# Patient Record
Sex: Female | Born: 1935 | Race: White | Hispanic: No | Smoking: Never smoker
Health system: Southern US, Community
[De-identification: ages and names within clinical notes are randomized; demographics above are authoritative.]

## PROBLEM LIST (undated history)

## (undated) DIAGNOSIS — R5383 Other fatigue: Secondary | ICD-10-CM

## (undated) DIAGNOSIS — A048 Other specified bacterial intestinal infections: Secondary | ICD-10-CM

## (undated) DIAGNOSIS — F411 Generalized anxiety disorder: Secondary | ICD-10-CM

## (undated) DIAGNOSIS — I509 Heart failure, unspecified: Secondary | ICD-10-CM

## (undated) DIAGNOSIS — K222 Esophageal obstruction: Secondary | ICD-10-CM

## (undated) DIAGNOSIS — R198 Other specified symptoms and signs involving the digestive system and abdomen: Secondary | ICD-10-CM

## (undated) DIAGNOSIS — M47816 Spondylosis without myelopathy or radiculopathy, lumbar region: Secondary | ICD-10-CM

## (undated) DIAGNOSIS — E559 Vitamin D deficiency, unspecified: Secondary | ICD-10-CM

## (undated) DIAGNOSIS — T8859XA Other complications of anesthesia, initial encounter: Secondary | ICD-10-CM

## (undated) DIAGNOSIS — F329 Major depressive disorder, single episode, unspecified: Secondary | ICD-10-CM

## (undated) DIAGNOSIS — G479 Sleep disorder, unspecified: Secondary | ICD-10-CM

## (undated) DIAGNOSIS — K8020 Calculus of gallbladder without cholecystitis without obstruction: Secondary | ICD-10-CM

## (undated) DIAGNOSIS — H3530 Unspecified macular degeneration: Secondary | ICD-10-CM

## (undated) DIAGNOSIS — M1990 Unspecified osteoarthritis, unspecified site: Secondary | ICD-10-CM

## (undated) DIAGNOSIS — I6350 Cerebral infarction due to unspecified occlusion or stenosis of unspecified cerebral artery: Secondary | ICD-10-CM

## (undated) DIAGNOSIS — K5730 Diverticulosis of large intestine without perforation or abscess without bleeding: Secondary | ICD-10-CM

## (undated) DIAGNOSIS — M810 Age-related osteoporosis without current pathological fracture: Secondary | ICD-10-CM

## (undated) DIAGNOSIS — R0602 Shortness of breath: Secondary | ICD-10-CM

## (undated) DIAGNOSIS — Z9289 Personal history of other medical treatment: Secondary | ICD-10-CM

## (undated) DIAGNOSIS — R350 Frequency of micturition: Secondary | ICD-10-CM

## (undated) DIAGNOSIS — K644 Residual hemorrhoidal skin tags: Secondary | ICD-10-CM

## (undated) DIAGNOSIS — R112 Nausea with vomiting, unspecified: Secondary | ICD-10-CM

## (undated) DIAGNOSIS — K449 Diaphragmatic hernia without obstruction or gangrene: Secondary | ICD-10-CM

## (undated) DIAGNOSIS — Z9889 Other specified postprocedural states: Secondary | ICD-10-CM

## (undated) DIAGNOSIS — K219 Gastro-esophageal reflux disease without esophagitis: Secondary | ICD-10-CM

## (undated) DIAGNOSIS — R519 Headache, unspecified: Secondary | ICD-10-CM

## (undated) DIAGNOSIS — R002 Palpitations: Secondary | ICD-10-CM

## (undated) DIAGNOSIS — F3289 Other specified depressive episodes: Secondary | ICD-10-CM

## (undated) DIAGNOSIS — T4145XA Adverse effect of unspecified anesthetic, initial encounter: Secondary | ICD-10-CM

## (undated) DIAGNOSIS — Z8489 Family history of other specified conditions: Secondary | ICD-10-CM

## (undated) HISTORY — PX: ESOPHAGEAL DILATION: SHX303

## (undated) HISTORY — DX: Cerebral infarction due to unspecified occlusion or stenosis of unspecified cerebral artery: I63.50

## (undated) HISTORY — DX: Diaphragmatic hernia without obstruction or gangrene: K44.9

## (undated) HISTORY — DX: Personal history of other medical treatment: Z92.89

## (undated) HISTORY — DX: Major depressive disorder, single episode, unspecified: F32.9

## (undated) HISTORY — DX: Other specified depressive episodes: F32.89

## (undated) HISTORY — DX: Esophageal obstruction: K22.2

## (undated) HISTORY — DX: Unspecified osteoarthritis, unspecified site: M19.90

## (undated) HISTORY — PX: TUBAL LIGATION: SHX77

## (undated) HISTORY — PX: APPENDECTOMY: SHX54

## (undated) HISTORY — DX: Diverticulosis of large intestine without perforation or abscess without bleeding: K57.30

## (undated) HISTORY — DX: Palpitations: R00.2

## (undated) HISTORY — DX: Shortness of breath: R06.02

## (undated) HISTORY — DX: Gastro-esophageal reflux disease without esophagitis: K21.9

## (undated) HISTORY — DX: Spondylosis without myelopathy or radiculopathy, lumbar region: M47.816

## (undated) HISTORY — DX: Generalized anxiety disorder: F41.1

## (undated) HISTORY — DX: Vitamin D deficiency, unspecified: E55.9

## (undated) HISTORY — DX: Residual hemorrhoidal skin tags: K64.4

## (undated) HISTORY — DX: Unspecified macular degeneration: H35.30

## (undated) HISTORY — DX: Other specified bacterial intestinal infections: A04.8

---

## 1999-04-28 ENCOUNTER — Encounter: Payer: Self-pay | Admitting: Family Medicine

## 1999-04-28 ENCOUNTER — Encounter: Admission: RE | Admit: 1999-04-28 | Discharge: 1999-04-28 | Payer: Self-pay | Admitting: Family Medicine

## 2000-11-10 ENCOUNTER — Encounter: Admission: RE | Admit: 2000-11-10 | Discharge: 2000-11-10 | Payer: Self-pay | Admitting: Family Medicine

## 2000-11-10 ENCOUNTER — Encounter: Payer: Self-pay | Admitting: Family Medicine

## 2001-08-27 ENCOUNTER — Encounter: Payer: Self-pay | Admitting: Emergency Medicine

## 2001-08-27 ENCOUNTER — Inpatient Hospital Stay (HOSPITAL_COMMUNITY): Admission: EM | Admit: 2001-08-27 | Discharge: 2001-08-28 | Payer: Self-pay | Admitting: Emergency Medicine

## 2001-08-28 ENCOUNTER — Encounter: Payer: Self-pay | Admitting: Internal Medicine

## 2001-10-16 ENCOUNTER — Encounter (INDEPENDENT_AMBULATORY_CARE_PROVIDER_SITE_OTHER): Payer: Self-pay | Admitting: Gastroenterology

## 2001-11-30 ENCOUNTER — Encounter: Payer: Self-pay | Admitting: Family Medicine

## 2001-11-30 ENCOUNTER — Encounter: Admission: RE | Admit: 2001-11-30 | Discharge: 2001-11-30 | Payer: Self-pay | Admitting: Family Medicine

## 2002-01-03 ENCOUNTER — Other Ambulatory Visit: Admission: RE | Admit: 2002-01-03 | Discharge: 2002-01-03 | Payer: Self-pay | Admitting: Family Medicine

## 2002-12-02 ENCOUNTER — Encounter: Admission: RE | Admit: 2002-12-02 | Discharge: 2002-12-02 | Payer: Self-pay | Admitting: Family Medicine

## 2003-06-11 ENCOUNTER — Encounter: Admission: RE | Admit: 2003-06-11 | Discharge: 2003-06-11 | Payer: Self-pay | Admitting: Thoracic Surgery

## 2003-07-09 ENCOUNTER — Encounter: Admission: RE | Admit: 2003-07-09 | Discharge: 2003-07-09 | Payer: Self-pay | Admitting: Thoracic Surgery

## 2003-08-26 ENCOUNTER — Encounter: Admission: RE | Admit: 2003-08-26 | Discharge: 2003-08-26 | Payer: Self-pay | Admitting: Thoracic Surgery

## 2003-11-26 ENCOUNTER — Encounter: Admission: RE | Admit: 2003-11-26 | Discharge: 2003-11-26 | Payer: Self-pay | Admitting: Thoracic Surgery

## 2003-12-02 ENCOUNTER — Encounter: Admission: RE | Admit: 2003-12-02 | Discharge: 2003-12-02 | Payer: Self-pay | Admitting: Family Medicine

## 2003-12-25 ENCOUNTER — Ambulatory Visit: Payer: Self-pay | Admitting: Family Medicine

## 2004-02-13 ENCOUNTER — Ambulatory Visit: Payer: Self-pay | Admitting: Family Medicine

## 2004-02-27 ENCOUNTER — Ambulatory Visit: Payer: Self-pay | Admitting: Family Medicine

## 2004-11-17 ENCOUNTER — Ambulatory Visit: Payer: Self-pay | Admitting: Family Medicine

## 2004-11-18 ENCOUNTER — Ambulatory Visit: Payer: Self-pay | Admitting: Family Medicine

## 2005-01-04 ENCOUNTER — Ambulatory Visit: Payer: Self-pay | Admitting: Family Medicine

## 2005-01-18 ENCOUNTER — Encounter: Admission: RE | Admit: 2005-01-18 | Discharge: 2005-01-18 | Payer: Self-pay | Admitting: Family Medicine

## 2005-02-01 ENCOUNTER — Encounter: Payer: Self-pay | Admitting: Family Medicine

## 2005-02-01 ENCOUNTER — Other Ambulatory Visit: Admission: RE | Admit: 2005-02-01 | Discharge: 2005-02-01 | Payer: Self-pay | Admitting: Family Medicine

## 2005-02-01 ENCOUNTER — Ambulatory Visit: Payer: Self-pay | Admitting: Family Medicine

## 2005-02-01 LAB — CONVERTED CEMR LAB: Pap Smear: NORMAL

## 2005-02-02 ENCOUNTER — Encounter: Admission: RE | Admit: 2005-02-02 | Discharge: 2005-02-02 | Payer: Self-pay | Admitting: Family Medicine

## 2005-02-02 ENCOUNTER — Ambulatory Visit: Payer: Self-pay | Admitting: Gastroenterology

## 2005-03-14 ENCOUNTER — Ambulatory Visit: Payer: Self-pay | Admitting: Family Medicine

## 2005-03-15 ENCOUNTER — Ambulatory Visit: Payer: Self-pay | Admitting: Family Medicine

## 2005-03-23 ENCOUNTER — Encounter: Admission: RE | Admit: 2005-03-23 | Discharge: 2005-03-23 | Payer: Self-pay | Admitting: Family Medicine

## 2005-03-24 ENCOUNTER — Ambulatory Visit: Payer: Self-pay | Admitting: *Deleted

## 2005-04-08 ENCOUNTER — Ambulatory Visit: Payer: Self-pay

## 2005-09-28 ENCOUNTER — Ambulatory Visit: Payer: Self-pay | Admitting: Family Medicine

## 2005-10-24 ENCOUNTER — Ambulatory Visit: Payer: Self-pay | Admitting: Physical Medicine & Rehabilitation

## 2005-10-24 ENCOUNTER — Encounter
Admission: RE | Admit: 2005-10-24 | Discharge: 2006-01-22 | Payer: Self-pay | Admitting: Physical Medicine & Rehabilitation

## 2005-12-19 ENCOUNTER — Ambulatory Visit: Payer: Self-pay | Admitting: Physical Medicine & Rehabilitation

## 2005-12-27 ENCOUNTER — Encounter
Admission: RE | Admit: 2005-12-27 | Discharge: 2006-03-27 | Payer: Self-pay | Admitting: Physical Medicine & Rehabilitation

## 2006-02-10 ENCOUNTER — Ambulatory Visit: Payer: Self-pay | Admitting: Physical Medicine & Rehabilitation

## 2006-02-10 ENCOUNTER — Encounter
Admission: RE | Admit: 2006-02-10 | Discharge: 2006-05-11 | Payer: Self-pay | Admitting: Physical Medicine & Rehabilitation

## 2006-02-14 ENCOUNTER — Encounter: Admission: RE | Admit: 2006-02-14 | Discharge: 2006-02-14 | Payer: Self-pay | Admitting: Family Medicine

## 2006-05-25 ENCOUNTER — Ambulatory Visit: Payer: Self-pay | Admitting: Family Medicine

## 2006-05-25 DIAGNOSIS — R002 Palpitations: Secondary | ICD-10-CM | POA: Insufficient documentation

## 2006-05-26 LAB — CONVERTED CEMR LAB
Basophils Absolute: 0 10*3/uL (ref 0.0–0.1)
Basophils Relative: 0.4 % (ref 0.0–1.0)
MCHC: 34.3 g/dL (ref 30.0–36.0)
Monocytes Relative: 8.2 % (ref 3.0–11.0)
Platelets: 346 10*3/uL (ref 150–400)
RDW: 13 % (ref 11.5–14.6)
TSH: 2.47 microintl units/mL (ref 0.35–5.50)

## 2006-06-06 ENCOUNTER — Encounter: Payer: Self-pay | Admitting: Family Medicine

## 2006-06-06 DIAGNOSIS — H3530 Unspecified macular degeneration: Secondary | ICD-10-CM | POA: Insufficient documentation

## 2006-06-06 DIAGNOSIS — F411 Generalized anxiety disorder: Secondary | ICD-10-CM

## 2006-06-06 DIAGNOSIS — E785 Hyperlipidemia, unspecified: Secondary | ICD-10-CM | POA: Insufficient documentation

## 2006-06-06 DIAGNOSIS — Z8619 Personal history of other infectious and parasitic diseases: Secondary | ICD-10-CM | POA: Insufficient documentation

## 2006-06-06 DIAGNOSIS — Z8673 Personal history of transient ischemic attack (TIA), and cerebral infarction without residual deficits: Secondary | ICD-10-CM

## 2006-06-06 DIAGNOSIS — A048 Other specified bacterial intestinal infections: Secondary | ICD-10-CM

## 2006-06-06 DIAGNOSIS — R071 Chest pain on breathing: Secondary | ICD-10-CM

## 2006-06-06 DIAGNOSIS — F4323 Adjustment disorder with mixed anxiety and depressed mood: Secondary | ICD-10-CM

## 2006-06-06 DIAGNOSIS — M1990 Unspecified osteoarthritis, unspecified site: Secondary | ICD-10-CM | POA: Insufficient documentation

## 2006-06-07 ENCOUNTER — Ambulatory Visit: Payer: Self-pay | Admitting: Family Medicine

## 2006-06-30 ENCOUNTER — Ambulatory Visit: Payer: Self-pay | Admitting: Cardiovascular Disease

## 2006-07-06 ENCOUNTER — Ambulatory Visit: Payer: Self-pay | Admitting: Cardiovascular Disease

## 2006-08-02 ENCOUNTER — Ambulatory Visit: Payer: Self-pay | Admitting: Internal Medicine

## 2006-08-02 LAB — CONVERTED CEMR LAB
Calcium: 9.1 mg/dL (ref 8.4–10.5)
Chloride: 108 meq/L (ref 96–112)
GFR calc Af Amer: 63 mL/min
GFR calc non Af Amer: 52 mL/min
Glucose, Bld: 95 mg/dL (ref 70–99)
Magnesium: 2.1 mg/dL (ref 1.5–2.5)
Sodium: 140 meq/L (ref 135–145)

## 2006-08-08 ENCOUNTER — Ambulatory Visit: Payer: Self-pay | Admitting: Cardiovascular Disease

## 2006-08-08 LAB — CONVERTED CEMR LAB
BUN: 20 mg/dL (ref 6–23)
CO2: 24 meq/L (ref 19–32)
Calcium: 9 mg/dL (ref 8.4–10.5)
GFR calc Af Amer: 63 mL/min
GFR calc non Af Amer: 52 mL/min
Glucose, Bld: 115 mg/dL — ABNORMAL HIGH (ref 70–99)
Potassium: 4 meq/L (ref 3.5–5.1)

## 2006-09-14 ENCOUNTER — Ambulatory Visit: Payer: Self-pay | Admitting: Cardiovascular Disease

## 2006-10-19 ENCOUNTER — Ambulatory Visit: Payer: Self-pay | Admitting: Family Medicine

## 2007-02-19 ENCOUNTER — Encounter: Admission: RE | Admit: 2007-02-19 | Discharge: 2007-02-19 | Payer: Self-pay | Admitting: Family Medicine

## 2007-02-21 ENCOUNTER — Encounter (INDEPENDENT_AMBULATORY_CARE_PROVIDER_SITE_OTHER): Payer: Self-pay | Admitting: *Deleted

## 2007-02-22 ENCOUNTER — Telehealth: Payer: Self-pay | Admitting: Family Medicine

## 2007-03-02 ENCOUNTER — Ambulatory Visit: Payer: Self-pay | Admitting: Family Medicine

## 2007-04-25 ENCOUNTER — Encounter (INDEPENDENT_AMBULATORY_CARE_PROVIDER_SITE_OTHER): Payer: Self-pay | Admitting: *Deleted

## 2007-04-25 ENCOUNTER — Inpatient Hospital Stay (HOSPITAL_COMMUNITY): Admission: EM | Admit: 2007-04-25 | Discharge: 2007-04-26 | Payer: Self-pay | Admitting: Emergency Medicine

## 2007-04-26 ENCOUNTER — Encounter (INDEPENDENT_AMBULATORY_CARE_PROVIDER_SITE_OTHER): Payer: Self-pay | Admitting: *Deleted

## 2007-04-26 ENCOUNTER — Telehealth: Payer: Self-pay | Admitting: Family Medicine

## 2007-04-26 ENCOUNTER — Encounter: Payer: Self-pay | Admitting: Family Medicine

## 2007-06-13 ENCOUNTER — Ambulatory Visit: Payer: Self-pay | Admitting: Family Medicine

## 2007-06-13 DIAGNOSIS — M818 Other osteoporosis without current pathological fracture: Secondary | ICD-10-CM

## 2007-06-13 DIAGNOSIS — R5383 Other fatigue: Secondary | ICD-10-CM

## 2007-06-18 ENCOUNTER — Encounter (INDEPENDENT_AMBULATORY_CARE_PROVIDER_SITE_OTHER): Payer: Self-pay | Admitting: *Deleted

## 2007-06-18 LAB — CONVERTED CEMR LAB
ALT: 18 units/L (ref 0–35)
Albumin: 4.3 g/dL (ref 3.5–5.2)
BUN: 17 mg/dL (ref 6–23)
Basophils Relative: 1.8 % — ABNORMAL HIGH (ref 0.0–1.0)
Bilirubin, Direct: 0.1 mg/dL (ref 0.0–0.3)
Chloride: 102 meq/L (ref 96–112)
Eosinophils Absolute: 0.2 10*3/uL (ref 0.0–0.7)
Eosinophils Relative: 3.7 % (ref 0.0–5.0)
GFR calc Af Amer: 80 mL/min
GFR calc non Af Amer: 66 mL/min
HCT: 42.5 % (ref 36.0–46.0)
HDL: 59.3 mg/dL (ref >39.0)
Hemoglobin: 14.1 g/dL (ref 12.0–15.0)
MCV: 86.8 fL (ref 78.0–100.0)
Monocytes Absolute: 0.4 10*3/uL (ref 0.1–1.0)
Monocytes Relative: 7.9 % (ref 3.0–12.0)
Neutro Abs: 2.1 10*3/uL (ref 1.4–7.7)
Phosphorus: 3.9 mg/dL (ref 2.3–4.6)
Potassium: 4.1 meq/L (ref 3.5–5.1)
RBC: 4.9 M/uL (ref 3.87–5.11)
Sodium: 138 meq/L (ref 135–145)
Total CHOL/HDL Ratio: 2.7
Total Protein: 7.7 g/dL (ref 6.0–8.3)
Vit D, 1,25-Dihydroxy: 27 — ABNORMAL LOW (ref 30–89)
WBC: 5 10*3/uL (ref 4.5–10.5)

## 2007-06-19 ENCOUNTER — Encounter: Payer: Self-pay | Admitting: Family Medicine

## 2007-06-19 ENCOUNTER — Ambulatory Visit: Payer: Self-pay | Admitting: Internal Medicine

## 2007-07-03 ENCOUNTER — Ambulatory Visit: Payer: Self-pay | Admitting: Family Medicine

## 2007-07-03 DIAGNOSIS — E559 Vitamin D deficiency, unspecified: Secondary | ICD-10-CM | POA: Insufficient documentation

## 2007-07-05 ENCOUNTER — Ambulatory Visit: Payer: Self-pay | Admitting: Family Medicine

## 2007-07-06 ENCOUNTER — Encounter (INDEPENDENT_AMBULATORY_CARE_PROVIDER_SITE_OTHER): Payer: Self-pay | Admitting: *Deleted

## 2007-07-06 LAB — FECAL OCCULT BLOOD, GUAIAC: Fecal Occult Blood: NEGATIVE

## 2007-08-09 ENCOUNTER — Ambulatory Visit: Payer: Self-pay | Admitting: Family Medicine

## 2007-08-13 LAB — CONVERTED CEMR LAB: Vit D, 1,25-Dihydroxy: 42 (ref 30–89)

## 2007-09-19 ENCOUNTER — Ambulatory Visit: Payer: Self-pay | Admitting: Family Medicine

## 2007-09-19 DIAGNOSIS — R0602 Shortness of breath: Secondary | ICD-10-CM | POA: Insufficient documentation

## 2007-09-21 ENCOUNTER — Ambulatory Visit: Payer: Self-pay | Admitting: Cardiovascular Disease

## 2007-10-05 ENCOUNTER — Encounter: Payer: Self-pay | Admitting: Family Medicine

## 2007-10-05 ENCOUNTER — Ambulatory Visit: Payer: Self-pay

## 2007-10-15 ENCOUNTER — Ambulatory Visit: Payer: Self-pay | Admitting: Family Medicine

## 2008-01-29 ENCOUNTER — Ambulatory Visit: Payer: Self-pay | Admitting: Family Medicine

## 2008-01-29 DIAGNOSIS — R197 Diarrhea, unspecified: Secondary | ICD-10-CM

## 2008-01-31 ENCOUNTER — Encounter: Payer: Self-pay | Admitting: Family Medicine

## 2008-02-01 DIAGNOSIS — K5730 Diverticulosis of large intestine without perforation or abscess without bleeding: Secondary | ICD-10-CM | POA: Insufficient documentation

## 2008-02-01 DIAGNOSIS — K449 Diaphragmatic hernia without obstruction or gangrene: Secondary | ICD-10-CM | POA: Insufficient documentation

## 2008-02-01 DIAGNOSIS — Z8719 Personal history of other diseases of the digestive system: Secondary | ICD-10-CM | POA: Insufficient documentation

## 2008-02-01 DIAGNOSIS — K644 Residual hemorrhoidal skin tags: Secondary | ICD-10-CM | POA: Insufficient documentation

## 2008-02-06 ENCOUNTER — Ambulatory Visit: Payer: Self-pay | Admitting: Internal Medicine

## 2008-03-19 ENCOUNTER — Telehealth: Payer: Self-pay | Admitting: Family Medicine

## 2008-04-09 ENCOUNTER — Encounter: Admission: RE | Admit: 2008-04-09 | Discharge: 2008-04-09 | Payer: Self-pay | Admitting: Family Medicine

## 2008-04-10 ENCOUNTER — Encounter (INDEPENDENT_AMBULATORY_CARE_PROVIDER_SITE_OTHER): Payer: Self-pay | Admitting: *Deleted

## 2008-04-25 ENCOUNTER — Emergency Department (HOSPITAL_COMMUNITY): Admission: EM | Admit: 2008-04-25 | Discharge: 2008-04-25 | Payer: Self-pay | Admitting: Family Medicine

## 2008-05-21 ENCOUNTER — Telehealth: Payer: Self-pay | Admitting: Family Medicine

## 2008-06-25 ENCOUNTER — Telehealth: Payer: Self-pay | Admitting: Family Medicine

## 2008-06-26 ENCOUNTER — Ambulatory Visit: Payer: Self-pay | Admitting: Family Medicine

## 2008-06-26 DIAGNOSIS — M129 Arthropathy, unspecified: Secondary | ICD-10-CM

## 2008-06-29 LAB — CONVERTED CEMR LAB: Anti Nuclear Antibody(ANA): NEGATIVE

## 2008-07-02 ENCOUNTER — Telehealth: Payer: Self-pay | Admitting: Family Medicine

## 2008-07-02 LAB — CONVERTED CEMR LAB
Albumin: 4.1 g/dL (ref 3.5–5.2)
Alkaline Phosphatase: 87 units/L (ref 39–117)
BUN: 15 mg/dL (ref 6–23)
Basophils Absolute: 0 10*3/uL (ref 0.0–0.1)
Basophils Relative: 0.2 % (ref 0.0–3.0)
Bilirubin, Direct: 0.2 mg/dL (ref 0.0–0.3)
CO2: 30 meq/L (ref 19–32)
Calcium: 9.3 mg/dL (ref 8.4–10.5)
Cholesterol: 163 mg/dL (ref 0–200)
Creatinine, Ser: 0.9 mg/dL (ref 0.4–1.2)
Eosinophils Absolute: 0.3 10*3/uL (ref 0.0–0.7)
Glucose, Bld: 98 mg/dL (ref 70–99)
HDL: 65 mg/dL (ref >39.00)
Lymphocytes Relative: 40.9 % (ref 12.0–46.0)
MCHC: 34.4 g/dL (ref 30.0–36.0)
MCV: 87.6 fL (ref 78.0–100.0)
Monocytes Absolute: 0.5 10*3/uL (ref 0.1–1.0)
Neutrophils Relative %: 41.6 % — ABNORMAL LOW (ref 43.0–77.0)
RDW: 13.4 % (ref 11.5–14.6)
Rhuematoid fact SerPl-aCnc: <20 intl units/mL (ref 0.0–20.0)
TSH: 2.33 microintl units/mL (ref 0.35–5.50)
Total CK: 49 units/L (ref 7–177)
Total Protein: 7.3 g/dL (ref 6.0–8.3)
Triglycerides: 114 mg/dL (ref 0.0–149.0)
VLDL: 22.8 mg/dL (ref 0.0–40.0)

## 2008-07-15 ENCOUNTER — Telehealth: Payer: Self-pay | Admitting: Family Medicine

## 2008-08-01 ENCOUNTER — Encounter: Payer: Self-pay | Admitting: Family Medicine

## 2008-08-07 ENCOUNTER — Encounter: Admission: RE | Admit: 2008-08-07 | Discharge: 2008-08-07 | Payer: Self-pay | Admitting: Rheumatology

## 2008-09-09 ENCOUNTER — Encounter: Payer: Self-pay | Admitting: Family Medicine

## 2008-10-15 DIAGNOSIS — Z8679 Personal history of other diseases of the circulatory system: Secondary | ICD-10-CM | POA: Insufficient documentation

## 2008-10-15 DIAGNOSIS — K219 Gastro-esophageal reflux disease without esophagitis: Secondary | ICD-10-CM | POA: Insufficient documentation

## 2008-10-16 ENCOUNTER — Encounter: Payer: Self-pay | Admitting: Cardiovascular Disease

## 2008-11-11 ENCOUNTER — Telehealth: Payer: Self-pay | Admitting: Family Medicine

## 2008-12-01 ENCOUNTER — Ambulatory Visit: Payer: Self-pay | Admitting: Internal Medicine

## 2008-12-03 ENCOUNTER — Ambulatory Visit (HOSPITAL_COMMUNITY): Admission: RE | Admit: 2008-12-03 | Discharge: 2008-12-03 | Payer: Self-pay | Admitting: Internal Medicine

## 2008-12-05 ENCOUNTER — Telehealth (INDEPENDENT_AMBULATORY_CARE_PROVIDER_SITE_OTHER): Payer: Self-pay | Admitting: *Deleted

## 2008-12-19 ENCOUNTER — Telehealth: Payer: Self-pay | Admitting: Internal Medicine

## 2008-12-19 ENCOUNTER — Telehealth: Payer: Self-pay | Admitting: Family Medicine

## 2008-12-22 ENCOUNTER — Ambulatory Visit: Payer: Self-pay | Admitting: Internal Medicine

## 2008-12-22 LAB — HM COLONOSCOPY

## 2008-12-25 ENCOUNTER — Encounter: Payer: Self-pay | Admitting: Internal Medicine

## 2008-12-28 ENCOUNTER — Encounter: Admission: RE | Admit: 2008-12-28 | Discharge: 2008-12-28 | Payer: Self-pay | Admitting: Orthopedic Surgery

## 2009-01-01 ENCOUNTER — Telehealth: Payer: Self-pay | Admitting: Family Medicine

## 2009-01-06 ENCOUNTER — Encounter: Admission: RE | Admit: 2009-01-06 | Discharge: 2009-01-06 | Payer: Self-pay | Admitting: Orthopedic Surgery

## 2009-01-19 ENCOUNTER — Ambulatory Visit: Payer: Self-pay | Admitting: Internal Medicine

## 2009-03-16 ENCOUNTER — Encounter: Payer: Self-pay | Admitting: Family Medicine

## 2009-03-27 ENCOUNTER — Encounter: Admission: RE | Admit: 2009-03-27 | Discharge: 2009-03-27 | Payer: Self-pay | Admitting: Neurosurgery

## 2009-03-27 ENCOUNTER — Encounter: Payer: Self-pay | Admitting: Family Medicine

## 2009-04-02 ENCOUNTER — Telehealth: Payer: Self-pay | Admitting: Family Medicine

## 2009-04-13 ENCOUNTER — Telehealth: Payer: Self-pay | Admitting: Family Medicine

## 2009-04-13 ENCOUNTER — Ambulatory Visit: Payer: Self-pay | Admitting: Family Medicine

## 2009-04-13 DIAGNOSIS — M5136 Other intervertebral disc degeneration, lumbar region: Secondary | ICD-10-CM

## 2009-04-14 ENCOUNTER — Encounter: Admission: RE | Admit: 2009-04-14 | Discharge: 2009-04-14 | Payer: Self-pay | Admitting: Family Medicine

## 2009-04-16 ENCOUNTER — Encounter (INDEPENDENT_AMBULATORY_CARE_PROVIDER_SITE_OTHER): Payer: Self-pay | Admitting: *Deleted

## 2009-04-21 ENCOUNTER — Encounter: Payer: Self-pay | Admitting: Family Medicine

## 2009-04-21 ENCOUNTER — Encounter: Payer: Self-pay | Admitting: Cardiovascular Disease

## 2009-04-22 ENCOUNTER — Telehealth: Payer: Self-pay | Admitting: Family Medicine

## 2009-04-27 ENCOUNTER — Telehealth: Payer: Self-pay | Admitting: Family Medicine

## 2009-04-30 ENCOUNTER — Telehealth: Payer: Self-pay | Admitting: Family Medicine

## 2009-05-05 ENCOUNTER — Ambulatory Visit: Payer: Self-pay | Admitting: Cardiovascular Disease

## 2009-05-11 ENCOUNTER — Ambulatory Visit: Payer: Self-pay | Admitting: Family Medicine

## 2009-05-11 LAB — CONVERTED CEMR LAB
OCCULT 1: NEGATIVE
OCCULT 2: NEGATIVE

## 2009-05-29 ENCOUNTER — Inpatient Hospital Stay (HOSPITAL_COMMUNITY): Admission: RE | Admit: 2009-05-29 | Discharge: 2009-06-05 | Payer: Self-pay | Admitting: Neurosurgery

## 2009-06-10 ENCOUNTER — Encounter: Payer: Self-pay | Admitting: Family Medicine

## 2009-06-19 ENCOUNTER — Telehealth: Payer: Self-pay | Admitting: Family Medicine

## 2009-07-21 ENCOUNTER — Encounter: Payer: Self-pay | Admitting: Family Medicine

## 2009-07-27 ENCOUNTER — Encounter: Admission: RE | Admit: 2009-07-27 | Discharge: 2009-07-27 | Payer: Self-pay | Admitting: Neurosurgery

## 2009-10-07 ENCOUNTER — Encounter: Payer: Self-pay | Admitting: Family Medicine

## 2009-10-07 ENCOUNTER — Ambulatory Visit: Payer: Self-pay | Admitting: Internal Medicine

## 2009-10-20 ENCOUNTER — Encounter: Admission: RE | Admit: 2009-10-20 | Discharge: 2009-10-20 | Payer: Self-pay | Admitting: Neurosurgery

## 2009-11-04 ENCOUNTER — Encounter: Payer: Self-pay | Admitting: Family Medicine

## 2009-11-06 ENCOUNTER — Ambulatory Visit: Payer: Self-pay | Admitting: Family Medicine

## 2009-11-09 LAB — CONVERTED CEMR LAB
Alkaline Phosphatase: 78 units/L (ref 39–117)
BUN: 14 mg/dL (ref 6–23)
Bilirubin, Direct: 0.1 mg/dL (ref 0.0–0.3)
Chloride: 102 meq/L (ref 96–112)
Creatinine, Ser: 0.87 mg/dL (ref 0.40–1.20)
Glucose, Bld: 73 mg/dL (ref 70–99)
Indirect Bilirubin: 0.3 mg/dL (ref 0.0–0.9)
LDL Cholesterol: 71 mg/dL (ref 0–99)
Potassium: 5.6 meq/L — ABNORMAL HIGH (ref 3.5–5.3)
Triglycerides: 154 mg/dL — ABNORMAL HIGH (ref <150)
VLDL: 31 mg/dL (ref 0–40)

## 2009-11-10 ENCOUNTER — Telehealth: Payer: Self-pay | Admitting: Family Medicine

## 2009-11-13 ENCOUNTER — Ambulatory Visit: Payer: Self-pay | Admitting: Family Medicine

## 2009-11-17 LAB — CONVERTED CEMR LAB: Potassium: 4.5 meq/L (ref 3.5–5.1)

## 2009-12-23 ENCOUNTER — Encounter: Payer: Self-pay | Admitting: Family Medicine

## 2009-12-30 ENCOUNTER — Encounter
Admission: RE | Admit: 2009-12-30 | Discharge: 2009-12-30 | Payer: Self-pay | Source: Home / Self Care | Attending: Neurosurgery | Admitting: Neurosurgery

## 2009-12-31 ENCOUNTER — Telehealth: Payer: Self-pay | Admitting: Family Medicine

## 2010-01-21 ENCOUNTER — Telehealth: Payer: Self-pay | Admitting: Family Medicine

## 2010-02-01 ENCOUNTER — Telehealth: Payer: Self-pay | Admitting: Family Medicine

## 2010-02-07 ENCOUNTER — Encounter: Payer: Self-pay | Admitting: Family Medicine

## 2010-02-14 LAB — CONVERTED CEMR LAB
Basophils Absolute: 0 10*3/uL (ref 0.0–0.1)
CO2: 28 meq/L (ref 19–32)
Chloride: 110 meq/L (ref 96–112)
Creatinine, Ser: 0.9 mg/dL (ref 0.4–1.2)
Eosinophils Relative: 3.3 % (ref 0.0–5.0)
GFR calc non Af Amer: 66 mL/min
HCT: 40 % (ref 36.0–46.0)
Hemoglobin: 13.6 g/dL (ref 12.0–15.0)
MCV: 86 fL (ref 78.0–100.0)
Monocytes Absolute: 0.5 10*3/uL (ref 0.1–1.0)
Monocytes Relative: 8.5 % (ref 3.0–12.0)
Neutro Abs: 1.9 10*3/uL (ref 1.4–7.7)
Potassium: 3.9 meq/L (ref 3.5–5.1)
Pro B Natriuretic peptide (BNP): 176 pg/mL — ABNORMAL HIGH (ref 0.0–100.0)
RDW: 13.6 % (ref 11.5–14.6)

## 2010-02-18 NOTE — Letter (Signed)
Summary: [facility]  [facility]   Imported By: Lanelle Bal 01/06/2010 08:09:49  _____________________________________________________________________  External Attachment:    Type:   Image     Comment:   External Document

## 2010-02-18 NOTE — Assessment & Plan Note (Signed)
Summary: lumber surgery   Visit Type:  Pre-op Evaluation Primary Provider:  Roxy Manns, MD   History of Present Illness: This 76 year old woman with history of chest pain, palpitations, and previous TIA. She had an adenosine Myoview stress scan in 2009 for further evaluation of chest pain. This was normal with no ischemia and normal LV function. She had an echo at the time of her TIA which was also normal.  The patient is scheduled for L4-L5 disk surgery in May. She is having severe pain and numbness in the left leg.   The patient denies chest pain, orthopnea, PND, edema, palpitations, lightheadedness, or syncope.  Complains of mild dyspnea with exertion but attributes this to being out of shape.  The pt has been on plavix for the past 2 years, since she had a TIA. She will need to be off of this for surgery.   Current Medications (verified): 1)  Zoloft 100 Mg Tabs (Sertraline Hcl) .... 2 By Mouth Daily 2)  Lipitor 10 Mg Tabs (Atorvastatin Calcium) .Marland Kitchen.. 1 By Mouth Daily 3)  Nexium 40 Mg Cpdr (Esomeprazole Magnesium) .... Take 1 Tablet By Mouth Two Times A Day 4)  Tylenol Arthritis Pain 650 Mg Tbcr (Acetaminophen) .Marland Kitchen.. 1 By Mouth As Needed 5)  Plavix 75 Mg  Tabs (Clopidogrel Bisulfate) .... Take One By Mouth Daily 6)  Alprazolam 0.5 Mg  Tabs (Alprazolam) .... As Needed 7)  Macular Protect Complete .Marland KitchenMarland Kitchen. 1 A Day 8)  Tylenol Sinus Congestion/pain 5-325-200 Mg Tabs (Phenylephrine-Apap-Guaifenesin) .... As Needed 9)  Viactiv 500-500-40 Mg-Unt-Mcg Chew (Calcium-Vitamin D-Vitamin K) .... Two  Times A Day 10)  Hydrocodone-Acetaminophen 5-500 Mg Tabs (Hydrocodone-Acetaminophen) .... Take One Every Four Hours As Needed For Pain  Allergies (verified): 1)  ! Sulfa 2)  ! * Latex 3)  * Fosamax 4)  * Celebrex 5)  * Percocet  Past History:  Past medical history reviewed for relevance to current acute and chronic problems.  Past Medical History: Reviewed history from 12/19/2008 and no changes  required. Current Problems:  Hx of CHEST WALL PAIN (ICD-786.52) CEREBROVASCULAR ACCIDENT, HX OF (ICD-V12.50) PALPITATIONS (ICD-785.1) DYSPNEA (ICD-786.05) HYPERLIPIDEMIA (ICD-272.4) GERD (ICD-530.81) DEPRESSION (ICD-311) ANXIETY (ICD-300.00) Hx of CVA (ICD-434.91) ARTHRITIS, GENERALIZED (ICD-716.99) ARTHRITIS, GENERALIZED (ICD-716.99) GASTRITIS, HX OF (ICD-V12.79) HIATAL HERNIA (ICD-553.3) EXTERNAL HEMORRHOIDS (ICD-455.3) DIVERTICULOSIS OF COLON (ICD-562.10) DIARRHEA (ICD-787.91) SPECIAL SCREENING MALIG NEOPLASMS OTHER SITES (ICD-V76.49) UNSPECIFIED VITAMIN D DEFICIENCY (ICD-268.9) FATIGUE (ICD-780.79) OSTEOPENIA (ICD-733.90) Hx of HELICOBACTER PYLORI INFECTION (ICD-041.86) Hx of MACULAR DEGENERATION (ICD-362.50) OSTEOARTHRITIS (ICD-715.90) LS pain with deg disc (12/10)  ortho (PA) Leilani Able  Review of Systems       Negative except as per HPI   Vital Signs:  Patient profile:   76 year old female Height:      63 inches Weight:      207 pounds BMI:     36.80 Pulse rate:   63 / minute Resp:     16 per minute BP sitting:   120 / 82  (left arm)  Vitals Entered By: Laurance Flatten CMA (May 05, 2009 3:26 PM)  Physical Exam  General:  Pt is alert and oriented, in no acute distress. HEENT: normal Neck: normal carotid upstrokes without bruits, JVP normal Lungs: CTA CV: RRR without murmur or gallop Abd: soft, NT, positive BS, no bruit, no organomegaly Ext: no clubbing, cyanosis, or edema. peripheral pulses 2+ and equal Skin: warm and dry without rash    EKG  Procedure date:  05/05/2009  Findings:  NSR, within normal limits, HR 63 bpm.  Impression & Recommendations:  Problem # 1:  PALPITATIONS (ICD-785.1) The patient does not have structural heart disease. Her palpitations are quite absent at present. Recommend continue observation.  Her updated medication list for this problem includes:    Plavix 75 Mg Tabs (Clopidogrel bisulfate) .Marland Kitchen... Take one by  mouth daily  Orders: EKG w/ Interpretation (93000)  Problem # 2:  HYPERLIPIDEMIA (ICD-272.4) Excellent lipid panel as below. LDL is 75 mg per deciliter. Followed by Dr. Milinda Antis.  Her updated medication list for this problem includes:    Lipitor 10 Mg Tabs (Atorvastatin calcium) .Marland Kitchen... 1 by mouth daily  CHOL: 163 (06/26/2008)   LDL: 75 (06/26/2008)   HDL: 65.00 (06/26/2008)   TG: 114.0 (06/26/2008)  Problem # 3:  PREOPERATIVE EXAMINATION (ICD-V72.84) The patient is at low risk of cardiovascular complications from her upcoming surgery.The patient had a negative stress Myoview scan in 2009. She is having no ischemic symptoms. She can proceed without further testing.I reviewed the records from her hospitalization when she presented with a TIA in 2009. There was no obvious source. Her cardiac echo was normal and her carotids are widely patent.  I would suspect her risk of recurrent TIA or stroke over a short period of time off of Plavix is very low. She should resume Plavix as soon as it is safe from a postoperative standpoint.  Patient Instructions: 1)  Your physician wants you to follow-up in:   1 YEAR. You will receive a reminder letter in the mail two months in advance. If you don't receive a letter, please call our office to schedule the follow-up appointment. 2)  You are cleared for surgery from a cardiac standpoint.

## 2010-02-18 NOTE — Miscellaneous (Signed)
Summary: Controlled Substances Contract  Controlled Substances Contract   Imported By: [name] [name] 11/13/2009 10:07:46  _____________________________________________________________________  External Attachment:    Type:   Image     Comment:   External Document

## 2010-02-18 NOTE — Letter (Signed)
Summary: [facility]  [facility]   Imported By: Lanelle Bal 03/25/2009 08:13:13  _____________________________________________________________________  External Attachment:    Type:   Image     Comment:   External Document

## 2010-02-18 NOTE — Assessment & Plan Note (Signed)
Summary: F/U FROM COLON AND DISCUSS GALLSTONES        [name] [name]   History of Present Illness Visit Type: Follow-up Visit Primary GI MD: Lina Sar MD Primary Provider: Roxy Manns, MD Requesting Provider: na Chief Complaint: Dysphagia, constant GERD with no relief from medication History of Present Illness:   This is a 76 year old white female with irritable bowel syndrome controlled on Bentyl 20 mg once a day. She is status post colonoscopy for diarrhea and weight loss as well as fecal urgency. She was found to have 2 hyperplastic polyps. There was no evidence of microscopic colitis. A prior colonoscopy was done in 2003. An upper abdominal ultrasound to evaluate dyspepsia showed numerous calcified gallstones with a normal gallbladder wall and a normal common bile duct of 4 mm. She continues to have dyspepsia and reflux symptoms on Nexium 40 mg daily. There is strong family history of gallbladder disease in her mother, sister and brother. She has been on Plavix long term because of a prior history of TIA.   GI Review of Systems    Reports acid reflux, dysphagia with solids, and  heartburn.      Denies abdominal pain, belching, bloating, chest pain, dysphagia with liquids, loss of appetite, nausea, vomiting, vomiting blood, weight loss, and  weight gain.      Reports irritable bowel syndrome.     Denies anal fissure, black tarry stools, change in bowel habit, constipation, diarrhea, diverticulosis, fecal incontinence, heme positive stool, hemorrhoids, jaundice, light color stool, liver problems, rectal bleeding, and  rectal pain. Visit Type:  Follow-up Visit Primary Care Provider:  Roxy Manns, MD  Chief Complaint:  Dysphagia and constant GERD with no relief from medication.  History of Present Illness:      Current Medications (verified): 1)  Zoloft 100 Mg Tabs (Sertraline Hcl) .... 2 By Mouth Daily 2)  Lipitor 10 Mg Tabs (Atorvastatin Calcium) .Marland Kitchen.. 1 By Mouth Daily 3)  Nexium 40 Mg  Cpdr (Esomeprazole Magnesium) .Marland Kitchen.. 1 By Mouth Daily 4)  Tylenol Arthritis Pain 650 Mg Tbcr (Acetaminophen) .Marland Kitchen.. 1 By Mouth As Needed 5)  Plavix 75 Mg  Tabs (Clopidogrel Bisulfate) .... Take One By Mouth Daily 6)  Alprazolam 0.5 Mg  Tabs (Alprazolam) .... As Needed 7)  Macular Protect Complete .Marland Kitchen.. 2 A Day 8)  Tylenol Sinus Congestion/pain 5-325-200 Mg Tabs (Phenylephrine-Apap-Guaifenesin) .... As Needed 9)  Viactiv 500-500-40 Mg-Unt-Mcg Chew (Calcium-Vitamin D-Vitamin K) .... Three Times A Day 10)  Bentyl 20 Mg  Tabs (Dicyclomine Hcl) .... One P.o. Q A.m. For Ibs  Allergies (verified): 1)  ! Sulfa 2)  ! * Latex 3)  * Fosamax 4)  * Celebrex 5)  * Percocet  Past History:  Past Medical History: Reviewed history from 12/19/2008 and no changes required. Current Problems:  Hx of CHEST WALL PAIN (ICD-786.52) CEREBROVASCULAR ACCIDENT, HX OF (ICD-V12.50) PALPITATIONS (ICD-785.1) DYSPNEA (ICD-786.05) HYPERLIPIDEMIA (ICD-272.4) GERD (ICD-530.81) DEPRESSION (ICD-311) ANXIETY (ICD-300.00) Hx of CVA (ICD-434.91) ARTHRITIS, GENERALIZED (ICD-716.99) ARTHRITIS, GENERALIZED (ICD-716.99) GASTRITIS, HX OF (ICD-V12.79) HIATAL HERNIA (ICD-553.3) EXTERNAL HEMORRHOIDS (ICD-455.3) DIVERTICULOSIS OF COLON (ICD-562.10) DIARRHEA (ICD-787.91) SPECIAL SCREENING MALIG NEOPLASMS OTHER SITES (ICD-V76.49) UNSPECIFIED VITAMIN D DEFICIENCY (ICD-268.9) FATIGUE (ICD-780.79) OSTEOPENIA (ICD-733.90) Hx of HELICOBACTER PYLORI INFECTION (ICD-041.86) Hx of MACULAR DEGENERATION (ICD-362.50) OSTEOARTHRITIS (ICD-715.90) LS pain with deg disc (12/10)  ortho (PA) Leilani Able  Past Surgical History: Reviewed history from 10/15/2008 and no changes required. Appendectomy Cataract extraction- right knee surgery Hysterectomy- partial for bleeding Tubal ligation Cardiolite stress only (1997) Carotid dopplers- o.k. (01/2000) Dexa-  mild osteopenia femoral neck (02/2000) Admit- r/o MI,  negative cardiolite.   Anxiety (08/2001) Colonoscopy- divertics, hemorrhoids (10/2001)- recommended 5 year follow up colonosc  EGD- HH, gastritis (10/2001) Carotid dopplers- no change (02/2003) hosp admission for TIA 4/09  9/09 stress cardiolite neg  Family History: Reviewed history from 10/15/2008 and no changes required. daughter died pancreatic cancer brother CAD, carotid disease M, B hyperlipidemia Family History of Diabetes: Sister, brother Family History of Heart Disease: Father, Brother Family History of Ovarian Cancer: Daughter  Social History: Reviewed history from 10/15/2008 and no changes required. 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         Pertinent positive and negative review of systems were noted in the above HPI. All other ROS was otherwise negative.   Vital Signs:  Patient profile:   76 year old female Height:      63 inches Weight:      207 pounds BMI:     36.80 Pulse rate:   68 / minute Pulse rhythm:   regular BP sitting:   104 / 66  (left arm) Cuff size:   regular  Vitals Entered By: June McMurray CMA Duncan Dull) (January 19, 2009 4:13 PM)   Impression & Recommendations:  Problem # 1:  FLATULENCE ERUCTATION AND GAS PAIN (ICD-787.3) Patient's symptoms have been up to this point attributed to irritable bowel syndrome but with findings of cholelithiasis one wonders if her symptoms are due to biliary dysfunction. She had over the Christmas holidays  gained about 8 pounds due to going off her diet.. She blames her indigestion to dietary indiscretions. We will increase her Nexium to 40 mg twice a day.  Problem # 2:   CEREBROVASCULAR ACCIDENT, HX OF (ICD-V12.50) Patient is on Plavix 75 mg daily.  Problem # 3:  GERD (ICD-530.81) Patient has reflux and is on Nexium 40 mg daily. We will increase to 40 mg twice a day. If no improvement, I will consider scheduling a HIDA scan.  Patient Instructions: 1)  low-fat diet. 2)  Increase Nexium to 40 mg p.o. b.i.d. samples given. 3)  If no improvement in the next 6-8 weeks consider HIDA scan. 4)  Continue Bentyl 20 mg q.a.m. 5)  Copy sent to : Dr Judie Petit.[facility] Prescriptions: NEXIUM 40 MG CPDR (ESOMEPRAZOLE MAGNESIUM) Take 1 tablet by mouth two times a day  #60 x 3   Entered by:   Hortense Ramal CMA (AAMA)   Authorized by:   Hart Carwin MD   Signed by:   Hortense Ramal CMA (AAMA) on 01/19/2009   Method used:   [facility]        [facility]  [address] [phone][facility] (retail[facility]       [address]       [place], [place]  [address]       Ph: [phone]       Fax: [phone]   RxID:   [id number]

## 2010-02-18 NOTE — Assessment & Plan Note (Signed)
Summary: FOLLOW UP AFTER BONE DENSITY/RI   Vital Signs:  Patient profile:   76 year old female Height:      63 inches Weight:      209 pounds BMI:     37.16 Temp:     97.4 degrees F oral Pulse rate:   84 / minute Pulse rhythm:   regular BP sitting:   116 / 74  (left arm) Cuff size:   regular  Vitals Entered By: Lewanda Rife LPN (November 06, 2009 2:44 PM) CC: follow-up visit after bone density   History of Present Illness: here for f/u of osteopenia   has not been well -- in severe pain -- sees Dr Channing Mutters - had recent CT  needs 2 more screws for a fusion  was told that her bones are in poor shape   had jaw pain with fosamax   has hot flashes already -- discussing use of evista    is on chronic - cannot stop it due to gi symptoms   LS T score ok at -0.1 but FN worse at -1.6  fx hx--no fam hx of OP   never smoked   has hx of low vit D  last level 33 in 6.10  exercise -- she does exercise with 5 lb weights in chair   ca and vit d-- is on viactiv   had jaw pain from fosamax   Allergies: 1)  ! Sulfa 2)  ! * Latex 3)  * Fosamax 4)  * Celebrex 5)  * Percocet  Past History:  Past Medical History: Last updated: 12/19/2008 Current Problems:  Hx of CHEST WALL PAIN (ICD-786.52) CEREBROVASCULAR ACCIDENT, HX OF (ICD-V12.50) PALPITATIONS (ICD-785.1) DYSPNEA (ICD-786.05) HYPERLIPIDEMIA (ICD-272.4) GERD (ICD-530.81) DEPRESSION (ICD-311) ANXIETY (ICD-300.00) Hx of CVA (ICD-434.91) ARTHRITIS, GENERALIZED (ICD-716.99) ARTHRITIS, GENERALIZED (ICD-716.99) GASTRITIS, HX OF (ICD-V12.79) HIATAL HERNIA (ICD-553.3) EXTERNAL HEMORRHOIDS (ICD-455.3) DIVERTICULOSIS OF COLON (ICD-562.10) DIARRHEA (ICD-787.91) SPECIAL SCREENING MALIG NEOPLASMS OTHER SITES (ICD-V76.49) UNSPECIFIED VITAMIN D DEFICIENCY (ICD-268.9) FATIGUE (ICD-780.79) OSTEOPENIA (ICD-733.90) Hx of HELICOBACTER PYLORI INFECTION (ICD-041.86) Hx of MACULAR DEGENERATION (ICD-362.50) OSTEOARTHRITIS  (ICD-715.90) LS pain with deg disc (12/10)  ortho (PA) Leilani Able  Past Surgical History: Last updated: 2008-10-22 Appendectomy Cataract extraction- right knee surgery Hysterectomy- partial for bleeding Tubal ligation Cardiolite stress only (1997) Carotid dopplers- o.k. (01/2000) Dexa- mild osteopenia femoral neck (02/2000) Admit- r/o MI,  negative cardiolite.  Anxiety (08/2001) Colonoscopy- divertics, hemorrhoids (10/2001)- recommended 5 year follow up colonosc  EGD- HH, gastritis (10/2001) Carotid dopplers- no change (02/2003) hosp admission for TIA 4/09  9/09 stress cardiolite neg  Family History: Last updated: October 22, 2008 daughter died pancreatic cancer brother CAD, carotid disease M, B hyperlipidemia Family History of Diabetes: Sister, brother Family History of Heart Disease: Father, Brother Family History of Ovarian Cancer: Daughter  Social History: Last updated: 10/22/2008 Never Smoked pt is on disability since CVA for cognitive problems  (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 Eyes:  Denies blurring. CV:  Denies chest pain or discomfort, lightheadness, and palpitations. Resp:  Denies cough. GI:  Denies abdominal pain and change in bowel habits. MS:  Complains of joint pain, low back pain, mid back pain, and stiffness; denies joint redness, joint swelling, muscle aches, and cramps. Derm:  Denies itching, lesion(s), poor wound healing, and rash. Neuro:  Denies numbness  and tingling. Psych:  frustrated by chronic pain. Endo:  Denies cold intolerance, excessive thirst, excessive urination, and heat intolerance. Heme:  Denies abnormal bruising and bleeding.  Physical Exam  General:  overweight but generally well appearing  Head:  normocephalic, atraumatic, and  no abnormalities observed.   Eyes:  vision grossly intact, pupils equal, pupils round, and pupils reactive to light.   Mouth:  pharynx pink and moist.   Neck:  supple with full rom and no masses or thyromegally, no JVD or carotid bruit  Lungs:  Clear throughout to auscultation. Heart:  Regular rate and rhythm; no murmurs, rubs or bruits. Abdomen:  Bowel sounds positive,abdomen soft and non-tender without masses, organomegaly or hernias noted.  Msk:  very mild kyphosis  poor rom spine some oa changes in periph joints  Extremities:  No clubbing, cyanosis, edema, or deformity noted with normal full range of motion of all joints.   Neurologic:  sensation intact to light touch, gait normal, and DTRs symmetrical and normal.   Skin:  Intact without suspicious lesions or rashes Cervical Nodes:  No lymphadenopathy noted Inguinal Nodes:  No significant adenopathy Psych:  nl affect    Impression & Recommendations:  Problem # 1:  DEGENERATIVE DISC DISEASE (ICD-722.6) Assessment Deteriorated  pt will f/u with Dr Channing Mutters to ask more questions about her dx and pain control options  will foward copy of dexa   Orders: Prescription Created Electronically [phone])  Problem # 2:  UNSPECIFIED VITAMIN D DEFICIENCY (ICD-268.9) Assessment: Unchanged level today and adv disc imp to bone health Orders: Venipuncture (25956) T-Hepatic Function [phone]) T-Lipid Profile [phone]) T-Renal Function Panel [phone]) T- * Misc. Laboratory test [id number]) Prescription Created Electronically [id number])  Problem # 3:  ANXIETY (ICD-300.00) Assessment: Unchanged refil alprazolam for as needed use pt aware of red flags for sedation and addictive potential Her updated medication list for this problem includes:    Zoloft 100 Mg Tabs (Sertraline hcl) .Marland Kitchen... 2 by mouth daily    Alprazolam 0.5 Mg Tabs (Alprazolam) .Marland Kitchen... 1 by mouth up to two times a day as needed anxiety  Problem # 4:  OSTEOPENIA  (ICD-733.90) Assessment: Deteriorated is worse at FN this dexa - though spine score may be falsely elevated by deg change unable to take bisphosphenates due to jaw pain (disc jaw tumor risk) will try evista disc method by which is works and poss side eff re check dexa 2 y Her updated medication list for this problem includes:    Viactiv 500-500-40 Mg-unt-mcg Chew (Calcium-vitamin d-vitamin k) .Marland Kitchen..Marland Kitchen Two  times a day    Evista 60 Mg Tabs (Raloxifene hcl) .Marland Kitchen... 1 by mouth once daily  Orders: Venipuncture (42706) T-Hepatic Function [phone]) T-Lipid Profile [phone]) T-Renal Function Panel [phone]) T- * Misc. Laboratory test [phone]) Prescription Created Electronically [phone])  Problem # 5:  HYPERLIPIDEMIA (ICD-272.4) overdue lab- do today Her updated medication list for this problem includes:    Lipitor 10 Mg Tabs (Atorvastatin calcium) .Marland Kitchen... 1 by mouth daily  Orders: Venipuncture (82993) T-Hepatic Function [phone]) T-Lipid Profile [phone]) T-Renal Function Panel [phone]) T- * Misc. Laboratory test [phone]) Prescription Created Electronically [phone])  Complete Medication List: 1)  Zoloft 100 Mg Tabs (Sertraline hcl) .... 2 by mouth daily 2)  Lipitor 10 Mg Tabs (Atorvastatin calcium) .Marland Kitchen.. 1 by mouth daily 3)  Nexium 40 Mg Cpdr (Esomeprazole magnesium) .... Take 1 tablet by mouth two times a day 4)  Tylenol Arthritis Pain 650 Mg Tbcr (Acetaminophen) .Marland Kitchen.. 1 by mouth as needed 5)  Plavix 75 Mg Tabs (Clopidogrel bisulfate) .... Take one by mouth daily 6)  Alprazolam 0.5 Mg Tabs (Alprazolam) .Marland Kitchen.. 1 by mouth up to two times a day as needed anxiety 7)  Macular Protect Complete  .Marland Kitchen.. 1 a day 8)  Tylenol Sinus Congestion/pain 5-325-200 Mg Tabs (Phenylephrine-apap-guaifenesin) .... As needed 9)  Viactiv 500-500-40 Mg-unt-mcg Chew (Calcium-vitamin d-vitamin k) .... Two  times a day 10)  Hydrocodone-acetaminophen 5-500 Mg Tabs (Hydrocodone-acetaminophen) .... Take  one every four hours as needed for pain 11)  Evista 60 Mg Tabs (Raloxifene hcl) .Marland Kitchen.. 1 by mouth once daily  Patient Instructions: 1)  checking vit D level today  2)  try the evista daily  3)  the current recommendation for calcium intake is 1200-1500 mg daily with 1000 IU of vitamin D  4)  will plan next dexa in 2 years  Prescriptions: ALPRAZOLAM 0.5 MG  TABS (ALPRAZOLAM) 1 by mouth up to two times a day as needed anxiety  #30 x 0   Entered and Authorized by:   Judith Part MD   Signed by:   Judith Part MD on 11/06/2009   Method used:   Print then Give to Patient   RxID:   [id number] EVISTA 60 MG TABS (RALOXIFENE HCL) 1 by mouth once daily  #30 x 11   Entered and Authorized by:   Judith Part MD   Signed by:   Judith Part MD on 11/06/2009   Method used:   [facility]        [facility]  [address] [address] [id number]       [address]       [place], [place]  [address]       Ph: [phone]       Fax: [phone]   RxID:   [id number]    Orders Added: 1)  Venipuncture [36415] 2)  T-Hepatic Function [[id number]] 3)  T-Lipid Profile [[id number]] 4)  T-Renal Function Panel [[id number]] 5)  T- * Misc. Laboratory test [id number] 6)  Prescription Created Electronically [[id number]] 7)  Est. Patient Level IV [88416]   Immunization History:  Influenza Immunization History:    Influenza:  historical (10/17/2009)   Immunization History:  Influenza Immunization History:    Influenza:  Historical (10/17/2009)  Current Allergies (reviewed today): ! SULFA ! * LATEX * FOSAMAX * CELEBREX * PERCOCET  Appended Document: FOLLOW UP AFTER BONE DENSITY/RI Copy of Bone density report faxed to Dr Channing Mutters Neurosurgeon as instructed.

## 2010-02-18 NOTE — Progress Notes (Signed)
Summary: Sertraline 100mg  refill  Phone Note Refill Request Call back at [phone] Message from:  [facility] on April 30, 2009 4:32 PM  Refills Requested: Medication #1:  ZOLOFT 100 MG TABS 2 by mouth daily [facility] refill on Sertraline 100mg  No date sent for last refill [phone] .Please advise.    Method Requested: Telephone to Pharmacy Initial call taken by: Lewanda Rife LPN,  April 30, 2009 4:32 PM  Follow-up for Phone Call        px written on EMR for call in  Follow-up by: Judith Part MD,  April 30, 2009 4:56 PM  Additional Follow-up for Phone Call Additional follow up Details #1::        [facility]ication [facility] to [facility] as instructed. Lewanda Rife LPN  April 30, 2009 5:04 PM     Prescriptions: ZOLOFT 100 MG TABS (SERTRALINE HCL) 2 by mouth daily  #60 x 11   Entered and Authorized by:   Judith Part MD   Signed by:   Lewanda Rife LPN on 14/78/2956   Method used:   [facility]phoned to ...       [facility] [id number]       [address]       [place], [place]  [address]       Ph: [phone]       Fax: [phone]   RxID:   [id number]

## 2010-02-18 NOTE — Progress Notes (Signed)
Summary: new scripts needed for evista, sertraline  Phone Note Refill Request Message from:  Fax from Pharmacy  Refills Requested: Medication #1:  EVISTA 60 MG TABS 1 by mouth once daily.  Medication #2:  ZOLOFT 100 MG TABS 2 by mouth daily F[facility]ed [facility] are on your shelf, they are asking for new scripts.   Follow-up for Phone Call        form done and in nurse in box  Follow-up by: Judith Part MD,  January 21, 2010 12:57 PM  Additional Follow-up for Phone Call Additional follow up Details #1::        completed forms faxed to [phone] as instructed.Lewanda Rife LPN  January 21, 2010 3:05 PM     Prescriptions: EVISTA 60 MG TABS (RALOXIFENE HCL) 1 by mouth once daily  #90 x 3   Entered and Authorized by:   Judith Part MD   Signed by:   Lamona Curl CMA (AAMA) on 01/21/2010   Method used:   Historical   RxID:   [id number] ZOLOFT 100 MG TABS (SERTRALINE HCL) 2 by mouth daily  #180 x 3   Entered and Authorized by:   Judith Part MD   Signed by:   Lamona Curl CMA (AAMA) on 01/21/2010   Method used:   Historical   RxID:   [id number]

## 2010-02-18 NOTE — Progress Notes (Signed)
Summary: want something to build up bone strength  Phone Note Call from Patient Call back at Home Phone [phone]   Caller: Patient Call For: [name] Part MD Summary of Call: Patient is asking if there is anything she can take that would build her bone strength up quicker. She says that she has been told that there is a shot that you can take once a year.  Please advise.  Initial call taken by: [name] [name],  December 31, 2009 9:42 AM  Follow-up for Phone Call        I'm staying away from that class of med -- either oral or injectable/ infustion due to jaw tumor risk  we can disc more at her next visit Follow-up by: [name] Part MD,  December 31, 2009 8:32 PM  Additional Follow-up for Phone Call Additional follow up Details #1::        Patient notified as instructed by telephone. [name] Rife LPN  January 01, 2010 10:38 AM

## 2010-02-18 NOTE — Progress Notes (Signed)
Summary: pt will be having back surgery  Phone Note Call from Patient   Caller: Patient Call For: Judith Part MD Summary of Call: pt wanted you to know that she is having back surgery on 05/29/09, with Dr. Channing Mutters.  He will be doing some fusions. Initial call taken by: Lowella Petties CMA,  April 22, 2009 10:06 AM  Follow-up for Phone Call        thanks for the update Follow-up by: Judith Part MD,  April 22, 2009 12:05 PM

## 2010-02-18 NOTE — Progress Notes (Signed)
Summary: regarding potassium  Phone Note Call from Patient Call back at Home Phone [phone]   Caller: Patient Call For: Judith Part MD Summary of Call: Pt is supposed to come back in on friday to recheck potassium level.  She thinks the reason her potassium was high is because she eats 10-12 white raisins soaked in gin on a peanut butter sandwich every day for arthritis.  She has not had any in the last 2 days. Initial call taken by: Lowella Petties CMA, AAMA,  November 10, 2009 11:18 AM  Follow-up for Phone Call        thanks for the update -- will see how it is on re check Follow-up by: Judith Part MD,  November 10, 2009 1:31 PM  Additional Follow-up for Phone Call Additional follow up Details #1::        Patient notified as instructed by telephone. Lewanda Rife LPN  November 10, 2009 1:51 PM        Social History: Never Smoked pt is on disability since CVA for cognitive problems  (trying some part time work in [employer] in 09-- and was not able to do it) occassional alcohol  (eats raisins soaked in gin every day) separated

## 2010-02-18 NOTE — Progress Notes (Signed)
Summary: wants hemoccult cards  Phone Note Call from Patient Call back at Home Phone [phone]   Caller: Patient Call For: Judith Part MD Summary of Call: Pt states she has been having dark stools and she is asking if she can get some hemoccult cards to check for blood with.  Please advise. Initial call taken by: Lowella Petties CMA,  April 27, 2009 2:07 PM  Follow-up for Phone Call        get her some heme cards to do and then f/u with me 1 week later -- I wil develop them at f/u  if worse or abd pain let me know right away  hold any meds with iron in them as they will turn cards positive  Follow-up by: Judith Part MD,  April 27, 2009 2:57 PM  Additional Follow-up for Phone Call Additional follow up Details #1::        Patient notified as instructed by telephone. Pt requested hemmocult cards mailed to her home address and pt will call back for appt.Lewanda Rife LPN  April 27, 2009 4:57 PM

## 2010-02-18 NOTE — Letter (Signed)
Summary: [facility]  [facility]   Imported By: Lanelle Bal 07/01/2009 10:40:33  _____________________________________________________________________  External Attachment:    Type:   Image     Comment:   External Document

## 2010-02-18 NOTE — Progress Notes (Signed)
  Phone Note From Other Clinic   Caller: Referral Coordinator Summary of Call: Called Dr Rolan Bucco office spoke to Dr Sharen Heck. Dr Channing Mutters has been out on vacation since patient had discogram. Dr Franky Macho called the patient and did give the results of her discogram directly to the patient. Dr Channing Mutters got back from vacation today and Ms [facility] chart is on his desk waiting for review to give her an appt. They will call the patient directly aftter Dr Channing Mutters reviews chart and telld them where to put her on schedule. I discussed this with Desarea when she checked out today. Copy of discogram and CT are in your in box.  Initial call taken by: Carlton Adam,  April 13, 2009 5:05 PM  Follow-up for Phone Call        thanks for the update- that is most helpful Follow-up by: Judith Part MD,  April 13, 2009 5:20 PM

## 2010-02-18 NOTE — Progress Notes (Signed)
Summary: refill request for alprazolam  Phone Note Refill Request Message from:  Fax from Pharmacy  Refills Requested: Medication #1:  ALPRAZOLAM 0.5 MG  TABS as needed   Last Refilled: 03/20/2008 Faxed request from [address], [phone].  Initial call taken by: Lowella Petties CMA,  June 19, 2009 3:16 PM  Follow-up for Phone Call        px written on EMR for call in  Follow-up by: Judith Part MD,  June 19, 2009 3:17 PM  Additional Follow-up for Phone Call Additional follow up Details #1::        Medication phoned to pharmacy.  Additional Follow-up by: Delilah Shan CMA (AAMA),  June 19, 2009 4:17 PM    New/Updated Medications: ALPRAZOLAM 0.5 MG  TABS (ALPRAZOLAM) 1 by mouth up to two times a day as needed anxiety Prescriptions: ALPRAZOLAM 0.5 MG  TABS (ALPRAZOLAM) 1 by mouth up to two times a day as needed anxiety  #30 x 0   Entered and Authorized by:   Judith Part MD   Signed by:   Judith Part MD on 06/19/2009   Method used:   Telephoned to ...       [id number]       [address]       [place], [place]  [address]       Ph: [phone]       Fax: [phone]   RxID:   [id number]

## 2010-02-18 NOTE — Miscellaneous (Signed)
Summary: BONE DENSITY  Clinical Lists Changes  Orders: Added new Test order of T-Bone Densitometry (77080) - Signed Added new Test order of T-Lumbar Vertebral Assessment (77082) - Signed 

## 2010-02-18 NOTE — Progress Notes (Signed)
Summary: Rx Sertraline  Phone Note Refill Request Call back at [phone] Message from:  [facility] on April 02, 2009 9:13 AM  Refills Requested: Medication #1:  ZOLOFT 100 MG TABS 2 by mouth daily   Last Refilled: 03/05/2009 Received faxed refill request.  Dr. Milinda Antis out of the office until Tuesday.  Please advise.   Method Requested: Telephone to Pharmacy Initial call taken by: Linde Gillis CMA Duncan Dull),  April 02, 2009 9:14 AM    Prescriptions: ZOLOFT 100 MG TABS (SERTRALINE HCL) 2 by mouth daily  #60 x 0   Entered and Authorized by:   Shaune Leeks MD   Signed by:   Shaune Leeks MD on 04/02/2009   Method used:   Electronically to        [id number]       [address]       [place], [place]  [address]       Ph: [phone]       Fax: [phone]   RxID:   [id number]

## 2010-02-18 NOTE — Assessment & Plan Note (Signed)
Summary: back pain/alc   Vital Signs:  Patient profile:   76 year old [name] [name] Height:      63 inches Weight:      209.50 pounds BMI:     37.25 Temp:     97.9 degrees F oral Pulse rate:   76 / minute Pulse rhythm:   regular BP sitting:   128 / 80  (left arm) Cuff size:   regular  Vitals Entered By: Lewanda Rife LPN (April 13, 2009 3:34 PM) CC: lower back pain, degenerative disc #4 and5   History of Present Illness: here for back pain has known deg disc dz with spondylosis on L5-S1 and L4 radiculopathy say neurosx- Dr Channing Mutters last mo recomm discography ? if surg an option  she cannot get any info yet Ct scan and discogram  has called his office multiple times and "they will not call her back"  got her hydrocodone from rheum- and she no longer sees him so needs a refil   L leg and hip are severely painful  worse to lie on L side  better to hunch foward -this tends to help    Allergies: 1)  ! Sulfa 2)  ! * Latex 3)  * Fosamax 4)  * Celebrex 5)  * Percocet  Past History:  Past Medical History: Last updated: 12/19/2008 Current Problems:  Hx of CHEST WALL PAIN (ICD-786.52) CEREBROVASCULAR ACCIDENT, HX OF (ICD-V12.50) PALPITATIONS (ICD-785.1) DYSPNEA (ICD-786.05) HYPERLIPIDEMIA (ICD-272.4) GERD (ICD-530.81) DEPRESSION (ICD-311) ANXIETY (ICD-300.00) Hx of CVA (ICD-434.91) ARTHRITIS, GENERALIZED (ICD-716.99) ARTHRITIS, GENERALIZED (ICD-716.99) GASTRITIS, HX OF (ICD-V12.79) HIATAL HERNIA (ICD-553.3) EXTERNAL HEMORRHOIDS (ICD-455.3) DIVERTICULOSIS OF COLON (ICD-562.10) DIARRHEA (ICD-787.91) SPECIAL SCREENING MALIG NEOPLASMS OTHER SITES (ICD-V76.49) UNSPECIFIED VITAMIN D DEFICIENCY (ICD-268.9) FATIGUE (ICD-780.79) OSTEOPENIA (ICD-733.90) Hx of HELICOBACTER PYLORI INFECTION (ICD-041.86) Hx of MACULAR DEGENERATION (ICD-362.50) OSTEOARTHRITIS (ICD-715.90) LS pain with deg disc (12/10)  ortho (PA) Leilani Able  Past Surgical History: Last updated:  11-Nov-2008 Appendectomy Cataract extraction- right knee surgery Hysterectomy- partial for bleeding Tubal ligation Cardiolite stress only (1997) Carotid dopplers- o.k. (01/2000) Dexa- mild osteopenia femoral neck (02/2000) Admit- r/o MI,  negative cardiolite.  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 CV:  Denies chest pain or discomfort and palpitations. Resp:  Denies cough and shortness of breath. GI:  Denies abdominal pain, nausea, and vomiting. GU:  Denies dysuria, nocturia, and urinary frequency. MS:  Complains of loss of strength, low back pain, and stiffness; denies cramps. Derm:  Denies rash. Neuro:  Complains of numbness, tingling, and weakness. Heme:  Denies abnormal bruising and bleeding.  Physical Exam  General:  overweight but generally well appearing  Head:  normocephalic, atraumatic, and no abnormalities observed.   Neck:  nl rom  Heart:  Regular rate and rhythm; no murmurs, rubs or bruits. Msk:  labored gait with cane  tender LS Extremities:  No clubbing, cyanosis, edema or deformities noted. Neurologic:  gait is labored with a cane Skin:  Intact without suspicious lesions or  rashes Psych:  normal affect, talkative and pleasant    Impression & Recommendations:  Problem # 1:  DEGENERATIVE DISC DISEASE (ICD-722.6) Assessment Deteriorated with L4 radiculopathy in moderately severe pain  waiting on discogram result  ref for f/u Dr Channing Mutters to disc tx options  refilled her hydrocodone #120 and warned about tylenol intake/ parameters  since she is no longer going to rheumatology -- I will px this now  will also request discogram result  Orders: Neurosurgeon Referral (Neurosurgeon)  Complete Medication List: 1)  Zoloft 100 Mg Tabs (Sertraline hcl) .... 2 by mouth daily 2)  Lipitor 10 Mg Tabs (Atorvastatin calcium) .Marland Kitchen.. 1 by mouth daily 3)  Nexium 40 Mg Cpdr (Esomeprazole magnesium) .... Take 1 tablet by mouth two times a day 4)  Tylenol Arthritis Pain 650 Mg Tbcr (Acetaminophen) .Marland Kitchen.. 1 by mouth as needed 5)  Plavix 75 Mg Tabs (Clopidogrel bisulfate) .... Take one by mouth daily 6)  Alprazolam 0.5 Mg Tabs (Alprazolam) .... As needed 7)  Macular Protect Complete  .Marland Kitchen.. 1 a day 8)  Tylenol Sinus Congestion/pain 5-325-200 Mg Tabs (Phenylephrine-apap-guaifenesin) .... As needed 9)  Viactiv 500-500-40 Mg-unt-mcg Chew (Calcium-vitamin d-vitamin k) .... Two  times a day 10)  Hydrocodone-acetaminophen 5-500 Mg Tabs (Hydrocodone-acetaminophen) .... Take one every four hours as needed for pain  Patient Instructions: 1)  use caution with pain medicine  2)  use stool softener if needed for constipation  3)  tylenol no less than 4 hours apart  4)  we will do neurosurg ref at check out  Prescriptions: HYDROCODONE-ACETAMINOPHEN 5-500 MG TABS (HYDROCODONE-ACETAMINOPHEN) take one every four hours as needed for pain  #120 x 0   Entered and Authorized by:   Judith Part MD   Signed by:   Judith Part MD on 04/13/2009   Method used:   Print then Give to Patient   RxID:   [id number]   Current Allergies (reviewed today): ! SULFA ! * LATEX * FOSAMAX * CELEBREX *  PERCOCET   Immunization History:  Influenza Immunization History:    Influenza:  historical (11/15/2008)

## 2010-02-18 NOTE — Letter (Signed)
Summary: Results Follow up Letter  [place] [facility]  [address] [place], [place] [address]   Phone: [phone]  Fax: [phone]    04/16/2009 MRN: [id number]     Jarika Coello [address] [place], [place]  [address]    Dear Ms. Goldie,  The following are the results of your recent test(s):  Test         Result    Pap Smear:        Normal _____  Not Normal _____ Comments: ______________________________________________________ Cholesterol: LDL(Bad cholesterol):         Your goal is less than:         HDL (Good cholesterol):       Your goal is more than: Comments:  ______________________________________________________ Mammogram:        Normal ___x__  Not Normal _____ Comments: Repeat in 1 year  ___________________________________________________________________ Hemoccult:        Normal _____  Not normal _______ Comments:    _____________________________________________________________________ Other Tests:    We routinely do not discuss normal results over the telephone.  If you desire a copy of the results, or you have any questions about this information we can discuss them at your next office visit.   Sincerely,  Roxy Manns MD

## 2010-02-18 NOTE — Letter (Signed)
Summary: [facility] Office Note  [facility] Office Note   Imported By: Roderic Ovens 05/13/2009 14:01:16  _____________________________________________________________________  External Attachment:    Type:   Image     Comment:   External Document

## 2010-02-18 NOTE — Progress Notes (Signed)
Summary: refill request for nexium, lipitor  Phone Note Refill Request Message from:  Fax from Pharmacy  Refills Requested: Medication #1:  LIPITOR 10 MG TABS 1 by mouth daily  Medication #2:  NEXIUM 40 MG CPDR Take 1 tablet by mouth two times a day F[facility]ed [facility] are on your shelf.  These are for new scripts.  Initial call taken by: Lowella Petties CMA, AAMA,  February 01, 2010 9:01 AM  Follow-up for Phone Call        form done and in nurse in box  Follow-up by: Judith Part MD,  February 01, 2010 1:18 PM  Additional Follow-up for Phone Call Additional follow up Details #1::        [facility].  Additional Follow-up by: Melody Comas,  February 01, 2010 2:12 PM    New/Updated Medications: LIPITOR 10 MG TABS (ATORVASTATIN CALCIUM) 1 by mouth daily NEXIUM 40 MG CPDR (ESOMEPRAZOLE MAGNESIUM) Take 1 tablet by mouth two times a day Prescriptions: NEXIUM 40 MG CPDR (ESOMEPRAZOLE MAGNESIUM) Take 1 tablet by mouth two times a day  #180 x 3   Entered and Authorized by:   Judith Part MD   Signed by:   Melody Comas on 02/01/2010   Method used:   Historical   RxID:   [id number] LIPITOR 10 MG TABS (ATORVASTATIN CALCIUM) 1 by mouth daily  #90 x 3   Entered and Authorized by:   Judith Part MD   Signed by:   Melody Comas on 02/01/2010   Method used:   Historical   RxID:   [id number]

## 2010-02-18 NOTE — Letter (Signed)
Summary: [facility]  [facility]   Imported By: Lanelle Bal 07/29/2009 14:18:48  _____________________________________________________________________  External Attachment:    Type:   Image     Comment:   External Document

## 2010-02-18 NOTE — Letter (Signed)
Summary: [facility]  [facility]   Imported By: Lanelle Bal 11/23/2009 08:25:39  _____________________________________________________________________  External Attachment:    Type:   Image     Comment:   External Document

## 2010-02-18 NOTE — Letter (Signed)
Summary: [facility]  [facility]   Imported By: Lanelle Bal 05/01/2009 13:14:06  _____________________________________________________________________  External Attachment:    Type:   Image     Comment:   External Document

## 2010-03-12 ENCOUNTER — Ambulatory Visit (INDEPENDENT_AMBULATORY_CARE_PROVIDER_SITE_OTHER): Payer: Medicare Other | Admitting: Family Medicine

## 2010-03-12 ENCOUNTER — Encounter: Payer: Self-pay | Admitting: Family Medicine

## 2010-03-12 DIAGNOSIS — N318 Other neuromuscular dysfunction of bladder: Secondary | ICD-10-CM | POA: Insufficient documentation

## 2010-03-12 DIAGNOSIS — L981 Factitial dermatitis: Secondary | ICD-10-CM | POA: Insufficient documentation

## 2010-03-12 LAB — CONVERTED CEMR LAB
Bilirubin Urine: NEGATIVE
Casts: 0 /lpf
Glucose, Urine, Semiquant: NEGATIVE
Ketones, urine, test strip: NEGATIVE
RBC / HPF: 0
Specific Gravity, Urine: 1.015
pH: 6

## 2010-03-23 ENCOUNTER — Encounter: Payer: Self-pay | Admitting: Family Medicine

## 2010-03-25 ENCOUNTER — Other Ambulatory Visit: Payer: Self-pay | Admitting: Family Medicine

## 2010-03-25 DIAGNOSIS — Z1231 Encounter for screening mammogram for malignant neoplasm of breast: Secondary | ICD-10-CM

## 2010-03-25 NOTE — Assessment & Plan Note (Signed)
Summary: ? UTI   Vital Signs:  Patient profile:   76 year old female Height:      63 inches Weight:      212.25 pounds BMI:     37.73 Temp:     97.6 degrees F oral Pulse rate:   76 / minute Pulse rhythm:   regular BP sitting:   114 / 68  (left arm) Cuff size:   regular  Vitals Entered By: Lewanda Rife LPN (March 12, 2010 11:35 AM) CC: pressure and frequency of urine and breaking out on upper left arm   History of Present Illness: is having severe frequency of urination  did inc her water -no imp  no burning  no bladder pain - but a lot of pressure never had bladder tack up   stays thirsty - from her allergy pills   no new back pain  no fever or n/v   had hyst in past   also some breaking out on L upper arm   back pain- screws slipped and may need surgery anxiety over this  needs refl of xanax- does not use often scratching L arm and causing excoriation out of anxiety needs abx oint   Allergies: 1)  ! Sulfa 2)  ! * Latex 3)  * Fosamax 4)  * Celebrex 5)  * Percocet  Past History:  Past Medical History: Last updated: 12/19/2008 Current Problems:  Hx of CHEST WALL PAIN (ICD-786.52) CEREBROVASCULAR ACCIDENT, HX OF (ICD-V12.50) PALPITATIONS (ICD-785.1) DYSPNEA (ICD-786.05) HYPERLIPIDEMIA (ICD-272.4) GERD (ICD-530.81) DEPRESSION (ICD-311) ANXIETY (ICD-300.00) Hx of CVA (ICD-434.91) ARTHRITIS, GENERALIZED (ICD-716.99) ARTHRITIS, GENERALIZED (ICD-716.99) GASTRITIS, HX OF (ICD-V12.79) HIATAL HERNIA (ICD-553.3) EXTERNAL HEMORRHOIDS (ICD-455.3) DIVERTICULOSIS OF COLON (ICD-562.10) DIARRHEA (ICD-787.91) SPECIAL SCREENING MALIG NEOPLASMS OTHER SITES (ICD-V76.49) UNSPECIFIED VITAMIN D DEFICIENCY (ICD-268.9) FATIGUE (ICD-780.79) OSTEOPENIA (ICD-733.90) Hx of HELICOBACTER PYLORI INFECTION (ICD-041.86) Hx of MACULAR DEGENERATION (ICD-362.50) OSTEOARTHRITIS (ICD-715.90) LS pain with deg disc (12/10)  ortho (PA) Leilani Able  Past Surgical History: Last  updated: 10/19/2008 Appendectomy Cataract extraction- right knee surgery Hysterectomy- partial for bleeding Tubal ligation Cardiolite stress only (1997) Carotid dopplers- o.k. (01/2000) Dexa- mild osteopenia femoral neck (02/2000) Admit- r/o MI,  negative cardiolite.  Anxiety (08/2001) Colonoscopy- divertics, hemorrhoids (10/2001)- recommended 5 year follow up colonosc  EGD- HH, gastritis (10/2001) Carotid dopplers- no change (02/2003) hosp admission for TIA 4/09  9/09 stress cardiolite neg  Family History: Last updated: Oct 19, 2008 daughter died pancreatic cancer brother CAD, carotid disease M, B hyperlipidemia Family History of Diabetes: Sister, brother Family History of Heart Disease: Father, Brother Family History of Ovarian Cancer: Daughter  Social History: Last updated: 11/10/2009 Never Smoked pt is on disability since CVA for cognitive problems  (trying some part time work in [employer] office in 09-- and was not able to do it) occassional alcohol  (eats raisins soaked in gin every day) separated   Risk Factors: Smoking Status: never (06/06/2006)  Review of Systems General:  Complains of fatigue; denies chills, fever, loss of appetite, and malaise. Eyes:  Denies blurring and eye irritation. CV:  Denies chest pain or discomfort, palpitations, and shortness of breath with exertion. Resp:  Denies cough, shortness of breath, and wheezing. GI:  Denies abdominal pain, bloody stools, and change in bowel habits. GU:  Complains of dysuria, incontinence, nocturia, and urinary frequency; denies hematuria. MS:  Complains of low back pain and mid back pain; bone graft has slipped - needs more surgery. Derm:  Denies itching, lesion(s), poor wound healing, and rash. Neuro:  Denies numbness and  tingling. Endo:  Denies cold intolerance, excessive thirst, excessive urination, and heat intolerance. Heme:  Denies abnormal bruising and bleeding.  Physical Exam  General:  overweight but  generally well appearing  Head:  normocephalic, atraumatic, and no abnormalities observed.   Eyes:  vision grossly intact, pupils equal, pupils round, and pupils reactive to light.  no conjunctival pallor, injection or icterus  Mouth:  pharynx pink and moist.   Neck:  supple with full rom and no masses or thyromegally, no JVD or carotid bruit  Chest Wall:  No deformities, masses, or tenderness noted. Lungs:  Normal respiratory effort, chest expands symmetrically. Lungs are clear to auscultation, no crackles or wheezes. Heart:  Regular rate and rhythm; no murmurs, rubs or bruits. Abdomen:  feeling of pressure and urgency when palp suprapubic area but not pain Msk:  very mild kyphosis  poor rom spine some oa changes in periph joints  Extremities:  No clubbing, cyanosis, edema, or deformity noted with normal full range of motion of all joints.   Neurologic:  sensation intact to light touch, gait normal, and DTRs symmetrical and normal.   Skin:  Intact without suspicious lesions or rashes Cervical Nodes:  No lymphadenopathy noted Inguinal Nodes:  No significant adenopathy Psych:  seems mildly anxious today- but talkative and pleasant    Impression & Recommendations:  Problem # 1:  OVERACTIVE BLADDER (ICD-596.51) Assessment New  with some stress and urge incontinence in elderly female with hx of hyst neg ua trial of detrol la 4 mg and update f/u 1-2 mo for re check and pelvic exam in addiition did adv pt to update her back specialist about loss of continence  Orders: UA Dipstick W/ Micro (manual) (16109) Prescription Created Electronically [phone])  Problem # 2:  NEUROTIC EXCORIATIONS (ICD-698.4) Assessment: New  over mostly L arm this is recurrent when under significant stress - at this time back pain disc cutting fingernails very short bactroban ointment two times a day to prev infection offered counseling if not improved   Orders: Prescription Created Electronically  [phone])  Complete Medication List: 1)  Zoloft 100 Mg Tabs (Sertraline hcl) .... 2 by mouth daily 2)  Lipitor 10 Mg Tabs (Atorvastatin calcium) .Marland Kitchen.. 1 by mouth daily 3)  Nexium 40 Mg Cpdr (Esomeprazole magnesium) .... Take 1 tablet by mouth two times a day 4)  Plavix 75 Mg Tabs (Clopidogrel bisulfate) .... Take one by mouth daily 5)  Alprazolam 0.5 Mg Tabs (Alprazolam) .Marland Kitchen.. 1 by mouth up to two times a day as needed anxiety 6)  Tylenol Sinus Congestion/pain 5-325-200 Mg Tabs (Phenylephrine-apap-guaifenesin) .... As needed 7)  Viactiv 500-500-40 Mg-unt-mcg Chew (Calcium-vitamin d-vitamin k) .... Two  times a day 8)  Evista 60 Mg Tabs (Raloxifene hcl) .Marland Kitchen.. 1 by mouth once daily 9)  Hydrocodone-acetaminophen 10-325 Mg Tabs (Hydrocodone-acetaminophen) .Marland Kitchen.. 1 - 2 tablets by mouth every 8 hours as needed for pain 10)  Bactroban Nasal 2 % Oint (Mupirocin calcium) .... Apply to affected area two times a day 11)  Detrol La 4 Mg Xr24h-cap (Tolterodine tartrate) .Marland Kitchen.. 1 by mouth once daily for bladder control  Patient Instructions: 1)  try the detrol LA for overactive bladder/ incontinence 2)  remember the "freeze and squeeze " technique 3)  cut fingernails short and try not to scratch  4)  use bactroban ointment until healed  5)  use xanax sparingly as needed  6)  follow up in 1-2 months for re check of bladder and pelvic exam  Prescriptions: ALPRAZOLAM  0.5 MG  TABS (ALPRAZOLAM) 1 by mouth up to two times a day as needed anxiety  #30 x 0   Entered and Authorized by:   Judith Part MD   Signed by:   Judith Part MD on 03/12/2010   Method used:   Print then Give to Patient   RxID:   [id number] DETROL LA 4 MG XR24H-CAP (TOLTERODINE TARTRATE) 1 by mouth once daily for bladder control  #30 x 11   Entered and Authorized by:   Judith Part MD   Signed by:   Judith Part MD on 03/12/2010   Method used:   Electronically to        [id number] ([facility])       [address]       [place], [place]  [address]       Ph: [phone]       Fax: [phone]   RxID:   [id number] BACTROBAN NASAL 2 % OINT (MUPIROCIN CALCIUM) apply to affected area two times a day  #1 medium x 1   Entered and Authorized by:   Judith Part MD   Signed by:   Judith Part MD on 03/12/2010   Method used:   Electronically to        [facility] [address] [id number] ([facility]       [address]       [place], [place]  [address]       Ph: [phone]       Fax: [phone]   RxID:   [id number]    Orders Added: 1)  UA Dipstick W/ Micro (manual) [81000] 2)  Est. Patient Level III [70623] 3)  Prescription Created Electronically [phone]    Current Allergies (reviewed today): ! SULFA ! * LATEX * FOSAMAX * CELEBREX * PERCOCET  Laboratory Results   Urine Tests  Date/Time Received: March 12, 2010 11:38 AM  Date/Time Reported: March 12, 2010 11:38 AM   Routine Urinalysis   Color: yellow Appearance: slightly hazy Glucose: negative   (Normal Range: Negative) Bilirubin: negative   (Normal Range: Negative) Ketone: negative   (Normal Range: Negative) Spec. Gravity: 1.015   (Normal Range: 1.003-1.035) Blood: trace-lysed   (Normal Range: Negative) pH: 6.0   (Normal Range: 5.0-8.0) Protein: trace   (Normal Range: Negative) Urobilinogen: 0.2   (Normal Range: 0-1) Nitrite: negative   (Normal Range: Negative) Leukocyte Esterace: negative   (Normal Range: Negative)  Urine Microscopic WBC/HPF: 0-1 RBC/HPF: 0 Bacteria/HPF: few Mucous/HPF: few Epithelial/HPF: 0-1 Crystals/HPF: 0 Casts/LPF: 0 Yeast/HPF: 0 Other: 0

## 2010-04-06 LAB — DIFFERENTIAL
Basophils Relative: 1 % (ref 0–1)
Eosinophils Absolute: 0.4 10*3/uL (ref 0.0–0.7)
Eosinophils Relative: 6 % — ABNORMAL HIGH (ref 0–5)
Lymphs Abs: 2.4 10*3/uL (ref 0.7–4.0)
Neutrophils Relative %: 48 % (ref 43–77)

## 2010-04-06 LAB — URINALYSIS, ROUTINE W REFLEX MICROSCOPIC
Bilirubin Urine: NEGATIVE
Glucose, UA: NEGATIVE mg/dL
Nitrite: NEGATIVE
Specific Gravity, Urine: 1.022 (ref 1.005–1.030)
pH: 5.5 (ref 5.0–8.0)

## 2010-04-06 LAB — COMPREHENSIVE METABOLIC PANEL
ALT: 18 U/L (ref 0–35)
Albumin: 4.4 g/dL (ref 3.5–5.2)
Alkaline Phosphatase: 76 U/L (ref 39–117)
Chloride: 106 mEq/L (ref 96–112)
Glucose, Bld: 72 mg/dL (ref 70–99)
Potassium: 4 mEq/L (ref 3.5–5.1)
Sodium: 139 mEq/L (ref 135–145)
Total Bilirubin: 0.7 mg/dL (ref 0.3–1.2)
Total Protein: 7.3 g/dL (ref 6.0–8.3)

## 2010-04-06 LAB — URINE MICROSCOPIC-ADD ON

## 2010-04-06 LAB — CBC
Hemoglobin: 14.1 g/dL (ref 12.0–15.0)
RDW: 13.1 % (ref 11.5–15.5)
WBC: 6.8 10*3/uL (ref 4.0–10.5)

## 2010-04-06 LAB — SURGICAL PCR SCREEN: MRSA, PCR: NEGATIVE

## 2010-04-06 LAB — PROTIME-INR: Prothrombin Time: 12 seconds (ref 11.6–15.2)

## 2010-04-07 ENCOUNTER — Other Ambulatory Visit: Payer: Self-pay | Admitting: Family Medicine

## 2010-04-07 MED ORDER — TOLTERODINE TARTRATE ER 4 MG PO CP24: 4.0000 mg | ORAL_CAPSULE | Freq: Every day | ORAL | Status: DC

## 2010-04-26 ENCOUNTER — Ambulatory Visit

## 2010-04-29 ENCOUNTER — Ambulatory Visit
Admission: RE | Admit: 2010-04-29 | Discharge: 2010-04-29 | Disposition: A | Discharge status: Home / Self Care | Payer: Medicare Other | Source: Ambulatory Visit | Attending: Family Medicine | Admitting: Family Medicine

## 2010-04-29 DIAGNOSIS — Z1231 Encounter for screening mammogram for malignant neoplasm of breast: Secondary | ICD-10-CM

## 2010-04-29 LAB — HM MAMMOGRAPHY: HM Mammogram: NORMAL

## 2010-05-04 ENCOUNTER — Other Ambulatory Visit: Payer: Self-pay | Admitting: *Deleted

## 2010-05-04 MED ORDER — CLOPIDOGREL BISULFATE 75 MG PO TABS: 75.0000 mg | ORAL_TABLET | Freq: Every day | ORAL | Status: DC

## 2010-05-12 ENCOUNTER — Ambulatory Visit: Admitting: Family Medicine

## 2010-05-13 ENCOUNTER — Telehealth: Payer: Self-pay

## 2010-05-13 NOTE — Telephone Encounter (Signed)
Letter mailed to patient with mammogram report as instructed. Health maintenance updated.

## 2010-05-17 ENCOUNTER — Other Ambulatory Visit: Payer: Self-pay | Admitting: Neurosurgery

## 2010-05-17 DIAGNOSIS — M25552 Pain in left hip: Secondary | ICD-10-CM

## 2010-05-21 ENCOUNTER — Ambulatory Visit
Admission: RE | Admit: 2010-05-21 | Discharge: 2010-05-21 | Disposition: A | Discharge status: Home / Self Care | Payer: Medicare Other | Source: Ambulatory Visit | Attending: Neurosurgery | Admitting: Neurosurgery

## 2010-05-21 DIAGNOSIS — M25552 Pain in left hip: Secondary | ICD-10-CM

## 2010-05-26 ENCOUNTER — Encounter: Payer: Self-pay | Admitting: Family Medicine

## 2010-05-26 ENCOUNTER — Ambulatory Visit (INDEPENDENT_AMBULATORY_CARE_PROVIDER_SITE_OTHER): Payer: Medicare Other | Admitting: Family Medicine

## 2010-05-26 DIAGNOSIS — Z01419 Encounter for gynecological examination (general) (routine) without abnormal findings: Secondary | ICD-10-CM | POA: Insufficient documentation

## 2010-05-26 DIAGNOSIS — R32 Unspecified urinary incontinence: Secondary | ICD-10-CM

## 2010-05-26 DIAGNOSIS — N318 Other neuromuscular dysfunction of bladder: Secondary | ICD-10-CM

## 2010-05-26 LAB — POCT URINALYSIS DIPSTICK
Bilirubin, UA: NEGATIVE
Glucose, UA: NEGATIVE
Nitrite, UA: NEGATIVE

## 2010-05-26 NOTE — Patient Instructions (Signed)
Try taking the detrol la at night (bedtime ) instead of am to see if it helps more  Urinalysis looks clear again  Exam seems normal with some bladder prolapse  If not improved - please call and I will refer you to a urologist

## 2010-05-26 NOTE — Assessment & Plan Note (Addendum)
ua today neg - trace of leukocytes on dip but none seen on micro (few epi/ no bacteria/ no rbc/ no wbc)- on micro exam  Some mild / mod cystocele on exam and moderate pelvic floor weakness , with healthy looking urethra and mucosa Urged to try the detrol la at night instead of daytime and report back  If not imp- will ref to urol for further eval and tx

## 2010-05-26 NOTE — Progress Notes (Signed)
Subjective:    Patient ID: [name] [name], female    DOB: Dec 04, 1934, 76 y.o.   MRN: [id number]  HPI Here for f/u of overactive bladder Last visit in feb -given detrol LA for frequency and bladder spasm and pressure/ incontinence with neg ua This helps during the day -- less frequency and urge to go Much worse when she lies down at night -- has to urinate all night long  Small amounts  No longer having incontinence - is using "freezing and squeezing"   Anxiety is no better at all -- takes 1/2 of xanax at night  Grandaughter came to live with her -- got kicked out of stepmother's house  Will live with her this summer  Is excited about that    Just got a shot in her hip Friday  Back is still a problem- has f/u in June  Her physician is aware of the bladder issues   Had hyst in the past -- partial  It was for excessive bleeding  Never had an abn pap smear    Wt is up 8 lb  More back problems   Urine dips neg with trace of leukocytes today  Past Medical History  Diagnosis Date  . Painful respiration   . Personal history of unspecified circulatory disease   . Palpitations   . Shortness of breath   . Other and unspecified hyperlipidemia   . Esophageal reflux   . Depressive disorder, not elsewhere classified   . Anxiety state, unspecified   . Unspecified cerebral artery occlusion with cerebral infarction   . Unspecified arthropathy, multiple sites   . Personal history of other diseases of digestive system   . Diaphragmatic hernia without mention of obstruction or gangrene   . External hemorrhoids without mention of complication   . Diverticulosis of colon (without mention of hemorrhage)   . Diarrhea   . Special screening for malignant neoplasms of other sites   . Unspecified vitamin D deficiency   . Other malaise and fatigue   . Disorder of bone and cartilage, unspecified   . Helicobacter pylori (H. pylori)   . Macular degeneration (senile) of retina, unspecified     . Osteoarthrosis, unspecified whether generalized or localized, unspecified site     History   Social History  . Marital Status: Legally Separated    Spouse Name: N/A    Number of Children: N/A  . Years of Education: N/A   Occupational History  . disabled    Social History Main Topics  . Smoking status: Never Smoker   . Smokeless tobacco: Not on file  . Alcohol Use: Yes     eats raisins soaked in gin everyday  . Drug Use: Not on file  . Sexually Active: Not on file   Other Topics Concern  . Not on file   Social History Narrative  . No narrative on file    Family History  Problem Relation Age of Onset  . Heart disease Father   . Diabetes Sister   . Coronary artery disease Brother   . Diabetes Brother   . Heart disease Brother   . Cancer Daughter     pancreatic cancer     Past Surgical History  Procedure Date  . Appendectomy   . Abdominal hysterectomy   . Tubal ligation   . Knee surgery   . Cataract extraction     right       Review of Systems Review of Systems  Constitutional: Negative for  fever, appetite change, and unexpected weight change. pos for chronic fatigue  Eyes: Negative for pain and visual disturbance.  Respiratory: Negative for cough and shortness of breath.   Cardiovascular: Negative for cp or edema  Gastrointestinal: Negative for nausea, diarrhea and constipation.  Genitourinary: pos for urgency and frequency, neg for dysuria or hematuria  Skin: Negative for pallor.  Neurological: Negative for weakness, light-headedness, numbness and headaches.  Hematological: Negative for adenopathy. Does not bruise/bleed easily.  Psychiatric/Behavioral: Negative for dysphoric mood. Pos for mild anx        Objective:   Physical Exam  Constitutional: She appears well-developed and well-nourished. No distress.       overwt and well appearing   HENT:  Head: Normocephalic and atraumatic.  Eyes: Conjunctivae and EOM are normal. Pupils are equal,  round, and reactive to light.  Neck: Normal range of motion. Neck supple. No thyromegaly present.  Cardiovascular: Normal rate and regular rhythm.   Pulmonary/Chest: Effort normal and breath sounds normal.  Abdominal: Soft. Bowel sounds are normal. She exhibits no distension and no mass. There is no tenderness.       No suprapubic tenderness    Genitourinary: Vagina normal. No breast swelling, tenderness, discharge or bleeding. Right adnexum displays no mass, no tenderness and no fullness. Left adnexum displays no mass, no tenderness and no fullness. No bleeding around the vagina. No vaginal discharge found.       Mild to mod cystocele noted with weak pelvic floor  Nl mucousa Nl app urethra No pain or M on exam   Musculoskeletal: She exhibits no edema.  Lymphadenopathy:    She has no cervical adenopathy.  Neurological: She is alert. Coordination normal.  Skin: Skin is warm and dry. No rash noted. No erythema. No pallor.  Psychiatric: She has a normal mood and affect.          Assessment & Plan:

## 2010-05-26 NOTE — Assessment & Plan Note (Signed)
Breast and bimanual pelvic exam done today  Mam up to date  No pap in light of prev hysterectomy

## 2010-05-27 DIAGNOSIS — N3946 Mixed incontinence: Secondary | ICD-10-CM | POA: Insufficient documentation

## 2010-05-27 NOTE — Assessment & Plan Note (Signed)
This is improved with detrol  Her neurosurgeon is aware

## 2010-06-01 NOTE — Assessment & Plan Note (Signed)
[facility]                            CARDIOLOGY OFFICE NOTE   NAME:[name], [name] ZABAWA                    MRN:          [id number]  DATE:09/21/2007                            DOB:          10-Jan-1935    REASON FOR EVALUATION:  Shortness of breath and chest pressure.   HISTORY OF PRESENT ILLNESS:  [name] [name] is a 76 year old woman with a  history of atypical chest pain and palpitations who presents today for  yearly followup and evaluation of exertional dyspnea, orthopnea, and  chest pressure.  When I saw her 1 year ago, she complained of  palpitations mainly at rest.  She really had no other symptoms.  Over  the last several months, she has developed chest pressure.  Her symptoms  come on abruptly and are not related to exertion.  However, she does  complain of orthopnea and exertional dyspnea.  She denies edema,  lightheadedness, or syncope.  She denies exertional chest pain.  Her  previous evaluation includes a nuclear stress test in 2007 that was low  risk.  Her LVEF was 70%.  There was a small partially reversible apical  defect that was probably due to shifting attenuation artifact.  She had  a 2-D echocardiogram done this spring when she presented with TIA  symptoms.  This demonstrated normal LV systolic function with an LVEF of  55-60%.  There was mild increase in aortic valve thickness but no  significant aortic stenosis.  The right ventricle was mildly dilated.   MEDICATIONS:  1. Nexium 40 mg daily.  2. Lipitor 10 mg daily.  3. Zoloft 100 mg twice daily.  4. Plavix 75 mg daily.  5. Calcium 500 mg twice daily.  6. Vitamin D 1 g daily.   ALLERGIES:  SULFA, PERCOCET, CELEBREX and FOSAMAX.   PHYSICAL EXAMINATION:  GENERAL:  The patient is alert and oriented.  She  is not in acute distress.  VITAL SIGNS:  Weights 207 pounds, blood pressure 128/80, heart rate 72,  respiratory rate 16.  HEENT:  Normal.  NECK:  Normal carotid upstrokes.   No bruits.  JVP normal.  LUNGS:  Clear bilaterally.  HEART:  Regular rate and rhythm.  No murmurs or gallops.  ABDOMEN:  Soft, nontender.  No organomegaly.  EXTREMITIES:  No clubbing, cyanosis or edema.  Peripheral pulses are  intact and equal throughout.   EKG shows normal sinus rhythm with PVCs.   ASSESSMENT:  [name] [name] is a 76 year old woman with  hypercholesterolemia and cerebrovascular disease with prior stroke, now  presenting with chest pressure and dyspnea.  Her exam is benign and it  does not suggest heart failure.  She has had negative stress studies in  the past.  However, it has been over 2 years since she has had a stress  test.  We will schedule her for an adenosine Myoview stress scan to rule  out significant ischemia.  Further plans pending the results of her  stress study.     [name]. Excell Seltzer, MD  Electronically Signed    MDC/MedQ  DD: 09/21/2007  DT: 09/22/2007  Job #:  [id number]   cc:   Marne A. Milinda Antis, MD

## 2010-06-01 NOTE — Assessment & Plan Note (Signed)
[facility]                            CARDIOLOGY OFFICE NOTE   NAME:[name], [name]                    MRN:          [id number]  DATE:09/14/2006                            DOB:          February 24, 1934    [name] [name] returns for followup at the [facility] office on  September 14, 2006.  She is a delightful 76 year old woman with a history  of atypical chest pain and cardiac palpitations.  She has undergone an  adenosine Myoview study back in 2007 that showed no evidence of  ischemia, as well as normal left ventricular function with an LVEF of  70%.  She has undergone Holter monitoring that demonstrated sinus rhythm  with frequent PVCs and episodes of ventricular bigeminy.  We tried to  suppress her PVCs with long-acting metoprolol, but she was intolerant  due to headaches.  She informs me that knowing that her PVCs are benign  makes them tolerable.  She still has palpitations when she is resting  quietly, but she has no other symptoms.  She denies any exertional chest  pain or dyspnea.  She has not had light-headedness or syncope.  She is  minimizing coffee and does not feel that her palpitations are  particularly related to caffeine intake.  She has no other complaints at  this time.   MEDICATIONS:  1. Aspirin 325 mg daily.  2. Nexium 40 mg daily.  3. Lipitor 10 mg daily.  4. Meloxicam 7.5 mg daily.  5. Zoloft 100 mg twice daily.  6. Viactiv.   ALLERGIES:  SULFA.   PHYSICAL EXAM:  The patient is alert and oriented.  She is in no acute  distress.  Weight is 204 pounds, blood pressure 118/62, heart rate 65, respiratory  rate is 16.  HEENT:  Normal.  NECK:  Normal carotid upstrokes without bruits.  Jugular venous pressure  is normal.  LUNGS:  Clear to auscultation bilaterally.  HEART:  Regular rate and rhythm without murmurs or gallops.  ABDOMEN:  Soft and nontender.  No bruits.  No organomegaly.  EXTREMITIES:  No cyanosis, clubbing,  or edema.  Peripheral pulses are 2+  and equal.   ASSESSMENT:  [name] [name] is a 76 year old woman with benign  palpitations.  She was intolerant to beta blockade.  She was reassured  today regarding her symptoms.  She should  continue to stay well-hydrated and minimize caffeine.  She was  encouraged obtaining regular exercise.  I will plan on seeing her back  on a yearly basis.     [name]. Excell Seltzer, MD  Electronically Signed    MDC/MedQ  DD: 09/14/2006  DT: 09/15/2006  Job #: [id number]   cc:   Marne A. Milinda Antis, MD

## 2010-06-01 NOTE — Assessment & Plan Note (Signed)
[facility]                                 ON-CALL NOTE   NAME:Cookson, Gurpreet                      MRN:          [id number]  DATE:09/05/2006                            DOB:          Sep 30, 1934    PHONE NUMBER:  [phone]   PRIMARY CARE PHYSICIAN:  Marne A. Tower, MD.   SUBJECTIVE:  Ms. Mulvihill was shaving earlier in the day and nicked a  vein on her mid lower leg.  She was able to get it to stop bleeding, but  later this evening when she was putting on lotion, she knocked off the  clot.  She had significant amounts of blood spurting from her leg and  had some difficulty getting it to stop bleeding.   ASSESSMENT/PLAN:  Instructed her to apply pressure, use ice and  elevation.  The bleeding stopped.  Instructed her on how to apply  pressure dressing along with an ACE bandage.  She was instructed to hold  her aspirin.     Kerby Nora, MD  Electronically Signed    AB/MedQ  DD: 09/05/2006  DT: 09/06/2006  Job #: [id number]

## 2010-06-01 NOTE — Assessment & Plan Note (Signed)
[facility]                            CARDIOLOGY OFFICE NOTE   NAME:Million, TACI STERLING                    MRN:          [id number]  DATE:06/30/2006                            DOB:          [birth year]    [name] [name] was seen in followup at the [facility] Cardiology office on  June 30, 2006.  She is a 76 year old woman, presenting for evaluation of  palpitations.  She was seen by Dr. Glennon Hamilton in March of 2007 for  atypical chest pain and had an adenosine Myoview study, which had a low-  risk result.   Her chest pain has resolved, but she complains of a constant awareness  of her heartbeat.  She really does not have true palpitations in the  sense of feeling skipped heartbeats or a racing heart, but does feel her  heart beat in her chest constantly during the waking hours.  At night,  when she falls asleep, she does not awaken with any symptoms.  She has  no exertional symptoms.  She specifically denies exertional chest pain  or dyspnea.  She has occasional light-headedness when standing up  quickly, but no other episodes of light-headedness or near-syncope.  She  has no edema, orthopnea, PND or other complaints.  Her symptoms are not  related to caffeine intake, food intake, fatigue or activity level.   CURRENT MEDICATIONS INCLUDE:  1. Aspirin 325 mg daily.  2. Nexium 40 mg daily.  3. Lipitor 10 mg daily.  4. Meloxicam 7.5 mg daily.  5. Zoloft 100 mg twice daily.  6. Viactiv.   ALLERGIES:  SULFA and FOSAMAX.   PHYSICAL EXAMINATION:  The patient is alert and oriented.  She is in no  acute distress.  Blood pressure 104/70, heart rate 61, respiratory rate  16.  HEENT:  Normal.  NECK:  Normal carotid upstrokes without bruits.  Jugular venous pressure  is normal.  LUNGS:  Clear to auscultation bilaterally.  HEART:  Regular rate and rhythm without murmurs or gallops.  ABDOMEN:  Soft, nontender, no organomegaly.  Bowel sounds are present.  EXTREMITIES:  No clubbing, cyanosis or edema.  Peripheral pulses are 2+  and equal throughout.   EKG:  Shows normal sinus rhythm and is within normal limits.  QRS  voltage is borderline.   ASSESSMENT:  [name] [name] is a 76 year old woman with palpitations.  She is not having any high-risk symptoms of angina or syncope.  She  appears to have a high level of anxiety.  I think she may be helped  symptomatically by the addition of a low-dose beta blocker.  Prior to  starting that, I would like to check a 24-hour Holter monitor.  Once we  have the monitor results, I will likely give her a trial of extended-  release metoprolol 25 mg daily, if she would like to try this.  I  reassured her today that I thought her symptoms were benign.   I will plan on seeing Ms. Farrugia back on a yearly basis, or sooner if  any new problems arise.  We will be in touch with her in  the near future  regarding her Holter results.     [name]. Excell Seltzer, MD  Electronically Signed    MDC/MedQ  DD: 06/30/2006  DT: 06/30/2006  Job #: [id number]   cc:   Marne A. Milinda Antis, MD

## 2010-06-01 NOTE — H&P (Signed)
NAMELYSBETH, DICOLA             ACCOUNT NO.:  [id number]   MEDICAL RECORD NO.:  [id number]          PATIENT TYPE:  EMS   LOCATION:  MAJO                         FACILITY:  [facility]   PHYSICIAN:  Bevelyn Buckles. [place], M.D.DATE OF BIRTH:  03/03/1934   DATE OF ADMISSION:  04/25/2007  DATE OF DISCHARGE:                              HISTORY & PHYSICAL   REQUESTING PHYSICIAN:  Dr. Roseanna Rainbow.   REASON FOR ADMISSION:  Code stroke.   HISTORY OF PRESENT ILLNESS:  Ms. Matney is a 76 year old Caucasian  female with multiple medical problems who presents after acute onset at  5:00 p.m. this evening of left upper extremity heaviness and left lower  extremity tingling, causing gait instability.  Her symptoms lasted about  45-60 minutes and then completely resolved.  She has no other  complaints.  She denies any vision changes, speech changes, swallowing  problems, chewing problems, dizziness, or loss of consciousness.   PAST MEDICAL HISTORY:  Positive for high cholesterol, stroke,  depression.   CURRENT MEDICATIONS:  Zoloft, Mobic, aspirin, and Lipitor.   ALLERGIES:  SULFA.   FAMILY HISTORY:  Positive for diabetes, heart disease, and strokes.   SOCIAL HISTORY:  Patient lives alone.  Denies any tobacco, alcohol, or  drug use.   REVIEW OF SYSTEMS:  Positive, as per HPI.  Review of systems negative as  per HPI in greater than seven other organ systems.   PHYSICAL EXAMINATION:  VITALS:  Temperature is 97, blood pressure  146/92, heart rate 70, respirations 20.  HEENT:  Normocephalic and atraumatic.  Extraocular muscles are intact.  Pupils are equal, round and reactive to light.  NECK:  Supple.  HEART:  Regular.  LUNGS:  Clear.  ABDOMEN:  Soft, nontender.  EXTREMITIES:  Good pulses.  NEUROLOGIC:  Patient is awake, alert and oriented.  Language is fluent.  Cranial nerves II-XII are grossly intact.  Motor examination shows good  strength in all four extremities.  No drift is noted.  Sensory  examination is within normal to light touch and pinprick.  Reflexes are  1-2+ throughout and symmetric.  Cerebellar function is within normal  limits on finger-nose and heel-shin.  Gait was not assessed secondary to  safety and a stroke scale of 0.   CT head showed an old right MCA infarct with no acute findings.   PT is 13.  INR is 1.  PTT is 27.  WBCs are 8.1, hemoglobin 13.1,  hematocrit 38.5, platelets are 268.   IMPRESSION:  This is a 76 year old Caucasian female with transient left-  sided weakness and tingling, which has now resolved, consistent with  transient ischemic attack.  Patient is not a candidate for t-PA, as her  symptoms have resolved.  We will admit the patient for stroke  precautions.  We will change her aspirin to Plavix.  Check an MRI/MRA of  the brain.  Carotid Dopplers, 2D echocardiogram.  We will check an EEG  to rule out seizure activity, given her prior stroke.  We will check  lipids and homocysteine level.  We will get a PT/OT consult.  We will  follow the  patient while she is in the hospital.      Bevelyn Buckles. Nash Shearer, M.D.  Electronically Signed     DRC/MEDQ  D:  04/25/2007  T:  04/25/2007  Job:  [id number]

## 2010-06-01 NOTE — Assessment & Plan Note (Signed)
[facility]                            CARDIOLOGY OFFICE NOTE   NAME:[name], [name] D                    MRN:          [id number]  DATE:09/14/2006                            DOB:          1934/11/14      [name]. Excell Seltzer, MD  Electronically Signed    MDC/MedQ  DD: 09/14/2006  DT: 09/15/2006  Job #: [id number]

## 2010-06-01 NOTE — Assessment & Plan Note (Signed)
[facility]                            CARDIOLOGY OFFICE NOTE   NAME:[name] [name]                    MRN:          [id number]  DATE:08/02/2006                            DOB:          [birth year]    CARDIOLOGIST:  Dr. Tonny Bollman.   PRIMARY CARE PHYSICIAN:  Dr. Roxy Manns.   HISTORY OF PRESENT ILLNESS:  [name] is a 76 year old female  patient, who has a history of atypical chest pain evaluated with an  adenosine Myoview study March of 2007 that revealed normal LV  functioning up to 70%.  The stress study was low risk with reversible  apical defect that could be due to shifting breast attenuation, although  a small area of ischemia could not be ruled out.  Again, this was felt  to be a low-risk study.  She reported to Dr. Excell Seltzer on June 13th with  complaints of palpitations.  He set her up with a Holter monitor.  This  returned revealing sinus rhythm with frequent ventricular ectopy with  ventricular bigeminy, rare couplets and rare supraventricular ectopy and  no sustained arrhythmias.  She returns today for followup.  She notes  she is doing well, denies any chest pain, shortness of breath, syncope  or near-syncope.  She notes the continued palpitations.  This is just an  awareness of a heart beat that seems to be harder than normal.  She  drinks two cups of coffee per day.  She has been under quite a bit of  stress recently, as well.  Denies any over the counter stimulant use.   CURRENT MEDICATIONS:  1. Aspirin 325 mg per day.  2. Nexium 40 mg a day.  3. Lipitor 10 mg a day.  4. Meloxicam 7.5 mg daily.  5. Zoloft 20 mg b.i.d.  6. Viactiv.  7. Tylenol p.r.n.   ALLERGIES:  SULFA AND FOSAMAX.   PHYSICAL EXAMINATION:  GENERAL:  She is a well-nourished, well-developed  female in no distress.  Blood pressure is 114/69, pulse 64, weight 205  pounds.  HEENT:  Normal.  NECK:  Without JVD.  CARDIAC:  S1, S2, regular rate and  rhythm.  LUNGS:  Clear to auscultation bilaterally.  ABDOMEN:  Soft, nontender.  EXTREMITIES:  Without edema.  ENDOCRINE:  Without thyromegaly.   IMPRESSION:  1. Palpitations.      a.     Symptomatic premature ventricular contractions.  2. History of atypical chest pain.      a.     Non-ischemic adenosine Myoview, March 2007.  3. Good LV function.  4. History of CVA 1998 without residual.  5. Treated dyslipidemia.  6. Osteoarthritis.  7. Gastroesophageal reflux disease.  8. Significant social stressors.   PLAN:  The patient presents to the office today for followup on her  Holter monitor.  Of note, she has frequent ventricular ectopy and  bigeminy.  I suspect her palpitations are secondary to symptomatic PVCs.  Dr. Excell Seltzer had recommended in his note from June 13th possibly starting  her on low-dose beta blocker therapy.  I have discussed this at length  with her  today, and we have agreed to start on extended-release  metoprolol 25 mg a day.  This may also help with some of her anxiety.  She may indeed need a benzodiazepine prescription from her primary care  physician.  She should follow up with Dr. Milinda Antis for this.  I have also  recommended we check a BMET, magnesium, TSH.  She will return in about 4  to 6 weeks with either Dr. Excell Seltzer or myself, on a day that Dr. Excell Seltzer is  here to assess her symptoms.  She should follow up sooner p.r.n.      Tereso Newcomer, PA-C  Electronically Signed      Duke Salvia, MD, [facility]  Electronically Signed   SW/MedQ  DD: 08/02/2006  DT: 08/03/2006  Job #: [id number]   cc:   Marne A. Milinda Antis, MD

## 2010-06-01 NOTE — Discharge Summary (Signed)
NAME:  [name], [name]             ACCOUNT NO.:  [id number]   MEDICAL RECORD NO.:  [id number]         PATIENT TYPE:  INP   LOCATION:  3022                         FACILITY:  [facility]   PHYSICIAN:  Pramod P. Pearlean Brownie, MD    DATE OF BIRTH:  [birth year]   DATE OF ADMISSION:  04/25/2007  DATE OF DISCHARGE:  04/26/2007                               DISCHARGE SUMMARY   DIAGNOSES AT TIME OF DISCHARGE.:  1. Transient ischemic attack.  2. Hypercholesterolemia.  3. History of right major coronary artery infarct.  4. Depression.   MEDICINES AT TIME OF DISCHARGE:  1. Zoloft 100 mg a day.  2. Mobic 7.5 mg a day.  3. Lipitor 10 mg a day.  4. Nexium 40 mg a day.  5. Plavix 75 mg a day.   STUDIES PERFORMED.:  1. CT of the brain on admission shows remote right MCA distribution      infarct, no acute abnormality.  2. MRI of the brain shows old right hemispheric infarct.  Question      tiny acute infarct, posterior right frontal and parietal lobe.      Right mastoid air cells and paranasal sinus mucosal thickening.  3. MRA of the head shows motion degraded exam with decreased number of      visualized right MCA branch vessels consistent with prior stroke.  4. Carotid Doppler shows no ICA stenosis.  5. 2-D echocardiogram shows no embolic source, EF normal.  6. EKG shows universal P axis, possible ectopic atrial rhythm with      frequent and consecutive premature ventricular and fusion      complexes, left posterior fascicular block, cannot rule out      inferior infarct age undetermined.  Suspect arm lead reversal.  7. EEG, no seizure activity.   LABORATORY STUDIES:  CBC normal chemistry with glucose 107, otherwise  normal.  Coags normal.  Liver function tests normal.  Cardiac enzymes  negative x2.  Cholesterol 158, triglycerides 57, HDL 57, and LDL 90.  Homocystine is pending.  Hemoglobin A1c 5.9.   HISTORY OF PRESENT ILLNESS:  [name] is a 76 year old right-  handed Caucasian female  with a history of old right MCA infarct with no  residual neurologic deficits.  She presents after acute onset at 5 p.m.,  the night of admission, the left upper extremity heaviness and tingling  with a gait instability that lasted approximately 45-60 minutes and then  resolved.  She had no other complaints.  She was brought to the  emergency room.  TPA was not given secondary to rapid improvement of  symptoms.  She was admitted to the hospital for further stroke  evaluation.   HOSPITAL COURSE:  MRI was negative for acute stroke.  Carotid Doppler, 2-  D echocardiogram, and EEG were all normal.  Her neurologic symptoms did  remain completely resolved, though she had additional numbness and  weakness that lasted 2-6 hours earlier in the evening.  She was on  aspirin prior to admission and changed to Plavix for secondary stroke  prevention.  She needs ongoing risk factor control as was obtained  prior  to admission by her primary care physician and these are managed well.  We will consider IRS study at time of discharge using Actos as a  possible stroke preventing medication.  Dr. Pearlean Brownie will follow up with  this.   CONDITION AT TIME OF DISCHARGE:  Alert and oriented x3.  No aphasia.  No  dysarthria.  Eye movements are full.  Face is symmetric.  No drift.  She  has no focal weakness.  Her gait is steady.   DISCHARGE PLAN:  1. Discharged home with family.  2. Plavix for secondary stroke prevention.  3. Ongoing risk factor control by primary care physician.  4. Follow up with Dr. Roxy Manns within 1 month of discharge.  5. Follow up with Dr. Delia Heady in 2-3 months of discharge.  6. Consider IRS trial at the time of followup.      Annie Main, N.P.    ______________________________  Sunny Schlein. Pearlean Brownie, MD    SB/MEDQ  D:  04/26/2007  T:  04/27/2007  Job:  [id number]   cc:   Marne A. Tower, MD  Pramod P. Pearlean Brownie, MD

## 2010-06-01 NOTE — Procedures (Signed)
EEG NUMBER:  [id number].   HISTORY:  This is a 76 year old patient with a history of  cerebrovascular disease with possible TIA versus stroke.  The patient  had transient left arm weakness and leg weakness.  The patient is being  evaluated for possible seizure-type event.   MEDICATIONS:  Include Plavix, Zoloft, Mobic, Zocor, Lovenox, Reglan,  Tylenol and Xanax.   EEG CLASSIFICATION:  Normal awake.   DESCRIPTION:  Background rhythm:  This recording consists of fairly well  modulated medium amplitude alpha rhythm of 9 Hz that is reactive to eye  opening and closure.  As the record progresses, the patient appears to  remain in the waking state throughout the recording.  Photic stimulation  is performed resulting in bilateral and symmetric photic drive response.  Hyperventilation is also performed, resulting in a minimal buildup of  background rhythm activity without significant slowing seen.  At no time  during the recording does there appear to be evidence of spike or spike  wave discharges or evidence of focal slowing.  EKG monitor shows no  evidence of cardiac rhythm abnormalities with exception of occasional  premature ventricular complexes.  Heart rate is 60.   IMPRESSION:  This is a normal EEG recording in the waking state.  No  evidence of ictal or interictal discharges were seen.      Marlan Palau, M.D.  Electronically Signed     ION:GEXB  D:  04/26/2007 11:24:12  T:  04/26/2007 11:38:55  Job #:  [id number]

## 2010-06-04 NOTE — Discharge Summary (Signed)
[name] [name], [name] [name]                       ACCOUNT NO.:  [id number]   MEDICAL RECORD NO.:  [id number]                   PATIENT TYPE:  INP   LOCATION:  4706                                 FACILITY:  [facility]   PHYSICIAN:  Isla Pence, M.D. [facility]         DATE OF BIRTH:  [birth year]   DATE OF ADMISSION:  08/27/2001  DATE OF DISCHARGE:  08/28/2001                                 DISCHARGE SUMMARY   DISCHARGE DIAGNOSES:  1. Atypical chest pain.  Was ruled-out for myocardial infarction by cardiac     enzymes and also had a negative Cardiolite on August 28, 2001.  2. Anxiety disorder with social stresses in the family.   DISCHARGE MEDICATIONS:  Patient is to continue on all of her home  medications, consisting of Zoloft 100 mg p.o. q.d., Ecotrin 325 mg p.o.  q.d., Tylenol Arthritis P.M., Nexium 40 mg p.o. q.d.   DISCHARGE ACTIVITIES:  As tolerated.   DISCHARGE DIET:  No restrictions.   DISCHARGE FOLLOW UP:  Follow up with Dr. Roxy Manns in 2 to 3 weeks.   HOSPITAL COURSE:  This is a 76 year old white female with a prior history of  right CVA with no underlying causes apparently found for that CVA, with no  history of hypertension, coronary artery disease in the past, or  hyperlipidemia.  She was admitted for atypical chest pain that came on  mostly at rest.  She did not have any associated symptoms until the day of  admission, when she had some nausea with her chest pain.  Patient was  admitted for the rule-out MI protocol, placed on telemetry.  I did not start  her on heparin because her initial CPK was in the 40 range, and her symptoms  were very atypical.  The patient was subsequently ruled out for myocardial  infarction by cardiac enzymes, and subsequently underwent Cardiolite stress  test which has come back negative.  Clinically, her story was not strongly  suggestive of cardiac history at all.  Her EKG was fairly benign, which  showed normal sinus rhythm, no acute ST-T  wave changes.  On further  discussion with the family, the husband, and the patient, there is quite a  bit of stresses in the family related to the death of their daughter in 73  and subsequently unable to see their 5 grandchildren because of the current  relationship that their former son-in-law is in.  I gave them some  suggestions.  I think a lot of her complaints are related to the stresses  that are going on in her life.   Patient is being discharged home in stable condition with followup as  previously mentioned.  Isla Pence, M.D. [facility]    RRV/MEDQ  D:  08/28/2001  T:  09/01/2001  Job:  [id number]   cc:   Marne A. Milinda Antis, M.D. [facility]

## 2010-06-04 NOTE — Assessment & Plan Note (Signed)
Tuesday, December 20, 2005:   [name] [name] is back regarding her costochondritis. She had increased pain  after the last set of injections, although she has gone back down to  baseline. She is still having significant pain with movement. She uses  Mobic as an antiinflammatory with fair results. Lidoderm patch helps  topically. She rates her pain as an 8-out-of-10 today. She describes  pain as dull and constant. Pain interferes with general activity,  relationships with others and enjoyment of life on a moderate-to-severe  level.   REVIEW OF SYSTEMS:  The patient denies any new neurological,  psychiatric, constitutional, GU, GI, cardiorespiratory complaints today.   SOCIAL HISTORY:  The patient has been busy with home issues, upkeep,  etc.   PHYSICAL EXAMINATION:  Blood pressure is 140/68, pulse is 70,  respiratory rate 18, she is satting 94% on room air.  The patient is pleasant and in no acute distress. She is alert and  oriented x3. Affect is bright and appropriate. Gait is stable.  Coordination is fair.  Heart is regular rate and rhythm.  Lungs are clear.  Abdomen is soft, nontender.  The patient has significant tenderness along the right third rib at the  costochondral junction. There was some tenderness at the level above.  The area appeared to be slightly sclerotic compared to the left side. No  redness or skin discoloration was appreciated.  Motor and sensory exam was all within normal limits. Cognitively she is  appropriate.   ASSESSMENT:  Costochondritis of the right third rib.   PLAN:  1. Set patient up for steroid phonophoresis and iontophoresis with      physical therapy. Also work on postural and shoulder girdle      assessment.  2. Continue Mobic.  3. Continue Lidoderm patch.  4. Add Vicodin 5/500 for breakthrough pain one-half to one q.12 hours      p.r.n.  5. Use ice to the area.  6. I will see the patient back in about 4-6 weeks time.      Ranelle Oyster,  M.D.  Electronically Signed     ZTS/MedQ  D:  12/20/2005 12:32:56  T:  12/20/2005 13:29:18  Job #:  [id number]   cc:   Marne A. Tower, MD  [address] [place], [place] [address]

## 2010-06-04 NOTE — Assessment & Plan Note (Signed)
HISTORY:  The patient is here in follow-up of her chest pain.  We performed  a costochondral injection last visit.  She states she had about two days of  relief and then pain returned.  She does like her Lidoderm patch somewhat  for pain.  She uses Mobic for anti-inflammatory effects.  She has been  involved in a move the last two weeks and this seems to have been increasing  her chest pain somewhat.  She rates her pain as a 9 out of 10 in fact today.  She describes pain as a dull aching and constant.  Pain interferes with  general activity, relationship with others and enjoyment of life on a  moderate level.  Sleep is poor.   REVIEW OF SYSTEMS:  The patient denies any new neurological, psychiatric,  constitutional, GU, GI, cardiorespiratory complaints today.   SOCIAL HISTORY:  Pertinent positives listed above.   PHYSICAL EXAMINATION:  VITAL SIGNS:  Blood pressure is 145/61, pulse is 72,  respirations 16.  She is sating 96% on room air.  GENERAL:  The patient is pleasant, no acute distress.  She is alert and  oriented x3.  Affect is bright and appropriate.  Gait is stable.  Coordination is fair.  HEART:  Regular.  CHEST:  Clear.  ABDOMEN:  Soft, nontender.  NEUROLOGIC:  Motor function is 5/5.  Sensation is normal.  The patient  continues to have pain along the third rib at the costochondral junction.  The area was very tender to palpation today.  No redness or skin  discoloration were witnessed today.   ASSESSMENT:  Costochondritis of the right third rib.   PLAN:  1. Continue Mobic.  2. Continue Lidoderm patch.  3. After informed consent we re-injected the costochondral area with 1.5      mL of 1% lidocaine and 20 mg of Kenalog.  The patient tolerated the      procedure well.  4. Use a little ice to the area.  5. I will see the patient back in one month time.  Consider steroids,      phonophoresis or iontophoresis.      Ranelle Oyster, M.D.  Electronically  Signed     ZTS/MedQ  D:  11/22/2005 15:39:08  T:  11/23/2005 08:16:23  Job #:  [id number]   cc:   Marne A. Tower, MD  [address] [place], [place] [address]
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
## 2010-06-07 ENCOUNTER — Telehealth: Payer: Self-pay | Admitting: *Deleted

## 2010-06-07 NOTE — Telephone Encounter (Signed)
Will do ref and route to [name] 

## 2010-06-07 NOTE — Telephone Encounter (Signed)
Pt would like a referral to urologist in [place].  She says the detrol is not working for her like it should, even if she takes it at night.

## 2010-06-09 NOTE — Telephone Encounter (Signed)
Appt Dr Patsi Sears  On 07/19/2010 at 12:45pm. [facility]

## 2010-06-21 ENCOUNTER — Encounter: Payer: Self-pay | Admitting: Cardiovascular Disease

## 2010-06-21 ENCOUNTER — Ambulatory Visit (INDEPENDENT_AMBULATORY_CARE_PROVIDER_SITE_OTHER): Payer: Medicare Other | Admitting: Cardiovascular Disease

## 2010-06-21 VITALS — BP 118/70 | HR 78 | Resp 18 | Ht 65.0 in | Wt 206.4 lb

## 2010-06-21 DIAGNOSIS — R0609 Other forms of dyspnea: Secondary | ICD-10-CM

## 2010-06-21 DIAGNOSIS — R0602 Shortness of breath: Secondary | ICD-10-CM

## 2010-06-21 NOTE — Assessment & Plan Note (Addendum)
Recommended 2-D echocardiogram to evaluate for either systolic or LV diastolic dysfunction. She does not have typical findings of congestive heart failure on examination. She is having no ischemic symptoms and her EKG is normal. Her last echo was in 2009 for evaluation of a TIA and that showed no significant structural heart disease at that time.  If her echo is unremarkable, I don't think she needs further evaluation before surgery. Her overall cardiovascular risk with surgery is low. The patient had a Myoview stress scan in 2009 demonstrating no significant ischemia.

## 2010-06-21 NOTE — Patient Instructions (Signed)
Schedule an appointment for an echocardiogram.  Your physician wants you to follow-up in: 1 year with Dr Excell Seltzer. (June 2013).You will receive a reminder letter in the mail two months in advance. If you don't receive a letter, please call our office to schedule the follow-up appointment.

## 2010-06-21 NOTE — Progress Notes (Signed)
HPI:  This is a 76 year old woman presenting for followup evaluation. The patient has a history of chest pain, palpitations, and prior TIA. She had lumbar disc surgery approximately one year ago and will require a repeat procedure for further disc fusion. She continues to have low back pain with some weakness of her left leg. She also has developed a prolapsed bladder and is going to have urologic evaluation next month.  From a cardiac standpoint, the patient is doing fairly well. She denies chest pain or pressure. She's had no further palpitations, lightheadedness, syncope, or edema. She does complain of shortness of breath with activity and also dyspnea occurring with taking a hot shower or with prolonged talking. The symptoms are more noticeable than they were last year.  She denies orthopnea or PND. She's had no pleuritic chest pain. She denies cough, fever, or chills.  Outpatient Encounter Prescriptions as of 06/21/2010  Medication Sig Dispense Refill  . ALPRAZolam (XANAX) 0.5 MG tablet Take 0.5 mg by mouth 2 (two) times daily as needed.        Marland Kitchen atorvastatin (LIPITOR) 10 MG tablet Take 10 mg by mouth daily.        . Cholecalciferol (VITAMIN D) 1000 UNITS capsule Take 1,000 Units by mouth daily.        . clopidogrel (PLAVIX) 75 MG tablet Take 1 tablet (75 mg total) by mouth daily.  90 tablet  3  . esomeprazole (NEXIUM) 40 MG capsule Take 40 mg by mouth 2 (two) times daily before a meal.        . HYDROcodone-acetaminophen (NORCO) 10-325 MG per tablet 1-2 tablets by mouth every 8 hours as needed for pain.      . mupirocin (BACTROBAN) 2 % ointment Apply topically 2 (two) times daily.        . raloxifene (EVISTA) 60 MG tablet Take 60 mg by mouth daily.        . sertraline (ZOLOFT) 100 MG tablet Take 100 mg by mouth 2 (two) times daily.        Marland Kitchen tolterodine (DETROL LA) 4 MG 24 hr capsule Take 1 capsule (4 mg total) by mouth daily.  90 capsule  3  . DISCONTD: Calcium-Vitamin D-Vitamin K 500-500-40  MG-UNT-MCG CHEW Chew by mouth 2 (two) times daily.          Allergies  Allergen Reactions  . Alendronate Sodium     REACTION: JAW PAIN  . Celecoxib     REACTION: GI UPSET  . Latex   . Oxycodone-Acetaminophen     REACTION: TOO STRONG  . Sulfonamide Derivatives     Past Medical History  Diagnosis Date  . Painful respiration   . Personal history of unspecified circulatory disease   . Palpitations   . Shortness of breath   . Other and unspecified hyperlipidemia   . Esophageal reflux   . Depressive disorder, not elsewhere classified   . Anxiety state, unspecified   . Unspecified cerebral artery occlusion with cerebral infarction   . Unspecified arthropathy, multiple sites   . Personal history of other diseases of digestive system   . Diaphragmatic hernia without mention of obstruction or gangrene   . External hemorrhoids without mention of complication   . Diverticulosis of colon (without mention of hemorrhage)   . Diarrhea   . Special screening for malignant neoplasms of other sites   . Unspecified vitamin D deficiency   . Other malaise and fatigue   . Disorder of bone and cartilage, unspecified   .  Helicobacter pylori (H. pylori)   . Macular degeneration (senile) of retina, unspecified   . Osteoarthrosis, unspecified whether generalized or localized, unspecified site     ROS: Negative except as per HPI  BP 118/70  Pulse 78  Resp 18  Ht 5\' 5"  (1.651 m)  Wt 206 lb 6.4 oz (93.622 kg)  BMI 34.35 kg/m2  PHYSICAL EXAM: Pt is alert and oriented, NAD HEENT: normal Neck: JVP - normal, carotids 2+= without bruits Lungs: CTA bilaterally CV: RRR without murmur or gallop Abd: soft, NT, Positive BS, no hepatomegaly Ext: no C/C/E, distal pulses intact and equal Skin: warm/dry no rash  EKG:  Normal sinus rhythm 75 beats per minute, within normal limits.  ASSESSMENT AND PLAN:

## 2010-06-28 ENCOUNTER — Other Ambulatory Visit: Payer: Self-pay | Admitting: *Deleted

## 2010-06-28 MED ORDER — ALPRAZOLAM 0.5 MG PO TABS: 0.5000 mg | ORAL_TABLET | Freq: Two times a day (BID) | ORAL | Status: DC | PRN

## 2010-06-28 NOTE — Telephone Encounter (Signed)
Px written for call in   

## 2010-06-29 ENCOUNTER — Ambulatory Visit (HOSPITAL_COMMUNITY): Payer: Medicare Other | Attending: Family Medicine

## 2010-06-29 DIAGNOSIS — R072 Precordial pain: Secondary | ICD-10-CM | POA: Insufficient documentation

## 2010-06-29 DIAGNOSIS — R0989 Other specified symptoms and signs involving the circulatory and respiratory systems: Secondary | ICD-10-CM | POA: Insufficient documentation

## 2010-06-29 DIAGNOSIS — R0602 Shortness of breath: Secondary | ICD-10-CM

## 2010-06-29 DIAGNOSIS — I080 Rheumatic disorders of both mitral and aortic valves: Secondary | ICD-10-CM | POA: Insufficient documentation

## 2010-06-29 DIAGNOSIS — R0609 Other forms of dyspnea: Secondary | ICD-10-CM | POA: Insufficient documentation

## 2010-06-29 DIAGNOSIS — R002 Palpitations: Secondary | ICD-10-CM | POA: Insufficient documentation

## 2010-06-29 DIAGNOSIS — I079 Rheumatic tricuspid valve disease, unspecified: Secondary | ICD-10-CM | POA: Insufficient documentation

## 2010-06-29 NOTE — Telephone Encounter (Signed)
Medication phoned to [facility] pharmacy as instructed.  

## 2010-10-01 ENCOUNTER — Other Ambulatory Visit: Payer: Self-pay | Admitting: *Deleted

## 2010-10-01 MED ORDER — ALPRAZOLAM 0.5 MG PO TABS: 0.5000 mg | ORAL_TABLET | Freq: Two times a day (BID) | ORAL | Status: DC | PRN

## 2010-10-01 NOTE — Telephone Encounter (Signed)
Medication phoned to pharmacy.  

## 2010-10-01 NOTE — Telephone Encounter (Signed)
Px written for call in   

## 2010-10-12 LAB — DIFFERENTIAL
Basophils Relative: 1
Eosinophils Absolute: 0.4
Monocytes Relative: 8
Neutrophils Relative %: 48

## 2010-10-12 LAB — COMPREHENSIVE METABOLIC PANEL
ALT: 17
Alkaline Phosphatase: 81
CO2: 24
Chloride: 107
GFR calc non Af Amer: >60
Glucose, Bld: 107 — ABNORMAL HIGH
Potassium: 4.1
Sodium: 140
Total Protein: 6.7

## 2010-10-12 LAB — LIPID PANEL
HDL: 57
Total CHOL/HDL Ratio: 2.8
Triglycerides: 57

## 2010-10-12 LAB — CBC
Hemoglobin: 13.1
RBC: 4.51
WBC: 8.1

## 2010-10-12 LAB — POCT I-STAT, CHEM 8
Calcium, Ion: 1.15
Chloride: 107
Glucose, Bld: 105 — ABNORMAL HIGH
HCT: 42

## 2010-10-12 LAB — CARDIAC PANEL(CRET KIN+CKTOT+MB+TROPI): Troponin I: 0.03

## 2010-10-12 LAB — PROTIME-INR: INR: 1

## 2010-10-12 LAB — CK TOTAL AND CKMB (NOT AT ARMC)
CK, MB: 3
Relative Index: 3 — ABNORMAL HIGH
Total CK: 100

## 2010-10-12 LAB — HOMOCYSTEINE: Homocysteine: 9

## 2010-10-14 ENCOUNTER — Other Ambulatory Visit: Payer: Self-pay | Admitting: Neurosurgery

## 2010-10-14 DIAGNOSIS — M7072 Other bursitis of hip, left hip: Secondary | ICD-10-CM

## 2010-10-18 ENCOUNTER — Ambulatory Visit
Admission: RE | Admit: 2010-10-18 | Discharge: 2010-10-18 | Disposition: A | Discharge status: Home / Self Care | Payer: Medicare Other | Source: Ambulatory Visit | Attending: Neurosurgery | Admitting: Neurosurgery

## 2010-10-18 DIAGNOSIS — M7072 Other bursitis of hip, left hip: Secondary | ICD-10-CM

## 2010-10-18 MED ORDER — METHYLPREDNISOLONE ACETATE 40 MG/ML INJ SUSP (RADIOLOG
120.0000 mg | Freq: Once | INTRAMUSCULAR | Status: AC
  Administered 2010-10-18: 120 mg via INTRA_ARTICULAR

## 2010-10-18 MED ORDER — IOHEXOL 180 MG/ML  SOLN
1.0000 mL | Freq: Once | INTRAMUSCULAR | Status: AC | PRN
  Administered 2010-10-18: 1 mL via INTRA_ARTICULAR

## 2010-12-14 ENCOUNTER — Other Ambulatory Visit: Payer: Self-pay | Admitting: *Deleted

## 2010-12-14 MED ORDER — RALOXIFENE HCL 60 MG PO TABS: 60.0000 mg | ORAL_TABLET | Freq: Every day | ORAL | Status: DC

## 2010-12-14 MED ORDER — SERTRALINE HCL 100 MG PO TABS: 100.0000 mg | ORAL_TABLET | Freq: Two times a day (BID) | ORAL | Status: DC

## 2010-12-14 MED ORDER — ESOMEPRAZOLE MAGNESIUM 40 MG PO CPDR: 40.0000 mg | DELAYED_RELEASE_CAPSULE | Freq: Two times a day (BID) | ORAL | Status: DC

## 2010-12-14 MED ORDER — ATORVASTATIN CALCIUM 10 MG PO TABS: 10.0000 mg | ORAL_TABLET | Freq: Every day | ORAL | Status: DC

## 2010-12-14 NOTE — Telephone Encounter (Signed)
Done- form in IN box

## 2010-12-14 NOTE — Telephone Encounter (Signed)
Pt has brought in forms for [facility] that need to be signed, forms are on your shelf.

## 2010-12-22 NOTE — Telephone Encounter (Signed)
Completed forms faxed to [phone] as instructed.

## 2011-01-25 ENCOUNTER — Other Ambulatory Visit: Payer: Self-pay | Admitting: *Deleted

## 2011-01-25 MED ORDER — ALPRAZOLAM 0.5 MG PO TABS: 0.5000 mg | ORAL_TABLET | Freq: Two times a day (BID) | ORAL | Status: DC | PRN

## 2011-01-25 NOTE — Telephone Encounter (Signed)
Received faxed refill request from pharmacy. Is it okay to refill medication? 

## 2011-01-25 NOTE — Telephone Encounter (Signed)
Px written for call in   

## 2011-01-25 NOTE — Telephone Encounter (Signed)
Medication phoned to [facility] as instructed.  

## 2011-02-23 DIAGNOSIS — Z87440 Personal history of urinary (tract) infections: Secondary | ICD-10-CM | POA: Diagnosis not present

## 2011-02-23 DIAGNOSIS — N952 Postmenopausal atrophic vaginitis: Secondary | ICD-10-CM | POA: Diagnosis not present

## 2011-05-02 ENCOUNTER — Other Ambulatory Visit: Payer: Self-pay | Admitting: Family Medicine

## 2011-05-02 DIAGNOSIS — Z1231 Encounter for screening mammogram for malignant neoplasm of breast: Secondary | ICD-10-CM

## 2011-05-04 ENCOUNTER — Ambulatory Visit
Admission: RE | Admit: 2011-05-04 | Discharge: 2011-05-04 | Disposition: A | Discharge status: Home / Self Care | Payer: Medicare Other | Source: Ambulatory Visit | Attending: Family Medicine | Admitting: Family Medicine

## 2011-05-04 DIAGNOSIS — Z1231 Encounter for screening mammogram for malignant neoplasm of breast: Secondary | ICD-10-CM | POA: Diagnosis not present

## 2011-05-06 ENCOUNTER — Ambulatory Visit (INDEPENDENT_AMBULATORY_CARE_PROVIDER_SITE_OTHER): Payer: Medicare Other | Admitting: Family Medicine

## 2011-05-06 ENCOUNTER — Encounter: Payer: Self-pay | Admitting: Family Medicine

## 2011-05-06 VITALS — BP 120/68 | HR 70 | Temp 97.6°F | Ht 65.5 in | Wt 202.1 lb

## 2011-05-06 DIAGNOSIS — F329 Major depressive disorder, single episode, unspecified: Secondary | ICD-10-CM

## 2011-05-06 DIAGNOSIS — E559 Vitamin D deficiency, unspecified: Secondary | ICD-10-CM

## 2011-05-06 DIAGNOSIS — F411 Generalized anxiety disorder: Secondary | ICD-10-CM | POA: Diagnosis not present

## 2011-05-06 DIAGNOSIS — K909 Intestinal malabsorption, unspecified: Secondary | ICD-10-CM

## 2011-05-06 DIAGNOSIS — R5381 Other malaise: Secondary | ICD-10-CM | POA: Diagnosis not present

## 2011-05-06 DIAGNOSIS — E785 Hyperlipidemia, unspecified: Secondary | ICD-10-CM

## 2011-05-06 DIAGNOSIS — R5383 Other fatigue: Secondary | ICD-10-CM | POA: Diagnosis not present

## 2011-05-06 LAB — CBC WITH DIFFERENTIAL/PLATELET
Basophils Absolute: 0 10*3/uL (ref 0.0–0.1)
Eosinophils Relative: 1.9 % (ref 0.0–5.0)
HCT: 40.2 % (ref 36.0–46.0)
Hemoglobin: 13.7 g/dL (ref 12.0–15.0)
Lymphs Abs: 1.8 10*3/uL (ref 0.7–4.0)
MCV: 90.8 fl (ref 78.0–100.0)
Monocytes Absolute: 0.4 10*3/uL (ref 0.1–1.0)
Monocytes Relative: 8.9 % (ref 3.0–12.0)
Neutro Abs: 2.5 10*3/uL (ref 1.4–7.7)
Platelets: 271 10*3/uL (ref 150.0–400.0)
RDW: 13 % (ref 11.5–14.6)

## 2011-05-06 LAB — LIPID PANEL
HDL: 70.3 mg/dL (ref >39.00)
Triglycerides: 131 mg/dL (ref 0.0–149.0)
VLDL: 26.2 mg/dL (ref 0.0–40.0)

## 2011-05-06 LAB — COMPREHENSIVE METABOLIC PANEL
Alkaline Phosphatase: 53 U/L (ref 39–117)
CO2: 23 mEq/L (ref 19–32)
Creatinine, Ser: 0.9 mg/dL (ref 0.4–1.2)
GFR: 64.18 mL/min (ref >60.00)
Glucose, Bld: 100 mg/dL — ABNORMAL HIGH (ref 70–99)
Sodium: 143 mEq/L (ref 135–145)
Total Bilirubin: 0.4 mg/dL (ref 0.3–1.2)
Total Protein: 7.5 g/dL (ref 6.0–8.3)

## 2011-05-06 LAB — TSH: TSH: 2.55 u[IU]/mL (ref 0.35–5.50)

## 2011-05-06 MED ORDER — ALPRAZOLAM 0.5 MG PO TABS: 0.5000 mg | ORAL_TABLET | Freq: Every evening | ORAL | Status: DC | PRN

## 2011-05-06 MED ORDER — CLOPIDOGREL BISULFATE 75 MG PO TABS: 75.0000 mg | ORAL_TABLET | Freq: Every day | ORAL | Status: DC

## 2011-05-06 NOTE — Assessment & Plan Note (Signed)
Lab today on statin and diet  Rev low sat fat diet   

## 2011-05-06 NOTE — Assessment & Plan Note (Signed)
Lab today Disc otc repl

## 2011-05-06 NOTE — Assessment & Plan Note (Signed)
Suspect multifactorial with dep / anx and stressors playing role Lab today and f/u

## 2011-05-06 NOTE — Assessment & Plan Note (Signed)
Worse lately  On zoloft  Disc stressors and coping tech Asked to consider counseling - pt is not interested yet  Will disc further at f/u

## 2011-05-06 NOTE — Progress Notes (Signed)
Subjective:    Patient ID: [name] [name], female    DOB: 1934/09/18, 77 y.o.   MRN: [id number]  HPI Here with malaise- just really tired, more anxious , does not sleep at all without xanax  Depressed feeling too  Cries for nothing   Getting older- that gets her down  Stress- husband had hip replacement- is in rehab center and doing better  Even though she is separated - stayed friends and she still feels responsible for him  Has a lot of bursitis  Needs to see Dr Channing Mutters for a shot  Chronic pain keeps her down   Very , very tired and unmotivated and somewhat hopeless  Patient Active Problem List  Diagnoses  . HELICOBACTER PYLORI INFECTION  . UNSPECIFIED VITAMIN D DEFICIENCY  . HYPERLIPIDEMIA  . ANXIETY  . DEPRESSION  . MACULAR DEGENERATION  . CVA  . EXTERNAL HEMORRHOIDS  . GERD  . HIATAL HERNIA  . DIVERTICULOSIS OF COLON  . OSTEOARTHRITIS  . ARTHRITIS, GENERALIZED  . DEGENERATIVE DISC DISEASE  . OSTEOPENIA  . FATIGUE  . PALPITATIONS  . CEREBROVASCULAR ACCIDENT, HX OF  . GASTRITIS, HX OF  . OVERACTIVE BLADDER  . Gynecological examination  . Urinary incontinence  . Other dyspnea and respiratory abnormality   Past Medical History  Diagnosis Date  . Painful respiration   . Personal history of unspecified circulatory disease   . Palpitations   . Shortness of breath   . Other and unspecified hyperlipidemia   . Esophageal reflux   . Depressive disorder, not elsewhere classified   . Anxiety state, unspecified   . Unspecified cerebral artery occlusion with cerebral infarction   . Unspecified arthropathy, multiple sites   . Personal history of other diseases of digestive system   . Diaphragmatic hernia without mention of obstruction or gangrene   . External hemorrhoids without mention of complication   . Diverticulosis of colon (without mention of hemorrhage)   . Diarrhea   . Special screening for malignant neoplasms of other sites   . Unspecified vitamin D  deficiency   . Other malaise and fatigue   . Disorder of bone and cartilage, unspecified   . Helicobacter pylori (H. pylori)   . Macular degeneration (senile) of retina, unspecified   . Osteoarthrosis, unspecified whether generalized or localized, unspecified site    Past Surgical History  Procedure Date  . Appendectomy   . Abdominal hysterectomy   . Tubal ligation   . Knee surgery   . Cataract extraction     right   History  Substance Use Topics  . Smoking status: Never Smoker   . Smokeless tobacco: Not on file  . Alcohol Use: Yes     eats raisins soaked in gin everyday   Family History  Problem Relation Age of Onset  . Heart disease Father   . Diabetes Sister   . Coronary artery disease Brother   . Diabetes Brother   . Heart disease Brother   . Cancer Daughter     pancreatic cancer   Allergies  Allergen Reactions  . Alendronate Sodium     REACTION: JAW PAIN  . Celecoxib     REACTION: GI UPSET  . Latex   . Oxycodone-Acetaminophen     REACTION: TOO STRONG  . Sulfonamide Derivatives    Current Outpatient Prescriptions on File Prior to Visit  Medication Sig Dispense Refill  . atorvastatin (LIPITOR) 10 MG tablet Take 1 tablet (10 mg total) by mouth daily.  90 tablet  3  . Cholecalciferol (VITAMIN D) 1000 UNITS capsule Take 1,000 Units by mouth daily.        Marland Kitchen esomeprazole (NEXIUM) 40 MG capsule Take 1 capsule (40 mg total) by mouth 2 (two) times daily before a meal.  180 capsule  3  . HYDROcodone-acetaminophen (NORCO) 10-325 MG per tablet 1-2 tablets by mouth every 8 hours as needed for pain.      . mupirocin (BACTROBAN) 2 % ointment Apply topically 2 (two) times daily.        . raloxifene (EVISTA) 60 MG tablet Take 1 tablet (60 mg total) by mouth daily.  90 tablet  3  . sertraline (ZOLOFT) 100 MG tablet Take 1 tablet (100 mg total) by mouth 2 (two) times daily.  180 tablet  3  . tolterodine (DETROL LA) 4 MG 24 hr capsule Take 1 capsule (4 mg total) by mouth daily.   90 capsule  3        Review of Systems Review of Systems  Constitutional: Negative for fever, appetite change, and unexpected weight change.  Eyes: Negative for pain and visual disturbance.  Respiratory: Negative for cough and shortness of breath.   Cardiovascular: Negative for cp or palpitations    Gastrointestinal: Negative for nausea, diarrhea and constipation.  Genitourinary: Negative for urgency and frequency.  Skin: Negative for pallor or rash   MSK pos for back and hip pain , neg for joint swelling  Neurological: Negative for weakness, light-headedness, numbness and headaches.  Hematological: Negative for adenopathy. Does not bruise/bleed easily.  Psychiatric/Behavioral: pos for depressed and anx mood and insomnia             Objective:   Physical Exam  Constitutional: She appears well-developed and well-nourished. No distress.       overwt and well appearing    HENT:  Head: Normocephalic and atraumatic.  Mouth/Throat: Oropharynx is clear and moist. No oropharyngeal exudate.  Eyes: Conjunctivae and EOM are normal. Pupils are equal, round, and reactive to light.  Neck: Normal range of motion. Neck supple. No JVD present. Carotid bruit is not present. No thyromegaly present.  Cardiovascular: Normal rate, regular rhythm, normal heart sounds and intact distal pulses.  Exam reveals no gallop.   Pulmonary/Chest: Effort normal and breath sounds normal. No respiratory distress. She has no wheezes.  Abdominal: Soft. Bowel sounds are normal. She exhibits no distension and no mass. There is no tenderness.  Musculoskeletal: She exhibits tenderness. She exhibits no edema.       bilat hip tenderness and loss of rom  Lymphadenopathy:    She has no cervical adenopathy.  Neurological: She is alert. She has normal reflexes. No cranial nerve deficit. She exhibits normal muscle tone. Coordination normal.  Skin: Skin is warm and dry. No rash noted. No erythema. No pallor.  Psychiatric:  She has a normal mood and affect.          Assessment & Plan:

## 2011-05-06 NOTE — Assessment & Plan Note (Signed)
Worse lately  Stressors/ age play a role Lab today and disc in detail at f/u Px xanax for pm / sleep

## 2011-05-06 NOTE — Patient Instructions (Signed)
Labs today for fatigue  See Dr Channing Mutters for your hip - so you can stay active Concentrate on caring for yourself Think about counseling for anxiety and depression  Follow up in about 2-3 weeks for follow up (30 min please)

## 2011-05-09 ENCOUNTER — Encounter: Payer: Self-pay | Admitting: *Deleted

## 2011-05-30 ENCOUNTER — Encounter: Payer: Self-pay | Admitting: Family Medicine

## 2011-05-30 ENCOUNTER — Ambulatory Visit (INDEPENDENT_AMBULATORY_CARE_PROVIDER_SITE_OTHER): Payer: Medicare Other | Admitting: Family Medicine

## 2011-05-30 VITALS — BP 110/70 | HR 76 | Temp 98.9°F | Ht 65.5 in | Wt 203.8 lb

## 2011-05-30 DIAGNOSIS — F329 Major depressive disorder, single episode, unspecified: Secondary | ICD-10-CM | POA: Diagnosis not present

## 2011-05-30 DIAGNOSIS — R5383 Other fatigue: Secondary | ICD-10-CM

## 2011-05-30 DIAGNOSIS — R5381 Other malaise: Secondary | ICD-10-CM
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
## 2011-05-31 DIAGNOSIS — M1710 Unilateral primary osteoarthritis, unspecified knee: Secondary | ICD-10-CM | POA: Diagnosis not present

## 2011-06-02 ENCOUNTER — Other Ambulatory Visit: Payer: Self-pay | Admitting: Orthopedic Surgery

## 2011-06-02 NOTE — Progress Notes (Signed)
Preoperative surgical orders have been place into the [facility] hospital system for [name] [name] on 06/02/2011, 5:40 PM  by Patrica Duel for surgery on 08/16/2011.  Preop Total Knee orders including Bupivacaine On-Q pump, IV Tylenol, and IV Decadron as long as there are no contraindications to the above medications.

## 2011-06-10 ENCOUNTER — Other Ambulatory Visit: Payer: Self-pay | Admitting: Neurosurgery

## 2011-06-10 ENCOUNTER — Ambulatory Visit
Admission: RE | Admit: 2011-06-10 | Discharge: 2011-06-10 | Disposition: A | Discharge status: Home / Self Care | Payer: Medicare Other | Source: Ambulatory Visit | Attending: Neurosurgery | Admitting: Neurosurgery

## 2011-06-10 DIAGNOSIS — M545 Low back pain, unspecified: Secondary | ICD-10-CM | POA: Diagnosis not present

## 2011-06-10 DIAGNOSIS — M25559 Pain in unspecified hip: Secondary | ICD-10-CM | POA: Diagnosis not present

## 2011-06-16 ENCOUNTER — Other Ambulatory Visit: Payer: Self-pay | Admitting: Neurosurgery

## 2011-06-16 DIAGNOSIS — M25559 Pain in unspecified hip: Secondary | ICD-10-CM

## 2011-06-16 DIAGNOSIS — M719 Bursopathy, unspecified: Secondary | ICD-10-CM

## 2011-06-20 ENCOUNTER — Ambulatory Visit
Admission: RE | Admit: 2011-06-20 | Discharge: 2011-06-20 | Disposition: A | Discharge status: Home / Self Care | Payer: Medicare Other | Source: Ambulatory Visit | Attending: Neurosurgery | Admitting: Neurosurgery

## 2011-06-20 DIAGNOSIS — M719 Bursopathy, unspecified: Secondary | ICD-10-CM

## 2011-06-20 DIAGNOSIS — M25559 Pain in unspecified hip: Secondary | ICD-10-CM

## 2011-06-20 DIAGNOSIS — M7150 Other bursitis, not elsewhere classified, unspecified site: Secondary | ICD-10-CM | POA: Diagnosis not present

## 2011-06-20 MED ORDER — IOHEXOL 180 MG/ML  SOLN
1.0000 mL | Freq: Once | INTRAMUSCULAR | Status: AC | PRN
  Administered 2011-06-20: 1 mL

## 2011-07-12 ENCOUNTER — Ambulatory Visit (INDEPENDENT_AMBULATORY_CARE_PROVIDER_SITE_OTHER): Payer: Medicare Other | Admitting: Cardiovascular Disease

## 2011-07-12 ENCOUNTER — Encounter: Payer: Self-pay | Admitting: Cardiovascular Disease

## 2011-07-12 VITALS — BP 112/70 | HR 74 | Ht 65.5 in | Wt 201.8 lb

## 2011-07-12 DIAGNOSIS — E785 Hyperlipidemia, unspecified: Secondary | ICD-10-CM

## 2011-07-12 DIAGNOSIS — R002 Palpitations: Secondary | ICD-10-CM

## 2011-07-12 NOTE — Patient Instructions (Addendum)
Your physician recommends that you continue on your current medications as directed. Please refer to the Current Medication list given to you today.  Your physician wants you to follow-up in: 1 YEAR with Dr Cooper.  You will receive a reminder letter in the mail two months in advance. If you don't receive a letter, please call our office to schedule the follow-up appointment.  

## 2011-07-15 ENCOUNTER — Encounter: Payer: Self-pay | Admitting: Cardiovascular Disease

## 2011-07-15 NOTE — Progress Notes (Signed)
HPI:  77 year old woman presenting for followup evaluation. The patient has been followed for chest pain, palpitations, and remote history of TIA. She has been maintained on long-term antiplatelet therapy with Plavix. She's required multiple surgeries in the past, and presents today for preoperative evaluation before a right total knee arthroplasty. Her surgery is scheduled for 08/16/2011. She is physically limited by her severe knee pain.  She denies chest pain or dyspnea. Her palpitations have been quiescent. The patient denies orthopnea, PND, lightheadedness, or syncope.  When I saw her last year, she complained of dyspnea. This is my chronic complaint for her now. An echocardiogram was done and was remarkable only for diastolic dysfunction. There was mild left ventricular hypertrophy. Her left ventricular ejection fraction was within normal limits and there is no significant valvular disease, other than a notation of aortic sclerosis.  Outpatient Encounter Prescriptions as of 07/12/2011  Medication Sig Dispense Refill  . ALPRAZolam (XANAX) 0.5 MG tablet Take 1 tablet (0.5 mg total) by mouth at bedtime as needed for anxiety.  30 tablet  2  . atorvastatin (LIPITOR) 10 MG tablet Take 1 tablet (10 mg total) by mouth daily.  90 tablet  3  . Cholecalciferol (VITAMIN D) 1000 UNITS capsule Take 1,000 Units by mouth daily.        . clopidogrel (PLAVIX) 75 MG tablet Take 1 tablet (75 mg total) by mouth daily.  90 tablet  3  . esomeprazole (NEXIUM) 40 MG capsule Take 1 capsule (40 mg total) by mouth 2 (two) times daily before a meal.  180 capsule  3  . HYDROcodone-acetaminophen (NORCO) 10-325 MG per tablet 1-2 tablets by mouth every 8 hours as needed for pain.      . raloxifene (EVISTA) 60 MG tablet Take 1 tablet (60 mg total) by mouth daily.  90 tablet  3  . sertraline (ZOLOFT) 100 MG tablet Take 1 tablet (100 mg total) by mouth 2 (two) times daily.  180 tablet  3  . sulfamethoxazole-trimethoprim  (BACTRIM DS) 800-160 MG per tablet Take 1 tablet by mouth once.      . tolterodine (DETROL LA) 4 MG 24 hr capsule Take 1 capsule (4 mg total) by mouth daily.  90 capsule  3  . DISCONTD: mupirocin (BACTROBAN) 2 % ointment Apply topically 2 (two) times daily.          Allergies  Allergen Reactions  . Alendronate Sodium     REACTION: JAW PAIN  . Celecoxib     REACTION: GI UPSET  . Latex   . Oxycodone-Acetaminophen     REACTION: TOO STRONG  . Sulfonamide Derivatives     Past Medical History  Diagnosis Date  . Painful respiration   . Personal history of unspecified circulatory disease   . Palpitations   . Shortness of breath   . Other and unspecified hyperlipidemia   . Esophageal reflux   . Depressive disorder, not elsewhere classified   . Anxiety state, unspecified   . Unspecified cerebral artery occlusion with cerebral infarction   . Unspecified arthropathy, multiple sites   . Personal history of other diseases of digestive system   . Diaphragmatic hernia without mention of obstruction or gangrene   . External hemorrhoids without mention of complication   . Diverticulosis of colon (without mention of hemorrhage)   . Diarrhea   . Special screening for malignant neoplasms of other sites   . Unspecified vitamin D deficiency   . Other malaise and fatigue   .  Disorder of bone and cartilage, unspecified   . Helicobacter pylori (H. pylori)   . Macular degeneration (senile) of retina, unspecified   . Osteoarthrosis, unspecified whether generalized or localized, unspecified site     ROS: Negative except as per HPI  BP 112/70  Pulse 74  Ht 5' 5.5" (1.664 m)  Wt 91.536 kg (201 lb 12.8 oz)  BMI 33.07 kg/m2  PHYSICAL EXAM: Pt is alert and oriented, NAD HEENT: normal Neck: JVP - normal, carotids 2+= without bruits Lungs: CTA bilaterally CV: RRR with soft systolic ejection murmur at the right upper sternal border Abd: soft, NT, Positive BS, no hepatomegaly Ext: no C/C/E,  distal pulses intact and equal Skin: warm/dry no rash  EKG:  Normal sinus rhythm 74 beats per minute,  within normal limits  ASSESSMENT AND PLAN: 1. Palpitations. Asymptomatic at present. No further evaluation needed. She has no evidence of structural heart disease and her echocardiogram from last year was reviewed.  2. Preoperative evaluation. The patient had a nuclear stress scan in 2009. She has no interval symptoms of chest pain or pressure. I don't think further workup is indicated at this time as she is at low risk of cardiac complications related to her upcoming knee replacement surgery.  3. Remote TIA. She has been maintained on long-term Plavix. This will need to be held for 5-7 days before her knee surgery. She's had no recent neurologic symptoms.  4. Hyperlipidemia. Recent lipids reviewed demonstrating a total cholesterol of 183, HDL 70, and LDL 87. She is on a statin drug.  Tonny Bollman 07/15/2011 10:36 PM

## 2011-07-28 DIAGNOSIS — IMO0002 Reserved for concepts with insufficient information to code with codable children: Secondary | ICD-10-CM | POA: Diagnosis not present

## 2011-07-28 DIAGNOSIS — M1710 Unilateral primary osteoarthritis, unspecified knee: Secondary | ICD-10-CM | POA: Diagnosis not present

## 2011-08-03 NOTE — H&P (Signed)
[name] [name] DOB: [birth year]  Chief Complaint: right knee pain   History of Present Illness The patient is a 77 year old female who comes in today for a preoperative History and Physical. The patient is scheduled for a right total knee arthroplasty to be performed by Dr. Gus Rankin. Aluisio, MD at [facility] on Tuesday August 16, 2011 . She has been having difficulty bearing weight since the fall. She has known OA in the right knee, and this injury has made it very difficult to ambulate. The knee is getting progressively worse over time. Now that her husband has had one hip fixed, she is ready to contemplate getting her right knee fixed. She has endstage arthritis. She has had injections of cortisone and Visco supplements, none of which have helped. The knee gives out on her. It is limiting what she can and cannot do. She is at a stage now where the knee has essentially taken over her life. Due to failure of conservative measures, the most predictable means for increased function and decreased pain in the right total knee arthroplasty. Risks and benefits discussed.   Problem List/Past Medical Osteoarthrosis NOS, lower leg (715.96).  Anxiety/Depression Transient ischemic attack Macular Degeneration Dentures Varicose veins Gallbladder Problems urinary Urinary Incontinence Chronic fatigue syndrome Degenerative Disc Disease Bursitis. hip Rheumatoid Arthritis  Allergies Latex Exam Gloves  CeleBREX  Alendronate Sodium    Family History Father. deceased age 75 due to MI Mother. deceased age 39 due to sepsis Siblings. heart disease, DM   Social History No alcohol use Tobacco use. Never smoker. Post-Surgical Plans. SNF Advance Directives. healthcare POA   Medication History Plavix (75MG  Tablet, Oral) Active. Zoloft (100MG  Tablet, Oral) Active. Evista (60MG  Tablet, Oral) Active. Lipitor (10MG  Tablet, Oral) Active. ALPRAZolam ER (0.5MG   Tablet ER 24HR, Oral) Active. NexIUM (40MG  Capsule DR, Oral) Active. Norco (10-325MG  Tablet, Oral) Active. Bactrim DS (800-160MG  Tablet, Oral) Active. Probiotic & Acidophilus Ex St ( Oral) Active.   Past Surgical History Cataract Extraction-Bilateral Arthroscopic Knee Surgery - Right Back Surgery. LUMBAR FUSION Hysterectomy    Review of Systems General:Present- Fatigue. Not Present- Chills, Fever, Night Sweats, Weight Gain, Weight Loss and Memory Loss. Skin:Not Present- Hives, Itching, Rash, Eczema and Lesions. HEENT:Present- Dentures. Not Present- Tinnitus, Headache, Double Vision, Visual Loss and Hearing Loss. Respiratory:Present- Shortness of breath with exertion and Allergies. Not Present- Shortness of breath at rest, Coughing up blood and Chronic Cough. Cardiovascular:Not Present- Chest Pain, Racing/skipping heartbeats, Difficulty Breathing Lying Down, Murmur, Swelling and Palpitations. Gastrointestinal:Present- Difficulty Swallowing. Not Present- Bloody Stool, Heartburn, Abdominal Pain, Vomiting, Nausea, Constipation, Diarrhea, Jaundice and Loss of appetitie. Female Genitourinary:Present- Urinary frequency, Incontinence and Urinating at Night. Not Present- Blood in Urine, Weak urinary stream, Discharge, Flank Pain, Painful Urination, Urgency and Urinary Retention. Musculoskeletal:Present- Joint Swelling, Joint Pain, Back Pain, Morning Stiffness and Spasms. Not Present- Muscle Weakness and Muscle Pain. Neurological:Not Present- Tremor, Dizziness, Blackout spells, Paralysis, Difficulty with balance and Weakness. Psychiatric:Present- Insomnia.   Vitals Weight: 201 lb Height: 66 in Body Surface Area: 2.06 m Body Mass Index: 32.44 kg/m Pulse: 88 (Regular) BP: 103/66 (Sitting, Left Arm, Standard)    Physical Exam General Mental Status - Alert, cooperative and good historian. General Appearance- pleasant. Not in acute distress. Orientation-  Oriented X3. Build & Nutrition- Overweight, Well nourished and Well developed. Head and Neck Head- normocephalic, atraumatic . Neck Global Assessment- supple. no bruit auscultated on the right and no bruit auscultated on the left. Eye Pupil- Bilateral- Regular and Round.  Motion- Bilateral- EOMI Chest and Lung Exam Auscultation: Breath sounds:- clear at anterior chest wall and - clear at posterior chest wall. Adventitious sounds:- No Adventitious sounds. Cardiovascular Auscultation:Rhythm- Regular rate and rhythm. Heart Sounds- S1 WNL and S2 WNL. Murmurs & Other Heart Sounds:Auscultation of the heart reveals - No Murmurs. Abdomen Palpation/Percussion:Tenderness- Abdomen is non-tender to palpation. Rigidity (guarding)- Abdomen is soft. Auscultation:Auscultation of the abdomen reveals - Bowel sounds normal. Female Genitourinary Not done, not pertinent to present illness Peripheral Vascular Upper Extremity: Palpation:- Pulses bilaterally normal. Lower Extremity: Palpation:- Pulses bilaterally normal. Neurologic Examination of related systems reveals - normal muscle strength and tone in all extremities. Neurologic evaluation reveals - normal sensation and upper and lower extremity deep tendon reflexes intact bilaterally . Musculoskeletal Her hips have normal range of motion, no discomfort. Her left knee exam nontender but has limited extension to 5 degrees. Her right knee shows no effusion. She has a varus deformity. Her range is 5 to 115. There is marked crepitus on range of motion. She is tender medial greater than lateral. There is no instability noted.    RADIOGRAPHS: AP of both knees and lateral of the right shows severe advanced endstage arthritis of right knee, bone on bone medial and patellofemoral with varus deformity and significant tibial bony erosions with subchondral cystic formation.    Assessment & Plan Osteoarthritis, knee  (715.96) Right total knee arthroplasty    Dimitri Ped, PA-C

## 2011-08-04 ENCOUNTER — Encounter (HOSPITAL_COMMUNITY): Payer: Self-pay | Admitting: Pharmacy Technician

## 2011-08-09 ENCOUNTER — Encounter: Payer: Self-pay | Admitting: Family Medicine

## 2011-08-09 ENCOUNTER — Ambulatory Visit (INDEPENDENT_AMBULATORY_CARE_PROVIDER_SITE_OTHER): Payer: Medicare Other | Admitting: Family Medicine

## 2011-08-09 ENCOUNTER — Telehealth: Payer: Self-pay | Admitting: Family Medicine

## 2011-08-09 VITALS — BP 110/78 | HR 108 | Temp 97.8°F | Wt 202.2 lb

## 2011-08-09 DIAGNOSIS — K648 Other hemorrhoids: Secondary | ICD-10-CM | POA: Diagnosis not present

## 2011-08-09 MED ORDER — HYDROCORTISONE ACETATE 25 MG RE SUPP: 25.0000 mg | Freq: Two times a day (BID) | RECTAL | Status: AC

## 2011-08-09 MED ORDER — HYDROCORTISONE 2.5 % RE CREA: TOPICAL_CREAM | Freq: Two times a day (BID) | RECTAL | Status: AC

## 2011-08-09 NOTE — Patient Instructions (Signed)
You have internal hemorrhoids, likely from recent increase in straining.   Treat with anusol HC suppositories and cream. Also do warm water sitz baths several times a day to help with swelling. If worsening, please return for re evaluation. Start colace daily to try and keep stools soft.  Hemorrhoids Hemorrhoids are enlarged (dilated) veins around the rectum. There are 2 types of hemorrhoids, and the type of hemorrhoid is determined by its location. Internal hemorrhoids occur in the veins just inside the rectum.They are usually not painful, but they may bleed.However, they may poke through to the outside and become irritated and painful. External hemorrhoids involve the veins outside the anus and can be felt as a painful swelling or hard lump near the anus.They are often itchy and may crack and bleed. Sometimes clots will form in the veins. This makes them swollen and painful. These are called thrombosed hemorrhoids. CAUSES Causes of hemorrhoids include:  Pregnancy. This increases the pressure in the hemorrhoidal veins.   Constipation.   Straining to have a bowel movement.   Obesity.   Heavy lifting or other activity that caused you to strain.  TREATMENT Most of the time hemorrhoids improve in 1 to 2 weeks. However, if symptoms do not seem to be getting better or if you have a lot of rectal bleeding, your caregiver may perform a procedure to help make the hemorrhoids get smaller or remove them completely.Possible treatments include:  Rubber band ligation. A rubber band is placed at the base of the hemorrhoid to cut off the circulation.   Sclerotherapy. A chemical is injected to shrink the hemorrhoid.   Infrared light therapy. Tools are used to burn the hemorrhoid.   Hemorrhoidectomy. This is surgical removal of the hemorrhoid.  HOME CARE INSTRUCTIONS   Increase fiber in your diet. Ask your caregiver about using fiber supplements.   Drink enough water and fluids to keep your  urine clear or pale yellow.   Exercise regularly.   Go to the bathroom when you have the urge to have a bowel movement. Do not wait.   Avoid straining to have bowel movements.   Keep the anal area dry and clean.   Only take over-the-counter or prescription medicines for pain, discomfort, or fever as directed by your caregiver.  If your hemorrhoids are thrombosed:  Take warm sitz baths for 20 to 30 minutes, 3 to 4 times per day.   If the hemorrhoids are very tender and swollen, place ice packs on the area as tolerated. Using ice packs between sitz baths may be helpful. Fill a plastic bag with ice. Place a towel between the bag of ice and your skin.   Medicated creams and suppositories may be used or applied as directed.   Do not use a donut-shaped pillow or sit on the toilet for long periods. This increases blood pooling and pain.  SEEK MEDICAL CARE IF:   You have increasing pain and swelling that is not controlled with your medicine.   You have uncontrolled bleeding.   You have difficulty or you are unable to have a bowel movement.   You have pain or inflammation outside the area of the hemorrhoids.   You have chills or an oral temperature above 102 F (38.9 C).  MAKE SURE YOU:   Understand these instructions.   Will watch your condition.   Will get help right away if you are not doing well or get worse.  Document Released: 01/01/2000 Document Revised: 12/23/2010 Document Reviewed: 05/08/2007 [employer]  Patient Information 2012 [employer]. 

## 2011-08-09 NOTE — Telephone Encounter (Signed)
Saw today

## 2011-08-09 NOTE — Progress Notes (Addendum)
  Subjective:    Patient ID: TECORA EUSTACHE, female    DOB: 1934-05-06, 77 y.o.   MRN: [id number]  HPI CC: rectal soreness  H/o IBS (none predominant).  Over last week, change in bowel habits.  First diarrhea, took probiotic (acidophilus) which led to bad constipation.  Constipated for 3-4 days leading to significant straining (friday through Sunday), so took MOM (30cc twice).  For last 2 days having significant diarrhea, accidents.  Having pain with stooling, feels like has to force stool out.  No h/o hemorrhoids.  Normally has 1 soft stool/day.  Normally doesn't spend extra time on commode.    Tried hemorrhoid cream which didn't really help.  Denies blood or mucous in stool, no nausea/vomiting.  Some abd "gurgling" when needs to have stool.  Knee replacement pending next week. last colonoscopy reviewed - about 3 yrs ago (Dr  Juanda Chance) - mild diverticulosis, 2 diminutive polyps  BP Readings from Last 3 Encounters:  08/09/11 110/78  07/12/11 112/70  05/30/11 110/70    Wt Readings from Last 3 Encounters:  08/09/11 202 lb 4 oz (91.74 kg)  07/12/11 201 lb 12.8 oz (91.536 kg)  05/30/11 203 lb 12 oz (92.42 kg)    Past Medical History  Diagnosis Date  . Painful respiration   . Personal history of unspecified circulatory disease   . Palpitations   . Shortness of breath   . Other and unspecified hyperlipidemia   . Esophageal reflux   . Depressive disorder, not elsewhere classified   . Anxiety state, unspecified   . Unspecified cerebral artery occlusion with cerebral infarction   . Unspecified arthropathy, multiple sites   . Personal history of other diseases of digestive system   . Diaphragmatic hernia without mention of obstruction or gangrene   . External hemorrhoids without mention of complication   . Diverticulosis of colon (without mention of hemorrhage)   . Diarrhea   . Special screening for malignant neoplasms of other sites   . Unspecified vitamin D deficiency   .  Other malaise and fatigue   . Disorder of bone and cartilage, unspecified   . Helicobacter pylori (H. pylori)   . Macular degeneration (senile) of retina, unspecified   . Osteoarthrosis, unspecified whether generalized or localized, unspecified site      Review of Systems Per HPI    Objective:   Physical Exam  Nursing note and vitals reviewed. Constitutional: She appears well-developed and well-nourished. No distress.       Slightly tachcyardic, uncomfortable, and remains standing in exam room  Abdominal: Soft. Bowel sounds are normal. She exhibits no distension and no mass. There is no tenderness. There is no rebound and no guarding.  Genitourinary: Rectal exam shows external hemorrhoid, internal hemorrhoid and tenderness. Rectal exam shows no fissure, no mass and anal tone normal. Guaiac positive stool.          Bilateral posterior hemorrhoidal columns inflamed, tender to touch. No thrombosis. No stool in vault       Assessment & Plan:

## 2011-08-09 NOTE — Telephone Encounter (Signed)
Caller: [name] [name]/Patient; PCP: Roxy Manns A.; CB#: [phone]; Calling today 08/09/11 regarding had her last BM on 08/07/11.  She has always had problems with her bowels.  Takes a daily Probiotic. Was constipated.  Pt took Miralax x2.  She did have good results and is cleaned out good.  No more problems with constipation, however now haviing rectal pain.  Thinks mainly irritated.  No rectal bleeding.  Also having leaking of stool now.  She is scheduled for knee replacement next week and wants to get this resolved ASAP.  Emergent symptoms r/o by Rectal Symptoms guidelines with exception of new onset of uncontrolled leaking of BM not related to constipation. Appt scheduled for today 08/09/11 at 4:15 PM with Dr. Sharen Hones.

## 2011-08-10 ENCOUNTER — Encounter: Payer: Self-pay | Admitting: Family Medicine

## 2011-08-10 DIAGNOSIS — K649 Unspecified hemorrhoids: Secondary | ICD-10-CM | POA: Insufficient documentation

## 2011-08-10 NOTE — Assessment & Plan Note (Addendum)
Irritated left lateral and right posterior plexus hemorrhoids. Both internal and external hemorrhoids present, no thrombosis though. Discussed dx and recommended anusol HC - provided with both suppository and cream. Advised warm water soaks as well. To update [place] if not improving as expected.

## 2011-08-11 ENCOUNTER — Encounter (HOSPITAL_COMMUNITY): Payer: Self-pay

## 2011-08-11 ENCOUNTER — Encounter (HOSPITAL_COMMUNITY)
Admission: RE | Admit: 2011-08-11 | Discharge: 2011-08-11 | Disposition: A | Discharge status: Home / Self Care | Payer: Medicare Other | Source: Ambulatory Visit | Attending: Orthopedic Surgery | Admitting: Orthopedic Surgery

## 2011-08-11 DIAGNOSIS — M1990 Unspecified osteoarthritis, unspecified site: Secondary | ICD-10-CM

## 2011-08-11 DIAGNOSIS — R0602 Shortness of breath: Secondary | ICD-10-CM

## 2011-08-11 DIAGNOSIS — I6350 Cerebral infarction due to unspecified occlusion or stenosis of unspecified cerebral artery: Secondary | ICD-10-CM

## 2011-08-11 HISTORY — PX: KNEE SURGERY: SHX244

## 2011-08-11 HISTORY — PX: ABDOMINAL HYSTERECTOMY: SHX81

## 2011-08-11 HISTORY — DX: Unspecified osteoarthritis, unspecified site: M19.90

## 2011-08-11 HISTORY — PX: BACK SURGERY: SHX140

## 2011-08-11 HISTORY — PX: CATARACT EXTRACTION: SUR2

## 2011-08-11 HISTORY — DX: Shortness of breath: R06.02

## 2011-08-11 HISTORY — DX: Cerebral infarction due to unspecified occlusion or stenosis of unspecified cerebral artery: I63.50

## 2011-08-11 HISTORY — DX: Calculus of gallbladder without cholecystitis without obstruction: K80.20

## 2011-08-11 LAB — COMPREHENSIVE METABOLIC PANEL
ALT: 12 U/L (ref 0–35)
AST: 19 U/L (ref 0–37)
Alkaline Phosphatase: 62 U/L (ref 39–117)
CO2: 27 mEq/L (ref 19–32)
GFR calc Af Amer: 64 mL/min — ABNORMAL LOW (ref >90)
GFR calc non Af Amer: 55 mL/min — ABNORMAL LOW (ref >90)
Glucose, Bld: 109 mg/dL — ABNORMAL HIGH (ref 70–99)
Potassium: 4.3 mEq/L (ref 3.5–5.1)
Sodium: 136 mEq/L (ref 135–145)
Total Protein: 7.3 g/dL (ref 6.0–8.3)

## 2011-08-11 LAB — URINALYSIS, ROUTINE W REFLEX MICROSCOPIC
Glucose, UA: NEGATIVE mg/dL
Hgb urine dipstick: NEGATIVE
Leukocytes, UA: NEGATIVE
Protein, ur: NEGATIVE mg/dL
pH: 5.5 (ref 5.0–8.0)

## 2011-08-11 LAB — CBC
Hemoglobin: 13 g/dL (ref 12.0–15.0)
Platelets: 288 10*3/uL (ref 150–400)
RBC: 4.37 MIL/uL (ref 3.87–5.11)

## 2011-08-11 LAB — APTT: aPTT: 31 seconds (ref 24–37)

## 2011-08-11 LAB — SURGICAL PCR SCREEN: Staphylococcus aureus: NEGATIVE

## 2011-08-11 NOTE — Patient Instructions (Addendum)
20 [name] [name] [facility]  08/11/2011   Your procedure is scheduled on: 7-30  -2013  Report to [facility] at      1000  AM..  Call this number if you have problems the morning of surgery: [phone] or [phone] Presurgical Testing prior to   Remember:   Do not eat food:After Midnight.  May have clear liquids:up to 6 Hours before arrival. Nothing after : 0600 AM  Clear liquids include soda, tea, black coffee, apple or grape juice, broth.  Take these medicines the morning of surgery with A SIP OF WATER:  Nexium, Zoloft, Lipitor, Hydrocodone.   Do not wear jewelry, make-up or nail polish.  Do not wear lotions, powders, or perfumes. You may wear deodorant.  Do not shave 48 hours prior to surgery.(face and neck okay, no shaving of legs)  Do not bring valuables to the hospital.  Contacts, dentures or bridgework may not be worn into surgery.  Leave suitcase in the car. After surgery it may be brought to your room.  For patients admitted to the hospital, checkout time is 11:00 AM the day of discharge.   Patients discharged the day of surgery will not be allowed to drive home.  Name and phone number of your driver: ex-spouse [name] [name]  Special Instructions: CHG Shower Use Special Wash: 1/2 bottle night before surgery and 1/2 bottle morning of surgery.(avoid face and genitals)   Please read over the following fact sheets that you were given: MRSA Information, Blood Transfusion fact sheet, Incentive Spirometry Instruction.

## 2011-08-11 NOTE — Pre-Procedure Instructions (Addendum)
08-11-11 EKG(07-12-11) requested copy from Dr. Domenic [facility], not yet with chart or viewable in Epic.W.Aslee Such,RN 08-12-11 EKG of above now viewable and copy with chart. W. Kennon Portela

## 2011-08-16 ENCOUNTER — Encounter (HOSPITAL_COMMUNITY): Payer: Self-pay | Admitting: *Deleted

## 2011-08-16 ENCOUNTER — Encounter (HOSPITAL_COMMUNITY): Payer: Self-pay | Admitting: Anesthesiology

## 2011-08-16 ENCOUNTER — Inpatient Hospital Stay (HOSPITAL_COMMUNITY)
Admission: RE | Admit: 2011-08-16 | Discharge: 2011-08-19 | DRG: 470 | Disposition: A | Discharge status: Skilled Nursing Facility | Payer: Medicare Other | Source: Ambulatory Visit | Attending: Orthopedic Surgery | Admitting: Orthopedic Surgery

## 2011-08-16 ENCOUNTER — Encounter (HOSPITAL_COMMUNITY)
Admission: RE | Disposition: A | Discharge status: Skilled Nursing Facility | Payer: Self-pay | Source: Ambulatory Visit | Attending: Orthopedic Surgery

## 2011-08-16 ENCOUNTER — Ambulatory Visit (HOSPITAL_COMMUNITY): Payer: Medicare Other | Admitting: Anesthesiology

## 2011-08-16 DIAGNOSIS — Z79899 Other long term (current) drug therapy: Secondary | ICD-10-CM

## 2011-08-16 DIAGNOSIS — F3289 Other specified depressive episodes: Secondary | ICD-10-CM | POA: Diagnosis present

## 2011-08-16 DIAGNOSIS — R269 Unspecified abnormalities of gait and mobility: Secondary | ICD-10-CM | POA: Diagnosis not present

## 2011-08-16 DIAGNOSIS — Z01812 Encounter for preprocedural laboratory examination: Secondary | ICD-10-CM

## 2011-08-16 DIAGNOSIS — M069 Rheumatoid arthritis, unspecified: Secondary | ICD-10-CM | POA: Diagnosis present

## 2011-08-16 DIAGNOSIS — F411 Generalized anxiety disorder: Secondary | ICD-10-CM | POA: Diagnosis present

## 2011-08-16 DIAGNOSIS — Z8673 Personal history of transient ischemic attack (TIA), and cerebral infarction without residual deficits: Secondary | ICD-10-CM | POA: Diagnosis not present

## 2011-08-16 DIAGNOSIS — M1710 Unilateral primary osteoarthritis, unspecified knee: Secondary | ICD-10-CM | POA: Diagnosis not present

## 2011-08-16 DIAGNOSIS — G9332 Myalgic encephalomyelitis/chronic fatigue syndrome: Secondary | ICD-10-CM | POA: Diagnosis present

## 2011-08-16 DIAGNOSIS — M6281 Muscle weakness (generalized): Secondary | ICD-10-CM | POA: Diagnosis not present

## 2011-08-16 DIAGNOSIS — E871 Hypo-osmolality and hyponatremia: Secondary | ICD-10-CM | POA: Diagnosis not present

## 2011-08-16 DIAGNOSIS — J9819 Other pulmonary collapse: Secondary | ICD-10-CM | POA: Diagnosis not present

## 2011-08-16 DIAGNOSIS — F329 Major depressive disorder, single episode, unspecified: Secondary | ICD-10-CM | POA: Diagnosis present

## 2011-08-16 DIAGNOSIS — R0989 Other specified symptoms and signs involving the circulatory and respiratory systems: Secondary | ICD-10-CM | POA: Diagnosis not present

## 2011-08-16 DIAGNOSIS — M25569 Pain in unspecified knee: Secondary | ICD-10-CM | POA: Diagnosis not present

## 2011-08-16 DIAGNOSIS — IMO0002 Reserved for concepts with insufficient information to code with codable children: Secondary | ICD-10-CM | POA: Diagnosis present

## 2011-08-16 DIAGNOSIS — Z96659 Presence of unspecified artificial knee joint: Secondary | ICD-10-CM

## 2011-08-16 DIAGNOSIS — R918 Other nonspecific abnormal finding of lung field: Secondary | ICD-10-CM | POA: Diagnosis not present

## 2011-08-16 DIAGNOSIS — H3530 Unspecified macular degeneration: Secondary | ICD-10-CM | POA: Diagnosis present

## 2011-08-16 DIAGNOSIS — D62 Acute posthemorrhagic anemia: Secondary | ICD-10-CM | POA: Diagnosis not present

## 2011-08-16 DIAGNOSIS — R062 Wheezing: Secondary | ICD-10-CM | POA: Diagnosis not present

## 2011-08-16 DIAGNOSIS — M179 Osteoarthritis of knee, unspecified: Secondary | ICD-10-CM

## 2011-08-16 DIAGNOSIS — M1990 Unspecified osteoarthritis, unspecified site: Secondary | ICD-10-CM | POA: Diagnosis not present

## 2011-08-16 DIAGNOSIS — R059 Cough, unspecified: Secondary | ICD-10-CM | POA: Diagnosis not present

## 2011-08-16 DIAGNOSIS — M8448XA Pathological fracture, other site, initial encounter for fracture: Secondary | ICD-10-CM | POA: Diagnosis not present

## 2011-08-16 DIAGNOSIS — R5382 Chronic fatigue, unspecified: Secondary | ICD-10-CM | POA: Diagnosis present

## 2011-08-16 DIAGNOSIS — M899 Disorder of bone, unspecified: Secondary | ICD-10-CM | POA: Diagnosis not present

## 2011-08-16 DIAGNOSIS — Z9289 Personal history of other medical treatment: Secondary | ICD-10-CM

## 2011-08-16 DIAGNOSIS — Z471 Aftercare following joint replacement surgery: Secondary | ICD-10-CM | POA: Diagnosis not present

## 2011-08-16 HISTORY — PX: TOTAL KNEE ARTHROPLASTY: SHX125

## 2011-08-16 SURGERY — ARTHROPLASTY, KNEE, TOTAL
Anesthesia: General | Site: Knee | Laterality: Right | Wound class: Clean

## 2011-08-16 MED ORDER — METOCLOPRAMIDE HCL 10 MG PO TABS: 5.0000 mg | ORAL_TABLET | Freq: Three times a day (TID) | ORAL | Status: DC | PRN

## 2011-08-16 MED ORDER — POLYETHYLENE GLYCOL 3350 17 G PO PACK
17.0000 g | PACK | Freq: Every day | ORAL | Status: DC | PRN
  Administered 2011-08-17 – 2011-08-18 (×2): 17 g via ORAL

## 2011-08-16 MED ORDER — ATORVASTATIN CALCIUM 10 MG PO TABS
10.0000 mg | ORAL_TABLET | Freq: Every morning | ORAL | Status: DC
  Administered 2011-08-17 – 2011-08-19 (×3): 10 mg via ORAL
  Filled 2011-08-16 (×3): qty 1

## 2011-08-16 MED ORDER — LACTATED RINGERS IV SOLN
INTRAVENOUS | Status: DC | PRN
  Administered 2011-08-16 (×2): via INTRAVENOUS

## 2011-08-16 MED ORDER — 0.9 % SODIUM CHLORIDE (POUR BTL) OPTIME
TOPICAL | Status: DC | PRN
  Administered 2011-08-16: 1000 mL

## 2011-08-16 MED ORDER — HYDROCORTISONE 2.5 % RE CREA
TOPICAL_CREAM | Freq: Two times a day (BID) | RECTAL | Status: DC
  Administered 2011-08-16 – 2011-08-17 (×2): via RECTAL
  Administered 2011-08-17: 1 via RECTAL
  Administered 2011-08-18 – 2011-08-19 (×2): via RECTAL
  Filled 2011-08-16: qty 28.35

## 2011-08-16 MED ORDER — METHOCARBAMOL 100 MG/ML IJ SOLN
500.0000 mg | Freq: Four times a day (QID) | INTRAVENOUS | Status: DC | PRN
  Administered 2011-08-16 – 2011-08-17 (×2): 500 mg via INTRAVENOUS
  Filled 2011-08-16 (×2): qty 5

## 2011-08-16 MED ORDER — RIVAROXABAN 10 MG PO TABS
10.0000 mg | ORAL_TABLET | Freq: Every day | ORAL | Status: DC
  Administered 2011-08-17 – 2011-08-19 (×3): 10 mg via ORAL
  Filled 2011-08-16 (×5): qty 1

## 2011-08-16 MED ORDER — CHLORHEXIDINE GLUCONATE 4 % EX LIQD
60.0000 mL | Freq: Once | CUTANEOUS | Status: DC
  Filled 2011-08-16: qty 60

## 2011-08-16 MED ORDER — LIDOCAINE HCL (CARDIAC) 20 MG/ML IV SOLN
INTRAVENOUS | Status: DC | PRN
  Administered 2011-08-16: 30 mg via INTRAVENOUS

## 2011-08-16 MED ORDER — HYDROMORPHONE HCL PF 1 MG/ML IJ SOLN
INTRAMUSCULAR | Status: DC | PRN
  Administered 2011-08-16 (×2): 1 mg via INTRAVENOUS

## 2011-08-16 MED ORDER — EPHEDRINE SULFATE 50 MG/ML IJ SOLN
INTRAMUSCULAR | Status: DC | PRN
  Administered 2011-08-16: 10 mg via INTRAVENOUS

## 2011-08-16 MED ORDER — BISACODYL 10 MG RE SUPP: 10.0000 mg | Freq: Every day | RECTAL | Status: DC | PRN

## 2011-08-16 MED ORDER — ONDANSETRON HCL 4 MG/2ML IJ SOLN
4.0000 mg | Freq: Four times a day (QID) | INTRAMUSCULAR | Status: DC | PRN
  Administered 2011-08-17 (×2): 4 mg via INTRAVENOUS
  Filled 2011-08-16 (×2): qty 2

## 2011-08-16 MED ORDER — MIDAZOLAM HCL 5 MG/ML IJ SOLN
INTRAMUSCULAR | Status: AC
  Filled 2011-08-16: qty 1

## 2011-08-16 MED ORDER — ALPRAZOLAM 0.5 MG PO TABS
0.5000 mg | ORAL_TABLET | Freq: Every evening | ORAL | Status: DC | PRN
  Administered 2011-08-16 – 2011-08-18 (×2): 0.5 mg via ORAL
  Filled 2011-08-16 (×2): qty 1

## 2011-08-16 MED ORDER — FENTANYL CITRATE 0.05 MG/ML IJ SOLN
INTRAMUSCULAR | Status: DC | PRN
  Administered 2011-08-16: 100 ug via INTRAVENOUS
  Administered 2011-08-16: 25 ug via INTRAVENOUS
  Administered 2011-08-16 (×3): 50 ug via INTRAVENOUS
  Administered 2011-08-16: 100 ug via INTRAVENOUS
  Administered 2011-08-16: 25 ug via INTRAVENOUS
  Administered 2011-08-16 (×2): 50 ug via INTRAVENOUS

## 2011-08-16 MED ORDER — SERTRALINE HCL 100 MG PO TABS
100.0000 mg | ORAL_TABLET | Freq: Two times a day (BID) | ORAL | Status: DC
  Administered 2011-08-16 – 2011-08-19 (×6): 100 mg via ORAL
  Filled 2011-08-16 (×7): qty 1

## 2011-08-16 MED ORDER — TRAMADOL HCL 50 MG PO TABS
50.0000 mg | ORAL_TABLET | Freq: Four times a day (QID) | ORAL | Status: DC | PRN
  Administered 2011-08-17 – 2011-08-18 (×2): 100 mg via ORAL
  Filled 2011-08-16 (×2): qty 2

## 2011-08-16 MED ORDER — LABETALOL HCL 5 MG/ML IV SOLN
INTRAVENOUS | Status: DC | PRN
  Administered 2011-08-16: 5 mg via INTRAVENOUS

## 2011-08-16 MED ORDER — MIDAZOLAM HCL 2 MG/2ML IJ SOLN
0.5000 mg | Freq: Once | INTRAMUSCULAR | Status: AC | PRN
  Administered 2011-08-16 (×2): 0.5 mg via INTRAVENOUS

## 2011-08-16 MED ORDER — DIPHENHYDRAMINE HCL 12.5 MG/5ML PO ELIX: 12.5000 mg | ORAL_SOLUTION | ORAL | Status: DC | PRN

## 2011-08-16 MED ORDER — DOCUSATE SODIUM 100 MG PO CAPS
100.0000 mg | ORAL_CAPSULE | Freq: Two times a day (BID) | ORAL | Status: DC
  Administered 2011-08-16 – 2011-08-19 (×6): 100 mg via ORAL

## 2011-08-16 MED ORDER — MENTHOL 3 MG MT LOZG
1.0000 | LOZENGE | OROMUCOSAL | Status: DC | PRN
  Filled 2011-08-16: qty 9

## 2011-08-16 MED ORDER — BUPIVACAINE ON-Q PAIN PUMP (FOR ORDER SET NO CHG)
INJECTION | Status: DC
  Filled 2011-08-16: qty 1

## 2011-08-16 MED ORDER — ACETAMINOPHEN 650 MG RE SUPP: 650.0000 mg | Freq: Four times a day (QID) | RECTAL | Status: DC | PRN

## 2011-08-16 MED ORDER — PHENOL 1.4 % MT LIQD
1.0000 | OROMUCOSAL | Status: DC | PRN
  Filled 2011-08-16: qty 177

## 2011-08-16 MED ORDER — ACETAMINOPHEN 10 MG/ML IV SOLN: 1000.0000 mg | Freq: Once | INTRAVENOUS | Status: DC

## 2011-08-16 MED ORDER — HYDROMORPHONE HCL PF 1 MG/ML IJ SOLN
INTRAMUSCULAR | Status: AC
  Filled 2011-08-16: qty 2

## 2011-08-16 MED ORDER — PROPOFOL 10 MG/ML IV EMUL
INTRAVENOUS | Status: DC | PRN
  Administered 2011-08-16: 150 mg via INTRAVENOUS

## 2011-08-16 MED ORDER — NEOSTIGMINE METHYLSULFATE 1 MG/ML IJ SOLN
INTRAMUSCULAR | Status: DC | PRN
  Administered 2011-08-16: 3 mg via INTRAVENOUS

## 2011-08-16 MED ORDER — SULFAMETHOXAZOLE-TMP DS 800-160 MG PO TABS
1.0000 | ORAL_TABLET | Freq: Every day | ORAL | Status: DC
  Administered 2011-08-16 – 2011-08-18 (×3): 1 via ORAL
  Filled 2011-08-16 (×5): qty 1

## 2011-08-16 MED ORDER — SODIUM CHLORIDE 0.9 % IV SOLN
INTRAVENOUS | Status: DC
  Administered 2011-08-16: 75 mL/h via INTRAVENOUS
  Administered 2011-08-17: 02:00:00 via INTRAVENOUS

## 2011-08-16 MED ORDER — KETAMINE HCL 10 MG/ML IJ SOLN
INTRAMUSCULAR | Status: DC | PRN
  Administered 2011-08-16: 10 mg via INTRAVENOUS

## 2011-08-16 MED ORDER — ACETAMINOPHEN 10 MG/ML IV SOLN
INTRAVENOUS | Status: AC
  Filled 2011-08-16: qty 100

## 2011-08-16 MED ORDER — FLEET ENEMA 7-19 GM/118ML RE ENEM: 1.0000 | ENEMA | Freq: Once | RECTAL | Status: AC | PRN

## 2011-08-16 MED ORDER — HYDROMORPHONE HCL 2 MG PO TABS
2.0000 mg | ORAL_TABLET | ORAL | Status: DC | PRN
  Administered 2011-08-16 (×2): 2 mg via ORAL
  Administered 2011-08-17: 4 mg via ORAL
  Administered 2011-08-17: 2 mg via ORAL
  Administered 2011-08-17: 4 mg via ORAL
  Administered 2011-08-17 – 2011-08-19 (×6): 2 mg via ORAL
  Administered 2011-08-19: 4 mg via ORAL
  Filled 2011-08-16: qty 2
  Filled 2011-08-16 (×3): qty 1
  Filled 2011-08-16 (×2): qty 2
  Filled 2011-08-16 (×6): qty 1

## 2011-08-16 MED ORDER — MEPERIDINE HCL 50 MG/ML IJ SOLN: 6.2500 mg | INTRAMUSCULAR | Status: DC | PRN

## 2011-08-16 MED ORDER — ACETAMINOPHEN 325 MG PO TABS: 650.0000 mg | ORAL_TABLET | Freq: Four times a day (QID) | ORAL | Status: DC | PRN

## 2011-08-16 MED ORDER — BUPIVACAINE 0.25 % ON-Q PUMP SINGLE CATH 100 ML
INJECTION | Status: DC | PRN
  Administered 2011-08-16: 300 mL

## 2011-08-16 MED ORDER — BUPIVACAINE 0.25 % ON-Q PUMP SINGLE CATH 300ML
INJECTION | Status: AC
  Filled 2011-08-16: qty 300

## 2011-08-16 MED ORDER — ACETAMINOPHEN 10 MG/ML IV SOLN
1000.0000 mg | Freq: Four times a day (QID) | INTRAVENOUS | Status: AC
  Administered 2011-08-16 – 2011-08-17 (×4): 1000 mg via INTRAVENOUS
  Filled 2011-08-16 (×6): qty 100

## 2011-08-16 MED ORDER — PROMETHAZINE HCL 25 MG/ML IJ SOLN: 6.2500 mg | INTRAMUSCULAR | Status: DC | PRN

## 2011-08-16 MED ORDER — ACETAMINOPHEN 10 MG/ML IV SOLN
INTRAVENOUS | Status: DC | PRN
  Administered 2011-08-16: 1000 mg via INTRAVENOUS

## 2011-08-16 MED ORDER — LACTATED RINGERS IV SOLN: INTRAVENOUS | Status: DC

## 2011-08-16 MED ORDER — HYDROMORPHONE HCL PF 1 MG/ML IJ SOLN
0.2500 mg | INTRAMUSCULAR | Status: DC | PRN
  Administered 2011-08-16 (×4): 0.5 mg via INTRAVENOUS

## 2011-08-16 MED ORDER — DEXAMETHASONE SODIUM PHOSPHATE 4 MG/ML IJ SOLN
INTRAMUSCULAR | Status: DC | PRN
  Administered 2011-08-16: 10 mg via INTRAVENOUS

## 2011-08-16 MED ORDER — CEFAZOLIN SODIUM-DEXTROSE 2-3 GM-% IV SOLR
2.0000 g | Freq: Four times a day (QID) | INTRAVENOUS | Status: AC
  Administered 2011-08-16 – 2011-08-17 (×2): 2 g via INTRAVENOUS
  Filled 2011-08-16 (×2): qty 50

## 2011-08-16 MED ORDER — DEXAMETHASONE SODIUM PHOSPHATE 10 MG/ML IJ SOLN
10.0000 mg | Freq: Once | INTRAMUSCULAR | Status: DC
  Filled 2011-08-16: qty 1

## 2011-08-16 MED ORDER — SODIUM CHLORIDE 0.9 % IV SOLN: INTRAVENOUS | Status: DC

## 2011-08-16 MED ORDER — ESMOLOL HCL 10 MG/ML IV SOLN
INTRAVENOUS | Status: DC | PRN
  Administered 2011-08-16: 30 mg via INTRAVENOUS

## 2011-08-16 MED ORDER — METHOCARBAMOL 500 MG PO TABS
500.0000 mg | ORAL_TABLET | Freq: Four times a day (QID) | ORAL | Status: DC | PRN
  Administered 2011-08-16 – 2011-08-19 (×6): 500 mg via ORAL
  Filled 2011-08-16 (×6): qty 1

## 2011-08-16 MED ORDER — CEFAZOLIN SODIUM-DEXTROSE 2-3 GM-% IV SOLR
INTRAVENOUS | Status: AC
  Filled 2011-08-16: qty 50

## 2011-08-16 MED ORDER — CEFAZOLIN SODIUM-DEXTROSE 2-3 GM-% IV SOLR
2.0000 g | INTRAVENOUS | Status: AC
  Administered 2011-08-16: 2 g via INTRAVENOUS

## 2011-08-16 MED ORDER — LACTATED RINGERS IV SOLN
INTRAVENOUS | Status: DC
  Administered 2011-08-16: 1000 mL via INTRAVENOUS

## 2011-08-16 MED ORDER — SUCCINYLCHOLINE CHLORIDE 20 MG/ML IJ SOLN
INTRAMUSCULAR | Status: DC | PRN
  Administered 2011-08-16: 100 mg via INTRAVENOUS

## 2011-08-16 MED ORDER — HYDROMORPHONE HCL PF 1 MG/ML IJ SOLN: 0.5000 mg | INTRAMUSCULAR | Status: DC | PRN

## 2011-08-16 MED ORDER — GLYCOPYRROLATE 0.2 MG/ML IJ SOLN
INTRAMUSCULAR | Status: DC | PRN
  Administered 2011-08-16 (×2): 0.4 mg via INTRAVENOUS

## 2011-08-16 MED ORDER — ONDANSETRON HCL 4 MG PO TABS: 4.0000 mg | ORAL_TABLET | Freq: Four times a day (QID) | ORAL | Status: DC | PRN

## 2011-08-16 MED ORDER — METOCLOPRAMIDE HCL 5 MG/ML IJ SOLN
5.0000 mg | Freq: Three times a day (TID) | INTRAMUSCULAR | Status: DC | PRN
  Administered 2011-08-17: 10 mg via INTRAVENOUS
  Filled 2011-08-16: qty 2

## 2011-08-16 MED ORDER — PANTOPRAZOLE SODIUM 40 MG PO TBEC
80.0000 mg | DELAYED_RELEASE_TABLET | Freq: Every day | ORAL | Status: DC
  Administered 2011-08-17 – 2011-08-18 (×2): 80 mg via ORAL
  Filled 2011-08-16 (×2): qty 2

## 2011-08-16 MED ORDER — CISATRACURIUM BESYLATE (PF) 10 MG/5ML IV SOLN
INTRAVENOUS | Status: DC | PRN
  Administered 2011-08-16: 6 mg via INTRAVENOUS

## 2011-08-16 MED ORDER — MIDAZOLAM HCL 5 MG/5ML IJ SOLN
INTRAMUSCULAR | Status: DC | PRN
  Administered 2011-08-16: 1 mg via INTRAVENOUS

## 2011-08-16 MED ORDER — BUPIVACAINE 0.25 % ON-Q PUMP SINGLE CATH 300ML: 300.0000 mL | INJECTION | Status: DC

## 2011-08-16 MED ORDER — POLYETHYLENE GLYCOL 3350 17 G PO PACK
17.0000 g | PACK | Freq: Two times a day (BID) | ORAL | Status: DC
  Administered 2011-08-16 – 2011-08-19 (×6): 17 g via ORAL

## 2011-08-16 SURGICAL SUPPLY — 52 items
BAG ZIPLOCK 12X15 (MISCELLANEOUS) ×2 IMPLANT
BANDAGE ELASTIC 6 VELCRO ST LF (GAUZE/BANDAGES/DRESSINGS) ×2 IMPLANT
BANDAGE ESMARK 6X9 LF (GAUZE/BANDAGES/DRESSINGS) ×1 IMPLANT
BLADE SAG 18X100X1.27 (BLADE) ×2 IMPLANT
BLADE SAW SGTL 11.0X1.19X90.0M (BLADE) ×2 IMPLANT
BNDG ESMARK 6X9 LF (GAUZE/BANDAGES/DRESSINGS) ×2
BOWL SMART MIX CTS (DISPOSABLE) ×2 IMPLANT
CATH KIT ON-Q SILVERSOAK 5IN (CATHETERS) ×2 IMPLANT
CEMENT HV SMART SET (Cement) ×4 IMPLANT
CLOTH BEACON ORANGE TIMEOUT ST (SAFETY) ×2 IMPLANT
CLSR STERI-STRIP ANTIMIC 1/2X4 (GAUZE/BANDAGES/DRESSINGS) ×2 IMPLANT
CUFF TOURN SGL QUICK 34 (TOURNIQUET CUFF) ×1
CUFF TRNQT CYL 34X4X40X1 (TOURNIQUET CUFF) ×1 IMPLANT
DRAPE EXTREMITY T 121X128X90 (DRAPE) ×2 IMPLANT
DRAPE POUCH INSTRU U-SHP 10X18 (DRAPES) ×2 IMPLANT
DRAPE U-SHAPE 47X51 STRL (DRAPES) ×2 IMPLANT
DRSG ADAPTIC 3X8 NADH LF (GAUZE/BANDAGES/DRESSINGS) ×2 IMPLANT
DRSG PAD ABDOMINAL 8X10 ST (GAUZE/BANDAGES/DRESSINGS) ×2 IMPLANT
DURAPREP 26ML APPLICATOR (WOUND CARE) ×2 IMPLANT
ELECT REM PT RETURN 9FT ADLT (ELECTROSURGICAL) ×2
ELECTRODE REM PT RTRN 9FT ADLT (ELECTROSURGICAL) ×1 IMPLANT
EVACUATOR 1/8 PVC DRAIN (DRAIN) ×2 IMPLANT
FACESHIELD LNG OPTICON STERILE (SAFETY) ×10 IMPLANT
GLOVE BIO SURGEON STRL SZ7.5 (GLOVE) IMPLANT
GLOVE BIO SURGEON STRL SZ8 (GLOVE) ×2 IMPLANT
GLOVE BIOGEL PI IND STRL 8 (GLOVE) ×1 IMPLANT
GLOVE BIOGEL PI INDICATOR 8 (GLOVE) ×1
GOWN STRL NON-REIN LRG LVL3 (GOWN DISPOSABLE) ×2 IMPLANT
GOWN STRL REIN XL XLG (GOWN DISPOSABLE) ×2 IMPLANT
HANDPIECE INTERPULSE COAX TIP (DISPOSABLE) ×1
IMMOBILIZER KNEE 20 (SOFTGOODS) ×2
IMMOBILIZER KNEE 20 THIGH 36 (SOFTGOODS) ×1 IMPLANT
KIT BASIN OR (CUSTOM PROCEDURE TRAY) ×2 IMPLANT
MANIFOLD NEPTUNE II (INSTRUMENTS) ×2 IMPLANT
NS IRRIG 1000ML POUR BTL (IV SOLUTION) ×2 IMPLANT
PACK TOTAL JOINT (CUSTOM PROCEDURE TRAY) ×2 IMPLANT
PAD ABD 7.5X8 STRL (GAUZE/BANDAGES/DRESSINGS) IMPLANT
PADDING CAST COTTON 6X4 STRL (CAST SUPPLIES) ×2 IMPLANT
POSITIONER SURGICAL ARM (MISCELLANEOUS) ×2 IMPLANT
SET HNDPC FAN SPRY TIP SCT (DISPOSABLE) ×1 IMPLANT
SPONGE GAUZE 4X4 12PLY (GAUZE/BANDAGES/DRESSINGS) ×2 IMPLANT
STRIP CLOSURE SKIN 1/2X4 (GAUZE/BANDAGES/DRESSINGS) ×4 IMPLANT
SUCTION FRAZIER 12FR DISP (SUCTIONS) ×2 IMPLANT
SUT MNCRL AB 4-0 PS2 18 (SUTURE) ×2 IMPLANT
SUT PDS AB 1 CT1 27 (SUTURE) IMPLANT
SUT VIC AB 2-0 CT1 27 (SUTURE) ×3
SUT VIC AB 2-0 CT1 TAPERPNT 27 (SUTURE) ×3 IMPLANT
SUT VLOC 180 0 24IN GS25 (SUTURE) ×2 IMPLANT
TOWEL OR 17X26 10 PK STRL BLUE (TOWEL DISPOSABLE) ×4 IMPLANT
TRAY FOLEY CATH 14FRSI W/METER (CATHETERS) ×2 IMPLANT
WATER STERILE IRR 1500ML POUR (IV SOLUTION) ×4 IMPLANT
WRAP KNEE MAXI GEL POST OP (GAUZE/BANDAGES/DRESSINGS) ×2 IMPLANT

## 2011-08-16 NOTE — Transfer of Care (Signed)
Immediate Anesthesia Transfer of Care Note  Patient: [name] [name]  Procedure(s) Performed: Procedure(s) (LRB): TOTAL KNEE ARTHROPLASTY (Right)  Patient Location: PACU  Anesthesia Type: General  Level of Consciousness: pateint uncooperative  Airway & Oxygen Therapy: Patient Spontanous Breathing and Patient connected to face mask oxygen  Post-op Assessment: Report given to PACU RN and Post -op Vital signs reviewed and stable  Post vital signs: Reviewed and stable  Complications: No apparent anesthesia complications

## 2011-08-16 NOTE — Anesthesia Preprocedure Evaluation (Signed)
Anesthesia Evaluation  Patient identified by MRN, date of birth, ID band Patient awake    Reviewed: Allergy & Precautions, H&P , NPO status , Patient's Chart, lab work & pertinent test results  Airway Mallampati: II TM Distance: >3 FB Neck ROM: Full    Dental No notable dental hx.    Pulmonary neg pulmonary ROS,  breath sounds clear to auscultation  Pulmonary exam normal       Cardiovascular negative cardio ROS  Rhythm:Regular Rate:Normal     Neuro/Psych PSYCHIATRIC DISORDERS Anxiety Depression TIACVA negative neurological ROS  negative psych ROS   GI/Hepatic negative GI ROS, Neg liver ROS, hiatal hernia,   Endo/Other  negative endocrine ROSMorbid obesity  Renal/GU negative Renal ROS  negative genitourinary   Musculoskeletal negative musculoskeletal ROS (+)   Abdominal   Peds negative pediatric ROS (+)  Hematology negative hematology ROS (+)   Anesthesia Other Findings   Reproductive/Obstetrics negative OB ROS                           Anesthesia Physical Anesthesia Plan  ASA: III  Anesthesia Plan: General   Post-op Pain Management:    Induction: Intravenous, Rapid sequence and Cricoid pressure planned  Airway Management Planned: Oral ETT  Additional Equipment:   Intra-op Plan:   Post-operative Plan: Extubation in OR  Informed Consent: I have reviewed the patients History and Physical, chart, labs and discussed the procedure including the risks, benefits and alternatives for the proposed anesthesia with the patient or authorized representative who has indicated his/her understanding and acceptance.   Dental advisory given  Plan Discussed with: CRNA  Anesthesia Plan Comments:         Anesthesia Quick Evaluation

## 2011-08-16 NOTE — Interval H&P Note (Signed)
History and Physical Interval Note:  08/16/2011 1:17 PM  [name] [name]  has presented today for surgery, with the diagnosis of osteoarthritis right knee   The various methods of treatment have been discussed with the patient and family. After consideration of risks, benefits and other options for treatment, the patient has consented to  Procedure(s) (LRB): TOTAL KNEE ARTHROPLASTY (Right) as a surgical intervention .  The patient's history has been reviewed, patient examined, no change in status, stable for surgery.  I have reviewed the patient's chart and labs.  Questions were answered to the patient's satisfaction.     Loanne Drilling

## 2011-08-16 NOTE — Anesthesia Postprocedure Evaluation (Signed)
  Anesthesia Post-op Note  Patient: [name] [name]  Procedure(s) Performed: Procedure(s) (LRB): TOTAL KNEE ARTHROPLASTY (Right)  Patient Location: PACU  Anesthesia Type: General  Level of Consciousness: awake and alert   Airway and Oxygen Therapy: Patient Spontanous Breathing  Post-op Pain: mild  Post-op Assessment: Post-op Vital signs reviewed, Patient's Cardiovascular Status Stable, Respiratory Function Stable, Patent Airway and No signs of Nausea or vomiting  Post-op Vital Signs: stable  Complications: No apparent anesthesia complications

## 2011-08-16 NOTE — Progress Notes (Signed)
08/16/11 2200 Nursing Pt c/o of needing to urinate despite foley catheter in place. Manipulated foley and reinflated balloon on catheter.Pt still c/o of needing to urinate. No urine noted to foley bag. Flushed foley catheter with 70cc saline. Immediate return of clear yellow urine 300cc to foley bag. Pt reports relief. Will continue to monitor patient and foley output.

## 2011-08-16 NOTE — Op Note (Signed)
Pre-operative diagnosis- Osteoarthritis  Right knee(s)  Post-operative diagnosis- Osteoarthritis Right knee(s)  Procedure-  Right  Total Knee Arthroplasty  Surgeon- Gus Rankin. Marisabel Macpherson, MD  Assistant- Amber constable, PA-C   Anesthesia-  General EBL-* No blood loss amount entered *  Drains Hemovac  Tourniquet time-  Total Tourniquet Time Documented: Thigh (Right) - 43 minutes   Complications- None  Condition-PACU - hemodynamically stable.   Brief Clinical Note  [name] [name] is a 77 y.o. year old female with end stage OA of her right knee with progressively worsening pain and dysfunction. She has constant pain, with activity and at rest and significant functional deficits with difficulties even with ADLs. She has had extensive non-op management including analgesics, injections of cortisone and viscosupplements, and home exercise program, but remains in significant pain with significant dysfunction.Radiographs show bone on bone arthritis all 3 compartments with tibial subluxation. She presents now for right Total Knee Arthroplasty.    Procedure in detail---   The patient is brought into the operating room and positioned supine on the operating table. After successful administration of  General,   a tourniquet is placed high on the  Right thigh(s) and the lower extremity is prepped and draped in the usual sterile fashion. Time out is performed by the operating team and then the  Right lower extremity is wrapped in Esmarch, knee flexed and the tourniquet inflated to 300 mmHg.       A midline incision is made with a ten blade through the subcutaneous tissue to the level of the extensor mechanism. A fresh blade is used to make a medial parapatellar arthrotomy. Soft tissue over the proximal medial tibia is subperiosteally elevated to the joint line with a knife and into the semimembranosus bursa with a Cobb elevator. Soft tissue over the proximal lateral tibia is elevated with attention being  paid to avoiding the patellar tendon on the tibial tubercle. The patella is everted, knee flexed 90 degrees and the ACL and PCL are removed. Findings are bone on bone all 3 compartments with large osteophyte formation and abnormal rotation of the tibia.        The drill is used to create a starting hole in the distal femur and the canal is thoroughly irrigated with sterile saline to remove the fatty contents. The 5 degree Right  valgus alignment guide is placed into the femoral canal and the distal femoral cutting block is pinned to remove 10 mm off the distal femur. Resection is made with an oscillating saw.      The tibia is subluxed forward and the menisci are removed. The extramedullary alignment guide is placed referencing proximally at the medial aspect of the tibial tubercle and distally along the second metatarsal axis and tibial crest. The block is pinned to remove 2mm off the more deficient medial  side. Resection is made with an oscillating saw. Size 3is the most appropriate size for the tibia and the proximal tibia is prepared with the modular drill and keel punch for that size.      The femoral sizing guide is placed and size 4 narrow is most appropriate. Rotation is marked off the epicondylar axis and confirmed by creating a rectangular flexion gap at 90 degrees. The size 4 cutting block is pinned in this rotation and the anterior, posterior and chamfer cuts are made with the oscillating saw. The intercondylar block is then placed and that cut is made.      Trial size 3 tibial component, trial  size 4 narrow posterior stabilized femur and a 12.5  mm posterior stabilized rotating platform insert trial is placed. Full extension is achieved with excellent varus/valgus and anterior/posterior balance throughout full range of motion. The patella is everted and thickness measured to be 25  mm. Free hand resection is taken to 14 mm, a 38 template is placed, lug holes are drilled, trial patella is placed,  and it tracks normally. Osteophytes are removed off the posterior femur with the trial in place. All trials are removed and the cut bone surfaces prepared with pulsatile lavage. Cement is mixed and once ready for implantation, the size 3 tibial implant, size  4 narrow posterior stabilized femoral component, and the size 38 patella are cemented in place and the patella is held with the clamp. The trial insert is placed and the knee held in full extension. All extruded cement is removed and once the cement is hard the permanent 12.5 mm posterior stabilized rotating platform insert is placed into the tibial tray.      The wound is copiously irrigated with saline solution and the extensor mechanism closed over a hemovac drain with #1 PDS suture. The tourniquet is released for a total tourniquet time of 43  minutes. Flexion against gravity is 140 degrees and the patella tracks normally. Subcutaneous tissue is closed with 2.0 vicryl and subcuticular with running 4.0 Monocryl. The catheter for the Marcaine pain pump is placed and the pump is initiated. The incision is cleaned and dried and steri-strips and a bulky sterile dressing are applied. The limb is placed into a knee immobilizer and the patient is awakened and transported to recovery in stable condition.      Please note that a surgical assistant was a medical necessity for this procedure in order to perform it in a safe and expeditious manner. Surgical assistant was necessary to retract the ligaments and vital neurovascular structures to prevent injury to them and also necessary for proper positioning of the limb to allow for anatomic placement of the prosthesis.   Gus Rankin [name] Quin, MD    08/16/2011, 2:41 PM

## 2011-08-17 ENCOUNTER — Inpatient Hospital Stay (HOSPITAL_COMMUNITY): Payer: Medicare Other

## 2011-08-17 ENCOUNTER — Encounter (HOSPITAL_COMMUNITY): Payer: Self-pay | Admitting: Orthopedic Surgery

## 2011-08-17 DIAGNOSIS — R05 Cough: Secondary | ICD-10-CM | POA: Diagnosis not present

## 2011-08-17 DIAGNOSIS — R918 Other nonspecific abnormal finding of lung field: Secondary | ICD-10-CM | POA: Diagnosis not present

## 2011-08-17 DIAGNOSIS — R062 Wheezing: Secondary | ICD-10-CM | POA: Diagnosis not present

## 2011-08-17 LAB — CBC
HCT: 28.5 % — ABNORMAL LOW (ref 36.0–46.0)
Hemoglobin: 9.5 g/dL — ABNORMAL LOW (ref 12.0–15.0)
MCHC: 33.3 g/dL (ref 30.0–36.0)
MCV: 90.2 fL (ref 78.0–100.0)
RDW: 13.4 % (ref 11.5–15.5)

## 2011-08-17 LAB — BASIC METABOLIC PANEL
BUN: 16 mg/dL (ref 6–23)
Chloride: 102 mEq/L (ref 96–112)
Creatinine, Ser: 0.83 mg/dL (ref 0.50–1.10)
GFR calc non Af Amer: 68 mL/min — ABNORMAL LOW (ref >90)
Glucose, Bld: 157 mg/dL — ABNORMAL HIGH (ref 70–99)
Potassium: 4.1 mEq/L (ref 3.5–5.1)

## 2011-08-17 MED ORDER — ALBUTEROL SULFATE (5 MG/ML) 0.5% IN NEBU
2.5000 mg | INHALATION_SOLUTION | RESPIRATORY_TRACT | Status: DC | PRN
  Administered 2011-08-17 – 2011-08-18 (×2): 2.5 mg via RESPIRATORY_TRACT
  Filled 2011-08-17 (×2): qty 0.5

## 2011-08-17 NOTE — Progress Notes (Signed)
Pt was due to void by 2000. Pt attempted to used [facility] and was unable to void. Did bladder scanner and only found 267 mls. Will continue to monitor q1h.

## 2011-08-17 NOTE — Progress Notes (Signed)
Pt's O2 sats dropped to 32. Put pt on 3L O2 and continuous pulse ox, brought sats up to between 84-89. Paged on call PA. Rexene Edison called back and ordered chest xray, albuterol prn and a respiratory consult. Will continue to monitor.

## 2011-08-17 NOTE — Progress Notes (Signed)
08/17/11 0430 Nursing Pt c/o about urge to urinate again. Approximately 300xx in bag at this time. Patient bladder scanned at this time-no urine in bladder. Will continue to monitor paient.

## 2011-08-17 NOTE — Progress Notes (Signed)
Physical Therapy Treatment Patient Details Name: [name] [name] MRN: [id number] DOB: 07/06/1934 Today's Date: 08/17/2011 Time: 4132-4401 PT Time Calculation (min): 18 min  PT Assessment / Plan / Recommendation Comments on Treatment Session       Follow Up Recommendations  Skilled nursing facility    Barriers to Discharge        Equipment Recommendations  Defer to next venue    Recommendations for Other Services OT consult  Frequency 7X/week   Plan Discharge plan remains appropriate    Precautions / Restrictions Precautions Precautions: Knee Required Braces or Orthoses: Knee Immobilizer - Right Knee Immobilizer - Right: Discontinue once straight leg raise with < 10 degree lag Restrictions Weight Bearing Restrictions: No Other Position/Activity Restrictions: WBAT   Pertinent Vitals/Pain     Mobility  Bed Mobility Bed Mobility: Sit to Supine Sit to Supine: 3: Mod assist Details for Bed Mobility Assistance: cues for sequence and use of L LE to self assist Transfers Transfers: Sit to Stand;Stand to Sit Sit to Stand: 3: Mod assist Stand to Sit: 3: Mod assist Details for Transfer Assistance: cues for use of UEs and for LE management Ambulation/Gait Ambulation/Gait Assistance: 1: +2 Total assist Ambulation/Gait: Patient Percentage: 70% Ambulation Distance (Feet): 19 Feet Assistive device: Rolling walker Ambulation/Gait Assistance Details: cues for posture, sequence and position from RW; ltd by c/o nause Gait Pattern: Step-to pattern    Exercises     PT Diagnosis:    PT Problem List:   PT Treatment Interventions:     PT Goals Acute Rehab PT Goals PT Goal Formulation: With patient Time For Goal Achievement: 08/23/11 Potential to Achieve Goals: Good Pt will go Supine/Side to Sit: with supervision PT Goal: Supine/Side to Sit - Progress: Goal set today Pt will go Sit to Supine/Side: with supervision PT Goal: Sit to Supine/Side - Progress: Goal set today Pt will  go Sit to Stand: with supervision PT Goal: Sit to Stand - Progress: Goal set today Pt will go Stand to Sit: with supervision PT Goal: Stand to Sit - Progress: Goal set today Pt will Ambulate: 51 - 150 feet;with supervision;with rolling walker PT Goal: Ambulate - Progress: Goal set today  Visit Information  Last PT Received On: 08/17/11 Assistance Needed: +2    Subjective Data  Subjective: I'm doing a little better but I still might get nauseous Patient Stated Goal: rehab and then home with husband   Cognition  Overall Cognitive Status: Appears within functional limits for tasks assessed/performed Arousal/Alertness: Awake/alert Orientation Level: Appears intact for tasks assessed Behavior During Session: [facility] for tasks performed    Balance     End of Session PT - End of Session Equipment Utilized During Treatment: Right knee immobilizer Activity Tolerance: Other (comment) (c/o nausea) Patient left: in bed;with call bell/phone within reach Nurse Communication: Mobility status   GP     Sabiha Sura 08/17/2011, 3:17 PM

## 2011-08-17 NOTE — Progress Notes (Signed)
Utilization review completed.  

## 2011-08-17 NOTE — Evaluation (Signed)
Physical Therapy Evaluation Patient Details Name: [name] [name] MRN: [id number] DOB: 06-29-1934 Today's Date: 08/17/2011 Time: 4098-1191 PT Time Calculation (min): 38 min  PT Assessment / Plan / Recommendation Clinical Impression  Pt with R TKR presents with decreased R LE strength/ROM and limitations in functional mobility    PT Assessment  Patient needs continued PT services    Follow Up Recommendations  Skilled nursing facility    Barriers to Discharge        Equipment Recommendations  Defer to next venue    Recommendations for Other Services OT consult   Frequency 7X/week    Precautions / Restrictions Precautions Precautions: Knee Required Braces or Orthoses: Knee Immobilizer - Right Knee Immobilizer - Right: Discontinue once straight leg raise with < 10 degree lag Restrictions Weight Bearing Restrictions: No Other Position/Activity Restrictions: WBAT   Pertinent Vitals/Pain 6/10; premedicated, cold packs provided      Mobility  Bed Mobility Bed Mobility: Supine to Sit Supine to Sit: 3: Mod assist Details for Bed Mobility Assistance: cues for sequence and use of L LE to self assist Transfers Transfers: Sit to Stand;Stand to Sit Sit to Stand: 3: Mod assist Stand to Sit: 3: Mod assist Details for Transfer Assistance: cues for use of UEs and for LE management Ambulation/Gait Ambulation/Gait Assistance: 1: +2 Total assist Ambulation/Gait: Patient Percentage: 70% Ambulation Distance (Feet): 5 Feet Assistive device: Rolling walker Ambulation/Gait Assistance Details: cues for sequence, posture, position from RW; ltd by c/o nausea Gait Pattern: Step-to pattern    Exercises Total Joint Exercises Ankle Circles/Pumps: AROM;Both;10 reps;Supine Quad Sets: AROM;Both;10 reps;Supine Heel Slides: AAROM;10 reps;Supine;Right Straight Leg Raises: AAROM;Right;10 reps;Supine   PT Diagnosis: Difficulty walking  PT Problem List: Decreased strength;Decreased range of  motion;Decreased activity tolerance;Decreased mobility;Decreased knowledge of use of DME;Pain PT Treatment Interventions: DME instruction;Gait training;Stair training;Functional mobility training;Therapeutic activities;Therapeutic exercise;Patient/family education   PT Goals Acute Rehab PT Goals PT Goal Formulation: With patient Time For Goal Achievement: 08/23/11 Potential to Achieve Goals: Good Pt will go Supine/Side to Sit: with supervision PT Goal: Supine/Side to Sit - Progress: Goal set today Pt will go Sit to Supine/Side: with supervision PT Goal: Sit to Supine/Side - Progress: Goal set today Pt will go Sit to Stand: with supervision PT Goal: Sit to Stand - Progress: Goal set today Pt will go Stand to Sit: with supervision PT Goal: Stand to Sit - Progress: Goal set today Pt will Ambulate: 51 - 150 feet;with supervision;with rolling walker PT Goal: Ambulate - Progress: Goal set today  Visit Information  Last PT Received On: 08/17/11 Assistance Needed: +2    Subjective Data  Subjective: I had a really bad night and my back is really hurting Patient Stated Goal: rehab and then home with husband   Prior Functioning  Home Living Lives With: Spouse Prior Function Level of Independence: Independent with assistive device(s) Able to Take Stairs?: Yes Communication Communication: No difficulties    Cognition  Overall Cognitive Status: Appears within functional limits for tasks assessed/performed Arousal/Alertness: Awake/alert Orientation Level: Appears intact for tasks assessed Behavior During Session: [facility] for tasks performed    Extremity/Trunk Assessment Right Upper Extremity Assessment RUE ROM/Strength/Tone: [facility] for tasks assessed Left Upper Extremity Assessment LUE ROM/Strength/Tone: [facility] for tasks assessed Right Lower Extremity Assessment RLE ROM/Strength/Tone: Deficits RLE ROM/Strength/Tone Deficits: -10 - 50 AAROM with 2=/5 quads Left Lower Extremity Assessment LLE  ROM/Strength/Tone: WFL for tasks assessed   Balance    End of Session PT - End of Session Equipment Utilized During  Treatment: Right knee immobilizer Activity Tolerance: Other (comment) (ltd by nausea) Patient left: in chair;with call bell/phone within reach;with nursing in room Nurse Communication: Mobility status CPM Right Knee CPM Right Knee: Off  GP     [name] [name] 08/17/2011, 12:15 PM

## 2011-08-18 LAB — BASIC METABOLIC PANEL
BUN: 17 mg/dL (ref 6–23)
Calcium: 8.3 mg/dL — ABNORMAL LOW (ref 8.4–10.5)
GFR calc Af Amer: 65 mL/min — ABNORMAL LOW (ref >90)
GFR calc non Af Amer: 56 mL/min — ABNORMAL LOW (ref >90)
Glucose, Bld: 137 mg/dL — ABNORMAL HIGH (ref 70–99)

## 2011-08-18 LAB — CBC
HCT: 25.8 % — ABNORMAL LOW (ref 36.0–46.0)
Hemoglobin: 8.3 g/dL — ABNORMAL LOW (ref 12.0–15.0)
MCH: 29.3 pg (ref 26.0–34.0)
MCHC: 32.2 g/dL (ref 30.0–36.0)
RDW: 13.4 % (ref 11.5–15.5)

## 2011-08-18 MED ORDER — ACETAMINOPHEN 10 MG/ML IV SOLN
1000.0000 mg | Freq: Once | INTRAVENOUS | Status: AC
  Administered 2011-08-18: 1000 mg via INTRAVENOUS
  Filled 2011-08-18: qty 100

## 2011-08-18 MED ORDER — FUROSEMIDE 10 MG/ML IJ SOLN
10.0000 mg | Freq: Once | INTRAMUSCULAR | Status: AC
  Administered 2011-08-18: 10 mg via INTRAVENOUS
  Filled 2011-08-18: qty 1

## 2011-08-18 MED ORDER — NON FORMULARY: 40.0000 mg | Freq: Two times a day (BID) | Status: DC

## 2011-08-18 MED ORDER — ESOMEPRAZOLE MAGNESIUM 40 MG PO CPDR
40.0000 mg | DELAYED_RELEASE_CAPSULE | Freq: Two times a day (BID) | ORAL | Status: DC
  Administered 2011-08-18 – 2011-08-19 (×3): 40 mg via ORAL
  Filled 2011-08-18 (×7): qty 1

## 2011-08-18 MED ORDER — ALPRAZOLAM 0.25 MG PO TABS
0.2500 mg | ORAL_TABLET | Freq: Three times a day (TID) | ORAL | Status: DC | PRN
  Administered 2011-08-18 – 2011-08-19 (×2): 0.25 mg via ORAL
  Filled 2011-08-18 (×2): qty 1

## 2011-08-18 NOTE — Progress Notes (Signed)
OT Cancellation Note  Treatment cancelled today due to medical issues with patient which prohibited therapy. Pt still receiving 1st unit of blood. Will check back as schedule permits.  Ceira Hoeschen A OTR/L [phone] 08/18/2011, 1:31 PM

## 2011-08-18 NOTE — Progress Notes (Signed)
   Subjective: 2 Days Post-Op Procedure(s) (LRB): TOTAL KNEE ARTHROPLASTY (Right) Patient reports pain as mild.   Patient seen in rounds with Dr. Lequita Halt. Patient is having problems with fatigue and indigestion HGB is down to 8.3.  Will give blood today. Plan is to go Home after hospital stay.  Objective: Vital signs in last 24 hours: Temp:  [97.3 F (36.3 C)-98.6 F (37 C)] 98.4 F (36.9 C) (08/01 0520) Pulse Rate:  [72-120] 100  (08/01 0520) Resp:  [16-20] 20  (08/01 0520) BP: (91-115)/(55-68) 98/62 mmHg (08/01 0520) SpO2:  [77 %-96 %] 94 % (08/01 0900)  Intake/Output from previous day:  Intake/Output Summary (Last 24 hours) at 08/18/11 0918 Last data filed at 08/18/11 0734  Gross per 24 hour  Intake    840 ml  Output    575 ml  Net    265 ml    Intake/Output this shift: Total I/O In: -  Out: 75 [Urine:75]  Labs:  [facility] 08/18/11 0330 08/17/11 0438  HGB 8.3* 9.5*    Basename 08/18/11 0330 08/17/11 0438  WBC 9.8 12.4*  RBC 2.83* 3.16*  HCT 25.8* 28.5*  PLT 232 247    Basename 08/18/11 0330 08/17/11 0438  NA 131* 135  K 4.0 4.1  CL 99 102  CO2 24 22  BUN 17 16  CREATININE 0.97 0.83  GLUCOSE 137* 157*  CALCIUM 8.3* 8.3*   No results found for this basename: LABPT:2,INR:2 in the last 72 hours  EXAM General - Patient is Alert, Appropriate and Oriented Extremity - Neurovascular intact Sensation intact distally Dorsiflexion/Plantar flexion intact Dressing/Incision - clean, dry, no drainage, healing Motor Function - intact, moving foot and toes well on exam.   Past Medical History  Diagnosis Date  . Painful respiration   . Personal history of unspecified circulatory disease   . Palpitations   . Other and unspecified hyperlipidemia   . Esophageal reflux   . Depressive disorder, not elsewhere classified   . Anxiety state, unspecified   . Unspecified arthropathy, multiple sites   . Diaphragmatic hernia without mention of obstruction or gangrene     . External hemorrhoids without mention of complication   . Diverticulosis of colon (without mention of hemorrhage)   . Diarrhea   . Special screening for malignant neoplasms of other sites   . Unspecified vitamin D deficiency   . Other malaise and fatigue 08-11-11    hx. "chronic fatigue syndrome"  . Disorder of bone and cartilage, unspecified   . Helicobacter pylori (H. pylori)   . Macular degeneration (senile) of retina, unspecified   . Shortness of breath 08-11-11    with exertion only at present  . Unspecified cerebral artery occlusion with cerebral infarction 08-11-11    '97/ '06( TIA)-affected lt. side, no residual  . Gallstones 08-11-11    asymptomatic at present  . Personal history of other diseases of digestive system 08-11-11    "Irittable bowel syndrome"  . Urinary incontinence 08-11-11    wears peripad daily  . Osteoarthrosis, unspecified whether generalized or localized, unspecified site 08-11-11    hx. "rhematoid arthritis", osteoarthritis, DDD, bursitis(hip)    Assessment/Plan: 2 Days Post-Op Procedure(s) (LRB): TOTAL KNEE ARTHROPLASTY (Right) Principal Problem:  *OA (osteoarthritis) of knee   Up with therapy Discharge to SNF tomorrow Blood today, Recheck labs tomorrow.  DVT Prophylaxis - Xarelto Weight-Bearing as tolerated to right leg  Crystalle Popwell 08/18/2011, 9:18 AM

## 2011-08-18 NOTE — Progress Notes (Signed)
Physical Therapy Treatment Patient Details Name: [name] [name] MRN: [id number] DOB: Jul 21, 1934 Today's Date: 08/18/2011 Time: 6578-4696 PT Time Calculation (min): 28 min  PT Assessment / Plan / Recommendation Comments on Treatment Session  Marked improvement in activity tolerance vs last session    Follow Up Recommendations  Skilled nursing facility    Barriers to Discharge        Equipment Recommendations  Defer to next venue    Recommendations for Other Services OT consult  Frequency 7X/week   Plan Discharge plan remains appropriate    Precautions / Restrictions Precautions Precautions: Knee Required Braces or Orthoses: Knee Immobilizer - Right Knee Immobilizer - Right: Discontinue once straight leg raise with < 10 degree lag Restrictions Weight Bearing Restrictions: No Other Position/Activity Restrictions: WBAT   Pertinent Vitals/Pain 4/10; pt premedicated    Mobility  Bed Mobility Bed Mobility: Supine to Sit Sit to Supine: 4: Min assist;3: Mod assist Details for Bed Mobility Assistance: cues for sequence and use of L LE to self assist Transfers Transfers: Sit to Stand;Stand to Sit Sit to Stand: 4: Min assist;3: Mod assist Stand to Sit: 4: Min assist;3: Mod assist Details for Transfer Assistance: cues for use of UEs and for LE management Ambulation/Gait Ambulation/Gait Assistance: 4: Min assist;3: Mod assist Ambulation Distance (Feet): 49 Feet (49 + 19) Assistive device: Rolling walker Ambulation/Gait Assistance Details: cues for posture, sequence, and position from RW Gait Pattern: Step-to pattern Gait velocity: slow    Exercises     PT Diagnosis:    PT Problem List:   PT Treatment Interventions:     PT Goals Acute Rehab PT Goals PT Goal Formulation: With patient Time For Goal Achievement: 08/23/11 Potential to Achieve Goals: Good Pt will go Supine/Side to Sit: with supervision PT Goal: Supine/Side to Sit - Progress: Progressing toward goal Pt  will go Sit to Supine/Side: with supervision PT Goal: Sit to Supine/Side - Progress: Progressing toward goal Pt will go Sit to Stand: with supervision PT Goal: Sit to Stand - Progress: Progressing toward goal Pt will go Stand to Sit: with supervision PT Goal: Stand to Sit - Progress: Progressing toward goal Pt will Ambulate: 51 - 150 feet;with supervision;with rolling walker PT Goal: Ambulate - Progress: Progressing toward goal  Visit Information  Last PT Received On: 08/18/11 Assistance Needed: +1    Subjective Data  Subjective: I feel much better than this morning Patient Stated Goal: rehab and then home with husband   Cognition  Overall Cognitive Status: Appears within functional limits for tasks assessed/performed Arousal/Alertness: Awake/alert Orientation Level: Appears intact for tasks assessed Behavior During Session: [facility] for tasks performed    Balance     End of Session PT - End of Session Equipment Utilized During Treatment: Right knee immobilizer Activity Tolerance: Patient tolerated treatment well Patient left: in bed;with call bell/phone within reach Nurse Communication: Mobility status CPM Right Knee CPM Right Knee: On   GP     Lilymae Swiech 08/18/2011, 4:44 PM

## 2011-08-18 NOTE — Progress Notes (Signed)
Pt still hadn't voided as of 2345. Did a bladder scanner and showed 368 mls of urine in bladder. In and out cathed pt and got out 500 mls of amber urine. Will continue to monitor pt.

## 2011-08-18 NOTE — Care Management Note (Signed)
    Page 1 of 2   08/18/2011     2:23:07 PM   CARE MANAGEMENT NOTE 08/18/2011  Patient:  [name], [name]   Account Number:  [id number]  Date Initiated:  08/18/2011  Documentation initiated by:  Colleen Can  Subjective/Objective Assessment:   dx osteoarthritis right knee; total knee replacemnt     Action/Plan:   SNF rehab   Anticipated DC Date:  08/19/2011   Anticipated DC Plan:  SKILLED NURSING FACILITY  In-house referral  Clinical Social Worker      DC Planning Services  CM consult      [facility] Choice  NA   Choice offered to / List presented to:  NA   DME arranged  NA      DME agency  NA     HH arranged  NA      HH agency  NA   Status of service:  Completed, signed off Medicare Important Message given?  NA - LOS <3 / Initial given by admissions (If response is "NO", the following Medicare IM given date fields will be blank) Date Medicare IM given:   Date Additional Medicare IM given:    Discharge Disposition:    Per UR Regulation:    If discussed at Long Length of Stay Meetings, dates discussed:    Comments:

## 2011-08-18 NOTE — Progress Notes (Signed)
LATE ENTRY NOTE Date of Service of Visit - 08/17/2011  Subjective: 1 Days Post-Op Procedure(s) (LRB): TOTAL KNEE ARTHROPLASTY (Right) Patient reports pain as mild and moderate.   Patient seen in rounds for Dr. Lequita Halt. Patient is well, and has had no acute complaints or problems We will start therapy today.  Plan is to go Skilled nursing facility after hospital stay.  Objective: Vital signs in last 24 hours: Temp:  [97.3 F (36.3 C)-98.6 F (37 C)] 98.4 F (36.9 C) (08/01 0520) Pulse Rate:  [72-120] 100  (08/01 0520) Resp:  [16-20] 20  (08/01 0520) BP: (91-115)/(55-68) 98/62 mmHg (08/01 0520) SpO2:  [77 %-96 %] 94 % (08/01 0900)  Intake/Output from previous day:  Intake/Output Summary (Last 24 hours) at 08/18/11 0915 Last data filed at 08/18/11 0734  Gross per 24 hour  Intake    840 ml  Output    575 ml  Net    265 ml    Intake/Output this shift: Total I/O In: -  Out: 75 [Urine:75]  Labs:  [facility] 08/18/11 0330 08/17/11 0438  HGB 8.3* 9.5*    Basename 08/18/11 0330 08/17/11 0438  WBC 9.8 12.4*  RBC 2.83* 3.16*  HCT 25.8* 28.5*  PLT 232 247    Basename 08/18/11 0330 08/17/11 0438  NA 131* 135  K 4.0 4.1  CL 99 102  CO2 24 22  BUN 17 16  CREATININE 0.97 0.83  GLUCOSE 137* 157*  CALCIUM 8.3* 8.3*   No results found for this basename: LABPT:2,INR:2 in the last 72 hours  EXAM General - Patient is Alert, Appropriate and Oriented Extremity - Neurovascular intact Sensation intact distally Dorsiflexion/Plantar flexion intact Dressing - dressing C/D/I Motor Function - intact, moving foot and toes well on exam.  Hemovac pulled without difficulty.  Past Medical History  Diagnosis Date  . Painful respiration   . Personal history of unspecified circulatory disease   . Palpitations   . Other and unspecified hyperlipidemia   . Esophageal reflux   . Depressive disorder, not elsewhere classified   . Anxiety state, unspecified   . Unspecified  arthropathy, multiple sites   . Diaphragmatic hernia without mention of obstruction or gangrene   . External hemorrhoids without mention of complication   . Diverticulosis of colon (without mention of hemorrhage)   . Diarrhea   . Special screening for malignant neoplasms of other sites   . Unspecified vitamin D deficiency   . Other malaise and fatigue 08-11-11    hx. "chronic fatigue syndrome"  . Disorder of bone and cartilage, unspecified   . Helicobacter pylori (H. pylori)   . Macular degeneration (senile) of retina, unspecified   . Shortness of breath 08-11-11    with exertion only at present  . Unspecified cerebral artery occlusion with cerebral infarction 08-11-11    '97/ '06( TIA)-affected lt. side, no residual  . Gallstones 08-11-11    asymptomatic at present  . Personal history of other diseases of digestive system 08-11-11    "Irittable bowel syndrome"  . Urinary incontinence 08-11-11    wears peripad daily  . Osteoarthrosis, unspecified whether generalized or localized, unspecified site 08-11-11    hx. "rhematoid arthritis", osteoarthritis, DDD, bursitis(hip)    Assessment/Plan: 1 Days Post-Op Procedure(s) (LRB): TOTAL KNEE ARTHROPLASTY (Right) Principal Problem:  *OA (osteoarthritis) of knee   Advance diet Up with therapy Discharge to SNF  DVT Prophylaxis - Xarelto Weight-Bearing as tolerated to right leg No vaccines. D/C O2 and Pulse OX and try on  Room [address]  Patrica Duel 08/18/2011, 9:15 AM

## 2011-08-19 ENCOUNTER — Inpatient Hospital Stay (HOSPITAL_COMMUNITY): Payer: Medicare Other

## 2011-08-19 DIAGNOSIS — M069 Rheumatoid arthritis, unspecified: Secondary | ICD-10-CM | POA: Diagnosis not present

## 2011-08-19 DIAGNOSIS — E871 Hypo-osmolality and hyponatremia: Secondary | ICD-10-CM | POA: Diagnosis not present

## 2011-08-19 DIAGNOSIS — J9819 Other pulmonary collapse: Secondary | ICD-10-CM | POA: Diagnosis not present

## 2011-08-19 DIAGNOSIS — Z96659 Presence of unspecified artificial knee joint: Secondary | ICD-10-CM | POA: Diagnosis not present

## 2011-08-19 DIAGNOSIS — Z471 Aftercare following joint replacement surgery: Secondary | ICD-10-CM | POA: Diagnosis not present

## 2011-08-19 DIAGNOSIS — M8448XA Pathological fracture, other site, initial encounter for fracture: Secondary | ICD-10-CM | POA: Diagnosis not present

## 2011-08-19 DIAGNOSIS — M6281 Muscle weakness (generalized): Secondary | ICD-10-CM | POA: Diagnosis not present

## 2011-08-19 DIAGNOSIS — R0989 Other specified symptoms and signs involving the circulatory and respiratory systems: Secondary | ICD-10-CM | POA: Diagnosis not present

## 2011-08-19 DIAGNOSIS — J069 Acute upper respiratory infection, unspecified: Secondary | ICD-10-CM | POA: Diagnosis not present

## 2011-08-19 DIAGNOSIS — M1990 Unspecified osteoarthritis, unspecified site: Secondary | ICD-10-CM | POA: Diagnosis not present

## 2011-08-19 DIAGNOSIS — M79609 Pain in unspecified limb: Secondary | ICD-10-CM | POA: Diagnosis not present

## 2011-08-19 DIAGNOSIS — K5900 Constipation, unspecified: Secondary | ICD-10-CM | POA: Diagnosis not present

## 2011-08-19 DIAGNOSIS — Z9289 Personal history of other medical treatment: Secondary | ICD-10-CM

## 2011-08-19 DIAGNOSIS — F411 Generalized anxiety disorder: Secondary | ICD-10-CM | POA: Diagnosis not present

## 2011-08-19 DIAGNOSIS — M949 Disorder of cartilage, unspecified: Secondary | ICD-10-CM | POA: Diagnosis not present

## 2011-08-19 DIAGNOSIS — M25569 Pain in unspecified knee: Secondary | ICD-10-CM | POA: Diagnosis not present

## 2011-08-19 DIAGNOSIS — D62 Acute posthemorrhagic anemia: Secondary | ICD-10-CM | POA: Diagnosis not present

## 2011-08-19 DIAGNOSIS — M1710 Unilateral primary osteoarthritis, unspecified knee: Secondary | ICD-10-CM | POA: Diagnosis not present

## 2011-08-19 DIAGNOSIS — R05 Cough: Secondary | ICD-10-CM | POA: Diagnosis not present

## 2011-08-19 DIAGNOSIS — R269 Unspecified abnormalities of gait and mobility: Secondary | ICD-10-CM | POA: Diagnosis not present

## 2011-08-19 LAB — BASIC METABOLIC PANEL
BUN: 11 mg/dL (ref 6–23)
CO2: 27 mEq/L (ref 19–32)
Calcium: 8.8 mg/dL (ref 8.4–10.5)
GFR calc non Af Amer: 81 mL/min — ABNORMAL LOW (ref >90)
Glucose, Bld: 120 mg/dL — ABNORMAL HIGH (ref 70–99)

## 2011-08-19 LAB — TYPE AND SCREEN
ABO/RH(D): B POS
Antibody Screen: NEGATIVE
Unit division: 0
Unit division: 0

## 2011-08-19 LAB — CBC
HCT: 29.2 % — ABNORMAL LOW (ref 36.0–46.0)
Hemoglobin: 9.8 g/dL — ABNORMAL LOW (ref 12.0–15.0)
MCH: 29.6 pg (ref 26.0–34.0)
MCHC: 33.6 g/dL (ref 30.0–36.0)
RBC: 3.31 MIL/uL — ABNORMAL LOW (ref 3.87–5.11)

## 2011-08-19 MED ORDER — DSS 100 MG PO CAPS: 100.0000 mg | ORAL_CAPSULE | Freq: Two times a day (BID) | ORAL | Status: AC

## 2011-08-19 MED ORDER — BISACODYL 10 MG RE SUPP: 10.0000 mg | Freq: Every day | RECTAL | Status: AC | PRN

## 2011-08-19 MED ORDER — AZITHROMYCIN 250 MG PO TABS: ORAL_TABLET | ORAL | Status: AC

## 2011-08-19 MED ORDER — LEVALBUTEROL HCL 0.63 MG/3ML IN NEBU
0.6300 mg | INHALATION_SOLUTION | Freq: Four times a day (QID) | RESPIRATORY_TRACT | Status: DC | PRN
  Filled 2011-08-19: qty 3

## 2011-08-19 MED ORDER — AZITHROMYCIN 500 MG PO TABS
500.0000 mg | ORAL_TABLET | Freq: Every day | ORAL | Status: AC
  Administered 2011-08-19: 500 mg via ORAL
  Filled 2011-08-19: qty 1

## 2011-08-19 MED ORDER — RIVAROXABAN 10 MG PO TABS: 10.0000 mg | ORAL_TABLET | Freq: Every day | ORAL | Status: DC

## 2011-08-19 MED ORDER — ONDANSETRON HCL 4 MG PO TABS: 4.0000 mg | ORAL_TABLET | Freq: Four times a day (QID) | ORAL | Status: AC | PRN

## 2011-08-19 MED ORDER — ALPRAZOLAM 0.25 MG PO TABS: 0.2500 mg | ORAL_TABLET | Freq: Three times a day (TID) | ORAL | Status: AC | PRN

## 2011-08-19 MED ORDER — HYDROMORPHONE HCL 2 MG PO TABS: 2.0000 mg | ORAL_TABLET | ORAL | Status: AC | PRN

## 2011-08-19 MED ORDER — AZITHROMYCIN 250 MG PO TABS: 250.0000 mg | ORAL_TABLET | Freq: Every day | ORAL | Status: DC

## 2011-08-19 MED ORDER — LEVALBUTEROL HCL 0.63 MG/3ML IN NEBU
0.6300 mg | INHALATION_SOLUTION | Freq: Once | RESPIRATORY_TRACT | Status: AC
  Administered 2011-08-19: 0.63 mg via RESPIRATORY_TRACT
  Filled 2011-08-19: qty 3

## 2011-08-19 MED ORDER — METHOCARBAMOL 500 MG PO TABS: 500.0000 mg | ORAL_TABLET | Freq: Four times a day (QID) | ORAL | Status: AC | PRN

## 2011-08-19 MED ORDER — ACETAMINOPHEN 325 MG PO TABS: 650.0000 mg | ORAL_TABLET | Freq: Four times a day (QID) | ORAL | Status: AC | PRN

## 2011-08-19 NOTE — Progress Notes (Signed)
Occupational Therapy Note Order noted. Will defer OT eval to SNF. Judithann Sauger OTR/L [phone] 08/19/2011

## 2011-08-19 NOTE — Progress Notes (Signed)
Physical Therapy Treatment Patient Details Name: [name] [name] MRN: [id number] DOB: 1934/07/12 Today's Date: 08/19/2011 Time: 1411-1500 PT Time Calculation (min): 49 min  PT Assessment / Plan / Recommendation Comments on Treatment Session       Follow Up Recommendations  Skilled nursing facility    Barriers to Discharge        Equipment Recommendations  Defer to next venue    Recommendations for Other Services OT consult  Frequency 7X/week   Plan Discharge plan remains appropriate    Precautions / Restrictions Precautions Precautions: Knee Required Braces or Orthoses: Knee Immobilizer - Right Knee Immobilizer - Right: Discontinue once straight leg raise with < 10 degree lag Restrictions Weight Bearing Restrictions: No Other Position/Activity Restrictions: WBAT   Pertinent Vitals/Pain     Mobility  Bed Mobility Bed Mobility: Supine to Sit;Sit to Supine Supine to Sit: 4: Min assist Sit to Supine: 4: Min assist Details for Bed Mobility Assistance: cues for sequence and use of L LE to self assist Transfers Transfers: Sit to Stand;Stand to Sit Sit to Stand: 4: Min assist;3: Mod assist Stand to Sit: 4: Min assist;3: Mod assist Details for Transfer Assistance: cues for use of UEs and for LE management Ambulation/Gait Ambulation/Gait Assistance: 4: Min assist;3: Mod assist Ambulation Distance (Feet): 28 Feet (28+20') Assistive device: Rolling walker Ambulation/Gait Assistance Details: cues for posture, sequence, and position from RW Gait Pattern: Step-to pattern Gait velocity: slow    Exercises Total Joint Exercises Ankle Circles/Pumps: AROM;20 reps;Supine;Both Quad Sets: AROM;20 reps;Supine;Both Heel Slides: AAROM;20 reps;Supine;Right Straight Leg Raises: AAROM;Right;20 reps;Supine   PT Diagnosis:    PT Problem List:   PT Treatment Interventions:     PT Goals Acute Rehab PT Goals PT Goal Formulation: With patient Time For Goal Achievement:  08/23/11 Potential to Achieve Goals: Good Pt will go Supine/Side to Sit: with supervision PT Goal: Supine/Side to Sit - Progress: Progressing toward goal Pt will go Sit to Supine/Side: with supervision PT Goal: Sit to Supine/Side - Progress: Progressing toward goal Pt will go Sit to Stand: with supervision PT Goal: Sit to Stand - Progress: Progressing toward goal Pt will go Stand to Sit: with supervision PT Goal: Stand to Sit - Progress: Progressing toward goal Pt will Ambulate: 51 - 150 feet;with supervision;with rolling walker PT Goal: Ambulate - Progress: Progressing toward goal  Visit Information  Last PT Received On: 08/19/11 Assistance Needed: +1    Subjective Data  Subjective: I've had a rough day Patient Stated Goal: rehab and then home with husband   Cognition  Overall Cognitive Status: Appears within functional limits for tasks assessed/performed Arousal/Alertness: Awake/alert Orientation Level: Appears intact for tasks assessed Behavior During Session: [facility] for tasks performed    Balance     End of Session PT - End of Session Equipment Utilized During Treatment: Right knee immobilizer Activity Tolerance: Other (comment) (c/o nausea limiting ambulation) Patient left: in bed;with call bell/phone within reach Nurse Communication: Mobility status   GP     Denya Buckingham 08/19/2011, 4:05 PM

## 2011-08-19 NOTE — Progress Notes (Signed)
Subjective: 3 Days Post-Op Procedure(s) (LRB): TOTAL KNEE ARTHROPLASTY (Right) Patient reports pain as mild and moderate.   Patient seen in rounds with Dr. Lequita Halt.  She has had a cough.  CXR this morning. Patient is having problems with pain in the knee, requiring pain medications CXR this morning: IMPRESSION:  Stable moderate cardiac silhouette enlargement. Hazy opacity in  right suprahilar region is similar to previous study. This may be  vascular but I cannot exclude infiltrate or nodularity in this  area. Elevation of right hemidiaphragm with minimal right basilar  atelectasis. Minimal atelectasis in lower left lung on PA image.On  the lateral image atelectasis and infiltrative densities are seen  posteriorly inferiorly without evidence of consolidation.  Osteopenic appearance of bones. Stable compression fracture what  is probably T11 vertebral body. Will try breathing treatment and place on a Z-pack If does better after treatment, then will send over on Z-pack.  Objective: Vital signs in last 24 hours: Temp:  [98.1 F (36.7 C)-99.6 F (37.6 C)] 99.3 F (37.4 C) (08/02 0520) Pulse Rate:  [77-97] 91  (08/02 0520) Resp:  [16-18] 18  (08/02 0800) BP: (89-106)/(52-67) 98/60 mmHg (08/02 0520) SpO2:  [88 %-93 %] 91 % (08/02 0800)  Intake/Output from previous day:  Intake/Output Summary (Last 24 hours) at 08/19/11 1001 Last data filed at 08/19/11 0801  Gross per 24 hour  Intake 1289.75 ml  Output   1125 ml  Net 164.75 ml    Intake/Output this shift: Total I/O In: -  Out: 250 [Urine:250]  Labs:  [facility] 08/19/11 0353 08/18/11 0330 08/17/11 0438  HGB 9.8* 8.3* 9.5*    Basename 08/19/11 0353 08/18/11 0330  WBC 9.6 9.8  RBC 3.31* 2.83*  HCT 29.2* 25.8*  PLT 197 232    Basename 08/19/11 0353 08/18/11 0330  NA 132* 131*  K 4.0 4.0  CL 97 99  CO2 27 24  BUN 11 17  CREATININE 0.76 0.97  GLUCOSE 120* 137*  CALCIUM 8.8 8.3*   No results found for this  basename: LABPT:2,INR:2 in the last 72 hours  EXAM: General - Patient is Alert, Appropriate and Oriented Extremity - Neurovascular intact Sensation intact distally Dorsiflexion/Plantar flexion intact No cellulitis present Incision - clean, dry, no drainage, healing Motor Function - intact, moving foot and toes well on exam.   Assessment/Plan: 3 Days Post-Op Procedure(s) (LRB): TOTAL KNEE ARTHROPLASTY (Right) Procedure(s) (LRB): TOTAL KNEE ARTHROPLASTY (Right) Past Medical History  Diagnosis Date  . Painful respiration   . Personal history of unspecified circulatory disease   . Palpitations   . Other and unspecified hyperlipidemia   . Esophageal reflux   . Depressive disorder, not elsewhere classified   . Anxiety state, unspecified   . Unspecified arthropathy, multiple sites   . Diaphragmatic hernia without mention of obstruction or gangrene   . External hemorrhoids without mention of complication   . Diverticulosis of colon (without mention of hemorrhage)   . Diarrhea   . Special screening for malignant neoplasms of other sites   . Unspecified vitamin D deficiency   . Other malaise and fatigue 08-11-11    hx. "chronic fatigue syndrome"  . Disorder of bone and cartilage, unspecified   . Helicobacter pylori (H. pylori)   . Macular degeneration (senile) of retina, unspecified   . Shortness of breath 08-11-11    with exertion only at present  . Unspecified cerebral artery occlusion with cerebral infarction 08-11-11    '97/ '06( TIA)-affected lt. side, no residual  .  Gallstones 08-11-11    asymptomatic at present  . Personal history of other diseases of digestive system 08-11-11    "Irittable bowel syndrome"  . Urinary incontinence 08-11-11    wears peripad daily  . Osteoarthrosis, unspecified whether generalized or localized, unspecified site 08-11-11    hx. "rhematoid arthritis", osteoarthritis, DDD, bursitis(hip)   Principal Problem:  *OA (osteoarthritis) of knee Active  Problems:  Postop Hyponatremia  Postoperative anemia due to acute blood loss  Postop Transfusion   Discharge to SNF Diet - Regular diet Follow up - in 2 weeks Activity - WBAT Disposition - Skilled nursing facility Condition Upon Discharge - improving, she does have cough, will try breathing treatment and Z-pack D/C Meds - See DC Summary DVT Prophylaxis - Xarelto  PERKINS, ALEXZANDREW 08/19/2011, 10:01 AM

## 2011-08-19 NOTE — Progress Notes (Signed)
Clinical Social Work Department CLINICAL SOCIAL WORK PLACEMENT NOTE 08/19/2011  Patient:  [name] [name], [name] [name]  Account Number:  [id number] Admit date:  08/16/2011  Clinical Social Worker:  Cori Razor, LCSW  Date/time:  08/19/2011 07:50 AM  Clinical Social Work is seeking post-discharge placement for this patient at the following level of care:   SKILLED NURSING   (*CSW will update this form in Epic as items are completed)     Patient/family provided with [facility] Department of Clinical Social Work's list of facilities offering this level of care within the geographic area requested by the patient (or if unable, by the patient's family).    Patient/family informed of their freedom to choose among providers that offer the needed level of care, that participate in Medicare, Medicaid or managed care program needed by the patient, have an available bed and are willing to accept the patient.    Patient/family informed of [facility]' ownership interest in [facility], as well as of the fact that they are under no obligation to receive care at this facility.  PASARR submitted to [employer] on  PASARR number received from [employer] on 06/02/2009  FL2 transmitted to all facilities in geographic area requested by pt/family on  08/18/2011 FL2 transmitted to all facilities within larger geographic area on   Patient informed that his/her managed care company has contracts with or will negotiate with  certain facilities, including the following:     Patient/family informed of bed offers received:  08/18/2011 Patient chooses bed at  [facility] Physician recommends and patient chooses bed at    Patient to be transferred to [facility] on   Patient to be transferred to facility by   The following physician request were entered in Epic:   Additional Comments:  Cori Razor LCSW [phone]

## 2011-08-19 NOTE — Progress Notes (Signed)
Pt for d/c to SNF today- [facility]. IV d/c'd. Dressing CDI to R knee. Pt continues to have congested coughing with whitish thick phlegm expectorant. Started on ABT Z-pack p.o. & Xopenex breathing tx given per RT. Lung sounds otherwise clear. PT worked with pt & tolerated it as reported. D/C instructions reviewed with pt.

## 2011-08-19 NOTE — Progress Notes (Signed)
Clinical Social Work Department BRIEF PSYCHOSOCIAL ASSESSMENT 08/19/2011  Patient:  BILLIJO, DILLING     Account Number:  [id number]     Admit date:  08/16/2011  Clinical Social Worker:  Candie Chroman  Date/Time:  08/19/2011 07:43 AM  Referred by:  Physician  Date Referred:  08/18/2011 Referred for  SNF Placement   Other Referral:   Interview type:  Patient Other interview type:    PSYCHOSOCIAL DATA Living Status:  HUSBAND Admitted from facility:   Level of care:   Primary support name:  Donovan Kail Primary support relationship to patient:  SPOUSE Degree of support available:   supportive    CURRENT CONCERNS Current Concerns  Post-Acute Placement   Other Concerns:    SOCIAL WORK ASSESSMENT / PLAN Pt is a 77 yr old female living at home prior to hospitalization. CSW met with pt to assist with d/c planning. Pt's spouse had ST rehab at [facility] in the past and pt requested referral to that facility for rehab following hospital d/c. [facility] contacted and bed offer received. Pt plans to d/c to [facility] today if stable for d/c. CSW will assist with d/c planning to SNF.   Assessment/plan status:  Psychosocial Support/Ongoing Assessment of Needs Other assessment/ plan:   Information/referral to community resources:   none neede at this time    PATIENT'S/FAMILY'S RESPONSE TO PLAN OF CARE: Pt is looking forward to rehab at [facility].    Cori Razor LCSW [phone]

## 2011-08-19 NOTE — Discharge Summary (Signed)
Physician Discharge Summary   Patient ID: [name] [name] MRN: [id number] DOB/AGE: 1934-04-21 77 y.o.  Admit date: 08/16/2011 Discharge date: 08/19/2011  Primary Diagnosis: Osteoarthritis Right knee   Admission Diagnoses:  Past Medical History  Diagnosis Date  . Painful respiration   . Personal history of unspecified circulatory disease   . Palpitations   . Other and unspecified hyperlipidemia   . Esophageal reflux   . Depressive disorder, not elsewhere classified   . Anxiety state, unspecified   . Unspecified arthropathy, multiple sites   . Diaphragmatic hernia without mention of obstruction or gangrene   . External hemorrhoids without mention of complication   . Diverticulosis of colon (without mention of hemorrhage)   . Diarrhea   . Special screening for malignant neoplasms of other sites   . Unspecified vitamin D deficiency   . Other malaise and fatigue 08-11-11    hx. "chronic fatigue syndrome"  . Disorder of bone and cartilage, unspecified   . Helicobacter pylori (H. pylori)   . Macular degeneration (senile) of retina, unspecified   . Shortness of breath 08-11-11    with exertion only at present  . Unspecified cerebral artery occlusion with cerebral infarction 08-11-11    '97/ '06( TIA)-affected lt. side, no residual  . Gallstones 08-11-11    asymptomatic at present  . Personal history of other diseases of digestive system 08-11-11    "Irittable bowel syndrome"  . Urinary incontinence 08-11-11    wears peripad daily  . Osteoarthrosis, unspecified whether generalized or localized, unspecified site 08-11-11    hx. "rhematoid arthritis", osteoarthritis, DDD, bursitis(hip)   Discharge Diagnoses:   Principal Problem:  *OA (osteoarthritis) of knee Active Problems:  Postop Hyponatremia  Postoperative anemia due to acute blood loss  Postop Transfusion  Procedure:  Procedure(s) (LRB): TOTAL KNEE ARTHROPLASTY (Right)   Consults: None  HPI: [name] [name] is a 78  y.o. year old female with end stage OA of her right knee with progressively worsening pain and dysfunction. She has constant pain, with activity and at rest and significant functional deficits with difficulties even with ADLs. She has had extensive non-op management including analgesics, injections of cortisone and viscosupplements, and home exercise program, but remains in significant pain with significant dysfunction.Radiographs show bone on bone arthritis all 3 compartments with tibial subluxation. She presents now for right Total Knee Arthroplasty.    Laboratory Data: Hospital Outpatient Visit on 08/11/2011  Component Date Value Range Status  . MRSA, PCR 08/11/2011 NEGATIVE  NEGATIVE Final  . Staphylococcus aureus 08/11/2011 NEGATIVE  NEGATIVE Final   Comment:                                 The Xpert SA Assay (FDA                          approved for NASAL specimens                          only), is one component of                          a comprehensive surveillance                          program.  It is not intended  to diagnose infection nor to                          guide or monitor treatment.  Marland Kitchen aPTT 08/11/2011 31  24 - 37 seconds Final  . WBC 08/11/2011 5.9  4.0 - 10.5 K/uL Final  . RBC 08/11/2011 4.37  3.87 - 5.11 MIL/uL Final  . Hemoglobin 08/11/2011 13.0  12.0 - 15.0 g/dL Final  . HCT 45/40/9811 39.0  36.0 - 46.0 % Final  . MCV 08/11/2011 89.2  78.0 - 100.0 fL Final  . MCH 08/11/2011 29.7  26.0 - 34.0 pg Final  . MCHC 08/11/2011 33.3  30.0 - 36.0 g/dL Final  . RDW 91/47/8295 13.2  11.5 - 15.5 % Final  . Platelets 08/11/2011 288  150 - 400 K/uL Final  . Sodium 08/11/2011 136  135 - 145 mEq/L Final  . Potassium 08/11/2011 4.3  3.5 - 5.1 mEq/L Final  . Chloride 08/11/2011 100  96 - 112 mEq/L Final  . CO2 08/11/2011 27  19 - 32 mEq/L Final  . Glucose, Bld 08/11/2011 109* 70 - 99 mg/dL Final  . BUN 62/13/0865 16  6 - 23 mg/dL Final  .  Creatinine, Ser 08/11/2011 0.98  0.50 - 1.10 mg/dL Final  . Calcium 78/46/9629 9.5  8.4 - 10.5 mg/dL Final  . Total Protein 08/11/2011 7.3  6.0 - 8.3 g/dL Final  . Albumin 52/84/1324 4.0  3.5 - 5.2 g/dL Final  . AST 40/10/2723 19  0 - 37 U/L Final  . ALT 08/11/2011 12  0 - 35 U/L Final  . Alkaline Phosphatase 08/11/2011 62  39 - 117 U/L Final  . Total Bilirubin 08/11/2011 0.2* 0.3 - 1.2 mg/dL Final  . GFR calc non Af Amer 08/11/2011 55* >90 mL/min Final  . GFR calc Af Amer 08/11/2011 64* >90 mL/min Final   Comment:                                 The eGFR has been calculated                          using the CKD EPI equation.                          This calculation has not been                          validated in all clinical                          situations.                          eGFR's persistently                          <90 mL/min signify                          possible Chronic Kidney Disease.  Marland Kitchen Prothrombin Time 08/11/2011 12.8  11.6 - 15.2 seconds Final  . INR 08/11/2011 0.94  0.00 - 1.49 Final  . Color, Urine 08/11/2011 YELLOW  YELLOW Final  . APPearance 08/11/2011 CLEAR  CLEAR Final  . Specific Gravity, Urine 08/11/2011 1.026  1.005 - 1.030 Final  . pH 08/11/2011 5.5  5.0 - 8.0 Final  . Glucose, UA 08/11/2011 NEGATIVE  NEGATIVE mg/dL Final  . Hgb urine dipstick 08/11/2011 NEGATIVE  NEGATIVE Final  . Bilirubin Urine 08/11/2011 NEGATIVE  NEGATIVE Final  . Ketones, ur 08/11/2011 NEGATIVE  NEGATIVE mg/dL Final  . Protein, ur 42/59/5638 NEGATIVE  NEGATIVE mg/dL Final  . Urobilinogen, UA 08/11/2011 0.2  0.0 - 1.0 mg/dL Final  . Nitrite 75/64/3329 NEGATIVE  NEGATIVE Final  . Leukocytes, UA 08/11/2011 NEGATIVE  NEGATIVE Final   MICROSCOPIC NOT DONE ON URINES WITH NEGATIVE PROTEIN, BLOOD, LEUKOCYTES, NITRITE, OR GLUCOSE <1000 mg/dL.    Basename 08/19/11 0353 08/18/11 0330 08/17/11 0438  HGB 9.8* 8.3* 9.5*    Basename 08/19/11 0353 08/18/11 0330  WBC 9.6 9.8  RBC  3.31* 2.83*  HCT 29.2* 25.8*  PLT 197 232    Basename 08/19/11 0353 08/18/11 0330  NA 132* 131*  K 4.0 4.0  CL 97 99  CO2 27 24  BUN 11 17  CREATININE 0.76 0.97  GLUCOSE 120* 137*  CALCIUM 8.8 8.3*   No results found for this basename: LABPT:2,INR:2 in the last 72 hours  X-Rays:Dg Chest 2 View  08/19/2011  *RADIOLOGY REPORT*  Clinical Data: Postoperative coughing and congestion.  CHEST - 2 VIEW  Comparison: 08/17/2011.  Findings: There is stable moderate cardiac silhouette enlargement. Hazy opacity is seen in the right suprahilar region similar to previous study.  This may be vascular but I cannot exclude infiltrate or nodularity in this area.  On the PA image there is elevation of the right hemidiaphragm with minimal right basilar atelectasis.  Minimal atelectasis is also seen in the lower left lung on the PA image.  On the lateral image atelectasis and infiltrative densities are seen posteriorly inferiorly without evidence of consolidation.  No definite pleural effusion is seen.  There is osteopenic appearance of bones. Changes of degenerative disc disease and degenerative spondylosis are seen.  There is a stable compression fracture of what is probably T11.  IMPRESSION: Stable moderate cardiac silhouette enlargement.  Hazy opacity in right suprahilar region is similar to previous study.  This may be vascular but I cannot exclude infiltrate or nodularity in this area. Elevation of right hemidiaphragm with minimal right basilar atelectasis.  Minimal atelectasis in lower left lung on PA image.On the lateral image atelectasis and infiltrative densities are seen posteriorly inferiorly without evidence of consolidation. Osteopenic appearance of bones.  Stable compression fracture what is probably T11 vertebral body.  Original Report Authenticated By: Crawford Givens, M.D.   Dg Chest 2 View  08/18/2011  *RADIOLOGY REPORT*  Clinical Data: Wheezing, cough, low O2 sats  CHEST - 2 VIEW  Comparison: 05/22/2009   Findings: Prominence of the bilateral pulmonary hila with increased opacity in the right suprahilar region.  Bilateral lower lobe opacities, possibly atelectasis. No pleural effusion or pneumothorax.  The heart is mildly enlarged.  Mild degenerative changes of the visualized thoracolumbar spine.  IMPRESSION: Increased opacity in the right suprahilar region.  While possibly vascular, CT chest with contrast is suggested for further evaluation.  Bilateral lower lobe opacities, likely atelectasis.  Original Report Authenticated By: Charline Bills, M.D.    EKG: Orders placed in visit on 07/12/11  . EKG 12-LEAD     Hospital Course: Patient was admitted to [facility] and taken to the OR and underwent the above state procedure without complications.  Patient  tolerated the procedure well and was later transferred to the recovery room and then to the orthopaedic floor for postoperative care.  They were given PO and IV analgesics for pain control following their surgery.  They were given 24 hours of postoperative antibiotics and started on DVT prophylaxis in the form of Xarelto.   PT and OT were ordered for total joint protocol.  Discharge planning consulted to help with postop disposition and equipment needs. She wanted to look into SNF so we got the social worker involved.  Patient had a tough night on the evening of surgery with discomfort and pain but started to get up OOB with therapy on day one.  Hemovac drain was pulled without difficulty.  Continued to work with therapy into day two.  Dressing was changed on day two and the incision was healing well. Patient was having problems with fatigue and indigestion. HGB was down to 8.3. Gave blood that day.  By day three, she had a cough.  Patient was having problems with pain in the knee, requiring pain medications  CXR this morning:  IMPRESSION:  Stable moderate cardiac silhouette enlargement. Hazy opacity in  right suprahilar region is similar to  previous study. This may be  vascular but I cannot exclude infiltrate or nodularity in this  area. Elevation of right hemidiaphragm with minimal right basilar  atelectasis. Minimal atelectasis in lower left lung on PA image.On  the lateral image atelectasis and infiltrative densities are seen  posteriorly inferiorly without evidence of consolidation.  Osteopenic appearance of bones. Stable compression fracture what  is probably T11 vertebral body.  Will try breathing treatment and place on a Z-pack  If does better after treatment, then will send over on Z-pack. Plan will be to go over to SNF later today as long as she continues to improve.  Discharge Medications: Prior to Admission medications   Medication Sig Start Date End Date Taking? Authorizing Provider  atorvastatin (LIPITOR) 10 MG tablet Take 10 mg by mouth every morning. 12/14/10  Yes Judy Pimple, MD  esomeprazole (NEXIUM) 40 MG capsule Take 1 capsule (40 mg total) by mouth 2 (two) times daily before a meal. 12/14/10  Yes Judy Pimple, MD  hydrocortisone (ANUSOL-HC) 2.5 % rectal cream Place rectally 2 (two) times daily. 08/09/11 08/19/11 Yes Eustaquio Boyden, MD  hydrocortisone (ANUSOL-HC) 25 MG suppository Place 1 suppository (25 mg total) rectally 2 (two) times daily. 08/09/11 08/19/11 Yes Eustaquio Boyden, MD  polyethylene glycol [facility] / GLYCOLAX) packet Take 17 g by mouth 2 (two) times daily.   Yes Historical Provider, MD  Pseudoephedrine-DM-GG-APAP (TYLENOL COLD) 30-15-200-325 MG TABS Take 2 tablets by mouth 2 (two) times daily as needed. For congestion/headache   Yes Historical Provider, MD  sertraline (ZOLOFT) 100 MG tablet Take 1 tablet (100 mg total) by mouth 2 (two) times daily. 12/14/10  Yes Judy Pimple, MD  acetaminophen (TYLENOL) 325 MG tablet Take 2 tablets (650 mg total) by mouth every 6 (six) hours as needed (or Fever >/= 101). 08/19/11 08/18/12  Aspin Palomarez, PA  ALPRAZolam (XANAX) 0.25 MG tablet Take 1 tablet  (0.25 mg total) by mouth 3 (three) times daily as needed for anxiety. 08/19/11 09/18/11  Valery Amedee Julien Girt, PA  ALPRAZolam Prudy Feeler) 0.5 MG tablet Take 1 tablet (0.5 mg total) by mouth at bedtime as needed for anxiety. 05/06/11   Judy Pimple, MD  azithromycin (ZITHROMAX) 250 MG tablet Take daily for four more days starting on Saturday 08/20/2011 08/19/11 08/24/11  Sania Noy Julien Girt, PA  bisacodyl (DULCOLAX) 10 MG suppository Place 1 suppository (10 mg total) rectally daily as needed. 08/19/11 08/29/11  Hart Haas, PA  docusate sodium 100 MG CAPS Take 100 mg by mouth 2 (two) times daily. 08/19/11 08/29/11  Donal Lynam, PA  HYDROmorphone (DILAUDID) 2 MG tablet Take 1-2 tablets (2-4 mg total) by mouth every 4 (four) hours as needed. 08/19/11 08/29/11  Maralyn Witherell, PA  methocarbamol (ROBAXIN) 500 MG tablet Take 1 tablet (500 mg total) by mouth every 6 (six) hours as needed. 08/19/11 08/29/11  Mikesha Migliaccio, PA  ondansetron (ZOFRAN) 4 MG tablet Take 1 tablet (4 mg total) by mouth every 6 (six) hours as needed for nausea. 08/19/11 08/26/11  Sherisse Fullilove Julien Girt, PA  rivaroxaban (XARELTO) 10 MG TABS tablet Take 1 tablet (10 mg total) by mouth daily with breakfast. Take Xarelto for two and a half more weeks, then discontinue Xarelto. Once the patient has completed the Xarelto, they may resume Plavix 75 mg. 08/19/11   Garrick Midgley Julien Girt, PA  sulfamethoxazole-trimethoprim (BACTRIM DS) 800-160 MG per tablet Take 1 tablet by mouth at bedtime. Hold while on the Z-Pack and resume when completed.    Historical Provider, MD    Diet: Regular diet Activity:WBAT Follow-up:in 2 weeks Disposition - Skilled nursing facility Discharged Condition: stable, transfer if improved.   Discharge Orders    Future Orders Please Complete By Expires   Diet - low sodium heart healthy      Call MD / Call 911      Comments:   If you experience chest pain or shortness of breath, CALL 911 and be transported to the hospital  emergency room.  If you develope a fever above 101 F, pus (white drainage) or increased drainage or redness at the wound, or calf pain, call your surgeon's office.   Discharge instructions      Comments:   Pick up stool softner and laxative for home. Do not submerge incision under water. May shower. Continue to use ice for pain and swelling from surgery.  Take Xarelto for two and a half more weeks, then discontinue Xarelto. Once the patient has completed the Xarelto, they may resume the Plavix 75 mg.   Constipation Prevention      Comments:   Drink plenty of fluids.  Prune juice may be helpful.  You may use a stool softener, such as Colace (over the counter) 100 mg twice a day.  Use MiraLax (over the counter) for constipation as needed.   Increase activity slowly as tolerated      Patient may shower      Comments:   You may shower without a dressing once there is no drainage.  Do not wash over the wound.  If drainage remains, do not shower until drainage stops.   Driving restrictions      Comments:   No driving until released by the physician.   Lifting restrictions      Comments:   No lifting until released by the physician.   TED hose      Comments:   Use stockings (TED hose) for 3 weeks on both leg(s).  You may remove them at night for sleeping.   Change dressing      Comments:   Change dressing daily with sterile 4 x 4 inch gauze dressing and apply TED hose. Do not submerge the incision under water.   Do not put a pillow under the knee. Place it under the heel.  Do not sit on low chairs, stoools or toilet seats, as it may be difficult to get up from low surfaces        Medication List  As of 08/19/2011 11:55 AM   STOP taking these medications         clopidogrel 75 MG tablet      HYDROcodone-acetaminophen 10-325 MG per tablet      raloxifene 60 MG tablet      VIACTIV 500-500-40 MG-UNT-MCG Chew         TAKE these medications         acetaminophen 325 MG tablet    Commonly known as: TYLENOL   Take 2 tablets (650 mg total) by mouth every 6 (six) hours as needed (or Fever >/= 101).      ALPRAZolam 0.5 MG tablet   Commonly known as: XANAX   Take 1 tablet (0.5 mg total) by mouth at bedtime as needed for anxiety.      ALPRAZolam 0.25 MG tablet   Commonly known as: XANAX   Take 1 tablet (0.25 mg total) by mouth 3 (three) times daily as needed for anxiety.      atorvastatin 10 MG tablet   Commonly known as: LIPITOR   Take 10 mg by mouth every morning.      azithromycin 250 MG tablet   Commonly known as: ZITHROMAX   Take daily for four more days starting on Saturday 08/20/2011      bisacodyl 10 MG suppository   Commonly known as: DULCOLAX   Place 1 suppository (10 mg total) rectally daily as needed.      DSS 100 MG Caps   Take 100 mg by mouth 2 (two) times daily.      esomeprazole 40 MG capsule   Commonly known as: NEXIUM   Take 1 capsule (40 mg total) by mouth 2 (two) times daily before a meal.      hydrocortisone 2.5 % rectal cream   Commonly known as: ANUSOL-HC   Place rectally 2 (two) times daily.      hydrocortisone 25 MG suppository   Commonly known as: ANUSOL-HC   Place 1 suppository (25 mg total) rectally 2 (two) times daily.      HYDROmorphone 2 MG tablet   Commonly known as: DILAUDID   Take 1-2 tablets (2-4 mg total) by mouth every 4 (four) hours as needed.      methocarbamol 500 MG tablet   Commonly known as: ROBAXIN   Take 1 tablet (500 mg total) by mouth every 6 (six) hours as needed.      ondansetron 4 MG tablet   Commonly known as: ZOFRAN   Take 1 tablet (4 mg total) by mouth every 6 (six) hours as needed for nausea.      polyethylene glycol packet   Commonly known as: MIRALAX / GLYCOLAX   Take 17 g by mouth 2 (two) times daily.      rivaroxaban 10 MG Tabs tablet   Commonly known as: XARELTO   Take 1 tablet (10 mg total) by mouth daily with breakfast. Take Xarelto for two and a half more weeks, then discontinue  Xarelto.  Once the patient has completed the Xarelto, they may resume Plavix 75 mg.      sertraline 100 MG tablet   Commonly known as: ZOLOFT   Take 1 tablet (100 mg total) by mouth 2 (two) times daily.      sulfamethoxazole-trimethoprim 800-160 MG per tablet   Commonly known as:  BACTRIM DS   Take 1 tablet by mouth at bedtime. Hold while on the Z-Pack and resume when completed.      TYLENOL COLD 30-15-200-325 MG Tabs   Generic drug: Pseudoephedrine-DM-GG-APAP   Take 2 tablets by mouth 2 (two) times daily as needed. For congestion/headache           Follow-up Information    Follow up with Loanne Drilling, MD. Schedule an appointment as soon as possible for a visit in 2 weeks.   Contact information:   [facility] [address] [place] [address] [phone]          Signed: Patrica Duel 08/19/2011, 11:55 AM

## 2011-08-22 DIAGNOSIS — F411 Generalized anxiety disorder: Secondary | ICD-10-CM | POA: Diagnosis not present

## 2011-08-22 DIAGNOSIS — J069 Acute upper respiratory infection, unspecified: Secondary | ICD-10-CM | POA: Diagnosis not present

## 2011-08-22 DIAGNOSIS — M6281 Muscle weakness (generalized): Secondary | ICD-10-CM | POA: Diagnosis not present

## 2011-08-22 DIAGNOSIS — M1710 Unilateral primary osteoarthritis, unspecified knee: Secondary | ICD-10-CM | POA: Diagnosis not present

## 2011-08-22 DIAGNOSIS — K5900 Constipation, unspecified: Secondary | ICD-10-CM | POA: Diagnosis not present

## 2011-08-22 DIAGNOSIS — Z96659 Presence of unspecified artificial knee joint: Secondary | ICD-10-CM | POA: Diagnosis not present

## 2011-08-22 NOTE — Progress Notes (Signed)
CSW assisted with d/c planning to [facility] on 08/19/11 via [facility] transport.  Cori Razor LCSW [phone]

## 2011-09-08 DIAGNOSIS — F329 Major depressive disorder, single episode, unspecified: Secondary | ICD-10-CM | POA: Diagnosis not present

## 2011-09-08 DIAGNOSIS — D62 Acute posthemorrhagic anemia: Secondary | ICD-10-CM | POA: Diagnosis not present

## 2011-09-08 DIAGNOSIS — M5126 Other intervertebral disc displacement, lumbar region: Secondary | ICD-10-CM | POA: Diagnosis not present

## 2011-09-08 DIAGNOSIS — IMO0001 Reserved for inherently not codable concepts without codable children: Secondary | ICD-10-CM | POA: Diagnosis not present

## 2011-09-08 DIAGNOSIS — Z471 Aftercare following joint replacement surgery: Secondary | ICD-10-CM | POA: Diagnosis not present

## 2011-09-09 DIAGNOSIS — M5126 Other intervertebral disc displacement, lumbar region: Secondary | ICD-10-CM | POA: Diagnosis not present

## 2011-09-09 DIAGNOSIS — F329 Major depressive disorder, single episode, unspecified: Secondary | ICD-10-CM | POA: Diagnosis not present

## 2011-09-09 DIAGNOSIS — Z471 Aftercare following joint replacement surgery: Secondary | ICD-10-CM | POA: Diagnosis not present

## 2011-09-09 DIAGNOSIS — IMO0001 Reserved for inherently not codable concepts without codable children: Secondary | ICD-10-CM | POA: Diagnosis not present

## 2011-09-09 DIAGNOSIS — D62 Acute posthemorrhagic anemia: Secondary | ICD-10-CM | POA: Diagnosis not present

## 2011-09-12 DIAGNOSIS — F329 Major depressive disorder, single episode, unspecified: Secondary | ICD-10-CM | POA: Diagnosis not present

## 2011-09-12 DIAGNOSIS — IMO0001 Reserved for inherently not codable concepts without codable children: Secondary | ICD-10-CM | POA: Diagnosis not present

## 2011-09-12 DIAGNOSIS — M5126 Other intervertebral disc displacement, lumbar region: Secondary | ICD-10-CM | POA: Diagnosis not present

## 2011-09-12 DIAGNOSIS — Z471 Aftercare following joint replacement surgery: Secondary | ICD-10-CM | POA: Diagnosis not present

## 2011-09-12 DIAGNOSIS — D62 Acute posthemorrhagic anemia: Secondary | ICD-10-CM | POA: Diagnosis not present

## 2011-09-13 DIAGNOSIS — IMO0001 Reserved for inherently not codable concepts without codable children: Secondary | ICD-10-CM | POA: Diagnosis not present

## 2011-09-13 DIAGNOSIS — M5126 Other intervertebral disc displacement, lumbar region: Secondary | ICD-10-CM | POA: Diagnosis not present

## 2011-09-13 DIAGNOSIS — F329 Major depressive disorder, single episode, unspecified: Secondary | ICD-10-CM | POA: Diagnosis not present

## 2011-09-13 DIAGNOSIS — Z471 Aftercare following joint replacement surgery: Secondary | ICD-10-CM | POA: Diagnosis not present

## 2011-09-13 DIAGNOSIS — D62 Acute posthemorrhagic anemia: Secondary | ICD-10-CM | POA: Diagnosis not present

## 2011-09-14 DIAGNOSIS — Z471 Aftercare following joint replacement surgery: Secondary | ICD-10-CM | POA: Diagnosis not present

## 2011-09-14 DIAGNOSIS — M5126 Other intervertebral disc displacement, lumbar region: Secondary | ICD-10-CM | POA: Diagnosis not present

## 2011-09-14 DIAGNOSIS — D62 Acute posthemorrhagic anemia: Secondary | ICD-10-CM | POA: Diagnosis not present

## 2011-09-14 DIAGNOSIS — F329 Major depressive disorder, single episode, unspecified: Secondary | ICD-10-CM | POA: Diagnosis not present

## 2011-09-14 DIAGNOSIS — IMO0001 Reserved for inherently not codable concepts without codable children: Secondary | ICD-10-CM | POA: Diagnosis not present

## 2011-09-15 DIAGNOSIS — F329 Major depressive disorder, single episode, unspecified: Secondary | ICD-10-CM | POA: Diagnosis not present

## 2011-09-15 DIAGNOSIS — M5126 Other intervertebral disc displacement, lumbar region: Secondary | ICD-10-CM | POA: Diagnosis not present

## 2011-09-15 DIAGNOSIS — D62 Acute posthemorrhagic anemia: Secondary | ICD-10-CM | POA: Diagnosis not present

## 2011-09-15 DIAGNOSIS — Z471 Aftercare following joint replacement surgery: Secondary | ICD-10-CM | POA: Diagnosis not present

## 2011-09-15 DIAGNOSIS — IMO0001 Reserved for inherently not codable concepts without codable children: Secondary | ICD-10-CM | POA: Diagnosis not present

## 2011-09-16 DIAGNOSIS — F329 Major depressive disorder, single episode, unspecified: Secondary | ICD-10-CM | POA: Diagnosis not present

## 2011-09-16 DIAGNOSIS — Z471 Aftercare following joint replacement surgery: Secondary | ICD-10-CM | POA: Diagnosis not present

## 2011-09-16 DIAGNOSIS — IMO0001 Reserved for inherently not codable concepts without codable children: Secondary | ICD-10-CM | POA: Diagnosis not present

## 2011-09-16 DIAGNOSIS — M5126 Other intervertebral disc displacement, lumbar region: Secondary | ICD-10-CM | POA: Diagnosis not present

## 2011-09-16 DIAGNOSIS — D62 Acute posthemorrhagic anemia: Secondary | ICD-10-CM | POA: Diagnosis not present

## 2011-09-19 DIAGNOSIS — IMO0001 Reserved for inherently not codable concepts without codable children: Secondary | ICD-10-CM | POA: Diagnosis not present

## 2011-09-19 DIAGNOSIS — D62 Acute posthemorrhagic anemia: Secondary | ICD-10-CM | POA: Diagnosis not present

## 2011-09-19 DIAGNOSIS — M5126 Other intervertebral disc displacement, lumbar region: Secondary | ICD-10-CM | POA: Diagnosis not present

## 2011-09-19 DIAGNOSIS — F329 Major depressive disorder, single episode, unspecified: Secondary | ICD-10-CM | POA: Diagnosis not present

## 2011-09-19 DIAGNOSIS — Z471 Aftercare following joint replacement surgery: Secondary | ICD-10-CM | POA: Diagnosis not present

## 2011-09-20 DIAGNOSIS — D62 Acute posthemorrhagic anemia: Secondary | ICD-10-CM | POA: Diagnosis not present

## 2011-09-20 DIAGNOSIS — F329 Major depressive disorder, single episode, unspecified: Secondary | ICD-10-CM | POA: Diagnosis not present

## 2011-09-20 DIAGNOSIS — Z471 Aftercare following joint replacement surgery: Secondary | ICD-10-CM | POA: Diagnosis not present

## 2011-09-20 DIAGNOSIS — IMO0001 Reserved for inherently not codable concepts without codable children: Secondary | ICD-10-CM | POA: Diagnosis not present

## 2011-09-20 DIAGNOSIS — M5126 Other intervertebral disc displacement, lumbar region: Secondary | ICD-10-CM | POA: Diagnosis not present

## 2011-09-21 DIAGNOSIS — F329 Major depressive disorder, single episode, unspecified: Secondary | ICD-10-CM | POA: Diagnosis not present

## 2011-09-21 DIAGNOSIS — IMO0001 Reserved for inherently not codable concepts without codable children: Secondary | ICD-10-CM | POA: Diagnosis not present

## 2011-09-21 DIAGNOSIS — Z471 Aftercare following joint replacement surgery: Secondary | ICD-10-CM | POA: Diagnosis not present

## 2011-09-21 DIAGNOSIS — D62 Acute posthemorrhagic anemia: Secondary | ICD-10-CM | POA: Diagnosis not present

## 2011-09-21 DIAGNOSIS — M5126 Other intervertebral disc displacement, lumbar region: Secondary | ICD-10-CM | POA: Diagnosis not present

## 2011-09-22 DIAGNOSIS — F329 Major depressive disorder, single episode, unspecified: Secondary | ICD-10-CM | POA: Diagnosis not present

## 2011-09-22 DIAGNOSIS — Z471 Aftercare following joint replacement surgery: Secondary | ICD-10-CM | POA: Diagnosis not present

## 2011-09-22 DIAGNOSIS — M5126 Other intervertebral disc displacement, lumbar region: Secondary | ICD-10-CM | POA: Diagnosis not present

## 2011-09-22 DIAGNOSIS — IMO0001 Reserved for inherently not codable concepts without codable children: Secondary | ICD-10-CM | POA: Diagnosis not present

## 2011-09-22 DIAGNOSIS — D62 Acute posthemorrhagic anemia: Secondary | ICD-10-CM | POA: Diagnosis not present

## 2011-09-23 DIAGNOSIS — Z471 Aftercare following joint replacement surgery: Secondary | ICD-10-CM | POA: Diagnosis not present

## 2011-09-23 DIAGNOSIS — M1710 Unilateral primary osteoarthritis, unspecified knee: Secondary | ICD-10-CM | POA: Diagnosis not present

## 2011-09-23 DIAGNOSIS — F329 Major depressive disorder, single episode, unspecified: Secondary | ICD-10-CM | POA: Diagnosis not present

## 2011-09-23 DIAGNOSIS — IMO0001 Reserved for inherently not codable concepts without codable children: Secondary | ICD-10-CM | POA: Diagnosis not present

## 2011-09-23 DIAGNOSIS — M5126 Other intervertebral disc displacement, lumbar region: Secondary | ICD-10-CM | POA: Diagnosis not present

## 2011-09-23 DIAGNOSIS — D62 Acute posthemorrhagic anemia: Secondary | ICD-10-CM | POA: Diagnosis not present

## 2011-09-27 DIAGNOSIS — F329 Major depressive disorder, single episode, unspecified: Secondary | ICD-10-CM | POA: Diagnosis not present

## 2011-09-27 DIAGNOSIS — D62 Acute posthemorrhagic anemia: Secondary | ICD-10-CM | POA: Diagnosis not present

## 2011-09-27 DIAGNOSIS — M5126 Other intervertebral disc displacement, lumbar region: Secondary | ICD-10-CM | POA: Diagnosis not present

## 2011-09-27 DIAGNOSIS — IMO0001 Reserved for inherently not codable concepts without codable children: Secondary | ICD-10-CM | POA: Diagnosis not present

## 2011-09-27 DIAGNOSIS — Z471 Aftercare following joint replacement surgery: Secondary | ICD-10-CM | POA: Diagnosis not present

## 2011-09-28 DIAGNOSIS — D62 Acute posthemorrhagic anemia: Secondary | ICD-10-CM | POA: Diagnosis not present

## 2011-09-28 DIAGNOSIS — IMO0001 Reserved for inherently not codable concepts without codable children: Secondary | ICD-10-CM | POA: Diagnosis not present

## 2011-09-28 DIAGNOSIS — Z471 Aftercare following joint replacement surgery: Secondary | ICD-10-CM | POA: Diagnosis not present

## 2011-09-28 DIAGNOSIS — F329 Major depressive disorder, single episode, unspecified: Secondary | ICD-10-CM | POA: Diagnosis not present

## 2011-09-28 DIAGNOSIS — M5126 Other intervertebral disc displacement, lumbar region: Secondary | ICD-10-CM | POA: Diagnosis not present

## 2011-09-29 DIAGNOSIS — F329 Major depressive disorder, single episode, unspecified: Secondary | ICD-10-CM | POA: Diagnosis not present

## 2011-09-29 DIAGNOSIS — Z471 Aftercare following joint replacement surgery: Secondary | ICD-10-CM | POA: Diagnosis not present

## 2011-09-29 DIAGNOSIS — IMO0001 Reserved for inherently not codable concepts without codable children: Secondary | ICD-10-CM | POA: Diagnosis not present

## 2011-09-29 DIAGNOSIS — D62 Acute posthemorrhagic anemia: Secondary | ICD-10-CM | POA: Diagnosis not present

## 2011-09-29 DIAGNOSIS — M5126 Other intervertebral disc displacement, lumbar region: Secondary | ICD-10-CM | POA: Diagnosis not present

## 2011-10-03 DIAGNOSIS — F329 Major depressive disorder, single episode, unspecified: Secondary | ICD-10-CM | POA: Diagnosis not present

## 2011-10-03 DIAGNOSIS — D62 Acute posthemorrhagic anemia: Secondary | ICD-10-CM | POA: Diagnosis not present

## 2011-10-03 DIAGNOSIS — Z471 Aftercare following joint replacement surgery: Secondary | ICD-10-CM | POA: Diagnosis not present

## 2011-10-03 DIAGNOSIS — M5126 Other intervertebral disc displacement, lumbar region: Secondary | ICD-10-CM | POA: Diagnosis not present

## 2011-10-03 DIAGNOSIS — IMO0001 Reserved for inherently not codable concepts without codable children: Secondary | ICD-10-CM | POA: Diagnosis not present

## 2011-10-04 DIAGNOSIS — Z471 Aftercare following joint replacement surgery: Secondary | ICD-10-CM | POA: Diagnosis not present

## 2011-10-04 DIAGNOSIS — M5126 Other intervertebral disc displacement, lumbar region: Secondary | ICD-10-CM | POA: Diagnosis not present

## 2011-10-04 DIAGNOSIS — D62 Acute posthemorrhagic anemia: Secondary | ICD-10-CM | POA: Diagnosis not present

## 2011-10-04 DIAGNOSIS — IMO0001 Reserved for inherently not codable concepts without codable children: Secondary | ICD-10-CM | POA: Diagnosis not present

## 2011-10-04 DIAGNOSIS — F329 Major depressive disorder, single episode, unspecified: Secondary | ICD-10-CM | POA: Diagnosis not present

## 2011-10-07 DIAGNOSIS — D62 Acute posthemorrhagic anemia: Secondary | ICD-10-CM | POA: Diagnosis not present

## 2011-10-07 DIAGNOSIS — IMO0001 Reserved for inherently not codable concepts without codable children: Secondary | ICD-10-CM | POA: Diagnosis not present

## 2011-10-07 DIAGNOSIS — M5126 Other intervertebral disc displacement, lumbar region: Secondary | ICD-10-CM | POA: Diagnosis not present

## 2011-10-07 DIAGNOSIS — Z471 Aftercare following joint replacement surgery: Secondary | ICD-10-CM | POA: Diagnosis not present

## 2011-10-07 DIAGNOSIS — F329 Major depressive disorder, single episode, unspecified: Secondary | ICD-10-CM | POA: Diagnosis not present

## 2011-10-11 DIAGNOSIS — M47817 Spondylosis without myelopathy or radiculopathy, lumbosacral region: Secondary | ICD-10-CM | POA: Diagnosis not present

## 2011-10-13 ENCOUNTER — Other Ambulatory Visit: Payer: Self-pay | Admitting: Neurosurgery

## 2011-10-13 DIAGNOSIS — M47817 Spondylosis without myelopathy or radiculopathy, lumbosacral region: Secondary | ICD-10-CM

## 2011-10-17 ENCOUNTER — Encounter: Payer: Self-pay | Admitting: Internal Medicine

## 2011-10-17 ENCOUNTER — Encounter: Payer: Self-pay | Admitting: Family Medicine

## 2011-10-17 ENCOUNTER — Ambulatory Visit (INDEPENDENT_AMBULATORY_CARE_PROVIDER_SITE_OTHER): Payer: Medicare Other | Admitting: Family Medicine

## 2011-10-17 VITALS — BP 122/72 | HR 76 | Temp 98.2°F | Ht 63.25 in | Wt 184.0 lb

## 2011-10-17 DIAGNOSIS — Z23 Encounter for immunization: Secondary | ICD-10-CM

## 2011-10-17 DIAGNOSIS — R1310 Dysphagia, unspecified: Secondary | ICD-10-CM | POA: Insufficient documentation

## 2011-10-17 DIAGNOSIS — K449 Diaphragmatic hernia without obstruction or gangrene: Secondary | ICD-10-CM

## 2011-10-17 DIAGNOSIS — K219 Gastro-esophageal reflux disease without esophagitis: Secondary | ICD-10-CM | POA: Diagnosis not present

## 2011-10-17 MED ORDER — ESOMEPRAZOLE MAGNESIUM 40 MG PO CPDR: 40.0000 mg | DELAYED_RELEASE_CAPSULE | Freq: Two times a day (BID) | ORAL | Status: DC

## 2011-10-17 MED ORDER — SERTRALINE HCL 100 MG PO TABS: 100.0000 mg | ORAL_TABLET | Freq: Two times a day (BID) | ORAL | Status: DC

## 2011-10-17 NOTE — Progress Notes (Signed)
Subjective:    Patient ID: [name] [name], female    DOB: Jun 15, 1934, 77 y.o.   MRN: [id number]  HPI Here for swallowing problems and nausea   Having reflux and terrible heartburn ever since her knee repl Burping  Stomach growls all the time - like hunger pains  On nexium twice per day  When she swallows - difficult to get food all the way down - needs liquids    Had knee replacement 2 mo ago- went very well and is thrilled  Finally off all the narcotics No constipation or blood in stool or dark stool  Has lost 18 lb - ? If eating less due to her symptoms  She wanted to loose weight but this is concerning   Patient Active Problem List  Diagnosis  . HELICOBACTER PYLORI INFECTION  . UNSPECIFIED VITAMIN D DEFICIENCY  . HYPERLIPIDEMIA  . ANXIETY  . DEPRESSION  . MACULAR DEGENERATION  . CVA  . EXTERNAL HEMORRHOIDS  . GERD  . HIATAL HERNIA  . DIVERTICULOSIS OF COLON  . OSTEOARTHRITIS  . ARTHRITIS, GENERALIZED  . DEGENERATIVE DISC DISEASE  . OSTEOPENIA  . FATIGUE  . PALPITATIONS  . CEREBROVASCULAR ACCIDENT, HX OF  . GASTRITIS, HX OF  . OVERACTIVE BLADDER  . Gynecological examination  . Urinary incontinence  . Other dyspnea and respiratory abnormality  . Hemorrhoids  . OA (osteoarthritis) of knee  . Postop Hyponatremia  . Postoperative anemia due to acute blood loss  . Postop Transfusion   Past Medical History  Diagnosis Date  . Painful respiration   . Personal history of unspecified circulatory disease   . Palpitations   . Other and unspecified hyperlipidemia   . Esophageal reflux   . Depressive disorder, not elsewhere classified   . Anxiety state, unspecified   . Unspecified arthropathy, multiple sites   . Diaphragmatic hernia without mention of obstruction or gangrene   . External hemorrhoids without mention of complication   . Diverticulosis of colon (without mention of hemorrhage)   . Diarrhea   . Special screening for malignant neoplasms of other  sites   . Unspecified vitamin D deficiency   . Other malaise and fatigue 08-11-11    hx. "chronic fatigue syndrome"  . Disorder of bone and cartilage, unspecified   . Helicobacter pylori (H. pylori)   . Macular degeneration (senile) of retina, unspecified   . Shortness of breath 08-11-11    with exertion only at present  . Unspecified cerebral artery occlusion with cerebral infarction 08-11-11    '97/ '06( TIA)-affected lt. side, no residual  . Gallstones 08-11-11    asymptomatic at present  . Personal history of other diseases of digestive system 08-11-11    "Irittable bowel syndrome"  . Urinary incontinence 08-11-11    wears peripad daily  . Osteoarthrosis, unspecified whether generalized or localized, unspecified site 08-11-11    hx. "rhematoid arthritis", osteoarthritis, DDD, bursitis(hip)   Past Surgical History  Procedure Date  . Appendectomy   . Tubal ligation   . Knee surgery 08-11-11    rt. knee scope  . Cataract extraction 08-11-11    Bilateral  . Back surgery 08-11-11    '11-hx. lumbar fusion with retained hardware  . Abdominal hysterectomy 08-11-11  . Total knee arthroplasty 08/16/2011    Procedure: TOTAL KNEE ARTHROPLASTY;  Surgeon: Loanne Drilling, MD;  Location: [facility];  Service: Orthopedics;  Laterality: Right;   History  Substance Use Topics  . Smoking status: Never Smoker   .  Smokeless tobacco: Not on file  . Alcohol Use: No   Family History  Problem Relation Age of Onset  . Heart disease Father   . Diabetes Sister   . Coronary artery disease Brother   . Diabetes Brother   . Heart disease Brother   . Cancer Daughter     pancreatic cancer   Allergies  Allergen Reactions  . Alendronate Sodium     REACTION: JAW PAIN  . Celecoxib     REACTION: GI UPSET  . Latex   . Oxycodone-Acetaminophen     REACTION: TOO STRONG   Current Outpatient Prescriptions on File Prior to Visit  Medication Sig Dispense Refill  . acetaminophen (TYLENOL) 325 MG tablet Take 2  tablets (650 mg total) by mouth every 6 (six) hours as needed (or Fever >/= 101).  40 tablet  0  . ALPRAZolam (XANAX) 0.5 MG tablet Take 1 tablet (0.5 mg total) by mouth at bedtime as needed for anxiety.  30 tablet  2  . atorvastatin (LIPITOR) 10 MG tablet Take 10 mg by mouth every morning.      Marland Kitchen esomeprazole (NEXIUM) 40 MG capsule Take 1 capsule (40 mg total) by mouth 2 (two) times daily before a meal.  180 capsule  3  . polyethylene glycol (MIRALAX / GLYCOLAX) packet Take 17 g by mouth as needed.       . rivaroxaban (XARELTO) 10 MG TABS tablet Take 1 tablet (10 mg total) by mouth daily with breakfast. Take Xarelto for two and a half more weeks, then discontinue Xarelto. Once the patient has completed the Xarelto, they may resume Plavix 75 mg.  18 tablet  0  . sertraline (ZOLOFT) 100 MG tablet Take 1 tablet (100 mg total) by mouth 2 (two) times daily.  180 tablet  3  . sulfamethoxazole-trimethoprim (BACTRIM DS) 800-160 MG per tablet Take 1 tablet by mouth at bedtime.           Review of Systems Review of Systems  Constitutional: Negative for fever, appetite change, fatigue and pos for  unexpected weight change.  Eyes: Negative for pain and visual disturbance.  Respiratory: Negative for cough and shortness of breath.   Cardiovascular: Negative for cp or palpitations    Gastrointestinal: Negative for vomiting , blood in stool or dark stool Genitourinary: Negative for urgency and frequency.  Skin: Negative for pallor or rash   Neurological: Negative for weakness, light-headedness, numbness and headaches.  Hematological: Negative for adenopathy. Does not bruise/bleed easily.  Psychiatric/Behavioral: Negative for dysphoric mood. The patient is not nervous/anxious.         Objective:   Physical Exam  Constitutional: She appears well-developed and well-nourished. No distress.  HENT:  Head: Normocephalic and atraumatic.  Mouth/Throat: Oropharynx is clear and moist.  Eyes: Conjunctivae  normal and EOM are normal. Pupils are equal, round, and reactive to light. No scleral icterus.  Neck: Normal range of motion. Neck supple. No tracheal deviation present. No thyromegaly present.  Cardiovascular: Normal rate and regular rhythm.   Pulmonary/Chest: Effort normal and breath sounds normal.  Abdominal: Soft. Bowel sounds are normal. She exhibits no distension and no mass. There is tenderness. There is no rebound and no guarding.       Very mild epigastric tenderness No ruq tenderness or murphy sign  Musculoskeletal: She exhibits no edema and no tenderness.  Lymphadenopathy:    She has no cervical adenopathy.  Neurological: She is alert. She has normal reflexes.  Skin: Skin is warm and  dry. No pallor.  Psychiatric: She has a normal mood and affect.          Assessment & Plan:

## 2011-10-17 NOTE — Assessment & Plan Note (Signed)
Much worse after her knee repl Constant heartburn and now dysphagia and nausea  On bid nexium Ref to GI -may need EGD Bland diet Update if worse  Will keep in mind-pt also has hx of gallstones

## 2011-10-17 NOTE — Patient Instructions (Addendum)
Continue nexium  Eat bland diet  We will do GI referral at check out  [facility]  Flu shot today

## 2011-10-18 ENCOUNTER — Ambulatory Visit
Admission: RE | Admit: 2011-10-18 | Discharge: 2011-10-18 | Disposition: A | Discharge status: Home / Self Care | Payer: Medicare Other | Source: Ambulatory Visit | Attending: Neurosurgery | Admitting: Neurosurgery

## 2011-10-18 DIAGNOSIS — Z981 Arthrodesis status: Secondary | ICD-10-CM | POA: Diagnosis not present

## 2011-10-18 DIAGNOSIS — M5137 Other intervertebral disc degeneration, lumbosacral region: Secondary | ICD-10-CM | POA: Diagnosis not present

## 2011-10-18 DIAGNOSIS — M47817 Spondylosis without myelopathy or radiculopathy, lumbosacral region: Secondary | ICD-10-CM

## 2011-10-20 ENCOUNTER — Telehealth: Payer: Self-pay

## 2011-10-20 NOTE — Telephone Encounter (Signed)
New pt appt made pt aware and new pt paper work mailed

## 2011-10-20 NOTE — Telephone Encounter (Signed)
Message copied by Donata Duff on Thu Oct 20, 2011  8:44 AM ------      Message from: Hart Carwin      Created: Thu Oct 20, 2011  8:01 AM       It is OK to switch DB      ----- Message -----         From: Donata Duff, CMA         Sent: 10/20/2011   7:43 AM           To: Hart Carwin, MD            Dr Juanda Chance is this ok?      ----- Message -----         From: Rachael Fee, MD         Sent: 10/20/2011   7:25 AM           To: Donata Duff, CMA, Holli Humbles            Happy to see her as new patient, next available spot.  Thanks.            Please make sure this switch is OK with Dr. Juanda Chance as well.                              ----- Message -----         From: Holli Humbles         Sent: 10/19/2011   3:46 PM           To: Rachael Fee, MD                        ----- Message -----         From: Donata Duff, CMA         Sent: 10/19/2011   3:30 PM           To: Holli Humbles            Dr Christella Hartigan this is a friend of Cheryl's and she is a pt of Dr Juanda Chance but she wants to switch to you.  She has a lot of bloating, reflux, dysphagia (solids and liquids), weight loss (20 pounds in 3 months).  Will you accept?                        ----- Message -----         From: Holli Humbles         Sent: 10/19/2011   9:19 AM           To: Donata Duff, CMA            Pt said she spoke with Geraldine Contras yesterday and was told to call you today            # [phone]

## 2011-10-21 ENCOUNTER — Telehealth: Payer: Self-pay | Admitting: Gastroenterology

## 2011-10-21 NOTE — Telephone Encounter (Signed)
Message copied by Holli Humbles on Fri Oct 21, 2011  8:09 AM ------      Message from: Hart Carwin      Created: Thu Oct 20, 2011  5:18 PM       OK to switch DB      ----- Message -----         From: Holli Humbles         Sent: 10/20/2011  10:05 AM           To: Hart Carwin, MD                        ----- Message -----         From: Rachael Fee, MD         Sent: 10/20/2011   7:25 AM           To: Donata Duff, CMA, Holli Humbles            Happy to see her as new patient, next available spot.  Thanks.            Please make sure this switch is OK with Dr. Juanda Chance as well.                              ----- Message -----         From: Holli Humbles         Sent: 10/19/2011   3:46 PM           To: Rachael Fee, MD                        ----- Message -----         From: Donata Duff, CMA         Sent: 10/19/2011   3:30 PM           To: Holli Humbles            Dr Christella Hartigan this is a friend of Cheryl's and she is a pt of Dr Juanda Chance but she wants to switch to you.  She has a lot of bloating, reflux, dysphagia (solids and liquids), weight loss (20 pounds in 3 months).  Will you accept?                        ----- Message -----         From: Holli Humbles         Sent: 10/19/2011   9:19 AM           To: Donata Duff, CMA            Pt said she spoke with Geraldine Contras yesterday and was told to call you today            # [phone]

## 2011-11-02 ENCOUNTER — Other Ambulatory Visit: Payer: Self-pay | Admitting: Family Medicine

## 2011-11-08 DIAGNOSIS — M47817 Spondylosis without myelopathy or radiculopathy, lumbosacral region: Secondary | ICD-10-CM | POA: Diagnosis not present

## 2011-11-09 ENCOUNTER — Other Ambulatory Visit: Payer: Self-pay | Admitting: Neurosurgery

## 2011-11-09 DIAGNOSIS — M47816 Spondylosis without myelopathy or radiculopathy, lumbar region: Secondary | ICD-10-CM

## 2011-11-10 ENCOUNTER — Encounter: Payer: Self-pay | Admitting: Cardiovascular Disease

## 2011-11-14 ENCOUNTER — Other Ambulatory Visit: Payer: Self-pay | Admitting: Neurosurgery

## 2011-11-14 ENCOUNTER — Ambulatory Visit
Admission: RE | Admit: 2011-11-14 | Discharge: 2011-11-14 | Disposition: A | Discharge status: Home / Self Care | Payer: Medicare Other | Source: Ambulatory Visit | Attending: Neurosurgery | Admitting: Neurosurgery

## 2011-11-14 DIAGNOSIS — M545 Low back pain: Secondary | ICD-10-CM | POA: Diagnosis not present

## 2011-11-14 DIAGNOSIS — M47816 Spondylosis without myelopathy or radiculopathy, lumbar region: Secondary | ICD-10-CM

## 2011-11-14 DIAGNOSIS — M47817 Spondylosis without myelopathy or radiculopathy, lumbosacral region: Secondary | ICD-10-CM | POA: Diagnosis not present

## 2011-11-14 MED ORDER — METHYLPREDNISOLONE ACETATE 40 MG/ML INJ SUSP (RADIOLOG
120.0000 mg | Freq: Once | INTRAMUSCULAR | Status: AC
  Administered 2011-11-14: 120 mg via INTRA_ARTICULAR

## 2011-11-14 MED ORDER — IOHEXOL 180 MG/ML  SOLN
1.0000 mL | Freq: Once | INTRAMUSCULAR | Status: AC | PRN
  Administered 2011-11-14: 1 mL via INTRA_ARTICULAR

## 2011-11-16 ENCOUNTER — Ambulatory Visit (INDEPENDENT_AMBULATORY_CARE_PROVIDER_SITE_OTHER): Payer: Medicare Other | Admitting: Gastroenterology

## 2011-11-16 ENCOUNTER — Encounter: Payer: Self-pay | Admitting: Gastroenterology

## 2011-11-16 VITALS — BP 120/60 | HR 66 | Ht 63.5 in | Wt 183.2 lb

## 2011-11-16 DIAGNOSIS — R1310 Dysphagia, unspecified: Secondary | ICD-10-CM | POA: Diagnosis not present

## 2011-11-16 NOTE — Progress Notes (Signed)
Review of pertinent gastrointestinal problems: 1. Routine risk for colon cancer.  Colonoscopy [facility] 2010 for  "INDICATIONS: unexplained diarrhea, weight loss last colon 2003, gallstones on the ultrasound fecal urgency and incontinence on Plavix" found diverticulosis, HP polyps, random biopsies showed no microscopic colitis. Was recommended for recall colonoscopy at 10 year interval. 2. Hiatal hernia, 3cm : noted on EGD Dr. Corinda Gubler 2003 as well as "early stricture" not dilated.  HPI: This is a  very pleasant 77 year old woman whom I am meeting for the first time today.   Has been haivng swallowing trouble at least for 6-8 months ago. This has been getting worse.  Solid food, pills only. Liquids are fine.   Overall she has lost 22 pounds in past 6 months.  Unintentionally. She gets pyrosis. Takes nexium twice daily. As long as she takes the BID PPI she feels well.  Non smoker, no etoh, stopped caffeine long ago.   Review of systems: Pertinent positive and negative review of systems were noted in the above HPI section. Complete review of systems was performed and was otherwise normal.    Past Medical History  Diagnosis Date  . Painful respiration   . Personal history of unspecified circulatory disease   . Palpitations   . Other and unspecified hyperlipidemia   . Esophageal reflux   . Depressive disorder, not elsewhere classified   . Anxiety state, unspecified   . Unspecified arthropathy, multiple sites   . Diaphragmatic hernia without mention of obstruction or gangrene   . External hemorrhoids without mention of complication   . Diverticulosis of colon (without mention of hemorrhage)   . Diarrhea   . Special screening for malignant neoplasms of other sites   . Unspecified vitamin D deficiency   . Other malaise and fatigue 08-11-11    hx. "chronic fatigue syndrome"  . Disorder of bone and cartilage, unspecified   . Helicobacter pylori (H. pylori)   . Macular degeneration (senile) of  retina, unspecified   . Shortness of breath 08-11-11    with exertion only at present  . Unspecified cerebral artery occlusion with cerebral infarction 08-11-11    '97/ '06( TIA)-affected lt. side, no residual  . Gallstones 08-11-11    asymptomatic at present  . Personal history of other diseases of digestive system 08-11-11    "Irittable bowel syndrome"  . Urinary incontinence 08-11-11    wears peripad daily  . Osteoarthrosis, unspecified whether generalized or localized, unspecified site 08-11-11    hx. "rhematoid arthritis", osteoarthritis, DDD, bursitis(hip)  . Lumbar spondylosis     Past Surgical History  Procedure Date  . Appendectomy   . Tubal ligation   . Knee surgery 08-11-11    rt. knee scope  . Cataract extraction 08-11-11    Bilateral  . Back surgery 08-11-11    '11-hx. lumbar fusion with retained hardware  . Abdominal hysterectomy 08-11-11  . Total knee arthroplasty 08/16/2011    Procedure: TOTAL KNEE ARTHROPLASTY;  Surgeon: Loanne Drilling, MD;  Location: [facility];  Service: Orthopedics;  Laterality: Right;    Current Outpatient Prescriptions  Medication Sig Dispense Refill  . acetaminophen (TYLENOL) 325 MG tablet Take 2 tablets (650 mg total) by mouth every 6 (six) hours as needed (or Fever >/= 101).  40 tablet  0  . ALPRAZolam (XANAX) 0.5 MG tablet Take 0.5 mg by mouth at bedtime.      . ALPRAZolam (XANAX) 0.5 MG tablet Take 0.5 mg by mouth 2 (two) times daily.      Marland Kitchen  atorvastatin (LIPITOR) 10 MG tablet TAKE 1 TABLET BY MOUTH DAILY  90 tablet  1  . clopidogrel (PLAVIX) 75 MG tablet Take 75 mg by mouth daily.      Marland Kitchen esomeprazole (NEXIUM) 40 MG capsule Take 1 capsule (40 mg total) by mouth 2 (two) times daily before a meal.  180 capsule  3  . polyethylene glycol (MIRALAX / GLYCOLAX) packet Take 17 g by mouth as needed.       . sertraline (ZOLOFT) 100 MG tablet Take 1 tablet (100 mg total) by mouth 2 (two) times daily.  180 tablet  3  . sulfamethoxazole-trimethoprim (BACTRIM  DS) 800-160 MG per tablet Take 1 tablet by mouth at bedtime.         Allergies as of 11/16/2011 - Review Complete 11/16/2011  Allergen Reaction Noted  . Alendronate sodium  03/14/2006  . Celecoxib  03/14/2006  . Latex  12/22/2008  . Oxycodone-acetaminophen  03/14/2006    Family History  Problem Relation Age of Onset  . Heart disease Father   . Diabetes Sister   . Coronary artery disease Brother   . Diabetes Brother   . Heart disease Brother   . Pancreatic cancer Daughter   . Colon cancer Neg Hx     History   Social History  . Marital Status: Legally Separated    Spouse Name: N/A    Number of Children: 3  . Years of Education: N/A   Occupational History  . disabled    Social History Main Topics  . Smoking status: Never Smoker   . Smokeless tobacco: Never Used  . Alcohol Use: No  . Drug Use: No  . Sexually Active: Not on file   Other Topics Concern  . Not on file   Social History Narrative  . No narrative on file       Physical Exam: BP 120/60  Pulse 66  Ht 5' 3.5" (1.613 m)  Wt 183 lb 3.2 oz (83.099 kg)  BMI 31.94 kg/m2 Constitutional: generally well-appearing Psychiatric: alert and oriented x3 Eyes: extraocular movements intact Mouth: oral pharynx moist, no lesions Neck: supple no lymphadenopathy Cardiovascular: heart regular rate and rhythm Lungs: clear to auscultation bilaterally Abdomen: soft, nontender, nondistended, no obvious ascites, no peritoneal signs, normal bowel sounds Extremities: no lower extremity edema bilaterally Skin: no lesions on visible extremities    Assessment and plan: 77 y.o. female with  chronic GERD, recent progressive dysphagia, recent weight loss  She is on Plavix which is a blood thinner that increases her risk for procedural complications especially for dilation is performed. We will communicate with her primary care physician about holding the Plavix prior to the test. Patient tells me she has held numerous times  for various orthopedic issues in the past 2-3 years and has not had a problem. She will continue to chew her food well eat slowly and take small bites. My suspicion for neoplasm is elevated given her weight loss and her progressive symptoms.

## 2011-11-16 NOTE — Patient Instructions (Addendum)
You will be set up for an upper endoscopy with likely dilation for dysphagia, history of "early stricture" on 2003 EGD by Dr. Corinda Gubler, weight loss. We will contact Dr. Milinda Antis about holding your plavix for 5 days prior to your EGD. This was started for CVA about 3 years and was held for orthopedic procedures. In the meantime chew your food well, eat slowly, take small bites. Continue twice daily nexium as well.  You have been scheduled for an endoscopy . Please follow written instructions given to you at your visit today. If you use inhalers (even only as needed) or a CPAP machine, please bring them with you on the day of your procedure.

## 2011-11-18 ENCOUNTER — Other Ambulatory Visit: Payer: Self-pay | Admitting: Family Medicine

## 2011-11-18 ENCOUNTER — Telehealth: Payer: Self-pay | Admitting: Family Medicine

## 2011-11-18 ENCOUNTER — Telehealth: Payer: Self-pay

## 2011-11-18 NOTE — Telephone Encounter (Signed)
Dr Milinda Antis called and asked about Plavix letter [phone] left message for return call  Dr Milinda Antis is out of the office until Monday

## 2011-11-18 NOTE — Telephone Encounter (Signed)
As far as i can tell, remote h/o CVA (latest TIA 2006) Only on plavix. Ok to hold plavix for 5 days prior to procedure.  Restart that night if able.

## 2011-11-18 NOTE — Telephone Encounter (Signed)
Message copied by Donata Duff on Fri Nov 18, 2011  8:23 AM ------      Message from: Donata Duff      Created: Wed Nov 16, 2011 10:41 AM       Waiting on anti coag

## 2011-11-18 NOTE — Telephone Encounter (Signed)
[name] [name] with Dr Larae Grooms office needs OK to hold Plavix prior to procedure 11/23/11. [name] [name] sent letter thru epic but understands Dr Milinda Antis out of office until [place] and needs response today.Please advise.

## 2011-11-18 NOTE — Telephone Encounter (Signed)
Order faxed to [facility].

## 2011-11-18 NOTE — Telephone Encounter (Signed)
Pt has been notified that Dr Oren Section has okd the pt to hold Plavix 5 days prior to the procedure, response to be scanned into EPIC

## 2011-11-23 ENCOUNTER — Encounter: Payer: Self-pay | Admitting: Gastroenterology

## 2011-11-23 ENCOUNTER — Ambulatory Visit (AMBULATORY_SURGERY_CENTER): Payer: Medicare Other | Admitting: Gastroenterology

## 2011-11-23 VITALS — BP 98/50 | HR 57 | Temp 98.0°F | Resp 14 | Ht 63.5 in | Wt 183.0 lb

## 2011-11-23 DIAGNOSIS — R1310 Dysphagia, unspecified: Secondary | ICD-10-CM | POA: Diagnosis not present

## 2011-11-23 DIAGNOSIS — K222 Esophageal obstruction: Secondary | ICD-10-CM

## 2011-11-23 DIAGNOSIS — Q393 Congenital stenosis and stricture of esophagus: Secondary | ICD-10-CM | POA: Diagnosis not present

## 2011-11-23 DIAGNOSIS — Q391 Atresia of esophagus with tracheo-esophageal fistula: Secondary | ICD-10-CM | POA: Diagnosis not present

## 2011-11-23 DIAGNOSIS — K449 Diaphragmatic hernia without obstruction or gangrene: Secondary | ICD-10-CM

## 2011-11-23 MED ORDER — SODIUM CHLORIDE 0.9 % IV SOLN: 500.0000 mL | INTRAVENOUS | Status: DC

## 2011-11-23 NOTE — Progress Notes (Signed)
Per Dr. Christella Hartigan, pt is ok to restart her Plavix today  Patient did not experience any of the following events: a burn prior to discharge; a fall within the facility; wrong site/side/patient/procedure/implant event; or a hospital transfer or hospital admission upon discharge from the facility. [phone]) Patient did not have preoperative order for IV antibiotic SSI prophylaxis. [phone])

## 2011-11-23 NOTE — Op Note (Signed)
[facility] [address] [place], [address]   ENDOSCOPY PROCEDURE REPORT  PATIENT: [name] [name], [name] [name]  MR#: [id number] BIRTHDATE: [birth year] , 76  yrs. old GENDER: Female ENDOSCOPIST: Rachael Fee, MD PROCEDURE DATE:  11/23/2011 PROCEDURE:  balloon dilation of esophagus ASA CLASS:     Class III INDICATIONS:  dysphagia, weight loss. MEDICATIONS: Fentanyl 75 mcg IV, Versed 6 mg IV, and These medications were titrated to patient response per physician's verbal order TOPICAL ANESTHETIC: Cetacaine Spray  DESCRIPTION OF PROCEDURE: After the risks benefits and alternatives of the procedure were thoroughly explained, informed consent was obtained.  The LB [id number]-H180 [id number] endoscope was introduced through the mouth and advanced to the second portion of the duodenum. Without limitations.  The instrument was slowly withdrawn as the mucosa was fully examined.  There was a 4-5cm hiatal hernia creating a foreshortened and distally tortuous esophagus.  There was a thin Schatzki's ring at GE junction.  This was dilated with 20mm CRE TTS balloon held inflated for 1 minute.  The examination was otherwise normal. Retroflexed views revealed no abnormalities.     The scope was then withdrawn from the patient and the procedure completed. COMPLICATIONS: There were no complications.  ENDOSCOPIC IMPRESSION: There was a 4-5cm hiatal hernia creating a foreshortened and distally tortuous esophagus. There was a thin Schatzki's ring at GE junction, dilated today. The examination was otherwise normal.  RECOMMENDATIONS: Dr.  Christella Hartigan' office will contact you abour return visit in 4-5 weeks to see how this dilation has effected your swallowing.    eSigned:  Rachael Fee, MD 11/23/2011 9:30 AM   AO:ZHYQM Fransisca Connors, MD

## 2011-11-23 NOTE — Progress Notes (Signed)
The pt's esophagus was dilated with 18,19,20 balloon.  Per Dr. Christella Hartigan go up to 20 which was 6 ATM

## 2011-11-23 NOTE — Progress Notes (Signed)
Held at 6 ATM, 20 for 1 min and the balloon was deflated.  The pt tolerated the dilatation very well. Maw

## 2011-11-23 NOTE — Patient Instructions (Addendum)
YOU HAD AN ENDOSCOPIC PROCEDURE TODAY AT THE [facility]: Refer to the procedure report that was given to you for any specific questions about what was found during the examination.  If the procedure report does not answer your questions, please call your gastroenterologist to clarify.  If you requested that your care partner not be given the details of your procedure findings, then the procedure report has been included in a sealed envelope for you to review at your convenience later.  YOU SHOULD EXPECT: Some feelings of bloating in the abdomen. Passage of more gas than usual.  Walking can help get rid of the air that was put into your GI tract during the procedure and reduce the bloating. If you had a lower endoscopy (such as a colonoscopy or flexible sigmoidoscopy) you may notice spotting of blood in your stool or on the toilet paper. If you underwent a bowel prep for your procedure, then you may not have a normal bowel movement for a few days.  DIET: FOLLOW DILATION DIET- SEE HANDOUT.  Drink plenty of fluids but you should avoid alcoholic beverages for 24 hours.  ACTIVITY: Your care partner should take you home directly after the procedure.  You should plan to take it easy, moving slowly for the rest of the day.  You can resume normal activity the day after the procedure however you should NOT DRIVE or use heavy machinery for 24 hours (because of the sedation medicines used during the test).    SYMPTOMS TO REPORT IMMEDIATELY: A gastroenterologist can be reached at any hour.  During normal business hours, 8:30 AM to 5:00 PM Monday through Friday, call [phone].  After hours and on weekends, please call the GI answering service at [phone] who will take a message and have the physician on call contact you.   Following upper endoscopy (EGD)  Vomiting of blood or coffee ground material  New chest pain or pain under the shoulder blades  Painful or persistently difficult  swallowing  New shortness of breath  Fever of 100F or higher  Black, tarry-looking stools  FOLLOW UP: If any biopsies were taken you will be contacted by phone or by letter within the next 1-3 weeks.  Call your gastroenterologist if you have not heard about the biopsies in 3 weeks.  Our staff will call the home number listed on your records the next business day following your procedure to check on you and address any questions or concerns that you may have at that time regarding the information given to you following your procedure. This is a courtesy call and so if there is no answer at the home number and we have not heard from you through the emergency physician on call, we will assume that you have returned to your regular daily activities without incident.  SIGNATURES/CONFIDENTIALITY: You and/or your care partner have signed paperwork which will be entered into your electronic medical record.  These signatures attest to the fact that that the information above on your After Visit Summary has been reviewed and is understood.  Full responsibility of the confidentiality of this discharge information lies with you and/or your care-partner.   OK TO RESTART PLAVIX TODAY  FOLLOW DILATION DIET  DR. Christella Hartigan OFFICE WILL CONTACT YOU TO SET UP A RETURN OFFICE VISIT IN 4-5 WEEKS TO CHECK YOUR SWALLOWING

## 2011-11-23 NOTE — Progress Notes (Signed)
Per-sedation the pt said she took her last dose of plavix 11/17/11. Maw

## 2011-11-24 ENCOUNTER — Telehealth: Payer: Self-pay

## 2011-11-24 NOTE — Telephone Encounter (Signed)
  Follow up Call-  Call back number 11/23/2011  Post procedure Call Back phone  # [phone]  Permission to leave phone message Yes     Patient questions:  Do you have a fever, pain , or abdominal swelling? no Pain Score  0 *  Have you tolerated food without any problems? yes  Have you been able to return to your normal activities? yes  Do you have any questions about your discharge instructions: Diet   no Medications  no Follow up visit  no  Do you have questions or concerns about your Care? no  Actions: * If pain score is 4 or above: No action needed, pain <4.   Per the pt she did have reflux sx yesterday.  Maw

## 2011-12-05 DIAGNOSIS — D3130 Benign neoplasm of unspecified choroid: Secondary | ICD-10-CM | POA: Diagnosis not present

## 2011-12-08 DIAGNOSIS — M47817 Spondylosis without myelopathy or radiculopathy, lumbosacral region: Secondary | ICD-10-CM | POA: Diagnosis not present

## 2011-12-21 ENCOUNTER — Ambulatory Visit: Payer: Medicare Other | Admitting: Internal Medicine

## 2011-12-28 ENCOUNTER — Ambulatory Visit: Payer: Medicare Other | Admitting: Gastroenterology

## 2012-01-06 ENCOUNTER — Other Ambulatory Visit: Payer: Self-pay | Admitting: *Deleted

## 2012-01-06 MED ORDER — ALPRAZOLAM 0.5 MG PO TABS: 0.5000 mg | ORAL_TABLET | Freq: Every day | ORAL | Status: DC

## 2012-01-06 NOTE — Telephone Encounter (Signed)
Px written for call in   

## 2012-01-06 NOTE — Telephone Encounter (Signed)
Rx called in as prescribed 

## 2012-01-27 DIAGNOSIS — F329 Major depressive disorder, single episode, unspecified: Secondary | ICD-10-CM | POA: Diagnosis present

## 2012-01-27 DIAGNOSIS — Z91040 Latex allergy status: Secondary | ICD-10-CM | POA: Diagnosis not present

## 2012-01-27 DIAGNOSIS — IMO0002 Reserved for concepts with insufficient information to code with codable children: Secondary | ICD-10-CM | POA: Diagnosis not present

## 2012-01-27 DIAGNOSIS — Z981 Arthrodesis status: Secondary | ICD-10-CM | POA: Diagnosis not present

## 2012-01-27 DIAGNOSIS — M47817 Spondylosis without myelopathy or radiculopathy, lumbosacral region: Secondary | ICD-10-CM | POA: Diagnosis not present

## 2012-01-27 DIAGNOSIS — K219 Gastro-esophageal reflux disease without esophagitis: Secondary | ICD-10-CM | POA: Diagnosis not present

## 2012-01-27 DIAGNOSIS — F3289 Other specified depressive episodes: Secondary | ICD-10-CM | POA: Diagnosis not present

## 2012-01-27 DIAGNOSIS — Z8673 Personal history of transient ischemic attack (TIA), and cerebral infarction without residual deficits: Secondary | ICD-10-CM | POA: Diagnosis not present

## 2012-01-27 DIAGNOSIS — M4310 Spondylolisthesis, site unspecified: Secondary | ICD-10-CM | POA: Diagnosis not present

## 2012-01-27 DIAGNOSIS — I10 Essential (primary) hypertension: Secondary | ICD-10-CM | POA: Diagnosis not present

## 2012-01-27 DIAGNOSIS — Z79899 Other long term (current) drug therapy: Secondary | ICD-10-CM | POA: Diagnosis not present

## 2012-01-27 DIAGNOSIS — Z7902 Long term (current) use of antithrombotics/antiplatelets: Secondary | ICD-10-CM | POA: Diagnosis not present

## 2012-01-30 DIAGNOSIS — I10 Essential (primary) hypertension: Secondary | ICD-10-CM | POA: Diagnosis not present

## 2012-01-30 DIAGNOSIS — K219 Gastro-esophageal reflux disease without esophagitis: Secondary | ICD-10-CM | POA: Diagnosis not present

## 2012-01-30 DIAGNOSIS — Z981 Arthrodesis status: Secondary | ICD-10-CM | POA: Diagnosis not present

## 2012-01-30 DIAGNOSIS — Z8673 Personal history of transient ischemic attack (TIA), and cerebral infarction without residual deficits: Secondary | ICD-10-CM | POA: Diagnosis not present

## 2012-01-30 DIAGNOSIS — Z7902 Long term (current) use of antithrombotics/antiplatelets: Secondary | ICD-10-CM | POA: Diagnosis not present

## 2012-01-30 DIAGNOSIS — M4310 Spondylolisthesis, site unspecified: Secondary | ICD-10-CM | POA: Diagnosis not present

## 2012-01-30 DIAGNOSIS — IMO0002 Reserved for concepts with insufficient information to code with codable children: Secondary | ICD-10-CM | POA: Diagnosis not present

## 2012-01-30 DIAGNOSIS — F329 Major depressive disorder, single episode, unspecified: Secondary | ICD-10-CM | POA: Diagnosis not present

## 2012-01-30 DIAGNOSIS — M47817 Spondylosis without myelopathy or radiculopathy, lumbosacral region: Secondary | ICD-10-CM | POA: Diagnosis not present

## 2012-02-09 ENCOUNTER — Telehealth: Payer: Self-pay | Admitting: Family Medicine

## 2012-02-09 ENCOUNTER — Encounter: Payer: Self-pay | Admitting: Family Medicine

## 2012-02-09 DIAGNOSIS — Z981 Arthrodesis status: Secondary | ICD-10-CM | POA: Diagnosis not present

## 2012-02-09 DIAGNOSIS — M899 Disorder of bone, unspecified: Secondary | ICD-10-CM

## 2012-02-09 DIAGNOSIS — M47817 Spondylosis without myelopathy or radiculopathy, lumbosacral region: Secondary | ICD-10-CM | POA: Diagnosis not present

## 2012-02-09 NOTE — Telephone Encounter (Signed)
I do not see result in epic- please look in centrictiy and if she needs it send copy to the doctor she listed  Thanks

## 2012-02-09 NOTE — Telephone Encounter (Signed)
Caller: [name] [name]/Patient; Phone: [phone]; Reason for Call: Patient needs to know when her last Bone Density Test was performed.  She states Dr.  Trey Sailors [facility], [address] , [place] .  Phone- [phone] needs information prior to setting up Reclasts infusions.  Please contact patient with information-  [phone]

## 2012-02-14 NOTE — Telephone Encounter (Signed)
Last DEXA scan was 10/07/09 (in [facility]), I faxed a copy to pt's doctor's office but pt said that since it's been over 2 years Dr. Channing Mutters said she needs another one done, please put referral in for DEXA scan

## 2012-02-14 NOTE — Telephone Encounter (Signed)
She had it done at [facility] in [place], but pt just said she need to go somewhere in [place]

## 2012-02-14 NOTE — Telephone Encounter (Signed)
Please ask where she had her last one and let me know so I can refer, thanks

## 2012-02-14 NOTE — Telephone Encounter (Signed)
Ref done for dexa  

## 2012-02-20 ENCOUNTER — Ambulatory Visit (INDEPENDENT_AMBULATORY_CARE_PROVIDER_SITE_OTHER)
Admission: RE | Admit: 2012-02-20 | Discharge: 2012-02-20 | Disposition: A | Discharge status: Home / Self Care | Payer: Medicare Other | Source: Ambulatory Visit

## 2012-02-20 DIAGNOSIS — M899 Disorder of bone, unspecified: Secondary | ICD-10-CM

## 2012-02-20 DIAGNOSIS — M949 Disorder of cartilage, unspecified: Secondary | ICD-10-CM | POA: Diagnosis not present

## 2012-03-05 ENCOUNTER — Encounter: Payer: Self-pay | Admitting: *Deleted

## 2012-03-10 ENCOUNTER — Other Ambulatory Visit: Payer: Self-pay | Admitting: Family Medicine

## 2012-03-12 DIAGNOSIS — N952 Postmenopausal atrophic vaginitis: Secondary | ICD-10-CM | POA: Diagnosis not present

## 2012-03-12 DIAGNOSIS — Z87440 Personal history of urinary (tract) infections: Secondary | ICD-10-CM | POA: Diagnosis not present

## 2012-03-12 DIAGNOSIS — N898 Other specified noninflammatory disorders of vagina: Secondary | ICD-10-CM | POA: Diagnosis not present

## 2012-03-14 ENCOUNTER — Other Ambulatory Visit: Payer: Self-pay | Admitting: Family Medicine

## 2012-03-20 ENCOUNTER — Other Ambulatory Visit: Payer: Self-pay | Admitting: Family Medicine

## 2012-03-20 NOTE — Telephone Encounter (Signed)
Rx called in as prescribed 

## 2012-03-20 NOTE — Telephone Encounter (Signed)
Ok to refill 

## 2012-03-20 NOTE — Telephone Encounter (Signed)
Px written for call in   

## 2012-04-11 DIAGNOSIS — M47817 Spondylosis without myelopathy or radiculopathy, lumbosacral region: Secondary | ICD-10-CM | POA: Diagnosis not present

## 2012-04-16 DIAGNOSIS — M47817 Spondylosis without myelopathy or radiculopathy, lumbosacral region: Secondary | ICD-10-CM | POA: Diagnosis not present

## 2012-04-18 ENCOUNTER — Other Ambulatory Visit: Payer: Self-pay | Admitting: Family Medicine

## 2012-04-18 NOTE — Telephone Encounter (Signed)
She can have 6 mo of refils-thanks

## 2012-04-18 NOTE — Telephone Encounter (Signed)
done

## 2012-04-18 NOTE — Telephone Encounter (Signed)
Ok to refill 

## 2012-04-19 DIAGNOSIS — M47817 Spondylosis without myelopathy or radiculopathy, lumbosacral region: Secondary | ICD-10-CM | POA: Diagnosis not present

## 2012-04-24 DIAGNOSIS — M47817 Spondylosis without myelopathy or radiculopathy, lumbosacral region: Secondary | ICD-10-CM | POA: Diagnosis not present

## 2012-04-26 DIAGNOSIS — M47817 Spondylosis without myelopathy or radiculopathy, lumbosacral region: Secondary | ICD-10-CM | POA: Diagnosis not present

## 2012-05-01 DIAGNOSIS — M47817 Spondylosis without myelopathy or radiculopathy, lumbosacral region: Secondary | ICD-10-CM | POA: Diagnosis not present

## 2012-05-03 DIAGNOSIS — M47817 Spondylosis without myelopathy or radiculopathy, lumbosacral region: Secondary | ICD-10-CM | POA: Diagnosis not present

## 2012-05-08 DIAGNOSIS — M47817 Spondylosis without myelopathy or radiculopathy, lumbosacral region: Secondary | ICD-10-CM | POA: Diagnosis not present

## 2012-05-09 ENCOUNTER — Other Ambulatory Visit: Payer: Self-pay | Admitting: Family Medicine

## 2012-05-09 NOTE — Telephone Encounter (Signed)
Ok to refill? No recent appt and no future appt 

## 2012-05-09 NOTE — Telephone Encounter (Signed)
Please schedule f/u in sept and refil until then, thanks

## 2012-05-10 DIAGNOSIS — M47817 Spondylosis without myelopathy or radiculopathy, lumbosacral region: Secondary | ICD-10-CM | POA: Diagnosis not present

## 2012-05-15 DIAGNOSIS — M47817 Spondylosis without myelopathy or radiculopathy, lumbosacral region: Secondary | ICD-10-CM | POA: Diagnosis not present

## 2012-05-16 ENCOUNTER — Other Ambulatory Visit: Payer: Self-pay | Admitting: Family Medicine

## 2012-05-16 MED ORDER — ALPRAZOLAM 0.5 MG PO TABS: 0.5000 mg | ORAL_TABLET | Freq: Every evening | ORAL | Status: DC | PRN

## 2012-05-16 NOTE — Telephone Encounter (Signed)
That is ok - cancel the local px please Px written for call in

## 2012-05-16 NOTE — Telephone Encounter (Signed)
Received fax refill request for medication, you just refilled xanax in march to local pharmacy, pt wants [place] to cancel that Rx and [facility], I advise if meds get lost in mail and she doesn't get it we can't refill it early because it's a controlled med, pt is okay with that and wants Rx sent to mail order pharm, please advise

## 2012-05-17 NOTE — Telephone Encounter (Signed)
Called and canceled Rx for xanax at local pharmacy and called in Rx as prescribed to mail order pharmacy

## 2012-05-21 DIAGNOSIS — M47817 Spondylosis without myelopathy or radiculopathy, lumbosacral region: Secondary | ICD-10-CM | POA: Diagnosis not present

## 2012-05-21 DIAGNOSIS — M810 Age-related osteoporosis without current pathological fracture: Secondary | ICD-10-CM | POA: Diagnosis not present

## 2012-05-22 DIAGNOSIS — M47817 Spondylosis without myelopathy or radiculopathy, lumbosacral region: Secondary | ICD-10-CM | POA: Diagnosis not present

## 2012-05-29 DIAGNOSIS — M47817 Spondylosis without myelopathy or radiculopathy, lumbosacral region: Secondary | ICD-10-CM | POA: Diagnosis not present

## 2012-05-30 DIAGNOSIS — M47817 Spondylosis without myelopathy or radiculopathy, lumbosacral region: Secondary | ICD-10-CM | POA: Diagnosis not present

## 2012-06-13 ENCOUNTER — Other Ambulatory Visit: Payer: Self-pay

## 2012-06-13 DIAGNOSIS — Z1231 Encounter for screening mammogram for malignant neoplasm of breast: Secondary | ICD-10-CM

## 2012-06-14 DIAGNOSIS — M47817 Spondylosis without myelopathy or radiculopathy, lumbosacral region: Secondary | ICD-10-CM | POA: Diagnosis not present

## 2012-06-19 ENCOUNTER — Ambulatory Visit
Admission: RE | Admit: 2012-06-19 | Discharge: 2012-06-19 | Disposition: A | Discharge status: Home / Self Care | Payer: Medicare Other | Source: Ambulatory Visit

## 2012-06-19 DIAGNOSIS — M47817 Spondylosis without myelopathy or radiculopathy, lumbosacral region: Secondary | ICD-10-CM | POA: Diagnosis not present

## 2012-06-19 DIAGNOSIS — Z1231 Encounter for screening mammogram for malignant neoplasm of breast: Secondary | ICD-10-CM

## 2012-06-20 ENCOUNTER — Encounter: Payer: Self-pay | Admitting: *Deleted

## 2012-07-08 ENCOUNTER — Other Ambulatory Visit: Payer: Self-pay | Admitting: Family Medicine

## 2012-07-14 ENCOUNTER — Encounter (HOSPITAL_COMMUNITY): Payer: Self-pay | Admitting: Physical Medicine and Rehabilitation

## 2012-07-14 ENCOUNTER — Emergency Department (HOSPITAL_COMMUNITY): Payer: Medicare Other

## 2012-07-14 ENCOUNTER — Emergency Department (HOSPITAL_COMMUNITY)
Admission: EM | Admit: 2012-07-14 | Discharge: 2012-07-14 | Disposition: A | Discharge status: Home / Self Care | Payer: Medicare Other | Attending: Emergency Medicine | Admitting: Emergency Medicine

## 2012-07-14 DIAGNOSIS — Z8669 Personal history of other diseases of the nervous system and sense organs: Secondary | ICD-10-CM | POA: Insufficient documentation

## 2012-07-14 DIAGNOSIS — IMO0002 Reserved for concepts with insufficient information to code with codable children: Secondary | ICD-10-CM | POA: Diagnosis not present

## 2012-07-14 DIAGNOSIS — Y9389 Activity, other specified: Secondary | ICD-10-CM | POA: Insufficient documentation

## 2012-07-14 DIAGNOSIS — Z862 Personal history of diseases of the blood and blood-forming organs and certain disorders involving the immune mechanism: Secondary | ICD-10-CM | POA: Insufficient documentation

## 2012-07-14 DIAGNOSIS — K219 Gastro-esophageal reflux disease without esophagitis: Secondary | ICD-10-CM | POA: Diagnosis not present

## 2012-07-14 DIAGNOSIS — Z8619 Personal history of other infectious and parasitic diseases: Secondary | ICD-10-CM | POA: Insufficient documentation

## 2012-07-14 DIAGNOSIS — Z7902 Long term (current) use of antithrombotics/antiplatelets: Secondary | ICD-10-CM | POA: Diagnosis not present

## 2012-07-14 DIAGNOSIS — M549 Dorsalgia, unspecified: Secondary | ICD-10-CM

## 2012-07-14 DIAGNOSIS — Z8739 Personal history of other diseases of the musculoskeletal system and connective tissue: Secondary | ICD-10-CM | POA: Insufficient documentation

## 2012-07-14 DIAGNOSIS — M545 Low back pain: Secondary | ICD-10-CM | POA: Diagnosis not present

## 2012-07-14 DIAGNOSIS — Z8679 Personal history of other diseases of the circulatory system: Secondary | ICD-10-CM | POA: Diagnosis not present

## 2012-07-14 DIAGNOSIS — E785 Hyperlipidemia, unspecified: Secondary | ICD-10-CM | POA: Insufficient documentation

## 2012-07-14 DIAGNOSIS — Z79899 Other long term (current) drug therapy: Secondary | ICD-10-CM | POA: Insufficient documentation

## 2012-07-14 DIAGNOSIS — Y92410 Unspecified street and highway as the place of occurrence of the external cause: Secondary | ICD-10-CM | POA: Insufficient documentation

## 2012-07-14 DIAGNOSIS — F329 Major depressive disorder, single episode, unspecified: Secondary | ICD-10-CM | POA: Diagnosis not present

## 2012-07-14 DIAGNOSIS — F411 Generalized anxiety disorder: Secondary | ICD-10-CM | POA: Insufficient documentation

## 2012-07-14 DIAGNOSIS — F3289 Other specified depressive episodes: Secondary | ICD-10-CM | POA: Insufficient documentation

## 2012-07-14 DIAGNOSIS — Z8719 Personal history of other diseases of the digestive system: Secondary | ICD-10-CM | POA: Diagnosis not present

## 2012-07-14 MED ORDER — CYCLOBENZAPRINE HCL 10 MG PO TABS: 10.0000 mg | ORAL_TABLET | Freq: Two times a day (BID) | ORAL | Status: DC | PRN

## 2012-07-14 MED ORDER — HYDROCODONE-ACETAMINOPHEN 5-325 MG PO TABS: ORAL_TABLET | ORAL | Status: DC
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
## 2012-07-14 NOTE — ED Provider Notes (Signed)
Medical screening examination/treatment/procedure(s) were performed by non-physician practitioner and as supervising physician I was immediately available for consultation/collaboration. Jennene Downie, MD, FACEP   Madalin Hughart L Kammie Scioli, MD 07/14/12 1528 
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
## 2012-08-16 ENCOUNTER — Encounter: Payer: Self-pay | Admitting: Cardiovascular Disease

## 2012-08-16 ENCOUNTER — Ambulatory Visit (INDEPENDENT_AMBULATORY_CARE_PROVIDER_SITE_OTHER): Payer: TRICARE For Life (TFL) | Admitting: Cardiovascular Disease

## 2012-08-16 VITALS — BP 118/72 | HR 64 | Ht 63.5 in | Wt 186.1 lb

## 2012-08-16 DIAGNOSIS — R002 Palpitations: Secondary | ICD-10-CM

## 2012-08-16 NOTE — Patient Instructions (Signed)
Your physician wants you to follow-up in: 1 YEAR with Dr Cooper.  You will receive a reminder letter in the mail two months in advance. If you don't receive a letter, please call our office to schedule the follow-up appointment.  Your physician recommends that you continue on your current medications as directed. Please refer to the Current Medication list given to you today.  

## 2012-08-16 NOTE — Progress Notes (Signed)
HPI:  78 year-old woman presenting for followup evaluation. The patient has been seen for chest pain, palpitations, and history of TIA. She's been maintained on long-term Plavix. She was seen last about one year ago for a preoperative evaluation for knee replacement surgery. She's had normal left ventricular systolic function with mild LVH.  The patient is doing well. Her knee surgery went well and her mobility is improved. She's walking every day but does a slow pace. She doesn't have any chest pain, chest pressure, dyspnea, edema, or palpitations. She's had no recurrent stroke or TIA symptoms and specifically denies symptoms of amaurosis, numbness, tingling, or weakness of her extremities.  Outpatient Encounter Prescriptions as of 08/16/2012  Medication Sig Dispense Refill  . acetaminophen (TYLENOL) 325 MG tablet Take 2 tablets (650 mg total) by mouth every 6 (six) hours as needed (or Fever >/= 101).  40 tablet  0  . ALPRAZolam (XANAX) 0.5 MG tablet Take 0.5 mg by mouth at bedtime as needed for sleep.      Marland Kitchen atorvastatin (LIPITOR) 10 MG tablet Take 1 tablet (10 mg total) by mouth daily.  90 tablet  0  . clopidogrel (PLAVIX) 75 MG tablet Take 1 tablet (75 mg total) by mouth daily.  90 tablet  3  . cyclobenzaprine (FLEXERIL) 10 MG tablet Take 1 tablet (10 mg total) by mouth 2 (two) times daily as needed for muscle spasms.  14 tablet  0  . esomeprazole (NEXIUM) 40 MG capsule Take 40 mg by mouth 2 (two) times daily.      Marland Kitchen estradiol (ESTRACE) 0.1 MG/GM vaginal cream Place 2 g vaginally 2 (two) times a week. No particular days of the week      . HYDROcodone-acetaminophen (NORCO/VICODIN) 5-325 MG per tablet Take 1-2 tablets every 6 hours as needed for severe pain  12 tablet  0  . raloxifene (EVISTA) 60 MG tablet Take 60 mg by mouth daily.      . sertraline (ZOLOFT) 100 MG tablet Take 200 mg by mouth daily.      Marland Kitchen sulfamethoxazole-trimethoprim (BACTRIM DS) 800-160 MG per tablet Take 1 tablet by mouth  at bedtime.       . [DISCONTINUED] cyclobenzaprine (FLEXERIL) 10 MG tablet Take 10 mg by mouth once.      . [DISCONTINUED] polyethylene glycol (MIRALAX / GLYCOLAX) packet Take 17 g by mouth daily as needed (constipation).        No facility-administered encounter medications on file as of 08/16/2012.    Allergies  Allergen Reactions  . Alendronate Sodium     REACTION: JAW PAIN  . Celecoxib     REACTION: GI UPSET  . Latex   . Oxycodone-Acetaminophen     REACTION: TOO STRONG    Past Medical History  Diagnosis Date  . Painful respiration   . Personal history of unspecified circulatory disease   . Palpitations   . Other and unspecified hyperlipidemia   . Esophageal reflux   . Depressive disorder, not elsewhere classified   . Anxiety state, unspecified   . Unspecified arthropathy, multiple sites   . Diaphragmatic hernia without mention of obstruction or gangrene   . External hemorrhoids without mention of complication   . Diverticulosis of colon (without mention of hemorrhage)   . Diarrhea   . Special screening for malignant neoplasms of other sites   . Unspecified vitamin D deficiency   . Other malaise and fatigue 08-11-11    hx. "chronic fatigue syndrome"  . Disorder of bone  and cartilage, unspecified   . Helicobacter pylori (H. pylori)   . Macular degeneration (senile) of retina, unspecified   . Shortness of breath 08-11-11    with exertion only at present  . Unspecified cerebral artery occlusion with cerebral infarction 08-11-11    '97/ '06( TIA)-affected lt. side, no residual  . Gallstones 08-11-11    asymptomatic at present  . Personal history of other diseases of digestive system 08-11-11    "Irittable bowel syndrome"  . Urinary incontinence 08-11-11    wears peripad daily  . Osteoarthrosis, unspecified whether generalized or localized, unspecified site 08-11-11    hx. "rhematoid arthritis", osteoarthritis, DDD, bursitis(hip)  . Lumbar spondylosis     BP 118/72  Pulse  64  Ht 5' 3.5" (1.613 m)  Wt 84.423 kg (186 lb 1.9 oz)  BMI 32.45 kg/m2  PHYSICAL EXAM: Pt is alert and oriented, NAD HEENT: normal Neck: JVP - normal, carotids 2+= without bruits Lungs: CTA bilaterally CV: RRR without murmur or gallop Abd: soft, NT, Positive BS, no hepatomegaly Ext: no C/C/E, distal pulses intact and equal Skin: warm/dry no rash  EKG:  Sinus rhythm 72 beats per minute, low-voltage QRS, otherwise within normal limits.  ASSESSMENT AND PLAN: 1. Heart palpitations. Doing well without recurrent symptoms. Continue observation.  2. Hyperlipidemia. Upcoming labs in September with Dr. Milinda Antis. Will review. She is on atorvastatin 10 mg.  3. History of TIA. Continues with Plavix without bleeding problems and without recurrent neurologic symptoms.  For followup I will see her back in one year  Tonny Bollman 08/16/2012 4:21 PM

## 2012-08-20 DIAGNOSIS — M810 Age-related osteoporosis without current pathological fracture: Secondary | ICD-10-CM | POA: Diagnosis not present

## 2012-08-20 DIAGNOSIS — M47817 Spondylosis without myelopathy or radiculopathy, lumbosacral region: Secondary | ICD-10-CM | POA: Diagnosis not present

## 2012-08-30 ENCOUNTER — Other Ambulatory Visit: Payer: Self-pay | Admitting: Family Medicine

## 2012-08-30 NOTE — Telephone Encounter (Signed)
Please refill for 3 mo  

## 2012-08-30 NOTE — Telephone Encounter (Signed)
done

## 2012-08-30 NOTE — Telephone Encounter (Signed)
Electronic refill request, please advise  

## 2012-09-18 ENCOUNTER — Other Ambulatory Visit: Payer: Self-pay | Admitting: Family Medicine

## 2012-09-19 ENCOUNTER — Encounter: Payer: Self-pay | Admitting: Family Medicine

## 2012-09-19 ENCOUNTER — Ambulatory Visit (INDEPENDENT_AMBULATORY_CARE_PROVIDER_SITE_OTHER): Payer: Medicare Other | Admitting: Family Medicine

## 2012-09-19 VITALS — BP 110/68 | HR 92 | Temp 97.8°F | Ht 63.5 in | Wt 185.8 lb

## 2012-09-19 DIAGNOSIS — E559 Vitamin D deficiency, unspecified: Secondary | ICD-10-CM

## 2012-09-19 DIAGNOSIS — F329 Major depressive disorder, single episode, unspecified: Secondary | ICD-10-CM | POA: Diagnosis not present

## 2012-09-19 DIAGNOSIS — Z23 Encounter for immunization: Secondary | ICD-10-CM

## 2012-09-19 DIAGNOSIS — M899 Disorder of bone, unspecified: Secondary | ICD-10-CM

## 2012-09-19 DIAGNOSIS — E785 Hyperlipidemia, unspecified: Secondary | ICD-10-CM | POA: Diagnosis not present

## 2012-09-19 LAB — COMPREHENSIVE METABOLIC PANEL
ALT: 14 U/L (ref 0–35)
Albumin: 4.3 g/dL (ref 3.5–5.2)
CO2: 26 mEq/L (ref 19–32)
Chloride: 105 mEq/L (ref 96–112)
GFR: 57.34 mL/min — ABNORMAL LOW (ref >60.00)
Glucose, Bld: 92 mg/dL (ref 70–99)
Potassium: 4.7 mEq/L (ref 3.5–5.1)
Sodium: 138 mEq/L (ref 135–145)
Total Protein: 7.2 g/dL (ref 6.0–8.3)

## 2012-09-19 LAB — LIPID PANEL: Cholesterol: 173 mg/dL (ref 0–200)

## 2012-09-19 MED ORDER — SERTRALINE HCL 100 MG PO TABS: 200.0000 mg | ORAL_TABLET | Freq: Every day | ORAL | Status: DC

## 2012-09-19 MED ORDER — CLOPIDOGREL BISULFATE 75 MG PO TABS: 75.0000 mg | ORAL_TABLET | Freq: Every day | ORAL | Status: DC

## 2012-09-19 MED ORDER — ATORVASTATIN CALCIUM 10 MG PO TABS: 10.0000 mg | ORAL_TABLET | Freq: Every day | ORAL | Status: DC

## 2012-09-19 MED ORDER — RALOXIFENE HCL 60 MG PO TABS: 60.0000 mg | ORAL_TABLET | Freq: Every day | ORAL | Status: DC

## 2012-09-19 MED ORDER — ESOMEPRAZOLE MAGNESIUM 40 MG PO CPDR: 40.0000 mg | DELAYED_RELEASE_CAPSULE | Freq: Two times a day (BID) | ORAL | Status: DC

## 2012-09-19 NOTE — Progress Notes (Signed)
Subjective:    Patient ID: [name] [name], female    DOB: 06-15-1934, 78 y.o.   MRN: [id number]  HPI Here for f/u of chronic health problems  Wt is down 1 lb with bmi of 32  Got through back surgery and then PT -that seemed to help  Has chronic pain med  She can now be more active and shop and get out  Continues to do the PT exercises  Osteopenia-dexa 2/14 Stable  On evista- no problems with that  Ca and D   Hyperlipidemia Due for a check on lipitor and diet Lab Results  Component Value Date   CHOL 183 05/06/2011   CHOL 163 11/06/2009   CHOL 163 06/26/2008   Lab Results  Component Value Date   HDL 70.30 05/06/2011   HDL 61 11/06/2009   HDL 65.00 06/26/2008   Lab Results  Component Value Date   LDLCALC 87 05/06/2011   LDLCALC 71 11/06/2009   LDLCALC 75 06/26/2008   Lab Results  Component Value Date   TRIG 131.0 05/06/2011   TRIG 154* 11/06/2009   TRIG 114.0 06/26/2008   Lab Results  Component Value Date   CHOLHDL 3 05/06/2011   CHOLHDL 2.7 Ratio 11/06/2009   CHOLHDL 3 06/26/2008   No results found for this basename: LDLDIRECT   eating very well - low fat  Walks every morning now   Mood-on zoloft 200 mg (she has not successfully gotten off of this in the past)  Zoster status -would like one but insurance may not pay  Flu shot - will get today  Patient Active Problem List   Diagnosis Date Noted  . Dysphagia 10/17/2011  . OA (osteoarthritis) of knee 08/16/2011  . Hemorrhoids 08/10/2011  . Other dyspnea and respiratory abnormality 06/21/2010  . Urinary incontinence 05/27/2010  . OVERACTIVE BLADDER 03/12/2010  . DEGENERATIVE DISC DISEASE 04/13/2009  . GERD 10/15/2008  . CEREBROVASCULAR ACCIDENT, HX OF 10/15/2008  . ARTHRITIS, GENERALIZED 06/26/2008  . EXTERNAL HEMORRHOIDS 02/01/2008  . HIATAL HERNIA 02/01/2008  . DIVERTICULOSIS OF COLON 02/01/2008  . GASTRITIS, HX OF 02/01/2008  . UNSPECIFIED VITAMIN D DEFICIENCY 07/03/2007  . OSTEOPENIA 06/13/2007   . FATIGUE 06/13/2007  . HELICOBACTER PYLORI INFECTION 06/06/2006  . HYPERLIPIDEMIA 06/06/2006  . ANXIETY 06/06/2006  . DEPRESSION 06/06/2006  . MACULAR DEGENERATION 06/06/2006  . CVA 06/06/2006  . OSTEOARTHRITIS 06/06/2006  . PALPITATIONS 05/25/2006   Past Medical History  Diagnosis Date  . Painful respiration   . Personal history of unspecified circulatory disease   . Palpitations   . Other and unspecified hyperlipidemia   . Esophageal reflux   . Depressive disorder, not elsewhere classified   . Anxiety state, unspecified   . Unspecified arthropathy, multiple sites   . Diaphragmatic hernia without mention of obstruction or gangrene   . External hemorrhoids without mention of complication   . Diverticulosis of colon (without mention of hemorrhage)   . Diarrhea   . Special screening for malignant neoplasms of other sites   . Unspecified vitamin D deficiency   . Other malaise and fatigue 08-11-11    hx. "chronic fatigue syndrome"  . Disorder of bone and cartilage, unspecified   . Helicobacter pylori (H. pylori)   . Macular degeneration (senile) of retina, unspecified   . Shortness of breath 08-11-11    with exertion only at present  . Unspecified cerebral artery occlusion with cerebral infarction 08-11-11    '97/ '06( TIA)-affected lt. side, no residual  . Gallstones 08-11-11  asymptomatic at present  . Personal history of other diseases of digestive system 08-11-11    "Irittable bowel syndrome"  . Urinary incontinence 08-11-11    wears peripad daily  . Osteoarthrosis, unspecified whether generalized or localized, unspecified site 08-11-11    hx. "rhematoid arthritis", osteoarthritis, DDD, bursitis(hip)  . Lumbar spondylosis    Past Surgical History  Procedure Laterality Date  . Appendectomy    . Tubal ligation    . Knee surgery  08-11-11    rt. knee scope  . Cataract extraction  08-11-11    Bilateral  . Back surgery  08-11-11    '11-hx. lumbar fusion with retained  hardware  . Abdominal hysterectomy  08-11-11  . Total knee arthroplasty  08/16/2011    Procedure: TOTAL KNEE ARTHROPLASTY;  Surgeon: Loanne Drilling, MD;  Location: [facility];  Service: Orthopedics;  Laterality: Right;   History  Substance Use Topics  . Smoking status: Never Smoker   . Smokeless tobacco: Never Used  . Alcohol Use: No   Family History  Problem Relation Age of Onset  . Heart disease Father   . Diabetes Sister   . Coronary artery disease Brother   . Diabetes Brother   . Heart disease Brother   . Pancreatic cancer Daughter   . Colon cancer Neg Hx    Allergies  Allergen Reactions  . Alendronate Sodium     REACTION: JAW PAIN  . Celecoxib     REACTION: GI UPSET  . Latex   . Oxycodone-Acetaminophen     REACTION: TOO STRONG   Current Outpatient Prescriptions on File Prior to Visit  Medication Sig Dispense Refill  . ALPRAZolam (XANAX) 0.5 MG tablet Take 0.5 mg by mouth at bedtime as needed for sleep.      Marland Kitchen atorvastatin (LIPITOR) 10 MG tablet Take 1 tablet (10 mg total) by mouth daily.  90 tablet  0  . clopidogrel (PLAVIX) 75 MG tablet Take 1 tablet (75 mg total) by mouth daily.  90 tablet  3  . cyclobenzaprine (FLEXERIL) 10 MG tablet Take 1 tablet (10 mg total) by mouth 2 (two) times daily as needed for muscle spasms.  14 tablet  0  . esomeprazole (NEXIUM) 40 MG capsule Take 40 mg by mouth 2 (two) times daily.      Marland Kitchen estradiol (ESTRACE) 0.1 MG/GM vaginal cream Place 2 g vaginally 2 (two) times a week. No particular days of the week      . HYDROcodone-acetaminophen (NORCO/VICODIN) 5-325 MG per tablet Take 1-2 tablets every 6 hours as needed for severe pain  12 tablet  0  . raloxifene (EVISTA) 60 MG tablet Take 60 mg by mouth daily.      . sertraline (ZOLOFT) 100 MG tablet TAKE 2 TABLETS DAILY  180 tablet  0  . sulfamethoxazole-trimethoprim (BACTRIM DS) 800-160 MG per tablet Take 1 tablet by mouth at bedtime.        No current facility-administered medications on file  prior to visit.     Review of Systems Review of Systems  Constitutional: Negative for fever, appetite change, fatigue and unexpected weight change.  Eyes: Negative for pain and visual disturbance.  Respiratory: Negative for cough and shortness of breath.   Cardiovascular: Negative for cp or palpitations    Gastrointestinal: Negative for nausea, diarrhea and constipation.  Genitourinary: Negative for urgency and frequency.  Skin: Negative for pallor or rash   MSK pos for limited rom of back/ knees that is improved  Neurological: Negative  for weakness, light-headedness, numbness and headaches.  Hematological: Negative for adenopathy. Does not bruise/bleed easily.  Psychiatric/Behavioral: Negative for dysphoric mood. The patient is not nervous/anxious.  (on med)       Objective:   Physical Exam  Constitutional: She appears well-developed and well-nourished. No distress.  obese and well appear  HENT:  Head: Normocephalic and atraumatic.  Mouth/Throat: Oropharynx is clear and moist.  Eyes: Conjunctivae and EOM are normal. Pupils are equal, round, and reactive to light. No scleral icterus.  Neck: Normal range of motion. Neck supple. No JVD present. Carotid bruit is not present. No thyromegaly present.  Cardiovascular: Normal rate, regular rhythm and intact distal pulses.  Exam reveals no gallop.   Pulmonary/Chest: Effort normal and breath sounds normal. No respiratory distress. She has no wheezes.  Abdominal: Soft. Bowel sounds are normal. She exhibits no distension, no abdominal bruit and no mass. There is no tenderness.  Musculoskeletal: She exhibits no edema and no tenderness.  Lymphadenopathy:    She has no cervical adenopathy.  Neurological: She is alert. She has normal reflexes.  Skin: Skin is warm and dry. No rash noted. No erythema. No pallor.  Psychiatric: She has a normal mood and affect.          Assessment & Plan:

## 2012-09-19 NOTE — Patient Instructions (Addendum)
Flu vaccine today Labs today  Take care of yourself  Keep working on weight loss If you are interested in a shingles/zoster vaccine - call your insurance to check on coverage,( you should not get it within 1 month of other vaccines) , then call us for a prescription  for it to take to a pharmacy that gives the shot , or make a nurse visit to get it here depending on your coverage

## 2012-09-20 ENCOUNTER — Other Ambulatory Visit: Payer: Self-pay | Admitting: Family Medicine

## 2012-09-20 ENCOUNTER — Encounter: Payer: Self-pay | Admitting: *Deleted

## 2012-09-20 LAB — VITAMIN D 25 HYDROXY (VIT D DEFICIENCY, FRACTURES): Vit D, 25-Hydroxy: 46 ng/mL (ref 30–89)

## 2012-09-20 NOTE — Assessment & Plan Note (Signed)
Doing well on current high dose zoloft Unsuccessful coming off of it in the past  Refilled

## 2012-09-20 NOTE — Assessment & Plan Note (Signed)
utd on dexa On evista No fx Rev ca and D D level ordered

## 2012-09-20 NOTE — Assessment & Plan Note (Signed)
Lipid today  Rev low sat fat diet and goals for lipids  On statin and diet

## 2012-09-20 NOTE — Assessment & Plan Note (Signed)
Pt taking D otc Disc imp to bone and overall health

## 2012-11-30 DIAGNOSIS — M47817 Spondylosis without myelopathy or radiculopathy, lumbosacral region: Secondary | ICD-10-CM | POA: Diagnosis not present

## 2012-12-04 ENCOUNTER — Telehealth: Payer: Self-pay | Admitting: *Deleted

## 2012-12-04 NOTE — Telephone Encounter (Signed)
Faxed form back to pharmacy with Dr. Royden Purl comments about waiting until it comes back in stock

## 2012-12-04 NOTE — Telephone Encounter (Signed)
There is no alternative-will have to wait until it comes back in stock

## 2012-12-04 NOTE — Telephone Encounter (Signed)
Raloxifene 60 mg and brand Evista 60 mg are both out of stock, pharmacy request an alternative be prescribed. I will place faxed request in your inbox.

## 2012-12-05 ENCOUNTER — Other Ambulatory Visit: Payer: Self-pay | Admitting: Neurosurgery

## 2012-12-05 DIAGNOSIS — M47817 Spondylosis without myelopathy or radiculopathy, lumbosacral region: Secondary | ICD-10-CM

## 2012-12-10 ENCOUNTER — Ambulatory Visit
Admission: RE | Admit: 2012-12-10 | Discharge: 2012-12-10 | Disposition: A | Discharge status: Home / Self Care | Payer: Medicare Other | Source: Ambulatory Visit | Attending: Neurosurgery | Admitting: Neurosurgery

## 2012-12-10 DIAGNOSIS — M47817 Spondylosis without myelopathy or radiculopathy, lumbosacral region: Secondary | ICD-10-CM

## 2012-12-10 DIAGNOSIS — M539 Dorsopathy, unspecified: Secondary | ICD-10-CM | POA: Diagnosis not present

## 2013-01-15 ENCOUNTER — Telehealth: Payer: Self-pay | Admitting: Family Medicine

## 2013-01-15 DIAGNOSIS — M47817 Spondylosis without myelopathy or radiculopathy, lumbosacral region: Secondary | ICD-10-CM | POA: Diagnosis not present

## 2013-01-15 MED ORDER — ALPRAZOLAM 0.5 MG PO TABS: 0.5000 mg | ORAL_TABLET | Freq: Every evening | ORAL | Status: DC | PRN

## 2013-01-15 NOTE — Telephone Encounter (Signed)
Please ask pt which one she wants and if she is comfortable with mail delivery (risk of loss etc)- then send back to me thanks

## 2013-01-15 NOTE — Telephone Encounter (Signed)
Printed to fax  

## 2013-01-15 NOTE — Telephone Encounter (Signed)
Received fax refill request, they are requesting a paper Rx to be faxed to them since they are a mail order pharmacy, [facility] is requesting the alprazolam ODT (not on med list), just regular alprazolam on med listplease advise

## 2013-01-15 NOTE — Telephone Encounter (Signed)
Pt is just taking the regular xanax, med changed, please advise, will need paper Rx faxed to [facility]

## 2013-01-16 NOTE — Telephone Encounter (Signed)
Rx faxed to [facility].

## 2013-03-07 DIAGNOSIS — D3130 Benign neoplasm of unspecified choroid: Secondary | ICD-10-CM | POA: Diagnosis not present

## 2013-03-07 DIAGNOSIS — H5210 Myopia, unspecified eye: Secondary | ICD-10-CM | POA: Diagnosis not present

## 2013-03-07 DIAGNOSIS — H353190 Nonexudative age-related macular degeneration, unspecified eye, stage unspecified: Secondary | ICD-10-CM | POA: Diagnosis not present

## 2013-03-13 ENCOUNTER — Telehealth: Payer: Self-pay | Admitting: Family Medicine

## 2013-03-13 MED ORDER — ZOSTER VACCINE LIVE 19400 UNT/0.65ML ~~LOC~~ SOLR: 0.6500 mL | Freq: Once | SUBCUTANEOUS | Status: DC

## 2013-03-13 NOTE — Telephone Encounter (Signed)
See prev note, pt would like a paper Rx for shingles vaccine mailed to her, please advise

## 2013-03-13 NOTE — Telephone Encounter (Signed)
Pt has found a place she can get the shingles vaccine for free. She is asking if we could mail her rx for that to her. Please advise.

## 2013-03-13 NOTE — Telephone Encounter (Signed)
Rx mailed to pt and Pt notified

## 2013-03-13 NOTE — Telephone Encounter (Signed)
Px printed for pick up in IN box  

## 2013-04-15 DIAGNOSIS — M47817 Spondylosis without myelopathy or radiculopathy, lumbosacral region: Secondary | ICD-10-CM | POA: Diagnosis not present

## 2013-04-15 DIAGNOSIS — M810 Age-related osteoporosis without current pathological fracture: Secondary | ICD-10-CM | POA: Diagnosis not present

## 2013-04-23 DIAGNOSIS — M47817 Spondylosis without myelopathy or radiculopathy, lumbosacral region: Secondary | ICD-10-CM | POA: Diagnosis not present

## 2013-04-26 DIAGNOSIS — M47817 Spondylosis without myelopathy or radiculopathy, lumbosacral region: Secondary | ICD-10-CM | POA: Diagnosis not present

## 2013-04-30 DIAGNOSIS — M47817 Spondylosis without myelopathy or radiculopathy, lumbosacral region: Secondary | ICD-10-CM | POA: Diagnosis not present

## 2013-05-03 DIAGNOSIS — M47817 Spondylosis without myelopathy or radiculopathy, lumbosacral region: Secondary | ICD-10-CM | POA: Diagnosis not present

## 2013-05-07 ENCOUNTER — Encounter: Payer: Self-pay | Admitting: Family Medicine

## 2013-05-07 ENCOUNTER — Ambulatory Visit (INDEPENDENT_AMBULATORY_CARE_PROVIDER_SITE_OTHER): Payer: Medicare Other | Admitting: Family Medicine

## 2013-05-07 ENCOUNTER — Encounter: Payer: Self-pay | Admitting: Radiology

## 2013-05-07 VITALS — BP 142/78 | HR 86 | Temp 97.7°F | Ht 63.5 in | Wt 198.5 lb

## 2013-05-07 DIAGNOSIS — Z23 Encounter for immunization: Secondary | ICD-10-CM

## 2013-05-07 DIAGNOSIS — IMO0002 Reserved for concepts with insufficient information to code with codable children: Secondary | ICD-10-CM | POA: Diagnosis not present

## 2013-05-07 DIAGNOSIS — Z79899 Other long term (current) drug therapy: Secondary | ICD-10-CM | POA: Diagnosis not present

## 2013-05-07 NOTE — Progress Notes (Signed)
Subjective:    Patient ID: [name] [name], female    DOB: 1934/02/11, 79 y.o.   MRN: [id number]  HPI Pt is trying to get disability - through her husband's [name] [name] program  Would help to get long term care of she needed   Saw Dr [name] [name] last month   Deg disc dz  Spinal stenosis getting worse  Has had a fusion L4 and L5  She has had 2 back surgeries - last one this past Jan -not a lot of help (told she is no longer a candidate) Seeing PT - does massage to help widen disc spaces   She has chronic pain  Hurts to sit or stand (esp to cook) Spends much of the time in bed  She wears a back brace when she is up   Going to PT since 04/17/13  Scored close to severe disability index of oswesting Back index of 58% (60% considered severe)- rev note from her PT  Cannot cook or clean and has limited mobility  Goes to PT 2 times per week - massage and some exercises incl pelvic raises and leg lifts   Has gained wt due to her inability to move   Oxycodone 5-325 as needed - usually takes one in the am and afternoon-- DR [name] [name] Cyclobenzaprine at pm - Dr [name] [name]  Xanax at bedtime times one -- that comes from [place] Both from Dr [name] [name]    She needs a letter from me to "whom it may concern"  In agreement with Dr [name] [name] and her PT [name] [name]   Patient Active Problem List   Diagnosis Date Noted  . Dysphagia 10/17/2011  . OA (osteoarthritis) of knee 08/16/2011  . Hemorrhoids 08/10/2011  . Other dyspnea and respiratory abnormality 06/21/2010  . Urinary incontinence 05/27/2010  . OVERACTIVE BLADDER 03/12/2010  . DEGENERATIVE DISC DISEASE 04/13/2009  . GERD 10/15/2008  . CEREBROVASCULAR ACCIDENT, HX OF 10/15/2008  . ARTHRITIS, GENERALIZED 06/26/2008  . EXTERNAL HEMORRHOIDS 02/01/2008  . HIATAL HERNIA 02/01/2008  . DIVERTICULOSIS OF COLON 02/01/2008  . GASTRITIS, HX OF 02/01/2008  . UNSPECIFIED VITAMIN D DEFICIENCY 07/03/2007  . OSTEOPENIA 06/13/2007  . FATIGUE 06/13/2007  . HELICOBACTER PYLORI  INFECTION 06/06/2006  . HYPERLIPIDEMIA 06/06/2006  . ANXIETY 06/06/2006  . DEPRESSION 06/06/2006  . MACULAR DEGENERATION 06/06/2006  . CVA 06/06/2006  . OSTEOARTHRITIS 06/06/2006  . PALPITATIONS 05/25/2006   Past Medical History  Diagnosis Date  . Painful respiration   . Personal history of unspecified circulatory disease   . Palpitations   . Other and unspecified hyperlipidemia   . Esophageal reflux   . Depressive disorder, not elsewhere classified   . Anxiety state, unspecified   . Unspecified arthropathy, multiple sites   . Diaphragmatic hernia without mention of obstruction or gangrene   . External hemorrhoids without mention of complication   . Diverticulosis of colon (without mention of hemorrhage)   . Diarrhea   . Special screening for malignant neoplasms of other sites   . Unspecified vitamin D deficiency   . Other malaise and fatigue 08-11-11    hx. "chronic fatigue syndrome"  . Disorder of bone and cartilage, unspecified   . Helicobacter pylori (H. pylori)   . Macular degeneration (senile) of retina, unspecified   . Shortness of breath 08-11-11    with exertion only at present  . Unspecified cerebral artery occlusion with cerebral infarction 08-11-11    '97/ '06( TIA)-affected lt. side, no residual  . Gallstones 08-11-11    asymptomatic  at present  . Personal history of other diseases of digestive system 08-11-11    "Irittable bowel syndrome"  . Urinary incontinence 08-11-11    wears peripad daily  . Osteoarthrosis, unspecified whether generalized or localized, unspecified site 08-11-11    hx. "rhematoid arthritis", osteoarthritis, DDD, bursitis(hip)  . Lumbar spondylosis    Past Surgical History  Procedure Laterality Date  . Appendectomy    . Tubal ligation    . Knee surgery  08-11-11    rt. knee scope  . Cataract extraction  08-11-11    Bilateral  . Back surgery  08-11-11    '11-hx. lumbar fusion with retained hardware  . Abdominal hysterectomy  08-11-11  .  Total knee arthroplasty  08/16/2011    Procedure: TOTAL KNEE ARTHROPLASTY;  Surgeon: Gearlean Alf, MD;  Location: [facility];  Service: Orthopedics;  Laterality: Right;   History  Substance Use Topics  . Smoking status: Never Smoker   . Smokeless tobacco: Never Used  . Alcohol Use: No   Family History  Problem Relation Age of Onset  . Heart disease Father   . Diabetes Sister   . Coronary artery disease Brother   . Diabetes Brother   . Heart disease Brother   . Pancreatic cancer Daughter   . Colon cancer Neg Hx    Allergies  Allergen Reactions  . Alendronate Sodium     REACTION: JAW PAIN  . Celecoxib     REACTION: GI UPSET  . Latex   . Oxycodone-Acetaminophen     REACTION: TOO STRONG   Current Outpatient Prescriptions on File Prior to Visit  Medication Sig Dispense Refill  . ALPRAZolam (XANAX) 0.5 MG tablet Take 1 tablet (0.5 mg total) by mouth at bedtime as needed for sleep.  90 tablet  1  . atorvastatin (LIPITOR) 10 MG tablet Take 1 tablet (10 mg total) by mouth daily.  90 tablet  3  . clopidogrel (PLAVIX) 75 MG tablet Take 1 tablet (75 mg total) by mouth daily.  90 tablet  3  . esomeprazole (NEXIUM) 40 MG capsule Take 1 capsule (40 mg total) by mouth 2 (two) times daily.  180 capsule  3  . estradiol (ESTRACE) 0.1 MG/GM vaginal cream Place 2 g vaginally 2 (two) times a week. No particular days of the week      . HYDROcodone-acetaminophen (NORCO/VICODIN) 5-325 MG per tablet Take 1-2 tablets every 6 hours as needed for severe pain  12 tablet  0  . raloxifene (EVISTA) 60 MG tablet Take 1 tablet (60 mg total) by mouth daily.  90 tablet  3  . sertraline (ZOLOFT) 100 MG tablet Take 2 tablets (200 mg total) by mouth daily.  180 tablet  3  . sulfamethoxazole-trimethoprim (BACTRIM DS) 800-160 MG per tablet Take 1 tablet by mouth at bedtime.        No current facility-administered medications on file prior to visit.    Review of Systems Review of Systems  Constitutional: Negative  for fever, appetite change, fatigue and unexpected weight change.  Eyes: Negative for pain and visual disturbance.  Respiratory: Negative for cough and shortness of breath.   Cardiovascular: Negative for cp or palpitations    Gastrointestinal: Negative for nausea, diarrhea and constipation.  Genitourinary: Negative for urgency and frequency.  Skin: Negative for pallor or rash   MSK pos for chronic back pain with disability  Neurological: Negative for weakness, light-headedness, numbness and headaches.  Hematological: Negative for adenopathy. Does not bruise/bleed easily.  Psychiatric/Behavioral:  Negative for dysphoric mood. The patient is not nervous/anxious.         Objective:   Physical Exam  Constitutional: She appears well-developed and well-nourished. No distress.  obese and well appearing   HENT:  Head: Normocephalic and atraumatic.  Eyes: Conjunctivae and EOM are normal. Pupils are equal, round, and reactive to light.  Neck: Normal range of motion. Neck supple.  Cardiovascular: Normal rate and regular rhythm.   Pulmonary/Chest: Effort normal and breath sounds normal.  Musculoskeletal: She exhibits tenderness. She exhibits no edema.  LS is diffusely tender Limited rom Labored gait  No foot drop  Lymphadenopathy:    She has no cervical adenopathy.  Neurological: She is alert. She has normal reflexes. No cranial nerve deficit. She exhibits normal muscle tone. Coordination normal.  Skin: Skin is warm and dry. No rash noted. No erythema.  Psychiatric: She has a normal mood and affect.          Assessment & Plan:

## 2013-05-07 NOTE — Progress Notes (Signed)
Pre visit review using our clinic review tool, if applicable. No additional management support is needed unless otherwise documented below in the visit note. 

## 2013-05-07 NOTE — Patient Instructions (Signed)
I will have a letter ready for you tomorrow am  prevnar vaccine today  Take care of yourself  Photocopy the PT letter on the way out so I can have a copy  Take your calcium and vitamin D for bone health

## 2013-05-08 DIAGNOSIS — M47817 Spondylosis without myelopathy or radiculopathy, lumbosacral region: Secondary | ICD-10-CM | POA: Diagnosis not present

## 2013-05-08 NOTE — Assessment & Plan Note (Addendum)
Chronic pain/ spinal stenosis s/p facet jt inj and 2 fusion surg of L4-5 with Dr Carloyn Manner [facility] twice weekly  Is disabled/ cannot sit or stand for more than a few minutes and needs health with ADLS and cooking/ housework  Disability letter written so she can get veteran's benefits  She will continue f/u with PT and DR Marijo Sanes with caution for severe pain  Flexeril with caution in evenings  >25 minutes spent in face to face time with patient, >50% spent in counselling or coordination of care

## 2013-05-10 DIAGNOSIS — M47817 Spondylosis without myelopathy or radiculopathy, lumbosacral region: Secondary | ICD-10-CM | POA: Diagnosis not present

## 2013-05-13 DIAGNOSIS — M47817 Spondylosis without myelopathy or radiculopathy, lumbosacral region: Secondary | ICD-10-CM | POA: Diagnosis not present

## 2013-05-15 DIAGNOSIS — M47817 Spondylosis without myelopathy or radiculopathy, lumbosacral region: Secondary | ICD-10-CM | POA: Diagnosis not present

## 2013-05-17 DIAGNOSIS — M47817 Spondylosis without myelopathy or radiculopathy, lumbosacral region: Secondary | ICD-10-CM | POA: Diagnosis not present

## 2013-05-22 DIAGNOSIS — M47817 Spondylosis without myelopathy or radiculopathy, lumbosacral region: Secondary | ICD-10-CM | POA: Diagnosis not present

## 2013-05-24 DIAGNOSIS — M47817 Spondylosis without myelopathy or radiculopathy, lumbosacral region: Secondary | ICD-10-CM | POA: Diagnosis not present

## 2013-06-07 ENCOUNTER — Encounter: Payer: Self-pay | Admitting: Family Medicine

## 2013-07-10 DIAGNOSIS — Z9889 Other specified postprocedural states: Secondary | ICD-10-CM | POA: Diagnosis not present

## 2013-07-10 DIAGNOSIS — M1710 Unilateral primary osteoarthritis, unspecified knee: Secondary | ICD-10-CM | POA: Diagnosis not present

## 2013-07-11 ENCOUNTER — Other Ambulatory Visit: Payer: Self-pay

## 2013-07-11 DIAGNOSIS — Z1231 Encounter for screening mammogram for malignant neoplasm of breast: Secondary | ICD-10-CM

## 2013-08-09 ENCOUNTER — Other Ambulatory Visit: Payer: Self-pay | Admitting: Family Medicine

## 2013-08-15 ENCOUNTER — Ambulatory Visit
Admission: RE | Admit: 2013-08-15 | Discharge: 2013-08-15 | Disposition: A | Discharge status: Home / Self Care | Payer: Medicare Other | Source: Ambulatory Visit

## 2013-08-15 ENCOUNTER — Encounter (INDEPENDENT_AMBULATORY_CARE_PROVIDER_SITE_OTHER): Payer: Self-pay

## 2013-08-15 ENCOUNTER — Encounter: Payer: Self-pay | Admitting: Cardiovascular Disease

## 2013-08-15 ENCOUNTER — Ambulatory Visit (INDEPENDENT_AMBULATORY_CARE_PROVIDER_SITE_OTHER): Payer: Medicare Other | Admitting: Cardiovascular Disease

## 2013-08-15 VITALS — BP 120/88 | HR 70 | Ht 63.5 in | Wt 201.4 lb

## 2013-08-15 DIAGNOSIS — R5381 Other malaise: Secondary | ICD-10-CM

## 2013-08-15 DIAGNOSIS — Z1231 Encounter for screening mammogram for malignant neoplasm of breast: Secondary | ICD-10-CM | POA: Diagnosis not present

## 2013-08-15 DIAGNOSIS — M1710 Unilateral primary osteoarthritis, unspecified knee: Secondary | ICD-10-CM | POA: Diagnosis not present

## 2013-08-15 DIAGNOSIS — R5383 Other fatigue: Principal | ICD-10-CM

## 2013-08-15 NOTE — Patient Instructions (Signed)
Your physician wants you to follow-up in: 1 year with Dr. Cooper.  You will receive a reminder letter in the mail two months in advance. If you don't receive a letter, please call our office to schedule the follow-up appointment.  Your physician recommends that you continue on your current medications as directed. Please refer to the Current Medication list given to you today.  

## 2013-08-15 NOTE — Progress Notes (Signed)
HPI:   79 year-old woman presenting for followup evaluation. The patient has been seen for chest pain, palpitations, and history of TIA. She's been maintained on long-term Plavix. Her last echocardiogram demonstrated normal LV systolic function with mild LVH.   She complains of DOE, but no change over recent years, primarily with 'overexertion.' Also has leg swelling in the afternoon/evenings. No orthopnea, PND, or chest pain. Planning on left knee arthroplasty in January 2016. Did well with right knee replacement.    Outpatient Encounter Prescriptions as of 08/15/2013  Medication Sig  . ALPRAZolam (XANAX) 0.5 MG tablet Take 1 tablet (0.5 mg total) by mouth at bedtime as needed for sleep.  Marland Kitchen atorvastatin (LIPITOR) 10 MG tablet TAKE 1 TABLET DAILY  . clopidogrel (PLAVIX) 75 MG tablet Take 1 tablet (75 mg total) by mouth daily.  . cyclobenzaprine (FLEXERIL) 10 MG tablet Take 10 mg by mouth 3 (three) times daily as needed for muscle spasms.  Marland Kitchen estradiol (ESTRACE) 0.1 MG/GM vaginal cream Place 2 g vaginally 2 (two) times a week. No particular days of the week  . HYDROcodone-acetaminophen (NORCO/VICODIN) 5-325 MG per tablet Take 1-2 tablets every 6 hours as needed for severe pain  . NEXIUM 40 MG capsule TAKE 1 CAPSULE TWICE A DAY  . oxyCODONE-acetaminophen (PERCOCET/ROXICET) 5-325 MG per tablet Take by mouth. TAKE 1 TO 2 TABS EVERY 6 HRS PRN FOR PAIN  . sertraline (ZOLOFT) 100 MG tablet Take 2 tablets (200 mg total) by mouth daily.  Marland Kitchen sulfamethoxazole-trimethoprim (BACTRIM DS) 800-160 MG per tablet Take 1 tablet by mouth at bedtime.   . [DISCONTINUED] raloxifene (EVISTA) 60 MG tablet Take 1 tablet (60 mg total) by mouth daily.    Allergies  Allergen Reactions  . Alendronate Sodium     REACTION: JAW PAIN  . Celecoxib     REACTION: GI UPSET  . Latex   . Oxycodone-Acetaminophen     REACTION: TOO STRONG    Past Medical History  Diagnosis Date  . Painful respiration   . Personal history  of unspecified circulatory disease   . Palpitations   . Other and unspecified hyperlipidemia   . Esophageal reflux   . Depressive disorder, not elsewhere classified   . Anxiety state, unspecified   . Unspecified arthropathy, multiple sites   . Diaphragmatic hernia without mention of obstruction or gangrene   . External hemorrhoids without mention of complication   . Diverticulosis of colon (without mention of hemorrhage)   . Diarrhea   . Special screening for malignant neoplasms of other sites   . Unspecified vitamin D deficiency   . Other malaise and fatigue 08-11-11    hx. "chronic fatigue syndrome"  . Disorder of bone and cartilage, unspecified   . Helicobacter pylori (H. pylori)   . Macular degeneration (senile) of retina, unspecified   . Shortness of breath 08-11-11    with exertion only at present  . Unspecified cerebral artery occlusion with cerebral infarction 08-11-11    '97/ '06( TIA)-affected lt. side, no residual  . Gallstones 08-11-11    asymptomatic at present  . Personal history of other diseases of digestive system 08-11-11    "Irittable bowel syndrome"  . Urinary incontinence 08-11-11    wears peripad daily  . Osteoarthrosis, unspecified whether generalized or localized, unspecified site 08-11-11    hx. "rhematoid arthritis", osteoarthritis, DDD, bursitis(hip)  . Lumbar spondylosis     ROS: Negative except as per HPI  BP 120/88  Pulse 70  Ht 5'  3.5" (1.613 m)  Wt 201 lb 6.4 oz (91.354 kg)  BMI 35.11 kg/m2  PHYSICAL EXAM: Pt is alert and oriented, NAD HEENT: normal Neck: JVP - normal, carotids 2+= without bruits Lungs: CTA bilaterally CV: RRR without murmur or gallop Abd: soft, NT, Positive BS, no hepatomegaly Ext: no C/C/E, distal pulses intact and equal Skin: warm/dry no rash  EKG:  Normal sinus rhythm 70 beats per minute, within normal limits.  ASSESSMENT AND PLAN: 1. Heart palpitations. Doing well without recurrent symptoms. Continue observation.    2. Hyperlipidemia.  She is on atorvastatin 10 mg. Last lipids: Lipid Panel     Component Value Date/Time   CHOL 173 09/19/2012 1047   TRIG 127.0 09/19/2012 1047   HDL 70.60 09/19/2012 1047   CHOLHDL 2 09/19/2012 1047   VLDL 25.4 09/19/2012 1047   LDLCALC 77 09/19/2012 1047   3. History of TIA. Continues with Plavix without bleeding problems and without recurrent neurologic symptoms.   4. Preoperative evaluation. No anginal symptoms, EKG within normal limits. No hx of structural heart disease or CHF. OK to proceed with surgery at low cardiac risk. No testing indicated.  For followup I will see her back in one year   Sherren Mocha 08/15/2013 3:31 PM

## 2013-08-19 ENCOUNTER — Encounter: Payer: Self-pay | Admitting: *Deleted

## 2013-08-20 ENCOUNTER — Other Ambulatory Visit: Payer: Self-pay | Admitting: Family Medicine

## 2013-09-14 ENCOUNTER — Other Ambulatory Visit: Payer: Self-pay | Admitting: Family Medicine

## 2013-09-17 ENCOUNTER — Other Ambulatory Visit: Payer: Self-pay | Admitting: Family Medicine

## 2013-09-17 NOTE — Telephone Encounter (Signed)
Electronic refill request, please advise  

## 2013-09-17 NOTE — Telephone Encounter (Signed)
Please schedule an annual exam for spring and refill until then

## 2013-09-19 NOTE — Telephone Encounter (Signed)
Pt was out of town and request we call her back on Tuesday, pt said that she thinks she had a CPE back in Oct. of last yr. So she would be due this Oct for her CPE

## 2013-09-30 NOTE — Progress Notes (Signed)
Patient ID: [name] [name], female   DOB: Dec 28, 1934, 79 y.o.   MRN: [id number]  Note from Dr Burt Knack:   She's ok to hold plavix 5-7 days before surgery. thx        Previous Messages      ----- Message -----  From: Abner Greenspan, MD  Sent: 08/28/2013 11:03 AM  To: Sherren Mocha, MD   Hi- hope all is well with you  I just rec a medial clearance form for this pt from Dr Maureen Ralphs for total knee replacement - he needs to know what to do with her plavix re; how safe it is to stop and how many days pre -op etc.   What are your thoughts?  Thanks--- Roque Lias T.

## 2013-10-20 ENCOUNTER — Other Ambulatory Visit: Payer: Self-pay | Admitting: Family Medicine

## 2013-10-22 NOTE — Telephone Encounter (Signed)
rx sent electronically. 

## 2013-10-22 NOTE — Telephone Encounter (Signed)
Please refill for 6 mo 

## 2013-10-22 NOTE — Telephone Encounter (Signed)
Electronic refill request, no recent/future appt., please advise  

## 2013-11-07 ENCOUNTER — Other Ambulatory Visit: Payer: Self-pay | Admitting: Family Medicine

## 2013-11-15 DIAGNOSIS — M1712 Unilateral primary osteoarthritis, left knee: Secondary | ICD-10-CM | POA: Diagnosis not present

## 2013-11-26 DIAGNOSIS — M1712 Unilateral primary osteoarthritis, left knee: Secondary | ICD-10-CM | POA: Diagnosis not present

## 2013-12-16 ENCOUNTER — Other Ambulatory Visit: Payer: Self-pay | Admitting: Family Medicine

## 2013-12-16 NOTE — Telephone Encounter (Signed)
Received refill request electronically from pharmacy. Last office visit 05/07/13. Patient has an appointment scheduled 12/27/13. Per refill protocol, is it okay to refill medication?

## 2013-12-16 NOTE — Telephone Encounter (Signed)
done

## 2013-12-16 NOTE — Telephone Encounter (Signed)
Please refill times one  

## 2013-12-26 ENCOUNTER — Ambulatory Visit: Payer: Self-pay | Admitting: Orthopedic Surgery

## 2013-12-26 NOTE — Progress Notes (Signed)
Preoperative surgical orders have been place into the Epic hospital system for [name] [name] on 12/26/2013, 6:18 PM  by Mickel Crow for surgery on 01-27-2014.  Preop Total Knee orders including Experal, IV Tylenol, and IV Decadron as long as there are no contraindications to the above medications. Arlee Muslim, PA-C

## 2013-12-27 ENCOUNTER — Encounter: Payer: Self-pay | Admitting: Family Medicine

## 2013-12-27 ENCOUNTER — Ambulatory Visit (INDEPENDENT_AMBULATORY_CARE_PROVIDER_SITE_OTHER): Payer: Medicare Other | Admitting: Family Medicine

## 2013-12-27 VITALS — BP 130/76 | HR 75 | Temp 97.8°F | Ht 63.5 in | Wt 204.0 lb

## 2013-12-27 DIAGNOSIS — E559 Vitamin D deficiency, unspecified: Secondary | ICD-10-CM

## 2013-12-27 DIAGNOSIS — E785 Hyperlipidemia, unspecified: Secondary | ICD-10-CM | POA: Diagnosis not present

## 2013-12-27 DIAGNOSIS — Z01818 Encounter for other preprocedural examination: Secondary | ICD-10-CM | POA: Diagnosis not present

## 2013-12-27 DIAGNOSIS — M8580 Other specified disorders of bone density and structure, unspecified site: Secondary | ICD-10-CM | POA: Diagnosis not present

## 2013-12-27 DIAGNOSIS — R5382 Chronic fatigue, unspecified: Secondary | ICD-10-CM

## 2013-12-27 LAB — COMPREHENSIVE METABOLIC PANEL
ALT: 18 U/L (ref 0–35)
AST: 22 U/L (ref 0–37)
Albumin: 4.2 g/dL (ref 3.5–5.2)
Alkaline Phosphatase: 62 U/L (ref 39–117)
BUN: 14 mg/dL (ref 6–23)
CALCIUM: 9.4 mg/dL (ref 8.4–10.5)
CO2: 26 meq/L (ref 19–32)
CREATININE: 0.9 mg/dL (ref 0.4–1.2)
Chloride: 104 mEq/L (ref 96–112)
GFR: 66.24 mL/min (ref >60.00)
Glucose, Bld: 96 mg/dL (ref 70–99)
Potassium: 4.4 mEq/L (ref 3.5–5.1)
SODIUM: 136 meq/L (ref 135–145)
TOTAL PROTEIN: 7.1 g/dL (ref 6.0–8.3)
Total Bilirubin: 0.5 mg/dL (ref 0.2–1.2)

## 2013-12-27 LAB — CBC WITH DIFFERENTIAL/PLATELET
BASOS ABS: 0 10*3/uL (ref 0.0–0.1)
Basophils Relative: 0.8 % (ref 0.0–3.0)
Eosinophils Absolute: 0.1 10*3/uL (ref 0.0–0.7)
Eosinophils Relative: 2.1 % (ref 0.0–5.0)
HCT: 42.8 % (ref 36.0–46.0)
Hemoglobin: 13.9 g/dL (ref 12.0–15.0)
LYMPHS PCT: 33.8 % (ref 12.0–46.0)
Lymphs Abs: 1.8 10*3/uL (ref 0.7–4.0)
MCHC: 32.5 g/dL (ref 30.0–36.0)
MCV: 88.3 fl (ref 78.0–100.0)
MONOS PCT: 8.1 % (ref 3.0–12.0)
Monocytes Absolute: 0.4 10*3/uL (ref 0.1–1.0)
Neutro Abs: 3 10*3/uL (ref 1.4–7.7)
Neutrophils Relative %: 55.2 % (ref 43.0–77.0)
PLATELETS: 277 10*3/uL (ref 150.0–400.0)
RBC: 4.84 Mil/uL (ref 3.87–5.11)
RDW: 14.4 % (ref 11.5–15.5)
WBC: 5.4 10*3/uL (ref 4.0–10.5)

## 2013-12-27 LAB — LIPID PANEL
CHOL/HDL RATIO: 2
Cholesterol: 179 mg/dL (ref 0–200)
HDL: 79.6 mg/dL (ref >39.00)
LDL Cholesterol: 79 mg/dL (ref 0–99)
NONHDL: 99.4
Triglycerides: 100 mg/dL (ref 0.0–149.0)
VLDL: 20 mg/dL (ref 0.0–40.0)

## 2013-12-27 LAB — TSH: TSH: 2.46 u[IU]/mL (ref 0.35–4.50)

## 2013-12-27 LAB — VITAMIN D 25 HYDROXY (VIT D DEFICIENCY, FRACTURES): VITD: 47.73 ng/mL (ref 30.00–100.00)

## 2013-12-27 NOTE — Patient Instructions (Signed)
Labs today I will send forms to orthopedics -good luck with your surgery  Make sure to let the anesthesiologist know about nausea in the past with surgery  Hold your plavix for as long as Dr Maureen Ralphs tells you to  Hold evista from surgical date to 2 weeks after  (it can increase risk of blood clots)  Schedule annual exam in the spring (no labs needed)

## 2013-12-27 NOTE — Progress Notes (Signed)
Subjective:    Patient ID: [name] [name], female    DOB: [birth year], 79 y.o.   MRN: [id number]  HPI Here for visit for medical pre operative clearance for left total knee replacement with Dr Maureen Ralphs Jan 11th is the surgery date  Her OA is very bad  She walks with a cane (left it in the car)- has to be very careful   Had cardiology visit in June  Was aware that she needed surgery    Wt is up 3 lb with bmi of 39- had to quit walking and exercising   BP Readings from Last 3 Encounters:  12/27/13 130/76  08/15/13 120/88  05/07/13 142/78    Med all incl latex and celebrex   For pain -usually takes hydrocodone  Oxycodone only for severe pain  Tolerates both of these   Was nauseated after her last surgery  Did vomit after anesthesia   imms utd  Never smoked  Cardiac status   Hx of CVA-no further symptoms  She has occ short term forgetfulness (names)   plavix- will hold before surgery  evista - I recommend to hold with 2 weeks after surgery   Saw Dr Burt Knack in July- was cleared for surgery (cardiology) at that time   Due for labs    Patient Active Problem List   Diagnosis Date Noted  . Pre-operative examination 12/27/2013  . Dysphagia 10/17/2011  . OA (osteoarthritis) of knee 08/16/2011  . Hemorrhoids 08/10/2011  . Other dyspnea and respiratory abnormality 06/21/2010  . Urinary incontinence 05/27/2010  . OVERACTIVE BLADDER 03/12/2010  . DEGENERATIVE DISC DISEASE 04/13/2009  . GERD 10/15/2008  . CEREBROVASCULAR ACCIDENT, HX OF 10/15/2008  . ARTHRITIS, GENERALIZED 06/26/2008  . EXTERNAL HEMORRHOIDS 02/01/2008  . HIATAL HERNIA 02/01/2008  . DIVERTICULOSIS OF COLON 02/01/2008  . GASTRITIS, HX OF 02/01/2008  . Vitamin D deficiency 07/03/2007  . Osteopenia 06/13/2007  . Fatigue 06/13/2007  . HELICOBACTER PYLORI INFECTION 06/06/2006  . Hyperlipidemia 06/06/2006  . ANXIETY 06/06/2006  . DEPRESSION 06/06/2006  . MACULAR DEGENERATION 06/06/2006  . CVA  06/06/2006  . OSTEOARTHRITIS 06/06/2006  . PALPITATIONS 05/25/2006   Past Medical History  Diagnosis Date  . Painful respiration   . Personal history of unspecified circulatory disease   . Palpitations   . Other and unspecified hyperlipidemia   . Esophageal reflux   . Depressive disorder, not elsewhere classified   . Anxiety state, unspecified   . Unspecified arthropathy, multiple sites   . Diaphragmatic hernia without mention of obstruction or gangrene   . External hemorrhoids without mention of complication   . Diverticulosis of colon (without mention of hemorrhage)   . Diarrhea   . Special screening for malignant neoplasms of other sites   . Unspecified vitamin D deficiency   . Other malaise and fatigue 08-11-11    hx. "chronic fatigue syndrome"  . Disorder of bone and cartilage, unspecified   . Helicobacter pylori (H. pylori)   . Macular degeneration (senile) of retina, unspecified   . Shortness of breath 08-11-11    with exertion only at present  . Unspecified cerebral artery occlusion with cerebral infarction 08-11-11    '97/ '06( TIA)-affected lt. side, no residual  . Gallstones 08-11-11    asymptomatic at present  . Personal history of other diseases of digestive system 08-11-11    "Irittable bowel syndrome"  . Urinary incontinence 08-11-11    wears peripad daily  . Osteoarthrosis, unspecified whether generalized or localized, unspecified site 08-11-11  hx. "rhematoid arthritis", osteoarthritis, DDD, bursitis(hip)  . Lumbar spondylosis    Past Surgical History  Procedure Laterality Date  . Appendectomy    . Tubal ligation    . Knee surgery  08-11-11    rt. knee scope  . Cataract extraction  08-11-11    Bilateral  . Back surgery  08-11-11    '11-hx. lumbar fusion with retained hardware  . Abdominal hysterectomy  08-11-11  . Total knee arthroplasty  08/16/2011    Procedure: TOTAL KNEE ARTHROPLASTY;  Surgeon: Gearlean Alf, MD;  Location: [facility];  Service: Orthopedics;   Laterality: Right;   History  Substance Use Topics  . Smoking status: Never Smoker   . Smokeless tobacco: Never Used  . Alcohol Use: No   Family History  Problem Relation Age of Onset  . Heart disease Father   . Diabetes Sister   . Coronary artery disease Brother   . Diabetes Brother   . Heart disease Brother   . Pancreatic cancer Daughter   . Colon cancer Neg Hx    Allergies  Allergen Reactions  . Alendronate Sodium     REACTION: JAW PAIN  . Celecoxib     REACTION: GI UPSET  . Latex    Current Outpatient Prescriptions on File Prior to Visit  Medication Sig Dispense Refill  . ALPRAZolam (XANAX) 0.5 MG tablet Take 1 tablet (0.5 mg total) by mouth at bedtime as needed for sleep. 90 tablet 1  . atorvastatin (LIPITOR) 10 MG tablet TAKE 1 TABLET DAILY 90 tablet 0  . clopidogrel (PLAVIX) 75 MG tablet TAKE 1 TABLET DAILY 90 tablet 1  . cyclobenzaprine (FLEXERIL) 10 MG tablet Take 10 mg by mouth 3 (three) times daily as needed for muscle spasms.    Marland Kitchen HYDROcodone-acetaminophen (NORCO/VICODIN) 5-325 MG per tablet Take 1-2 tablets every 6 hours as needed for severe pain 12 tablet 0  . NEXIUM 40 MG capsule TAKE 1 CAPSULE TWICE A DAY 180 capsule 1  . oxyCODONE-acetaminophen (PERCOCET/ROXICET) 5-325 MG per tablet Take by mouth. TAKE 1 TO 2 TABS EVERY 6 HRS PRN FOR PAIN    . raloxifene (EVISTA) 60 MG tablet TAKE 1 TABLET DAILY 90 tablet 0  . sertraline (ZOLOFT) 100 MG tablet TAKE 2 TABLETS DAILY 180 tablet 0   No current facility-administered medications on file prior to visit.    Review of Systems Review of Systems  Constitutional: Negative for fever, appetite change, fatigue and unexpected weight change.  Eyes: Negative for pain and visual disturbance.  Respiratory: Negative for cough and shortness of breath.   Cardiovascular: Negative for cp or palpitations    Gastrointestinal: Negative for nausea, diarrhea and constipation.  Genitourinary: Negative for urgency and frequency.    Skin: Negative for pallor or rash   MSK pos for joint pain  Neurological: Negative for weakness, light-headedness, numbness and headaches.  Hematological: Negative for adenopathy. Does not bruise/bleed easily.  Psychiatric/Behavioral: Negative for dysphoric mood. The patient is not nervous/anxious.         Objective:   Physical Exam  Constitutional: She appears well-developed and well-nourished. No distress.  obese and well appearing   HENT:  Head: Normocephalic and atraumatic.  Right Ear: External ear normal.  Left Ear: External ear normal.  Nose: Nose normal.  Mouth/Throat: Oropharynx is clear and moist.  Eyes: Conjunctivae and EOM are normal. Pupils are equal, round, and reactive to light. Right eye exhibits no discharge. Left eye exhibits no discharge. No scleral icterus.  Neck: Normal  range of motion. Neck supple. No JVD present. Carotid bruit is not present. No thyromegaly present.  Cardiovascular: Normal rate, regular rhythm, normal heart sounds and intact distal pulses.  Exam reveals no gallop.   Pulmonary/Chest: Effort normal and breath sounds normal. No respiratory distress. She has no wheezes. She has no rales.  Abdominal: Soft. Bowel sounds are normal. She exhibits no distension and no mass. There is no tenderness.  Musculoskeletal: She exhibits no edema or tenderness.  Lymphadenopathy:    She has no cervical adenopathy.  Neurological: She is alert. She has normal reflexes. No cranial nerve deficit. She exhibits normal muscle tone. Coordination normal.  Skin: Skin is warm and dry. No rash noted. No erythema. No pallor.  Psychiatric: She has a normal mood and affect.          Assessment & Plan:   Problem List Items Addressed This Visit      Musculoskeletal and Integument   Osteopenia     Other   Fatigue    Lab today Suspect this has worsened after inactivity (due to back and knee)      Relevant Orders      Comprehensive metabolic panel (Completed)       TSH (Completed)   Hyperlipidemia    Statin and diet  Lab today F/u annual exam in spring     Relevant Orders      CBC with Differential (Completed)      Lipid panel (Completed)   Pre-operative examination - Primary    For total knee Dr Burt Knack cleared in July- note and EKG sent  Will hold plavix as directed  I want her to hold evista for surgery and 2 wk after (due to inc risk of clots with it) Rev allergies  She will disc anesthesia nausea with the doctor at the [facility] before surgery   imms up to date  No medical restrictions for surgery     Vitamin D deficiency    Level today Disc imp to bone and general health Will f/u for annual exam in the spring     Relevant Orders      Vit D  25 hydroxy (rtn osteoporosis monitoring) (Completed)

## 2013-12-27 NOTE — Assessment & Plan Note (Signed)
Level today Disc imp to bone and general health Will f/u for annual exam in the spring

## 2013-12-27 NOTE — Assessment & Plan Note (Signed)
Lab today Suspect this has worsened after inactivity (due to back and knee)

## 2013-12-27 NOTE — Progress Notes (Signed)
Pre visit review using our clinic review tool, if applicable. No additional management support is needed unless otherwise documented below in the visit note. 

## 2013-12-27 NOTE — Assessment & Plan Note (Signed)
For total knee Dr Burt Knack cleared in July- note and EKG sent  Will hold plavix as directed  I want her to hold evista for surgery and 2 wk after (due to inc risk of clots with it) Rev allergies  She will disc anesthesia nausea with the doctor at the [facility] before surgery   imms up to date  No medical restrictions for surgery

## 2013-12-27 NOTE — Assessment & Plan Note (Signed)
Statin and diet  Lab today F/u annual exam in spring

## 2013-12-31 ENCOUNTER — Encounter: Payer: Medicare Other | Admitting: Family Medicine

## 2014-01-15 ENCOUNTER — Other Ambulatory Visit: Payer: Self-pay | Admitting: Family Medicine

## 2014-01-16 NOTE — Patient Instructions (Addendum)
[facility]  01/16/2014   Your procedure is scheduled on: 01/27/2014    Report to [facility]  Entrance and follow signs to               [place] at     [place] AM.  Call this number if you have problems the morning of surgery [phone]   Remember:  Do not eat food or drink liquids :After Midnight.     Take these medicines the morning of surgery with A SIP OF WATER: Nexium, Hydrocodone if needed, Zoloft                                You may not have any metal on your body including hair pins and              piercings  Do not wear jewelry, make-up, lotions, powders or perfumes.             Do not wear nail polish.  Do not shave  48 hours prior to surgery.                Do not bring valuables to the hospital. [place].  Contacts, dentures or bridgework may not be worn into surgery.  Leave suitcase in the car. After surgery it may be brought to your room.       Special Instructions: coughing and deep breathing exercises, leg exercises               Please read over the following fact sheets you were given: _____________________________________________________________________             [facility] - Preparing for Surgery Before surgery, you can play an important role.  Because skin is not sterile, your skin needs to be as free of germs as possible.  You can reduce the number of germs on your skin by washing with CHG (chlorahexidine gluconate) soap before surgery.  CHG is an antiseptic cleaner which kills germs and bonds with the skin to continue killing germs even after washing. Please DO NOT use if you have an allergy to CHG or antibacterial soaps.  If your skin becomes reddened/irritated stop using the CHG and inform your nurse when you arrive at Short Stay. Do not shave (including legs and underarms) for at least 48 hours prior to the first CHG shower.  You may shave your  face/neck. Please follow these instructions carefully:  1.  Shower with CHG Soap the night before surgery and the  morning of Surgery.  2.  If you choose to wash your hair, wash your hair first as usual with your  normal  shampoo.  3.  After you shampoo, rinse your hair and body thoroughly to remove the  shampoo.                           4.  Use CHG as you would any other liquid soap.  You can apply chg directly  to the skin and wash                       Gently with a scrungie or clean washcloth.  5.  Apply  the CHG Soap to your body ONLY FROM THE NECK DOWN.   Do not use on face/ open                           Wound or open sores. Avoid contact with eyes, ears mouth and genitals (private parts).                       Wash face,  Genitals (private parts) with your normal soap.             6.  Wash thoroughly, paying special attention to the area where your surgery  will be performed.  7.  Thoroughly rinse your body with warm water from the neck down.  8.  DO NOT shower/wash with your normal soap after using and rinsing off  the CHG Soap.                9.  Pat yourself dry with a clean towel.            10.  Wear clean pajamas.            11.  Place clean sheets on your bed the night of your first shower and do not  sleep with pets. Day of Surgery : Do not apply any lotions/deodorants the morning of surgery.  Please wear clean clothes to the hospital/surgery center.  FAILURE TO FOLLOW THESE INSTRUCTIONS MAY RESULT IN THE CANCELLATION OF YOUR SURGERY PATIENT SIGNATURE_________________________________  NURSE SIGNATURE__________________________________  ________________________________________________________________________  WHAT IS A BLOOD TRANSFUSION? Blood Transfusion Information  A transfusion is the replacement of blood or some of its parts. Blood is made up of multiple cells which provide different functions.  Red blood cells carry oxygen and are used for blood loss  replacement.  White blood cells fight against infection.  Platelets control bleeding.  Plasma helps clot blood.  Other blood products are available for specialized needs, such as hemophilia or other clotting disorders. BEFORE THE TRANSFUSION  Who gives blood for transfusions?   Healthy volunteers who are fully evaluated to make sure their blood is safe. This is blood bank blood. Transfusion therapy is the safest it has ever been in the practice of medicine. Before blood is taken from a donor, a complete history is taken to make sure that person has no history of diseases nor engages in risky social behavior (examples are intravenous drug use or sexual activity with multiple partners). The donor's travel history is screened to minimize risk of transmitting infections, such as malaria. The donated blood is tested for signs of infectious diseases, such as HIV and hepatitis. The blood is then tested to be sure it is compatible with you in order to minimize the chance of a transfusion reaction. If you or a relative donates blood, this is often done in anticipation of surgery and is not appropriate for emergency situations. It takes many days to process the donated blood. RISKS AND COMPLICATIONS Although transfusion therapy is very safe and saves many lives, the main dangers of transfusion include:  1. Getting an infectious disease. 2. Developing a transfusion reaction. This is an allergic reaction to something in the blood you were given. Every precaution is taken to prevent this. The decision to have a blood transfusion has been considered carefully by your caregiver before blood is given. Blood is not given unless the benefits outweigh the risks. AFTER THE TRANSFUSION  Right after receiving a blood  transfusion, you will usually feel much better and more energetic. This is especially true if your red blood cells have gotten low (anemic). The transfusion raises the level of the red blood cells which  carry oxygen, and this usually causes an energy increase.  The nurse administering the transfusion will monitor you carefully for complications. HOME CARE INSTRUCTIONS  No special instructions are needed after a transfusion. You may find your energy is better. Speak with your caregiver about any limitations on activity for underlying diseases you may have. SEEK MEDICAL CARE IF:   Your condition is not improving after your transfusion.  You develop redness or irritation at the intravenous (IV) site. SEEK IMMEDIATE MEDICAL CARE IF:  Any of the following symptoms occur over the next 12 hours:  Shaking chills.  You have a temperature by mouth above 102 F (38.9 C), not controlled by medicine.  Chest, back, or muscle pain.  People around you feel you are not acting correctly or are confused.  Shortness of breath or difficulty breathing.  Dizziness and fainting.  You get a rash or develop hives.  You have a decrease in urine output.  Your urine turns a dark color or changes to pink, red, or brown. Any of the following symptoms occur over the next 10 days:  You have a temperature by mouth above 102 F (38.9 C), not controlled by medicine.  Shortness of breath.  Weakness after normal activity.  The white part of the eye turns yellow (jaundice).  You have a decrease in the amount of urine or are urinating less often.  Your urine turns a dark color or changes to pink, red, or brown. Document Released: 01/01/2000 Document Revised: 03/28/2011 Document Reviewed: 08/20/2007 ExitCare Patient Information 2014 [place].  _______________________________________________________________________  Incentive Spirometer  An incentive spirometer is a tool that can help keep your lungs clear and active. This tool measures how well you are filling your lungs with each breath. Taking long deep breaths may help reverse or decrease the chance of developing breathing (pulmonary) problems  (especially infection) following:  A long period of time when you are unable to move or be active. BEFORE THE PROCEDURE   If the spirometer includes an indicator to show your best effort, your nurse or respiratory therapist will set it to a desired goal.  If possible, sit up straight or lean slightly forward. Try not to slouch.  Hold the incentive spirometer in an upright position. INSTRUCTIONS FOR USE  3. Sit on the edge of your bed if possible, or sit up as far as you can in bed or on a chair. 4. Hold the incentive spirometer in an upright position. 5. Breathe out normally. 6. Place the mouthpiece in your mouth and seal your lips tightly around it. 7. Breathe in slowly and as deeply as possible, raising the piston or the ball toward the top of the column. 8. Hold your breath for 3-5 seconds or for as long as possible. Allow the piston or ball to fall to the bottom of the column. 9. Remove the mouthpiece from your mouth and breathe out normally. 10. Rest for a few seconds and repeat Steps 1 through 7 at least 10 times every 1-2 hours when you are awake. Take your time and take a few normal breaths between deep breaths. 11. The spirometer may include an indicator to show your best effort. Use the indicator as a goal to work toward during each repetition. 12. After each set of 10 deep  breaths, practice coughing to be sure your lungs are clear. If you have an incision (the cut made at the time of surgery), support your incision when coughing by placing a pillow or rolled up towels firmly against it. Once you are able to get out of bed, walk around indoors and cough well. You may stop using the incentive spirometer when instructed by your caregiver.  RISKS AND COMPLICATIONS  Take your time so you do not get dizzy or light-headed.  If you are in pain, you may need to take or ask for pain medication before doing incentive spirometry. It is harder to take a deep breath if you are having  pain. AFTER USE  Rest and breathe slowly and easily.  It can be helpful to keep track of a log of your progress. Your caregiver can provide you with a simple table to help with this. If you are using the spirometer at home, follow these instructions: [place] IF:   You are having difficultly using the spirometer.  You have trouble using the spirometer as often as instructed.  Your pain medication is not giving enough relief while using the spirometer.  You develop fever of 100.5 F (38.1 C) or higher. SEEK IMMEDIATE MEDICAL CARE IF:   You cough up bloody sputum that had not been present before.  You develop fever of 102 F (38.9 C) or greater.  You develop worsening pain at or near the incision site. MAKE SURE YOU:   Understand these instructions.  Will watch your condition.  Will get help right away if you are not doing well or get worse. Document Released: 05/16/2006 Document Revised: 03/28/2011 Document Reviewed: 07/17/2006 [facility] Patient Information 2014 [place], [place].   ________________________________________________________________________

## 2014-01-20 ENCOUNTER — Encounter (HOSPITAL_COMMUNITY)
Admission: RE | Admit: 2014-01-20 | Discharge: 2014-01-20 | Disposition: A | Discharge status: Home / Self Care | Payer: Medicare Other | Source: Ambulatory Visit | Attending: Orthopedic Surgery | Admitting: Orthopedic Surgery

## 2014-01-20 ENCOUNTER — Encounter (HOSPITAL_COMMUNITY): Payer: Self-pay

## 2014-01-20 ENCOUNTER — Ambulatory Visit (HOSPITAL_COMMUNITY)
Admission: RE | Admit: 2014-01-20 | Discharge: 2014-01-20 | Disposition: A | Discharge status: Home / Self Care | Payer: Medicare Other | Source: Ambulatory Visit | Attending: Orthopedic Surgery | Admitting: Orthopedic Surgery

## 2014-01-20 DIAGNOSIS — M1712 Unilateral primary osteoarthritis, left knee: Secondary | ICD-10-CM | POA: Diagnosis not present

## 2014-01-20 DIAGNOSIS — Z01818 Encounter for other preprocedural examination: Secondary | ICD-10-CM | POA: Diagnosis not present

## 2014-01-20 DIAGNOSIS — Z01812 Encounter for preprocedural laboratory examination: Secondary | ICD-10-CM | POA: Diagnosis not present

## 2014-01-20 DIAGNOSIS — Z79899 Other long term (current) drug therapy: Secondary | ICD-10-CM | POA: Insufficient documentation

## 2014-01-20 DIAGNOSIS — Z8673 Personal history of transient ischemic attack (TIA), and cerebral infarction without residual deficits: Secondary | ICD-10-CM | POA: Insufficient documentation

## 2014-01-20 DIAGNOSIS — I517 Cardiomegaly: Secondary | ICD-10-CM | POA: Diagnosis not present

## 2014-01-20 HISTORY — DX: Adverse effect of unspecified anesthetic, initial encounter: T41.45XA

## 2014-01-20 HISTORY — DX: Other specified postprocedural states: Z98.890

## 2014-01-20 HISTORY — DX: Other complications of anesthesia, initial encounter: T88.59XA

## 2014-01-20 HISTORY — DX: Nausea with vomiting, unspecified: R11.2

## 2014-01-20 LAB — CBC
HCT: 43 % (ref 36.0–46.0)
Hemoglobin: 13.5 g/dL (ref 12.0–15.0)
MCH: 28.7 pg (ref 26.0–34.0)
MCHC: 31.4 g/dL (ref 30.0–36.0)
MCV: 91.3 fL (ref 78.0–100.0)
PLATELETS: 293 10*3/uL (ref 150–400)
RBC: 4.71 MIL/uL (ref 3.87–5.11)
RDW: 13.9 % (ref 11.5–15.5)
WBC: 6.2 10*3/uL (ref 4.0–10.5)

## 2014-01-20 LAB — COMPREHENSIVE METABOLIC PANEL
ALT: 17 U/L (ref 0–35)
AST: 29 U/L (ref 0–37)
Albumin: 4.7 g/dL (ref 3.5–5.2)
Alkaline Phosphatase: 77 U/L (ref 39–117)
Anion gap: 6 (ref 5–15)
BUN: 16 mg/dL (ref 6–23)
CO2: 27 mmol/L (ref 19–32)
Calcium: 9.3 mg/dL (ref 8.4–10.5)
Chloride: 106 mEq/L (ref 96–112)
Creatinine, Ser: 1.14 mg/dL — ABNORMAL HIGH (ref 0.50–1.10)
GFR, EST AFRICAN AMERICAN: 52 mL/min — AB (ref >90)
GFR, EST NON AFRICAN AMERICAN: 45 mL/min — AB (ref >90)
GLUCOSE: 97 mg/dL (ref 70–99)
POTASSIUM: 4.8 mmol/L (ref 3.5–5.1)
SODIUM: 139 mmol/L (ref 135–145)
Total Bilirubin: 0.6 mg/dL (ref 0.3–1.2)
Total Protein: 7.6 g/dL (ref 6.0–8.3)

## 2014-01-20 LAB — PROTIME-INR
INR: 0.98 (ref 0.00–1.49)
Prothrombin Time: 13.1 seconds (ref 11.6–15.2)

## 2014-01-20 LAB — URINALYSIS, ROUTINE W REFLEX MICROSCOPIC
BILIRUBIN URINE: NEGATIVE
Glucose, UA: NEGATIVE mg/dL
Hgb urine dipstick: NEGATIVE
Ketones, ur: NEGATIVE mg/dL
LEUKOCYTES UA: NEGATIVE
NITRITE: NEGATIVE
Protein, ur: NEGATIVE mg/dL
SPECIFIC GRAVITY, URINE: 1.026 (ref 1.005–1.030)
UROBILINOGEN UA: 0.2 mg/dL (ref 0.0–1.0)
pH: 5 (ref 5.0–8.0)

## 2014-01-20 LAB — SURGICAL PCR SCREEN
MRSA, PCR: NEGATIVE
Staphylococcus aureus: NEGATIVE

## 2014-01-20 LAB — APTT: aPTT: 28 seconds (ref 24–37)

## 2014-01-20 NOTE — Progress Notes (Signed)
EKG-08/15/2013 EPIC  LOV with Dr Burt Knack -08/15/13 EPIC- clearance in note  Clearance Dr Glori Bickers- on chart

## 2014-01-20 NOTE — Progress Notes (Signed)
Consulted Dr. Lissa Hoard about patient's history -shown EKG, office note of 08/15/13 from Dr. Sherren Mocha and per Dr. Lissa Hoard, patient is OK for surgery.

## 2014-01-21 ENCOUNTER — Other Ambulatory Visit: Payer: Self-pay | Admitting: Family Medicine

## 2014-01-21 NOTE — H&P (Signed)
TOTAL KNEE ADMISSION H&P  Patient is being admitted for left total knee arthroplasty.  Subjective:  Chief Complaint:left knee pain.  HPI: [name] [name], 80 y.o. female, has a history of pain and functional disability in the left knee due to arthritis and has failed non-surgical conservative treatments for greater than 12 weeks to includeNSAID's and/or analgesics, corticosteriod injections and activity modification.  Onset of symptoms was gradual, starting 5 years ago with gradually worsening course since that time. The patient noted no past surgery on the left knee(s).  Patient currently rates pain in the left knee(s) at 7 out of 10 with activity. Patient has night pain, worsening of pain with activity and weight bearing, pain that interferes with activities of daily living, pain with passive range of motion, crepitus and joint swelling.  Patient has evidence of periarticular osteophytes and joint space narrowing by imaging studies. There is no active infection.  Patient Active Problem List   Diagnosis Date Noted  . Pre-operative examination 12/27/2013  . Dysphagia 10/17/2011  . OA (osteoarthritis) of knee 08/16/2011  . Hemorrhoids 08/10/2011  . Other dyspnea and respiratory abnormality 06/21/2010  . Urinary incontinence 05/27/2010  . OVERACTIVE BLADDER 03/12/2010  . DEGENERATIVE DISC DISEASE 04/13/2009  . GERD 10/15/2008  . CEREBROVASCULAR ACCIDENT, HX OF 10/15/2008  . ARTHRITIS, GENERALIZED 06/26/2008  . EXTERNAL HEMORRHOIDS 02/01/2008  . HIATAL HERNIA 02/01/2008  . DIVERTICULOSIS OF COLON 02/01/2008  . GASTRITIS, HX OF 02/01/2008  . Vitamin D deficiency 07/03/2007  . Osteopenia 06/13/2007  . Fatigue 06/13/2007  . HELICOBACTER PYLORI INFECTION 06/06/2006  . Hyperlipidemia 06/06/2006  . ANXIETY 06/06/2006  . DEPRESSION 06/06/2006  . MACULAR DEGENERATION 06/06/2006  . CVA 06/06/2006  . OSTEOARTHRITIS 06/06/2006  . PALPITATIONS 05/25/2006   Past Medical History  Diagnosis  Date  . Painful respiration   . Personal history of unspecified circulatory disease   . Palpitations   . Other and unspecified hyperlipidemia   . Esophageal reflux   . Depressive disorder, not elsewhere classified   . Anxiety state, unspecified   . Unspecified arthropathy, multiple sites   . Diaphragmatic hernia without mention of obstruction or gangrene   . External hemorrhoids without mention of complication   . Diverticulosis of colon (without mention of hemorrhage)   . Diarrhea   . Special screening for malignant neoplasms of other sites   . Unspecified vitamin D deficiency   . Other malaise and fatigue 08-11-11    hx. "chronic fatigue syndrome"  . Disorder of bone and cartilage, unspecified   . Helicobacter pylori (H. pylori)   . Macular degeneration (senile) of retina, unspecified   . Shortness of breath 08-11-11    with exertion only at present  . Unspecified cerebral artery occlusion with cerebral infarction 08-11-11    '97/ '06( TIA)-affected lt. side, no residual  . Gallstones 08-11-11    asymptomatic at present  . Personal history of other diseases of digestive system 08-11-11    "Irittable bowel syndrome"  . Urinary incontinence 08-11-11    wears peripad daily  . Osteoarthrosis, unspecified whether generalized or localized, unspecified site 08-11-11    hx. "rhematoid arthritis", osteoarthritis, DDD, bursitis(hip)  . Lumbar spondylosis   . Complication of anesthesia     slow to wake up with surgery several years ago   . PONV (postoperative nausea and vomiting)     Past Surgical History  Procedure Laterality Date  . Appendectomy    . Tubal ligation    . Knee surgery  08-11-11  rt. knee scope  . Cataract extraction  08-11-11    Bilateral  . Back surgery  08-11-11    '11-hx. lumbar fusion with retained hardware  . Abdominal hysterectomy  08-11-11  . Total knee arthroplasty  08/16/2011    Procedure: TOTAL KNEE ARTHROPLASTY;  Surgeon: Gearlean Alf, MD;  Location: [facility]  [facility];  Service: Orthopedics;  Laterality: Right;     Current outpatient prescriptions:  ALPRAZolam (XANAX) 0.5 MG tablet, Take 1 tablet (0.5 mg total) by mouth at bedtime as needed for sleep., Disp: 90 tablet, Rfl: 1;   atorvastatin (LIPITOR) 10 MG tablet, Take 10 mg by mouth daily., Disp: , Rfl: ;   clopidogrel (PLAVIX) 75 MG tablet, Take 75 mg by mouth every evening., Disp: , Rfl:  cyclobenzaprine (FLEXERIL) 10 MG tablet, Take 10 mg by mouth 3 (three) times daily as needed for muscle spasms., Disp: , Rfl: ;   esomeprazole (NEXIUM) 40 MG capsule, Take 40 mg by mouth 2 (two) times daily before a meal., Disp: , Rfl: ;   HYDROcodone-acetaminophen (NORCO) 10-325 MG per tablet, Take 1 tablet by mouth every 6 (six) hours as needed for moderate pain., Disp: , Rfl:  raloxifene (EVISTA) 60 MG tablet, Take 60 mg by mouth daily., Disp: , Rfl: ;   sertraline (ZOLOFT) 100 MG tablet, Take 100 mg by mouth 2 (two) times daily., Disp: , Rfl: ;    clopidogrel (PLAVIX) 75 MG tablet, TAKE 1 TABLET DAILY, Disp: 90 tablet, Rfl: 0 sulfamethoxazole-trimethoprim (BACTRIM DS,SEPTRA DS) 800-160 MG per tablet, Take 1 tablet by mouth daily. Patient takes in pm, Disp: , Rfl:   Allergies  Allergen Reactions  . Alendronate Sodium     REACTION: JAW PAIN  . Celecoxib     REACTION: GI UPSET  . Latex     History  Substance Use Topics  . Smoking status: Never Smoker   . Smokeless tobacco: Never Used  . Alcohol Use: No    Family History  Problem Relation Age of Onset  . Heart disease Father   . Diabetes Sister   . Coronary artery disease Brother   . Diabetes Brother   . Heart disease Brother   . Pancreatic cancer Daughter   . Colon cancer Neg Hx      Review of Systems  Constitutional: Negative.   HENT: Negative.   Eyes: Negative.   Respiratory: Negative.   Cardiovascular: Negative.   Gastrointestinal: Positive for nausea and diarrhea. Negative for heartburn, vomiting, abdominal pain, constipation, blood in  stool and melena.  Genitourinary: Positive for frequency. Negative for dysuria, urgency, hematuria and flank pain.  Musculoskeletal: Positive for back pain and joint pain. Negative for myalgias, falls and neck pain.       Left knee pain  Skin: Positive for rash. Negative for itching.  Neurological: Negative.   Endo/Heme/Allergies: Negative.   Psychiatric/Behavioral: Positive for memory loss. Negative for depression, suicidal ideas, hallucinations and substance abuse. The patient is not nervous/anxious and does not have insomnia.     Objective:  Physical Exam  Constitutional: She is oriented to person, place, and time. She appears well-developed. No distress.  Obese  HENT:  Head: Normocephalic and atraumatic.  Right Ear: External ear normal.  Left Ear: External ear normal.  Nose: Nose normal.  Mouth/Throat: Oropharynx is clear and moist.  Eyes: Conjunctivae and EOM are normal.  Neck: Normal range of motion. Neck supple.  Cardiovascular: Normal rate, regular rhythm, normal heart sounds and intact distal pulses.   No murmur  heard. Respiratory: Effort normal and breath sounds normal. No respiratory distress. She has no wheezes.  GI: Soft. Bowel sounds are normal. She exhibits no distension.  Musculoskeletal:       Right hip: Normal.       Left hip: Normal.       Right knee: Normal.       Left knee: She exhibits decreased range of motion, swelling and effusion. She exhibits no erythema. Tenderness found. Medial joint line and lateral joint line tenderness noted.       Right lower leg: She exhibits no tenderness and no swelling.       Left lower leg: She exhibits no tenderness and no swelling.  Right knee shows no swelling. Range is 0-125 and no tenderness or instability. Left knee shows slight valgus, range 5-120. Marked crepitus on ROM. Tender lateral greater than medial.  Neurological: She is alert and oriented to person, place, and time. She has normal strength and normal  reflexes. No sensory deficit.  Skin: She is not diaphoretic. No erythema.  Psychiatric: She has a normal mood and affect. Her behavior is normal.    Vital signs in last 24 hours: Temp:  [97.5 F (36.4 C)] 97.5 F (36.4 C) (01/04 1343) Pulse Rate:  [95] 95 (01/04 1343) Resp:  [16] 16 (01/04 1343) BP: (133)/(76) 133/76 mmHg (01/04 1343) SpO2:  [99 %] 99 % (01/04 1343) Weight:  [91.627 kg (202 lb)] 91.627 kg (202 lb) (01/04 1343)  Labs:   Estimated body mass index is 35.11 kg/(m^2) as calculated from the following:   Height as of 08/15/13: 5' 3.5" (1.613 m).   Weight as of 08/15/13: 91.354 kg (201 lb 6.4 oz).   Imaging Review Plain radiographs demonstrate severe degenerative joint disease of the left knee(s). The overall alignment ismild valgus. The bone quality appears to be good for age and reported activity level.  Assessment/Plan:  End stage arthritis, left knee   The patient history, physical examination, clinical judgment of the provider and imaging studies are consistent with end stage degenerative joint disease of the left knee(s) and total knee arthroplasty is deemed medically necessary. The treatment options including medical management, injection therapy arthroscopy and arthroplasty were discussed at length. The risks and benefits of total knee arthroplasty were presented and reviewed. The risks due to aseptic loosening, infection, stiffness, patella tracking problems, thromboembolic complications and other imponderables were discussed. The patient acknowledged the explanation, agreed to proceed with the plan and consent was signed. Patient is being admitted for inpatient treatment for surgery, pain control, PT, OT, prophylactic antibiotics, VTE prophylaxis, progressive ambulation and ADL's and discharge planning. The patient is planning to be discharged to skilled nursing facility [facility])   Patient wants general anesthesia Concerned about severe post op nausea  following R TKA Topical TXA  Dr. Glori Bickers Dr. Chyrl Civatte, PA-C

## 2014-01-21 NOTE — Progress Notes (Signed)
Dr Rudean Curt ( anesthesiology) made aware of history of TIA in 1997 and Plavix.  Patient is on Plavix and has preop instructions from MD regarding this per patient.  LOV with Dr Burt Knack - 08/15/2013 in [place].  Clearance- DR tower on chart adn Clearance 08/15/2013 - Dr Burt Knack ( cardiology) - in note.  No further orders given.

## 2014-01-27 ENCOUNTER — Inpatient Hospital Stay (HOSPITAL_COMMUNITY)
Admission: RE | Admit: 2014-01-27 | Discharge: 2014-01-29 | DRG: 470 | Disposition: A | Discharge status: Skilled Nursing Facility | Payer: Medicare Other | Source: Ambulatory Visit | Attending: Orthopedic Surgery | Admitting: Orthopedic Surgery

## 2014-01-27 ENCOUNTER — Encounter (HOSPITAL_COMMUNITY)
Admission: RE | Disposition: A | Discharge status: Skilled Nursing Facility | Payer: Self-pay | Source: Ambulatory Visit | Attending: Orthopedic Surgery

## 2014-01-27 ENCOUNTER — Inpatient Hospital Stay (HOSPITAL_COMMUNITY): Payer: Medicare Other | Admitting: Certified Registered"

## 2014-01-27 ENCOUNTER — Encounter (HOSPITAL_COMMUNITY): Payer: Self-pay | Admitting: *Deleted

## 2014-01-27 DIAGNOSIS — M179 Osteoarthritis of knee, unspecified: Secondary | ICD-10-CM | POA: Diagnosis not present

## 2014-01-27 DIAGNOSIS — Z96651 Presence of right artificial knee joint: Secondary | ICD-10-CM | POA: Diagnosis present

## 2014-01-27 DIAGNOSIS — F411 Generalized anxiety disorder: Secondary | ICD-10-CM | POA: Diagnosis present

## 2014-01-27 DIAGNOSIS — M1710 Unilateral primary osteoarthritis, unspecified knee: Secondary | ICD-10-CM | POA: Diagnosis present

## 2014-01-27 DIAGNOSIS — E785 Hyperlipidemia, unspecified: Secondary | ICD-10-CM | POA: Diagnosis present

## 2014-01-27 DIAGNOSIS — Z9842 Cataract extraction status, left eye: Secondary | ICD-10-CM | POA: Diagnosis not present

## 2014-01-27 DIAGNOSIS — R682 Dry mouth, unspecified: Secondary | ICD-10-CM | POA: Diagnosis not present

## 2014-01-27 DIAGNOSIS — K219 Gastro-esophageal reflux disease without esophagitis: Secondary | ICD-10-CM | POA: Diagnosis present

## 2014-01-27 DIAGNOSIS — Z91040 Latex allergy status: Secondary | ICD-10-CM

## 2014-01-27 DIAGNOSIS — H3530 Unspecified macular degeneration: Secondary | ICD-10-CM | POA: Diagnosis present

## 2014-01-27 DIAGNOSIS — Z8673 Personal history of transient ischemic attack (TIA), and cerebral infarction without residual deficits: Secondary | ICD-10-CM

## 2014-01-27 DIAGNOSIS — D62 Acute posthemorrhagic anemia: Secondary | ICD-10-CM | POA: Diagnosis not present

## 2014-01-27 DIAGNOSIS — M47896 Other spondylosis, lumbar region: Secondary | ICD-10-CM | POA: Diagnosis present

## 2014-01-27 DIAGNOSIS — N3281 Overactive bladder: Secondary | ICD-10-CM | POA: Diagnosis present

## 2014-01-27 DIAGNOSIS — M6281 Muscle weakness (generalized): Secondary | ICD-10-CM | POA: Diagnosis not present

## 2014-01-27 DIAGNOSIS — E876 Hypokalemia: Secondary | ICD-10-CM | POA: Diagnosis not present

## 2014-01-27 DIAGNOSIS — Z888 Allergy status to other drugs, medicaments and biological substances status: Secondary | ICD-10-CM | POA: Diagnosis not present

## 2014-01-27 DIAGNOSIS — K5900 Constipation, unspecified: Secondary | ICD-10-CM | POA: Diagnosis not present

## 2014-01-27 DIAGNOSIS — Z96652 Presence of left artificial knee joint: Secondary | ICD-10-CM | POA: Diagnosis not present

## 2014-01-27 DIAGNOSIS — F329 Major depressive disorder, single episode, unspecified: Secondary | ICD-10-CM | POA: Diagnosis present

## 2014-01-27 DIAGNOSIS — M1712 Unilateral primary osteoarthritis, left knee: Principal | ICD-10-CM | POA: Diagnosis present

## 2014-01-27 DIAGNOSIS — D649 Anemia, unspecified: Secondary | ICD-10-CM | POA: Diagnosis not present

## 2014-01-27 DIAGNOSIS — M069 Rheumatoid arthritis, unspecified: Secondary | ICD-10-CM | POA: Diagnosis present

## 2014-01-27 DIAGNOSIS — F5105 Insomnia due to other mental disorder: Secondary | ICD-10-CM | POA: Diagnosis not present

## 2014-01-27 DIAGNOSIS — M25562 Pain in left knee: Secondary | ICD-10-CM | POA: Diagnosis not present

## 2014-01-27 DIAGNOSIS — Z9049 Acquired absence of other specified parts of digestive tract: Secondary | ICD-10-CM | POA: Diagnosis present

## 2014-01-27 DIAGNOSIS — M8580 Other specified disorders of bone density and structure, unspecified site: Secondary | ICD-10-CM | POA: Diagnosis present

## 2014-01-27 DIAGNOSIS — R5382 Chronic fatigue, unspecified: Secondary | ICD-10-CM | POA: Diagnosis present

## 2014-01-27 DIAGNOSIS — F419 Anxiety disorder, unspecified: Secondary | ICD-10-CM | POA: Diagnosis not present

## 2014-01-27 DIAGNOSIS — R278 Other lack of coordination: Secondary | ICD-10-CM | POA: Diagnosis not present

## 2014-01-27 DIAGNOSIS — Z471 Aftercare following joint replacement surgery: Secondary | ICD-10-CM | POA: Diagnosis not present

## 2014-01-27 DIAGNOSIS — Z87442 Personal history of urinary calculi: Secondary | ICD-10-CM | POA: Diagnosis not present

## 2014-01-27 DIAGNOSIS — Z9841 Cataract extraction status, right eye: Secondary | ICD-10-CM | POA: Diagnosis not present

## 2014-01-27 HISTORY — PX: TOTAL KNEE ARTHROPLASTY: SHX125

## 2014-01-27 LAB — TYPE AND SCREEN
ABO/RH(D): B POS
ANTIBODY SCREEN: NEGATIVE

## 2014-01-27 SURGERY — ARTHROPLASTY, KNEE, TOTAL
Anesthesia: General | Site: Knee | Laterality: Left

## 2014-01-27 MED ORDER — ONDANSETRON HCL 4 MG/2ML IJ SOLN
INTRAMUSCULAR | Status: AC
  Filled 2014-01-27: qty 2

## 2014-01-27 MED ORDER — OXYCODONE HCL 5 MG PO TABS
5.0000 mg | ORAL_TABLET | ORAL | Status: DC | PRN
  Administered 2014-01-27 – 2014-01-29 (×9): 10 mg via ORAL
  Filled 2014-01-27 (×9): qty 2

## 2014-01-27 MED ORDER — ACETAMINOPHEN 650 MG RE SUPP: 650.0000 mg | Freq: Four times a day (QID) | RECTAL | Status: DC | PRN

## 2014-01-27 MED ORDER — LACTATED RINGERS IV SOLN
INTRAVENOUS | Status: DC | PRN
  Administered 2014-01-27 (×2): via INTRAVENOUS

## 2014-01-27 MED ORDER — DEXAMETHASONE SODIUM PHOSPHATE 10 MG/ML IJ SOLN
INTRAMUSCULAR | Status: AC
  Filled 2014-01-27: qty 1

## 2014-01-27 MED ORDER — FLEET ENEMA 7-19 GM/118ML RE ENEM: 1.0000 | ENEMA | Freq: Once | RECTAL | Status: AC | PRN

## 2014-01-27 MED ORDER — ONDANSETRON HCL 4 MG PO TABS: 4.0000 mg | ORAL_TABLET | Freq: Four times a day (QID) | ORAL | Status: DC | PRN

## 2014-01-27 MED ORDER — DEXTROSE-NACL 5-0.9 % IV SOLN
INTRAVENOUS | Status: DC
  Administered 2014-01-27 – 2014-01-28 (×2): via INTRAVENOUS

## 2014-01-27 MED ORDER — ACETAMINOPHEN 500 MG PO TABS
1000.0000 mg | ORAL_TABLET | Freq: Four times a day (QID) | ORAL | Status: AC
  Administered 2014-01-27 – 2014-01-28 (×3): 1000 mg via ORAL
  Filled 2014-01-27 (×4): qty 2

## 2014-01-27 MED ORDER — CEFAZOLIN SODIUM-DEXTROSE 2-3 GM-% IV SOLR
INTRAVENOUS | Status: AC
  Filled 2014-01-27: qty 50

## 2014-01-27 MED ORDER — MORPHINE SULFATE 2 MG/ML IJ SOLN
1.0000 mg | INTRAMUSCULAR | Status: DC | PRN
  Administered 2014-01-27: 2 mg via INTRAVENOUS
  Filled 2014-01-27: qty 1

## 2014-01-27 MED ORDER — BUPIVACAINE HCL (PF) 0.25 % IJ SOLN
INTRAMUSCULAR | Status: AC
  Filled 2014-01-27: qty 30

## 2014-01-27 MED ORDER — CHLORHEXIDINE GLUCONATE 4 % EX LIQD: 60.0000 mL | Freq: Once | CUTANEOUS | Status: DC

## 2014-01-27 MED ORDER — METOCLOPRAMIDE HCL 5 MG/ML IJ SOLN: 5.0000 mg | Freq: Three times a day (TID) | INTRAMUSCULAR | Status: DC | PRN

## 2014-01-27 MED ORDER — LACTATED RINGERS IV SOLN
INTRAVENOUS | Status: DC
Start: 2014-01-27 — End: 2014-01-27

## 2014-01-27 MED ORDER — PROPOFOL 10 MG/ML IV BOLUS
INTRAVENOUS | Status: DC | PRN
  Administered 2014-01-27: 200 mg via INTRAVENOUS

## 2014-01-27 MED ORDER — PHENOL 1.4 % MT LIQD: 1.0000 | OROMUCOSAL | Status: DC | PRN

## 2014-01-27 MED ORDER — DIPHENHYDRAMINE HCL 12.5 MG/5ML PO ELIX: 12.5000 mg | ORAL_SOLUTION | ORAL | Status: DC | PRN

## 2014-01-27 MED ORDER — FENTANYL CITRATE 0.05 MG/ML IJ SOLN
INTRAMUSCULAR | Status: AC
  Filled 2014-01-27: qty 2

## 2014-01-27 MED ORDER — SODIUM CHLORIDE 0.9 % IR SOLN
Status: DC | PRN
  Administered 2014-01-27: 1000 mL

## 2014-01-27 MED ORDER — HYDROMORPHONE HCL 1 MG/ML IJ SOLN
INTRAMUSCULAR | Status: DC | PRN
  Administered 2014-01-27 (×3): 1 mg via INTRAVENOUS
  Administered 2014-01-27 (×2): 0.5 mg via INTRAVENOUS

## 2014-01-27 MED ORDER — PROPOFOL 10 MG/ML IV BOLUS
INTRAVENOUS | Status: AC
  Filled 2014-01-27: qty 20

## 2014-01-27 MED ORDER — MIDAZOLAM HCL 2 MG/2ML IJ SOLN
INTRAMUSCULAR | Status: AC
  Filled 2014-01-27: qty 2

## 2014-01-27 MED ORDER — LIDOCAINE HCL (CARDIAC) 20 MG/ML IV SOLN
INTRAVENOUS | Status: AC
  Filled 2014-01-27: qty 5

## 2014-01-27 MED ORDER — DEXAMETHASONE SODIUM PHOSPHATE 10 MG/ML IJ SOLN
10.0000 mg | Freq: Once | INTRAMUSCULAR | Status: AC
  Administered 2014-01-27: 10 mg via INTRAVENOUS

## 2014-01-27 MED ORDER — METHOCARBAMOL 500 MG PO TABS
500.0000 mg | ORAL_TABLET | Freq: Four times a day (QID) | ORAL | Status: DC | PRN
  Administered 2014-01-27 – 2014-01-28 (×4): 500 mg via ORAL
  Filled 2014-01-27 (×4): qty 1

## 2014-01-27 MED ORDER — BUPIVACAINE LIPOSOME 1.3 % IJ SUSP
20.0000 mL | Freq: Once | INTRAMUSCULAR | Status: DC
  Filled 2014-01-27 (×2): qty 20

## 2014-01-27 MED ORDER — ONDANSETRON HCL 4 MG/2ML IJ SOLN
INTRAMUSCULAR | Status: DC | PRN
  Administered 2014-01-27: 4 mg via INTRAVENOUS

## 2014-01-27 MED ORDER — SODIUM CHLORIDE 0.9 % IJ SOLN
INTRAMUSCULAR | Status: AC
  Filled 2014-01-27: qty 50

## 2014-01-27 MED ORDER — LIDOCAINE HCL (CARDIAC) 20 MG/ML IV SOLN
INTRAVENOUS | Status: DC | PRN
  Administered 2014-01-27: 50 mg via INTRAVENOUS

## 2014-01-27 MED ORDER — DOCUSATE SODIUM 100 MG PO CAPS
100.0000 mg | ORAL_CAPSULE | Freq: Two times a day (BID) | ORAL | Status: DC
  Administered 2014-01-27 – 2014-01-29 (×4): 100 mg via ORAL

## 2014-01-27 MED ORDER — ONDANSETRON HCL 4 MG/2ML IJ SOLN: 4.0000 mg | Freq: Four times a day (QID) | INTRAMUSCULAR | Status: DC | PRN

## 2014-01-27 MED ORDER — LACTATED RINGERS IV SOLN: INTRAVENOUS | Status: DC

## 2014-01-27 MED ORDER — ATORVASTATIN CALCIUM 10 MG PO TABS
10.0000 mg | ORAL_TABLET | Freq: Every day | ORAL | Status: DC
  Administered 2014-01-28 – 2014-01-29 (×2): 10 mg via ORAL
  Filled 2014-01-27 (×3): qty 1

## 2014-01-27 MED ORDER — FENTANYL CITRATE 0.05 MG/ML IJ SOLN
INTRAMUSCULAR | Status: DC | PRN
  Administered 2014-01-27: 100 ug via INTRAVENOUS

## 2014-01-27 MED ORDER — MIDAZOLAM HCL 5 MG/5ML IJ SOLN
INTRAMUSCULAR | Status: DC | PRN
  Administered 2014-01-27: 2 mg via INTRAVENOUS

## 2014-01-27 MED ORDER — METHOCARBAMOL 1000 MG/10ML IJ SOLN
500.0000 mg | Freq: Four times a day (QID) | INTRAVENOUS | Status: DC | PRN
  Administered 2014-01-27: 500 mg via INTRAVENOUS
  Filled 2014-01-27 (×2): qty 5

## 2014-01-27 MED ORDER — ALPRAZOLAM 0.5 MG PO TABS
0.5000 mg | ORAL_TABLET | Freq: Every evening | ORAL | Status: DC | PRN
  Administered 2014-01-27 – 2014-01-28 (×2): 0.5 mg via ORAL
  Filled 2014-01-27 (×2): qty 1

## 2014-01-27 MED ORDER — ACETAMINOPHEN 10 MG/ML IV SOLN
1000.0000 mg | Freq: Once | INTRAVENOUS | Status: AC
  Administered 2014-01-27: 1000 mg via INTRAVENOUS
  Filled 2014-01-27: qty 100

## 2014-01-27 MED ORDER — HYDROMORPHONE HCL 2 MG/ML IJ SOLN
INTRAMUSCULAR | Status: AC
  Filled 2014-01-27: qty 1

## 2014-01-27 MED ORDER — PANTOPRAZOLE SODIUM 40 MG PO TBEC
40.0000 mg | DELAYED_RELEASE_TABLET | Freq: Every day | ORAL | Status: DC
  Administered 2014-01-28: 40 mg via ORAL
  Filled 2014-01-27 (×2): qty 1

## 2014-01-27 MED ORDER — POLYETHYLENE GLYCOL 3350 17 G PO PACK: 17.0000 g | PACK | Freq: Every day | ORAL | Status: DC | PRN

## 2014-01-27 MED ORDER — MENTHOL 3 MG MT LOZG: 1.0000 | LOZENGE | OROMUCOSAL | Status: DC | PRN

## 2014-01-27 MED ORDER — SODIUM CHLORIDE 0.9 % IJ SOLN
INTRAMUSCULAR | Status: DC | PRN
  Administered 2014-01-27: 30 mL

## 2014-01-27 MED ORDER — ROCURONIUM BROMIDE 100 MG/10ML IV SOLN
INTRAVENOUS | Status: AC
  Filled 2014-01-27: qty 1

## 2014-01-27 MED ORDER — CEFAZOLIN SODIUM-DEXTROSE 2-3 GM-% IV SOLR
2.0000 g | Freq: Four times a day (QID) | INTRAVENOUS | Status: AC
  Administered 2014-01-27 (×2): 2 g via INTRAVENOUS
  Filled 2014-01-27 (×2): qty 50

## 2014-01-27 MED ORDER — METOCLOPRAMIDE HCL 10 MG PO TABS: 5.0000 mg | ORAL_TABLET | Freq: Three times a day (TID) | ORAL | Status: DC | PRN

## 2014-01-27 MED ORDER — BISACODYL 10 MG RE SUPP: 10.0000 mg | Freq: Every day | RECTAL | Status: DC | PRN

## 2014-01-27 MED ORDER — ROCURONIUM BROMIDE 100 MG/10ML IV SOLN
INTRAVENOUS | Status: DC | PRN
  Administered 2014-01-27: 25 mg via INTRAVENOUS
  Administered 2014-01-27: 5 mg via INTRAVENOUS

## 2014-01-27 MED ORDER — BUPIVACAINE HCL 0.25 % IJ SOLN
INTRAMUSCULAR | Status: DC | PRN
  Administered 2014-01-27: 20 mL

## 2014-01-27 MED ORDER — CEFAZOLIN SODIUM-DEXTROSE 2-3 GM-% IV SOLR
2.0000 g | INTRAVENOUS | Status: AC
  Administered 2014-01-27: 2 g via INTRAVENOUS

## 2014-01-27 MED ORDER — FENTANYL CITRATE 0.05 MG/ML IJ SOLN
25.0000 ug | INTRAMUSCULAR | Status: AC | PRN
  Administered 2014-01-27 (×6): 25 ug via INTRAVENOUS

## 2014-01-27 MED ORDER — RIVAROXABAN 10 MG PO TABS
10.0000 mg | ORAL_TABLET | Freq: Every day | ORAL | Status: DC
  Administered 2014-01-28 – 2014-01-29 (×2): 10 mg via ORAL
  Filled 2014-01-27 (×3): qty 1

## 2014-01-27 MED ORDER — BUPIVACAINE LIPOSOME 1.3 % IJ SUSP
INTRAMUSCULAR | Status: DC | PRN
  Administered 2014-01-27: 20 mL

## 2014-01-27 MED ORDER — HYDROMORPHONE HCL 1 MG/ML IJ SOLN: 0.2500 mg | INTRAMUSCULAR | Status: DC | PRN

## 2014-01-27 MED ORDER — SODIUM CHLORIDE 0.9 % IV SOLN: INTRAVENOUS | Status: DC

## 2014-01-27 MED ORDER — NEOSTIGMINE METHYLSULFATE 10 MG/10ML IV SOLN
INTRAVENOUS | Status: DC | PRN
  Administered 2014-01-27: 4 mg via INTRAVENOUS

## 2014-01-27 MED ORDER — TRANEXAMIC ACID 100 MG/ML IV SOLN
2000.0000 mg | Freq: Once | INTRAVENOUS | Status: AC
  Administered 2014-01-27: 2000 mg via TOPICAL
  Filled 2014-01-27: qty 20

## 2014-01-27 MED ORDER — GLYCOPYRROLATE 0.2 MG/ML IJ SOLN
INTRAMUSCULAR | Status: DC | PRN
  Administered 2014-01-27: 0.6 mg via INTRAVENOUS

## 2014-01-27 MED ORDER — DEXAMETHASONE SODIUM PHOSPHATE 10 MG/ML IJ SOLN
10.0000 mg | Freq: Once | INTRAMUSCULAR | Status: AC
  Administered 2014-01-28: 10 mg via INTRAVENOUS
  Filled 2014-01-27: qty 1

## 2014-01-27 MED ORDER — SERTRALINE HCL 100 MG PO TABS
100.0000 mg | ORAL_TABLET | Freq: Two times a day (BID) | ORAL | Status: DC
  Administered 2014-01-27 – 2014-01-29 (×5): 100 mg via ORAL
  Filled 2014-01-27 (×6): qty 1

## 2014-01-27 MED ORDER — ACETAMINOPHEN 325 MG PO TABS: 650.0000 mg | ORAL_TABLET | Freq: Four times a day (QID) | ORAL | Status: DC | PRN

## 2014-01-27 MED ORDER — SUCCINYLCHOLINE CHLORIDE 20 MG/ML IJ SOLN
INTRAMUSCULAR | Status: DC | PRN
  Administered 2014-01-27: 100 mg via INTRAVENOUS

## 2014-01-27 SURGICAL SUPPLY — 60 items
BAG ZIPLOCK 12X15 (MISCELLANEOUS) ×3 IMPLANT
BANDAGE ELASTIC 6 VELCRO ST LF (GAUZE/BANDAGES/DRESSINGS) ×3 IMPLANT
BANDAGE ESMARK 6X9 LF (GAUZE/BANDAGES/DRESSINGS) ×1 IMPLANT
BLADE SAG 18X100X1.27 (BLADE) ×3 IMPLANT
BLADE SAW SGTL 11.0X1.19X90.0M (BLADE) ×3 IMPLANT
BNDG ESMARK 6X9 LF (GAUZE/BANDAGES/DRESSINGS) ×3
BOWL SMART MIX CTS (DISPOSABLE) ×3 IMPLANT
CAP KNEE TOTAL 3 SIGMA ×3 IMPLANT
CEMENT HV SMART SET (Cement) ×6 IMPLANT
CLOSURE WOUND 1/2 X4 (GAUZE/BANDAGES/DRESSINGS) ×1
CUFF TOURN SGL QUICK 34 (TOURNIQUET CUFF) ×2
CUFF TRNQT CYL 34X4X40X1 (TOURNIQUET CUFF) ×1 IMPLANT
DECANTER SPIKE VIAL GLASS SM (MISCELLANEOUS) ×3 IMPLANT
DRAPE EXTREMITY T 121X128X90 (DRAPE) ×3 IMPLANT
DRAPE POUCH INSTRU U-SHP 10X18 (DRAPES) ×3 IMPLANT
DRAPE U-SHAPE 47X51 STRL (DRAPES) ×3 IMPLANT
DRSG ADAPTIC 3X8 NADH LF (GAUZE/BANDAGES/DRESSINGS) ×3 IMPLANT
DRSG PAD ABDOMINAL 8X10 ST (GAUZE/BANDAGES/DRESSINGS) IMPLANT
DURAPREP 26ML APPLICATOR (WOUND CARE) ×3 IMPLANT
ELECT REM PT RETURN 9FT ADLT (ELECTROSURGICAL) ×3
ELECTRODE REM PT RTRN 9FT ADLT (ELECTROSURGICAL) ×1 IMPLANT
EVACUATOR 1/8 PVC DRAIN (DRAIN) ×3 IMPLANT
FACESHIELD WRAPAROUND (MASK) ×15 IMPLANT
GAUZE SPONGE 4X4 12PLY STRL (GAUZE/BANDAGES/DRESSINGS) ×3 IMPLANT
GLOVE BIO SURGEON STRL SZ7.5 (GLOVE) IMPLANT
GLOVE BIO SURGEON STRL SZ8 (GLOVE) IMPLANT
GLOVE BIOGEL PI IND STRL 6.5 (GLOVE) ×4 IMPLANT
GLOVE BIOGEL PI IND STRL 8 (GLOVE) ×4 IMPLANT
GLOVE BIOGEL PI INDICATOR 6.5 (GLOVE) ×8
GLOVE BIOGEL PI INDICATOR 8 (GLOVE) ×8
GLOVE SURG SS PI 6.5 STRL IVOR (GLOVE) IMPLANT
GOWN STRL REUS W/TWL LRG LVL3 (GOWN DISPOSABLE) ×3 IMPLANT
GOWN STRL REUS W/TWL XL LVL3 (GOWN DISPOSABLE) IMPLANT
HANDPIECE INTERPULSE COAX TIP (DISPOSABLE) ×2
IMMOBILIZER KNEE 20 (SOFTGOODS) ×3 IMPLANT
IMMOBILIZER KNEE 20 THIGH 36 (SOFTGOODS) IMPLANT
KIT BASIN OR (CUSTOM PROCEDURE TRAY) ×3 IMPLANT
MANIFOLD NEPTUNE II (INSTRUMENTS) ×3 IMPLANT
NDL SAFETY ECLIPSE 18X1.5 (NEEDLE) ×2 IMPLANT
NEEDLE HYPO 18GX1.5 SHARP (NEEDLE) ×4
NS IRRIG 1000ML POUR BTL (IV SOLUTION) ×3 IMPLANT
PACK TOTAL JOINT (CUSTOM PROCEDURE TRAY) ×3 IMPLANT
PAD ABD 8X10 STRL (GAUZE/BANDAGES/DRESSINGS) ×3 IMPLANT
PADDING CAST COTTON 6X4 STRL (CAST SUPPLIES) ×3 IMPLANT
POSITIONER SURGICAL ARM (MISCELLANEOUS) ×3 IMPLANT
SET HNDPC FAN SPRY TIP SCT (DISPOSABLE) ×1 IMPLANT
STRIP CLOSURE SKIN 1/2X4 (GAUZE/BANDAGES/DRESSINGS) ×2 IMPLANT
SUCTION FRAZIER 12FR DISP (SUCTIONS) ×3 IMPLANT
SUT MNCRL AB 4-0 PS2 18 (SUTURE) ×3 IMPLANT
SUT VIC AB 2-0 CT1 27 (SUTURE) ×8
SUT VIC AB 2-0 CT1 TAPERPNT 27 (SUTURE) ×4 IMPLANT
SUT VLOC 180 0 24IN GS25 (SUTURE) ×3 IMPLANT
SYR 20CC LL (SYRINGE) ×3 IMPLANT
SYR 50ML LL SCALE MARK (SYRINGE) ×3 IMPLANT
TOWEL OR 17X26 10 PK STRL BLUE (TOWEL DISPOSABLE) ×3 IMPLANT
TOWEL OR NON WOVEN STRL DISP B (DISPOSABLE) IMPLANT
TRAY FOLEY CATH 14FRSI W/METER (CATHETERS) IMPLANT
TRAY FOLEY METER SIL LF 16FR (CATHETERS) ×3 IMPLANT
WATER STERILE IRR 1500ML POUR (IV SOLUTION) ×3 IMPLANT
WRAP KNEE MAXI GEL POST OP (GAUZE/BANDAGES/DRESSINGS) ×3 IMPLANT

## 2014-01-27 NOTE — Anesthesia Postprocedure Evaluation (Signed)
  Anesthesia Post-op Note  Patient: [name] [name]  Procedure(s) Performed: Procedure(s) (LRB): LEFT TOTAL KNEE ARTHROPLASTY (Left)  Patient Location: PACU  Anesthesia Type: Spinal  Level of Consciousness: awake and alert   Airway and Oxygen Therapy: Patient Spontanous Breathing  Post-op Pain: mild  Post-op Assessment: Post-op Vital signs reviewed, Patient's Cardiovascular Status Stable, Respiratory Function Stable, Patent Airway and No signs of Nausea or vomiting  Last Vitals:  Filed Vitals:   01/27/14 0930  BP: 135/47  Pulse: 87  Temp:   Resp: 16    Post-op Vital Signs: stable   Complications: No apparent anesthesia complications

## 2014-01-27 NOTE — Evaluation (Signed)
Physical Therapy Evaluation Patient Details Name: [name] [name] MRN: [id number] DOB: 05-Feb-1934 Today's Date: 01/27/2014   History of Present Illness  80 yo female s/p L TKA  Clinical Impression  Pt admitted with above diagnosis. Pt currently with functional limitations due to the deficits listed below (see PT Problem List). * Pt will benefit from skilled PT to increase their independence and safety with mobility to allow discharge to the venue listed below.       Follow Up Recommendations SNF    Equipment Recommendations  None recommended by PT    Recommendations for Other Services       Precautions / Restrictions Precautions Precautions: Knee Required Braces or Orthoses: Knee Immobilizer - Left Knee Immobilizer - Left: Discontinue once straight leg raise with < 10 degree lag      Mobility  Bed Mobility Overal bed mobility: Needs Assistance Bed Mobility: Supine to Sit     Supine to sit: Min assist     General bed mobility comments: with LLE, cues for technique, self assist  Transfers Overall transfer level: Needs assistance Equipment used: Rolling walker (2 wheeled) Transfers: Sit to/from Stand Sit to Stand: Min assist         General transfer comment: to rise, stabilize; cues for hand placement  Ambulation/Gait Ambulation/Gait assistance: Min assist Ambulation Distance (Feet): 20 Feet Assistive device: Rolling walker (2 wheeled) Gait Pattern/deviations: Step-to pattern;Decreased stance time - left     General Gait Details: cues for sequence, step length  Stairs            Wheelchair Mobility    Modified Rankin (Stroke Patients Only)       Balance                                             Pertinent Vitals/Pain Pain Assessment: 0-10 Pain Score: 5  Pain Location: left knee Pain Descriptors / Indicators: Constant Pain Intervention(s): Limited activity within patient's tolerance;Monitored during  session;Premedicated before session    Home Living Family/patient expects to be discharged to:: Skilled nursing facility Living Arrangements: Alone                    Prior Function Level of Independence: Independent with assistive device(s)         Comments: amb with walker or cane     Hand Dominance        Extremity/Trunk Assessment   Upper Extremity Assessment: Overall WFL for tasks assessed           Lower Extremity Assessment: LLE deficits/detail   LLE Deficits / Details: hip flexion and knee extension grossly 3/5, knee flexion limited by pain, grossly ~35*     Communication   Communication: No difficulties  Cognition Arousal/Alertness: Awake/alert Behavior During Therapy: WFL for tasks assessed/performed Overall Cognitive Status: Within Functional Limits for tasks assessed                      General Comments      Exercises Total Joint Exercises Ankle Circles/Pumps: AROM;Both;10 reps Quad Sets: AROM;Both;10 reps      Assessment/Plan    PT Assessment Patient needs continued PT services  PT Diagnosis Difficulty walking   PT Problem List Decreased strength;Decreased range of motion;Decreased activity tolerance;Decreased balance;Decreased mobility;Decreased knowledge of use of DME  PT Treatment Interventions DME instruction;Gait training;Functional mobility training;Therapeutic activities;Therapeutic  exercise;Patient/family education   PT Goals (Current goals can be found in the Care Plan section) Acute Rehab PT Goals Patient Stated Goal: to return to I lifestyle PT Goal Formulation: With patient Time For Goal Achievement: 01/31/14 Potential to Achieve Goals: Good    Frequency 7X/week   Barriers to discharge        Co-evaluation               End of Session Equipment Utilized During Treatment: Gait belt;Left knee immobilizer Activity Tolerance: Patient tolerated treatment well Patient left: in chair;with call  bell/phone within reach Nurse Communication: Mobility status         Time: 1425-1451 PT Time Calculation (min) (ACUTE ONLY): 26 min   Charges:   PT Evaluation $Initial PT Evaluation Tier I: 1 Procedure PT Treatments $Gait Training: 8-22 mins   PT G Codes:        Jessey Stehlin 02-18-14, 2:55 PM

## 2014-01-27 NOTE — Plan of Care (Signed)
Problem: Consults Goal: Diagnosis- Total Joint Replacement Outcome: Completed/Met Date Met:  01/27/14 Primary Total Knee

## 2014-01-27 NOTE — Op Note (Addendum)
Pre-operative diagnosis- Osteoarthritis  Left knee(s)  Post-operative diagnosis- Osteoarthritis Left knee(s)  Procedure-  Left  Total Knee Arthroplasty  Surgeon- Dione Plover. Arynn Armand, MD  Assistant- Ardeen Jourdain, PA-C   Anesthesia-  General  EBL-* No blood loss amount entered *   Drains Hemovac  Tourniquet time- 35 minutes @ 017 mm Hg  Complications- None  Condition-PACU - hemodynamically stable.   Brief Clinical Note  [name] [name] is a 80 y.o. year old female with end stage OA of her left knee with progressively worsening pain and dysfunction. She has constant pain, with activity and at rest and significant functional deficits with difficulties even with ADLs. She has had extensive non-op management including analgesics, injections of cortisone and viscosupplements, and home exercise program, but remains in significant pain with significant dysfunction. Radiographs show bone on bone arthritis lateral and patellofemoral. She presents now for left Total Knee Arthroplasty.    Procedure in detail---   The patient is brought into the operating room and positioned supine on the operating table. After successful administration of  General,   a tourniquet is placed high on the  Left thigh(s) and the lower extremity is prepped and draped in the usual sterile fashion. Time out is performed by the operating team and then the  Left lower extremity is wrapped in Esmarch, knee flexed and the tourniquet inflated to 300 mmHg.       A midline incision is made with a ten blade through the subcutaneous tissue to the level of the extensor mechanism. A fresh blade is used to make a medial parapatellar arthrotomy. Soft tissue over the proximal medial tibia is subperiosteally elevated to the joint line with a knife and into the semimembranosus bursa with a Cobb elevator. Soft tissue over the proximal lateral tibia is elevated with attention being paid to avoiding the patellar tendon on the tibial tubercle.  The patella is everted, knee flexed 90 degrees and the ACL and PCL are removed. Findings are bone on bone lateral and patellofemoral with large lateral osteophytes.        The drill is used to create a starting hole in the distal femur and the canal is thoroughly irrigated with sterile saline to remove the fatty contents. The 5 degree Left  valgus alignment guide is placed into the femoral canal and the distal femoral cutting block is pinned to remove 10 mm off the distal femur. Resection is made with an oscillating saw.      The tibia is subluxed forward and the menisci are removed. The extramedullary alignment guide is placed referencing proximally at the medial aspect of the tibial tubercle and distally along the second metatarsal axis and tibial crest. The block is pinned to remove 59mm off the more deficient lateral  side. Resection is made with an oscillating saw. Size 3is the most appropriate size for the tibia and the proximal tibia is prepared with the modular drill and keel punch for that size.      The femoral sizing guide is placed and size 4 is most appropriate. Rotation is marked off the epicondylar axis and confirmed by creating a rectangular flexion gap at 90 degrees. The size 4 cutting block is pinned in this rotation and the anterior, posterior and chamfer cuts are made with the oscillating saw. The intercondylar block is then placed and that cut is made.      Trial size 3 tibial component, trial size 4 posterior stabilized femur and a 12.5  mm posterior stabilized  rotating platform insert trial is placed. Full extension is achieved with excellent varus/valgus and anterior/posterior balance throughout full range of motion. The patella is everted and thickness measured to be 25  mm. Free hand resection is taken to 15 mm, a 38 template is placed, lug holes are drilled, trial patella is placed, and it tracks normally. Osteophytes are removed off the posterior femur with the trial in place. All  trials are removed and the cut bone surfaces prepared with pulsatile lavage. Cement is mixed and once ready for implantation, the size 3 tibial implant, size  4 narrow posterior stabilized femoral component, and the size 38 patella are cemented in place and the patella is held with the clamp. The trial insert is placed and the knee held in full extension. The Exparel (20 ml mixed with 30 ml saline) and .25% Bupivicaine, are injected into the extensor mechanism, posterior capsule, medial and lateral gutters and subcutaneous tissues.  All extruded cement is removed and once the cement is hard the permanent 12.5 mm posterior stabilized rotating platform insert is placed into the tibial tray.      The wound is copiously irrigated with saline solution and the extensor mechanism closed over a hemovac drain with #1 V-loc suture. The tourniquet is released for a total tourniquet time of 35  minutes. Flexion against gravity is 140 degrees and the patella tracks normally. Subcutaneous tissue is closed with 2.0 vicryl and subcuticular with running 4.0 Monocryl. The incision is cleaned and dried and steri-strips and a bulky sterile dressing are applied. The limb is placed into a knee immobilizer and the patient is awakened and transported to recovery in stable condition.      Please note that a surgical assistant was a medical necessity for this procedure in order to perform it in a safe and expeditious manner. Surgical assistant was necessary to retract the ligaments and vital neurovascular structures to prevent injury to them and also necessary for proper positioning of the limb to allow for anatomic placement of the prosthesis.   Dione Plover Karlon Schlafer, MD    01/27/2014, 8:11 AM

## 2014-01-27 NOTE — Transfer of Care (Signed)
Immediate Anesthesia Transfer of Care Note  Patient: [name] [name]  Procedure(s) Performed: Procedure(s): LEFT TOTAL KNEE ARTHROPLASTY (Left)  Patient Location: PACU  Anesthesia Type:General  Level of Consciousness: awake, alert  and oriented  Airway & Oxygen Therapy: Patient Spontanous Breathing and Patient connected to face mask oxygen  Post-op Assessment: Report given to PACU RN and Post -op Vital signs reviewed and stable  Post vital signs: Reviewed and stable  Complications: No apparent anesthesia complications

## 2014-01-27 NOTE — Anesthesia Preprocedure Evaluation (Addendum)
Anesthesia Evaluation  Patient identified by MRN, date of birth, ID band Patient awake    Reviewed: Allergy & Precautions, H&P , NPO status , Patient's Chart, lab work & pertinent test results  History of Anesthesia Complications (+) PONV  Airway Mallampati: II  TM Distance: >3 FB Neck ROM: full    Dental no notable dental hx. (+) Edentulous Upper, Edentulous Lower, Dental Advisory Given   Pulmonary neg pulmonary ROS, shortness of breath and with exertion,  breath sounds clear to auscultation  Pulmonary exam normal       Cardiovascular Exercise Tolerance: Good negative cardio ROS  Rhythm:regular Rate:Normal  palpitations   Neuro/Psych Anxiety Depression TIACVA negative psych ROS   GI/Hepatic negative GI ROS, Neg liver ROS, GERD-  Medicated and Controlled,  Endo/Other  negative endocrine ROS  Renal/GU negative Renal ROS  negative genitourinary   Musculoskeletal   Abdominal   Peds  Hematology negative hematology ROS (+)   Anesthesia Other Findings   Reproductive/Obstetrics negative OB ROS                             Anesthesia Physical Anesthesia Plan  ASA: III  Anesthesia Plan: General   Post-op Pain Management:    Induction: Intravenous  Airway Management Planned: Oral ETT  Additional Equipment:   Intra-op Plan:   Post-operative Plan: Extubation in OR  Informed Consent: I have reviewed the patients History and Physical, chart, labs and discussed the procedure including the risks, benefits and alternatives for the proposed anesthesia with the patient or authorized representative who has indicated his/her understanding and acceptance.   Dental Advisory Given  Plan Discussed with: Surgeon  Anesthesia Plan Comments:         Anesthesia Quick Evaluation

## 2014-01-27 NOTE — Progress Notes (Signed)
Utilization review completed.  

## 2014-01-27 NOTE — Interval H&P Note (Signed)
History and Physical Interval Note:  01/27/2014 6:50 AM  [name] [name]  has presented today for surgery, with the diagnosis of left knee osteoarthritis  The various methods of treatment have been discussed with the patient and family. After consideration of risks, benefits and other options for treatment, the patient has consented to  Procedure(s): LEFT TOTAL KNEE ARTHROPLASTY (Left) as a surgical intervention .  The patient's history has been reviewed, patient examined, no change in status, stable for surgery.  I have reviewed the patient's chart and labs.  Questions were answered to the patient's satisfaction.     Gearlean Alf

## 2014-01-28 LAB — BASIC METABOLIC PANEL
Anion gap: 6 (ref 5–15)
BUN: 11 mg/dL (ref 6–23)
CALCIUM: 8.5 mg/dL (ref 8.4–10.5)
CO2: 29 mmol/L (ref 19–32)
CREATININE: 0.78 mg/dL (ref 0.50–1.10)
Chloride: 105 mEq/L (ref 96–112)
GFR calc non Af Amer: 79 mL/min — ABNORMAL LOW (ref >90)
GLUCOSE: 123 mg/dL — AB (ref 70–99)
Potassium: 4.1 mmol/L (ref 3.5–5.1)
Sodium: 140 mmol/L (ref 135–145)

## 2014-01-28 LAB — CBC
HCT: 32.6 % — ABNORMAL LOW (ref 36.0–46.0)
Hemoglobin: 10.5 g/dL — ABNORMAL LOW (ref 12.0–15.0)
MCH: 29.4 pg (ref 26.0–34.0)
MCHC: 32.2 g/dL (ref 30.0–36.0)
MCV: 91.3 fL (ref 78.0–100.0)
PLATELETS: 223 10*3/uL (ref 150–400)
RBC: 3.57 MIL/uL — AB (ref 3.87–5.11)
RDW: 13.9 % (ref 11.5–15.5)
WBC: 8.7 10*3/uL (ref 4.0–10.5)

## 2014-01-28 MED ORDER — ESOMEPRAZOLE MAGNESIUM 20 MG PO CPDR
40.0000 mg | DELAYED_RELEASE_CAPSULE | Freq: Two times a day (BID) | ORAL | Status: DC
  Administered 2014-01-28 – 2014-01-29 (×2): 40 mg via ORAL
  Filled 2014-01-28 (×5): qty 2

## 2014-01-28 NOTE — Progress Notes (Signed)
Physical Therapy Treatment Patient Details Name: [name] [name] MRN: [id number] DOB: 1934/04/20 Today's Date: 01/28/2014    History of Present Illness 80 yo female s/p L TKA    PT Comments    Pt progressing slowly, slightly dizzy this am during amb, reported feeling "better" once seated  Follow Up Recommendations  SNF     Equipment Recommendations  None recommended by PT    Recommendations for Other Services       Precautions / Restrictions Precautions Precautions: Knee Precaution Comments: I SLR, KI not utilized Restrictions Weight Bearing Restrictions: No Other Position/Activity Restrictions: WBAT    Mobility  Bed Mobility Overal bed mobility: Needs Assistance Bed Mobility: Supine to Sit     Supine to sit: Min assist     General bed mobility comments: with LLE, cues for technique, self assist  Transfers Overall transfer level: Needs assistance Equipment used: Rolling walker (2 wheeled) Transfers: Sit to/from Stand Sit to Stand: Min assist         General transfer comment: to rise, stabilize; cues for hand placement  Ambulation/Gait Ambulation/Gait assistance: Min assist Ambulation Distance (Feet): 45 Feet Assistive device: Rolling walker (2 wheeled) Gait Pattern/deviations: Step-to pattern;Trunk flexed;Antalgic     General Gait Details: cues for sequence, step length   Stairs            Wheelchair Mobility    Modified Rankin (Stroke Patients Only)       Balance                                    Cognition Arousal/Alertness: Awake/alert Behavior During Therapy: WFL for tasks assessed/performed Overall Cognitive Status: Within Functional Limits for tasks assessed                      Exercises Total Joint Exercises Ankle Circles/Pumps: AROM;Both;10 reps Quad Sets: AROM;Both;10 reps Heel Slides: AAROM;AROM;Left;10 reps Hip ABduction/ADduction: AROM;Strengthening;Left;10 reps Straight Leg Raises:  AROM;Strengthening;Left;15 reps Goniometric ROM: -10 to ~60    General Comments        Pertinent Vitals/Pain Pain Assessment: 0-10 Pain Score: 5  Pain Location: L knee Pain Descriptors / Indicators: Sore Pain Intervention(s): Limited activity within patient's tolerance;Monitored during session;Premedicated before session    Home Living Family/patient expects to be discharged to:: Skilled nursing facility                    Prior Function Level of Independence: Independent with assistive device(s)          PT Goals (current goals can now be found in the care plan section) Acute Rehab PT Goals Patient Stated Goal: to return to I lifestyle PT Goal Formulation: With patient Potential to Achieve Goals: Good Progress towards PT goals: Progressing toward goals    Frequency  7X/week    PT Plan Current plan remains appropriate    Co-evaluation PT/OT/SLP Co-Evaluation/Treatment: Yes (partial co-tx d/t pt slighty dizzy;) Reason for Co-Treatment: For patient/therapist safety PT goals addressed during session: Proper use of DME;Mobility/safety with mobility       End of Session Equipment Utilized During Treatment: Gait belt Activity Tolerance: Patient limited by fatigue Patient left: in chair;with call bell/phone within reach     Time: 0630-1601 PT Time Calculation (min) (ACUTE ONLY): 30 min  Charges:  $Gait Training: 8-22 mins $Therapeutic Exercise: 8-22 mins  G CodesKenyon [name] 01/28/2014, 10:33 AM

## 2014-01-28 NOTE — Progress Notes (Signed)
   01/28/14 1300  PT Visit Information  Last PT Received On 01/28/14  Assistance Needed +1  History of Present Illness 80 yo female s/p L TKA  PT Time Calculation  PT Start Time (ACUTE ONLY) 1335  PT Stop Time (ACUTE ONLY) 1354  PT Time Calculation (min) (ACUTE ONLY) 19 min  Subjective Data  Patient Stated Goal to return to I lifestyle  Precautions  Precautions Knee  Precaution Comments I SLR, KI not utilized  Required Braces or Orthoses Knee Immobilizer - Left  Knee Immobilizer - Left Discontinue once straight leg raise with < 10 degree lag  Restrictions  Other Position/Activity Restrictions WBAT  Pain Assessment  Pain Assessment 0-10  Pain Score 2  Pain Location L knee  Pain Descriptors / Indicators Constant  Pain Intervention(s) Limited activity within patient's tolerance;Monitored during session;Repositioned  Cognition  Arousal/Alertness Awake/alert  Behavior During Therapy WFL for tasks assessed/performed  Overall Cognitive Status Within Functional Limits for tasks assessed  Bed Mobility  Overal bed mobility Needs Assistance  Bed Mobility Sit to Supine  Sit to supine Min assist  General bed mobility comments with LLE, cues for technique, self assist  Transfers  Overall transfer level Needs assistance  Equipment used Rolling walker (2 wheeled)  Transfers Sit to/from Stand  Sit to Stand Min guard;Min assist  General transfer comment to rise, stabilize; cues for hand placement  Ambulation/Gait  Ambulation/Gait assistance Min guard;Min assist  Ambulation Distance (Feet) 70 Feet  Assistive device Rolling walker (2 wheeled)  Gait Pattern/deviations Step-to pattern;Antalgic;Trunk flexed  General Gait Details cues for sequence, step length  PT - End of Session  Equipment Utilized During Treatment Gait belt  Activity Tolerance Patient tolerated treatment well  Patient left in bed;with call bell/phone within reach  Nurse Communication Mobility status  PT - Assessment/Plan   PT Plan Current plan remains appropriate  PT Frequency (ACUTE ONLY) 7X/week  Follow Up Recommendations SNF  PT equipment None recommended by PT  PT Goal Progression  Progress towards PT goals Progressing toward goals  Acute Rehab PT Goals  PT Goal Formulation With patient  Time For Goal Achievement 01/31/14  Potential to Achieve Goals Good  PT General Charges  $$ ACUTE PT VISIT 1 Procedure  PT Treatments  $Gait Training 8-22 mins

## 2014-01-28 NOTE — Progress Notes (Signed)
Clinical Social Work Department BRIEF PSYCHOSOCIAL ASSESSMENT 01/28/2014  Patient:  [name] [name], [name] [name]     Account Number:  [id number]     Admit date:  01/27/2014  Clinical Social Worker:  Lacie Scotts  Date/Time:  01/28/2014 02:09 PM  Referred by:  Physician  Date Referred:  01/28/2014 Referred for  SNF Placement   Other Referral:   Interview type:  Patient Other interview type:    PSYCHOSOCIAL DATA Living Status:  ALONE Admitted from facility:   Level of care:   Primary support name:  Delice Lesch Primary support relationship to patient:  CHILD, ADULT Degree of support available:   unclear    CURRENT CONCERNS Current Concerns  Post-Acute Placement   Other Concerns:    SOCIAL WORK ASSESSMENT / PLAN Pt is a 80 yr old female living at home prior to hospitalization. This is a planned admission. CSW met with pt to assist with d/c planning. Pt has made arrangements to have ST Rehab at [facility] following hospital d/c. CSW has confirmed d/c plan with SNF. CSW will continue to follow to assist with d/c planning needs.   Assessment/plan status:  Psychosocial Support/Ongoing Assessment of Needs Other assessment/ plan:   Information/referral to community resources:   Insurance coverage for SNF and ambulance transport reviewed.    PATIENT'S/FAMILY'S RESPONSE TO PLAN OF CARE: Pt is feeling poorly this am. She has had little sleep following surgery. Pt has been to [facility] in the past and is looking forward to returning for rehab.    Werner Lean LCSW [phone]

## 2014-01-28 NOTE — Progress Notes (Addendum)
Subjective: 1 Day Post-Op Procedure(s) (LRB): LEFT TOTAL KNEE ARTHROPLASTY (Left) Patient reports pain as mild and moderate.   Patient seen in rounds with Dr. Wynelle Link. Walked 20 feet yesterday following surgery. Hurting more this morning as compared to yesterday. Patient is well, but has had some minor complaints of pain in the knee, requiring pain medications We will resume therapy today.  Plan is to go Skilled nursing facility after hospital stay.  Plan for [facility].  Objective: Vital signs in last 24 hours: Temp:  [97.6 F (36.4 C)-98.1 F (36.7 C)] 98 F (36.7 C) (01/12 0625) Pulse Rate:  [66-101] 74 (01/12 0625) Resp:  [12-18] 16 (01/12 0625) BP: (107-145)/(46-105) 114/46 mmHg (01/12 0625) SpO2:  [91 %-100 %] 97 % (01/12 0625)  Intake/Output from previous day:  Intake/Output Summary (Last 24 hours) at 01/28/14 0851 Last data filed at 01/28/14 0626  Gross per 24 hour  Intake   1360 ml  Output   2060 ml  Net   -700 ml    Labs:  Recent Labs  01/28/14 0420  HGB 10.5*    Recent Labs  01/28/14 0420  WBC 8.7  RBC 3.57*  HCT 32.6*  PLT 223    Recent Labs  01/28/14 0420  NA 140  K 4.1  CL 105  CO2 29  BUN 11  CREATININE 0.78  GLUCOSE 123*  CALCIUM 8.5   No results for input(s): LABPT, INR in the last 72 hours.  EXAM General - Patient is Alert, Appropriate and Oriented Extremity - Neurovascular intact Sensation intact distally Dorsiflexion/Plantar flexion intact Dressing - dressing C/D/I Motor Function - intact, moving foot and toes well on exam.  Hemovac pulled without difficulty.  Past Medical History  Diagnosis Date  . Painful respiration   . Personal history of unspecified circulatory disease   . Palpitations   . Other and unspecified hyperlipidemia   . Esophageal reflux   . Depressive disorder, not elsewhere classified   . Anxiety state, unspecified   . Unspecified arthropathy, multiple sites   . Diaphragmatic hernia without  mention of obstruction or gangrene   . External hemorrhoids without mention of complication   . Diverticulosis of colon (without mention of hemorrhage)   . Diarrhea   . Special screening for malignant neoplasms of other sites   . Unspecified vitamin D deficiency   . Other malaise and fatigue 08-11-11    hx. "chronic fatigue syndrome"  . Disorder of bone and cartilage, unspecified   . Helicobacter pylori (H. pylori)   . Macular degeneration (senile) of retina, unspecified   . Shortness of breath 08-11-11    with exertion only at present  . Unspecified cerebral artery occlusion with cerebral infarction 08-11-11    '97/ '06( TIA)-affected lt. side, no residual  . Gallstones 08-11-11    asymptomatic at present  . Personal history of other diseases of digestive system 08-11-11    "Irittable bowel syndrome"  . Urinary incontinence 08-11-11    wears peripad daily  . Osteoarthrosis, unspecified whether generalized or localized, unspecified site 08-11-11    hx. "rhematoid arthritis", osteoarthritis, DDD, bursitis(hip)  . Lumbar spondylosis   . Complication of anesthesia     slow to wake up with surgery several years ago   . PONV (postoperative nausea and vomiting)     Assessment/Plan: 1 Day Post-Op Procedure(s) (LRB): LEFT TOTAL KNEE ARTHROPLASTY (Left) Principal Problem:   OA (osteoarthritis) of knee  Estimated body mass index is 32.62 kg/(m^2) as calculated from the  following:   Height as of this encounter: 5\' 6"  (1.676 m).   Weight as of this encounter: 91.627 kg (202 lb). Advance diet Up with therapy Plan for discharge tomorrow Discharge to SNF - Plan for [facility]  DVT Prophylaxis - Xarelto for three weeks and then resume the home Plavix dosing. Weight-Bearing as tolerated to left leg D/C O2 and Pulse OX and try on Room Air  Arlee Muslim, PA-C Orthopaedic Surgery 01/28/2014, 8:51 AM

## 2014-01-28 NOTE — Evaluation (Signed)
Occupational Therapy Evaluation Patient Details Name: [name] [name] MRN: [id number] DOB: 11/03/1934 Today's Date: 01/28/2014    History of Present Illness 80 yo female s/p L TKA   Clinical Impression   Pt was admitted for the above surgery.  She will benefit from skilled OT in acute setting as well as SNF to restore her  mod I level. with adls.  Goals in acute are for supervision level.     Follow Up Recommendations  SNF    Equipment Recommendations  3 in 1 bedside comode    Recommendations for Other Services       Precautions / Restrictions Precautions Precautions: Knee Restrictions Weight Bearing Restrictions: No      Mobility Bed Mobility                  Transfers   Equipment used: Rolling walker (2 wheeled) Transfers: Sit to/from Stand Sit to Stand: Min assist         General transfer comment: to rise, stabilize; cues for hand placement    Balance                                            ADL Overall ADL's : Needs assistance/impaired     Grooming: Wash/dry hands;Supervision/safety;Standing           Upper Body Dressing : Set up;Sitting       Toilet Transfer: Minimal assistance;BSC;Ambulation   Toileting- Clothing Manipulation and Hygiene: Minimal assistance;Sit to/from stand         General ADL Comments: Pt did not want to get washed up this am.  She is able to reach to L foot and will only need min A for LB ADLs.   Safety cues for side stepping to commode and to step all the way up to the sink when attempting to wash hands     Vision                     Perception     Praxis      Pertinent Vitals/Pain Pain Assessment: 0-10 Pain Score: 5  Pain Location: L knee Pain Descriptors / Indicators: Sore Pain Intervention(s): Limited activity within patient's tolerance;Monitored during session (left with PT)     Hand Dominance     Extremity/Trunk Assessment Upper Extremity Assessment Upper  Extremity Assessment: Overall WFL for tasks assessed           Communication Communication Communication: No difficulties   Cognition Arousal/Alertness: Awake/alert Behavior During Therapy: WFL for tasks assessed/performed Overall Cognitive Status: Within Functional Limits for tasks assessed                     General Comments       Exercises       Shoulder Instructions      Home Living Family/patient expects to be discharged to:: Skilled nursing facility                                        Prior Functioning/Environment Level of Independence: Independent with assistive device(s)             OT Diagnosis: Generalized weakness;Acute pain   OT Problem List: Decreased strength;Decreased activity tolerance;Decreased knowledge of use of DME or AE;Pain  OT Treatment/Interventions: Self-care/ADL training;DME and/or AE instruction;Patient/family education    OT Goals(Current goals can be found in the care plan section) Acute Rehab OT Goals Patient Stated Goal: to return to I lifestyle OT Goal Formulation: With patient Time For Goal Achievement: 02/04/14 Potential to Achieve Goals: Good ADL Goals Pt Will Perform Lower Body Bathing: with supervision;with adaptive equipment;sit to/from stand Pt Will Perform Lower Body Dressing: with supervision;with adaptive equipment;sit to/from stand Pt Will Transfer to Toilet: with supervision;bedside commode;ambulating Pt Will Perform Toileting - Clothing Manipulation and hygiene: with supervision;sit to/from stand  OT Frequency: Min 2X/week   Barriers to D/C:            Co-evaluation PT/OT/SLP Co-Evaluation/Treatment:  (overlapped with PT:  pt needed to use bathroom)            End of Session CPM Left Knee CPM Left Knee: Off CPM Right Knee CPM Right Knee: Off  Activity Tolerance: Patient tolerated treatment well Patient left:  (with PT)   Time: 3810-1751 OT Time Calculation (min): 12  min Charges:  OT General Charges $OT Visit: 1 Procedure OT Evaluation $Initial OT Evaluation Tier I: 1 Procedure G-Codes:    Monterrio Gerst 2014-02-18, 10:29 AM  Lesle Chris, OTR/L [phone] 02/18/2014

## 2014-01-28 NOTE — Discharge Instructions (Addendum)
° °Dr. Frank Aluisio °Total Joint Specialist °[facility] °[address] °[place], [place] [address] °[phone] ° °TOTAL KNEE REPLACEMENT POSTOPERATIVE DIRECTIONS ° ° ° °Knee Rehabilitation, Guidelines Following Surgery  °Results after knee surgery are often greatly improved when you follow the exercise, range of motion and muscle strengthening exercises prescribed by your doctor. Safety measures are also important to protect the knee from further injury. Any time any of these exercises cause you to have increased pain or swelling in your knee joint, decrease the amount until you are comfortable again and slowly increase them. If you have problems or questions, call your caregiver or physical therapist for advice.  ° °HOME CARE INSTRUCTIONS  °Remove items at home which could result in a fall. This includes throw rugs or furniture in walking pathways.  °Continue medications as instructed at time of discharge. °You may have some home medications which will be placed on hold until you complete the course of blood thinner medication.  °You may start showering once you are discharged home but do not submerge the incision under water. Just pat the incision dry and apply a dry gauze dressing on daily. °Walk with walker as instructed.  °You may resume a sexual relationship in one month or when given the OK by  your doctor.  °· Use walker as long as suggested by your caregivers. °· Avoid periods of inactivity such as sitting longer than an hour when not asleep. This helps prevent blood clots.  °You may put full weight on your legs and walk as much as is comfortable.  °You may return to work once you are cleared by your doctor.  °Do not drive a car for 6 weeks or until released by you surgeon.  °· Do not drive while taking narcotics.  °Wear the elastic stockings for three weeks following surgery during the day but you may remove then at night. °Make sure you keep all of your appointments after your  operation with all of your doctors and caregivers. You should call the office at the above phone number and make an appointment for approximately two weeks after the date of your surgery. °Change the dressing daily and reapply a dry dressing each time. °Please pick up a stool softener and laxative for home use as long as you are requiring pain medications. °· ICE to the affected knee every three hours for 30 minutes at a time and then as needed for pain and swelling.  Continue to use ice on the knee for pain and swelling from surgery. You may notice swelling that will progress down to the foot and ankle.  This is normal after surgery.  Elevate the leg when you are not up walking on it.   °It is important for you to complete the blood thinner medication as prescribed by your doctor. °· Continue to use the breathing machine which will help keep your temperature down.  It is common for your temperature to cycle up and down following surgery, especially at night when you are not up moving around and exerting yourself.  The breathing machine keeps your lungs expanded and your temperature down. ° °RANGE OF MOTION AND STRENGTHENING EXERCISES  °Rehabilitation of the knee is important following a knee injury or an operation. After just a few days of immobilization, the muscles of the thigh which control the knee become weakened and shrink (atrophy). Knee exercises are designed to build up the tone and strength of the thigh muscles and to improve knee   motion. Often times heat used for twenty to thirty minutes before working out will loosen up your tissues and help with improving the range of motion but do not use heat for the first two weeks following surgery. These exercises can be done on a training (exercise) mat, on the floor, on a table or on a bed. Use what ever works the best and is most comfortable for you Knee exercises include:  Leg Lifts - While your knee is still immobilized in a splint or cast, you can do  straight leg raises. Lift the leg to 60 degrees, hold for 3 sec, and slowly lower the leg. Repeat 10-20 times 2-3 times daily. Perform this exercise against resistance later as your knee gets better.  Quad and Hamstring Sets - Tighten up the muscle on the front of the thigh (Quad) and hold for 5-10 sec. Repeat this 10-20 times hourly. Hamstring sets are done by pushing the foot backward against an object and holding for 5-10 sec. Repeat as with quad sets.  A rehabilitation program following serious knee injuries can speed recovery and prevent re-injury in the future due to weakened muscles. Contact your doctor or a physical therapist for more information on knee rehabilitation.   SKILLED REHAB INSTRUCTIONS: If the patient is transferred to a skilled rehab facility following release from the hospital, a list of the current medications will be sent to the facility for the patient to continue.  When discharged from the skilled rehab facility, please have the facility set up the patient's [place] prior to being released. Also, the skilled facility will be responsible for providing the patient with their medications at time of release from the facility to include their pain medication, the muscle relaxants, and their blood thinner medication. If the patient is still at the rehab facility at time of the two week follow up appointment, the skilled rehab facility will also need to assist the patient in arranging follow up appointment in our office and any transportation needs.  MAKE SURE YOU:  Understand these instructions.  Will watch your condition.  Will get help right away if you are not doing well or get worse.    Pick up stool softner and laxative for home use following surgery while on pain medications. Do not submerge incision under water. Please use good hand washing techniques while changing dressing each day. May shower starting three days after surgery. Please use a clean  towel to pat the incision dry following showers. Continue to use ice for pain and swelling after surgery. Do not use any lotions or creams on the incision until instructed by your surgeon.  Take Xarelto for two and a half more weeks, then discontinue Xarelto. Once the patient has completed the Xarelto, they may resume the Plavix 75 mg daily.  Postoperative Constipation Protocol  Constipation - defined medically as fewer than three stools per week and severe constipation as less than one stool per week.  One of the most common issues patients have following surgery is constipation.  Even if you have a regular bowel pattern at home, your normal regimen is likely to be disrupted due to multiple reasons following surgery.  Combination of anesthesia, postoperative narcotics, change in appetite and fluid intake all can affect your bowels.  In order to avoid complications following surgery, here are some recommendations in order to help you during your recovery period.  Colace (docusate) - Pick up an over-the-counter form of Colace or another stool softener and  take twice a day as long as you are requiring postoperative pain medications.  Take with a full glass of water daily.  If you experience loose stools or diarrhea, hold the colace until you stool forms back up.  If your symptoms do not get better within 1 week or if they get worse, check with your doctor.  Dulcolax (bisacodyl) - Pick up over-the-counter and take as directed by the product packaging as needed to assist with the movement of your bowels.  Take with a full glass of water.  Use this product as needed if not relieved by Colace only.   MiraLax (polyethylene glycol) - Pick up over-the-counter to have on hand.  MiraLax is a solution that will increase the amount of water in your bowels to assist with bowel movements.  Take as directed and can mix with a glass of water, juice, soda, coffee, or tea.  Take if you go more than two days without a  movement. Do not use MiraLax more than once per day. Call your doctor if you are still constipated or irregular after using this medication for 7 days in a row.  If you continue to have problems with postoperative constipation, please contact the office for further assistance and recommendations.  If you experience "the worst abdominal pain ever" or develop nausea or vomiting, please contact the office immediatly for further recommendations for treatment.   When discharged from the skilled rehab facility, please have the facility set up the patient's [place] prior to being released.   Also provide the patient with their medications at time of release from the facility to include their pain medication, the muscle relaxants, and their blood thinner medication.  If the patient is still at the rehab facility at time of follow up appointment, please also assist the patient in arranging follow up appointment in our office and any transportation needs. ICE to the affected knee or hip every three hours for 30 minutes at a time and then as needed for pain and swelling.     Information on my medicine - XARELTO (Rivaroxaban)  This medication education was reviewed with me or my healthcare representative as part of my discharge preparation.  The pharmacist that spoke with me during my hospital stay was:  WOFFORD, DREW A, RPH  Why was Xarelto prescribed for you? Xarelto was prescribed for you to reduce the risk of blood clots forming after orthopedic surgery. The medical term for these abnormal blood clots is venous thromboembolism (VTE).  What do you need to know about xarelto ? Take your Xarelto ONCE DAILY at the same time every day. You may take it either with or without food.  If you have difficulty swallowing the tablet whole, you may crush it and mix in applesauce just prior to taking your dose.  Take Xarelto exactly as prescribed by your doctor and DO NOT stop taking  Xarelto without talking to the doctor who prescribed the medication.  Stopping without other VTE prevention medication to take the place of Xarelto may increase your risk of developing a clot.  After discharge, you should have regular check-up appointments with your healthcare provider that is prescribing your Xarelto.    What do you do if you miss a dose? If you miss a dose, take it as soon as you remember on the same day then continue your regularly scheduled once daily regimen the next day. Do not take two doses of Xarelto on the same day.  Information °A possible side  effect of Xarelto® is bleeding. You should call your healthcare provider right away if you experience any of the following: °? Bleeding from an injury or your nose that does not stop. °? Unusual colored urine (red or dark brown) or unusual colored stools (red or black). °? Unusual bruising for unknown reasons. °? A serious fall or if you hit your head (even if there is no bleeding). ° °Some medicines may interact with Xarelto® and might increase your risk of bleeding while on Xarelto®. To help avoid this, consult your healthcare provider or pharmacist prior to using any new prescription or non-prescription medications, including herbals, vitamins, non-steroidal anti-inflammatory drugs (NSAIDs) and supplements. ° °This website has more information on Xarelto®: www.xarelto.com. ° ° ° °

## 2014-01-28 NOTE — Progress Notes (Signed)
Clinical Social Work Department CLINICAL SOCIAL WORK PLACEMENT NOTE 01/28/2014  Patient:  [name] [name], [name] [name]  Account Number:  [id number] Admit date:  01/27/2014  Clinical Social Worker:  Werner Lean, LCSW  Date/time:  01/28/2014 02:27 PM  Clinical Social Work is seeking post-discharge placement for this patient at the following level of care:   SKILLED NURSING   (*CSW will update this form in Epic as items are completed)     Patient/family provided with [facility] Department of Clinical Social Work's list of facilities offering this level of care within the geographic area requested by the patient (or if unable, by the patient's family).  01/28/2014  Patient/family informed of their freedom to choose among providers that offer the needed level of care, that participate in Medicare, Medicaid or managed care program needed by the patient, have an available bed and are willing to accept the patient.    Patient/family informed of [facility]' ownership interest in [facility], as well as of the fact that they are under no obligation to receive care at this facility.  PASARR submitted to EDS on 01/28/2014 PASARR number received on 01/28/2014  FL2 transmitted to all facilities in geographic area requested by pt/family on  01/28/2014 FL2 transmitted to all facilities within larger geographic area on   Patient informed that his/her managed care company has contracts with or will negotiate with  certain facilities, including the following:     Patient/family informed of bed offers received:  01/28/2014 Patient chooses bed at [facility] Physician recommends and patient chooses bed at    Patient to be transferred to  on   Patient to be transferred to facility by  Patient and family notified of transfer on  Name of family member notified:    The following physician request were entered in Epic:   Additional Comments:  Werner Lean LCSW [phone]

## 2014-01-28 NOTE — Care Management Note (Signed)
    Page 1 of 1   01/28/2014     1:47:02 PM CARE MANAGEMENT NOTE 01/28/2014  Patient:  [name] [name], [name] [name]   Account Number:  [id number]  Date Initiated:  01/28/2014  Documentation initiated by:  [facility]  Subjective/Objective Assessment:   adm: LEFT TOTAL KNEE ARTHROPLASTY (Left)     Action/Plan:   SNF for rehab   Anticipated DC Date:  01/29/2014   Anticipated DC Plan:  [place]  CM consult      Choice offered to / List presented to:             Status of service:  Completed, signed off Medicare Important Message given?   (If response is "NO", the following Medicare IM given date fields will be blank) Date Medicare IM given:   Medicare IM given by:   Date Additional Medicare IM given:   Additional Medicare IM given by:    Discharge Disposition:  [place]  Per UR Regulation:    If discussed at Long Length of Stay Meetings, dates discussed:    Comments:  01/28/14 13:45 Cm notes pt to go to SNF ([facility]); CSW arranging.  No other CM needs were communicated.  Mariane Masters, BSN, Cm [phone].

## 2014-01-29 ENCOUNTER — Non-Acute Institutional Stay (SKILLED_NURSING_FACILITY): Payer: Medicare Other | Admitting: Registered Nurse

## 2014-01-29 ENCOUNTER — Encounter: Payer: Self-pay | Admitting: Registered Nurse

## 2014-01-29 DIAGNOSIS — F32A Depression, unspecified: Secondary | ICD-10-CM

## 2014-01-29 DIAGNOSIS — F5105 Insomnia due to other mental disorder: Secondary | ICD-10-CM | POA: Diagnosis not present

## 2014-01-29 DIAGNOSIS — Z96652 Presence of left artificial knee joint: Secondary | ICD-10-CM | POA: Diagnosis not present

## 2014-01-29 DIAGNOSIS — R682 Dry mouth, unspecified: Secondary | ICD-10-CM

## 2014-01-29 DIAGNOSIS — F419 Anxiety disorder, unspecified: Secondary | ICD-10-CM | POA: Diagnosis not present

## 2014-01-29 DIAGNOSIS — F329 Major depressive disorder, single episode, unspecified: Secondary | ICD-10-CM | POA: Diagnosis not present

## 2014-01-29 DIAGNOSIS — E876 Hypokalemia: Secondary | ICD-10-CM | POA: Diagnosis not present

## 2014-01-29 DIAGNOSIS — R278 Other lack of coordination: Secondary | ICD-10-CM | POA: Diagnosis not present

## 2014-01-29 DIAGNOSIS — Z471 Aftercare following joint replacement surgery: Secondary | ICD-10-CM | POA: Diagnosis not present

## 2014-01-29 DIAGNOSIS — K5901 Slow transit constipation: Secondary | ICD-10-CM | POA: Diagnosis not present

## 2014-01-29 DIAGNOSIS — Z79899 Other long term (current) drug therapy: Secondary | ICD-10-CM | POA: Diagnosis not present

## 2014-01-29 DIAGNOSIS — K219 Gastro-esophageal reflux disease without esophagitis: Secondary | ICD-10-CM

## 2014-01-29 DIAGNOSIS — M6281 Muscle weakness (generalized): Secondary | ICD-10-CM | POA: Diagnosis not present

## 2014-01-29 DIAGNOSIS — K5909 Other constipation: Secondary | ICD-10-CM | POA: Diagnosis not present

## 2014-01-29 DIAGNOSIS — E785 Hyperlipidemia, unspecified: Secondary | ICD-10-CM

## 2014-01-29 DIAGNOSIS — M1712 Unilateral primary osteoarthritis, left knee: Secondary | ICD-10-CM | POA: Diagnosis not present

## 2014-01-29 DIAGNOSIS — K5900 Constipation, unspecified: Secondary | ICD-10-CM

## 2014-01-29 DIAGNOSIS — D649 Anemia, unspecified: Secondary | ICD-10-CM | POA: Diagnosis not present

## 2014-01-29 DIAGNOSIS — D62 Acute posthemorrhagic anemia: Secondary | ICD-10-CM

## 2014-01-29 LAB — BASIC METABOLIC PANEL
ANION GAP: 6 (ref 5–15)
BUN: 9 mg/dL (ref 6–23)
CALCIUM: 8.4 mg/dL (ref 8.4–10.5)
CO2: 26 mmol/L (ref 19–32)
CREATININE: 0.62 mg/dL (ref 0.50–1.10)
Chloride: 107 mEq/L (ref 96–112)
GFR calc non Af Amer: 85 mL/min — ABNORMAL LOW (ref >90)
Glucose, Bld: 105 mg/dL — ABNORMAL HIGH (ref 70–99)
Potassium: 3.2 mmol/L — ABNORMAL LOW (ref 3.5–5.1)
Sodium: 139 mmol/L (ref 135–145)

## 2014-01-29 LAB — CBC
HCT: 30.1 % — ABNORMAL LOW (ref 36.0–46.0)
Hemoglobin: 9.8 g/dL — ABNORMAL LOW (ref 12.0–15.0)
MCH: 29.2 pg (ref 26.0–34.0)
MCHC: 32.6 g/dL (ref 30.0–36.0)
MCV: 89.6 fL (ref 78.0–100.0)
Platelets: 188 10*3/uL (ref 150–400)
RBC: 3.36 MIL/uL — ABNORMAL LOW (ref 3.87–5.11)
RDW: 13.9 % (ref 11.5–15.5)
WBC: 8 10*3/uL (ref 4.0–10.5)

## 2014-01-29 MED ORDER — ONDANSETRON HCL 4 MG PO TABS: 4.0000 mg | ORAL_TABLET | Freq: Four times a day (QID) | ORAL | Status: DC | PRN

## 2014-01-29 MED ORDER — DSS 100 MG PO CAPS: 100.0000 mg | ORAL_CAPSULE | Freq: Two times a day (BID) | ORAL | Status: DC

## 2014-01-29 MED ORDER — POLYETHYLENE GLYCOL 3350 17 G PO PACK: 17.0000 g | PACK | Freq: Every day | ORAL | Status: DC | PRN

## 2014-01-29 MED ORDER — ACETAMINOPHEN 325 MG PO TABS: 650.0000 mg | ORAL_TABLET | Freq: Four times a day (QID) | ORAL | Status: DC | PRN

## 2014-01-29 MED ORDER — POLYSACCHARIDE IRON COMPLEX 150 MG PO CAPS
150.0000 mg | ORAL_CAPSULE | Freq: Every day | ORAL | Status: DC
  Administered 2014-01-29: 150 mg via ORAL
  Filled 2014-01-29: qty 1

## 2014-01-29 MED ORDER — METHOCARBAMOL 500 MG PO TABS: 500.0000 mg | ORAL_TABLET | Freq: Four times a day (QID) | ORAL | Status: DC | PRN

## 2014-01-29 MED ORDER — OXYCODONE HCL 5 MG PO TABS: 5.0000 mg | ORAL_TABLET | ORAL | Status: DC | PRN

## 2014-01-29 MED ORDER — METOCLOPRAMIDE HCL 5 MG PO TABS: 5.0000 mg | ORAL_TABLET | Freq: Three times a day (TID) | ORAL | Status: DC | PRN

## 2014-01-29 MED ORDER — RIVAROXABAN 10 MG PO TABS: 10.0000 mg | ORAL_TABLET | Freq: Every day | ORAL | Status: DC

## 2014-01-29 MED ORDER — BISACODYL 10 MG RE SUPP: 10.0000 mg | Freq: Every day | RECTAL | Status: DC | PRN

## 2014-01-29 MED ORDER — POLYSACCHARIDE IRON COMPLEX 150 MG PO CAPS: 150.0000 mg | ORAL_CAPSULE | Freq: Every day | ORAL | Status: DC

## 2014-01-29 NOTE — Progress Notes (Signed)
Physical Therapy Treatment Patient Details Name: [name] [name] MRN: [id number] DOB: March 08, 1934 Today's Date: 01/29/2014    History of Present Illness 80 yo female s/p L TKA    PT Comments    Pt progressing well, pain limiting gait distance and knee flexion, pain meds requested  Follow Up Recommendations  SNF     Equipment Recommendations  None recommended by PT    Recommendations for Other Services       Precautions / Restrictions Precautions Precautions: Knee Precaution Comments: I SLR, KI not utilized Required Braces or Orthoses: Knee Immobilizer - Left Knee Immobilizer - Left: Discontinue once straight leg raise with < 10 degree lag Restrictions Weight Bearing Restrictions: No Other Position/Activity Restrictions: WBAT    Mobility  Bed Mobility Overal bed mobility: Needs Assistance Bed Mobility: Supine to Sit     Supine to sit: Min assist     General bed mobility comments: with LLE, cues for technique, self assist  Transfers Overall transfer level: Needs assistance Equipment used: Rolling walker (2 wheeled) Transfers: Sit to/from Stand Sit to Stand: Min guard;Min assist         General transfer comment: to rise, stabilize; cues for hand placement  Ambulation/Gait Ambulation/Gait assistance: Min guard;Min assist Ambulation Distance (Feet): 60 Feet Assistive device: Rolling walker (2 wheeled) Gait Pattern/deviations: Step-to pattern;Antalgic;Trunk flexed     General Gait Details: cues for sequence, step length   Stairs            Wheelchair Mobility    Modified Rankin (Stroke Patients Only)       Balance                                    Cognition Arousal/Alertness: Awake/alert Behavior During Therapy: WFL for tasks assessed/performed Overall Cognitive Status: Within Functional Limits for tasks assessed                      Exercises Total Joint Exercises Ankle Circles/Pumps: AROM;Both;10 reps Quad  Sets: AROM;Both;10 reps Heel Slides: AAROM;AROM;Left;10 reps Hip ABduction/ADduction: AROM;Strengthening;Left;10 reps Straight Leg Raises: AROM;Strengthening;Left;15 reps    General Comments        Pertinent Vitals/Pain Pain Assessment: 0-10 Pain Score: 7  Pain Location: L knee Pain Descriptors / Indicators: Constant Pain Intervention(s): Monitored during session;Limited activity within patient's tolerance;Patient requesting pain meds-RN notified;Ice applied    Home Living                      Prior Function            PT Goals (current goals can now be found in the care plan section) Acute Rehab PT Goals Patient Stated Goal: to return to I lifestyle PT Goal Formulation: With patient Time For Goal Achievement: 01/31/14 Potential to Achieve Goals: Good Progress towards PT goals: Progressing toward goals    Frequency  7X/week    PT Plan Current plan remains appropriate    Co-evaluation             End of Session Equipment Utilized During Treatment: Gait belt Activity Tolerance: Patient tolerated treatment well Patient left: in chair;with call bell/phone within reach     Time: 0930-0957 PT Time Calculation (min) (ACUTE ONLY): 27 min  Charges:  $Gait Training: 8-22 mins $Therapeutic Exercise: 8-22 mins  G CodesKenyon [name] 01/29/2014, 10:50 AM

## 2014-01-29 NOTE — Progress Notes (Signed)
Subjective: 2 Days Post-Op Procedure(s) (LRB): LEFT TOTAL KNEE ARTHROPLASTY (Left) Patient reports pain as mild and moderate.   Patient seen in rounds with Dr. Wynelle Link.  Doing a little better this morning. Patient is well, but has had some minor complaints of pain in the knee, requiring pain medications Patient is ready to go to [facility] where she has been before.  Objective: Vital signs in last 24 hours: Temp:  [98.2 F (36.8 C)-98.7 F (37.1 C)] 98.2 F (36.8 C) (01/13 0528) Pulse Rate:  [72-96] 96 (01/13 0528) Resp:  [16-18] 16 (01/13 0528) BP: (111-123)/(45-84) 120/50 mmHg (01/13 0528) SpO2:  [93 %-97 %] 95 % (01/13 0528)  Intake/Output from previous day:  Intake/Output Summary (Last 24 hours) at 01/29/14 0821 Last data filed at 01/29/14 0524  Gross per 24 hour  Intake    480 ml  Output   1150 ml  Net   -670 ml    Labs:  Recent Labs  01/28/14 0420 01/29/14 0405  HGB 10.5* 9.8*    Recent Labs  01/28/14 0420 01/29/14 0405  WBC 8.7 8.0  RBC 3.57* 3.36*  HCT 32.6* 30.1*  PLT 223 188    Recent Labs  01/28/14 0420 01/29/14 0405  NA 140 139  K 4.1 3.2*  CL 105 107  CO2 29 26  BUN 11 9  CREATININE 0.78 0.62  GLUCOSE 123* 105*  CALCIUM 8.5 8.4   No results for input(s): LABPT, INR in the last 72 hours.  EXAM: General - Patient is Alert, Appropriate and Oriented Extremity - Neurovascular intact Sensation intact distally Dorsiflexion/Plantar flexion intact Incision - clean, dry, no drainage Motor Function - intact, moving foot and toes well on exam.   Assessment/Plan: 2 Days Post-Op Procedure(s) (LRB): LEFT TOTAL KNEE ARTHROPLASTY (Left) Procedure(s) (LRB): LEFT TOTAL KNEE ARTHROPLASTY (Left) Past Medical History  Diagnosis Date  . Painful respiration   . Personal history of unspecified circulatory disease   . Palpitations   . Other and unspecified hyperlipidemia   . Esophageal reflux   . Depressive disorder, not elsewhere classified    . Anxiety state, unspecified   . Unspecified arthropathy, multiple sites   . Diaphragmatic hernia without mention of obstruction or gangrene   . External hemorrhoids without mention of complication   . Diverticulosis of colon (without mention of hemorrhage)   . Diarrhea   . Special screening for malignant neoplasms of other sites   . Unspecified vitamin D deficiency   . Other malaise and fatigue 08-11-11    hx. "chronic fatigue syndrome"  . Disorder of bone and cartilage, unspecified   . Helicobacter pylori (H. pylori)   . Macular degeneration (senile) of retina, unspecified   . Shortness of breath 08-11-11    with exertion only at present  . Unspecified cerebral artery occlusion with cerebral infarction 08-11-11    '97/ '06( TIA)-affected lt. side, no residual  . Gallstones 08-11-11    asymptomatic at present  . Personal history of other diseases of digestive system 08-11-11    "Irittable bowel syndrome"  . Urinary incontinence 08-11-11    wears peripad daily  . Osteoarthrosis, unspecified whether generalized or localized, unspecified site 08-11-11    hx. "rhematoid arthritis", osteoarthritis, DDD, bursitis(hip)  . Lumbar spondylosis   . Complication of anesthesia     slow to wake up with surgery several years ago   . PONV (postoperative nausea and vomiting)    Principal Problem:   OA (osteoarthritis) of knee  Estimated body  mass index is 32.62 kg/(m^2) as calculated from the following:   Height as of this encounter: 5\' 6"  (1.676 m).   Weight as of this encounter: 91.627 kg (202 lb). Up with therapy Discharge to SNF Diet - Cardiac diet Follow up - in 1 weeks Activity - WBAT Disposition - Skilled nursing facility Condition Upon Discharge - Good D/C Meds - See DC Summary DVT Prophylaxis - Xarelto  Arlee Muslim, PA-C Orthopaedic Surgery 01/29/2014, 8:21 AM

## 2014-01-29 NOTE — Discharge Summary (Signed)
Physician Discharge Summary   Patient ID: [name] [name] MRN: [id number] DOB/AGE: 1934/07/26 80 y.o.  Admit date: 01/27/2014 Discharge date: 01-29-2014  Primary Diagnosis:  Osteoarthritis Left knee(s) Admission Diagnoses:  Past Medical History  Diagnosis Date  . Painful respiration   . Personal history of unspecified circulatory disease   . Palpitations   . Other and unspecified hyperlipidemia   . Esophageal reflux   . Depressive disorder, not elsewhere classified   . Anxiety state, unspecified   . Unspecified arthropathy, multiple sites   . Diaphragmatic hernia without mention of obstruction or gangrene   . External hemorrhoids without mention of complication   . Diverticulosis of colon (without mention of hemorrhage)   . Diarrhea   . Special screening for malignant neoplasms of other sites   . Unspecified vitamin D deficiency   . Other malaise and fatigue 08-11-11    hx. "chronic fatigue syndrome"  . Disorder of bone and cartilage, unspecified   . Helicobacter pylori (H. pylori)   . Macular degeneration (senile) of retina, unspecified   . Shortness of breath 08-11-11    with exertion only at present  . Unspecified cerebral artery occlusion with cerebral infarction 08-11-11    '97/ '06( TIA)-affected lt. side, no residual  . Gallstones 08-11-11    asymptomatic at present  . Personal history of other diseases of digestive system 08-11-11    "Irittable bowel syndrome"  . Urinary incontinence 08-11-11    wears peripad daily  . Osteoarthrosis, unspecified whether generalized or localized, unspecified site 08-11-11    hx. "rhematoid arthritis", osteoarthritis, DDD, bursitis(hip)  . Lumbar spondylosis   . Complication of anesthesia     slow to wake up with surgery several years ago   . PONV (postoperative nausea and vomiting)    Discharge Diagnoses:   Principal Problem:   OA (osteoarthritis) of knee  Estimated body mass index is 32.62 kg/(m^2) as calculated from the  following:   Height as of this encounter: 5' 6"  (1.676 m).   Weight as of this encounter: 91.627 kg (202 lb).  Procedure:  Procedure(s) (LRB): LEFT TOTAL KNEE ARTHROPLASTY (Left)   Consults: None  HPI: [name] [name] is a 80 y.o. year old female with end stage OA of her left knee with progressively worsening pain and dysfunction. She has constant pain, with activity and at rest and significant functional deficits with difficulties even with ADLs. She has had extensive non-op management including analgesics, injections of cortisone and viscosupplements, and home exercise program, but remains in significant pain with significant dysfunction. Radiographs show bone on bone arthritis lateral and patellofemoral. She presents now for left Total Knee Arthroplasty.  Laboratory Data: Admission on 01/27/2014  Component Date Value Ref Range Status  . WBC 01/28/2014 8.7  4.0 - 10.5 K/uL Final  . RBC 01/28/2014 3.57* 3.87 - 5.11 MIL/uL Final  . Hemoglobin 01/28/2014 10.5* 12.0 - 15.0 g/dL Final  . HCT 01/28/2014 32.6* 36.0 - 46.0 % Final  . MCV 01/28/2014 91.3  78.0 - 100.0 fL Final  . MCH 01/28/2014 29.4  26.0 - 34.0 pg Final  . MCHC 01/28/2014 32.2  30.0 - 36.0 g/dL Final  . RDW 01/28/2014 13.9  11.5 - 15.5 % Final  . Platelets 01/28/2014 223  150 - 400 K/uL Final  . Sodium 01/28/2014 140  135 - 145 mmol/L Final   Please note change in reference range.  . Potassium 01/28/2014 4.1  3.5 - 5.1 mmol/L Final   Please note change in  reference range.  . Chloride 01/28/2014 105  96 - 112 mEq/L Final  . CO2 01/28/2014 29  19 - 32 mmol/L Final  . Glucose, Bld 01/28/2014 123* 70 - 99 mg/dL Final  . BUN 01/28/2014 11  6 - 23 mg/dL Final  . Creatinine, Ser 01/28/2014 0.78  0.50 - 1.10 mg/dL Final  . Calcium 01/28/2014 8.5  8.4 - 10.5 mg/dL Final  . GFR calc non Af Amer 01/28/2014 79* >90 mL/min Final  . GFR calc Af Amer 01/28/2014 >90  >90 mL/min Final   Comment: (NOTE) The eGFR has been calculated  using the CKD EPI equation. This calculation has not been validated in all clinical situations. eGFR's persistently <90 mL/min signify possible Chronic Kidney Disease.   . Anion gap 01/28/2014 6  5 - 15 Final  . WBC 01/29/2014 8.0  4.0 - 10.5 K/uL Final  . RBC 01/29/2014 3.36* 3.87 - 5.11 MIL/uL Final  . Hemoglobin 01/29/2014 9.8* 12.0 - 15.0 g/dL Final  . HCT 01/29/2014 30.1* 36.0 - 46.0 % Final  . MCV 01/29/2014 89.6  78.0 - 100.0 fL Final  . MCH 01/29/2014 29.2  26.0 - 34.0 pg Final  . MCHC 01/29/2014 32.6  30.0 - 36.0 g/dL Final  . RDW 01/29/2014 13.9  11.5 - 15.5 % Final  . Platelets 01/29/2014 188  150 - 400 K/uL Final  . Sodium 01/29/2014 139  135 - 145 mmol/L Final   Please note change in reference range.  . Potassium 01/29/2014 3.2* 3.5 - 5.1 mmol/L Final   Comment: Please note change in reference range. RESULT REPEATED AND VERIFIED DELTA CHECK NOTED   . Chloride 01/29/2014 107  96 - 112 mEq/L Final  . CO2 01/29/2014 26  19 - 32 mmol/L Final  . Glucose, Bld 01/29/2014 105* 70 - 99 mg/dL Final  . BUN 01/29/2014 9  6 - 23 mg/dL Final  . Creatinine, Ser 01/29/2014 0.62  0.50 - 1.10 mg/dL Final  . Calcium 01/29/2014 8.4  8.4 - 10.5 mg/dL Final  . GFR calc non Af Amer 01/29/2014 85* >90 mL/min Final  . GFR calc Af Amer 01/29/2014 >90  >90 mL/min Final   Comment: (NOTE) The eGFR has been calculated using the CKD EPI equation. This calculation has not been validated in all clinical situations. eGFR's persistently <90 mL/min signify possible Chronic Kidney Disease.   Georgiann Hahn gap 01/29/2014 6  5 - 15 Final  Hospital Outpatient Visit on 01/20/2014  Component Date Value Ref Range Status  . aPTT 01/20/2014 28  24 - 37 seconds Final  . WBC 01/20/2014 6.2  4.0 - 10.5 K/uL Final  . RBC 01/20/2014 4.71  3.87 - 5.11 MIL/uL Final  . Hemoglobin 01/20/2014 13.5  12.0 - 15.0 g/dL Final  . HCT 01/20/2014 43.0  36.0 - 46.0 % Final  . MCV 01/20/2014 91.3  78.0 - 100.0 fL Final  . MCH  01/20/2014 28.7  26.0 - 34.0 pg Final  . MCHC 01/20/2014 31.4  30.0 - 36.0 g/dL Final  . RDW 01/20/2014 13.9  11.5 - 15.5 % Final  . Platelets 01/20/2014 293  150 - 400 K/uL Final  . Sodium 01/20/2014 139  135 - 145 mmol/L Final   Please note change in reference range.  . Potassium 01/20/2014 4.8  3.5 - 5.1 mmol/L Final   Please note change in reference range.  . Chloride 01/20/2014 106  96 - 112 mEq/L Final  . CO2 01/20/2014 27  19 - 32 mmol/L  Final  . Glucose, Bld 01/20/2014 97  70 - 99 mg/dL Final  . BUN 01/20/2014 16  6 - 23 mg/dL Final  . Creatinine, Ser 01/20/2014 1.14* 0.50 - 1.10 mg/dL Final  . Calcium 01/20/2014 9.3  8.4 - 10.5 mg/dL Final  . Total Protein 01/20/2014 7.6  6.0 - 8.3 g/dL Final  . Albumin 01/20/2014 4.7  3.5 - 5.2 g/dL Final  . AST 01/20/2014 29  0 - 37 U/L Final  . ALT 01/20/2014 17  0 - 35 U/L Final  . Alkaline Phosphatase 01/20/2014 77  39 - 117 U/L Final  . Total Bilirubin 01/20/2014 0.6  0.3 - 1.2 mg/dL Final  . GFR calc non Af Amer 01/20/2014 45* >90 mL/min Final  . GFR calc Af Amer 01/20/2014 52* >90 mL/min Final   Comment: (NOTE) The eGFR has been calculated using the CKD EPI equation. This calculation has not been validated in all clinical situations. eGFR's persistently <90 mL/min signify possible Chronic Kidney Disease.   . Anion gap 01/20/2014 6  5 - 15 Final  . Prothrombin Time 01/20/2014 13.1  11.6 - 15.2 seconds Final  . INR 01/20/2014 0.98  0.00 - 1.49 Final  . ABO/RH(D) 01/20/2014 B POS   Final  . Antibody Screen 01/20/2014 NEG   Final  . Sample Expiration 01/20/2014 01/30/2014   Final  . Color, Urine 01/20/2014 YELLOW  YELLOW Final  . APPearance 01/20/2014 CLEAR  CLEAR Final  . Specific Gravity, Urine 01/20/2014 1.026  1.005 - 1.030 Final  . pH 01/20/2014 5.0  5.0 - 8.0 Final  . Glucose, UA 01/20/2014 NEGATIVE  NEGATIVE mg/dL Final  . Hgb urine dipstick 01/20/2014 NEGATIVE  NEGATIVE Final  . Bilirubin Urine 01/20/2014 NEGATIVE   NEGATIVE Final  . Ketones, ur 01/20/2014 NEGATIVE  NEGATIVE mg/dL Final  . Protein, ur 01/20/2014 NEGATIVE  NEGATIVE mg/dL Final  . Urobilinogen, UA 01/20/2014 0.2  0.0 - 1.0 mg/dL Final  . Nitrite 01/20/2014 NEGATIVE  NEGATIVE Final  . Leukocytes, UA 01/20/2014 NEGATIVE  NEGATIVE Final   MICROSCOPIC NOT DONE ON URINES WITH NEGATIVE PROTEIN, BLOOD, LEUKOCYTES, NITRITE, OR GLUCOSE <1000 mg/dL.  Marland Kitchen MRSA, PCR 01/20/2014 NEGATIVE  NEGATIVE Final  . Staphylococcus aureus 01/20/2014 NEGATIVE  NEGATIVE Final   Comment:        The Xpert SA Assay (FDA approved for NASAL specimens in patients over 41 years of age), is one component of a comprehensive surveillance program.  Test performance has been validated by [employer] for patients greater than or equal to 78 year old. It is not intended to diagnose infection nor to guide or monitor treatment.   Office Visit on 12/27/2013  Component Date Value Ref Range Status  . Sodium 12/27/2013 136  135 - 145 mEq/L Final  . Potassium 12/27/2013 4.4  3.5 - 5.1 mEq/L Final  . Chloride 12/27/2013 104  96 - 112 mEq/L Final  . CO2 12/27/2013 26  19 - 32 mEq/L Final  . Glucose, Bld 12/27/2013 96  70 - 99 mg/dL Final  . BUN 12/27/2013 14  6 - 23 mg/dL Final  . Creatinine, Ser 12/27/2013 0.9  0.4 - 1.2 mg/dL Final  . Total Bilirubin 12/27/2013 0.5  0.2 - 1.2 mg/dL Final  . Alkaline Phosphatase 12/27/2013 62  39 - 117 U/L Final  . AST 12/27/2013 22  0 - 37 U/L Final  . ALT 12/27/2013 18  0 - 35 U/L Final  . Total Protein 12/27/2013 7.1  6.0 - 8.3 g/dL  Final  . Albumin 12/27/2013 4.2  3.5 - 5.2 g/dL Final  . Calcium 12/27/2013 9.4  8.4 - 10.5 mg/dL Final  . GFR 12/27/2013 66.24  >60.00 mL/min Final  . WBC 12/27/2013 5.4  4.0 - 10.5 K/uL Final  . RBC 12/27/2013 4.84  3.87 - 5.11 Mil/uL Final  . Hemoglobin 12/27/2013 13.9  12.0 - 15.0 g/dL Final  . HCT 12/27/2013 42.8  36.0 - 46.0 % Final  . MCV 12/27/2013 88.3  78.0 - 100.0 fl Final  . MCHC 12/27/2013  32.5  30.0 - 36.0 g/dL Final  . RDW 12/27/2013 14.4  11.5 - 15.5 % Final  . Platelets 12/27/2013 277.0  150.0 - 400.0 K/uL Final  . Neutrophils Relative % 12/27/2013 55.2  43.0 - 77.0 % Final  . Lymphocytes Relative 12/27/2013 33.8  12.0 - 46.0 % Final  . Monocytes Relative 12/27/2013 8.1  3.0 - 12.0 % Final  . Eosinophils Relative 12/27/2013 2.1  0.0 - 5.0 % Final  . Basophils Relative 12/27/2013 0.8  0.0 - 3.0 % Final  . Neutro Abs 12/27/2013 3.0  1.4 - 7.7 K/uL Final  . Lymphs Abs 12/27/2013 1.8  0.7 - 4.0 K/uL Final  . Monocytes Absolute 12/27/2013 0.4  0.1 - 1.0 K/uL Final  . Eosinophils Absolute 12/27/2013 0.1  0.0 - 0.7 K/uL Final  . Basophils Absolute 12/27/2013 0.0  0.0 - 0.1 K/uL Final  . Cholesterol 12/27/2013 179  0 - 200 mg/dL Final   ATP III Classification       Desirable:  < 200 mg/dL               Borderline High:  200 - 239 mg/dL          High:  > = 240 mg/dL  . Triglycerides 12/27/2013 100.0  0.0 - 149.0 mg/dL Final   Normal:  <150 mg/dLBorderline High:  150 - 199 mg/dL  . HDL 12/27/2013 79.60  >39.00 mg/dL Final  . VLDL 12/27/2013 20.0  0.0 - 40.0 mg/dL Final  . LDL Cholesterol 12/27/2013 79  0 - 99 mg/dL Final  . Total CHOL/HDL Ratio 12/27/2013 2   Final                  Men          Women1/2 Average Risk     3.4          3.3Average Risk          5.0          4.42X Average Risk          9.6          7.13X Average Risk          15.0          11.0                      . NonHDL 12/27/2013 99.40   Final   NOTE:  Non-HDL goal should be 30 mg/dL higher than patient's LDL goal (i.e. LDL goal of < 70 mg/dL, would have non-HDL goal of < 100 mg/dL)  . VITD 12/27/2013 47.73  30.00 - 100.00 ng/mL Final  . TSH 12/27/2013 2.46  0.35 - 4.50 uIU/mL Final     X-Rays:Dg Chest 2 View  01/20/2014   CLINICAL DATA:  Preop knee surgery.  No chest complaints.  EXAM: CHEST  2 VIEW  COMPARISON:  08/19/2011  FINDINGS: Cardiomegaly. Clear lung fields. Degenerative change  thoracic spine. No  pleural effusion or pneumothorax.  IMPRESSION: Cardiomegaly.  No active disease.  Improved appearance from priors.   Electronically Signed   By: Rolla Flatten M.D.   On: 01/20/2014 15:05    EKG: Orders placed or performed in visit on 08/15/13  . EKG 12-Lead     Hospital Course: [name] [name] is a 80 y.o. who was admitted to [facility]. They were brought to the operating room on 01/27/2014 and underwent Procedure(s): LEFT TOTAL KNEE ARTHROPLASTY.  Patient tolerated the procedure well and was later transferred to the recovery room and then to the orthopaedic floor for postoperative care.  They were given PO and IV analgesics for pain control following their surgery.  They were given 24 hours of postoperative antibiotics of  Anti-infectives    Start     Dose/Rate Route Frequency Ordered Stop   01/27/14 1300  ceFAZolin (ANCEF) IVPB 2 g/50 mL premix     2 g100 mL/hr over 30 Minutes Intravenous Every 6 hours 01/27/14 1033 01/27/14 1848   01/27/14 0535  ceFAZolin (ANCEF) IVPB 2 g/50 mL premix     2 g100 mL/hr over 30 Minutes Intravenous On call to O.R. 01/27/14 4944 01/27/14 0719     and started on DVT prophylaxis in the form of Xarelto.   PT and OT were ordered for total joint protocol.  Discharge planning consulted to help with postop disposition and equipment needs. Social worker got involved to assist with placement of patient into skilled rehab.   Patient had a good night on the evening of surgery. Walked 20 feet yesterday following surgery.  They started to get up OOB with therapy on day one. Hemovac drain was pulled without difficulty.  Continued to work with therapy into day two.  Dressing was changed on day two and the incision was healing well.  Patient was seen in rounds by Dr. Wynelle Link and was ready to go home.  Discharge to SNF Diet - Cardiac diet Follow up - in 1 weeks Activity - WBAT Disposition - Skilled nursing facility Condition Upon Discharge - Good D/C Meds - See DC  Summary DVT Prophylaxis - Xarelto      Discharge Instructions    Call MD / Call 911    Complete by:  As directed   If you experience chest pain or shortness of breath, CALL 911 and be transported to the hospital emergency room.  If you develope a fever above 101 F, pus (white drainage) or increased drainage or redness at the wound, or calf pain, call your surgeon's office.     Change dressing    Complete by:  As directed   Change dressing daily with sterile 4 x 4 inch gauze dressing and apply TED hose. Do not submerge the incision under water.     Constipation Prevention    Complete by:  As directed   Drink plenty of fluids.  Prune juice may be helpful.  You may use a stool softener, such as Colace (over the counter) 100 mg twice a day.  Use MiraLax (over the counter) for constipation as needed.     Diet - low sodium heart healthy    Complete by:  As directed      Discharge instructions    Complete by:  As directed   Pick up stool softner and laxative for home use following surgery while on pain medications. Do not submerge incision under water. Please use good hand washing techniques while changing dressing  each day. May shower starting three days after surgery. Please use a clean towel to pat the incision dry following showers. Continue to use ice for pain and swelling after surgery. Do not use any lotions or creams on the incision until instructed by your surgeon.  Take Xarelto for two and a half more weeks, then discontinue Xarelto. Once the patient has completed the Xarelto, they may resume the Plavix 75 mg daily.  Postoperative Constipation Protocol  Constipation - defined medically as fewer than three stools per week and severe constipation as less than one stool per week.  One of the most common issues patients have following surgery is constipation.  Even if you have a regular bowel pattern at home, your normal regimen is likely to be disrupted due to multiple reasons  following surgery.  Combination of anesthesia, postoperative narcotics, change in appetite and fluid intake all can affect your bowels.  In order to avoid complications following surgery, here are some recommendations in order to help you during your recovery period.  Colace (docusate) - Pick up an over-the-counter form of Colace or another stool softener and take twice a day as long as you are requiring postoperative pain medications.  Take with a full glass of water daily.  If you experience loose stools or diarrhea, hold the colace until you stool forms back up.  If your symptoms do not get better within 1 week or if they get worse, check with your doctor.  Dulcolax (bisacodyl) - Pick up over-the-counter and take as directed by the product packaging as needed to assist with the movement of your bowels.  Take with a full glass of water.  Use this product as needed if not relieved by Colace only.   MiraLax (polyethylene glycol) - Pick up over-the-counter to have on hand.  MiraLax is a solution that will increase the amount of water in your bowels to assist with bowel movements.  Take as directed and can mix with a glass of water, juice, soda, coffee, or tea.  Take if you go more than two days without a movement. Do not use MiraLax more than once per day. Call your doctor if you are still constipated or irregular after using this medication for 7 days in a row.  If you continue to have problems with postoperative constipation, please contact the office for further assistance and recommendations.  If you experience "the worst abdominal pain ever" or develop nausea or vomiting, please contact the office immediatly for further recommendations for treatment.  When discharged from the skilled rehab facility, please have the facility set up the patient's [place] prior to being released.   Also provide the patient with their medications at time of release from the facility to include their  pain medication, the muscle relaxants, and their blood thinner medication.  If the patient is still at the rehab facility at time of follow up appointment, please also assist the patient in arranging follow up appointment in our office and any transportation needs. ICE to the affected knee or hip every three hours for 30 minutes at a time and then as needed for pain and swelling.     Do not put a pillow under the knee. Place it under the heel.    Complete by:  As directed      Do not sit on low chairs, stoools or toilet seats, as it may be difficult to get up from low surfaces    Complete by:  As  directed      Driving restrictions    Complete by:  As directed   No driving until released by the physician.     Increase activity slowly as tolerated    Complete by:  As directed      Lifting restrictions    Complete by:  As directed   No lifting until released by the physician.     Patient may shower    Complete by:  As directed   You may shower without a dressing once there is no drainage.  Do not wash over the wound.  If drainage remains, do not shower until drainage stops.     TED hose    Complete by:  As directed   Use stockings (TED hose) for 3 weeks on both leg(s).  You may remove them at night for sleeping.     Weight bearing as tolerated    Complete by:  As directed   Laterality:  left  Extremity:  Lower            Medication List    STOP taking these medications        clopidogrel 75 MG tablet  Commonly known as:  PLAVIX     cyclobenzaprine 10 MG tablet  Commonly known as:  FLEXERIL     HYDROcodone-acetaminophen 10-325 MG per tablet  Commonly known as:  NORCO     HYDROcodone-acetaminophen 5-325 MG per tablet  Commonly known as:  NORCO/VICODIN     raloxifene 60 MG tablet  Commonly known as:  EVISTA     sulfamethoxazole-trimethoprim 800-160 MG per tablet  Commonly known as:  BACTRIM DS,SEPTRA DS      TAKE these medications        acetaminophen 325 MG tablet    Commonly known as:  TYLENOL  Take 2 tablets (650 mg total) by mouth every 6 (six) hours as needed for mild pain (or Fever >/= 101).     ALPRAZolam 0.5 MG tablet  Commonly known as:  XANAX  Take 1 tablet (0.5 mg total) by mouth at bedtime as needed for sleep.     atorvastatin 10 MG tablet  Commonly known as:  LIPITOR  TAKE 1 TABLET DAILY     bisacodyl 10 MG suppository  Commonly known as:  DULCOLAX  Place 1 suppository (10 mg total) rectally daily as needed for moderate constipation.     DSS 100 MG Caps  Take 100 mg by mouth 2 (two) times daily.     iron polysaccharides 150 MG capsule  Commonly known as:  NIFEREX  Take 1 capsule (150 mg total) by mouth daily.     methocarbamol 500 MG tablet  Commonly known as:  ROBAXIN  Take 1 tablet (500 mg total) by mouth every 6 (six) hours as needed for muscle spasms.     metoCLOPramide 5 MG tablet  Commonly known as:  REGLAN  Take 1 tablet (5 mg total) by mouth every 8 (eight) hours as needed for nausea (if ondansetron (ZOFRAN) ineffective.).     NEXIUM 40 MG capsule  Generic drug:  esomeprazole  TAKE 1 CAPSULE TWICE A DAY     ondansetron 4 MG tablet  Commonly known as:  ZOFRAN  Take 1 tablet (4 mg total) by mouth every 6 (six) hours as needed for nausea.     oxyCODONE 5 MG immediate release tablet  Commonly known as:  Oxy IR/ROXICODONE  Take 1-2 tablets (5-10 mg total) by mouth every 3 (three) hours as  needed for moderate pain, severe pain or breakthrough pain.     polyethylene glycol packet  Commonly known as:  MIRALAX / GLYCOLAX  Take 17 g by mouth daily as needed for mild constipation.     rivaroxaban 10 MG Tabs tablet  Commonly known as:  XARELTO  - Take 1 tablet (10 mg total) by mouth daily with breakfast. Take Xarelto for two and a half more weeks, then discontinue Xarelto.  - Once the patient has completed the Xarelto, they may resume the Plavix 75 mg daily.     sertraline 100 MG tablet  Commonly known as:  ZOLOFT   TAKE 2 TABLETS DAILY       Follow-up Information    Follow up with Gearlean Alf, MD. Schedule an appointment as soon as possible for a visit on 02/04/2014.   Specialty:  Orthopedic Surgery   Why:  Call office ASAP at [phone] to set up appointment on Tuesday 02/04/2014 with Dr. Wynelle Link.   Contact information:   [address] [place] [address] [phone]       Signed: Arlee Muslim, PA-C Orthopaedic Surgery 01/29/2014, 8:31 AM

## 2014-01-29 NOTE — Progress Notes (Addendum)
Patient ID: [name] [name], female   DOB: [birth year], 80 y.o.   MRN: [id number]   Place of Service: [facility]  Allergies  Allergen Reactions  . Alendronate Sodium     REACTION: JAW PAIN  . Celecoxib     REACTION: GI UPSET  . Latex     Code Status: Full Code  Goals of Care: Longevity/STR  Chief Complaint  Patient presents with  . Hospitalization Follow-up    HPI 80 y.o. female with PMH of OA, depression, GERD, HLD, anxiety, insomnia among many others is being seen for a follow-up visit post hospital admission from 01/27/14 to 01/29/14 for left total knee replacement. She is here for STR and her goal is to return home. Seen in room today. Family at bedside. Reported pain is adequately controlled with current regimen. Has not has BM since 1/9 or 1/10. Also complaining of dry mouth. No additional complaints reported.   Review of Systems Constitutional: Negative for fever, chills, and fatigue. HENT: Negative for ear pain, congestion, and sore throat Eyes: Negative for eye pain, eye discharge, and visual disturbance  Cardiovascular: Negative for chest pain, palpitations, and leg swelling Respiratory: Negative cough, shortness of breath, and wheezing.  Gastrointestinal: Negative for nausea and vomiting. Negative for abdominal pain. Positive for constipation Genitourinary: Negative for  dysuria and hematuria Endocrine: Negative for polydipsia, polyphagia, and polyuria Musculoskeletal: Positive for back spasms (ongoing). Positive for post op left knee pain Neurological: Negative for dizziness and headache Skin: Negative for rash and pruritus.   Psychiatric: Negative for depression    Past Medical History  Diagnosis Date  . Painful respiration   . Personal history of unspecified circulatory disease   . Palpitations   . Other and unspecified hyperlipidemia   . Esophageal reflux   . Depressive disorder, not elsewhere classified   . Anxiety state, unspecified   .  Unspecified arthropathy, multiple sites   . Diaphragmatic hernia without mention of obstruction or gangrene   . External hemorrhoids without mention of complication   . Diverticulosis of colon (without mention of hemorrhage)   . Diarrhea   . Special screening for malignant neoplasms of other sites   . Unspecified vitamin D deficiency   . Other malaise and fatigue 08-11-11    hx. "chronic fatigue syndrome"  . Disorder of bone and cartilage, unspecified   . Helicobacter pylori (H. pylori)   . Macular degeneration (senile) of retina, unspecified   . Shortness of breath 08-11-11    with exertion only at present  . Unspecified cerebral artery occlusion with cerebral infarction 08-11-11    '97/ '06( TIA)-affected lt. side, no residual  . Gallstones 08-11-11    asymptomatic at present  . Personal history of other diseases of digestive system 08-11-11    "Irittable bowel syndrome"  . Urinary incontinence 08-11-11    wears peripad daily  . Osteoarthrosis, unspecified whether generalized or localized, unspecified site 08-11-11    hx. "rhematoid arthritis", osteoarthritis, DDD, bursitis(hip)  . Lumbar spondylosis   . Complication of anesthesia     slow to wake up with surgery several years ago   . PONV (postoperative nausea and vomiting)     Past Surgical History  Procedure Laterality Date  . Appendectomy    . Tubal ligation    . Knee surgery  08-11-11    rt. knee scope  . Cataract extraction  08-11-11    Bilateral  . Back surgery  08-11-11    '11-hx. lumbar fusion with  retained hardware  . Abdominal hysterectomy  08-11-11  . Total knee arthroplasty  08/16/2011    Procedure: TOTAL KNEE ARTHROPLASTY;  Surgeon: Gearlean Alf, MD;  Location: [facility];  Service: Orthopedics;  Laterality: Right;  . Total knee arthroplasty Left 01/27/2014    Procedure: LEFT TOTAL KNEE ARTHROPLASTY;  Surgeon: Gearlean Alf, MD;  Location: [facility];  Service: Orthopedics;  Laterality: Left;    History  Substance Use  Topics  . Smoking status: Never Smoker   . Smokeless tobacco: Never Used  . Alcohol Use: No    Family History  Problem Relation Age of Onset  . Heart disease Father   . Diabetes Sister   . Coronary artery disease Brother   . Diabetes Brother   . Heart disease Brother   . Pancreatic cancer Daughter   . Colon cancer Neg Hx       Medication List       This list is accurate as of: 01/29/14 11:59 PM.  Always use your most recent med list.               acetaminophen 325 MG tablet  Commonly known as:  TYLENOL  Take 2 tablets (650 mg total) by mouth every 6 (six) hours as needed for mild pain (or Fever >/= 101).     ALPRAZolam 0.5 MG tablet  Commonly known as:  XANAX  Take 1 tablet (0.5 mg total) by mouth at bedtime as needed for sleep.     atorvastatin 10 MG tablet  Commonly known as:  LIPITOR  TAKE 1 TABLET DAILY     bisacodyl 10 MG suppository  Commonly known as:  DULCOLAX  Place 1 suppository (10 mg total) rectally daily as needed for moderate constipation.     DSS 100 MG Caps  Take 100 mg by mouth 2 (two) times daily.     iron polysaccharides 150 MG capsule  Commonly known as:  NIFEREX  Take 1 capsule (150 mg total) by mouth daily.     methocarbamol 500 MG tablet  Commonly known as:  ROBAXIN  Take 1 tablet (500 mg total) by mouth every 6 (six) hours as needed for muscle spasms.     metoCLOPramide 5 MG tablet  Commonly known as:  REGLAN  Take 1 tablet (5 mg total) by mouth every 8 (eight) hours as needed for nausea (if ondansetron (ZOFRAN) ineffective.).     oxyCODONE 5 MG immediate release tablet  Commonly known as:  Oxy IR/ROXICODONE  Take 1-2 tablets (5-10 mg total) by mouth every 3 (three) hours as needed for moderate pain, severe pain or breakthrough pain.     polyethylene glycol packet  Commonly known as:  MIRALAX / GLYCOLAX  Take 17 g by mouth daily.     rivaroxaban 10 MG Tabs tablet  Commonly known as:  XARELTO  - Take 1 tablet (10 mg total) by  mouth daily with breakfast. Take Xarelto for two and a half more weeks, then discontinue Xarelto.  - Once the patient has completed the Xarelto, they may resume the Plavix 75 mg daily.        Physical Exam  BP 111/67 mmHg  Pulse 94  Temp(Src) 97.9 F (36.6 C)  Resp 20  Ht 5\' 5"  (1.651 m)  Wt 215 lb (97.523 kg)  BMI 35.78 kg/m2  Constitutional: WDWN elderly female in no acute distress. Conversant and pleasant HEENT: Normocephalic and atraumatic. PERRL. EOM intact. No icterus.  No nasal discharge or sinus tenderness.  Oral mucosa dry. Posterior pharynx clear of any exudate or lesions.  Neck: Supple and nontender. No lymphadenopathy, masses, or thyromegaly. No JVD or carotid bruits. Cardiac: Normal S1, S2. RRR without appreciable murmurs, rubs, or gallops. Distal pulses intact. No dependent edema.  Lungs: No respiratory distress. Breath sounds clear bilaterally without rales, rhonchi, or wheezes. Abdomen: Audible bowel sounds in all quadrants. Soft, nontender, nondistended. No palpable mass.  Musculoskeletal: able to move all extremities. Limited ROM of LLE. Generalized weakness present Skin: Warm and dry. No rash noted. Left knee surgical wound w/o signs of infection Neurological: Alert and oriented to person, place, and time. No focal deficits.  Psychiatric: Judgment and insight adequate. Appropriate mood and affect.   Labs Reviewed  CBC Latest Ref Rng 01/29/2014 01/28/2014 01/20/2014  WBC 4.0 - 10.5 K/uL 8.0 8.7 6.2  Hemoglobin 12.0 - 15.0 g/dL 9.8(L) 10.5(L) 13.5  Hematocrit 36.0 - 46.0 % 30.1(L) 32.6(L) 43.0  Platelets 150 - 400 K/uL 188 223 293    CMP Latest Ref Rng 01/29/2014 01/28/2014 01/20/2014  Glucose 70 - 99 mg/dL 105(H) 123(H) 97  BUN 6 - 23 mg/dL 9 11 16   Creatinine 0.50 - 1.10 mg/dL 0.62 0.78 1.14(H)  Sodium 135 - 145 mmol/L 139 140 139  Potassium 3.5 - 5.1 mmol/L 3.2(L) 4.1 4.8  Chloride 96 - 112 mEq/L 107 105 106  CO2 19 - 32 mmol/L 26 29 27   Calcium 8.4 - 10.5  mg/dL 8.4 8.5 9.3  Total Protein 6.0 - 8.3 g/dL - - 7.6  Total Bilirubin 0.3 - 1.2 mg/dL - - 0.6  Alkaline Phos 39 - 117 U/L - - 77  AST 0 - 37 U/L - - 29  ALT 0 - 35 U/L - - 17     Lab Results  Component Value Date   TSH 2.46 12/27/2013    Lipid Panel     Component Value Date/Time   CHOL 179 12/27/2013 1205   TRIG 100.0 12/27/2013 1205   HDL 79.60 12/27/2013 1205   CHOLHDL 2 12/27/2013 1205   VLDL 20.0 12/27/2013 1205   LDLCALC 79 12/27/2013 1205    Assessment & Plan 1. Status post left knee replacement Continue to work with PT/OT for gait/strength/balance training to restore/maximize function. Pain is adequately controlled for now. Continue oxy IR 5mg  1-2 tablets every 3 hours as needed and tylenol 650mg  every six hours as needed for pain. Continue robaxin 500mg  every six hours as needed for muscle spasms. Continue xarelto 10mg  daily for another 2.5 weeks for DVT prophylaxis. Resume plavix 75mg  daily once she has completed the xarelto. Continue to f/u with ortho  2. Constipation, unspecified constipation type Continue colace 100mg  twice daily and dulcolax 10mg  suppository daily as needed. Will change miralax from daily prn to daily. Nursing staff to give patient Dulcolax suppository x 1 tonight. If unsuccessful, fleet enema x1. Encourage hydration and activities as tolerated. Continue to monitor  3. Insomnia secondary to anxiety Continue xanax  0.5mg  daily at bedtime as needed for sleep.   4. HLD (hyperlipidemia) LDL 79. Continue lipitor 10mg  daily and monitor  5. Dry mouth ACT dry mouth lozenges prn. May leave at patient's bedside. Encourage hydration.   6. Hypokalemia Start Potassium chloride 41mEq twice daily x 3 days then 54mEq daily. Recheck cmp next lab draw  7. Acute blood loss anemia Most likely post op. Recent hgb/hct 9.8/30.1. Continue iron 150mg  daily and monitor h&h  8. GERD Continue Nexium 40mg  twice daily and monitor  9. Depression  Continue zoloft  200mg  daily and monitor for change in mood  Time spent: 45 minutes on care coordination   Labs ordered: cbc, cmp  Family/Staff Communication Plan of care discussed with patient and nursing staff. Patient and nursing staff verbalized understanding and agree with plan of care. No additional questions or concerns reported.    Arthur Holms, MSN, AGNP-C [facility] [address] [place], [place] [address] [phone] [8am-5pm] After hours: [phone]

## 2014-01-29 NOTE — Progress Notes (Signed)
Clinical Social Work Department CLINICAL SOCIAL WORK PLACEMENT NOTE 01/29/2014  Patient:  [name] [name], [name] [name]  Account Number:  [id number] Admit date:  01/27/2014  Clinical Social Worker:  Werner Lean, LCSW  Date/time:  01/28/2014 02:27 PM  Clinical Social Work is seeking post-discharge placement for this patient at the following level of care:   SKILLED NURSING   (*CSW will update this form in Epic as items are completed)     Patient/family provided with [facility] Department of Clinical Social Work's list of facilities offering this level of care within the geographic area requested by the patient (or if unable, by the patient's family).  01/28/2014  Patient/family informed of their freedom to choose among providers that offer the needed level of care, that participate in Medicare, Medicaid or managed care program needed by the patient, have an available bed and are willing to accept the patient.    Patient/family informed of [facility]' ownership interest in [facility], as well as of the fact that they are under no obligation to receive care at this facility.  PASARR submitted to EDS on 01/28/2014 PASARR number received on 01/28/2014  FL2 transmitted to all facilities in geographic area requested by pt/family on  01/28/2014 FL2 transmitted to all facilities within larger geographic area on   Patient informed that his/her managed care company has contracts with or will negotiate with  certain facilities, including the following:     Patient/family informed of bed offers received:  01/28/2014 Patient chooses bed at [facility] Physician recommends and patient chooses bed at    Patient to be transferred to [facility] on  01/29/2014 Patient to be transferred to facility by [facility] Patient and family notified of transfer on 01/29/2014 Name of family member notified:  Pt declined assistance.  The following physician request were entered in Epic:   Additional  Comments: Pt in agreement with d/c to SNF today. PT approved transport by car. NSG reviewed d/c summary, scripts, avs. Scripots included in d/c packet. D/c packet provided to pt.  Werner Lean LCSW [phone]

## 2014-01-30 ENCOUNTER — Encounter: Payer: Self-pay | Admitting: Registered Nurse

## 2014-01-30 ENCOUNTER — Non-Acute Institutional Stay (SKILLED_NURSING_FACILITY): Payer: Medicare Other | Admitting: Internal Medicine

## 2014-01-30 DIAGNOSIS — M1712 Unilateral primary osteoarthritis, left knee: Secondary | ICD-10-CM | POA: Diagnosis not present

## 2014-01-30 DIAGNOSIS — E876 Hypokalemia: Secondary | ICD-10-CM | POA: Diagnosis not present

## 2014-01-30 DIAGNOSIS — E785 Hyperlipidemia, unspecified: Secondary | ICD-10-CM | POA: Diagnosis not present

## 2014-01-30 DIAGNOSIS — D62 Acute posthemorrhagic anemia: Secondary | ICD-10-CM | POA: Diagnosis not present

## 2014-01-30 DIAGNOSIS — F419 Anxiety disorder, unspecified: Secondary | ICD-10-CM

## 2014-01-30 DIAGNOSIS — K5901 Slow transit constipation: Secondary | ICD-10-CM | POA: Diagnosis not present

## 2014-01-30 NOTE — Progress Notes (Signed)
Patient ID: [name] [name], female   DOB: [birth year], 80 y.o.   MRN: [id number]     Facility: [facility]    PCP: Loura Pardon, MD   Allergies  Allergen Reactions  . Alendronate Sodium     REACTION: JAW PAIN  . Celecoxib     REACTION: GI UPSET  . Latex     Chief Complaint  Patient presents with  . New Admit To SNF     HPI:  80 year old patient is here for short term rehabilitation post hospital admission from 01/27/14 to 01/29/14 with left knee OA and she underwent left total knee replacement. She is seen in room today. She has not had a bowel movement since Sunday. Her pain is under control with current medication. She has been working with therapy team. She denies any other concerns. She was supposed to get a suppository last night but refused it. Has been passing flatus and feels she will have a bowel movement today. She has PMH of depression, GERD, HLD among others.  Review of Systems:  Constitutional: Negative for fever, chills, malaise/fatigue and diaphoresis.  HENT: Negative for headache, congestion Respiratory: Negative for cough, shortness of breath and wheezing.   Cardiovascular: Negative for chest pain, palpitations, leg swelling.  Gastrointestinal: Negative for heartburn, nausea, vomiting, abdominal pain Genitourinary: Negative for dysuria  Musculoskeletal: Negative for back pain, falls Skin: Negative for itching, rash.  Neurological: Negative for dizziness, tingling, focal weakness Psychiatric/Behavioral: Negative for depression   Past Medical History  Diagnosis Date  . Painful respiration   . Personal history of unspecified circulatory disease   . Palpitations   . Other and unspecified hyperlipidemia   . Esophageal reflux   . Depressive disorder, not elsewhere classified   . Anxiety state, unspecified   . Unspecified arthropathy, multiple sites   . Diaphragmatic hernia without mention of obstruction or gangrene   . External  hemorrhoids without mention of complication   . Diverticulosis of colon (without mention of hemorrhage)   . Diarrhea   . Special screening for malignant neoplasms of other sites   . Unspecified vitamin D deficiency   . Other malaise and fatigue 08-11-11    hx. "chronic fatigue syndrome"  . Disorder of bone and cartilage, unspecified   . Helicobacter pylori (H. pylori)   . Macular degeneration (senile) of retina, unspecified   . Shortness of breath 08-11-11    with exertion only at present  . Unspecified cerebral artery occlusion with cerebral infarction 08-11-11    '97/ '06( TIA)-affected lt. side, no residual  . Gallstones 08-11-11    asymptomatic at present  . Personal history of other diseases of digestive system 08-11-11    "Irittable bowel syndrome"  . Urinary incontinence 08-11-11    wears peripad daily  . Osteoarthrosis, unspecified whether generalized or localized, unspecified site 08-11-11    hx. "rhematoid arthritis", osteoarthritis, DDD, bursitis(hip)  . Lumbar spondylosis   . Complication of anesthesia     slow to wake up with surgery several years ago   . PONV (postoperative nausea and vomiting)    Past Surgical History  Procedure Laterality Date  . Appendectomy    . Tubal ligation    . Knee surgery  08-11-11    rt. knee scope  . Cataract extraction  08-11-11    Bilateral  . Back surgery  08-11-11    '11-hx. lumbar fusion with retained hardware  . Abdominal hysterectomy  08-11-11  . Total knee arthroplasty  08/16/2011    Procedure: TOTAL KNEE ARTHROPLASTY;  Surgeon: Gearlean Alf, MD;  Location: [facility];  Service: Orthopedics;  Laterality: Right;  . Total knee arthroplasty Left 01/27/2014    Procedure: LEFT TOTAL KNEE ARTHROPLASTY;  Surgeon: Gearlean Alf, MD;  Location: [facility];  Service: Orthopedics;  Laterality: Left;   Social History:   reports that she has never smoked. She has never used smokeless tobacco. She reports that she does not drink alcohol or use illicit  drugs.  Family History  Problem Relation Age of Onset  . Heart disease Father   . Diabetes Sister   . Coronary artery disease Brother   . Diabetes Brother   . Heart disease Brother   . Pancreatic cancer Daughter   . Colon cancer Neg Hx     Medications: Patient's Medications  New Prescriptions   No medications on file  Previous Medications   ACETAMINOPHEN (TYLENOL) 325 MG TABLET    Take 2 tablets (650 mg total) by mouth every 6 (six) hours as needed for mild pain (or Fever >/= 101).   ALPRAZOLAM (XANAX) 0.5 MG TABLET    Take 1 tablet (0.5 mg total) by mouth at bedtime as needed for sleep.   ATORVASTATIN (LIPITOR) 10 MG TABLET    TAKE 1 TABLET DAILY   BISACODYL (DULCOLAX) 10 MG SUPPOSITORY    Place 1 suppository (10 mg total) rectally daily as needed for moderate constipation.   DOCUSATE SODIUM 100 MG CAPS    Take 100 mg by mouth 2 (two) times daily.   IRON POLYSACCHARIDES (NIFEREX) 150 MG CAPSULE    Take 1 capsule (150 mg total) by mouth daily.   METHOCARBAMOL (ROBAXIN) 500 MG TABLET    Take 1 tablet (500 mg total) by mouth every 6 (six) hours as needed for muscle spasms.   METOCLOPRAMIDE (REGLAN) 5 MG TABLET    Take 1 tablet (5 mg total) by mouth every 8 (eight) hours as needed for nausea (if ondansetron (ZOFRAN) ineffective.).   OXYCODONE (OXY IR/ROXICODONE) 5 MG IMMEDIATE RELEASE TABLET    Take 1-2 tablets (5-10 mg total) by mouth every 3 (three) hours as needed for moderate pain, severe pain or breakthrough pain.   POLYETHYLENE GLYCOL (MIRALAX / GLYCOLAX) PACKET    Take 17 g by mouth daily.   RIVAROXABAN (XARELTO) 10 MG TABS TABLET    Take 1 tablet (10 mg total) by mouth daily with breakfast. Take Xarelto for two and a half more weeks, then discontinue Xarelto. Once the patient has completed the Xarelto, they may resume the Plavix 75 mg daily.  Modified Medications   No medications on file  Discontinued Medications   No medications on file     Physical Exam: Filed Vitals:    01/30/14 1749  BP: 134/70  Pulse: 78  Temp: 97.2 F (36.2 C)  Resp: 18  SpO2: 97%    General- elderly female, well built, in no acute distress Head- normocephalic, atraumatic Neck- no cervical lymphadenopathy Cardiovascular- normal s1,s2, no murmurs Respiratory- bilateral clear to auscultation, no wheeze, no rhonchi, no crackles, no use of accessory muscles Abdomen- bowel sounds present, soft, non tender, no guarding or rigidity Musculoskeletal- able to move all 4 extremities, left knee ROM limited, left leg trace edema Skin- warm and dry, incision on left knee healing well, steristrips in place Psychiatry- alert and oriented to person, place and time, normal mood and affect    Labs reviewed: Basic Metabolic Panel:  Recent Labs  01/20/14 1330 01/28/14  0420 01/29/14 0405  NA 139 140 139  K 4.8 4.1 3.2*  CL 106 105 107  CO2 27 29 26   GLUCOSE 97 123* 105*  BUN 16 11 9   CREATININE 1.14* 0.78 0.62  CALCIUM 9.3 8.5 8.4   Liver Function Tests:  Recent Labs  12/27/13 1205 01/20/14 1330  AST 22 29  ALT 18 17  ALKPHOS 62 77  BILITOT 0.5 0.6  PROT 7.1 7.6  ALBUMIN 4.2 4.7   No results for input(s): LIPASE, AMYLASE in the last 8760 hours. No results for input(s): AMMONIA in the last 8760 hours. CBC:  Recent Labs  12/27/13 1205 01/20/14 1330 01/28/14 0420 01/29/14 0405  WBC 5.4 6.2 8.7 8.0  NEUTROABS 3.0  --   --   --   HGB 13.9 13.5 10.5* 9.8*  HCT 42.8 43.0 32.6* 30.1*  MCV 88.3 91.3 91.3 89.6  PLT 277.0 293 223 188    Assessment/Plan  Left knee OA Status post left knee replacement. Will have her work with physical therapy and occupational therapy team to help with gait training and muscle strengthening exercises.fall precautions. Skin care. Encourage to be out of bed. Continue oxy IR 5mg  1-2 tablets every 3 hours as needed and tylenol 650mg  every six hours as needed for pain. Continue xarelto 10mg  daily for DVT prophylaxis. To follow up with orthopedics.  Continue robaxin 500mg  every six hours as needed for muscle spasms.   Constipation Continue colace 100mg  twice daily, miralax daily and dulcolax 10mg  suppository daily as needed. Pt advised to take her suppository tonight if she does not have a bowel movement by evening and pt agrees  Acute blood loss anemia Continue iron 150 mg daily and monitor h&h  Hypokalemia Continue kcl supplement, monitor bmp  Anxiety Continue xanax  0.5mg  daily at bedtime as needed for sleep.   Hyperlipidemia Continue lipitor 10mg  daily and monitor   Goals of care: short term rehabilitation   Labs/tests ordered: cbc, bmp  Family/ staff Communication: reviewed care plan with patient and nursing supervisor    Blanchie Serve, MD  [facility] [phone] (Monday-Friday 8 am - 5 pm) [phone] (afterhours)

## 2014-01-30 NOTE — Progress Notes (Signed)
This encounter was created in error - please disregard.

## 2014-01-30 NOTE — Addendum Note (Signed)
Addended by: Arthur Holms T on: 01/30/2014 09:11 PM   Modules accepted: Medications

## 2014-01-31 LAB — CBC AND DIFFERENTIAL
HEMATOCRIT: 32 % — AB (ref 36–46)
Hemoglobin: 10.6 g/dL — AB (ref 12.0–16.0)
PLATELETS: 270 10*3/uL (ref 150–399)
WBC: 6.7 10^3/mL

## 2014-01-31 LAB — BASIC METABOLIC PANEL
BUN: 9 mg/dL (ref 4–21)
CREATININE: 0.6 mg/dL (ref 0.5–1.1)
Glucose: 119 mg/dL
Potassium: 4.4 mmol/L (ref 3.4–5.3)
SODIUM: 134 mmol/L — AB (ref 137–147)

## 2014-02-03 ENCOUNTER — Other Ambulatory Visit: Payer: Self-pay | Admitting: *Deleted

## 2014-02-03 MED ORDER — OXYCODONE HCL 5 MG PO TABS: ORAL_TABLET | ORAL | Status: DC

## 2014-02-03 NOTE — Telephone Encounter (Signed)
[facility] 

## 2014-02-04 ENCOUNTER — Non-Acute Institutional Stay (SKILLED_NURSING_FACILITY): Payer: Medicare Other | Admitting: Registered Nurse

## 2014-02-04 DIAGNOSIS — E876 Hypokalemia: Secondary | ICD-10-CM | POA: Diagnosis not present

## 2014-02-04 DIAGNOSIS — F329 Major depressive disorder, single episode, unspecified: Secondary | ICD-10-CM

## 2014-02-04 DIAGNOSIS — K219 Gastro-esophageal reflux disease without esophagitis: Secondary | ICD-10-CM | POA: Diagnosis not present

## 2014-02-04 DIAGNOSIS — F419 Anxiety disorder, unspecified: Secondary | ICD-10-CM

## 2014-02-04 DIAGNOSIS — F32A Depression, unspecified: Secondary | ICD-10-CM

## 2014-02-04 DIAGNOSIS — D62 Acute posthemorrhagic anemia: Secondary | ICD-10-CM | POA: Diagnosis not present

## 2014-02-04 DIAGNOSIS — F5105 Insomnia due to other mental disorder: Secondary | ICD-10-CM

## 2014-02-04 DIAGNOSIS — M1712 Unilateral primary osteoarthritis, left knee: Secondary | ICD-10-CM

## 2014-02-04 DIAGNOSIS — K5909 Other constipation: Secondary | ICD-10-CM

## 2014-02-04 DIAGNOSIS — Z471 Aftercare following joint replacement surgery: Secondary | ICD-10-CM | POA: Diagnosis not present

## 2014-02-04 DIAGNOSIS — K5903 Drug induced constipation: Secondary | ICD-10-CM

## 2014-02-04 DIAGNOSIS — E785 Hyperlipidemia, unspecified: Secondary | ICD-10-CM | POA: Diagnosis not present

## 2014-02-04 DIAGNOSIS — Z96652 Presence of left artificial knee joint: Secondary | ICD-10-CM | POA: Diagnosis not present

## 2014-02-05 ENCOUNTER — Encounter: Payer: Self-pay | Admitting: Registered Nurse

## 2014-02-05 NOTE — Progress Notes (Signed)
Patient ID: [name] [name], female   DOB: Sep 28, 1934, 80 y.o.   MRN: [id number]   Place of Service: [facility]  Allergies  Allergen Reactions  . Alendronate Sodium     REACTION: JAW PAIN  . Celecoxib     REACTION: GI UPSET  . Latex     Code Status: Full Code  Goals of Care: Longevity/STR  Chief Complaint  Patient presents with  . Discharge Note    HPI 80 y.o. female with PMH of OA, depression, GERD, HLD, anxiety, insomnia among many others is being seen for discharge visit. She was here for short-term rehab post hospital admission from 01/27/14 to 01/29/14 for left total knee replacement. She has worked well with therapy team and is ready to be discharged home with [facility] PT/OT/RN and no DME. Seen in room today. No complaints verbalized by patient. Pain is adequately controlled with current regimen. Constipation resolved with regular use of  colace and miralax.   Review of Systems Constitutional: Negative for fever, chills, and fatigue. HENT: Negative for ear pain, congestion, and sore throat Eyes: Negative for eye pain, eye discharge, and visual disturbance  Cardiovascular: Negative for chest pain, palpitations, and leg swelling Respiratory: Negative cough, shortness of breath, and wheezing.  Gastrointestinal: Negative for nausea and vomiting. Negative for abdominal pain. Negative for diarrhea and constipation Genitourinary: Negative for  dysuria and hematuria Endocrine: Negative for polydipsia, polyphagia, and polyuria Musculoskeletal: Positive for back spasms (ongoing). Positive for post op left knee pain Neurological: Negative for dizziness and headache Skin: Negative for rash and pruritus.   Psychiatric: Positive for anxiety and depression. Negative for suicidal ideation.    Past Medical History  Diagnosis Date  . Painful respiration   . Personal history of unspecified circulatory disease   . Palpitations   . Other and unspecified hyperlipidemia   .  Esophageal reflux   . Depressive disorder, not elsewhere classified   . Anxiety state, unspecified   . Unspecified arthropathy, multiple sites   . Diaphragmatic hernia without mention of obstruction or gangrene   . External hemorrhoids without mention of complication   . Diverticulosis of colon (without mention of hemorrhage)   . Diarrhea   . Special screening for malignant neoplasms of other sites   . Unspecified vitamin D deficiency   . Other malaise and fatigue 08-11-11    hx. "chronic fatigue syndrome"  . Disorder of bone and cartilage, unspecified   . Helicobacter pylori (H. pylori)   . Macular degeneration (senile) of retina, unspecified   . Shortness of breath 08-11-11    with exertion only at present  . Unspecified cerebral artery occlusion with cerebral infarction 08-11-11    '97/ '06( TIA)-affected lt. side, no residual  . Gallstones 08-11-11    asymptomatic at present  . Personal history of other diseases of digestive system 08-11-11    "Irittable bowel syndrome"  . Urinary incontinence 08-11-11    wears peripad daily  . Osteoarthrosis, unspecified whether generalized or localized, unspecified site 08-11-11    hx. "rhematoid arthritis", osteoarthritis, DDD, bursitis(hip)  . Lumbar spondylosis   . Complication of anesthesia     slow to wake up with surgery several years ago   . PONV (postoperative nausea and vomiting)     Past Surgical History  Procedure Laterality Date  . Appendectomy    . Tubal ligation    . Knee surgery  08-11-11    rt. knee scope  . Cataract extraction  08-11-11  Bilateral  . Back surgery  08-11-11    '11-hx. lumbar fusion with retained hardware  . Abdominal hysterectomy  08-11-11  . Total knee arthroplasty  08/16/2011    Procedure: TOTAL KNEE ARTHROPLASTY;  Surgeon: Gearlean Alf, MD;  Location: [facility];  Service: Orthopedics;  Laterality: Right;  . Total knee arthroplasty Left 01/27/2014    Procedure: LEFT TOTAL KNEE ARTHROPLASTY;  Surgeon: Gearlean Alf, MD;  Location: [facility];  Service: Orthopedics;  Laterality: Left;    History  Substance Use Topics  . Smoking status: Never Smoker   . Smokeless tobacco: Never Used  . Alcohol Use: No    Family History  Problem Relation Age of Onset  . Heart disease Father   . Diabetes Sister   . Coronary artery disease Brother   . Diabetes Brother   . Heart disease Brother   . Pancreatic cancer Daughter   . Colon cancer Neg Hx       Medication List       This list is accurate as of: 02/04/14 11:59 PM.  Always use your most recent med list.               acetaminophen 325 MG tablet  Commonly known as:  TYLENOL  Take 2 tablets (650 mg total) by mouth every 6 (six) hours as needed for mild pain (or Fever >/= 101).     ALPRAZolam 0.5 MG tablet  Commonly known as:  XANAX  Take 1 tablet (0.5 mg total) by mouth at bedtime as needed for sleep.     atorvastatin 10 MG tablet  Commonly known as:  LIPITOR  TAKE 1 TABLET DAILY     bisacodyl 10 MG suppository  Commonly known as:  DULCOLAX  Place 1 suppository (10 mg total) rectally daily as needed for moderate constipation.     DSS 100 MG Caps  Take 100 mg by mouth 2 (two) times daily.     esomeprazole 40 MG capsule  Commonly known as:  NEXIUM  Take 40 mg by mouth 2 (two) times daily before a meal.     iron polysaccharides 150 MG capsule  Commonly known as:  NIFEREX  Take 1 capsule (150 mg total) by mouth daily.     methocarbamol 500 MG tablet  Commonly known as:  ROBAXIN  Take 1 tablet (500 mg total) by mouth every 6 (six) hours as needed for muscle spasms.     oxyCODONE 5 MG immediate release tablet  Commonly known as:  Oxy IR/ROXICODONE  Take one tablet by mouth every 3 hours as needed for moderate pain; Take two tablets by mouth every 3 hours as needed for severe pain     polyethylene glycol packet  Commonly known as:  MIRALAX / GLYCOLAX  Take 17 g by mouth daily.     potassium chloride 20 MEQ packet  Commonly  known as:  KLOR-CON  Take 40 mEq by mouth daily.     rivaroxaban 10 MG Tabs tablet  Commonly known as:  XARELTO  - Take 1 tablet (10 mg total) by mouth daily with breakfast. Take Xarelto for two and a half more weeks, then discontinue Xarelto.  - Once the patient has completed the Xarelto, they may resume the Plavix 75 mg daily.     sertraline 100 MG tablet  Commonly known as:  ZOLOFT  Take 100 mg by mouth 2 (two) times daily.        Physical Exam  BP 117/68 mmHg  Pulse 94  Temp(Src) 97.2 F (36.2 C)  Resp 20  Ht 5\' 5"  (1.651 m)  Wt 215 lb (97.523 kg)  BMI 35.78 kg/m2  Constitutional: WDWN elderly female in no acute distress. Conversant.  HEENT: Normocephalic and atraumatic. PERRL. EOM intact. No icterus.  No nasal discharge or sinus tenderness. Oral mucosa dry. Posterior pharynx clear of any exudate or lesions.  Neck: Supple and nontender. No lymphadenopathy, masses, or thyromegaly. No JVD or carotid bruits. Cardiac: Normal S1, S2. RRR without appreciable murmurs, rubs, or gallops. Distal pulses intact. Trace dependent edema.  Lungs: No respiratory distress. Breath sounds clear bilaterally without rales, rhonchi, or wheezes. Abdomen: Audible bowel sounds in all quadrants. Soft, nontender, nondistended. No palpable mass.  Musculoskeletal: able to move all extremities. Limited ROM of LLE. Skin: Warm and dry. No rash noted. Left knee surgical wound w/o signs of infection Neurological: Alert and oriented to person, place, and time. No focal deficits.  Psychiatric: Mildly anxious.  Labs Reviewed  CBC Latest Ref Rng 01/31/2014 01/29/2014 01/28/2014  WBC - 6.7 8.0 8.7  Hemoglobin 12.0 - 16.0 g/dL 10.6(A) 9.8(L) 10.5(L)  Hematocrit 36 - 46 % 32(A) 30.1(L) 32.6(L)  Platelets 150 - 399 K/L 270 188 223    CMP Latest Ref Rng 01/31/2014 01/29/2014 01/28/2014  Glucose 70 - 99 mg/dL - 105(H) 123(H)  BUN 4 - 21 mg/dL 9 9 11   Creatinine 0.5 - 1.1 mg/dL 0.6 0.62 0.78  Sodium 137 - 147  mmol/L 134(A) 139 140  Potassium 3.4 - 5.3 mmol/L 4.4 3.2(L) 4.1  Chloride 96 - 112 mEq/L - 107 105  CO2 19 - 32 mmol/L - 26 29  Calcium 8.4 - 10.5 mg/dL - 8.4 8.5  Total Protein 6.0 - 8.3 g/dL - - -  Total Bilirubin 0.3 - 1.2 mg/dL - - -  Alkaline Phos 39 - 117 U/L - - -  AST 0 - 37 U/L - - -  ALT 0 - 35 U/L - - -     Lab Results  Component Value Date   TSH 2.46 12/27/2013    Lipid Panel     Component Value Date/Time   CHOL 179 12/27/2013 1205   TRIG 100.0 12/27/2013 1205   HDL 79.60 12/27/2013 1205   CHOLHDL 2 12/27/2013 1205   VLDL 20.0 12/27/2013 1205   LDLCALC 79 12/27/2013 1205    Assessment & Plan 1. Left Knee OA S/p Left total knee replacement. Continue to work with [facility] PT/OT for gait/strength/balance training to restore/maximize function. Pain is adequately controlled with current regimen. Continue oxy IR 5mg  1-2 tablets every 3 hours as needed and tylenol 650mg  every six hours as needed for pain. Continue robaxin 500mg  every six hours as needed for muscle spasms. Continue xarelto 10mg  daily until 02/16/14 for DVT propylaxis. Resume plavix 75mg  daily on 02/17/14 after stopping xarelto. Continue to f/u with ortho  2. Constipation Improves. Continue colace 100mg  twice daily and miralax 17g daily. Encourage hydration and activities as tolerated.   3. Insomnia secondary to anxiety Continue xanax 0.5mg  daily at bedtime as needed for sleep.   4. HLD (hyperlipidemia) LDL 79. Continue lipitor 10mg  daily  5. Hypokalemia Resolved. Most recent K 4.4. Continue potassium 40mEq daily for now. PCP to monitor potassium level and discontinue supplement as necessary.   6. Acute blood loss anemia Stable. Recent hgb/hct 10.6/32.5. Continue iron 150mg  daily. PCP to monitor h&h  7. GERD No issues. Continue Nexium 40mg  twice daily for now. Dose reduction recommended.  8. Depression Mood stable. Continue zoloft 100mg  twice daily.  Home health services: PT/OT/RN DME required:  None PCP follow-up: Dr. Loura Pardon on 02/17/14 @ 12pm 30-day supply of prescription medications provided (#30 xanax 0.5mg , #60 robaxin 500mg , and #60 of oxycodone IR 5mg )   Time spent: 45 minutes on care coordination and counseling  Family/Staff Communication Plan of care discussed with patient and nursing staff. Patient and nursing staff verbalized understanding and agree with plan of care. No additional questions or concerns reported.    Arthur Holms, MSN, AGNP-C [facility] [address] [place], [place] [address] [phone] [8am-5pm] After hours: [phone]

## 2014-02-09 DIAGNOSIS — Z9181 History of falling: Secondary | ICD-10-CM | POA: Diagnosis not present

## 2014-02-09 DIAGNOSIS — Z96653 Presence of artificial knee joint, bilateral: Secondary | ICD-10-CM | POA: Diagnosis not present

## 2014-02-09 DIAGNOSIS — R269 Unspecified abnormalities of gait and mobility: Secondary | ICD-10-CM | POA: Diagnosis not present

## 2014-02-09 DIAGNOSIS — Z471 Aftercare following joint replacement surgery: Secondary | ICD-10-CM | POA: Diagnosis not present

## 2014-02-11 DIAGNOSIS — R269 Unspecified abnormalities of gait and mobility: Secondary | ICD-10-CM | POA: Diagnosis not present

## 2014-02-11 DIAGNOSIS — Z471 Aftercare following joint replacement surgery: Secondary | ICD-10-CM | POA: Diagnosis not present

## 2014-02-11 DIAGNOSIS — Z96653 Presence of artificial knee joint, bilateral: Secondary | ICD-10-CM | POA: Diagnosis not present

## 2014-02-11 DIAGNOSIS — Z9181 History of falling: Secondary | ICD-10-CM | POA: Diagnosis not present

## 2014-02-13 DIAGNOSIS — Z9181 History of falling: Secondary | ICD-10-CM | POA: Diagnosis not present

## 2014-02-13 DIAGNOSIS — Z471 Aftercare following joint replacement surgery: Secondary | ICD-10-CM | POA: Diagnosis not present

## 2014-02-13 DIAGNOSIS — R269 Unspecified abnormalities of gait and mobility: Secondary | ICD-10-CM | POA: Diagnosis not present

## 2014-02-13 DIAGNOSIS — Z96653 Presence of artificial knee joint, bilateral: Secondary | ICD-10-CM | POA: Diagnosis not present

## 2014-02-17 ENCOUNTER — Encounter: Payer: Self-pay | Admitting: Family Medicine

## 2014-02-17 ENCOUNTER — Ambulatory Visit (INDEPENDENT_AMBULATORY_CARE_PROVIDER_SITE_OTHER): Payer: Medicare Other | Admitting: Family Medicine

## 2014-02-17 VITALS — BP 118/78 | HR 91 | Temp 98.0°F | Ht 65.0 in | Wt 210.0 lb

## 2014-02-17 DIAGNOSIS — E876 Hypokalemia: Secondary | ICD-10-CM | POA: Diagnosis not present

## 2014-02-17 DIAGNOSIS — F411 Generalized anxiety disorder: Secondary | ICD-10-CM

## 2014-02-17 DIAGNOSIS — D62 Acute posthemorrhagic anemia: Secondary | ICD-10-CM | POA: Insufficient documentation

## 2014-02-17 DIAGNOSIS — K219 Gastro-esophageal reflux disease without esophagitis: Secondary | ICD-10-CM | POA: Diagnosis not present

## 2014-02-17 LAB — CBC WITH DIFFERENTIAL/PLATELET
Basophils Absolute: 0 10*3/uL (ref 0.0–0.1)
Basophils Relative: 0.8 % (ref 0.0–3.0)
EOS ABS: 0.1 10*3/uL (ref 0.0–0.7)
EOS PCT: 1.9 % (ref 0.0–5.0)
HEMATOCRIT: 35.8 % — AB (ref 36.0–46.0)
HEMOGLOBIN: 11.8 g/dL — AB (ref 12.0–15.0)
LYMPHS ABS: 1.9 10*3/uL (ref 0.7–4.0)
Lymphocytes Relative: 32.5 % (ref 12.0–46.0)
MCHC: 33 g/dL (ref 30.0–36.0)
MCV: 84.2 fl (ref 78.0–100.0)
Monocytes Absolute: 0.6 10*3/uL (ref 0.1–1.0)
Monocytes Relative: 10.3 % (ref 3.0–12.0)
NEUTROS PCT: 54.5 % (ref 43.0–77.0)
Neutro Abs: 3.1 10*3/uL (ref 1.4–7.7)
Platelets: 537 10*3/uL — ABNORMAL HIGH (ref 150.0–400.0)
RBC: 4.25 Mil/uL (ref 3.87–5.11)
RDW: 14.5 % (ref 11.5–15.5)
WBC: 5.7 10*3/uL (ref 4.0–10.5)

## 2014-02-17 LAB — POTASSIUM: Potassium: 4.3 mEq/L (ref 3.5–5.1)

## 2014-02-17 NOTE — Assessment & Plan Note (Signed)
S/p total knee replacement  Check cbc She has stopped iron but will re start it if necessary   Still fatigued

## 2014-02-17 NOTE — Assessment & Plan Note (Signed)
Pt started on K in hospital and she is still taking it  Level today  Then will adv re: dosing

## 2014-02-17 NOTE — Progress Notes (Signed)
Pre visit review using our clinic review tool, if applicable. No additional management support is needed unless otherwise documented below in the visit note. 

## 2014-02-17 NOTE — Assessment & Plan Note (Signed)
Worse after missing doses of nexium in the hospital  Now feels a lump in throat when swallowing Expect improvement back on nexium - will udpate if no further improvement in 1-2 weeks  Disc diet for gerd as well

## 2014-02-17 NOTE — Assessment & Plan Note (Signed)
Pt is still needing xanax at night for sleep  Disc risks of falls/sedation  Expect this to improve now that she is home from rehab

## 2014-02-17 NOTE — Patient Instructions (Signed)
Cbc today - will tell you to re start iron if needed Potassium level today - we will advise you about potassium   Take care of yourself and continue your physical therapy  I'm glad you are doing well

## 2014-02-17 NOTE — Progress Notes (Signed)
Subjective:    Patient ID: [name] [name], female    DOB: [birth year], 80 y.o.   MRN: [id number]  HPI Here for f/u after L TKR  hosp 1/11 and 1/13  Rehab at [facility] - PT and OT  Will have more PT at home - 3 more sessions  Walking with a cane or a walker   Colace and miralax for constipation  Still on some pain maed oxy IR / tylenol / robaxin for muscle spasm   xarelto until 1/31 for DVT prophylaxis , now back on plavix  Will start back on evista   Did not get her nexium in the hospital - and had a lot of reflux symptoms / now feels a lump in her throat and it hurts to swallow   Wt is down 5 lb   Stopped her iron 150 mg after leaving her rehab  Lab Results  Component Value Date   WBC 6.7 01/31/2014   HGB 10.6* 01/31/2014   HCT 32* 01/31/2014   MCV 89.6 01/29/2014   PLT 270 01/31/2014    Was on K as well -unsure if she needed it -has not taken over the past week   Glad to have the worst behind her   Xanax - takes at night for sleep   Sees orthopedics 2/8   Patient Active Problem List   Diagnosis Date Noted  . Pre-operative examination 12/27/2013  . Dysphagia 10/17/2011  . OA (osteoarthritis) of knee 08/16/2011  . Hemorrhoids 08/10/2011  . Other dyspnea and respiratory abnormality 06/21/2010  . Urinary incontinence 05/27/2010  . OVERACTIVE BLADDER 03/12/2010  . DEGENERATIVE DISC DISEASE 04/13/2009  . GERD 10/15/2008  . CEREBROVASCULAR ACCIDENT, HX OF 10/15/2008  . ARTHRITIS, GENERALIZED 06/26/2008  . EXTERNAL HEMORRHOIDS 02/01/2008  . HIATAL HERNIA 02/01/2008  . DIVERTICULOSIS OF COLON 02/01/2008  . GASTRITIS, HX OF 02/01/2008  . Vitamin D deficiency 07/03/2007  . Osteopenia 06/13/2007  . Fatigue 06/13/2007  . HELICOBACTER PYLORI INFECTION 06/06/2006  . Hyperlipidemia 06/06/2006  . ANXIETY 06/06/2006  . DEPRESSION 06/06/2006  . MACULAR DEGENERATION 06/06/2006  . CVA 06/06/2006  . OSTEOARTHRITIS 06/06/2006  . PALPITATIONS 05/25/2006   Past  Medical History  Diagnosis Date  . Painful respiration   . Personal history of unspecified circulatory disease   . Palpitations   . Other and unspecified hyperlipidemia   . Esophageal reflux   . Depressive disorder, not elsewhere classified   . Anxiety state, unspecified   . Unspecified arthropathy, multiple sites   . Diaphragmatic hernia without mention of obstruction or gangrene   . External hemorrhoids without mention of complication   . Diverticulosis of colon (without mention of hemorrhage)   . Diarrhea   . Special screening for malignant neoplasms of other sites   . Unspecified vitamin D deficiency   . Other malaise and fatigue 08-11-11    hx. "chronic fatigue syndrome"  . Disorder of bone and cartilage, unspecified   . Helicobacter pylori (H. pylori)   . Macular degeneration (senile) of retina, unspecified   . Shortness of breath 08-11-11    with exertion only at present  . Unspecified cerebral artery occlusion with cerebral infarction 08-11-11    '97/ '06( TIA)-affected lt. side, no residual  . Gallstones 08-11-11    asymptomatic at present  . Personal history of other diseases of digestive system 08-11-11    "Irittable bowel syndrome"  . Urinary incontinence 08-11-11    wears peripad daily  . Osteoarthrosis, unspecified whether generalized or  localized, unspecified site 08-11-11    hx. "rhematoid arthritis", osteoarthritis, DDD, bursitis(hip)  . Lumbar spondylosis   . Complication of anesthesia     slow to wake up with surgery several years ago   . PONV (postoperative nausea and vomiting)    Past Surgical History  Procedure Laterality Date  . Appendectomy    . Tubal ligation    . Knee surgery  08-11-11    rt. knee scope  . Cataract extraction  08-11-11    Bilateral  . Back surgery  08-11-11    '11-hx. lumbar fusion with retained hardware  . Abdominal hysterectomy  08-11-11  . Total knee arthroplasty  08/16/2011    Procedure: TOTAL KNEE ARTHROPLASTY;  Surgeon: Gearlean Alf, MD;  Location: [facility];  Service: Orthopedics;  Laterality: Right;  . Total knee arthroplasty Left 01/27/2014    Procedure: LEFT TOTAL KNEE ARTHROPLASTY;  Surgeon: Gearlean Alf, MD;  Location: [facility];  Service: Orthopedics;  Laterality: Left;   History  Substance Use Topics  . Smoking status: Never Smoker   . Smokeless tobacco: Never Used  . Alcohol Use: No   Family History  Problem Relation Age of Onset  . Heart disease Father   . Diabetes Sister   . Coronary artery disease Brother   . Diabetes Brother   . Heart disease Brother   . Pancreatic cancer Daughter   . Colon cancer Neg Hx    Allergies  Allergen Reactions  . Alendronate Sodium     REACTION: JAW PAIN  . Celecoxib     REACTION: GI UPSET  . Latex    Current Outpatient Prescriptions on File Prior to Visit  Medication Sig Dispense Refill  . acetaminophen (TYLENOL) 325 MG tablet Take 2 tablets (650 mg total) by mouth every 6 (six) hours as needed for mild pain (or Fever >/= 101). 40 tablet 0  . ALPRAZolam (XANAX) 0.5 MG tablet Take 1 tablet (0.5 mg total) by mouth at bedtime as needed for sleep. 90 tablet 1  . atorvastatin (LIPITOR) 10 MG tablet TAKE 1 TABLET DAILY 90 tablet 0  . docusate sodium 100 MG CAPS Take 100 mg by mouth 2 (two) times daily. 10 capsule 0  . esomeprazole (NEXIUM) 40 MG capsule Take 40 mg by mouth 2 (two) times daily before a meal.    . methocarbamol (ROBAXIN) 500 MG tablet Take 1 tablet (500 mg total) by mouth every 6 (six) hours as needed for muscle spasms. 90 tablet 0  . oxyCODONE (OXY IR/ROXICODONE) 5 MG immediate release tablet Take one tablet by mouth every 3 hours as needed for moderate pain; Take two tablets by mouth every 3 hours as needed for severe pain 360 tablet 0  . potassium chloride (KLOR-CON) 20 MEQ packet Take 40 mEq by mouth daily.    . rivaroxaban (XARELTO) 10 MG TABS tablet Take 1 tablet (10 mg total) by mouth daily with breakfast. Take Xarelto for two and a half more  weeks, then discontinue Xarelto. Once the patient has completed the Xarelto, they may resume the Plavix 75 mg daily. 19 tablet 0  . sertraline (ZOLOFT) 100 MG tablet Take 100 mg by mouth 2 (two) times daily.      No current facility-administered medications on file prior to visit.        Review of Systems Review of Systems  Constitutional: Negative for fever, appetite change, fatigue and unexpected weight change.  Eyes: Negative for pain and visual disturbance.  Respiratory: Negative  for cough and shortness of breath.   Cardiovascular: Negative for cp or palpitations    Gastrointestinal: Negative for nausea, diarrhea and constipation.  Genitourinary: Negative for urgency and frequency.  Skin: Negative for pallor or rash   MSK pos for L knee pain that is improving  Neurological: Negative for weakness, light-headedness, numbness and headaches.  Hematological: Negative for adenopathy. Does not bruise/bleed easily.  Psychiatric/Behavioral: Negative for dysphoric mood. The patient is not nervous/anxious.         Objective:   Physical Exam  Constitutional: She appears well-developed and well-nourished. No distress.  obese and well appearing   Walking with a cane  HENT:  Head: Normocephalic and atraumatic.  Eyes: Conjunctivae and EOM are normal. Pupils are equal, round, and reactive to light. No scleral icterus.  Neck: Normal range of motion. Neck supple. No JVD present. Carotid bruit is not present. No thyromegaly present.  Cardiovascular: Normal rate, regular rhythm, normal heart sounds and intact distal pulses.  Exam reveals no gallop.   Pulmonary/Chest: Effort normal and breath sounds normal. No respiratory distress. She has no wheezes. She has no rales.  Abdominal: Soft. Bowel sounds are normal. She exhibits no distension and no mass. There is no tenderness.  Musculoskeletal: She exhibits no edema.  L knee incision is well healed   Lymphadenopathy:    She has no cervical  adenopathy.  Neurological: She is alert. She has normal reflexes. No cranial nerve deficit. She exhibits normal muscle tone. Coordination normal.  Skin: Skin is warm and dry. No rash noted. No erythema. No pallor.  Psychiatric: She has a normal mood and affect.          Assessment & Plan:   Problem List Items Addressed This Visit      Digestive   GERD - Primary    Worse after missing doses of nexium in the hospital  Now feels a lump in throat when swallowing Expect improvement back on nexium - will udpate if no further improvement in 1-2 weeks  Disc diet for gerd as well         Other   Acute blood loss anemia    S/p total knee replacement  Check cbc She has stopped iron but will re start it if necessary   Still fatigued       Relevant Orders   CBC with Differential/Platelet (Completed)   Generalized anxiety disorder    Pt is still needing xanax at night for sleep  Disc risks of falls/sedation  Expect this to improve now that she is home from rehab      Hypokalemia    Pt started on K in hospital and she is still taking it  Level today  Then will adv re: dosing      Relevant Orders   Potassium (Completed)

## 2014-02-18 ENCOUNTER — Encounter: Payer: Self-pay | Admitting: *Deleted

## 2014-02-18 DIAGNOSIS — R269 Unspecified abnormalities of gait and mobility: Secondary | ICD-10-CM | POA: Diagnosis not present

## 2014-02-18 DIAGNOSIS — Z9181 History of falling: Secondary | ICD-10-CM | POA: Diagnosis not present

## 2014-02-18 DIAGNOSIS — Z471 Aftercare following joint replacement surgery: Secondary | ICD-10-CM | POA: Diagnosis not present

## 2014-02-18 DIAGNOSIS — Z96653 Presence of artificial knee joint, bilateral: Secondary | ICD-10-CM | POA: Diagnosis not present

## 2014-02-19 DIAGNOSIS — Z471 Aftercare following joint replacement surgery: Secondary | ICD-10-CM | POA: Diagnosis not present

## 2014-02-19 DIAGNOSIS — R269 Unspecified abnormalities of gait and mobility: Secondary | ICD-10-CM | POA: Diagnosis not present

## 2014-02-19 DIAGNOSIS — Z96653 Presence of artificial knee joint, bilateral: Secondary | ICD-10-CM | POA: Diagnosis not present

## 2014-02-19 DIAGNOSIS — Z9181 History of falling: Secondary | ICD-10-CM | POA: Diagnosis not present

## 2014-02-21 ENCOUNTER — Encounter (HOSPITAL_COMMUNITY): Payer: Self-pay | Admitting: Emergency Medicine

## 2014-02-21 ENCOUNTER — Emergency Department (HOSPITAL_COMMUNITY): Payer: Medicare Other

## 2014-02-21 ENCOUNTER — Inpatient Hospital Stay (HOSPITAL_COMMUNITY)
Admission: EM | Admit: 2014-02-21 | Discharge: 2014-02-25 | DRG: 501 | Disposition: A | Discharge status: Skilled Nursing Facility | Payer: Medicare Other | Attending: Orthopedic Surgery | Admitting: Orthopedic Surgery

## 2014-02-21 DIAGNOSIS — K219 Gastro-esophageal reflux disease without esophagitis: Secondary | ICD-10-CM | POA: Diagnosis not present

## 2014-02-21 DIAGNOSIS — Z7902 Long term (current) use of antithrombotics/antiplatelets: Secondary | ICD-10-CM

## 2014-02-21 DIAGNOSIS — Z8673 Personal history of transient ischemic attack (TIA), and cerebral infarction without residual deficits: Secondary | ICD-10-CM | POA: Diagnosis not present

## 2014-02-21 DIAGNOSIS — F339 Major depressive disorder, recurrent, unspecified: Secondary | ICD-10-CM | POA: Diagnosis not present

## 2014-02-21 DIAGNOSIS — M66862 Spontaneous rupture of other tendons, left lower leg: Secondary | ICD-10-CM | POA: Diagnosis not present

## 2014-02-21 DIAGNOSIS — Y93B9 Activity, other involving muscle strengthening exercises: Secondary | ICD-10-CM | POA: Diagnosis not present

## 2014-02-21 DIAGNOSIS — M545 Low back pain: Secondary | ICD-10-CM | POA: Diagnosis not present

## 2014-02-21 DIAGNOSIS — S82092A Other fracture of left patella, initial encounter for closed fracture: Secondary | ICD-10-CM | POA: Diagnosis not present

## 2014-02-21 DIAGNOSIS — M25462 Effusion, left knee: Secondary | ICD-10-CM | POA: Diagnosis present

## 2014-02-21 DIAGNOSIS — S86812A Strain of other muscle(s) and tendon(s) at lower leg level, left leg, initial encounter: Secondary | ICD-10-CM

## 2014-02-21 DIAGNOSIS — M25562 Pain in left knee: Secondary | ICD-10-CM | POA: Diagnosis not present

## 2014-02-21 DIAGNOSIS — Z471 Aftercare following joint replacement surgery: Secondary | ICD-10-CM | POA: Diagnosis not present

## 2014-02-21 DIAGNOSIS — S82002D Unspecified fracture of left patella, subsequent encounter for closed fracture with routine healing: Secondary | ICD-10-CM | POA: Diagnosis not present

## 2014-02-21 DIAGNOSIS — M66269 Spontaneous rupture of extensor tendons, unspecified lower leg: Secondary | ICD-10-CM | POA: Diagnosis not present

## 2014-02-21 DIAGNOSIS — R279 Unspecified lack of coordination: Secondary | ICD-10-CM | POA: Diagnosis not present

## 2014-02-21 DIAGNOSIS — T148XXA Other injury of unspecified body region, initial encounter: Secondary | ICD-10-CM

## 2014-02-21 DIAGNOSIS — F411 Generalized anxiety disorder: Secondary | ICD-10-CM | POA: Diagnosis present

## 2014-02-21 DIAGNOSIS — S59901A Unspecified injury of right elbow, initial encounter: Secondary | ICD-10-CM | POA: Diagnosis not present

## 2014-02-21 DIAGNOSIS — T148 Other injury of unspecified body region: Secondary | ICD-10-CM | POA: Diagnosis not present

## 2014-02-21 DIAGNOSIS — M47896 Other spondylosis, lumbar region: Secondary | ICD-10-CM | POA: Diagnosis not present

## 2014-02-21 DIAGNOSIS — S76112A Strain of left quadriceps muscle, fascia and tendon, initial encounter: Secondary | ICD-10-CM | POA: Diagnosis not present

## 2014-02-21 DIAGNOSIS — Y92009 Unspecified place in unspecified non-institutional (private) residence as the place of occurrence of the external cause: Secondary | ICD-10-CM | POA: Diagnosis not present

## 2014-02-21 DIAGNOSIS — S82009A Unspecified fracture of unspecified patella, initial encounter for closed fracture: Secondary | ICD-10-CM | POA: Diagnosis present

## 2014-02-21 DIAGNOSIS — Z96653 Presence of artificial knee joint, bilateral: Secondary | ICD-10-CM | POA: Diagnosis not present

## 2014-02-21 DIAGNOSIS — S3993XA Unspecified injury of pelvis, initial encounter: Secondary | ICD-10-CM | POA: Diagnosis not present

## 2014-02-21 DIAGNOSIS — Z79899 Other long term (current) drug therapy: Secondary | ICD-10-CM | POA: Diagnosis not present

## 2014-02-21 DIAGNOSIS — Z96652 Presence of left artificial knee joint: Secondary | ICD-10-CM | POA: Diagnosis present

## 2014-02-21 DIAGNOSIS — W19XXXA Unspecified fall, initial encounter: Secondary | ICD-10-CM | POA: Diagnosis not present

## 2014-02-21 DIAGNOSIS — F419 Anxiety disorder, unspecified: Secondary | ICD-10-CM | POA: Diagnosis not present

## 2014-02-21 DIAGNOSIS — Z9181 History of falling: Secondary | ICD-10-CM | POA: Diagnosis not present

## 2014-02-21 DIAGNOSIS — S82002A Unspecified fracture of left patella, initial encounter for closed fracture: Secondary | ICD-10-CM

## 2014-02-21 DIAGNOSIS — T8130XA Disruption of wound, unspecified, initial encounter: Secondary | ICD-10-CM | POA: Diagnosis not present

## 2014-02-21 DIAGNOSIS — S86819A Strain of other muscle(s) and tendon(s) at lower leg level, unspecified leg, initial encounter: Secondary | ICD-10-CM | POA: Diagnosis present

## 2014-02-21 DIAGNOSIS — R269 Unspecified abnormalities of gait and mobility: Secondary | ICD-10-CM | POA: Diagnosis not present

## 2014-02-21 DIAGNOSIS — M6281 Muscle weakness (generalized): Secondary | ICD-10-CM | POA: Diagnosis not present

## 2014-02-21 DIAGNOSIS — M150 Primary generalized (osteo)arthritis: Secondary | ICD-10-CM | POA: Diagnosis not present

## 2014-02-21 DIAGNOSIS — M25569 Pain in unspecified knee: Secondary | ICD-10-CM | POA: Diagnosis not present

## 2014-02-21 DIAGNOSIS — R262 Difficulty in walking, not elsewhere classified: Secondary | ICD-10-CM | POA: Diagnosis not present

## 2014-02-21 DIAGNOSIS — M25521 Pain in right elbow: Secondary | ICD-10-CM | POA: Diagnosis not present

## 2014-02-21 DIAGNOSIS — W1839XA Other fall on same level, initial encounter: Secondary | ICD-10-CM | POA: Diagnosis present

## 2014-02-21 LAB — BASIC METABOLIC PANEL
Anion gap: 9 (ref 5–15)
BUN: 10 mg/dL (ref 6–23)
CALCIUM: 8.5 mg/dL (ref 8.4–10.5)
CHLORIDE: 107 mmol/L (ref 96–112)
CO2: 23 mmol/L (ref 19–32)
CREATININE: 0.78 mg/dL (ref 0.50–1.10)
GFR calc Af Amer: >90 mL/min (ref >90)
GFR calc non Af Amer: 79 mL/min — ABNORMAL LOW (ref >90)
GLUCOSE: 114 mg/dL — AB (ref 70–99)
POTASSIUM: 3.7 mmol/L (ref 3.5–5.1)
Sodium: 139 mmol/L (ref 135–145)

## 2014-02-21 LAB — CBC WITH DIFFERENTIAL/PLATELET
BASOS PCT: 1 % (ref 0–1)
Basophils Absolute: 0 10*3/uL (ref 0.0–0.1)
Eosinophils Absolute: 0.1 10*3/uL (ref 0.0–0.7)
Eosinophils Relative: 2 % (ref 0–5)
HCT: 33.3 % — ABNORMAL LOW (ref 36.0–46.0)
HEMOGLOBIN: 10.7 g/dL — AB (ref 12.0–15.0)
Lymphocytes Relative: 20 % (ref 12–46)
Lymphs Abs: 1.2 10*3/uL (ref 0.7–4.0)
MCH: 28.1 pg (ref 26.0–34.0)
MCHC: 32.1 g/dL (ref 30.0–36.0)
MCV: 87.4 fL (ref 78.0–100.0)
Monocytes Absolute: 0.6 10*3/uL (ref 0.1–1.0)
Monocytes Relative: 9 % (ref 3–12)
Neutro Abs: 4.2 10*3/uL (ref 1.7–7.7)
Neutrophils Relative %: 68 % (ref 43–77)
PLATELETS: 409 10*3/uL — AB (ref 150–400)
RBC: 3.81 MIL/uL — ABNORMAL LOW (ref 3.87–5.11)
RDW: 13.7 % (ref 11.5–15.5)
WBC: 6.1 10*3/uL (ref 4.0–10.5)

## 2014-02-21 LAB — TYPE AND SCREEN
ABO/RH(D): B POS
Antibody Screen: NEGATIVE

## 2014-02-21 MED ORDER — ESOMEPRAZOLE MAGNESIUM 40 MG PO CPDR
40.0000 mg | DELAYED_RELEASE_CAPSULE | Freq: Two times a day (BID) | ORAL | Status: DC
  Administered 2014-02-21 – 2014-02-25 (×8): 40 mg via ORAL
  Filled 2014-02-21 (×11): qty 1

## 2014-02-21 MED ORDER — ALUM & MAG HYDROXIDE-SIMETH 200-200-20 MG/5ML PO SUSP
30.0000 mL | Freq: Four times a day (QID) | ORAL | Status: DC | PRN
  Administered 2014-02-21 – 2014-02-25 (×8): 30 mL via ORAL
  Filled 2014-02-21 (×8): qty 30

## 2014-02-21 MED ORDER — SERTRALINE HCL 100 MG PO TABS
100.0000 mg | ORAL_TABLET | Freq: Two times a day (BID) | ORAL | Status: DC
  Administered 2014-02-21 – 2014-02-25 (×8): 100 mg via ORAL
  Filled 2014-02-21 (×9): qty 1

## 2014-02-21 MED ORDER — METHOCARBAMOL 500 MG PO TABS
500.0000 mg | ORAL_TABLET | Freq: Four times a day (QID) | ORAL | Status: DC | PRN
  Administered 2014-02-21 – 2014-02-25 (×7): 500 mg via ORAL
  Filled 2014-02-21 (×7): qty 1

## 2014-02-21 MED ORDER — ATORVASTATIN CALCIUM 10 MG PO TABS
10.0000 mg | ORAL_TABLET | Freq: Every day | ORAL | Status: DC
  Administered 2014-02-21 – 2014-02-25 (×5): 10 mg via ORAL
  Filled 2014-02-21 (×5): qty 1

## 2014-02-21 MED ORDER — METHOCARBAMOL 1000 MG/10ML IJ SOLN
500.0000 mg | Freq: Four times a day (QID) | INTRAVENOUS | Status: DC | PRN
  Administered 2014-02-22: 500 mg via INTRAVENOUS
  Filled 2014-02-21 (×3): qty 5

## 2014-02-21 MED ORDER — HYDROMORPHONE HCL 1 MG/ML IJ SOLN
0.5000 mg | INTRAMUSCULAR | Status: DC | PRN
  Administered 2014-02-21 – 2014-02-23 (×8): 1 mg via INTRAVENOUS
  Filled 2014-02-21 (×9): qty 1

## 2014-02-21 MED ORDER — SODIUM CHLORIDE 0.9 % IV SOLN
INTRAVENOUS | Status: DC
Start: 2014-02-21 — End: 2014-02-22
  Administered 2014-02-21: 17:00:00 via INTRAVENOUS

## 2014-02-21 MED ORDER — OXYCODONE HCL 5 MG PO TABS
5.0000 mg | ORAL_TABLET | ORAL | Status: DC | PRN
  Administered 2014-02-21 – 2014-02-22 (×2): 15 mg via ORAL
  Administered 2014-02-23 (×3): 10 mg via ORAL
  Administered 2014-02-23: 5 mg via ORAL
  Administered 2014-02-23: 10 mg via ORAL
  Administered 2014-02-24 – 2014-02-25 (×5): 15 mg via ORAL
  Administered 2014-02-25 (×2): 10 mg via ORAL
  Filled 2014-02-21: qty 2
  Filled 2014-02-21 (×4): qty 3
  Filled 2014-02-21: qty 2
  Filled 2014-02-21: qty 1
  Filled 2014-02-21: qty 2
  Filled 2014-02-21 (×2): qty 3
  Filled 2014-02-21 (×3): qty 2
  Filled 2014-02-21: qty 3

## 2014-02-21 MED ORDER — OXYCODONE HCL 5 MG PO TABS
5.0000 mg | ORAL_TABLET | ORAL | Status: DC | PRN
  Administered 2014-02-21: 15 mg via ORAL
  Administered 2014-02-21: 10 mg via ORAL
  Filled 2014-02-21: qty 2
  Filled 2014-02-21: qty 3

## 2014-02-21 MED ORDER — CYCLOBENZAPRINE HCL 5 MG PO TABS
5.0000 mg | ORAL_TABLET | Freq: Four times a day (QID) | ORAL | Status: DC | PRN
  Administered 2014-02-21 – 2014-02-22 (×2): 5 mg via ORAL
  Filled 2014-02-21 (×2): qty 1

## 2014-02-21 MED ORDER — HYDROMORPHONE HCL 1 MG/ML IJ SOLN
1.0000 mg | Freq: Once | INTRAMUSCULAR | Status: AC
  Administered 2014-02-21: 1 mg via INTRAVENOUS
  Filled 2014-02-21: qty 1

## 2014-02-21 MED ORDER — ALPRAZOLAM 0.5 MG PO TABS
0.5000 mg | ORAL_TABLET | Freq: Every evening | ORAL | Status: DC | PRN
Start: 2014-02-21 — End: 2014-02-25
  Administered 2014-02-21 – 2014-02-24 (×4): 0.5 mg via ORAL
  Filled 2014-02-21 (×4): qty 1

## 2014-02-21 MED ORDER — NON FORMULARY: 40.0000 mg | Freq: Two times a day (BID) | Status: DC

## 2014-02-21 MED ORDER — ONDANSETRON HCL 4 MG PO TABS: 4.0000 mg | ORAL_TABLET | Freq: Four times a day (QID) | ORAL | Status: DC | PRN

## 2014-02-21 MED ORDER — ONDANSETRON HCL 4 MG/2ML IJ SOLN
4.0000 mg | Freq: Four times a day (QID) | INTRAMUSCULAR | Status: DC | PRN
  Administered 2014-02-22: 4 mg via INTRAVENOUS

## 2014-02-21 MED ORDER — HYDROMORPHONE HCL 1 MG/ML IJ SOLN
0.5000 mg | Freq: Once | INTRAMUSCULAR | Status: AC
  Administered 2014-02-21: 0.5 mg via INTRAVENOUS
  Filled 2014-02-21: qty 1

## 2014-02-21 MED ORDER — HYDROMORPHONE HCL 1 MG/ML IJ SOLN: 1.0000 mg | INTRAMUSCULAR | Status: DC | PRN

## 2014-02-21 MED ORDER — CEFAZOLIN SODIUM-DEXTROSE 2-3 GM-% IV SOLR
2.0000 g | INTRAVENOUS | Status: AC
  Administered 2014-02-22: 2 g via INTRAVENOUS

## 2014-02-21 MED ORDER — POTASSIUM CHLORIDE 20 MEQ PO PACK: 40.0000 meq | PACK | Freq: Every day | ORAL | Status: DC

## 2014-02-21 MED ORDER — ENOXAPARIN SODIUM 40 MG/0.4ML ~~LOC~~ SOLN
40.0000 mg | SUBCUTANEOUS | Status: DC
  Filled 2014-02-21 (×2): qty 0.4

## 2014-02-21 MED ORDER — ONDANSETRON HCL 4 MG/2ML IJ SOLN
4.0000 mg | Freq: Once | INTRAMUSCULAR | Status: AC
  Administered 2014-02-21: 4 mg via INTRAVENOUS
  Filled 2014-02-21: qty 2

## 2014-02-21 MED ORDER — SODIUM CHLORIDE 0.9 % IV SOLN
INTRAVENOUS | Status: DC
  Administered 2014-02-22 – 2014-02-23 (×2): via INTRAVENOUS

## 2014-02-21 NOTE — ED Notes (Signed)
Pt was in PT doing "leg bends" or squats. While knee was flexing with weight, she heard a loud pop when her knee gave out. Knee now swollen, red, painful, with red drainage coming from previous surgical incision. Left knee surgery done 01/27/14.

## 2014-02-21 NOTE — ED Provider Notes (Signed)
CSN: [id number]     Arrival date & time 02/21/14  1148 History   First MD Initiated Contact with Patient 02/21/14 1154     Chief Complaint  Patient presents with  . Knee Injury     (Consider location/radiation/quality/duration/timing/severity/associated sxs/prior Treatment) Patient is a 80 y.o. female presenting with fall. The history is provided by the patient.  Fall This is a new problem. The current episode started less than 1 hour ago. Episode frequency: once. The problem has not changed since onset.Pertinent negatives include no chest pain, no abdominal pain, no headaches and no shortness of breath. Nothing aggravates the symptoms. Nothing relieves the symptoms. She has tried nothing for the symptoms. The treatment provided no relief.    Past Medical History  Diagnosis Date  . Painful respiration   . Personal history of unspecified circulatory disease   . Palpitations   . Other and unspecified hyperlipidemia   . Esophageal reflux   . Depressive disorder, not elsewhere classified   . Anxiety state, unspecified   . Unspecified arthropathy, multiple sites   . Diaphragmatic hernia without mention of obstruction or gangrene   . External hemorrhoids without mention of complication   . Diverticulosis of colon (without mention of hemorrhage)   . Diarrhea   . Special screening for malignant neoplasms of other sites   . Unspecified vitamin D deficiency   . Other malaise and fatigue 08-11-11    hx. "chronic fatigue syndrome"  . Disorder of bone and cartilage, unspecified   . Helicobacter pylori (H. pylori)   . Macular degeneration (senile) of retina, unspecified   . Shortness of breath 08-11-11    with exertion only at present  . Unspecified cerebral artery occlusion with cerebral infarction 08-11-11    '97/ '06( TIA)-affected lt. side, no residual  . Gallstones 08-11-11    asymptomatic at present  . Personal history of other diseases of digestive system 08-11-11    "Irittable bowel  syndrome"  . Urinary incontinence 08-11-11    wears peripad daily  . Osteoarthrosis, unspecified whether generalized or localized, unspecified site 08-11-11    hx. "rhematoid arthritis", osteoarthritis, DDD, bursitis(hip)  . Lumbar spondylosis   . Complication of anesthesia     slow to wake up with surgery several years ago   . PONV (postoperative nausea and vomiting)    Past Surgical History  Procedure Laterality Date  . Appendectomy    . Tubal ligation    . Knee surgery  08-11-11    rt. knee scope  . Cataract extraction  08-11-11    Bilateral  . Back surgery  08-11-11    '11-hx. lumbar fusion with retained hardware  . Abdominal hysterectomy  08-11-11  . Total knee arthroplasty  08/16/2011    Procedure: TOTAL KNEE ARTHROPLASTY;  Surgeon: Gearlean Alf, MD;  Location: [facility];  Service: Orthopedics;  Laterality: Right;  . Total knee arthroplasty Left 01/27/2014    Procedure: LEFT TOTAL KNEE ARTHROPLASTY;  Surgeon: Gearlean Alf, MD;  Location: [facility];  Service: Orthopedics;  Laterality: Left;   Family History  Problem Relation Age of Onset  . Heart disease Father   . Diabetes Sister   . Coronary artery disease Brother   . Diabetes Brother   . Heart disease Brother   . Pancreatic cancer Daughter   . Colon cancer Neg Hx    History  Substance Use Topics  . Smoking status: Never Smoker   . Smokeless tobacco: Never Used  . Alcohol Use: No  OB History    No data available     Review of Systems  Constitutional: Negative for fever and fatigue.  HENT: Negative for congestion and drooling.   Eyes: Negative for pain.  Respiratory: Negative for cough and shortness of breath.   Cardiovascular: Negative for chest pain.  Gastrointestinal: Negative for nausea, vomiting, abdominal pain and diarrhea.  Genitourinary: Negative for dysuria and hematuria.  Musculoskeletal: Negative for back pain, gait problem and neck pain.  Skin: Negative for color change.  Neurological: Negative for  dizziness and headaches.  Hematological: Negative for adenopathy.  Psychiatric/Behavioral: Negative for behavioral problems.  All other systems reviewed and are negative.     Allergies  Alendronate sodium; Celecoxib; and Latex  Home Medications   Prior to Admission medications   Medication Sig Start Date End Date Taking? Authorizing Provider  acetaminophen (TYLENOL) 325 MG tablet Take 2 tablets (650 mg total) by mouth every 6 (six) hours as needed for mild pain (or Fever >/= 101). 01/29/14   Arlee Muslim, PA-C  ALPRAZolam Duanne Moron) 0.5 MG tablet Take 1 tablet (0.5 mg total) by mouth at bedtime as needed for sleep. 01/15/13   Abner Greenspan, MD  atorvastatin (LIPITOR) 10 MG tablet TAKE 1 TABLET DAILY 01/21/14   Abner Greenspan, MD  docusate sodium 100 MG CAPS Take 100 mg by mouth 2 (two) times daily. 01/29/14   Arlee Muslim, PA-C  esomeprazole (NEXIUM) 40 MG capsule Take 40 mg by mouth 2 (two) times daily before a meal.    Historical Provider, MD  methocarbamol (ROBAXIN) 500 MG tablet Take 1 tablet (500 mg total) by mouth every 6 (six) hours as needed for muscle spasms. 01/29/14   Arlee Muslim, PA-C  oxyCODONE (OXY IR/ROXICODONE) 5 MG immediate release tablet Take one tablet by mouth every 3 hours as needed for moderate pain; Take two tablets by mouth every 3 hours as needed for severe pain 02/03/14   Tiffany L Reed, DO  potassium chloride (KLOR-CON) 20 MEQ packet Take 40 mEq by mouth daily.    Historical Provider, MD  rivaroxaban (XARELTO) 10 MG TABS tablet Take 1 tablet (10 mg total) by mouth daily with breakfast. Take Xarelto for two and a half more weeks, then discontinue Xarelto. Once the patient has completed the Xarelto, they may resume the Plavix 75 mg daily. 01/29/14   Arlee Muslim, PA-C  sertraline (ZOLOFT) 100 MG tablet Take 100 mg by mouth 2 (two) times daily.     Historical Provider, MD   BP 139/62 mmHg  Pulse 89  Temp(Src) 98.5 F (36.9 C) (Oral)  Resp 22  SpO2 99% Physical Exam   Constitutional: She is oriented to person, place, and time. She appears well-developed and well-nourished.  HENT:  Head: Normocephalic.  Mouth/Throat: Oropharynx is clear and moist. No oropharyngeal exudate.  Eyes: Conjunctivae and EOM are normal. Pupils are equal, round, and reactive to light.  Neck: Normal range of motion. Neck supple.  Cardiovascular: Normal rate, regular rhythm, normal heart sounds and intact distal pulses.  Exam reveals no gallop and no friction rub.   No murmur heard. Pulmonary/Chest: Effort normal and breath sounds normal. No respiratory distress. She has no wheezes.  Abdominal: Soft. Bowel sounds are normal. There is no tenderness. There is no rebound and no guarding.  Musculoskeletal: She exhibits tenderness. She exhibits no edema.  Moderate swelling and old ecchymosis to the left anterior knee. A small 1 mm dehiscence is noted on the left anterior knee. Greatly limited range of  motion due to pain.  No focal hip tenderness bilaterally.  Mild right lumbar paraspinal tenderness to palpation.  No cervical tenderness to palpation.  Mild tenderness to palpation and bruising noted to the posterior right elbow.  Neurological: She is alert and oriented to person, place, and time.  Skin: Skin is warm and dry.  Psychiatric: She has a normal mood and affect. Her behavior is normal.  Nursing note and vitals reviewed.   ED Course  Procedures (including critical care time) Labs Review Labs Reviewed  CBC WITH DIFFERENTIAL/PLATELET - Abnormal; Notable for the following:    RBC 3.81 (*)    Hemoglobin 10.7 (*)    HCT 33.3 (*)    Platelets 409 (*)    All other components within normal limits  BASIC METABOLIC PANEL - Abnormal; Notable for the following:    Glucose, Bld 114 (*)    GFR calc non Af Amer 79 (*)    All other components within normal limits  TYPE AND SCREEN    Imaging Review Dg Pelvis 1-2 Views  02/21/2014   CLINICAL DATA:  Fall, right elbow pain, low  back pain, knee pain  EXAM: PELVIS - 1-2 VIEW  COMPARISON:  None.  FINDINGS: There is no evidence of pelvic fracture or diastasis. Degenerative changes are noted pubic symphysis. Mild degenerative changes bilateral hip joints with narrowing of superior hip joint space right greater than left. Metallic fixation material noted L4-L5 and S1 lumbosacral spine.  IMPRESSION: No acute fracture or subluxation. Mild degenerative changes. Postsurgical changes lumbosacral spine.   Electronically Signed   By: Lahoma Crocker M.D.   On: 02/21/2014 12:54   Dg Elbow Complete Right  02/21/2014   CLINICAL DATA:  Pain post fall, right elbow pain  EXAM: RIGHT ELBOW - COMPLETE 3+ VIEW  COMPARISON:  None.  FINDINGS: Four views of right elbow submitted. No acute fracture or subluxation. No radiopaque foreign body. No posterior fat pad sign.  IMPRESSION: Negative.   Electronically Signed   By: Lahoma Crocker M.D.   On: 02/21/2014 12:54   Dg Knee Complete 4 Views Left  02/21/2014   CLINICAL DATA:  Fall, left knee pain, right elbow hematoma  EXAM: LEFT KNEE - COMPLETE 4+ VIEW  COMPARISON:  None.  FINDINGS: Four views of the left knee submitted. There is displaced fracture of the inferior aspect of patella. There is displacement of inferior fracture fragment probable along retracted patellar tendon. Significant soft tissue swelling prepatellar and suprapatellar. Left knee prosthesis in anatomic alignment. No evidence of prosthesis loosening. Moderate joint effusion. Small amount of subcutaneous air prepatellar region.  IMPRESSION: There is displaced fracture of the inferior aspect of patella. There is displacement of inferior fracture fragment probable along retracted patellar tendon. Significant soft tissue swelling prepatellar and suprapatellar. Left knee prosthesis in anatomic alignment. No evidence of prosthesis loosening. Moderate joint effusion. Small amount of subcutaneous air prepatellar region.   Electronically Signed   By: Lahoma Crocker  M.D.   On: 02/21/2014 12:52     EKG Interpretation None      MDM   Final diagnoses:  Fall  Patellar fracture, left, closed, initial encounter    12:01 PM 80 y.o. female status post left total knee arthroplasty on 01/27/2014 on Plavix who presents with sudden left knee pain which began prior to arrival. She states that she was performing squats with her physical therapist when she felt a sudden pop in the left knee. She fell backwards landing on her bottom and  rolling onto her back. She denies hitting her head or loss of consciousness. She has a very small wound dehiscence noted on the left anterior knee. Approximately 1 mm in length. Will get pain control and imaging.  I discussed the results with the patient's orthopedist and the family. Ortho to admit.    Pamella Pert, MD 02/21/14 [phone]

## 2014-02-21 NOTE — Progress Notes (Addendum)
Notified [facility] rep, Tim of pt admission to follow for d/cancer needs Spoke with pt to assess fro re admission Pt did have rehab at snf, then discharged home with [facility] services Pt states she had a f/u appointment with pcp and have DME at home.

## 2014-02-21 NOTE — ED Notes (Signed)
Bed: WA07 Expected date:  Expected time:  Means of arrival:  Comments: EMS-knee pain

## 2014-02-21 NOTE — H&P (Signed)
[name] [name] is an 80 y.o. female.    Chief Complaint: Left knee patella tendon rupture S/P TKA   HPI: Pt is a 80 y.o. female complaining of left knee pain after hearing / feeling a pop while doing rehabilitation at home for a TKA.  Her TKA surgery was preformed on 01/27/2014 by Dr. Wynelle Link.  She states that she was doing quite well with the knee surgery until today's incident.  Since then she has had extreme pain in the left leg as well as the inable to bear weight because of pain.  She was subsequently brought to the ER by EMS.  X-rays in the ER revealed a displaced fracture of the inferior aspect of patella. There is displacement of inferior fracture fragment probable along retracted patellar tendon. Significant soft tissue swelling prepatellar and suprapatellar. Left knee prosthesis in anatomic alignment. No evidence of prosthesis loosening. Moderate joint effusion.  Dr. Wynelle Link has reviewed the the findings will admit the patient. I have discussed the findings with the patient and she understands and knows that she will require another surgery. Dr. Wynelle Link will plan on doing the surgery either Monday (02/24/2014) or Wednesday (02/26/2014).  Risks, benefits and expectations were discussed with the patient.   Patient understand the risks, benefits and expectations and wishes to proceed with surgery.   PCP: Loura Pardon, MD  PMH: Past Medical History  Diagnosis Date  . Painful respiration   . Personal history of unspecified circulatory disease   . Palpitations   . Other and unspecified hyperlipidemia   . Esophageal reflux   . Depressive disorder, not elsewhere classified   . Anxiety state, unspecified   . Unspecified arthropathy, multiple sites   . Diaphragmatic hernia without mention of obstruction or gangrene   . External hemorrhoids without mention of complication   . Diverticulosis of colon (without mention of hemorrhage)   . Diarrhea   . Special screening for malignant neoplasms of  other sites   . Unspecified vitamin D deficiency   . Other malaise and fatigue 08-11-11    hx. "chronic fatigue syndrome"  . Disorder of bone and cartilage, unspecified   . Helicobacter pylori (H. pylori)   . Macular degeneration (senile) of retina, unspecified   . Shortness of breath 08-11-11    with exertion only at present  . Unspecified cerebral artery occlusion with cerebral infarction 08-11-11    '97/ '06( TIA)-affected lt. side, no residual  . Gallstones 08-11-11    asymptomatic at present  . Personal history of other diseases of digestive system 08-11-11    "Irittable bowel syndrome"  . Urinary incontinence 08-11-11    wears peripad daily  . Osteoarthrosis, unspecified whether generalized or localized, unspecified site 08-11-11    hx. "rhematoid arthritis", osteoarthritis, DDD, bursitis(hip)  . Lumbar spondylosis   . Complication of anesthesia     slow to wake up with surgery several years ago   . PONV (postoperative nausea and vomiting)     PSH: Past Surgical History  Procedure Laterality Date  . Appendectomy    . Tubal ligation    . Knee surgery  08-11-11    rt. knee scope  . Cataract extraction  08-11-11    Bilateral  . Back surgery  08-11-11    '11-hx. lumbar fusion with retained hardware  . Abdominal hysterectomy  08-11-11  . Total knee arthroplasty  08/16/2011    Procedure: TOTAL KNEE ARTHROPLASTY;  Surgeon: Gearlean Alf, MD;  Location: [facility];  Service: Orthopedics;  Laterality: Right;  . Total knee arthroplasty Left 01/27/2014    Procedure: LEFT TOTAL KNEE ARTHROPLASTY;  Surgeon: Gearlean Alf, MD;  Location: [facility];  Service: Orthopedics;  Laterality: Left;    Social History:  reports that she has never smoked. She has never used smokeless tobacco. She reports that she does not drink alcohol or use illicit drugs.  Allergies:  Allergies  Allergen Reactions  . Alendronate Sodium     REACTION: JAW PAIN  . Celecoxib     REACTION: GI UPSET  . Latex      Medications: No current facility-administered medications for this encounter.   Current Outpatient Prescriptions  Medication Sig Dispense Refill  . ALPRAZolam (XANAX) 0.5 MG tablet Take 1 tablet (0.5 mg total) by mouth at bedtime as needed for sleep. 90 tablet 1  . atorvastatin (LIPITOR) 10 MG tablet TAKE 1 TABLET DAILY (Patient taking differently: Take 1 tablet daily.) 90 tablet 0  . clopidogrel (PLAVIX) 75 MG tablet Take 75 mg by mouth daily.    Marland Kitchen esomeprazole (NEXIUM) 40 MG capsule Take 40 mg by mouth 2 (two) times daily before a meal.    . HYDROcodone-acetaminophen (NORCO) 10-325 MG per tablet Take 1 tablet by mouth every 6 (six) hours as needed (pain.).    Marland Kitchen methocarbamol (ROBAXIN) 500 MG tablet Take 1 tablet (500 mg total) by mouth every 6 (six) hours as needed for muscle spasms. 90 tablet 0  . oxyCODONE (OXY IR/ROXICODONE) 5 MG immediate release tablet Take one tablet by mouth every 3 hours as needed for moderate pain; Take two tablets by mouth every 3 hours as needed for severe pain 360 tablet 0  . sertraline (ZOLOFT) 100 MG tablet Take 100 mg by mouth 2 (two) times daily.     Marland Kitchen acetaminophen (TYLENOL) 325 MG tablet Take 2 tablets (650 mg total) by mouth every 6 (six) hours as needed for mild pain (or Fever >/= 101). (Patient not taking: Reported on 02/21/2014) 40 tablet 0  . docusate sodium 100 MG CAPS Take 100 mg by mouth 2 (two) times daily. (Patient not taking: Reported on 02/21/2014) 10 capsule 0  . potassium chloride (KLOR-CON) 20 MEQ packet Take 40 mEq by mouth daily.    . rivaroxaban (XARELTO) 10 MG TABS tablet Take 1 tablet (10 mg total) by mouth daily with breakfast. Take Xarelto for two and a half more weeks, then discontinue Xarelto. Once the patient has completed the Xarelto, they may resume the Plavix 75 mg daily. (Patient not taking: Reported on 02/21/2014) 19 tablet 0    Results for orders placed or performed during the hospital encounter of 02/21/14 (from the past 48  hour(s))  CBC with Differential/Platelet     Status: Abnormal   Collection Time: 02/21/14 12:11 PM  Result Value Ref Range   WBC 6.1 4.0 - 10.5 K/uL   RBC 3.81 (L) 3.87 - 5.11 MIL/uL   Hemoglobin 10.7 (L) 12.0 - 15.0 g/dL   HCT 33.3 (L) 36.0 - 46.0 %   MCV 87.4 78.0 - 100.0 fL   MCH 28.1 26.0 - 34.0 pg   MCHC 32.1 30.0 - 36.0 g/dL   RDW 13.7 11.5 - 15.5 %   Platelets 409 (H) 150 - 400 K/uL   Neutrophils Relative % 68 43 - 77 %   Neutro Abs 4.2 1.7 - 7.7 K/uL   Lymphocytes Relative 20 12 - 46 %   Lymphs Abs 1.2 0.7 - 4.0 K/uL   Monocytes Relative 9 3 -  12 %   Monocytes Absolute 0.6 0.1 - 1.0 K/uL   Eosinophils Relative 2 0 - 5 %   Eosinophils Absolute 0.1 0.0 - 0.7 K/uL   Basophils Relative 1 0 - 1 %   Basophils Absolute 0.0 0.0 - 0.1 K/uL  Basic metabolic panel     Status: Abnormal   Collection Time: 02/21/14 12:11 PM  Result Value Ref Range   Sodium 139 135 - 145 mmol/L   Potassium 3.7 3.5 - 5.1 mmol/L   Chloride 107 96 - 112 mmol/L   CO2 23 19 - 32 mmol/L   Glucose, Bld 114 (H) 70 - 99 mg/dL   BUN 10 6 - 23 mg/dL   Creatinine, Ser 0.78 0.50 - 1.10 mg/dL   Calcium 8.5 8.4 - 10.5 mg/dL   GFR calc non Af Amer 79 (L) >90 mL/min   GFR calc Af Amer >90 >90 mL/min    Comment: (NOTE) The eGFR has been calculated using the CKD EPI equation. This calculation has not been validated in all clinical situations. eGFR's persistently <90 mL/min signify possible Chronic Kidney Disease.    Anion gap 9 5 - 15   Dg Pelvis 1-2 Views  02/21/2014   CLINICAL DATA:  Fall, right elbow pain, low back pain, knee pain  EXAM: PELVIS - 1-2 VIEW  COMPARISON:  None.  FINDINGS: There is no evidence of pelvic fracture or diastasis. Degenerative changes are noted pubic symphysis. Mild degenerative changes bilateral hip joints with narrowing of superior hip joint space right greater than left. Metallic fixation material noted L4-L5 and S1 lumbosacral spine.  IMPRESSION: No acute fracture or subluxation.  Mild degenerative changes. Postsurgical changes lumbosacral spine.   Electronically Signed   By: Lahoma Crocker M.D.   On: 02/21/2014 12:54   Dg Elbow Complete Right  02/21/2014   CLINICAL DATA:  Pain post fall, right elbow pain  EXAM: RIGHT ELBOW - COMPLETE 3+ VIEW  COMPARISON:  None.  FINDINGS: Four views of right elbow submitted. No acute fracture or subluxation. No radiopaque foreign body. No posterior fat pad sign.  IMPRESSION: Negative.   Electronically Signed   By: Lahoma Crocker M.D.   On: 02/21/2014 12:54   Dg Knee Complete 4 Views Left  02/21/2014   CLINICAL DATA:  Fall, left knee pain, right elbow hematoma  EXAM: LEFT KNEE - COMPLETE 4+ VIEW  COMPARISON:  None.  FINDINGS: Four views of the left knee submitted. There is displaced fracture of the inferior aspect of patella. There is displacement of inferior fracture fragment probable along retracted patellar tendon. Significant soft tissue swelling prepatellar and suprapatellar. Left knee prosthesis in anatomic alignment. No evidence of prosthesis loosening. Moderate joint effusion. Small amount of subcutaneous air prepatellar region.  IMPRESSION: There is displaced fracture of the inferior aspect of patella. There is displacement of inferior fracture fragment probable along retracted patellar tendon. Significant soft tissue swelling prepatellar and suprapatellar. Left knee prosthesis in anatomic alignment. No evidence of prosthesis loosening. Moderate joint effusion. Small amount of subcutaneous air prepatellar region.   Electronically Signed   By: Lahoma Crocker M.D.   On: 02/21/2014 12:52    Review of Systems  Constitutional: Negative.   HENT: Negative.   Eyes: Negative.   Respiratory: Negative.   Cardiovascular: Positive for palpitations.  Gastrointestinal: Positive for heartburn.  Genitourinary: Negative.   Musculoskeletal: Positive for joint pain.  Skin: Negative.   Neurological: Negative.   Endo/Heme/Allergies: Negative.    Psychiatric/Behavioral: Positive for depression. The patient  is nervous/anxious.       Physical Exam  Constitutional: She is oriented to person, place, and time. She appears well-developed and well-nourished.  HENT:  Head: Normocephalic and atraumatic.  Eyes: Pupils are equal, round, and reactive to light.  Neck: Neck supple. No JVD present. No tracheal deviation present. No thyromegaly present.  Cardiovascular: Normal rate, regular rhythm, normal heart sounds and intact distal pulses.   Respiratory: Effort normal and breath sounds normal. No respiratory distress. She has no wheezes.  GI: Soft. There is no tenderness. There is no guarding.  Musculoskeletal:       Right elbow: She exhibits decreased range of motion and swelling. She exhibits no laceration. Tenderness found.       Left knee: She exhibits decreased range of motion, effusion, deformity, abnormal alignment and abnormal patellar mobility. She exhibits no ecchymosis and no erythema. Tenderness found. Patellar tendon tenderness noted.  Lymphadenopathy:    She has no cervical adenopathy.  Neurological: She is alert and oriented to person, place, and time.  Skin: Skin is warm and dry.  Psychiatric: She has a normal mood and affect.     Assessment/Plan Assessment: Left knee patella tendon rupture S/P TKA  Plan: Patient will undergo an ORIF of the left patella tendon per Dr. Wynelle Link at [facility]. Risks benefits and expectations were discussed with the patient. Patient understand risks, benefits and expectations and wishes to proceed.   West Pugh Babish   PA-C  02/21/2014, 2:40 PM

## 2014-02-22 ENCOUNTER — Observation Stay (HOSPITAL_COMMUNITY): Payer: Medicare Other | Admitting: Certified Registered"

## 2014-02-22 ENCOUNTER — Encounter (HOSPITAL_COMMUNITY): Payer: Self-pay | Admitting: Certified Registered"

## 2014-02-22 ENCOUNTER — Inpatient Hospital Stay (HOSPITAL_COMMUNITY): Payer: Medicare Other

## 2014-02-22 ENCOUNTER — Encounter (HOSPITAL_COMMUNITY)
Admission: EM | Disposition: A | Discharge status: Skilled Nursing Facility | Payer: Self-pay | Source: Home / Self Care | Attending: Orthopedic Surgery

## 2014-02-22 HISTORY — PX: PATELLAR TENDON REPAIR: SHX737

## 2014-02-22 SURGERY — REPAIR, TENDON, PATELLAR
Anesthesia: General | Site: Knee | Laterality: Left

## 2014-02-22 MED ORDER — CEFAZOLIN SODIUM-DEXTROSE 2-3 GM-% IV SOLR
INTRAVENOUS | Status: AC
  Filled 2014-02-22: qty 50

## 2014-02-22 MED ORDER — PROPOFOL 10 MG/ML IV BOLUS
INTRAVENOUS | Status: AC
  Filled 2014-02-22: qty 20

## 2014-02-22 MED ORDER — HYDROMORPHONE HCL 1 MG/ML IJ SOLN
INTRAMUSCULAR | Status: AC
  Filled 2014-02-22: qty 1

## 2014-02-22 MED ORDER — CLOPIDOGREL BISULFATE 75 MG PO TABS
75.0000 mg | ORAL_TABLET | Freq: Every day | ORAL | Status: DC
  Administered 2014-02-22 – 2014-02-25 (×4): 75 mg via ORAL
  Filled 2014-02-22 (×4): qty 1

## 2014-02-22 MED ORDER — FENTANYL CITRATE 0.05 MG/ML IJ SOLN
INTRAMUSCULAR | Status: DC | PRN
  Administered 2014-02-22 (×4): 50 ug via INTRAVENOUS

## 2014-02-22 MED ORDER — DOCUSATE SODIUM 100 MG PO CAPS
100.0000 mg | ORAL_CAPSULE | Freq: Two times a day (BID) | ORAL | Status: DC
  Administered 2014-02-22 – 2014-02-24 (×6): 100 mg via ORAL

## 2014-02-22 MED ORDER — LIDOCAINE HCL (CARDIAC) 20 MG/ML IV SOLN
INTRAVENOUS | Status: DC | PRN
  Administered 2014-02-22: 20 mg via INTRAVENOUS

## 2014-02-22 MED ORDER — DEXAMETHASONE SODIUM PHOSPHATE 10 MG/ML IJ SOLN
INTRAMUSCULAR | Status: DC | PRN
  Administered 2014-02-22: 10 mg via INTRAVENOUS

## 2014-02-22 MED ORDER — 0.9 % SODIUM CHLORIDE (POUR BTL) OPTIME
TOPICAL | Status: DC | PRN
  Administered 2014-02-22: 1000 mL

## 2014-02-22 MED ORDER — CALCIUM CARBONATE ANTACID 500 MG PO CHEW
1.0000 | CHEWABLE_TABLET | Freq: Four times a day (QID) | ORAL | Status: DC | PRN
  Administered 2014-02-22 – 2014-02-23 (×2): 200 mg via ORAL
  Filled 2014-02-22 (×2): qty 1

## 2014-02-22 MED ORDER — FENTANYL CITRATE 0.05 MG/ML IJ SOLN
INTRAMUSCULAR | Status: AC
  Filled 2014-02-22: qty 2

## 2014-02-22 MED ORDER — EPHEDRINE SULFATE 50 MG/ML IJ SOLN
INTRAMUSCULAR | Status: DC | PRN
  Administered 2014-02-22: 5 mg via INTRAVENOUS

## 2014-02-22 MED ORDER — LIP MEDEX EX OINT
TOPICAL_OINTMENT | CUTANEOUS | Status: AC
  Filled 2014-02-22: qty 7

## 2014-02-22 MED ORDER — EPHEDRINE SULFATE 50 MG/ML IJ SOLN
INTRAMUSCULAR | Status: AC
  Filled 2014-02-22: qty 1

## 2014-02-22 MED ORDER — HYDROMORPHONE HCL 1 MG/ML IJ SOLN: 0.2500 mg | INTRAMUSCULAR | Status: DC | PRN

## 2014-02-22 MED ORDER — BISACODYL 10 MG RE SUPP: 10.0000 mg | Freq: Every day | RECTAL | Status: DC | PRN

## 2014-02-22 MED ORDER — FLEET ENEMA 7-19 GM/118ML RE ENEM: 1.0000 | ENEMA | Freq: Once | RECTAL | Status: AC | PRN

## 2014-02-22 MED ORDER — METOCLOPRAMIDE HCL 10 MG PO TABS: 5.0000 mg | ORAL_TABLET | Freq: Three times a day (TID) | ORAL | Status: DC | PRN

## 2014-02-22 MED ORDER — ONDANSETRON HCL 4 MG PO TABS: 4.0000 mg | ORAL_TABLET | Freq: Four times a day (QID) | ORAL | Status: DC | PRN

## 2014-02-22 MED ORDER — PROPOFOL 10 MG/ML IV BOLUS
INTRAVENOUS | Status: DC | PRN
  Administered 2014-02-22: 130 mg via INTRAVENOUS

## 2014-02-22 MED ORDER — LACTATED RINGERS IV SOLN: INTRAVENOUS | Status: DC

## 2014-02-22 MED ORDER — POLYETHYLENE GLYCOL 3350 17 G PO PACK
17.0000 g | PACK | Freq: Every day | ORAL | Status: DC | PRN
Start: 2014-02-22 — End: 2014-02-25

## 2014-02-22 MED ORDER — HYDROMORPHONE HCL 1 MG/ML IJ SOLN
0.2500 mg | INTRAMUSCULAR | Status: DC | PRN
  Administered 2014-02-22 (×5): 0.5 mg via INTRAVENOUS

## 2014-02-22 MED ORDER — LACTATED RINGERS IV SOLN
INTRAVENOUS | Status: DC | PRN
  Administered 2014-02-22: 08:00:00 via INTRAVENOUS

## 2014-02-22 MED ORDER — ONDANSETRON HCL 4 MG/2ML IJ SOLN
4.0000 mg | Freq: Four times a day (QID) | INTRAMUSCULAR | Status: DC | PRN
  Administered 2014-02-23: 4 mg via INTRAVENOUS
  Filled 2014-02-22 (×2): qty 2

## 2014-02-22 MED ORDER — DEXAMETHASONE SODIUM PHOSPHATE 10 MG/ML IJ SOLN
INTRAMUSCULAR | Status: AC
  Filled 2014-02-22: qty 1

## 2014-02-22 MED ORDER — MIDAZOLAM HCL 2 MG/2ML IJ SOLN
INTRAMUSCULAR | Status: AC
  Filled 2014-02-22: qty 2

## 2014-02-22 MED ORDER — CEFAZOLIN SODIUM-DEXTROSE 2-3 GM-% IV SOLR
2.0000 g | Freq: Four times a day (QID) | INTRAVENOUS | Status: AC
  Administered 2014-02-22 – 2014-02-23 (×3): 2 g via INTRAVENOUS
  Filled 2014-02-22 (×3): qty 50

## 2014-02-22 MED ORDER — METOCLOPRAMIDE HCL 5 MG/ML IJ SOLN
5.0000 mg | Freq: Three times a day (TID) | INTRAMUSCULAR | Status: DC | PRN
  Administered 2014-02-23: 10 mg via INTRAVENOUS
  Filled 2014-02-22: qty 2

## 2014-02-22 MED ORDER — HYDROMORPHONE HCL 1 MG/ML IJ SOLN
INTRAMUSCULAR | Status: AC
  Administered 2014-02-23: 1 mg via INTRAVENOUS
  Filled 2014-02-22: qty 1

## 2014-02-22 MED ORDER — SODIUM CHLORIDE 0.9 % IJ SOLN
INTRAMUSCULAR | Status: AC
  Filled 2014-02-22: qty 10

## 2014-02-22 MED ORDER — ONDANSETRON HCL 4 MG/2ML IJ SOLN: 4.0000 mg | Freq: Once | INTRAMUSCULAR | Status: DC | PRN

## 2014-02-22 SURGICAL SUPPLY — 46 items
BAG ZIPLOCK 12X15 (MISCELLANEOUS) ×3 IMPLANT
BANDAGE ELASTIC 6 VELCRO ST LF (GAUZE/BANDAGES/DRESSINGS) ×3 IMPLANT
BANDAGE ESMARK 6X9 LF (GAUZE/BANDAGES/DRESSINGS) ×1 IMPLANT
BIT DRILL 2.4X128 (BIT) ×2 IMPLANT
BIT DRILL 2.4X128MM (BIT) ×1
BNDG ESMARK 6X9 LF (GAUZE/BANDAGES/DRESSINGS) ×3
CLOSURE STERI-STRIP 1/4X4 (GAUZE/BANDAGES/DRESSINGS) ×3 IMPLANT
CLOSURE WOUND 1/2 X4 (GAUZE/BANDAGES/DRESSINGS) ×2
CUFF TOURN SGL QUICK 34 (TOURNIQUET CUFF) ×2
CUFF TRNQT CYL 34X4X40X1 (TOURNIQUET CUFF) ×1 IMPLANT
DRAPE INCISE IOBAN 66X45 STRL (DRAPES) ×3 IMPLANT
DRAPE POUCH INSTRU U-SHP 10X18 (DRAPES) ×3 IMPLANT
DRAPE U-SHAPE 47X51 STRL (DRAPES) ×3 IMPLANT
DRSG ADAPTIC 3X8 NADH LF (GAUZE/BANDAGES/DRESSINGS) ×3 IMPLANT
DRSG PAD ABDOMINAL 8X10 ST (GAUZE/BANDAGES/DRESSINGS) ×3 IMPLANT
DURAPREP 26ML APPLICATOR (WOUND CARE) ×3 IMPLANT
ELECT REM PT RETURN 9FT ADLT (ELECTROSURGICAL) ×3
ELECTRODE REM PT RTRN 9FT ADLT (ELECTROSURGICAL) ×1 IMPLANT
EVACUATOR 1/8 PVC DRAIN (DRAIN) ×3 IMPLANT
GAUZE SPONGE 4X4 12PLY STRL (GAUZE/BANDAGES/DRESSINGS) ×3 IMPLANT
GLOVE BIO SURGEON STRL SZ7.5 (GLOVE) ×3 IMPLANT
GLOVE BIO SURGEON STRL SZ8 (GLOVE) ×3 IMPLANT
GLOVE BIOGEL PI IND STRL 8 (GLOVE) ×2 IMPLANT
GLOVE BIOGEL PI INDICATOR 8 (GLOVE) ×4
GOWN STRL REUS W/TWL LRG LVL3 (GOWN DISPOSABLE) ×3 IMPLANT
GOWN STRL REUS W/TWL XL LVL3 (GOWN DISPOSABLE) ×3 IMPLANT
IMMOBILIZER KNEE 20 (SOFTGOODS) ×6 IMPLANT
IMMOBILIZER KNEE 20 THIGH 36 (SOFTGOODS) ×1 IMPLANT
MANIFOLD NEPTUNE II (INSTRUMENTS) ×3 IMPLANT
NEEDLE MA TROC 1/2 (NEEDLE) ×3 IMPLANT
NEEDLE MAYO .5 CIRCLE (NEEDLE) ×3 IMPLANT
PACK TOTAL JOINT (CUSTOM PROCEDURE TRAY) ×3 IMPLANT
PAD ABD 8X10 STRL (GAUZE/BANDAGES/DRESSINGS) ×3 IMPLANT
PADDING CAST COTTON 6X4 STRL (CAST SUPPLIES) ×6 IMPLANT
PASSER SUT SWANSON 36MM LOOP (INSTRUMENTS) ×3 IMPLANT
POSITIONER SURGICAL ARM (MISCELLANEOUS) ×3 IMPLANT
STRIP CLOSURE SKIN 1/2X4 (GAUZE/BANDAGES/DRESSINGS) ×4 IMPLANT
SUT ETHIBOND NAB CT1 #1 30IN (SUTURE) ×6 IMPLANT
SUT FIBERWIRE #2 38 T-5 BLUE (SUTURE) ×6
SUT MNCRL AB 4-0 PS2 18 (SUTURE) ×3 IMPLANT
SUT VIC AB 1 CT1 27 (SUTURE)
SUT VIC AB 1 CT1 27XBRD ANTBC (SUTURE) IMPLANT
SUT VIC AB 2-0 CT1 27 (SUTURE) ×6
SUT VIC AB 2-0 CT1 TAPERPNT 27 (SUTURE) ×3 IMPLANT
SUTURE FIBERWR #2 38 T-5 BLUE (SUTURE) ×2 IMPLANT
TOWEL OR 17X26 10 PK STRL BLUE (TOWEL DISPOSABLE) ×3 IMPLANT

## 2014-02-22 NOTE — Anesthesia Procedure Notes (Signed)
Procedure Name: LMA Insertion Date/Time: 02/22/2014 8:33 AM Performed by: Lajuana Carry E Pre-anesthesia Checklist: Patient identified, Emergency Drugs available, Suction available and Patient being monitored Patient Re-evaluated:Patient Re-evaluated prior to inductionOxygen Delivery Method: Circle system utilized Preoxygenation: Pre-oxygenation with 100% oxygen Intubation Type: IV induction Ventilation: Mask ventilation without difficulty LMA: LMA inserted LMA Size: 4.0 Number of attempts: 1 Dental Injury: Teeth and Oropharynx as per pre-operative assessment

## 2014-02-22 NOTE — Progress Notes (Signed)
Utilization Review completed.  

## 2014-02-22 NOTE — Brief Op Note (Signed)
02/21/2014 - 02/22/2014  9:45 AM  PATIENT:  [name] [name]  80 y.o. female  PRE-OPERATIVE DIAGNOSIS:  left patella tendon tear  POST-OPERATIVE DIAGNOSIS:  left patella tendon tear  PROCEDURE:  Procedure(s): PATELLA TENDON REPAIR (Left)  SURGEON:  Surgeon(s) and Role:    Gearlean Alf, MD - Primary  PHYSICIAN ASSISTANT:   ASSISTANTS:   ANESTHESIA:   general  EBL:     BLOOD ADMINISTERED:none  DRAINS: (Medium) Hemovact drain(s) in the left knee with  Suction Open   LOCAL MEDICATIONS USED:  NONE  COUNTS:  YES  TOURNIQUET:   Total Tourniquet Time Documented: Thigh (Left) - 29 minutes Total: Thigh (Left) - 29 minutes   DICTATION: .Other Dictation: Dictation Number [id number]  PLAN OF CARE: Admit to inpatient   PATIENT DISPOSITION:  PACU - hemodynamically stable.

## 2014-02-22 NOTE — Anesthesia Preprocedure Evaluation (Addendum)
Anesthesia Evaluation  Patient identified by MRN, date of birth, ID band Patient awake    Reviewed: Allergy & Precautions, NPO status , Patient's Chart, lab work & pertinent test results  History of Anesthesia Complications (+) PONV  Airway        Dental   Pulmonary          Cardiovascular + Peripheral Vascular Disease     Neuro/Psych CVA    GI/Hepatic GERD-  ,  Endo/Other    Renal/GU      Musculoskeletal  (+) Arthritis -,   Abdominal   Peds  Hematology  (+) anemia ,   Anesthesia Other Findings GI disorders  Reproductive/Obstetrics                            Anesthesia Physical Anesthesia Plan  ASA: III  Anesthesia Plan: General   Post-op Pain Management:    Induction: Intravenous  Airway Management Planned: LMA and Oral ETT  Additional Equipment:   Intra-op Plan:   Post-operative Plan: Extubation in OR  Informed Consent: I have reviewed the patients History and Physical, chart, labs and discussed the procedure including the risks, benefits and alternatives for the proposed anesthesia with the patient or authorized representative who has indicated his/her understanding and acceptance.     Plan Discussed with: CRNA, Anesthesiologist and Surgeon  Anesthesia Plan Comments:         Anesthesia Quick Evaluation

## 2014-02-22 NOTE — Anesthesia Postprocedure Evaluation (Signed)
  Anesthesia Post-op Note  Patient: [name] [name]  Procedure(s) Performed: Procedure(s): PATELLA TENDON REPAIR (Left)  Patient Location: PACU  Anesthesia Type:General  Level of Consciousness: awake, alert , oriented and patient cooperative  Airway and Oxygen Therapy: Patient Spontanous Breathing  Post-op Pain: mild  Post-op Assessment: Post-op Vital signs reviewed, Patient's Cardiovascular Status Stable, Respiratory Function Stable, Patent Airway, No signs of Nausea or vomiting and Pain level controlled  Post-op Vital Signs: stable  Last Vitals:  Filed Vitals:   02/22/14 0611  BP: 115/50  Pulse: 83  Temp: 37 C  Resp: 18    Complications: No apparent anesthesia complications

## 2014-02-22 NOTE — Progress Notes (Signed)
CARE MANAGEMENT NOTE 02/22/2014  Patient:  LILU, MCGLOWN   Account Number:  [id number]  Date Initiated:  02/22/2014  Documentation initiated by:  [facility]  Subjective/Objective Assessment:   Left patella tendon repair     Action/Plan:   Anticipated DC Date:  02/23/2014   Anticipated DC Plan:  [place]  CM consult      Choice offered to / List presented to:          [facility] arranged  HH-2 PT      [place]   Status of service:  Completed, signed off Medicare Important Message given?   (If response is "NO", the following Medicare IM given date fields will be blank) Date Medicare IM given:   Medicare IM given by:   Date Additional Medicare IM given:   Additional Medicare IM given by:    Discharge Disposition:  [place]  Per UR Regulation:    If discussed at Long Length of Stay Meetings, dates discussed:    Comments:  03/14/2014 1145 NCM received message from [place] pt was active with [facility] PT. She has DME at home. See NCM ED note. Waiting final recommendations. Jonnie Finner RN CCM Case Mgmt phone [phone]

## 2014-02-22 NOTE — Op Note (Signed)
[name] [name], [name] [name]             ACCOUNT NO.:  [id number]  MEDICAL RECORD NO.:  [id number]  LOCATION:  7169                         FACILITY:  [facility]  PHYSICIAN:  Gaynelle Arabian, M.D.    DATE OF BIRTH:  December 25, 1934  DATE OF PROCEDURE:  02/22/2014 DATE OF DISCHARGE:                              OPERATIVE REPORT   PREOPERATIVE DIAGNOSIS:  Left patella tendon rupture.  POSTOPERATIVE DIAGNOSIS:  Left patella tendon rupture.  PROCEDURE:  Left patella tendon repair.  SURGEON:  Gaynelle Arabian, M.D.  ASSISTANT:  No assistants.  ANESTHESIA:  General.  ESTIMATED BLOOD LOSS:  Minimal.  DRAINS:  Hemovac x1.  COMPLICATIONS:  None.  TOURNIQUET TIME:  29 minutes at 300 mmHg.  CONDITION:  Stable to recovery.  BRIEF CLINICAL NOTE:  [name] is a 80 year old female, had a left total knee arthroplasty done approximately 4 weeks ago.  She was doing well and then yesterday with home PT was doing mini squats and felt a pop in her knee.  She was taken to the emergency room, where radiographs showed an inferior pole patella tendon fracture.  She presents now for patella tendon repair.  PROCEDURE IN DETAIL:  After successful administration of general anesthetic, a tourniquet was placed high on her left thigh.  Left lower extremity was prepped and draped in the usual sterile fashion. Extremities were wrapped in Esmarch.  Tourniquet was inflated to 300 mmHg.  Previous midline incisions were utilized.  Skin was cut with a 10 blade through subcutaneous tissue.  There was a large hematoma present in the subcu tissue.  The tendon rupture was obvious at the inferior pole of the patella.  The medial and lateral retinaculum was also torn. I thoroughly evacuated hematoma and thoroughly irrigated with saline solution.  Hematoma was all removed.  There was a tiny piece of bone attached to the patellar tendon which I removed.  I then weaved two #2 FiberWire sutures, one at the medial aspect of the  patellar tendon, one at the lateral aspect of the patella tendon, starting at the tibial tubercle and coursing up to the site where the tear was; then, drilled 3 holes in the patella and passed the sutures from inferior to superior. The sutures were tied and the tendon was tightly repaired back to the patella.  It was then oversewn with Ethibond suture.  I repaired the medial and lateral retinaculum with Ethibond suture.  I then flexed her against gravity and she had 90 degrees easily.  The tourniquet was then released for a total time of 29 minutes.  Subcu was then closed over Hemovac drain with interrupted 2-0 Vicryl.  We utilized a drain because she was on Plavix.  Subcuticular was then closed with a running 4-0 Monocryl.  The incision was cleaned and dried and Steri-Strips and a bulky sterile dressing applied.  She was placed into a knee immobilizer, awakened and transported to Recovery in stable condition.     Gaynelle Arabian, M.D.     FA/MEDQ  D:  02/22/2014  T:  02/22/2014  Job:  [id number]

## 2014-02-22 NOTE — Transfer of Care (Signed)
Immediate Anesthesia Transfer of Care Note  Patient: [name] [name]  Procedure(s) Performed: Procedure(s) (LRB): PATELLA TENDON REPAIR (Left)  Patient Location: PACU  Anesthesia Type: General  Level of Consciousness: sedated, patient cooperative and responds to stimulation  Airway & Oxygen Therapy: Patient Spontanous Breathing and Patient connected to face mask oxgen  Post-op Assessment: Report given to PACU RN and Post -op Vital signs reviewed and stable  Post vital signs: Reviewed and stable  Complications: No apparent anesthesia complications

## 2014-02-22 NOTE — Progress Notes (Signed)
   Subjective: Pt c/o continued moderate left knee pain s/p patella fracture and retracted tendon Scheduled for surgery later this morning by Dr. Wynelle Link  Currently NPO  Denies any new symptoms or issues Patient reports pain as moderate.  Objective:   VITALS:   Filed Vitals:   02/22/14 0611  BP: 115/50  Pulse: 83  Temp: 98.6 F (37 C)  Resp: 18    Left knee in dressing and knee immobilizer nv intact distally   LABS  Recent Labs  02/21/14 1211  HGB 10.7*  HCT 33.3*  WBC 6.1  PLT 409*     Recent Labs  02/21/14 1211  NA 139  K 3.7  BUN 10  CREATININE 0.78  GLUCOSE 114*     Assessment/Plan: Left patella fracture s/p total knee arthroplasty Plan for surgery later this morning Keep NPO Pain management as needed    Merla Riches, MPAS, PA-C  02/22/2014, 7:27 AM

## 2014-02-23 ENCOUNTER — Other Ambulatory Visit: Payer: Self-pay

## 2014-02-23 NOTE — Progress Notes (Addendum)
Subjective: 1 Day Post-Op Procedure(s) (LRB): PATELLA TENDON REPAIR (Left) Patient reports pain as 4 on 0-10 scale.   C/o reflux. Position while eating. Objective: Vital signs in last 24 hours: Temp:  [97.3 F (36.3 C)-99 F (37.2 C)] 98.2 F (36.8 C) (02/07 0507) Pulse Rate:  [72-102] 84 (02/07 0507) Resp:  [16-18] 16 (02/07 0507) BP: (94-132)/(44-63) 109/44 mmHg (02/07 0507) SpO2:  [95 %-100 %] 96 % (02/07 0507)  Intake/Output from previous day: 02/06 0701 - 02/07 0700 In: 8657 [P.O.:240; I.V.:900; IV Piggyback:50] Out: 324 [Urine:200; Drains:124] Intake/Output this shift:     Recent Labs  02/21/14 1211  HGB 10.7*    Recent Labs  02/21/14 1211  WBC 6.1  RBC 3.81*  HCT 33.3*  PLT 409*    Recent Labs  02/21/14 1211  NA 139  K 3.7  CL 107  CO2 23  BUN 10  CREATININE 0.78  GLUCOSE 114*  CALCIUM 8.5   No results for input(s): LABPT, INR in the last 72 hours.  Neurologically intact Neurovascular intact Sensation intact distally Dorsiflexion/Plantar flexion intact Compartment soft  No DVT. Min drainage in HV since last.  Assessment/Plan: 1 Day Post-Op Procedure(s) (LRB): PATELLA TENDON REPAIR (Left) Advance diet Up with therapy D/C IV fluids  Nexium. Adjust bed. Discussed with Nursing. Hemovac d/c'd. Tip intact  Harden Bramer C 02/23/2014, 8:09 AM

## 2014-02-23 NOTE — Progress Notes (Signed)
Physical Therapy Treatment Patient Details Name: [name] [name] MRN: [id number] DOB: [birth year] Today's Date: 02/23/2014    History of Present Illness Pt s/p L TKR 1/16.  Pt states she was doing well and performing squats at home, heard a pop and fell to floor    PT Comments    Pt continues pain limited and requiring mod assist for very limited mobility.  Follow Up Recommendations  Home health PT;SNF     Equipment Recommendations  None recommended by PT    Recommendations for Other Services OT consult     Precautions / Restrictions Precautions Precautions: Fall;Knee Precaution Comments: NO ROM at L knee, KI all times Required Braces or Orthoses: Knee Immobilizer - Left Knee Immobilizer - Left: On at all times Restrictions Weight Bearing Restrictions: No LLE Weight Bearing: Weight bearing as tolerated Other Position/Activity Restrictions: WBAT with KI     Mobility  Bed Mobility Overal bed mobility: Needs Assistance Bed Mobility: Sit to Supine     Supine to sit: Mod assist Sit to supine: Mod assist   General bed mobility comments: cues for sequence and use of R LE to self assist  Transfers Overall transfer level: Needs assistance Equipment used: Rolling walker (2 wheeled) Transfers: Sit to/from Stand Sit to Stand: Mod assist Stand pivot transfers: Mod assist;+2 physical assistance;+2 safety/equipment       General transfer comment: cues for LE management and use of UEs to self assist.  Physical assist to manage L LE ant to bring weight up and fwd.    Ambulation/Gait Ambulation/Gait assistance: Mod assist Ambulation Distance (Feet): 3 Feet Assistive device: Rolling walker (2 wheeled) Gait Pattern/deviations: Step-to pattern;Decreased step length - right;Decreased step length - left;Shuffle;Antalgic;Trunk flexed Gait velocity: decr   General Gait Details: cues for posture, sequence, position from RW and increased UE WB.  Pt tolerating min wt on L LE 2*  pain   Stairs            Wheelchair Mobility    Modified Rankin (Stroke Patients Only)       Balance                                    Cognition Arousal/Alertness: Awake/alert Behavior During Therapy: WFL for tasks assessed/performed Overall Cognitive Status: Within Functional Limits for tasks assessed                      Exercises      General Comments        Pertinent Vitals/Pain Pain Assessment: 0-10 Pain Score: 10-Worst pain ever Pain Location: back and L knee Pain Descriptors / Indicators: Aching;Spasm;Throbbing Pain Intervention(s): Limited activity within patient's tolerance;Monitored during session;Premedicated before session;Patient requesting pain meds-RN notified;RN gave pain meds during session;Ice applied    Home Living Family/patient expects to be discharged to:: Private residence Living Arrangements: Alone Available Help at Discharge: Other (Comment) Type of Home: House Home Access: Stairs to enter Entrance Stairs-Rails: None Home Layout: One level Home Equipment: Environmental consultant - 2 wheels Additional Comments: Pt states she lives alone but EX-husband can provide intermittant assist    Prior Function Level of Independence: Independent          PT Goals (current goals can now be found in the care plan section) Acute Rehab PT Goals Patient Stated Goal: Get over this and back to knee rehab PT Goal Formulation: With patient Time For Goal Achievement: 03/02/14  Potential to Achieve Goals: Good Progress towards PT goals: Progressing toward goals    Frequency  7X/week    PT Plan Current plan remains appropriate    Co-evaluation             End of Session Equipment Utilized During Treatment: Gait belt;Left knee immobilizer Activity Tolerance: Patient limited by pain Patient left: in bed;with call bell/phone within reach     Time: 0951-1010 PT Time Calculation (min) (ACUTE ONLY): 19 min  Charges:  $Gait  Training: 8-22 mins $Therapeutic Activity: 8-22 mins                    G Codes:      [name] [name] 03-10-14, 11:51 AM

## 2014-02-23 NOTE — Evaluation (Signed)
Physical Therapy Evaluation Patient Details Name: [name] [name] MRN: [id number] DOB: 1934-02-08 Today's Date: 02/23/2014   History of Present Illness  Pt s/p L TKR 1/16.  Pt states she was doing well and performing squats at home, heard a pop and fell to floor  Clinical Impression  Pt s/p L quad tendon repair presents with decreased functional mobility 2* post op pain and NO ROM L knee.  Pt currently requiring mod assist for very limited mobility.  Dependent on acute stay progress, pt may require follow up rehab at SNF level prior to return home 2* restricted mobility and lack of assist at home.    Follow Up Recommendations Home health PT;SNF (dependent on acute stay progress)    Equipment Recommendations  None recommended by PT    Recommendations for Other Services OT consult     Precautions / Restrictions Precautions Precautions: Fall;Knee Precaution Comments: NO ROM at L knee, KI all times Required Braces or Orthoses: Knee Immobilizer - Left Knee Immobilizer - Left: On at all times Restrictions Weight Bearing Restrictions: No LLE Weight Bearing: Weight bearing as tolerated Other Position/Activity Restrictions: WBAT with KI       Mobility  Bed Mobility Overal bed mobility: Needs Assistance Bed Mobility: Supine to Sit     Supine to sit: Mod assist     General bed mobility comments: cues for sequence and use of R LE to self assist  Transfers Overall transfer level: Needs assistance Equipment used: Rolling walker (2 wheeled) Transfers: Sit to/from [facility] Sit to Stand: Mod assist;+2 physical assistance;+2 safety/equipment Stand pivot transfers: Mod assist;+2 physical assistance;+2 safety/equipment       General transfer comment: cues for LE management and use of UEs to self assist.  Physical assist to manage L LE ant to bring weight up and fwd.  Stand/pvt transfer bed to [facility] with RW  Ambulation/Gait Ambulation/Gait assistance: Mod assist;+2  safety/equipment Ambulation Distance (Feet): 3 Feet Assistive device: Rolling walker (2 wheeled) Gait Pattern/deviations: Step-to pattern;Decreased step length - right;Decreased step length - left;Shuffle;Trunk flexed;Antalgic Gait velocity: decr   General Gait Details: cues for posture, sequence, position from RW and increased UE WB.  Pt tolerating min wt on L LE 2* pain  Stairs            Wheelchair Mobility    Modified Rankin (Stroke Patients Only)       Balance                                             Pertinent Vitals/Pain Pain Assessment: 0-10 Pain Score: 9  Pain Location: L knee Pain Descriptors / Indicators: Aching;Throbbing Pain Intervention(s): Limited activity within patient's tolerance;Monitored during session;Patient requesting pain meds-RN notified;Ice applied    Home Living Family/patient expects to be discharged to:: Private residence Living Arrangements: Alone Available Help at Discharge: Other (Comment) Type of Home: House Home Access: Stairs to enter Entrance Stairs-Rails: None Entrance Stairs-Number of Steps: 1 Home Layout: One level Home Equipment: Walker - 2 wheels Additional Comments: Pt states she lives alone but EX-husband can provide intermittant assist    Prior Function Level of Independence: Independent               Hand Dominance        Extremity/Trunk Assessment   Upper Extremity Assessment: Overall WFL for tasks assessed  Lower Extremity Assessment: LLE deficits/detail   LLE Deficits / Details: NO ROM L knee - KI in place  Cervical / Trunk Assessment: Normal  Communication   Communication: No difficulties  Cognition Arousal/Alertness: Awake/alert Behavior During Therapy: WFL for tasks assessed/performed Overall Cognitive Status: Within Functional Limits for tasks assessed                      General Comments      Exercises        Assessment/Plan    PT  Assessment Patient needs continued PT services  PT Diagnosis Difficulty walking   PT Problem List Decreased strength;Decreased range of motion;Decreased activity tolerance;Decreased mobility;Decreased knowledge of use of DME;Pain;Decreased knowledge of precautions  PT Treatment Interventions DME instruction;Gait training;Stair training;Functional mobility training;Therapeutic activities;Therapeutic exercise;Patient/family education   PT Goals (Current goals can be found in the Care Plan section) Acute Rehab PT Goals Patient Stated Goal: Get over this and back to knee rehab PT Goal Formulation: With patient Time For Goal Achievement: 03/02/14 Potential to Achieve Goals: Good    Frequency 7X/week   Barriers to discharge Decreased caregiver support Pt lives alone with intermittant assist of EX-husband    Co-evaluation               End of Session Equipment Utilized During Treatment: Gait belt;Left knee immobilizer Activity Tolerance: Patient limited by pain Patient left: in chair;with call bell/phone within reach Nurse Communication: Mobility status         Time: 7169-6789 PT Time Calculation (min) (ACUTE ONLY): 36 min   Charges:   PT Evaluation $Initial PT Evaluation Tier I: 1 Procedure PT Treatments $Gait Training: 8-22 mins   PT G Codes:        [name] [name] 03-11-2014, 11:41 AM

## 2014-02-24 ENCOUNTER — Encounter (HOSPITAL_COMMUNITY): Payer: Self-pay | Admitting: Orthopedic Surgery

## 2014-02-24 NOTE — Progress Notes (Signed)
Clinical Social Work Department BRIEF PSYCHOSOCIAL ASSESSMENT 02/24/2014  Patient:  [name] [name], [name] [name]     Account Number:  [id number]     Admit date:  02/21/2014  Clinical Social Worker:  Maryln Manuel  Date/Time:  02/24/2014 02:30 PM  Referred by:  Physician  Date Referred:  02/24/2014 Referred for  SNF Placement   Other Referral:   Interview type:  Patient Other interview type:    PSYCHOSOCIAL DATA Living Status:  ALONE Admitted from facility:   Level of care:   Primary support name:  Sigrid Schwebach Primary support relationship to patient:  CHILD, ADULT Degree of support available:   adequate    CURRENT CONCERNS Current Concerns  Post-Acute Placement   Other Concerns:    SOCIAL WORK ASSESSMENT / PLAN CSW received referral for New SNF.    CSW met with pt at bedside. CSW introduced self and explained role. CSW inquired with pt re: living situation. Pt stated that she lives alone and does not have anyone to assist with 24 hour care. CSW discussed with pt recommendation from PT for short term rehab at [facility]. Pt stated that the Dr. mentioned that she would not need rehab at [facility]. CSW expressed understanding, but discussed that physical therapy has in an updated note which recommends rehab at SNF and this will likely change MD recommendation as well. Pt expressed that she lives alone and was concerned about how she was going to manage her care at home. Pt shared that her daughter was also "freaking out" because she was anxious about pt getting home and not being able to care for herself. CSW discussed short term rehab with pt. Pt states that she is familiar with short term rehab as she has been to [facility] twice in the past. Pt shared that her last experience at [facility] was not a good experience and pt wants to explore other options. CSW discussed that CSW can do a search in [place] in order to have options available to pt. Pt agreeable.    CSW  completed FL2 and initiated SNF search to [facility] stating that preference of pt is for private room. CSW excluded [facility] as pt expressed that she did not want to return to facility.    CSW to follow up with pt re: SNF bed offers.    CSW to continue to follow to provide support and assist with pt discharge planning needs.   Assessment/plan status:  Psychosocial Support/Ongoing Assessment of Needs Other assessment/ plan:   discharge planning   Information/referral to community resources:   [facility] list    PATIENT'S/FAMILY'S RESPONSE TO PLAN OF CARE: Pt alert and oriented x 4. Pt was hopeful to return home, but expressed that she is not progressing as well as she thought she would. Pt concerned that the MD does not feel that she needs short term rehab, but CSW explained therapy recommendations and the safety concern of pt returning home without assistance. Pt expressed that she realizes she will need short term rehab in order to have safe d/c home eventually.    Alison Murray, MSW, LCSW Clinical Social Work Coverage for [employer], [place]

## 2014-02-24 NOTE — Progress Notes (Signed)
Clinical Social Work Department CLINICAL SOCIAL WORK PLACEMENT NOTE 02/24/2014  Patient:  KORINNA, TAT  Account Number:  [id number] Admit date:  02/21/2014  Clinical Social Worker:  Maryln Manuel  Date/time:  02/24/2014 02:30 PM  Clinical Social Work is seeking post-discharge placement for this patient at the following level of care:   SKILLED NURSING   (*CSW will update this form in Epic as items are completed)   02/24/2014  Patient/family provided with [facility] Department of Clinical Social Work's list of facilities offering this level of care within the geographic area requested by the patient (or if unable, by the patient's family).  02/24/2014  Patient/family informed of their freedom to choose among providers that offer the needed level of care, that participate in Medicare, Medicaid or managed care program needed by the patient, have an available bed and are willing to accept the patient.  02/24/2014  Patient/family informed of [facility]' ownership interest in [facility], as well as of the fact that they are under no obligation to receive care at this facility.  PASARR submitted to EDS on 02/24/2014 PASARR number received on 02/24/2014  FL2 transmitted to all facilities in geographic area requested by pt/family on  02/24/2014 FL2 transmitted to all facilities within larger geographic area on   Patient informed that his/her managed care company has contracts with or will negotiate with  certain facilities, including the following:     Patient/family informed of bed offers received:   Patient chooses bed at  Physician recommends and patient chooses bed at    Patient to be transferred to  on   Patient to be transferred to facility by  Patient and family notified of transfer on  Name of family member notified:    The following physician request were entered in Epic:   Additional Comments:   Alison Murray, MSW, LCSW Clinical Social  Work Coverage for [employer], [place]

## 2014-02-24 NOTE — Progress Notes (Signed)
Subjective: 2 Days Post-Op Procedure(s) (LRB): PATELLA TENDON REPAIR (Left) Patient reports pain as moderate.   Plan is to go Home after hospital stay.  Objective: Vital signs in last 24 hours: Temp:  [98 F (36.7 C)-100 F (37.8 C)] 98 F (36.7 C) (02/08 0600) Pulse Rate:  [88-96] 96 (02/08 0600) Resp:  [16-24] 24 (02/08 0600) BP: (106-124)/(44-51) 106/44 mmHg (02/08 0600) SpO2:  [89 %-100 %] 90 % (02/08 0600)  Intake/Output from previous day:  Intake/Output Summary (Last 24 hours) at 02/24/14 0701 Last data filed at 02/24/14 0600  Gross per 24 hour  Intake 2712.5 ml  Output    950 ml  Net 1762.5 ml    Intake/Output this shift:    Labs:  Recent Labs  02/21/14 1211  HGB 10.7*    Recent Labs  02/21/14 1211  WBC 6.1  RBC 3.81*  HCT 33.3*  PLT 409*    Recent Labs  02/21/14 1211  NA 139  K 3.7  CL 107  CO2 23  BUN 10  CREATININE 0.78  GLUCOSE 114*  CALCIUM 8.5   No results for input(s): LABPT, INR in the last 72 hours.  EXAM General - Patient is Alert, Appropriate and Oriented Extremity - Neurologically intact Neurovascular intact Incision: dressing C/D/I No cellulitis present Compartment soft Dressing/Incision - clean, dry, no drainage Motor Function - intact, moving foot and toes well on exam.   Past Medical History  Diagnosis Date  . Painful respiration   . Personal history of unspecified circulatory disease   . Palpitations   . Other and unspecified hyperlipidemia   . Esophageal reflux   . Depressive disorder, not elsewhere classified   . Anxiety state, unspecified   . Unspecified arthropathy, multiple sites   . Diaphragmatic hernia without mention of obstruction or gangrene   . External hemorrhoids without mention of complication   . Diverticulosis of colon (without mention of hemorrhage)   . Diarrhea   . Special screening for malignant neoplasms of other sites   . Unspecified vitamin D deficiency   . Other malaise and fatigue  08-11-11    hx. "chronic fatigue syndrome"  . Disorder of bone and cartilage, unspecified   . Helicobacter pylori (H. pylori)   . Macular degeneration (senile) of retina, unspecified   . Shortness of breath 08-11-11    with exertion only at present  . Unspecified cerebral artery occlusion with cerebral infarction 08-11-11    '97/ '06( TIA)-affected lt. side, no residual  . Gallstones 08-11-11    asymptomatic at present  . Personal history of other diseases of digestive system 08-11-11    "Irittable bowel syndrome"  . Urinary incontinence 08-11-11    wears peripad daily  . Osteoarthrosis, unspecified whether generalized or localized, unspecified site 08-11-11    hx. "rhematoid arthritis", osteoarthritis, DDD, bursitis(hip)  . Lumbar spondylosis   . Complication of anesthesia     slow to wake up with surgery several years ago   . PONV (postoperative nausea and vomiting)     Assessment/Plan: 2 Days Post-Op Procedure(s) (LRB): PATELLA TENDON REPAIR (Left) Active Problems:   Patellar fracture   Patellar tendon rupture   Advance diet Up with therapy D/C IV fluids Plan for discharge tomorrow. Patient is going to be discharged home with home health. She does not need a SNF stay to get OOB and ambulate with a knee immobilizer on  DVT Prophylaxis - Plavix Weight-Bearing as tolerated to left leg. No ROM left knee  Gearlean Alf  02/24/2014, 7:01 AM

## 2014-02-24 NOTE — Progress Notes (Signed)
Physical Therapy Treatment Patient Details Name: [name] [name] MRN: [id number] DOB: Jul 03, 1934 Today's Date: 02/24/2014    History of Present Illness Pt s/p L TKR 1/16.  Pt states she was doing well and performing squats at home, heard a pop and fell to floor    PT Comments    POD # 2 pm session.  Assisted pt OOB to [facility] + 2 assist and increased time.  Amb a limited distance in hallway.  Daughter present and assisted by following with recliner.  Pt progressing poorly with issues of pain control and anxiety.  Very unsteady gait and transfers.  HIGH FALL RISK.   Pt states she lives alone and wont be able to manage self.  Pt is agreeable to [facility] SNF.  Follow Up Recommendations  SNF     Equipment Recommendations  None recommended by PT    Recommendations for Other Services       Precautions / Restrictions Precautions Precautions: Fall;Knee Precaution Comments: NO ROM at L knee, KI all times Required Braces or Orthoses: Knee Immobilizer - Left Knee Immobilizer - Left: On at all times Restrictions Weight Bearing Restrictions: No LLE Weight Bearing: Weight bearing as tolerated Other Position/Activity Restrictions: WBAT with KI     Mobility  Bed Mobility Overal bed mobility: Needs Assistance Bed Mobility: Supine to Sit;Sit to Supine     Supine to sit: Mod assist Sit to supine: Mod assist   General bed mobility comments: assist to fully support L LE and increased time to position to comfort.    Transfers Overall transfer level: Needs assistance Equipment used: Rolling walker (2 wheeled) Transfers: Sit to/from Stand Sit to Stand: Mod assist Stand pivot transfers: Mod assist;+2 physical assistance;+2 safety/equipment       General transfer comment: 50% VC's on proper tech and hand placement.  Demonstartes increased anxiety/fear of pain with all activity.  Poor safety cognition and impulsive to sit.    Ambulation/Gait Ambulation/Gait assistance: Mod assist;+2  safety/equipment Ambulation Distance (Feet): 22 Feet (one sitting rest break) Assistive device: Rolling walker (2 wheeled) Gait Pattern/deviations: Step-to pattern;Decreased stance time - left;Shuffle;Trunk flexed Gait velocity: decreased   General Gait Details: very unsteady gait with difficulty tolerating WB L LE and max c/o knee pain despite premedication.  Also demonstartes increased anxiety, choppy gait and immpulsive nature.     Stairs            Wheelchair Mobility    Modified Rankin (Stroke Patients Only)       Balance                                    Cognition Arousal/Alertness: Awake/alert Behavior During Therapy: Anxious                        Exercises      General Comments        Pertinent Vitals/Pain Pain Assessment: 0-10 Pain Score: 6  Pain Location: L knee Pain Descriptors / Indicators: Constant;Throbbing;Sore Pain Intervention(s): Monitored during session;Repositioned;Ice applied;Premedicated before session    Home Living                      Prior Function            PT Goals (current goals can now be found in the care plan section) Progress towards PT goals: Progressing toward goals  Frequency       PT Plan Current plan remains appropriate    Co-evaluation             End of Session Equipment Utilized During Treatment: Gait belt;Left knee immobilizer Activity Tolerance: Patient limited by fatigue;Patient limited by pain;Other (comment) (anxiety) Patient left: with call bell/phone within reach;with family/visitor present;in chair     Time: 1500-1530 PT Time Calculation (min) (ACUTE ONLY): 30 min  Charges:  $Gait Training: 8-22 mins $Therapeutic Activity: 8-22 mins                    G Codes:      Rica Koyanagi  PTA [facility]  Acute  Rehab Pager      [phone]

## 2014-02-24 NOTE — Progress Notes (Signed)
Physical Therapy Treatment Patient Details Name: [name] [name] MRN: [id number] DOB: September 25, 1934 Today's Date: 02/24/2014    History of Present Illness Pt s/p L TKR 1/16.  Pt states she was doing well and performing squats at home, heard a pop and fell to floor    PT Comments    POD # 2 am session.  Pt OOB in recliner via nurses.  Instructed pt on KI use "all times" and WBAT.  Assisted pt out of recliner to amb to bathroom required extensive assistance and much VC's.  Pt demon increased anxiety/impulsive and difficulty with pain control.  Amb to BR required 75% VC's on proper walker to self distance as pt repeatable advanced walker too far and 4 times let go of walker to reach for sink/doorway/toilet to steady self before she finished stepping.  Poor safety cognition distracted by her pain and poor judgement due to impulsiveness.  Pt required assist with supporting L LE with each sit to stand and stand to sit.  Pt required assist with transfers/toileting and getting back to bed. Pt states she lives alone and can not rely on her ex husband to help.  After this mornings session, pt realizes she will not be able to manage at home and is agreeable to [place] at [facility].    Follow Up Recommendations  SNF (pt not progressing)     Equipment Recommendations  None recommended by PT    Recommendations for Other Services       Precautions / Restrictions Precautions Precautions: Fall;Knee Precaution Comments: NO ROM at L knee, KI all times Required Braces or Orthoses: Knee Immobilizer - Left Knee Immobilizer - Left: On at all times Restrictions Weight Bearing Restrictions: No LLE Weight Bearing: Weight bearing as tolerated Other Position/Activity Restrictions: WBAT with KI     Mobility  Bed Mobility Overal bed mobility: Needs Assistance Bed Mobility: Sit to Supine       Sit to supine: Mod assist   General bed mobility comments: assist to fully support L LE and increased time to position  to comfort.    Transfers Overall transfer level: Needs assistance Equipment used: Rolling walker (2 wheeled) Transfers: Sit to/from Stand Sit to Stand: Mod assist         General transfer comment: 75% VC's on proper tech, hand placement and L LE placement as well as safety concerns of turn completion and proper walker to self distance.  four times pt let go of her walker with one hand to reach for doorway/sink/commode to steady self.  Instructed on safety issues.  Pt demon increased anxiety and impulsive.    Ambulation/Gait Ambulation/Gait assistance: Mod assist Ambulation Distance (Feet): 16 Feet Assistive device: Rolling walker (2 wheeled) Gait Pattern/deviations: Step-to pattern;Decreased stance time - left;Trunk flexed;Narrow base of support Gait velocity: decreased   General Gait Details: pt required 75% VC's on proper walker to self distance and L LE placement.  Pt demonstartes impaired safety cognition, increased anxiety and four times she let go of her walker with one hand to reach for sink/doorway/commode in attempt to steady self by reaching beyond her center of gravity.  HIGH FALL RISK.     Stairs            Wheelchair Mobility    Modified Rankin (Stroke Patients Only)       Balance  Cognition Arousal/Alertness: Awake/alert Behavior During Therapy: Anxious                        Exercises      General Comments        Pertinent Vitals/Pain Pain Assessment: 0-10 Pain Score: 8  Pain Location: l knee pain Pain Descriptors / Indicators: Constant;Throbbing;Sore Pain Intervention(s): Monitored during session;Premedicated before session;Repositioned;Ice applied    Home Living                      Prior Function            PT Goals (current goals can now be found in the care plan section) Progress towards PT goals: Progressing toward goals    Frequency       PT Plan Current  plan remains appropriate    Co-evaluation             End of Session Equipment Utilized During Treatment: Gait belt;Left knee immobilizer Activity Tolerance: Patient limited by fatigue;Patient limited by pain;Other (comment) (limited by anxiety) Patient left: in bed;with call bell/phone within reach     Time: 1115-1150 PT Time Calculation (min) (ACUTE ONLY): 35 min  Charges:  $Gait Training: 8-22 mins $Therapeutic Activity: 8-22 mins                    G Codes:      Rica Koyanagi  PTA [facility]  Acute  Rehab Pager      [phone]

## 2014-02-25 DIAGNOSIS — M150 Primary generalized (osteo)arthritis: Secondary | ICD-10-CM | POA: Diagnosis not present

## 2014-02-25 DIAGNOSIS — Z96652 Presence of left artificial knee joint: Secondary | ICD-10-CM | POA: Diagnosis not present

## 2014-02-25 DIAGNOSIS — R262 Difficulty in walking, not elsewhere classified: Secondary | ICD-10-CM | POA: Diagnosis not present

## 2014-02-25 DIAGNOSIS — M47896 Other spondylosis, lumbar region: Secondary | ICD-10-CM | POA: Diagnosis not present

## 2014-02-25 DIAGNOSIS — S82002D Unspecified fracture of left patella, subsequent encounter for closed fracture with routine healing: Secondary | ICD-10-CM | POA: Diagnosis not present

## 2014-02-25 DIAGNOSIS — R279 Unspecified lack of coordination: Secondary | ICD-10-CM | POA: Diagnosis not present

## 2014-02-25 DIAGNOSIS — S86812D Strain of other muscle(s) and tendon(s) at lower leg level, left leg, subsequent encounter: Secondary | ICD-10-CM | POA: Diagnosis not present

## 2014-02-25 DIAGNOSIS — F419 Anxiety disorder, unspecified: Secondary | ICD-10-CM | POA: Diagnosis not present

## 2014-02-25 DIAGNOSIS — M6281 Muscle weakness (generalized): Secondary | ICD-10-CM | POA: Diagnosis not present

## 2014-02-25 DIAGNOSIS — F339 Major depressive disorder, recurrent, unspecified: Secondary | ICD-10-CM | POA: Diagnosis not present

## 2014-02-25 MED ORDER — DOCUSATE SODIUM 100 MG PO CAPS: 100.0000 mg | ORAL_CAPSULE | Freq: Two times a day (BID) | ORAL | Status: DC

## 2014-02-25 MED ORDER — POLYETHYLENE GLYCOL 3350 17 G PO PACK: 17.0000 g | PACK | Freq: Every day | ORAL | Status: DC | PRN

## 2014-02-25 MED ORDER — BISACODYL 10 MG RE SUPP: 10.0000 mg | Freq: Every day | RECTAL | Status: DC | PRN

## 2014-02-25 MED ORDER — METHOCARBAMOL 500 MG PO TABS: 500.0000 mg | ORAL_TABLET | Freq: Four times a day (QID) | ORAL | Status: DC | PRN

## 2014-02-25 MED ORDER — CALCIUM CARBONATE ANTACID 500 MG PO CHEW: 1.0000 | CHEWABLE_TABLET | Freq: Four times a day (QID) | ORAL | Status: AC | PRN

## 2014-02-25 MED ORDER — ONDANSETRON HCL 4 MG PO TABS: 4.0000 mg | ORAL_TABLET | Freq: Four times a day (QID) | ORAL | Status: DC | PRN

## 2014-02-25 MED ORDER — METOCLOPRAMIDE HCL 5 MG PO TABS: 5.0000 mg | ORAL_TABLET | Freq: Three times a day (TID) | ORAL | Status: DC | PRN

## 2014-02-25 MED ORDER — ALUM & MAG HYDROXIDE-SIMETH 200-200-20 MG/5ML PO SUSP: 30.0000 mL | Freq: Four times a day (QID) | ORAL | Status: DC | PRN

## 2014-02-25 MED ORDER — OXYCODONE HCL 5 MG PO TABS: 5.0000 mg | ORAL_TABLET | ORAL | Status: DC | PRN

## 2014-02-25 NOTE — Care Management Note (Signed)
    Page 1 of 1   02/25/2014     1:06:31 PM CARE MANAGEMENT NOTE 02/25/2014  Patient:  [name] [name], [name] [name]   Account Number:  [id number]  Date Initiated:  02/25/2014  Documentation initiated by:  [facility]  Subjective/Objective Assessment:   [id number]:[id number] knee patella tendon rupture S/P TKA     Action/Plan:   discharge planning   Anticipated DC Date:  02/25/2014   Anticipated DC Plan:  [place]  CM consult      Choice offered to / List presented to:             Status of service:  Completed, signed off Medicare Important Message given?   (If response is "NO", the following Medicare IM given date fields will be blank) Date Medicare IM given:   Medicare IM given by:   Date Additional Medicare IM given:   Additional Medicare IM given by:    Discharge Disposition:  [place]  Per UR Regulation:    If discussed at Long Length of Stay Meetings, dates discussed:    Comments:  02/25/14 13:00 Cm notes pt to go to SNF; CSW arranging.  No other Cm needs were communicated.  Mariane Masters, BSn, Lane.

## 2014-02-25 NOTE — Discharge Summary (Signed)
Physician Discharge Summary   Patient ID: [name] [name] MRN: [id number] DOB/AGE: 78/05/1934 78 y.o.  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 Painful respiration   . Personal history of unspecified circulatory disease   . Palpitations   . Other and unspecified hyperlipidemia   . Esophageal reflux   . Depressive disorder, not elsewhere classified   . Anxiety state, unspecified   . Unspecified arthropathy, multiple sites   . Diaphragmatic hernia without mention of obstruction or gangrene   . External hemorrhoids without mention of complication   . Diverticulosis of colon (without mention of hemorrhage)   . Diarrhea   . Special screening for malignant neoplasms of other sites   . Unspecified vitamin D deficiency   . Other malaise and fatigue 08-11-11    hx. "chronic fatigue syndrome"  . Disorder of bone and cartilage, unspecified   . Helicobacter pylori (H. pylori)   . Macular degeneration (senile) of retina, unspecified   . Shortness of breath 08-11-11    with exertion only at present  . Unspecified cerebral artery occlusion with cerebral infarction 08-11-11    '97/ '06( TIA)-affected lt. side, no residual  . Gallstones 08-11-11    asymptomatic at present  . Personal history of other diseases of digestive system 08-11-11    "Irittable bowel syndrome"  . Urinary incontinence 08-11-11    wears peripad daily  . Osteoarthrosis, unspecified whether generalized or localized, unspecified site 08-11-11    hx. "rhematoid arthritis", osteoarthritis, DDD, bursitis(hip)  . Lumbar spondylosis   . Complication of anesthesia     slow to wake up with surgery several years ago   . PONV (postoperative nausea and vomiting)    Discharge Diagnoses:   Active Problems:   Patellar fracture   Patellar tendon rupture  Estimated body mass index is 34.96 kg/(m^2) as  calculated from the following:   Height as of this encounter: 5' 5"  (1.651 m).   Weight as of this encounter: 95.3 kg (210 lb 1.6 oz).  Procedure:  Procedure(s) (LRB): PATELLA TENDON REPAIR (Left)   Consults: None  HPI: Pt is a 80 y.o. female complaining of left knee pain after hearing / feeling a pop while doing rehabilitation at home for a TKA. Her TKA surgery was preformed on 01/27/2014 by Dr. Wynelle Link. She states that she was doing quite well with the knee surgery until today's incident. Since then she has had extreme pain in the left leg as well as the inable to bear weight because of pain. She was subsequently brought to the ER by EMS. X-rays in the ER revealed a displaced fracture of the inferior aspect of patella. There is displacement of inferior fracture fragment probable along retracted patellar tendon. Significant soft tissue swelling prepatellar and suprapatellar. Left knee prosthesis in anatomic alignment. No evidence of prosthesis loosening. Moderate joint effusion. Dr. Wynelle Link has reviewed the the findings will admit the patient. I have discussed the findings with the patient and she understands and knows that she will require another surgery.  Risks, benefits and expectations were discussed with the patient. Patient understand the risks, benefits and expectations and wishes to proceed with surgery.   Laboratory Data: Admission on 02/21/2014  Component Date Value Ref Range Status  . WBC 02/21/2014 6.1  4.0 - 10.5 K/uL Final  . RBC 02/21/2014 3.81* 3.87 - 5.11 MIL/uL Final  . Hemoglobin  02/21/2014 10.7* 12.0 - 15.0 g/dL Final  . HCT 02/21/2014 33.3* 36.0 - 46.0 % Final  . MCV 02/21/2014 87.4  78.0 - 100.0 fL Final  . MCH 02/21/2014 28.1  26.0 - 34.0 pg Final  . MCHC 02/21/2014 32.1  30.0 - 36.0 g/dL Final  . RDW 02/21/2014 13.7  11.5 - 15.5 % Final  . Platelets 02/21/2014 409* 150 - 400 K/uL Final  . Neutrophils Relative % 02/21/2014 68  43 - 77 % Final  . Neutro Abs  02/21/2014 4.2  1.7 - 7.7 K/uL Final  . Lymphocytes Relative 02/21/2014 20  12 - 46 % Final  . Lymphs Abs 02/21/2014 1.2  0.7 - 4.0 K/uL Final  . Monocytes Relative 02/21/2014 9  3 - 12 % Final  . Monocytes Absolute 02/21/2014 0.6  0.1 - 1.0 K/uL Final  . Eosinophils Relative 02/21/2014 2  0 - 5 % Final  . Eosinophils Absolute 02/21/2014 0.1  0.0 - 0.7 K/uL Final  . Basophils Relative 02/21/2014 1  0 - 1 % Final  . Basophils Absolute 02/21/2014 0.0  0.0 - 0.1 K/uL Final  . Sodium 02/21/2014 139  135 - 145 mmol/L Final  . Potassium 02/21/2014 3.7  3.5 - 5.1 mmol/L Final  . Chloride 02/21/2014 107  96 - 112 mmol/L Final  . CO2 02/21/2014 23  19 - 32 mmol/L Final  . Glucose, Bld 02/21/2014 114* 70 - 99 mg/dL Final  . BUN 02/21/2014 10  6 - 23 mg/dL Final  . Creatinine, Ser 02/21/2014 0.78  0.50 - 1.10 mg/dL Final  . Calcium 02/21/2014 8.5  8.4 - 10.5 mg/dL Final  . GFR calc non Af Amer 02/21/2014 79* >90 mL/min Final  . GFR calc Af Amer 02/21/2014 >90  >90 mL/min Final   Comment: (NOTE) The eGFR has been calculated using the CKD EPI equation. This calculation has not been validated in all clinical situations. eGFR's persistently <90 mL/min signify possible Chronic Kidney Disease.   . Anion gap 02/21/2014 9  5 - 15 Final  . ABO/RH(D) 02/21/2014 B POS   Final  . Antibody Screen 02/21/2014 NEG   Final  . Sample Expiration 02/21/2014 02/24/2014   Final  Office Visit on 02/17/2014  Component Date Value Ref Range Status  . WBC 02/17/2014 5.7  4.0 - 10.5 K/uL Final  . RBC 02/17/2014 4.25  3.87 - 5.11 Mil/uL Final  . Hemoglobin 02/17/2014 11.8* 12.0 - 15.0 g/dL Final  . HCT 02/17/2014 35.8* 36.0 - 46.0 % Final  . MCV 02/17/2014 84.2  78.0 - 100.0 fl Final  . MCHC 02/17/2014 33.0  30.0 - 36.0 g/dL Final  . RDW 02/17/2014 14.5  11.5 - 15.5 % Final  . Platelets 02/17/2014 537.0* 150.0 - 400.0 K/uL Final  . Neutrophils Relative % 02/17/2014 54.5  43.0 - 77.0 % Final  . Lymphocytes Relative  02/17/2014 32.5  12.0 - 46.0 % Final  . Monocytes Relative 02/17/2014 10.3  3.0 - 12.0 % Final  . Eosinophils Relative 02/17/2014 1.9  0.0 - 5.0 % Final  . Basophils Relative 02/17/2014 0.8  0.0 - 3.0 % Final  . Neutro Abs 02/17/2014 3.1  1.4 - 7.7 K/uL Final  . Lymphs Abs 02/17/2014 1.9  0.7 - 4.0 K/uL Final  . Monocytes Absolute 02/17/2014 0.6  0.1 - 1.0 K/uL Final  . Eosinophils Absolute 02/17/2014 0.1  0.0 - 0.7 K/uL Final  . Basophils Absolute 02/17/2014 0.0  0.0 - 0.1 K/uL Final  . Potassium  02/17/2014 4.3  3.5 - 5.1 mEq/L Final  Nursing Home on 02/04/2014  Component Date Value Ref Range Status  . Hemoglobin 01/31/2014 10.6* 12.0 - 16.0 g/dL Final  . HCT 01/31/2014 32* 36 - 46 % Final  . Platelets 01/31/2014 270  150 - 399 K/L Final  . WBC 01/31/2014 6.7   Final  . Glucose 01/31/2014 119   Final  . BUN 01/31/2014 9  4 - 21 mg/dL Final  . Creatinine 01/31/2014 0.6  0.5 - 1.1 mg/dL Final  . Potassium 01/31/2014 4.4  3.4 - 5.3 mmol/L Final  . Sodium 01/31/2014 134* 137 - 147 mmol/L Final  Admission on 01/27/2014, Discharged on 01/29/2014  Component Date Value Ref Range Status  . WBC 01/28/2014 8.7  4.0 - 10.5 K/uL Final  . RBC 01/28/2014 3.57* 3.87 - 5.11 MIL/uL Final  . Hemoglobin 01/28/2014 10.5* 12.0 - 15.0 g/dL Final  . HCT 01/28/2014 32.6* 36.0 - 46.0 % Final  . MCV 01/28/2014 91.3  78.0 - 100.0 fL Final  . MCH 01/28/2014 29.4  26.0 - 34.0 pg Final  . MCHC 01/28/2014 32.2  30.0 - 36.0 g/dL Final  . RDW 01/28/2014 13.9  11.5 - 15.5 % Final  . Platelets 01/28/2014 223  150 - 400 K/uL Final  . Sodium 01/28/2014 140  135 - 145 mmol/L Final   Please note change in reference range.  . Potassium 01/28/2014 4.1  3.5 - 5.1 mmol/L Final   Please note change in reference range.  . Chloride 01/28/2014 105  96 - 112 mEq/L Final  . CO2 01/28/2014 29  19 - 32 mmol/L Final  . Glucose, Bld 01/28/2014 123* 70 - 99 mg/dL Final  . BUN 01/28/2014 11  6 - 23 mg/dL Final  . Creatinine, Ser  01/28/2014 0.78  0.50 - 1.10 mg/dL Final  . Calcium 01/28/2014 8.5  8.4 - 10.5 mg/dL Final  . GFR calc non Af Amer 01/28/2014 79* >90 mL/min Final  . GFR calc Af Amer 01/28/2014 >90  >90 mL/min Final   Comment: (NOTE) The eGFR has been calculated using the CKD EPI equation. This calculation has not been validated in all clinical situations. eGFR's persistently <90 mL/min signify possible Chronic Kidney Disease.   . Anion gap 01/28/2014 6  5 - 15 Final  . WBC 01/29/2014 8.0  4.0 - 10.5 K/uL Final  . RBC 01/29/2014 3.36* 3.87 - 5.11 MIL/uL Final  . Hemoglobin 01/29/2014 9.8* 12.0 - 15.0 g/dL Final  . HCT 01/29/2014 30.1* 36.0 - 46.0 % Final  . MCV 01/29/2014 89.6  78.0 - 100.0 fL Final  . MCH 01/29/2014 29.2  26.0 - 34.0 pg Final  . MCHC 01/29/2014 32.6  30.0 - 36.0 g/dL Final  . RDW 01/29/2014 13.9  11.5 - 15.5 % Final  . Platelets 01/29/2014 188  150 - 400 K/uL Final  . Sodium 01/29/2014 139  135 - 145 mmol/L Final   Please note change in reference range.  . Potassium 01/29/2014 3.2* 3.5 - 5.1 mmol/L Final   Comment: Please note change in reference range. RESULT REPEATED AND VERIFIED DELTA CHECK NOTED   . Chloride 01/29/2014 107  96 - 112 mEq/L Final  . CO2 01/29/2014 26  19 - 32 mmol/L Final  . Glucose, Bld 01/29/2014 105* 70 - 99 mg/dL Final  . BUN 01/29/2014 9  6 - 23 mg/dL Final  . Creatinine, Ser 01/29/2014 0.62  0.50 - 1.10 mg/dL Final  . Calcium 01/29/2014 8.4  8.4 -  10.5 mg/dL Final  . GFR calc non Af Amer 01/29/2014 85* >90 mL/min Final  . GFR calc Af Amer 01/29/2014 >90  >90 mL/min Final   Comment: (NOTE) The eGFR has been calculated using the CKD EPI equation. This calculation has not been validated in all clinical situations. eGFR's persistently <90 mL/min signify possible Chronic Kidney Disease.   Georgiann Hahn gap 01/29/2014 6  5 - 15 Final  Hospital Outpatient Visit on 01/20/2014  Component Date Value Ref Range Status  . aPTT 01/20/2014 28  24 - 37 seconds Final    . WBC 01/20/2014 6.2  4.0 - 10.5 K/uL Final  . RBC 01/20/2014 4.71  3.87 - 5.11 MIL/uL Final  . Hemoglobin 01/20/2014 13.5  12.0 - 15.0 g/dL Final  . HCT 01/20/2014 43.0  36.0 - 46.0 % Final  . MCV 01/20/2014 91.3  78.0 - 100.0 fL Final  . MCH 01/20/2014 28.7  26.0 - 34.0 pg Final  . MCHC 01/20/2014 31.4  30.0 - 36.0 g/dL Final  . RDW 01/20/2014 13.9  11.5 - 15.5 % Final  . Platelets 01/20/2014 293  150 - 400 K/uL Final  . Sodium 01/20/2014 139  135 - 145 mmol/L Final   Please note change in reference range.  . Potassium 01/20/2014 4.8  3.5 - 5.1 mmol/L Final   Please note change in reference range.  . Chloride 01/20/2014 106  96 - 112 mEq/L Final  . CO2 01/20/2014 27  19 - 32 mmol/L Final  . Glucose, Bld 01/20/2014 97  70 - 99 mg/dL Final  . BUN 01/20/2014 16  6 - 23 mg/dL Final  . Creatinine, Ser 01/20/2014 1.14* 0.50 - 1.10 mg/dL Final  . Calcium 01/20/2014 9.3  8.4 - 10.5 mg/dL Final  . Total Protein 01/20/2014 7.6  6.0 - 8.3 g/dL Final  . Albumin 01/20/2014 4.7  3.5 - 5.2 g/dL Final  . AST 01/20/2014 29  0 - 37 U/L Final  . ALT 01/20/2014 17  0 - 35 U/L Final  . Alkaline Phosphatase 01/20/2014 77  39 - 117 U/L Final  . Total Bilirubin 01/20/2014 0.6  0.3 - 1.2 mg/dL Final  . GFR calc non Af Amer 01/20/2014 45* >90 mL/min Final  . GFR calc Af Amer 01/20/2014 52* >90 mL/min Final   Comment: (NOTE) The eGFR has been calculated using the CKD EPI equation. This calculation has not been validated in all clinical situations. eGFR's persistently <90 mL/min signify possible Chronic Kidney Disease.   . Anion gap 01/20/2014 6  5 - 15 Final  . Prothrombin Time 01/20/2014 13.1  11.6 - 15.2 seconds Final  . INR 01/20/2014 0.98  0.00 - 1.49 Final  . ABO/RH(D) 01/20/2014 B POS   Final  . Antibody Screen 01/20/2014 NEG   Final  . Sample Expiration 01/20/2014 01/30/2014   Final  . Color, Urine 01/20/2014 YELLOW  YELLOW Final  . APPearance 01/20/2014 CLEAR  CLEAR Final  . Specific  Gravity, Urine 01/20/2014 1.026  1.005 - 1.030 Final  . pH 01/20/2014 5.0  5.0 - 8.0 Final  . Glucose, UA 01/20/2014 NEGATIVE  NEGATIVE mg/dL Final  . Hgb urine dipstick 01/20/2014 NEGATIVE  NEGATIVE Final  . Bilirubin Urine 01/20/2014 NEGATIVE  NEGATIVE Final  . Ketones, ur 01/20/2014 NEGATIVE  NEGATIVE mg/dL Final  . Protein, ur 01/20/2014 NEGATIVE  NEGATIVE mg/dL Final  . Urobilinogen, UA 01/20/2014 0.2  0.0 - 1.0 mg/dL Final  . Nitrite 01/20/2014 NEGATIVE  NEGATIVE Final  . Leukocytes,  UA 01/20/2014 NEGATIVE  NEGATIVE Final   MICROSCOPIC NOT DONE ON URINES WITH NEGATIVE PROTEIN, BLOOD, LEUKOCYTES, NITRITE, OR GLUCOSE <1000 mg/dL.  Marland Kitchen MRSA, PCR 01/20/2014 NEGATIVE  NEGATIVE Final  . Staphylococcus aureus 01/20/2014 NEGATIVE  NEGATIVE Final   Comment:        The Xpert SA Assay (FDA approved for NASAL specimens in patients over 84 years of age), is one component of a comprehensive surveillance program.  Test performance has been validated by [employer] for patients greater than or equal to 36 year old. It is not intended to diagnose infection nor to guide or monitor treatment.      X-Rays:X-ray Lumbar Spine Ap And Lateral  02/22/2014   CLINICAL DATA:  Lumbar pain without radiation.  EXAM: LUMBAR SPINE - 2-3 VIEW  COMPARISON:  07/14/2012.  FINDINGS: Mild levoconvex curvature is seen in the lower lumbar spine. Image quality is degraded by technique. Patient is status post L4-S1 posterior lumbar interbody fusion with a grossly stable appearance. Mild anterior wedging of T11, stable. Vertebral body height is otherwise maintained. Alignment is anatomic. Endplate degenerative changes and loss of disc space height in the lower thoracic spine.  IMPRESSION: 1. Image quality is degraded by technique. No definite acute findings. 2. L4-S1 posterior lumbar interbody fusion, grossly stable. 3. Degenerative disc disease in the lower thoracic spine. 4. T11 compression fracture, stable.   Electronically  Signed   By: Lorin Picket M.D.   On: 02/22/2014 14:16   Dg Pelvis 1-2 Views  02/21/2014   CLINICAL DATA:  Fall, right elbow pain, low back pain, knee pain  EXAM: PELVIS - 1-2 VIEW  COMPARISON:  None.  FINDINGS: There is no evidence of pelvic fracture or diastasis. Degenerative changes are noted pubic symphysis. Mild degenerative changes bilateral hip joints with narrowing of superior hip joint space right greater than left. Metallic fixation material noted L4-L5 and S1 lumbosacral spine.  IMPRESSION: No acute fracture or subluxation. Mild degenerative changes. Postsurgical changes lumbosacral spine.   Electronically Signed   By: Lahoma Crocker M.D.   On: 02/21/2014 12:54   Dg Elbow Complete Right  02/21/2014   CLINICAL DATA:  Pain post fall, right elbow pain  EXAM: RIGHT ELBOW - COMPLETE 3+ VIEW  COMPARISON:  None.  FINDINGS: Four views of right elbow submitted. No acute fracture or subluxation. No radiopaque foreign body. No posterior fat pad sign.  IMPRESSION: Negative.   Electronically Signed   By: Lahoma Crocker M.D.   On: 02/21/2014 12:54   Dg Knee Complete 4 Views Left  02/21/2014   CLINICAL DATA:  Fall, left knee pain, right elbow hematoma  EXAM: LEFT KNEE - COMPLETE 4+ VIEW  COMPARISON:  None.  FINDINGS: Four views of the left knee submitted. There is displaced fracture of the inferior aspect of patella. There is displacement of inferior fracture fragment probable along retracted patellar tendon. Significant soft tissue swelling prepatellar and suprapatellar. Left knee prosthesis in anatomic alignment. No evidence of prosthesis loosening. Moderate joint effusion. Small amount of subcutaneous air prepatellar region.  IMPRESSION: There is displaced fracture of the inferior aspect of patella. There is displacement of inferior fracture fragment probable along retracted patellar tendon. Significant soft tissue swelling prepatellar and suprapatellar. Left knee prosthesis in anatomic alignment. No evidence of  prosthesis loosening. Moderate joint effusion. Small amount of subcutaneous air prepatellar region.   Electronically Signed   By: Lahoma Crocker M.D.   On: 02/21/2014 12:52    EKG: Orders placed or performed  in visit on 02/23/14  . EKG 12-Lead  . EKG 12-Lead     Hospital Course: [name] [name] is a 80 y.o. who was admitted to [facility] on 02/21/2014 for the above stated problem.  She was instructed that she would require further surgery upon admission.  She was placed at bedrest and scheduled for tendon repair procedure.  She was placed NPO and scheduled for the following morning.  They were brought to the operating room on 02/22/2014 and underwent Procedure(s): Nickerson.  Patient tolerated the procedure well and was later transferred to the recovery room and then to the orthopaedic floor for postoperative care.  They were given PO and IV analgesics for pain control following their surgery.  They were given 24 hours of postoperative antibiotics of  Anti-infectives    Start     Dose/Rate Route Frequency Ordered Stop   02/24/14 0600  ceFAZolin (ANCEF) IVPB 2 g/50 mL premix     2 g100 mL/hr over 30 Minutes Intravenous On call to O.R. 02/21/14 1542 02/22/14 0825   02/22/14 1430  ceFAZolin (ANCEF) IVPB 2 g/50 mL premix     2 g100 mL/hr over 30 Minutes Intravenous Every 6 hours 02/22/14 1123 02/23/14 0300     She was started back onto her Plavix which she was on preop.  PT and OT were ordered for mobility, gait training, and precaution education.  Discharge planning consulted to help with postop disposition and equipment needs.  It was originally felt that the patient would be able to go home with 24/7 care.   Patient had a toug night on the evening of surgery and the first postop day.  They started to get up OOB with therapy on day one.   Continued to work with therapy into day two.  She was progressing slowly and it was later determined that the patient did not have 24/7 care at home  with her.  Social worker was consulted to assist with placement of the patient into a short term rehab bed.  Dressing was changed on day two and the incision was healing well.  By day three, social worker was looking into a SNF bed. Incision was healing well, some steri-strips were reapplied with the dressing change.  Patient was seen in rounds and was ready to go to go to the SNF of choice.  Bed offers pending at the time of this summary.  Discharge to SNF - waiting on bed offers Diet - Regular diet Follow up - in 1 week, next Tuesday 2/16 Activity - WBAT, NO BENDING OR MOTION TO THE LEFT KNEE, Knee Immobilizer at all times except for wound checks and dressing changes. Disposition - Skilled nursing facility Condition Upon Discharge - Stable D/C Meds - See DC Summary      Discharge Instructions    Call MD / Call 911    Complete by:  As directed   If you experience chest pain or shortness of breath, CALL 911 and be transported to the hospital emergency room.  If you develope a fever above 101 F, pus (white drainage) or increased drainage or redness at the wound, or calf pain, call your surgeon's office.     Change dressing    Complete by:  As directed   Change dressing daily with sterile 4 x 4 inch gauze dressing and apply TED hose. Do not submerge the incision under water.     Constipation Prevention    Complete by:  As directed  Drink plenty of fluids.  Prune juice may be helpful.  You may use a stool softener, such as Colace (over the counter) 100 mg twice a day.  Use MiraLax (over the counter) for constipation as needed.     Diet general    Complete by:  As directed      Discharge instructions    Complete by:  As directed   Pick up stool softner and laxative for home use following surgery while on pain medications. Do not submerge incision under water. Please use good hand washing techniques while changing dressing each day. May shower starting three days after surgery. Please use a  clean towel to pat the incision dry following showers. Continue to use ice for pain and swelling after surgery. Do not use any lotions or creams on the incision until instructed by your surgeon.  NO BENDING OR MOTION TO THE LEFT KNEE KNEE IMMOBILIZER AT ALL TIMES May be WBAT to the left leg.     Do not sit on low chairs, stoools or toilet seats, as it may be difficult to get up from low surfaces    Complete by:  As directed      Driving restrictions    Complete by:  As directed   No driving until released by the physician.     Increase activity slowly as tolerated    Complete by:  As directed      Lifting restrictions    Complete by:  As directed   No lifting until released by the physician.     Patient may shower    Complete by:  As directed   You may shower without a dressing once there is no drainage.  Do not wash over the wound.  If drainage remains, do not shower until drainage stops.     TED hose    Complete by:  As directed   Use stockings (TED hose) for 3 weeks on both leg(s).  You may remove them at night for sleeping.     Weight bearing as tolerated    Complete by:  As directed   Laterality:  left  Extremity:  Lower            Medication List    STOP taking these medications        HYDROcodone-acetaminophen 10-325 MG per tablet  Commonly known as:  NORCO     rivaroxaban 10 MG Tabs tablet  Commonly known as:  XARELTO      TAKE these medications        acetaminophen 325 MG tablet  Commonly known as:  TYLENOL  Take 2 tablets (650 mg total) by mouth every 6 (six) hours as needed for mild pain (or Fever >/= 101).     ALPRAZolam 0.5 MG tablet  Commonly known as:  XANAX  Take 1 tablet (0.5 mg total) by mouth at bedtime as needed for sleep.     alum & mag hydroxide-simeth 200-200-20 MG/5ML suspension  Commonly known as:  MAALOX/MYLANTA  Take 30 mLs by mouth every 6 (six) hours as needed for indigestion or heartburn (dyspepsia).     atorvastatin 10 MG tablet    Commonly known as:  LIPITOR  TAKE 1 TABLET DAILY     bisacodyl 10 MG suppository  Commonly known as:  DULCOLAX  Place 1 suppository (10 mg total) rectally daily as needed for moderate constipation.     calcium carbonate 500 MG chewable tablet  Commonly known as:  TUMS - dosed  in mg elemental calcium  Chew 1 tablet (200 mg of elemental calcium total) by mouth 4 (four) times daily as needed for indigestion or heartburn.     clopidogrel 75 MG tablet  Commonly known as:  PLAVIX  Take 75 mg by mouth daily.     DSS 100 MG Caps  Take 100 mg by mouth 2 (two) times daily.     docusate sodium 100 MG capsule  Commonly known as:  COLACE  Take 1 capsule (100 mg total) by mouth 2 (two) times daily.     esomeprazole 40 MG capsule  Commonly known as:  NEXIUM  Take 40 mg by mouth 2 (two) times daily before a meal.     methocarbamol 500 MG tablet  Commonly known as:  ROBAXIN  Take 1 tablet (500 mg total) by mouth every 6 (six) hours as needed for muscle spasms.     metoCLOPramide 5 MG tablet  Commonly known as:  REGLAN  Take 1 tablet (5 mg total) by mouth every 8 (eight) hours as needed for nausea (if ondansetron (ZOFRAN) ineffective.).     ondansetron 4 MG tablet  Commonly known as:  ZOFRAN  Take 1 tablet (4 mg total) by mouth every 6 (six) hours as needed for nausea.     oxyCODONE 5 MG immediate release tablet  Commonly known as:  Oxy IR/ROXICODONE  Take 1-3 tablets (5-15 mg total) by mouth every 3 (three) hours as needed for moderate pain or severe pain.     polyethylene glycol packet  Commonly known as:  MIRALAX / GLYCOLAX  Take 17 g by mouth daily as needed for mild constipation.     sertraline 100 MG tablet  Commonly known as:  ZOLOFT  Take 100 mg by mouth 2 (two) times daily.       Follow-up Information    Follow up with [facility].   Why:  Home Health Physical Therapy   Contact information:   [place] [address] [phone]        Follow up On 03/04/2014.   Why:  Call office ASAP at [phone] to setup appointment next Tuesday 03/04/2014 with Dr. Wynelle Link.      Signed: Arlee Muslim, PA-C Orthopaedic Surgery 02/25/2014, 8:55 AM

## 2014-02-25 NOTE — Progress Notes (Signed)
Subjective: 3 Days Post-Op Procedure(s) (LRB): PATELLA TENDON REPAIR (Left) Patient reports pain as moderate.   Patient seen in rounds with Dr. Wynelle Link. Spoke with patient about living situation and progress. She now wants to and is agreeable with SNF.  If bed found, allow transfer today. Patient is well, but has had some minor complaints of pain in the knee', requiring pain medications Patient is ready to go to the SNF if arrangements are finalized.  Objective: Vital signs in last 24 hours: Temp:  [98.9 F (37.2 C)-99.6 F (37.6 C)] 98.9 F (37.2 C) (02/09 0400) Pulse Rate:  [85-96] 85 (02/09 0400) BP: (112-113)/(48-53) 113/48 mmHg (02/09 0400) SpO2:  [92 %-94 %] 92 % (02/09 0400)  Intake/Output from previous day:  Intake/Output Summary (Last 24 hours) at 02/25/14 0830 Last data filed at 02/25/14 0745  Gross per 24 hour  Intake    990 ml  Output   1425 ml  Net   -435 ml    Intake/Output this shift: Total I/O In: -  Out: 200 [Urine:200]  Labs: No results for input(s): HGB in the last 72 hours. No results for input(s): WBC, RBC, HCT, PLT in the last 72 hours. No results for input(s): NA, K, CL, CO2, BUN, CREATININE, GLUCOSE, CALCIUM in the last 72 hours. No results for input(s): LABPT, INR in the last 72 hours.  EXAM: General - Patient is Alert, Appropriate and Oriented Extremity - Neurovascular intact Sensation intact distally Incision - clean, dry, no drainage Motor Function - intact, moving foot and toes well on exam.   Assessment/Plan: 3 Days Post-Op Procedure(s) (LRB): PATELLA TENDON REPAIR (Left) Procedure(s) (LRB): PATELLA TENDON REPAIR (Left) Past Medical History  Diagnosis Date  . Painful respiration   . Personal history of unspecified circulatory disease   . Palpitations   . Other and unspecified hyperlipidemia   . Esophageal reflux   . Depressive disorder, not elsewhere classified   . Anxiety state, unspecified   . Unspecified arthropathy,  multiple sites   . Diaphragmatic hernia without mention of obstruction or gangrene   . External hemorrhoids without mention of complication   . Diverticulosis of colon (without mention of hemorrhage)   . Diarrhea   . Special screening for malignant neoplasms of other sites   . Unspecified vitamin D deficiency   . Other malaise and fatigue 08-11-11    hx. "chronic fatigue syndrome"  . Disorder of bone and cartilage, unspecified   . Helicobacter pylori (H. pylori)   . Macular degeneration (senile) of retina, unspecified   . Shortness of breath 08-11-11    with exertion only at present  . Unspecified cerebral artery occlusion with cerebral infarction 08-11-11    '97/ '06( TIA)-affected lt. side, no residual  . Gallstones 08-11-11    asymptomatic at present  . Personal history of other diseases of digestive system 08-11-11    "Irittable bowel syndrome"  . Urinary incontinence 08-11-11    wears peripad daily  . Osteoarthrosis, unspecified whether generalized or localized, unspecified site 08-11-11    hx. "rhematoid arthritis", osteoarthritis, DDD, bursitis(hip)  . Lumbar spondylosis   . Complication of anesthesia     slow to wake up with surgery several years ago   . PONV (postoperative nausea and vomiting)    Active Problems:   Patellar fracture   Patellar tendon rupture  Estimated body mass index is 34.96 kg/(m^2) as calculated from the following:   Height as of this encounter: 5\' 5"  (1.651 m).   Weight as  of this encounter: 95.3 kg (210 lb 1.6 oz). Up with therapy Discharge to SNF - waiting on bed offers Diet - Regular diet Follow up - in 1 week, next Tuesday 2/16 Activity - WBAT, NO BENDING OR MOTION TO THE LEFT KNEE, KI at all times except for wound checks and dressing changes. Disposition - Skilled nursing facility Condition Upon Discharge - Stable D/C Meds - See DC Summary  Arlee Muslim, PA-C Orthopaedic Surgery 02/25/2014, 8:30 AM

## 2014-02-25 NOTE — Progress Notes (Signed)
Physical Therapy Treatment Patient Details Name: [name] [name] MRN: [id number] DOB: 01-15-1935 Today's Date: 02/25/2014    History of Present Illness Pt s/p L TKR 1/16.  Pt states she was doing well and performing squats at home, heard a pop and fell to floor    PT Comments    Pt OOB in bathroom.  Assisted with sit to stand then amb a limited distance in hallway.  Pt progressing slowly and demon unsteady gait with limited tolerance WB thru L LE due to her pain level.  Positioned in recliner with L LE elevated and applied ICE.  Follow Up Recommendations  SNF (via car)     Equipment Recommendations       Recommendations for Other Services       Precautions / Restrictions Precautions Precautions: Fall;Knee Precaution Comments: NO ROM at L knee, KI all times Required Braces or Orthoses: Knee Immobilizer - Left Knee Immobilizer - Left: On at all times Restrictions Weight Bearing Restrictions: No LLE Weight Bearing: Weight bearing as tolerated Other Position/Activity Restrictions: WBAT with KI     Mobility  Bed Mobility               General bed mobility comments: Pt OOB in bathroom  Transfers Overall transfer level: Needs assistance Equipment used: Rolling walker (2 wheeled) Transfers: Sit to/from Stand Sit to Stand: Mod assist;Min assist         General transfer comment: 50% VC's on proper tech and hand placement. also requires increased time.  Ambulation/Gait Ambulation/Gait assistance: Min assist;Mod assist Ambulation Distance (Feet): 28 Feet Assistive device: Rolling walker (2 wheeled) Gait Pattern/deviations: Step-to pattern;Decreased stance time - left;Trunk flexed Gait velocity: decreased   General Gait Details: increased time and 25% VC's on safe walker distance and caution with turns and backward gait.  still unsteady.  HIGH FALL RISK.   Stairs            Wheelchair Mobility    Modified Rankin (Stroke Patients Only)       Balance                                     Cognition                            Exercises      General Comments        Pertinent Vitals/Pain Pain Assessment: 0-10 Pain Score: 8  Pain Location: L knee Pain Descriptors / Indicators: Constant;Sore Pain Intervention(s): Monitored during session;Premedicated before session;Repositioned;Ice applied    Home Living                      Prior Function            PT Goals (current goals can now be found in the care plan section) Progress towards PT goals: Progressing toward goals    Frequency  7X/week    PT Plan      Co-evaluation             End of Session Equipment Utilized During Treatment: Gait belt;Left knee immobilizer Activity Tolerance: Patient limited by fatigue;Patient limited by pain Patient left: in chair;with call bell/phone within reach     Time: 1055-1110 PT Time Calculation (min) (ACUTE ONLY): 15 min  Charges:  $Gait Training: 8-22 mins  G Codes:      Rica Koyanagi  PTA [facility]  Acute  Rehab Pager      [phone]

## 2014-02-25 NOTE — Progress Notes (Signed)
Clinical Social Work Department CLINICAL SOCIAL WORK PLACEMENT NOTE 02/25/2014  Patient:  [name] [name], [name] [name]  Account Number:  [id number] Admit date:  02/21/2014  Clinical Social Worker:  Werner Lean, LCSW  Date/time:  02/24/2014 02:30 PM  Clinical Social Work is seeking post-discharge placement for this patient at the following level of care:   SKILLED NURSING   (*CSW will update this form in Epic as items are completed)   02/24/2014  Patient/family provided with [place] Department of Clinical Social Work's list of facilities offering this level of care within the geographic area requested by the patient (or if unable, by the patient's family).  02/24/2014  Patient/family informed of their freedom to choose among providers that offer the needed level of care, that participate in Medicare, Medicaid or managed care program needed by the patient, have an available bed and are willing to accept the patient.  02/24/2014  Patient/family informed of [facility]' ownership interest in [facility], as well as of the fact that they are under no obligation to receive care at this facility.  PASARR submitted to EDS on 02/24/2014 PASARR number received on 02/24/2014  FL2 transmitted to all facilities in geographic area requested by pt/family on  02/24/2014 FL2 transmitted to all facilities within larger geographic area on   Patient informed that his/her managed care company has contracts with or will negotiate with  certain facilities, including the following:     Patient/family informed of bed offers received:  02/25/2014 Patient chooses bed at [facility] Physician recommends and patient chooses bed at    Patient to be transferred to [place] on  02/25/2014 Patient to be transferred to facility by [facility] Patient and family notified of transfer on 02/25/2014 Name of family member notified:  SPOUSE  The following physician  request were entered in Epic:   Additional Comments: Pt / spouse are in agreement with d/c to SNF today. PT approved transport by car. NSG reviewed d/c summary, scripts, avs. Scripts included in d/c packet. D/c packet provided to pt prior to d/c.  Werner Lean LCSW [phone]

## 2014-02-25 NOTE — Discharge Instructions (Signed)
Patellar Tendon Tear / Disruption with Rehab A patellar tendon tear or disruption is a complete tear of the tendon below the kneecap. The patellar tendon attaches the thigh muscle (quadriceps) to the shinbone (tibia). These muscles are responsible for bending the knee and flexing the hip. A tear in the patellar tendon results in a disability to perform these actions. SYMPTOMS   A "pop" or tear felt in the knee or under the kneecap, at the time of injury.  Pain, tenderness, swelling, warmth, or redness over and around the patellar tendon.  Pain that gets worse when trying to forcefully straighten the knee or bend the knee.  Inability to straighten the knee when seated.  Crackling sound (crepitation) when the tendon is moved or touched.  Bruising (contusion) around the knee within 48 hours of injury.  Loss of firm fullness when pushing on the area where the tendon ruptured (a defect between the ends of the tendon where they separated from each other). CAUSES  The patellar tendon tears when a force is placed on it that is greater than it can handle. Common causes of injury include:  A stressful incident, such as with jumping, hurdling, or starting a sprint.  Direct hit (trauma) to the knee. RISK INCREASES WITH:  Sports that require sudden, explosive muscle contraction, such as those involving jumping or quick starts.  Running or contact sports.  Poor strength and flexibility.  Previous patellar tendon injury.  Untreated patellar tendinitis.  Corticosteroid injection into the patellar tendon. (Corticosteroid injections weaken tendons.) PREVENTION  Warm up and stretch properly before activity.  Allow for adequate recovery between workouts.  Maintain physical fitness:  Strength, flexibility, and endurance.  Cardiovascular fitness.  Protect the knee with taping, protective strapping, or elastic compression bandage during activity. PROGNOSIS  If treated properly, patellar  tendon tears usually heal, with a return to sports within 6 to 9 months after injury.  RELATED COMPLICATIONS   Weakness of the thigh (quadriceps) muscles, especially if the tear is left untreated.  Re-rupture of the tendon after treatment.  Prolonged disability.  Risks of surgery: infection, injury to nerves (numbness, weakness, or paralysis), bleeding, knee stiffness, knee weakness, pain when sitting for long periods, pain when getting up from a seated position and when kneeling or squatting, pain going up or down stairs or hills, and knee giving way or buckling. TREATMENT  Treatment first involves resting from any activities that aggravate the symptoms. The use of ice and medicine will help reduce pain and inflammation. Applying a compression bandage and elevating the knee above the level of the heart will also help reduce inflammation. Definitive treatment for patellar tendon tears is surgery, because contraction of the quadriceps tendon prevents healing of the tendon. Surgery often involves using stitches (sutures) to sew the ends of the tendon back together. Surgery is followed by restraint of the knee, to allow for healing. After restraint, it is important to perform strengthening and stretching exercises to help regain strength and a full range of motion. These exercises may be completed at home or with a therapist.  MEDICATION   If pain medicine is needed, nonsteroidal anti-inflammatory medicines (aspirin and ibuprofen), or other minor pain relievers (acetaminophen), are often advised.  Do not take pain medicine for 7 days before surgery.  Prescription pain relievers may be given, if your caregiver thinks they are needed. Use only as directed and only as much as you need. COLD THERAPY  Cold treatment (icing) should be applied for 10 to 15  minutes every 2 to 3 hours for inflammation and pain, and immediately after activity that aggravates your symptoms. Use ice packs or an ice  massage. SEEK MEDICAL CARE IF:  Pain increases, despite treatment.  Cast discomfort develops.  Any of the following occur after surgery: signs of infection, including fever, increased pain, swelling, redness, drainage of fluids, or bleeding in the affected area.  New, unexplained symptoms develop. (Drugs used in treatment may produce side effects.)  Pick up stool softner and laxative for home use following surgery while on pain medications. Do not submerge incision under water. Please use good hand washing techniques while changing dressing each day. May shower starting three days after surgery. Please use a clean towel to pat the incision dry following showers. Continue to use ice for pain and swelling after surgery. Do not use any lotions or creams on the incision until instructed by your surgeon.  NO BENDING OR MOTION TO THE LEFT KNEE KNEE IMMOBILIZER AT ALL TIMES May be WBAT to the left leg.

## 2014-02-26 ENCOUNTER — Other Ambulatory Visit: Payer: Self-pay | Admitting: Family Medicine

## 2014-02-26 NOTE — Telephone Encounter (Signed)
Request for refill. Last refill on 12/16/13. CPE appt on 04/2014.

## 2014-02-26 NOTE — Telephone Encounter (Signed)
Please refill for a year  

## 2014-03-04 DIAGNOSIS — S86812D Strain of other muscle(s) and tendon(s) at lower leg level, left leg, subsequent encounter: Secondary | ICD-10-CM | POA: Diagnosis not present

## 2014-03-04 DIAGNOSIS — Z96652 Presence of left artificial knee joint: Secondary | ICD-10-CM | POA: Diagnosis not present

## 2014-03-12 DIAGNOSIS — H3530 Unspecified macular degeneration: Secondary | ICD-10-CM | POA: Diagnosis not present

## 2014-03-12 DIAGNOSIS — S76112D Strain of left quadriceps muscle, fascia and tendon, subsequent encounter: Secondary | ICD-10-CM | POA: Diagnosis not present

## 2014-03-12 DIAGNOSIS — M1990 Unspecified osteoarthritis, unspecified site: Secondary | ICD-10-CM | POA: Diagnosis not present

## 2014-03-12 DIAGNOSIS — K219 Gastro-esophageal reflux disease without esophagitis: Secondary | ICD-10-CM | POA: Diagnosis not present

## 2014-03-12 DIAGNOSIS — Z96652 Presence of left artificial knee joint: Secondary | ICD-10-CM | POA: Diagnosis not present

## 2014-03-14 DIAGNOSIS — Z96652 Presence of left artificial knee joint: Secondary | ICD-10-CM | POA: Diagnosis not present

## 2014-03-14 DIAGNOSIS — S76112D Strain of left quadriceps muscle, fascia and tendon, subsequent encounter: Secondary | ICD-10-CM | POA: Diagnosis not present

## 2014-03-14 DIAGNOSIS — K219 Gastro-esophageal reflux disease without esophagitis: Secondary | ICD-10-CM | POA: Diagnosis not present

## 2014-03-14 DIAGNOSIS — H3530 Unspecified macular degeneration: Secondary | ICD-10-CM | POA: Diagnosis not present

## 2014-03-14 DIAGNOSIS — M1990 Unspecified osteoarthritis, unspecified site: Secondary | ICD-10-CM | POA: Diagnosis not present

## 2014-03-17 ENCOUNTER — Other Ambulatory Visit: Payer: Self-pay | Admitting: Family Medicine

## 2014-03-18 DIAGNOSIS — Z96652 Presence of left artificial knee joint: Secondary | ICD-10-CM | POA: Diagnosis not present

## 2014-03-18 DIAGNOSIS — H3530 Unspecified macular degeneration: Secondary | ICD-10-CM | POA: Diagnosis not present

## 2014-03-18 DIAGNOSIS — M1990 Unspecified osteoarthritis, unspecified site: Secondary | ICD-10-CM | POA: Diagnosis not present

## 2014-03-18 DIAGNOSIS — K219 Gastro-esophageal reflux disease without esophagitis: Secondary | ICD-10-CM | POA: Diagnosis not present

## 2014-03-18 DIAGNOSIS — S76112D Strain of left quadriceps muscle, fascia and tendon, subsequent encounter: Secondary | ICD-10-CM | POA: Diagnosis not present

## 2014-03-20 DIAGNOSIS — M1990 Unspecified osteoarthritis, unspecified site: Secondary | ICD-10-CM | POA: Diagnosis not present

## 2014-03-20 DIAGNOSIS — H3530 Unspecified macular degeneration: Secondary | ICD-10-CM | POA: Diagnosis not present

## 2014-03-20 DIAGNOSIS — Z96652 Presence of left artificial knee joint: Secondary | ICD-10-CM | POA: Diagnosis not present

## 2014-03-20 DIAGNOSIS — S76112D Strain of left quadriceps muscle, fascia and tendon, subsequent encounter: Secondary | ICD-10-CM | POA: Diagnosis not present

## 2014-03-20 DIAGNOSIS — K219 Gastro-esophageal reflux disease without esophagitis: Secondary | ICD-10-CM | POA: Diagnosis not present

## 2014-03-24 ENCOUNTER — Telehealth: Payer: Self-pay | Admitting: Family Medicine

## 2014-03-24 DIAGNOSIS — K219 Gastro-esophageal reflux disease without esophagitis: Secondary | ICD-10-CM

## 2014-03-24 DIAGNOSIS — R1310 Dysphagia, unspecified: Secondary | ICD-10-CM

## 2014-03-24 NOTE — Telephone Encounter (Signed)
Let her know please I did the referral so she will get a call

## 2014-03-24 NOTE — Telephone Encounter (Signed)
Patient aware of GI referral.

## 2014-03-24 NOTE — Telephone Encounter (Signed)
Pt called stating since she was here 02/17/14.  She had another surgery.  She said her swallowing is getting worse and would like to see a GI asap.

## 2014-03-25 DIAGNOSIS — S86812D Strain of other muscle(s) and tendon(s) at lower leg level, left leg, subsequent encounter: Secondary | ICD-10-CM | POA: Diagnosis not present

## 2014-03-25 DIAGNOSIS — Z96652 Presence of left artificial knee joint: Secondary | ICD-10-CM | POA: Diagnosis not present

## 2014-03-25 DIAGNOSIS — Z471 Aftercare following joint replacement surgery: Secondary | ICD-10-CM | POA: Diagnosis not present

## 2014-03-26 DIAGNOSIS — K219 Gastro-esophageal reflux disease without esophagitis: Secondary | ICD-10-CM | POA: Diagnosis not present

## 2014-03-26 DIAGNOSIS — H3530 Unspecified macular degeneration: Secondary | ICD-10-CM | POA: Diagnosis not present

## 2014-03-26 DIAGNOSIS — M1990 Unspecified osteoarthritis, unspecified site: Secondary | ICD-10-CM | POA: Diagnosis not present

## 2014-03-26 DIAGNOSIS — S76112D Strain of left quadriceps muscle, fascia and tendon, subsequent encounter: Secondary | ICD-10-CM | POA: Diagnosis not present

## 2014-03-26 DIAGNOSIS — Z96652 Presence of left artificial knee joint: Secondary | ICD-10-CM | POA: Diagnosis not present

## 2014-03-27 ENCOUNTER — Other Ambulatory Visit: Payer: Self-pay | Admitting: Family Medicine

## 2014-03-27 ENCOUNTER — Encounter: Payer: Self-pay | Admitting: Gastroenterology

## 2014-03-27 ENCOUNTER — Ambulatory Visit (INDEPENDENT_AMBULATORY_CARE_PROVIDER_SITE_OTHER): Payer: Medicare Other | Admitting: Gastroenterology

## 2014-03-27 ENCOUNTER — Telehealth: Payer: Self-pay

## 2014-03-27 VITALS — BP 100/60 | HR 96 | Ht 65.0 in | Wt 186.5 lb

## 2014-03-27 DIAGNOSIS — R1314 Dysphagia, pharyngoesophageal phase: Secondary | ICD-10-CM | POA: Diagnosis not present

## 2014-03-27 DIAGNOSIS — Q394 Esophageal web: Secondary | ICD-10-CM | POA: Diagnosis not present

## 2014-03-27 DIAGNOSIS — K222 Esophageal obstruction: Secondary | ICD-10-CM | POA: Insufficient documentation

## 2014-03-27 NOTE — Telephone Encounter (Signed)
Yes, that is ok 

## 2014-03-27 NOTE — Telephone Encounter (Signed)
Already left a message on the triage line at Dr. Marliss Coots office. Will send phone note also to Dr. Glori Bickers.    03/27/2014   RE: [name] [name] DOB: 12/24/1934 MRN: [id number]   Dear Dr. Glori Bickers,    We have scheduled the above patient for an endoscopic procedure. Our records show that she is on anticoagulation therapy.   Please advise if patient can come off Plavix for 5 days prior to the procedure, which is scheduled for 04/02/14.  Please route the answer to Marlon Pel, [place]. Sincerely,    Marlon Pel, CMA

## 2014-03-27 NOTE — Telephone Encounter (Signed)
[name] [name] with [facility] left v/m requesting cb; pt is scheduled for endoscopy with dilation on 04/01/13; can pt stop plavix 5 days prior to procedure.  [name] [name] needs cb today with answer.

## 2014-03-27 NOTE — Progress Notes (Signed)
i agree with the above note, plan 

## 2014-03-27 NOTE — Progress Notes (Signed)
     03/27/2014 [name] [name] [id number] 1934-08-03   History of Present Illness:  This is a pleasant 80 year old female who is previously known to Dr. Ardis Hughs.  She had an EGD in 11/2011 at which time she was found to have a thin Schatzki's ring that was dilated.  She also had a 4-5 cm hiatal hernia with a distally tortuous esophagus.  She presents to our office today with recurrent complaints of dysphagia.  Since January she has been having issues with food and pills getting stuck.  Even liquids feel like they get hung up or are slow to go down at times.  She is on Nexium 40 mg BID and has been taking that for quite some time.    She is on Plavix that is prescribed by Dr. Glori Bickers, her PCP.  She held the Plavix in January for knee surgery without any issues.       Current Medications, Allergies, Past Medical History, Past Surgical History, Family History and Social History were reviewed in [employer] record.   Physical Exam: BP 100/60 mmHg  Pulse 96  Ht 5\' 5"  (1.651 m)  Wt 186 lb 8 oz (84.596 kg)  BMI 31.04 kg/m2 General: Well developed white female in no acute distress Head: Normocephalic and atraumatic Eyes:  Sclerae anicteric, conjunctiva pink  Ears: Normal auditory acuity Lungs: Clear throughout to auscultation Heart: Regular rate and rhythm Abdomen: Soft, non-distended.  Normal bowel sounds.  Non-tender. Musculoskeletal: Symmetrical with no gross deformities; has knee brace on left leg  Extremities: No edema  Neurological: Alert oriented x 4, grossly non-focal Psychological:  Alert and cooperative. Normal mood and affect  Assessment and Recommendations: -Dysphagia with history of Schatzki's ring requiring dilation in 11/2011.  Also had tortuous esophagus.  Now with recurrent dysphagia to food and pills mostly.  Will schedule for EGD with dilation with Dr. Ardis Hughs.  The risks, benefits, and alternatives were discussed with the patient and she consents to  proceed. -Chronic anti-platelet therapy with Plavix:  Hold Plavix for 5 days before procedure - will instruct when and how to resume after procedure. Risks and benefits of procedure including bleeding, perforation, infection, missed lesions, medication reactions and possible hospitalization or surgery if complications occur explained. Additional rare but real risk of cardiovascular event such as heart attack or ischemia/infarct of other organs off of Plavix explained and need to seek urgent help if this occurs. Will communicate by phone or EMR with patient's prescribing provider, Dr. Glori Bickers, to confirm that holding Plavix is reasonable in this case.    CC:  Dr. Loura Pardon

## 2014-03-27 NOTE — Telephone Encounter (Signed)
See phone note from Cardiology from today. Pt verbalized understanding to hold Plavix 5 days prior to procedure per Dr. Glori Bickers.

## 2014-03-27 NOTE — Patient Instructions (Signed)
You have been scheduled for an endoscopy. Please follow written instructions given to you at your visit today. If you use inhalers (even only as needed), please bring them with you on the day of your procedure.   

## 2014-03-28 DIAGNOSIS — K219 Gastro-esophageal reflux disease without esophagitis: Secondary | ICD-10-CM | POA: Diagnosis not present

## 2014-03-28 DIAGNOSIS — S76112D Strain of left quadriceps muscle, fascia and tendon, subsequent encounter: Secondary | ICD-10-CM | POA: Diagnosis not present

## 2014-03-28 DIAGNOSIS — Z96652 Presence of left artificial knee joint: Secondary | ICD-10-CM | POA: Diagnosis not present

## 2014-03-28 DIAGNOSIS — M1990 Unspecified osteoarthritis, unspecified site: Secondary | ICD-10-CM | POA: Diagnosis not present

## 2014-03-28 DIAGNOSIS — H3530 Unspecified macular degeneration: Secondary | ICD-10-CM | POA: Diagnosis not present

## 2014-04-01 DIAGNOSIS — Z96652 Presence of left artificial knee joint: Secondary | ICD-10-CM | POA: Diagnosis not present

## 2014-04-01 DIAGNOSIS — H3530 Unspecified macular degeneration: Secondary | ICD-10-CM | POA: Diagnosis not present

## 2014-04-01 DIAGNOSIS — K219 Gastro-esophageal reflux disease without esophagitis: Secondary | ICD-10-CM | POA: Diagnosis not present

## 2014-04-01 DIAGNOSIS — M1990 Unspecified osteoarthritis, unspecified site: Secondary | ICD-10-CM | POA: Diagnosis not present

## 2014-04-01 DIAGNOSIS — S76112D Strain of left quadriceps muscle, fascia and tendon, subsequent encounter: Secondary | ICD-10-CM | POA: Diagnosis not present

## 2014-04-02 ENCOUNTER — Encounter: Payer: Self-pay | Admitting: Gastroenterology

## 2014-04-02 ENCOUNTER — Ambulatory Visit (AMBULATORY_SURGERY_CENTER): Payer: Medicare Other | Admitting: Gastroenterology

## 2014-04-02 ENCOUNTER — Other Ambulatory Visit: Payer: Self-pay | Admitting: Family Medicine

## 2014-04-02 VITALS — BP 141/72 | HR 76 | Temp 98.5°F | Resp 26 | Ht 65.0 in | Wt 186.0 lb

## 2014-04-02 DIAGNOSIS — K449 Diaphragmatic hernia without obstruction or gangrene: Secondary | ICD-10-CM

## 2014-04-02 DIAGNOSIS — Z8673 Personal history of transient ischemic attack (TIA), and cerebral infarction without residual deficits: Secondary | ICD-10-CM | POA: Diagnosis not present

## 2014-04-02 DIAGNOSIS — R1314 Dysphagia, pharyngoesophageal phase: Secondary | ICD-10-CM | POA: Diagnosis not present

## 2014-04-02 DIAGNOSIS — K222 Esophageal obstruction: Secondary | ICD-10-CM

## 2014-04-02 DIAGNOSIS — Q394 Esophageal web: Secondary | ICD-10-CM

## 2014-04-02 DIAGNOSIS — R1319 Other dysphagia: Secondary | ICD-10-CM

## 2014-04-02 DIAGNOSIS — R1310 Dysphagia, unspecified: Secondary | ICD-10-CM

## 2014-04-02 MED ORDER — SODIUM CHLORIDE 0.9 % IV SOLN
500.0000 mL | INTRAVENOUS | Status: DC
Start: 2014-04-02 — End: 2014-04-02

## 2014-04-02 NOTE — Op Note (Signed)
[place]  [address] [place], [id number]   ENDOSCOPY PROCEDURE REPORT  PATIENT: [name], [name]  MR#: [id number] BIRTHDATE: [birth year] , 79  yrs. old GENDER: female ENDOSCOPIST: Milus Banister, MD PROCEDURE DATE:  04/02/2014 PROCEDURE:  EGD w/ balloon dilation ASA CLASS:     Class III INDICATIONS:  dysphagia since orthopedic procedure 2 months ago; h/o Schatzki's ring dilated 2-3 years ago. MEDICATIONS: Monitored anesthesia care and Propofol 160 mg IV TOPICAL ANESTHETIC: none  DESCRIPTION OF PROCEDURE: After the risks benefits and alternatives of the procedure were thoroughly explained, informed consent was obtained.  The [facility] [id number] endoscope was introduced through the mouth and advanced to the second portion of the duodenum , Without limitations.  The instrument was slowly withdrawn as the mucosa was fully examined.  Pictures were taken but not saved due to technical issue with processor.  There was a 4cm hiatal hernia causing typical resultant esophageal tortuousity, especially distally.  There was a Schatzki's ring at the GE junction and this was dilated with CRE TTS balloon held inflated for 60 seconds.  There was typical superficial mucosal tear and minor, self limited oozing following dilation.  The examination was otherwise normal.  Retroflexed views revealed no abnormalities.     The scope was then withdrawn from the patient and the procedure completed.  COMPLICATIONS: There were no immediate complications.  ENDOSCOPIC IMPRESSION: There was a 4cm hiatal hernia causing typical resultant esophageal tortuousity, especially distally.  There was a Schatzki's ring at the GE junction and this was dilated with CRE TTS balloon held inflated for 60 seconds.  There was typical superficial mucosal tear and minor, self limited oozing following dilation.  The examination was otherwise normal  RECOMMENDATIONS: Continue daily antiacid  medicine.  Continue to chew your food well, eat slowly and take small bites.  Please call Dr.  Ardis Hughs' office in 4-6 weeks to report on your response to this dilation.   eSigned:  Milus Banister, MD 04/02/2014 9:08 AM    CC: Loura Pardon, MD

## 2014-04-02 NOTE — Patient Instructions (Signed)
YOU HAD AN ENDOSCOPIC PROCEDURE TODAY AT [facility]:   Refer to the procedure report that was given to you for any specific questions about what was found during the examination.  If the procedure report does not answer your questions, please call your gastroenterologist to clarify.  If you requested that your care partner not be given the details of your procedure findings, then the procedure report has been included in a sealed envelope for you to review at your convenience later.  YOU SHOULD EXPECT: Some feelings of bloating in the abdomen. Passage of more gas than usual.  Walking can help get rid of the air that was put into your GI tract during the procedure and reduce the bloating. If you had a lower endoscopy (such as a colonoscopy or flexible sigmoidoscopy) you may notice spotting of blood in your stool or on the toilet paper. If you underwent a bowel prep for your procedure, you may not have a normal bowel movement for a few days.  Please Note:  You might notice some irritation and congestion in your nose or some drainage.  This is from the oxygen used during your procedure.  There is no need for concern and it should clear up in a day or so.  SYMPTOMS TO REPORT IMMEDIATELY:   Following upper endoscopy (EGD)  Vomiting of blood or coffee ground material  New chest pain or pain under the shoulder blades  Painful or persistently difficult swallowing  New shortness of breath  Fever of 100F or higher  Black, tarry-looking stools  For urgent or emergent issues, a gastroenterologist can be reached at any hour by calling [phone].   DIET: Your first meal following the procedure should be a small meal and then it is ok to progress to your normal diet. Heavy or fried foods are harder to digest and may make you feel nauseous or bloated.  Likewise, meals heavy in dairy and vegetables can increase bloating.  Drink plenty of fluids but you should avoid alcoholic beverages for  24 hours.  ACTIVITY:  You should plan to take it easy for the rest of today and you should NOT DRIVE or use heavy machinery until tomorrow (because of the sedation medicines used during the test).    FOLLOW UP: Our staff will call the number listed on your records the next business day following your procedure to check on you and address any questions or concerns that you may have regarding the information given to you following your procedure. If we do not reach you, we will leave a message.  However, if you are feeling well and you are not experiencing any problems, there is no need to return our call.  We will assume that you have returned to your regular daily activities without incident.  If any biopsies were taken you will be contacted by phone or by letter within the next 1-3 weeks.  Please call us at [phone] if you have not heard about the biopsies in 3 weeks.    SIGNATURES/CONFIDENTIALITY: You and/or your care partner have signed paperwork which will be entered into your electronic medical record.  These signatures attest to the fact that that the information above on your After Visit Summary has been reviewed and is understood.  Full responsibility of the confidentiality of this discharge information lies with you and/or your care-partner.  Post dilation diet given. Stricture and hiatal hernia information given. Continue daily antiacid medication. Resume plavix tomorrow.   Call  office for appointment in 4-6 weeks to report your response to dilation.

## 2014-04-02 NOTE — Progress Notes (Signed)
Called to room to assist during endoscopic procedure.  Patient ID and intended procedure confirmed with present staff. Received instructions for my participation in the procedure from the performing physician.  

## 2014-04-02 NOTE — Progress Notes (Signed)
A/ox3 pleased with MAC, report to Jane RN 

## 2014-04-03 ENCOUNTER — Telehealth: Payer: Self-pay | Admitting: *Deleted

## 2014-04-03 NOTE — Telephone Encounter (Signed)
  Follow up Call-  Call back number 04/02/2014 11/23/2011  Post procedure Call Back phone  # [phone]  Permission to leave phone message Yes Yes     Patient questions:  Do you have a fever, pain , or abdominal swelling? No. Pain Score  0 *  Have you tolerated food without any problems? Yes.    Have you been able to return to your normal activities? Yes.    Do you have any questions about your discharge instructions: Diet   No. Medications  No. Follow up visit  No.  Do you have questions or concerns about your Care? No.  Actions: * If pain score is 4 or above: No action needed, pain <4.

## 2014-04-04 DIAGNOSIS — S76112D Strain of left quadriceps muscle, fascia and tendon, subsequent encounter: Secondary | ICD-10-CM | POA: Diagnosis not present

## 2014-04-04 DIAGNOSIS — K219 Gastro-esophageal reflux disease without esophagitis: Secondary | ICD-10-CM | POA: Diagnosis not present

## 2014-04-04 DIAGNOSIS — Z96652 Presence of left artificial knee joint: Secondary | ICD-10-CM | POA: Diagnosis not present

## 2014-04-04 DIAGNOSIS — H3530 Unspecified macular degeneration: Secondary | ICD-10-CM | POA: Diagnosis not present

## 2014-04-04 DIAGNOSIS — M1990 Unspecified osteoarthritis, unspecified site: Secondary | ICD-10-CM | POA: Diagnosis not present

## 2014-04-08 DIAGNOSIS — S86812D Strain of other muscle(s) and tendon(s) at lower leg level, left leg, subsequent encounter: Secondary | ICD-10-CM | POA: Diagnosis not present

## 2014-04-08 DIAGNOSIS — Z471 Aftercare following joint replacement surgery: Secondary | ICD-10-CM | POA: Diagnosis not present

## 2014-04-08 DIAGNOSIS — Z4789 Encounter for other orthopedic aftercare: Secondary | ICD-10-CM | POA: Diagnosis not present

## 2014-04-08 DIAGNOSIS — Z96652 Presence of left artificial knee joint: Secondary | ICD-10-CM | POA: Diagnosis not present

## 2014-04-09 DIAGNOSIS — K219 Gastro-esophageal reflux disease without esophagitis: Secondary | ICD-10-CM | POA: Diagnosis not present

## 2014-04-09 DIAGNOSIS — M1990 Unspecified osteoarthritis, unspecified site: Secondary | ICD-10-CM | POA: Diagnosis not present

## 2014-04-09 DIAGNOSIS — Z96652 Presence of left artificial knee joint: Secondary | ICD-10-CM | POA: Diagnosis not present

## 2014-04-09 DIAGNOSIS — H3530 Unspecified macular degeneration: Secondary | ICD-10-CM | POA: Diagnosis not present

## 2014-04-09 DIAGNOSIS — S76112D Strain of left quadriceps muscle, fascia and tendon, subsequent encounter: Secondary | ICD-10-CM | POA: Diagnosis not present

## 2014-04-15 ENCOUNTER — Telehealth: Payer: Self-pay | Admitting: Gastroenterology

## 2014-04-15 DIAGNOSIS — M1990 Unspecified osteoarthritis, unspecified site: Secondary | ICD-10-CM | POA: Diagnosis not present

## 2014-04-15 DIAGNOSIS — H3530 Unspecified macular degeneration: Secondary | ICD-10-CM | POA: Diagnosis not present

## 2014-04-15 DIAGNOSIS — K219 Gastro-esophageal reflux disease without esophagitis: Secondary | ICD-10-CM | POA: Diagnosis not present

## 2014-04-15 DIAGNOSIS — S76112D Strain of left quadriceps muscle, fascia and tendon, subsequent encounter: Secondary | ICD-10-CM | POA: Diagnosis not present

## 2014-04-15 DIAGNOSIS — Z96652 Presence of left artificial knee joint: Secondary | ICD-10-CM | POA: Diagnosis not present

## 2014-04-15 NOTE — Telephone Encounter (Signed)
Pt states she has IBS and her "guts are just angry." Pt saw in a magazine something called IBD guard and wanted to know if this might help. Discussed with pt that this is an OTC med and she is welcome to try it and see if it helps. Pt verbalized understanding.

## 2014-04-18 DIAGNOSIS — K219 Gastro-esophageal reflux disease without esophagitis: Secondary | ICD-10-CM | POA: Diagnosis not present

## 2014-04-18 DIAGNOSIS — M1990 Unspecified osteoarthritis, unspecified site: Secondary | ICD-10-CM | POA: Diagnosis not present

## 2014-04-18 DIAGNOSIS — S76112D Strain of left quadriceps muscle, fascia and tendon, subsequent encounter: Secondary | ICD-10-CM | POA: Diagnosis not present

## 2014-04-18 DIAGNOSIS — Z96652 Presence of left artificial knee joint: Secondary | ICD-10-CM | POA: Diagnosis not present

## 2014-04-18 DIAGNOSIS — H3530 Unspecified macular degeneration: Secondary | ICD-10-CM | POA: Diagnosis not present

## 2014-04-21 ENCOUNTER — Telehealth: Payer: Self-pay | Admitting: Gastroenterology

## 2014-04-21 NOTE — Telephone Encounter (Signed)
Pt states she is able to swallow better since her procedure but reports she is still having abdominal pain. States she is also having either a little bit of stool or a "complete blow-out." Pt scheduled to see 04/28/14@9 :45am. Pt aware of appt.

## 2014-04-22 DIAGNOSIS — Z96652 Presence of left artificial knee joint: Secondary | ICD-10-CM | POA: Diagnosis not present

## 2014-04-22 DIAGNOSIS — S86812D Strain of other muscle(s) and tendon(s) at lower leg level, left leg, subsequent encounter: Secondary | ICD-10-CM | POA: Diagnosis not present

## 2014-04-22 DIAGNOSIS — Z471 Aftercare following joint replacement surgery: Secondary | ICD-10-CM | POA: Diagnosis not present

## 2014-04-22 DIAGNOSIS — Z4789 Encounter for other orthopedic aftercare: Secondary | ICD-10-CM | POA: Diagnosis not present

## 2014-04-24 DIAGNOSIS — M1990 Unspecified osteoarthritis, unspecified site: Secondary | ICD-10-CM | POA: Diagnosis not present

## 2014-04-24 DIAGNOSIS — Z96652 Presence of left artificial knee joint: Secondary | ICD-10-CM | POA: Diagnosis not present

## 2014-04-24 DIAGNOSIS — H3530 Unspecified macular degeneration: Secondary | ICD-10-CM | POA: Diagnosis not present

## 2014-04-24 DIAGNOSIS — S76112D Strain of left quadriceps muscle, fascia and tendon, subsequent encounter: Secondary | ICD-10-CM | POA: Diagnosis not present

## 2014-04-24 DIAGNOSIS — K219 Gastro-esophageal reflux disease without esophagitis: Secondary | ICD-10-CM | POA: Diagnosis not present

## 2014-04-28 ENCOUNTER — Ambulatory Visit: Payer: Medicare Other | Admitting: Gastroenterology

## 2014-04-29 DIAGNOSIS — Z96652 Presence of left artificial knee joint: Secondary | ICD-10-CM | POA: Diagnosis not present

## 2014-04-29 DIAGNOSIS — K219 Gastro-esophageal reflux disease without esophagitis: Secondary | ICD-10-CM | POA: Diagnosis not present

## 2014-04-29 DIAGNOSIS — S76112D Strain of left quadriceps muscle, fascia and tendon, subsequent encounter: Secondary | ICD-10-CM | POA: Diagnosis not present

## 2014-04-29 DIAGNOSIS — H3530 Unspecified macular degeneration: Secondary | ICD-10-CM | POA: Diagnosis not present

## 2014-04-29 DIAGNOSIS — M1990 Unspecified osteoarthritis, unspecified site: Secondary | ICD-10-CM | POA: Diagnosis not present

## 2014-04-30 DIAGNOSIS — S76112D Strain of left quadriceps muscle, fascia and tendon, subsequent encounter: Secondary | ICD-10-CM | POA: Diagnosis not present

## 2014-04-30 DIAGNOSIS — Z96652 Presence of left artificial knee joint: Secondary | ICD-10-CM | POA: Diagnosis not present

## 2014-04-30 DIAGNOSIS — K219 Gastro-esophageal reflux disease without esophagitis: Secondary | ICD-10-CM | POA: Diagnosis not present

## 2014-04-30 DIAGNOSIS — H3530 Unspecified macular degeneration: Secondary | ICD-10-CM | POA: Diagnosis not present

## 2014-04-30 DIAGNOSIS — M1990 Unspecified osteoarthritis, unspecified site: Secondary | ICD-10-CM | POA: Diagnosis not present

## 2014-05-02 DIAGNOSIS — Z96652 Presence of left artificial knee joint: Secondary | ICD-10-CM | POA: Diagnosis not present

## 2014-05-02 DIAGNOSIS — H3530 Unspecified macular degeneration: Secondary | ICD-10-CM | POA: Diagnosis not present

## 2014-05-02 DIAGNOSIS — S76112D Strain of left quadriceps muscle, fascia and tendon, subsequent encounter: Secondary | ICD-10-CM | POA: Diagnosis not present

## 2014-05-02 DIAGNOSIS — K219 Gastro-esophageal reflux disease without esophagitis: Secondary | ICD-10-CM | POA: Diagnosis not present

## 2014-05-02 DIAGNOSIS — M1990 Unspecified osteoarthritis, unspecified site: Secondary | ICD-10-CM | POA: Diagnosis not present

## 2014-05-05 DIAGNOSIS — Z96652 Presence of left artificial knee joint: Secondary | ICD-10-CM | POA: Diagnosis not present

## 2014-05-05 DIAGNOSIS — S76112D Strain of left quadriceps muscle, fascia and tendon, subsequent encounter: Secondary | ICD-10-CM | POA: Diagnosis not present

## 2014-05-05 DIAGNOSIS — K219 Gastro-esophageal reflux disease without esophagitis: Secondary | ICD-10-CM | POA: Diagnosis not present

## 2014-05-05 DIAGNOSIS — H3530 Unspecified macular degeneration: Secondary | ICD-10-CM | POA: Diagnosis not present

## 2014-05-05 DIAGNOSIS — M1990 Unspecified osteoarthritis, unspecified site: Secondary | ICD-10-CM | POA: Diagnosis not present

## 2014-05-07 ENCOUNTER — Encounter: Payer: Medicare Other | Admitting: Family Medicine

## 2014-05-07 DIAGNOSIS — H3530 Unspecified macular degeneration: Secondary | ICD-10-CM | POA: Diagnosis not present

## 2014-05-07 DIAGNOSIS — Z96652 Presence of left artificial knee joint: Secondary | ICD-10-CM | POA: Diagnosis not present

## 2014-05-07 DIAGNOSIS — K219 Gastro-esophageal reflux disease without esophagitis: Secondary | ICD-10-CM | POA: Diagnosis not present

## 2014-05-07 DIAGNOSIS — M1990 Unspecified osteoarthritis, unspecified site: Secondary | ICD-10-CM | POA: Diagnosis not present

## 2014-05-07 DIAGNOSIS — S76112D Strain of left quadriceps muscle, fascia and tendon, subsequent encounter: Secondary | ICD-10-CM | POA: Diagnosis not present

## 2014-05-09 DIAGNOSIS — S76112D Strain of left quadriceps muscle, fascia and tendon, subsequent encounter: Secondary | ICD-10-CM | POA: Diagnosis not present

## 2014-05-09 DIAGNOSIS — M1990 Unspecified osteoarthritis, unspecified site: Secondary | ICD-10-CM | POA: Diagnosis not present

## 2014-05-09 DIAGNOSIS — K219 Gastro-esophageal reflux disease without esophagitis: Secondary | ICD-10-CM | POA: Diagnosis not present

## 2014-05-09 DIAGNOSIS — H3530 Unspecified macular degeneration: Secondary | ICD-10-CM | POA: Diagnosis not present

## 2014-05-09 DIAGNOSIS — Z96652 Presence of left artificial knee joint: Secondary | ICD-10-CM | POA: Diagnosis not present

## 2014-05-13 ENCOUNTER — Encounter: Payer: Medicare Other | Admitting: Family Medicine

## 2014-05-13 DIAGNOSIS — S86812D Strain of other muscle(s) and tendon(s) at lower leg level, left leg, subsequent encounter: Secondary | ICD-10-CM | POA: Diagnosis not present

## 2014-05-13 DIAGNOSIS — Z4789 Encounter for other orthopedic aftercare: Secondary | ICD-10-CM | POA: Diagnosis not present

## 2014-05-27 ENCOUNTER — Telehealth: Payer: Self-pay | Admitting: Family Medicine

## 2014-05-27 DIAGNOSIS — E559 Vitamin D deficiency, unspecified: Secondary | ICD-10-CM

## 2014-05-27 DIAGNOSIS — R5382 Chronic fatigue, unspecified: Secondary | ICD-10-CM

## 2014-05-27 DIAGNOSIS — E785 Hyperlipidemia, unspecified: Secondary | ICD-10-CM

## 2014-05-27 DIAGNOSIS — E876 Hypokalemia: Secondary | ICD-10-CM

## 2014-05-27 NOTE — Telephone Encounter (Signed)
-----   Message from Ellamae Sia sent at 05/22/2014 10:59 AM EDT ----- Regarding: Lab orders for Wednesday, 5.11.16 Patient is scheduled for CPX labs, please order future labs, Thanks , Karna Christmas

## 2014-05-28 ENCOUNTER — Other Ambulatory Visit (INDEPENDENT_AMBULATORY_CARE_PROVIDER_SITE_OTHER): Payer: Medicare Other

## 2014-05-28 DIAGNOSIS — E785 Hyperlipidemia, unspecified: Secondary | ICD-10-CM

## 2014-05-28 DIAGNOSIS — R5382 Chronic fatigue, unspecified: Secondary | ICD-10-CM | POA: Diagnosis not present

## 2014-05-28 DIAGNOSIS — E876 Hypokalemia: Secondary | ICD-10-CM

## 2014-05-28 DIAGNOSIS — E559 Vitamin D deficiency, unspecified: Secondary | ICD-10-CM | POA: Diagnosis not present

## 2014-05-28 LAB — CBC WITH DIFFERENTIAL/PLATELET
BASOS PCT: 1.1 % (ref 0.0–3.0)
Basophils Absolute: 0.1 10*3/uL (ref 0.0–0.1)
Eosinophils Absolute: 0.2 10*3/uL (ref 0.0–0.7)
Eosinophils Relative: 3.1 % (ref 0.0–5.0)
HCT: 36.7 % (ref 36.0–46.0)
Hemoglobin: 11.8 g/dL — ABNORMAL LOW (ref 12.0–15.0)
LYMPHS ABS: 1.8 10*3/uL (ref 0.7–4.0)
Lymphocytes Relative: 37.6 % (ref 12.0–46.0)
MCHC: 32.2 g/dL (ref 30.0–36.0)
MCV: 72.5 fl — ABNORMAL LOW (ref 78.0–100.0)
MONOS PCT: 9.9 % (ref 3.0–12.0)
Monocytes Absolute: 0.5 10*3/uL (ref 0.1–1.0)
Neutro Abs: 2.4 10*3/uL (ref 1.4–7.7)
Neutrophils Relative %: 48.3 % (ref 43.0–77.0)
PLATELETS: 344 10*3/uL (ref 150.0–400.0)
RBC: 5.07 Mil/uL (ref 3.87–5.11)
RDW: 18.8 % — ABNORMAL HIGH (ref 11.5–15.5)
WBC: 4.9 10*3/uL (ref 4.0–10.5)

## 2014-05-28 LAB — COMPREHENSIVE METABOLIC PANEL
ALT: 11 U/L (ref 0–35)
AST: 19 U/L (ref 0–37)
Albumin: 4 g/dL (ref 3.5–5.2)
Alkaline Phosphatase: 82 U/L (ref 39–117)
BUN: 14 mg/dL (ref 6–23)
CALCIUM: 9.7 mg/dL (ref 8.4–10.5)
CHLORIDE: 104 meq/L (ref 96–112)
CO2: 28 meq/L (ref 19–32)
CREATININE: 0.81 mg/dL (ref 0.40–1.20)
GFR: 72.8 mL/min (ref >60.00)
Glucose, Bld: 97 mg/dL (ref 70–99)
Potassium: 4.2 mEq/L (ref 3.5–5.1)
Sodium: 139 mEq/L (ref 135–145)
Total Bilirubin: 0.4 mg/dL (ref 0.2–1.2)
Total Protein: 7.2 g/dL (ref 6.0–8.3)

## 2014-05-28 LAB — LIPID PANEL
CHOLESTEROL: 173 mg/dL (ref 0–200)
HDL: 69.9 mg/dL (ref >39.00)
LDL Cholesterol: 80 mg/dL (ref 0–99)
NonHDL: 103.1
TRIGLYCERIDES: 114 mg/dL (ref 0.0–149.0)
Total CHOL/HDL Ratio: 2
VLDL: 22.8 mg/dL (ref 0.0–40.0)

## 2014-05-28 LAB — TSH: TSH: 2.56 u[IU]/mL (ref 0.35–4.50)

## 2014-05-28 LAB — VITAMIN D 25 HYDROXY (VIT D DEFICIENCY, FRACTURES): VITD: 44.47 ng/mL (ref 30.00–100.00)

## 2014-06-04 ENCOUNTER — Ambulatory Visit (INDEPENDENT_AMBULATORY_CARE_PROVIDER_SITE_OTHER): Payer: Medicare Other | Admitting: Family Medicine

## 2014-06-04 ENCOUNTER — Encounter: Payer: Self-pay | Admitting: Family Medicine

## 2014-06-04 VITALS — BP 128/64 | HR 75 | Temp 98.1°F | Ht 64.5 in | Wt 182.5 lb

## 2014-06-04 DIAGNOSIS — E2839 Other primary ovarian failure: Secondary | ICD-10-CM

## 2014-06-04 DIAGNOSIS — F4323 Adjustment disorder with mixed anxiety and depressed mood: Secondary | ICD-10-CM

## 2014-06-04 DIAGNOSIS — E559 Vitamin D deficiency, unspecified: Secondary | ICD-10-CM | POA: Diagnosis not present

## 2014-06-04 DIAGNOSIS — E669 Obesity, unspecified: Secondary | ICD-10-CM

## 2014-06-04 DIAGNOSIS — Z1211 Encounter for screening for malignant neoplasm of colon: Secondary | ICD-10-CM | POA: Insufficient documentation

## 2014-06-04 DIAGNOSIS — E785 Hyperlipidemia, unspecified: Secondary | ICD-10-CM | POA: Diagnosis not present

## 2014-06-04 DIAGNOSIS — M8580 Other specified disorders of bone density and structure, unspecified site: Secondary | ICD-10-CM | POA: Diagnosis not present

## 2014-06-04 DIAGNOSIS — Z0000 Encounter for general adult medical examination without abnormal findings: Secondary | ICD-10-CM | POA: Insufficient documentation

## 2014-06-04 MED ORDER — ESOMEPRAZOLE MAGNESIUM 40 MG PO CPDR: 40.0000 mg | DELAYED_RELEASE_CAPSULE | Freq: Two times a day (BID) | ORAL | Status: DC

## 2014-06-04 MED ORDER — ALPRAZOLAM 0.5 MG PO TABS: 0.5000 mg | ORAL_TABLET | Freq: Every evening | ORAL | Status: DC | PRN

## 2014-06-04 MED ORDER — SERTRALINE HCL 100 MG PO TABS: 200.0000 mg | ORAL_TABLET | Freq: Every day | ORAL | Status: DC

## 2014-06-04 MED ORDER — ATORVASTATIN CALCIUM 10 MG PO TABS: 10.0000 mg | ORAL_TABLET | Freq: Every day | ORAL | Status: DC

## 2014-06-04 MED ORDER — RALOXIFENE HCL 60 MG PO TABS: 60.0000 mg | ORAL_TABLET | Freq: Every day | ORAL | Status: DC

## 2014-06-04 NOTE — Assessment & Plan Note (Signed)
Reviewed health habits including diet and exercise and skin cancer prevention Reviewed appropriate screening tests for age  Also reviewed health mt list, fam hx and immunization status , as well as social and family history   See HPI She will schedule mammogram in July  Proceed with dexa  Labs reviewed  Rev ca and D intake

## 2014-06-04 NOTE — Assessment & Plan Note (Signed)
Rev last dexa and schedule 2y f/u  No falls or fractures  D level tx  Disc need for calcium/ vitamin D/ wt bearing exercise and bone density test every 2 y to monitor Disc safety/ fracture risk in detail   She will start back on evista-not sure why she stopped it after 2 y

## 2014-06-04 NOTE — Assessment & Plan Note (Signed)
Given IFOB (no colonosc due to age)

## 2014-06-04 NOTE — Assessment & Plan Note (Signed)
Ref for dexa 

## 2014-06-04 NOTE — Progress Notes (Signed)
Pre visit review using our clinic review tool, if applicable. No additional management support is needed unless otherwise documented below in the visit note. 

## 2014-06-04 NOTE — Patient Instructions (Signed)
You will be due for a mammogram in July-do not forget to schedule it  Please do the stool card for colon cancer screening  Stop at check out to schedule your bone density test    (start back on your evista)

## 2014-06-04 NOTE — Assessment & Plan Note (Signed)
Vitamin D level is therapeutic with current supplementation Disc importance of this to bone and overall health  

## 2014-06-04 NOTE — Progress Notes (Signed)
Subjective:    Patient ID: [name] [name], female    DOB: June 24, 1934, 80 y.o.   MRN: [id number]  HPI Here for annual medicare wellness visit as well as chronic/acute medical problems    I have personally reviewed the Medicare Annual Wellness questionnaire and have noted 1. The patient's medical and social history 2. Their use of alcohol, tobacco or illicit drugs 3. Their current medications and supplements 4. The patient's functional ability including ADL's, fall risks, home safety risks and hearing or visual             impairment. 5. Diet and physical activities 6. Evidence for depression or mood disorders  The patients weight, height, BMI have been recorded in the chart and visual acuity is per eye clinic.  I have made referrals, counseling and provided education to the patient based review of the above and I have provided the pt with a written personalized care plan for preventive services. Reviewed and updated provider list, see scanned forms.  Has tricare medicare   See scanned forms.  Routine anticipatory guidance given to patient.  See health maintenance. Colon cancer screening 12/10 - hyperplastic polyps , no fam hx  Breast cancer screening July 22015 -nl -will schedule her own in July  Self breast exam no lumps  Flu vaccine 11/15  Tetanus vaccine 5/09 Pneumovax 4/15 - complete with pneumonia vaccines  Zoster vaccine 3/15 dexa 2/14 - osteopenia stable , vit D level 44 - wants to schedule a dexa , can't walk due to multiple problems - not a lot of exercise , still on evista (no problems)  Advance directive has a living will and power of attorney  Cognitive function addressed- see scanned forms- and if abnormal then additional documentation follows.  Age rel changes - minor short term  She does try to stay social - but this is difficult given her problems with walking    PMH and SH reviewed  Meds, vitals, and allergies reviewed.   ROS: See HPI.  Otherwise  negative.    Has had 3 surgeries this year  Knee replacement  incl tendon repair in L knee -tear happened after her PT from her knee repl - may have to do another surgery Now in PT for that   Wt is down 4 lb  bmi is 30 Does PT and upper body exercise   Needs to eat better and eat less   Anemia - post op Lab Results  Component Value Date   WBC 4.9 05/28/2014   HGB 11.8* 05/28/2014   HCT 36.7 05/28/2014   MCV 72.5* 05/28/2014   PLT 344.0 05/28/2014   almost normal   BP Readings from Last 3 Encounters:  06/04/14 128/64  04/02/14 141/72  03/27/14 100/60   Cholesterol Lab Results  Component Value Date   CHOL 173 05/28/2014   CHOL 179 12/27/2013   CHOL 173 09/19/2012   Lab Results  Component Value Date   HDL 69.90 05/28/2014   HDL 79.60 12/27/2013   HDL 70.60 09/19/2012   Lab Results  Component Value Date   LDLCALC 80 05/28/2014   LDLCALC 79 12/27/2013   LDLCALC 77 09/19/2012   Lab Results  Component Value Date   TRIG 114.0 05/28/2014   TRIG 100.0 12/27/2013   TRIG 127.0 09/19/2012   Lab Results  Component Value Date   CHOLHDL 2 05/28/2014   CHOLHDL 2 12/27/2013   CHOLHDL 2 09/19/2012   No results found for: LDLDIRECT  Good profile with  lipitor and diet together     Chemistry      Component Value Date/Time   NA 139 05/28/2014 0948   NA 134* 01/31/2014   K 4.2 05/28/2014 0948   CL 104 05/28/2014 0948   CO2 28 05/28/2014 0948   BUN 14 05/28/2014 0948   BUN 9 01/31/2014   CREATININE 0.81 05/28/2014 0948   CREATININE 0.6 01/31/2014   GLU 119 01/31/2014      Component Value Date/Time   CALCIUM 9.7 05/28/2014 0948   ALKPHOS 82 05/28/2014 0948   AST 19 05/28/2014 0948   ALT 11 05/28/2014 0948   BILITOT 0.4 05/28/2014 0948     blood glucose 97   Lab Results  Component Value Date   TSH 2.56 05/28/2014     Mood is fair - frustrated by recent orthopedic problems    Patient Active Problem List   Diagnosis Date Noted  . Encounter for  Medicare annual wellness exam 06/04/2014  . Obesity 06/04/2014  . Dysphagia, pharyngoesophageal phase 03/27/2014  . Schatzki's ring 03/27/2014  . Patellar fracture 02/21/2014  . Patellar tendon rupture 02/21/2014  . Acute blood loss anemia 02/17/2014  . Hypokalemia 02/17/2014  . Pre-operative examination 12/27/2013  . Dysphagia 10/17/2011  . OA (osteoarthritis) of knee 08/16/2011  . Hemorrhoids 08/10/2011  . Other dyspnea and respiratory abnormality 06/21/2010  . Urinary incontinence 05/27/2010  . OVERACTIVE BLADDER 03/12/2010  . DEGENERATIVE DISC DISEASE 04/13/2009  . GERD 10/15/2008  . CEREBROVASCULAR ACCIDENT, HX OF 10/15/2008  . ARTHRITIS, GENERALIZED 06/26/2008  . EXTERNAL HEMORRHOIDS 02/01/2008  . HIATAL HERNIA 02/01/2008  . DIVERTICULOSIS OF COLON 02/01/2008  . GASTRITIS, HX OF 02/01/2008  . Vitamin D deficiency 07/03/2007  . Osteopenia 06/13/2007  . Fatigue 06/13/2007  . HELICOBACTER PYLORI INFECTION 06/06/2006  . Hyperlipidemia 06/06/2006  . Generalized anxiety disorder 06/06/2006  . DEPRESSION 06/06/2006  . MACULAR DEGENERATION 06/06/2006  . CVA 06/06/2006  . OSTEOARTHRITIS 06/06/2006  . PALPITATIONS 05/25/2006   Past Medical History  Diagnosis Date  . Painful respiration   . Personal history of unspecified circulatory disease   . Palpitations   . Other and unspecified hyperlipidemia   . Esophageal reflux   . Depressive disorder, not elsewhere classified   . Anxiety state, unspecified   . Unspecified arthropathy, multiple sites   . Diaphragmatic hernia without mention of obstruction or gangrene   . External hemorrhoids without mention of complication   . Diverticulosis of colon (without mention of hemorrhage)   . Diarrhea   . Special screening for malignant neoplasms of other sites   . Unspecified vitamin D deficiency   . Other malaise and fatigue 08-11-11    hx. "chronic fatigue syndrome"  . Disorder of bone and cartilage, unspecified   . Helicobacter  pylori (H. pylori)   . Macular degeneration (senile) of retina, unspecified   . Shortness of breath 08-11-11    with exertion only at present  . Unspecified cerebral artery occlusion with cerebral infarction 08-11-11    '97/ '06( TIA)-affected lt. side, no residual  . Gallstones 08-11-11    asymptomatic at present  . Personal history of other diseases of digestive system 08-11-11    "Irittable bowel syndrome"  . Urinary incontinence 08-11-11    wears peripad daily  . Osteoarthrosis, unspecified whether generalized or localized, unspecified site 08-11-11    hx. "rhematoid arthritis", osteoarthritis, DDD, bursitis(hip)  . Lumbar spondylosis   . Complication of anesthesia     slow to wake up with surgery several  years ago   . PONV (postoperative nausea and vomiting)   . Hiatal hernia   . Schatzki's ring    Past Surgical History  Procedure Laterality Date  . Appendectomy    . Tubal ligation    . Knee surgery  08-11-11    rt. knee scope  . Cataract extraction  08-11-11    Bilateral  . Back surgery  08-11-11    '11-hx. lumbar fusion with retained hardware  . Abdominal hysterectomy  08-11-11  . Total knee arthroplasty  08/16/2011    Procedure: TOTAL KNEE ARTHROPLASTY;  Surgeon: Gearlean Alf, MD;  Location: [facility];  Service: Orthopedics;  Laterality: Right;  . Total knee arthroplasty Left 01/27/2014    Procedure: LEFT TOTAL KNEE ARTHROPLASTY;  Surgeon: Gearlean Alf, MD;  Location: [facility];  Service: Orthopedics;  Laterality: Left;  . Patellar tendon repair Left 02/22/2014    Procedure: PATELLA TENDON REPAIR;  Surgeon: Gearlean Alf, MD;  Location: [facility];  Service: Orthopedics;  Laterality: Left;   History  Substance Use Topics  . Smoking status: Never Smoker   . Smokeless tobacco: Never Used  . Alcohol Use: No   Family History  Problem Relation Age of Onset  . Heart disease Father   . Diabetes Sister   . Coronary artery disease Brother   . Diabetes Brother   . Heart disease  Brother   . Pancreatic cancer Daughter   . Colon cancer Neg Hx   . Colon polyps Neg Hx   . Esophageal cancer Neg Hx   . Gallbladder disease Neg Hx    Allergies  Allergen Reactions  . Alendronate Sodium     REACTION: JAW PAIN  . Celecoxib     REACTION: GI UPSET  . Latex    Current Outpatient Prescriptions on File Prior to Visit  Medication Sig Dispense Refill  . acetaminophen (TYLENOL) 325 MG tablet Take 2 tablets (650 mg total) by mouth every 6 (six) hours as needed for mild pain (or Fever >/= 101). 40 tablet 0  . ALPRAZolam (XANAX) 0.5 MG tablet Take 1 tablet (0.5 mg total) by mouth at bedtime as needed for sleep. 90 tablet 1  . atorvastatin (LIPITOR) 10 MG tablet TAKE 1 TABLET DAILY 90 tablet 3  . calcium carbonate (TUMS - DOSED IN MG ELEMENTAL CALCIUM) 500 MG chewable tablet Chew 1 tablet (200 mg of elemental calcium total) by mouth 4 (four) times daily as needed for indigestion or heartburn. 10 tablet 0  . clopidogrel (PLAVIX) 75 MG tablet TAKE 1 TABLET DAILY 90 tablet 1  . docusate sodium 100 MG CAPS Take 100 mg by mouth 2 (two) times daily. 10 capsule 0  . NEXIUM 40 MG capsule TAKE 1 CAPSULE TWICE A DAY 180 capsule 1  . sertraline (ZOLOFT) 100 MG tablet TAKE 2 TABLETS DAILY 180 tablet 3   No current facility-administered medications on file prior to visit.    Review of Systems Review of Systems  Constitutional: Negative for fever, appetite change, fatigue and unexpected weight change.  Eyes: Negative for pain and visual disturbance.  Respiratory: Negative for cough and shortness of breath.   Cardiovascular: Negative for cp or palpitations    Gastrointestinal: Negative for nausea, diarrhea and constipation.  Genitourinary: Negative for urgency and frequency.  Skin: Negative for pallor or rash   MSK pos for chronic knee pain and limited mobility Neurological: Negative for weakness, light-headedness, numbness and headaches.  Hematological: Negative for adenopathy. Does not  bruise/bleed easily.  Psychiatric/Behavioral: Negative for dysphoric mood. The patient is not nervous/anxious.         Objective:   Physical Exam  Constitutional: She appears well-developed and well-nourished. No distress.  obese and well appearing   HENT:  Head: Normocephalic and atraumatic.  Right Ear: External ear normal.  Left Ear: External ear normal.  Mouth/Throat: Oropharynx is clear and moist.  Eyes: Conjunctivae and EOM are normal. Pupils are equal, round, and reactive to light. No scleral icterus.  Neck: Normal range of motion. Neck supple. No JVD present. Carotid bruit is not present. No thyromegaly present.  Cardiovascular: Normal rate, regular rhythm, normal heart sounds and intact distal pulses.  Exam reveals no gallop.   Pulmonary/Chest: Effort normal and breath sounds normal. No respiratory distress. She has no wheezes. She exhibits no tenderness.  Abdominal: Soft. Bowel sounds are normal. She exhibits no distension, no abdominal bruit and no mass. There is no tenderness.  Genitourinary: No breast swelling, tenderness, discharge or bleeding.  Breast exam: No mass, nodules, thickening, tenderness, bulging, retraction, inflamation, nipple discharge or skin changes noted.  No axillary or clavicular LA.      Musculoskeletal: She exhibits no edema or tenderness.  Poor rom knees and LS Gait is slow   Lymphadenopathy:    She has no cervical adenopathy.  Neurological: She is alert. She has normal reflexes. No cranial nerve deficit. She exhibits normal muscle tone. Coordination normal.  Skin: Skin is warm and dry. No rash noted. No erythema. No pallor.  Psychiatric: She has a normal mood and affect.          Assessment & Plan:   Problem List Items Addressed This Visit    Adjustment disorder with mixed anxiety and depressed mood    Refilled zoloft and xanax  More frustrated due to recent health problems but ok  Reviewed stressors/ coping techniques/symptoms/ support  sources/ tx options and side effects in detail today       Colon cancer screening    Given IFOB (no colonosc due to age)      Relevant Orders   Fecal occult blood, imunochemical   Encounter for Medicare annual wellness exam - Primary    Reviewed health habits including diet and exercise and skin cancer prevention Reviewed appropriate screening tests for age  Also reviewed health mt list, fam hx and immunization status , as well as social and family history   See HPI She will schedule mammogram in July  Proceed with dexa  Labs reviewed  Rev ca and D intake         Estrogen deficiency    Ref for dexa       Relevant Orders   DG Bone Density   Hyperlipidemia    Disc goals for lipids and reasons to control them Rev labs with pt Rev low sat fat diet in detail Overall stable with statin and diet       Relevant Medications   atorvastatin (LIPITOR) 10 MG tablet   Obesity   Osteopenia    Rev last dexa and schedule 2y f/u  No falls or fractures  D level tx  Disc need for calcium/ vitamin D/ wt bearing exercise and bone density test every 2 y to monitor Disc safety/ fracture risk in detail   She will start back on evista-not sure why she stopped it after 2 y       Vitamin D deficiency    Vitamin D level is therapeutic with current supplementation Disc importance of  this to bone and overall health

## 2014-06-04 NOTE — Assessment & Plan Note (Signed)
Refilled zoloft and xanax  More frustrated due to recent health problems but ok  Reviewed stressors/ coping techniques/symptoms/ support sources/ tx options and side effects in detail today

## 2014-06-04 NOTE — Assessment & Plan Note (Signed)
Disc goals for lipids and reasons to control them Rev labs with pt Rev low sat fat diet in detail Overall stable with statin and diet

## 2014-06-10 DIAGNOSIS — Z4789 Encounter for other orthopedic aftercare: Secondary | ICD-10-CM | POA: Diagnosis not present

## 2014-06-10 DIAGNOSIS — S86812D Strain of other muscle(s) and tendon(s) at lower leg level, left leg, subsequent encounter: Secondary | ICD-10-CM | POA: Diagnosis not present

## 2014-06-12 ENCOUNTER — Ambulatory Visit: Payer: Self-pay | Admitting: Orthopedic Surgery

## 2014-06-13 ENCOUNTER — Other Ambulatory Visit (INDEPENDENT_AMBULATORY_CARE_PROVIDER_SITE_OTHER): Payer: Medicare Other

## 2014-06-13 DIAGNOSIS — Z1211 Encounter for screening for malignant neoplasm of colon: Secondary | ICD-10-CM

## 2014-06-13 LAB — FECAL OCCULT BLOOD, IMMUNOCHEMICAL: FECAL OCCULT BLD: NEGATIVE

## 2014-06-13 LAB — FECAL OCCULT BLOOD, GUAIAC: FECAL OCCULT BLD: NEGATIVE

## 2014-06-17 ENCOUNTER — Encounter: Payer: Self-pay | Admitting: Family Medicine

## 2014-06-17 ENCOUNTER — Encounter: Payer: Self-pay | Admitting: *Deleted

## 2014-06-23 ENCOUNTER — Ambulatory Visit (INDEPENDENT_AMBULATORY_CARE_PROVIDER_SITE_OTHER)
Admission: RE | Admit: 2014-06-23 | Discharge: 2014-06-23 | Disposition: A | Discharge status: Home / Self Care | Payer: Medicare Other | Source: Ambulatory Visit | Attending: Family Medicine | Admitting: Family Medicine

## 2014-06-23 ENCOUNTER — Encounter (HOSPITAL_COMMUNITY)
Admission: RE | Admit: 2014-06-23 | Discharge: 2014-06-23 | Disposition: A | Discharge status: Home / Self Care | Payer: Medicare Other | Source: Ambulatory Visit | Attending: Orthopedic Surgery | Admitting: Orthopedic Surgery

## 2014-06-23 ENCOUNTER — Encounter (HOSPITAL_COMMUNITY): Payer: Self-pay

## 2014-06-23 DIAGNOSIS — E2839 Other primary ovarian failure: Secondary | ICD-10-CM

## 2014-06-23 HISTORY — DX: Frequency of micturition: R35.0

## 2014-06-23 HISTORY — DX: Other specified symptoms and signs involving the digestive system and abdomen: R19.8

## 2014-06-23 HISTORY — DX: Age-related osteoporosis without current pathological fracture: M81.0

## 2014-06-23 HISTORY — DX: Other fatigue: R53.83

## 2014-06-23 HISTORY — DX: Personal history of other medical treatment: Z92.89

## 2014-06-23 HISTORY — DX: Sleep disorder, unspecified: G47.9

## 2014-06-23 LAB — CBC
HEMATOCRIT: 36 % (ref 36.0–46.0)
Hemoglobin: 11.1 g/dL — ABNORMAL LOW (ref 12.0–15.0)
MCH: 23.6 pg — AB (ref 26.0–34.0)
MCHC: 30.8 g/dL (ref 30.0–36.0)
MCV: 76.6 fL — AB (ref 78.0–100.0)
PLATELETS: 297 10*3/uL (ref 150–400)
RBC: 4.7 MIL/uL (ref 3.87–5.11)
RDW: 18.6 % — ABNORMAL HIGH (ref 11.5–15.5)
WBC: 5 10*3/uL (ref 4.0–10.5)

## 2014-06-23 LAB — BASIC METABOLIC PANEL
ANION GAP: 8 (ref 5–15)
BUN: 16 mg/dL (ref 6–20)
CO2: 27 mmol/L (ref 22–32)
CREATININE: 1.06 mg/dL — AB (ref 0.44–1.00)
Calcium: 9.1 mg/dL (ref 8.9–10.3)
Chloride: 108 mmol/L (ref 101–111)
GFR calc Af Amer: 57 mL/min — ABNORMAL LOW (ref >60)
GFR calc non Af Amer: 49 mL/min — ABNORMAL LOW (ref >60)
GLUCOSE: 124 mg/dL — AB (ref 65–99)
POTASSIUM: 3.9 mmol/L (ref 3.5–5.1)
SODIUM: 143 mmol/L (ref 135–145)

## 2014-06-23 NOTE — Patient Instructions (Addendum)
YOUR PROCEDURE IS SCHEDULED ON :  06/27/14  REPORT TO [facility] MAIN ENTRANCE FOLLOW SIGNS TO SHORT STAY CENTER AT :  3:30 PM  CALL THIS NUMBER IF YOU HAVE PROBLEMS THE MORNING OF SURGERY [phone]  REMEMBER:  DO NOT EAT FOOD  AFTER MIDNIGHT  MAY HAVE CLEAR LIQUIDS UNTIL 12:00 NOON  TAKE THESE MEDICINES THE MORNING OF SURGERY: ZOLOFT / NEXIUM / LIPITOR  YOU MAY NOT HAVE ANY METAL ON YOUR BODY INCLUDING HAIR PINS AND PIERCING'S. DO NOT WEAR JEWELRY, MAKEUP, LOTIONS, POWDERS OR PERFUMES. DO NOT WEAR NAIL POLISH. DO NOT SHAVE 48 HRS PRIOR TO SURGERY. MEN MAY SHAVE FACE AND NECK.  DO NOT [facility]. [facility] IS NOT RESPONSIBLE FOR VALUABLES.  CONTACTS, DENTURES OR PARTIALS MAY NOT BE WORN TO SURGERY. LEAVE SUITCASE IN CAR. CAN BE BROUGHT TO ROOM AFTER SURGERY.  PATIENTS DISCHARGED THE DAY OF SURGERY WILL NOT BE ALLOWED TO DRIVE HOME.  PLEASE READ OVER THE FOLLOWING INSTRUCTION SHEETS _________________________________________________________________________________                                          [facility] - PREPARING FOR SURGERY  Before surgery, you can play an important role.  Because skin is not sterile, your skin needs to be as free of germs as possible.  You can reduce the number of germs on your skin by washing with CHG (chlorahexidine gluconate) soap before surgery.  CHG is an antiseptic cleaner which kills germs and bonds with the skin to continue killing germs even after washing. Please DO NOT use if you have an allergy to CHG or antibacterial soaps.  If your skin becomes reddened/irritated stop using the CHG and inform your nurse when you arrive at Short Stay. Do not shave (including legs and underarms) for at least 48 hours prior to the first CHG shower.  You may shave your face. Please follow these instructions carefully:   1.  Shower with CHG Soap the night before surgery and the  morning of Surgery.   2.  If you  choose to wash your hair, wash your hair first as usual with your  normal  Shampoo.   3.  After you shampoo, rinse your hair and body thoroughly to remove the  shampoo.                                         4.  Use CHG as you would any other liquid soap.  You can apply chg directly  to the skin and wash . Gently wash with scrungie or clean wascloth    5.  Apply the CHG Soap to your body ONLY FROM THE NECK DOWN.   Do not use on open                           Wound or open sores. Avoid contact with eyes, ears mouth and genitals (private parts).                        Genitals (private parts) with your normal soap.              6.  Wash thoroughly, paying special attention to the  area where your surgery  will be performed.   7.  Thoroughly rinse your body with warm water from the neck down.   8.  DO NOT shower/wash with your normal soap after using and rinsing off  the CHG Soap .                9.  Pat yourself dry with a clean towel.             10.  Wear clean night clothes to bed after shower             11.  Place clean sheets on your bed the night of your first shower and do not  sleep with pets.  Day of Surgery : Do not apply any lotions/deodorants the morning of surgery.  Please wear clean clothes to the hospital/surgery center.  FAILURE TO FOLLOW THESE INSTRUCTIONS MAY RESULT IN THE CANCELLATION OF YOUR SURGERY    PATIENT SIGNATURE_________________________________  ______________________________________________________________________               CLEAR LIQUID DIET   Foods Allowed                                                                     Foods Excluded  Coffee and tea, regular and decaf                             liquids that you cannot  Plain Jell-O in any flavor                                             see through such as: Fruit ices (not with fruit pulp)                                     milk, soups, orange juice  Iced Popsicles                                                  All solid food Carbonated beverages, regular and diet                                    Cranberry, grape and apple juices Sports drinks like Gatorade Lightly seasoned clear broth or consume(fat free) Sugar, honey syrup   _____________________________________________________________________

## 2014-06-24 LAB — HM DEXA SCAN

## 2014-06-24 NOTE — Progress Notes (Signed)
Pt notified to arrive at Short Stay 06/25/14 at 1:45 pm - may have clear liquids until 10:15 am

## 2014-06-25 ENCOUNTER — Encounter (HOSPITAL_COMMUNITY)
Admission: RE | Disposition: A | Discharge status: Skilled Nursing Facility | Payer: Self-pay | Source: Ambulatory Visit | Attending: Orthopedic Surgery

## 2014-06-25 ENCOUNTER — Encounter (HOSPITAL_COMMUNITY): Payer: Self-pay

## 2014-06-25 ENCOUNTER — Ambulatory Visit (HOSPITAL_COMMUNITY): Payer: Medicare Other | Admitting: Anesthesiology

## 2014-06-25 ENCOUNTER — Inpatient Hospital Stay (HOSPITAL_COMMUNITY): Admission: RE | Admit: 2014-06-25 | Payer: Medicare Other | Source: Ambulatory Visit

## 2014-06-25 ENCOUNTER — Inpatient Hospital Stay (HOSPITAL_COMMUNITY)
Admission: RE | Admit: 2014-06-25 | Discharge: 2014-06-28 | DRG: 501 | Disposition: A | Discharge status: Skilled Nursing Facility | Payer: Medicare Other | Source: Ambulatory Visit | Attending: Orthopedic Surgery | Admitting: Orthopedic Surgery

## 2014-06-25 DIAGNOSIS — M810 Age-related osteoporosis without current pathological fracture: Secondary | ICD-10-CM | POA: Diagnosis present

## 2014-06-25 DIAGNOSIS — Z7902 Long term (current) use of antithrombotics/antiplatelets: Secondary | ICD-10-CM | POA: Diagnosis not present

## 2014-06-25 DIAGNOSIS — M719 Bursopathy, unspecified: Secondary | ICD-10-CM | POA: Diagnosis present

## 2014-06-25 DIAGNOSIS — R002 Palpitations: Secondary | ICD-10-CM | POA: Diagnosis present

## 2014-06-25 DIAGNOSIS — M4306 Spondylolysis, lumbar region: Secondary | ICD-10-CM | POA: Diagnosis not present

## 2014-06-25 DIAGNOSIS — I69354 Hemiplegia and hemiparesis following cerebral infarction affecting left non-dominant side: Secondary | ICD-10-CM

## 2014-06-25 DIAGNOSIS — M6281 Muscle weakness (generalized): Secondary | ICD-10-CM | POA: Diagnosis not present

## 2014-06-25 DIAGNOSIS — K644 Residual hemorrhoidal skin tags: Secondary | ICD-10-CM | POA: Diagnosis present

## 2014-06-25 DIAGNOSIS — Z7982 Long term (current) use of aspirin: Secondary | ICD-10-CM | POA: Diagnosis not present

## 2014-06-25 DIAGNOSIS — Z96652 Presence of left artificial knee joint: Secondary | ICD-10-CM | POA: Diagnosis not present

## 2014-06-25 DIAGNOSIS — H3530 Unspecified macular degeneration: Secondary | ICD-10-CM | POA: Diagnosis present

## 2014-06-25 DIAGNOSIS — M069 Rheumatoid arthritis, unspecified: Secondary | ICD-10-CM | POA: Diagnosis not present

## 2014-06-25 DIAGNOSIS — Z79899 Other long term (current) drug therapy: Secondary | ICD-10-CM

## 2014-06-25 DIAGNOSIS — Z90710 Acquired absence of both cervix and uterus: Secondary | ICD-10-CM | POA: Diagnosis not present

## 2014-06-25 DIAGNOSIS — M66862 Spontaneous rupture of other tendons, left lower leg: Principal | ICD-10-CM | POA: Diagnosis present

## 2014-06-25 DIAGNOSIS — S86812D Strain of other muscle(s) and tendon(s) at lower leg level, left leg, subsequent encounter: Secondary | ICD-10-CM | POA: Diagnosis not present

## 2014-06-25 DIAGNOSIS — Z981 Arthrodesis status: Secondary | ICD-10-CM

## 2014-06-25 DIAGNOSIS — K5790 Diverticulosis of intestine, part unspecified, without perforation or abscess without bleeding: Secondary | ICD-10-CM | POA: Diagnosis present

## 2014-06-25 DIAGNOSIS — F411 Generalized anxiety disorder: Secondary | ICD-10-CM | POA: Diagnosis present

## 2014-06-25 DIAGNOSIS — K219 Gastro-esophageal reflux disease without esophagitis: Secondary | ICD-10-CM | POA: Diagnosis present

## 2014-06-25 DIAGNOSIS — E559 Vitamin D deficiency, unspecified: Secondary | ICD-10-CM | POA: Diagnosis present

## 2014-06-25 DIAGNOSIS — K449 Diaphragmatic hernia without obstruction or gangrene: Secondary | ICD-10-CM | POA: Diagnosis present

## 2014-06-25 DIAGNOSIS — F418 Other specified anxiety disorders: Secondary | ICD-10-CM | POA: Diagnosis not present

## 2014-06-25 DIAGNOSIS — M66269 Spontaneous rupture of extensor tendons, unspecified lower leg: Secondary | ICD-10-CM | POA: Diagnosis not present

## 2014-06-25 DIAGNOSIS — S86819A Strain of other muscle(s) and tendon(s) at lower leg level, unspecified leg, initial encounter: Secondary | ICD-10-CM | POA: Diagnosis present

## 2014-06-25 DIAGNOSIS — K222 Esophageal obstruction: Secondary | ICD-10-CM | POA: Diagnosis present

## 2014-06-25 DIAGNOSIS — B9681 Helicobacter pylori [H. pylori] as the cause of diseases classified elsewhere: Secondary | ICD-10-CM | POA: Diagnosis not present

## 2014-06-25 DIAGNOSIS — K5730 Diverticulosis of large intestine without perforation or abscess without bleeding: Secondary | ICD-10-CM | POA: Diagnosis not present

## 2014-06-25 DIAGNOSIS — F329 Major depressive disorder, single episode, unspecified: Secondary | ICD-10-CM | POA: Diagnosis present

## 2014-06-25 DIAGNOSIS — R2689 Other abnormalities of gait and mobility: Secondary | ICD-10-CM | POA: Diagnosis not present

## 2014-06-25 DIAGNOSIS — R278 Other lack of coordination: Secondary | ICD-10-CM | POA: Diagnosis not present

## 2014-06-25 DIAGNOSIS — M1990 Unspecified osteoarthritis, unspecified site: Secondary | ICD-10-CM | POA: Diagnosis present

## 2014-06-25 DIAGNOSIS — M818 Other osteoporosis without current pathological fracture: Secondary | ICD-10-CM | POA: Diagnosis not present

## 2014-06-25 DIAGNOSIS — M66262 Spontaneous rupture of extensor tendons, left lower leg: Secondary | ICD-10-CM | POA: Diagnosis not present

## 2014-06-25 DIAGNOSIS — Z4789 Encounter for other orthopedic aftercare: Secondary | ICD-10-CM | POA: Diagnosis not present

## 2014-06-25 HISTORY — PX: PATELLAR TENDON REPAIR: SHX737

## 2014-06-25 SURGERY — REPAIR, TENDON, PATELLAR
Anesthesia: General | Site: Knee | Laterality: Left

## 2014-06-25 MED ORDER — LIDOCAINE HCL (CARDIAC) 20 MG/ML IV SOLN
INTRAVENOUS | Status: DC | PRN
  Administered 2014-06-25: 50 mg via INTRAVENOUS

## 2014-06-25 MED ORDER — MORPHINE SULFATE 2 MG/ML IJ SOLN
1.0000 mg | INTRAMUSCULAR | Status: DC | PRN
  Administered 2014-06-25 (×2): 1 mg via INTRAVENOUS
  Filled 2014-06-25 (×2): qty 1

## 2014-06-25 MED ORDER — SODIUM CHLORIDE 0.9 % IV SOLN
INTRAVENOUS | Status: DC
  Administered 2014-06-25: 20:00:00 via INTRAVENOUS

## 2014-06-25 MED ORDER — OXYCODONE HCL 5 MG PO TABS
5.0000 mg | ORAL_TABLET | ORAL | Status: DC | PRN
  Administered 2014-06-25 – 2014-06-28 (×16): 10 mg via ORAL
  Filled 2014-06-25 (×17): qty 2

## 2014-06-25 MED ORDER — MIDAZOLAM HCL 2 MG/2ML IJ SOLN
INTRAMUSCULAR | Status: AC
  Filled 2014-06-25: qty 2

## 2014-06-25 MED ORDER — ONDANSETRON HCL 4 MG/2ML IJ SOLN
INTRAMUSCULAR | Status: DC | PRN
  Administered 2014-06-25: 4 mg via INTRAVENOUS

## 2014-06-25 MED ORDER — CEFAZOLIN SODIUM-DEXTROSE 2-3 GM-% IV SOLR
2.0000 g | INTRAVENOUS | Status: AC
  Administered 2014-06-25: 2 g via INTRAVENOUS

## 2014-06-25 MED ORDER — ACETAMINOPHEN 10 MG/ML IV SOLN
1000.0000 mg | Freq: Once | INTRAVENOUS | Status: AC
  Administered 2014-06-25: 1000 mg via INTRAVENOUS

## 2014-06-25 MED ORDER — FENTANYL CITRATE (PF) 100 MCG/2ML IJ SOLN
25.0000 ug | INTRAMUSCULAR | Status: DC | PRN
  Administered 2014-06-25 (×3): 50 ug via INTRAVENOUS

## 2014-06-25 MED ORDER — METHOCARBAMOL 500 MG PO TABS
500.0000 mg | ORAL_TABLET | Freq: Four times a day (QID) | ORAL | Status: DC | PRN
  Administered 2014-06-26 – 2014-06-28 (×7): 500 mg via ORAL
  Filled 2014-06-25 (×9): qty 1

## 2014-06-25 MED ORDER — BISACODYL 10 MG RE SUPP: 10.0000 mg | Freq: Every day | RECTAL | Status: DC | PRN

## 2014-06-25 MED ORDER — DEXAMETHASONE SODIUM PHOSPHATE 10 MG/ML IJ SOLN
10.0000 mg | Freq: Once | INTRAMUSCULAR | Status: AC
  Administered 2014-06-25: 10 mg via INTRAVENOUS

## 2014-06-25 MED ORDER — CHLORHEXIDINE GLUCONATE 4 % EX LIQD: 60.0000 mL | Freq: Once | CUTANEOUS | Status: DC

## 2014-06-25 MED ORDER — FLEET ENEMA 7-19 GM/118ML RE ENEM: 1.0000 | ENEMA | Freq: Once | RECTAL | Status: AC | PRN

## 2014-06-25 MED ORDER — ONDANSETRON HCL 4 MG/2ML IJ SOLN: 4.0000 mg | Freq: Four times a day (QID) | INTRAMUSCULAR | Status: DC | PRN

## 2014-06-25 MED ORDER — POLYETHYLENE GLYCOL 3350 17 G PO PACK
17.0000 g | PACK | Freq: Every day | ORAL | Status: DC | PRN
  Administered 2014-06-27: 17 g via ORAL
  Filled 2014-06-25: qty 1

## 2014-06-25 MED ORDER — ASPIRIN 325 MG PO TABS
325.0000 mg | ORAL_TABLET | Freq: Every day | ORAL | Status: DC
  Administered 2014-06-26 – 2014-06-28 (×3): 325 mg via ORAL
  Filled 2014-06-25 (×3): qty 1

## 2014-06-25 MED ORDER — DEXAMETHASONE SODIUM PHOSPHATE 10 MG/ML IJ SOLN
INTRAMUSCULAR | Status: AC
  Filled 2014-06-25: qty 1

## 2014-06-25 MED ORDER — ONDANSETRON HCL 4 MG PO TABS: 4.0000 mg | ORAL_TABLET | Freq: Four times a day (QID) | ORAL | Status: DC | PRN

## 2014-06-25 MED ORDER — LIDOCAINE HCL (CARDIAC) 20 MG/ML IV SOLN
INTRAVENOUS | Status: AC
  Filled 2014-06-25: qty 5

## 2014-06-25 MED ORDER — FENTANYL CITRATE (PF) 250 MCG/5ML IJ SOLN
INTRAMUSCULAR | Status: AC
  Filled 2014-06-25: qty 5

## 2014-06-25 MED ORDER — LACTATED RINGERS IV SOLN
INTRAVENOUS | Status: DC
  Administered 2014-06-25: 17:00:00 via INTRAVENOUS
  Administered 2014-06-25: 1000 mL via INTRAVENOUS

## 2014-06-25 MED ORDER — FENTANYL CITRATE (PF) 100 MCG/2ML IJ SOLN
INTRAMUSCULAR | Status: AC
  Filled 2014-06-25: qty 2

## 2014-06-25 MED ORDER — METHOCARBAMOL 1000 MG/10ML IJ SOLN
500.0000 mg | Freq: Four times a day (QID) | INTRAVENOUS | Status: DC | PRN
  Administered 2014-06-25: 500 mg via INTRAVENOUS
  Filled 2014-06-25 (×2): qty 5

## 2014-06-25 MED ORDER — CEFAZOLIN SODIUM-DEXTROSE 2-3 GM-% IV SOLR
2.0000 g | Freq: Four times a day (QID) | INTRAVENOUS | Status: AC
  Administered 2014-06-25 – 2014-06-26 (×3): 2 g via INTRAVENOUS
  Filled 2014-06-25 (×3): qty 50

## 2014-06-25 MED ORDER — METOCLOPRAMIDE HCL 5 MG/ML IJ SOLN
5.0000 mg | Freq: Three times a day (TID) | INTRAMUSCULAR | Status: DC | PRN
Start: 2014-06-25 — End: 2014-06-28

## 2014-06-25 MED ORDER — ATORVASTATIN CALCIUM 10 MG PO TABS
10.0000 mg | ORAL_TABLET | Freq: Every day | ORAL | Status: DC
  Administered 2014-06-26 – 2014-06-28 (×3): 10 mg via ORAL
  Filled 2014-06-25 (×3): qty 1

## 2014-06-25 MED ORDER — ONDANSETRON HCL 4 MG/2ML IJ SOLN
INTRAMUSCULAR | Status: AC
  Filled 2014-06-25: qty 2

## 2014-06-25 MED ORDER — METOCLOPRAMIDE HCL 10 MG PO TABS: 5.0000 mg | ORAL_TABLET | Freq: Three times a day (TID) | ORAL | Status: DC | PRN

## 2014-06-25 MED ORDER — MIDAZOLAM HCL 5 MG/5ML IJ SOLN
INTRAMUSCULAR | Status: DC | PRN
  Administered 2014-06-25: 2 mg via INTRAVENOUS

## 2014-06-25 MED ORDER — ACETAMINOPHEN 500 MG PO TABS
1000.0000 mg | ORAL_TABLET | Freq: Four times a day (QID) | ORAL | Status: AC
  Administered 2014-06-25 – 2014-06-26 (×4): 1000 mg via ORAL
  Filled 2014-06-25 (×4): qty 2

## 2014-06-25 MED ORDER — FENTANYL CITRATE (PF) 100 MCG/2ML IJ SOLN
INTRAMUSCULAR | Status: DC | PRN
  Administered 2014-06-25 (×2): 50 ug via INTRAVENOUS
  Administered 2014-06-25 (×2): 25 ug via INTRAVENOUS
  Administered 2014-06-25: 50 ug via INTRAVENOUS
  Administered 2014-06-25: 100 ug via INTRAVENOUS
  Administered 2014-06-25 (×2): 25 ug via INTRAVENOUS

## 2014-06-25 MED ORDER — PANTOPRAZOLE SODIUM 40 MG PO TBEC
80.0000 mg | DELAYED_RELEASE_TABLET | Freq: Two times a day (BID) | ORAL | Status: DC
  Filled 2014-06-25 (×2): qty 2

## 2014-06-25 MED ORDER — PROPOFOL 10 MG/ML IV BOLUS
INTRAVENOUS | Status: DC | PRN
  Administered 2014-06-25: 50 mg via INTRAVENOUS
  Administered 2014-06-25: 150 mg via INTRAVENOUS
  Administered 2014-06-25: 20 mg via INTRAVENOUS

## 2014-06-25 MED ORDER — EPHEDRINE SULFATE 50 MG/ML IJ SOLN
INTRAMUSCULAR | Status: DC | PRN
  Administered 2014-06-25 (×2): 10 mg via INTRAVENOUS

## 2014-06-25 MED ORDER — ACETAMINOPHEN 325 MG PO TABS
650.0000 mg | ORAL_TABLET | Freq: Four times a day (QID) | ORAL | Status: DC | PRN
  Administered 2014-06-28: 650 mg via ORAL
  Filled 2014-06-25: qty 2

## 2014-06-25 MED ORDER — BUPIVACAINE HCL (PF) 0.25 % IJ SOLN
INTRAMUSCULAR | Status: AC
  Filled 2014-06-25: qty 30

## 2014-06-25 MED ORDER — ACETAMINOPHEN 10 MG/ML IV SOLN
INTRAVENOUS | Status: AC
  Filled 2014-06-25: qty 100

## 2014-06-25 MED ORDER — SODIUM CHLORIDE 0.9 % IJ SOLN
INTRAMUSCULAR | Status: AC
  Filled 2014-06-25: qty 10

## 2014-06-25 MED ORDER — CEFAZOLIN SODIUM-DEXTROSE 2-3 GM-% IV SOLR
INTRAVENOUS | Status: AC
  Filled 2014-06-25: qty 50

## 2014-06-25 MED ORDER — PROPOFOL 10 MG/ML IV BOLUS
INTRAVENOUS | Status: AC
  Filled 2014-06-25: qty 20

## 2014-06-25 MED ORDER — EPHEDRINE SULFATE 50 MG/ML IJ SOLN
INTRAMUSCULAR | Status: AC
  Filled 2014-06-25: qty 1

## 2014-06-25 MED ORDER — SODIUM CHLORIDE 0.9 % IV SOLN: INTRAVENOUS | Status: DC

## 2014-06-25 MED ORDER — ACETAMINOPHEN 650 MG RE SUPP: 650.0000 mg | Freq: Four times a day (QID) | RECTAL | Status: DC | PRN

## 2014-06-25 MED ORDER — SERTRALINE HCL 100 MG PO TABS
200.0000 mg | ORAL_TABLET | Freq: Every day | ORAL | Status: DC
  Administered 2014-06-26 – 2014-06-27 (×2): 200 mg via ORAL
  Administered 2014-06-28: 100 mg via ORAL
  Filled 2014-06-25 (×3): qty 2

## 2014-06-25 MED ORDER — CLOPIDOGREL BISULFATE 75 MG PO TABS
75.0000 mg | ORAL_TABLET | Freq: Every day | ORAL | Status: DC
  Administered 2014-06-26 – 2014-06-28 (×3): 75 mg via ORAL
  Filled 2014-06-25 (×3): qty 1

## 2014-06-25 MED ORDER — ALPRAZOLAM 0.5 MG PO TABS
0.5000 mg | ORAL_TABLET | Freq: Every evening | ORAL | Status: DC | PRN
  Administered 2014-06-26 – 2014-06-27 (×2): 0.5 mg via ORAL
  Filled 2014-06-25 (×3): qty 1

## 2014-06-25 SURGICAL SUPPLY — 44 items
BAG ZIPLOCK 12X15 (MISCELLANEOUS) ×3 IMPLANT
BANDAGE ELASTIC 6 VELCRO ST LF (GAUZE/BANDAGES/DRESSINGS) ×3 IMPLANT
BANDAGE ESMARK 6X9 LF (GAUZE/BANDAGES/DRESSINGS) ×1 IMPLANT
BIT DRILL 2.4X128 (BIT) ×2 IMPLANT
BIT DRILL 2.4X128MM (BIT) ×1
BNDG ESMARK 6X9 LF (GAUZE/BANDAGES/DRESSINGS) ×3
CLOSURE WOUND 1/2 X4 (GAUZE/BANDAGES/DRESSINGS) ×1
CUFF TOURN SGL QUICK 34 (TOURNIQUET CUFF) ×2
CUFF TRNQT CYL 34X4X40X1 (TOURNIQUET CUFF) ×1 IMPLANT
DRAPE INCISE IOBAN 66X45 STRL (DRAPES) ×3 IMPLANT
DRAPE POUCH INSTRU U-SHP 10X18 (DRAPES) ×3 IMPLANT
DRAPE U-SHAPE 47X51 STRL (DRAPES) ×3 IMPLANT
DRSG ADAPTIC 3X8 NADH LF (GAUZE/BANDAGES/DRESSINGS) ×3 IMPLANT
DRSG PAD ABDOMINAL 8X10 ST (GAUZE/BANDAGES/DRESSINGS) IMPLANT
DURAPREP 26ML APPLICATOR (WOUND CARE) ×3 IMPLANT
ELECT REM PT RETURN 9FT ADLT (ELECTROSURGICAL) ×3
ELECTRODE REM PT RTRN 9FT ADLT (ELECTROSURGICAL) ×1 IMPLANT
GAUZE SPONGE 4X4 12PLY STRL (GAUZE/BANDAGES/DRESSINGS) ×3 IMPLANT
GLOVE BIO SURGEON STRL SZ7.5 (GLOVE) ×3 IMPLANT
GLOVE BIO SURGEON STRL SZ8 (GLOVE) ×3 IMPLANT
GLOVE BIOGEL PI IND STRL 8 (GLOVE) ×2 IMPLANT
GLOVE BIOGEL PI INDICATOR 8 (GLOVE) ×4
GOWN STRL REUS W/TWL LRG LVL3 (GOWN DISPOSABLE) ×3 IMPLANT
GOWN STRL REUS W/TWL XL LVL3 (GOWN DISPOSABLE) ×3 IMPLANT
IMMOBILIZER KNEE 20 (SOFTGOODS) ×3 IMPLANT
IMMOBILIZER KNEE 20 THIGH 36 (SOFTGOODS) IMPLANT
MANIFOLD NEPTUNE II (INSTRUMENTS) ×3 IMPLANT
NEEDLE MA TROC 1/2 (NEEDLE) ×6 IMPLANT
NEEDLE MAYO .5 CIRCLE (NEEDLE) IMPLANT
PACK TOTAL JOINT (CUSTOM PROCEDURE TRAY) ×3 IMPLANT
PAD ABD 8X10 STRL (GAUZE/BANDAGES/DRESSINGS) ×3 IMPLANT
PADDING CAST COTTON 6X4 STRL (CAST SUPPLIES) ×3 IMPLANT
PASSER SUT SWANSON 36MM LOOP (INSTRUMENTS) ×3 IMPLANT
POSITIONER SURGICAL ARM (MISCELLANEOUS) ×3 IMPLANT
STRIP CLOSURE SKIN 1/2X4 (GAUZE/BANDAGES/DRESSINGS) ×2 IMPLANT
SUT ETHIBOND NAB CT1 #1 30IN (SUTURE) ×6 IMPLANT
SUT FIBERWIRE #2 38 T-5 BLUE (SUTURE) ×12
SUT MNCRL AB 4-0 PS2 18 (SUTURE) ×3 IMPLANT
SUT VIC AB 1 CT1 27 (SUTURE)
SUT VIC AB 1 CT1 27XBRD ANTBC (SUTURE) IMPLANT
SUT VIC AB 2-0 CT1 27 (SUTURE) ×6
SUT VIC AB 2-0 CT1 TAPERPNT 27 (SUTURE) ×3 IMPLANT
SUTURE FIBERWR #2 38 T-5 BLUE (SUTURE) ×4 IMPLANT
TOWEL OR 17X26 10 PK STRL BLUE (TOWEL DISPOSABLE) ×3 IMPLANT

## 2014-06-25 NOTE — Transfer of Care (Signed)
Immediate Anesthesia Transfer of Care Note  Patient: [name] [name]  Procedure(s) Performed: Procedure(s): LEFT PATELLA TENDON REPAIR (Left)  Patient Location: PACU  Anesthesia Type:General  Level of Consciousness:  sedated, patient cooperative and responds to stimulation  Airway & Oxygen Therapy:Patient Spontanous Breathing and Patient connected to face mask oxgen  Post-op Assessment:  Report given to PACU RN and Post -op Vital signs reviewed and stable  Post vital signs:  Reviewed and stable  Last Vitals:  Filed Vitals:   06/25/14 1800  BP:   Pulse: 104  Temp:   Resp: 20    Complications: No apparent anesthesia complications

## 2014-06-25 NOTE — Brief Op Note (Signed)
06/25/2014  5:34 PM  PATIENT:  [name] [name]  80 y.o. female  PRE-OPERATIVE DIAGNOSIS:  LEFT KNEE RECURRENT PATELLA TENDON RUPUTED   POST-OPERATIVE DIAGNOSIS:  LEFT KNEE RECURRENT PATELLA TENDON RUPUTED   PROCEDURE:  Procedure(s): LEFT PATELLA TENDON REPAIR (Left)  SURGEON:  Surgeon(s) and Role:    * Gaynelle Arabian, MD - Primary  PHYSICIAN ASSISTANT:   ASSISTANTS: Arlee Muslim, PA-C   ANESTHESIA:   general  EBL:  Total I/O In: 1100 [I.V.:1100] Out: 25 [Blood:25]  BLOOD ADMINISTERED:none  DRAINS: none   LOCAL MEDICATIONS USED:  NONE  COUNTS:  YES  TOURNIQUET:   Total Tourniquet Time Documented: Thigh (Left) - 44 minutes Total: Thigh (Left) - 44 minutes   DICTATION: .Other Dictation: Dictation Number [id number]  PLAN OF CARE: Admit to inpatient   PATIENT DISPOSITION:  PACU - hemodynamically stable.

## 2014-06-25 NOTE — Anesthesia Postprocedure Evaluation (Signed)
  Anesthesia Post-op Note  Patient: [name] [name]  Procedure(s) Performed: Procedure(s) (LRB): LEFT PATELLA TENDON REPAIR (Left)  Patient Location: PACU  Anesthesia Type: General  Level of Consciousness: awake and alert   Airway and Oxygen Therapy: Patient Spontanous Breathing  Post-op Pain: mild  Post-op Assessment: Post-op Vital signs reviewed, Patient's Cardiovascular Status Stable, Respiratory Function Stable, Patent Airway and No signs of Nausea or vomiting  Last Vitals:  Filed Vitals:   06/25/14 1800  BP: 113/67  Pulse: 104  Temp: 36.9 C  Resp: 20    Post-op Vital Signs: stable   Complications: No apparent anesthesia complications

## 2014-06-25 NOTE — Interval H&P Note (Signed)
History and Physical Interval Note:  06/25/2014 4:17 PM  [name] [name]  has presented today for surgery, with the diagnosis of LEFT KNEE RECURRENT PATELLA TENDON RUPUTED   The various methods of treatment have been discussed with the patient and family. After consideration of risks, benefits and other options for treatment, the patient has consented to  Procedure(s): [place]   (Left) as a surgical intervention .  The patient's history has been reviewed, patient examined, no change in status, stable for surgery.  I have reviewed the patient's chart and labs.  Questions were answered to the patient's satisfaction.     Gearlean Alf

## 2014-06-25 NOTE — Anesthesia Preprocedure Evaluation (Addendum)
Anesthesia Evaluation  Patient identified by MRN, date of birth, ID band Patient awake    Reviewed: Allergy & Precautions, H&P , NPO status , Patient's Chart, lab work & pertinent test results  History of Anesthesia Complications (+) PONV  Airway Mallampati: II  TM Distance: >3 FB Neck ROM: full    Dental  (+) Edentulous Upper, Edentulous Lower, Dental Advisory Given   Pulmonary neg pulmonary ROS, shortness of breath and with exertion,  breath sounds clear to auscultation  Pulmonary exam normal       Cardiovascular Exercise Tolerance: Good negative cardio ROS Normal cardiovascular examRhythm:regular Rate:Normal  palpitations   Neuro/Psych Anxiety Depression CVA - 7/13 TIACVA, Residual Symptoms negative psych ROS   GI/Hepatic negative GI ROS, Neg liver ROS, hiatal hernia, GERD-  Medicated and Controlled,  Endo/Other  negative endocrine ROS  Renal/GU negative Renal ROS  negative genitourinary   Musculoskeletal  (+) Arthritis -, Rheumatoid disorders,    Abdominal   Peds  Hematology negative hematology ROS (+)   Anesthesia Other Findings   Reproductive/Obstetrics negative OB ROS                            Anesthesia Physical Anesthesia Plan  ASA: III  Anesthesia Plan: General   Post-op Pain Management:    Induction: Intravenous  Airway Management Planned: LMA  Additional Equipment:   Intra-op Plan:   Post-operative Plan:   Informed Consent:   Plan Discussed with: Surgeon  Anesthesia Plan Comments:         Anesthesia Quick Evaluation

## 2014-06-25 NOTE — H&P (Signed)
CC- [name] [name] is a 80 y.o. female who presents with left knee pain.  HPI- . Knee Pain: Patient presents with knee pain involving the  left knee. Onset of the symptoms was several weeks ago. Inciting event: she had a patellar tendon repair which has re-ruptured and she can not extend her knee. Current symptoms include giving out. Pain is aggravated by rising after sitting.  Patient has had prior knee problems. Evaluation to date: plain films: abnormal patella is situated superiorly consistent with migration. Treatment to date: bracing.  Past Medical History  Diagnosis Date  . Palpitations   . Esophageal reflux   . Depressive disorder, not elsewhere classified   . Anxiety state, unspecified   . Diaphragmatic hernia without mention of obstruction or gangrene   . External hemorrhoids without mention of complication   . Diverticulosis of colon (without mention of hemorrhage)   . Unspecified vitamin D deficiency   . Helicobacter pylori (H. pylori)   . Macular degeneration (senile) of retina, unspecified   . Shortness of breath 08-11-11    with exertion only at present  . Osteoarthrosis, unspecified whether generalized or localized, unspecified site 08-11-11    hx. "rhematoid arthritis", osteoarthritis, DDD, bursitis(hip)  . Lumbar spondylosis   . Complication of anesthesia     slow to wake up with surgery several years ago   . PONV (postoperative nausea and vomiting)   . Hiatal hernia   . Schatzki's ring   . Unspecified cerebral artery occlusion with cerebral infarction 08-11-11    '97/ '06( TIA)-affected lt. side, slight weakness L side  . Osteoporosis   . Alternating constipation and diarrhea   . Frequency of urination   . History of transfusion   . Difficulty sleeping   . Fatigue     Past Surgical History  Procedure Laterality Date  . Appendectomy    . Tubal ligation    . Knee surgery  08-11-11    rt. knee scope  . Cataract extraction  08-11-11    Bilateral  . Back  surgery  08-11-11    '11-hx. lumbar fusion with retained hardware  . Abdominal hysterectomy  08-11-11  . Total knee arthroplasty  08/16/2011    Procedure: TOTAL KNEE ARTHROPLASTY;  Surgeon: Gearlean Alf, MD;  Location: [facility];  Service: Orthopedics;  Laterality: Right;  . Total knee arthroplasty Left 01/27/2014    Procedure: LEFT TOTAL KNEE ARTHROPLASTY;  Surgeon: Gearlean Alf, MD;  Location: [facility];  Service: Orthopedics;  Laterality: Left;  . Patellar tendon repair Left 02/22/2014    Procedure: PATELLA TENDON REPAIR;  Surgeon: Gearlean Alf, MD;  Location: [facility];  Service: Orthopedics;  Laterality: Left;  . Esophageal dilation      Prior to Admission medications   Medication Sig Start Date End Date Taking? Authorizing Provider  acetaminophen (TYLENOL) 325 MG tablet Take 2 tablets (650 mg total) by mouth every 6 (six) hours as needed for mild pain (or Fever >/= 101). 01/29/14  Yes Arlee Muslim, PA-C  ALPRAZolam Duanne Moron) 0.5 MG tablet Take 1 tablet (0.5 mg total) by mouth at bedtime as needed for sleep. 06/04/14  Yes Abner Greenspan, MD  atorvastatin (LIPITOR) 10 MG tablet Take 1 tablet (10 mg total) by mouth daily. 06/04/14  Yes Abner Greenspan, MD  calcium carbonate (TUMS - DOSED IN MG ELEMENTAL CALCIUM) 500 MG chewable tablet Chew 1 tablet (200 mg of elemental calcium total) by mouth 4 (four) times daily as needed for indigestion  or heartburn. 02/25/14  Yes Arlee Muslim, PA-C  clopidogrel (PLAVIX) 75 MG tablet TAKE 1 TABLET DAILY 03/27/14  Yes Abner Greenspan, MD  esomeprazole (NEXIUM) 40 MG capsule Take 1 capsule (40 mg total) by mouth 2 (two) times daily. 06/04/14  Yes Abner Greenspan, MD  sertraline (ZOLOFT) 100 MG tablet Take 2 tablets (200 mg total) by mouth daily. 06/04/14  Yes Abner Greenspan, MD  docusate sodium 100 MG CAPS Take 100 mg by mouth 2 (two) times daily. Patient not taking: Reported on 06/19/2014 01/29/14   Arlee Muslim, PA-C  raloxifene (EVISTA) 60 MG tablet Take 1 tablet (60 mg total) by  mouth daily. Patient not taking: Reported on 06/19/2014 06/04/14   Abner Greenspan, MD   KNEE EXAM antalgic gait,no effusion, collateral ligaments intact, unable to actively extend knee  Physical Examination: General appearance - alert, well appearing, and in no distress Mental status - alert, oriented to person, place, and time Chest - clear to auscultation, no wheezes, rales or rhonchi, symmetric air entry Heart - normal rate, regular rhythm, normal S1, S2, no murmurs, rubs, clicks or gallops Abdomen - soft, nontender, nondistended, no masses or organomegaly Neurological - alert, oriented, normal speech, no focal findings or movement disorder noted    Asessment/Plan--- Left knee recurrent patella tendon tear- - Plan left knee extensor mechanism reconstruction with possible allograft. Procedure risks and potential comps discussed with patient who elects to proceed. Goals are decreased pain and increased function with a high likelihood of achieving both

## 2014-06-26 ENCOUNTER — Encounter (HOSPITAL_COMMUNITY): Payer: Self-pay | Admitting: Orthopedic Surgery

## 2014-06-26 MED ORDER — ESOMEPRAZOLE MAGNESIUM 40 MG PO CPDR
40.0000 mg | DELAYED_RELEASE_CAPSULE | Freq: Two times a day (BID) | ORAL | Status: DC
  Administered 2014-06-26 – 2014-06-28 (×5): 40 mg via ORAL
  Filled 2014-06-26 (×8): qty 1

## 2014-06-26 NOTE — Progress Notes (Signed)
Physical Therapy Treatment Patient Details Name: [name] [name] MRN: [id number] DOB: January 01, 1935 Today's Date: 06/26/2014    History of Present Illness L patella tendon reconstruction; 3rd repair since January per pt    PT Comments    Steady progress with mobility.  Pt continues to c/o mild dizziness with ambulation - BP 106/55.  Follow Up Recommendations  SNF     Equipment Recommendations  Rolling walker with 5" wheels    Recommendations for Other Services       Precautions / Restrictions Precautions Precautions: Fall Required Braces or Orthoses: Knee Immobilizer - Left Knee Immobilizer - Left: On at all times Restrictions Weight Bearing Restrictions: No Other Position/Activity Restrictions: WBAT - WITH KI IN PLACE    Mobility  Bed Mobility Overal bed mobility: Needs Assistance Bed Mobility: Sit to Supine       Sit to supine: Min assist   General bed mobility comments: min cues for LE management and use of R LE to self assist  Transfers Overall transfer level: Needs assistance Equipment used: Rolling walker (2 wheeled) Transfers: Sit to/from Stand Sit to Stand: Min assist         General transfer comment: cues for LE management and use of UEs to self assist  Ambulation/Gait Ambulation/Gait assistance: Min assist;Min guard Ambulation Distance (Feet): 38 Feet (and 5' from [facility] to bedside) Assistive device: Rolling walker (2 wheeled) Gait Pattern/deviations: Step-to pattern;Decreased step length - right;Decreased step length - left;Shuffle;Trunk flexed Gait velocity: decr   General Gait Details: cues for sequence, posture and position from RW; lts by dizziness   Stairs            Wheelchair Mobility    Modified Rankin (Stroke Patients Only)       Balance                                    Cognition Arousal/Alertness: Awake/alert Behavior During Therapy: WFL for tasks assessed/performed Overall Cognitive Status: Within  Functional Limits for tasks assessed                      Exercises General Exercises - Lower Extremity Ankle Circles/Pumps: AROM;Both;15 reps;Supine    General Comments        Pertinent Vitals/Pain Pain Assessment: 0-10 Pain Score: 7  Pain Location: L knee Pain Descriptors / Indicators: Aching;Sore Pain Intervention(s): Limited activity within patient's tolerance;Monitored during session;Premedicated before session;Ice applied    Home Living                      Prior Function            PT Goals (current goals can now be found in the care plan section) Acute Rehab PT Goals Patient Stated Goal: This needs to be my last surgery on this leg PT Goal Formulation: With patient Time For Goal Achievement: 07/03/14 Potential to Achieve Goals: Good Progress towards PT goals: Progressing toward goals    Frequency  Min 5X/week    PT Plan Current plan remains appropriate    Co-evaluation             End of Session Equipment Utilized During Treatment: Gait belt;Left knee immobilizer Activity Tolerance: Patient tolerated treatment well Patient left: in bed;with call bell/phone within reach     Time: 1345-1410 PT Time Calculation (min) (ACUTE ONLY): 25 min  Charges:  $Gait Training: 8-22 mins $Therapeutic  Activity: 8-22 mins                    G Codes:      Oliver Neuwirth 07/18/2014, 3:08 PM

## 2014-06-26 NOTE — Clinical Social Work Note (Signed)
Clinical Social Work Assessment  Patient Details  Name: [name] [name] MRN: [id number] Date of Birth: 10/07/1934  Date of referral:  06/26/14               Reason for consult:  Discharge Planning                Permission sought to share information with:    Permission granted to share information::     Name::        Agency::     Relationship::     Contact Information:     Housing/Transportation Living arrangements for the past 2 months:  Single Family Home Source of Information:  Patient Patient Interpreter Needed:  None Criminal Activity/Legal Involvement Pertinent to Current Situation/Hospitalization:  No - Comment as needed Significant Relationships:  Adult Children, Spouse Lives with:  Spouse Do you feel safe going back to the place where you live?   (SNF placement may be needed.) Need for family participation in patient care:  No (Coment)  Care giving concerns:  Pt's care may not be able to be managed at home following hospital d/c.   Social Worker assessment / plan:  Pt hospitalized on 06/25/14 for a left patella tendon repair. Spoke with PA and PN reviewed. CSW met with pt to assist with d/c planning. St Rehab may be needed at d/c. Pt is willing to consider this option. SNF search initiated and bed offers are pending.   Employment status:  Retired Forensic scientist:  Medicare PT Recommendations:  Not assessed at this time [place] / Referral to community resources:  [place]  Patient/Family's Response to care: Pt is willing to consider ST Rehab placement. Patient/Family's Understanding of and Emotional Response to Diagnosis, Current Treatment, and Prognosis:  Pt feels ST Rehab may be needed. She has been to rehab in the past and her experience has varied. Pt will follow MD/PT recommendations. Pt is frustrated with her multiple hospitalizations and is looking forward to feeling better.  Emotional Assessment Appearance:  Appears stated  age Attitude/Demeanor/Rapport:  Other (cooperative) Affect (typically observed):  Pleasant, Calm Orientation:  Oriented to Self, Oriented to Place, Oriented to  Time, Oriented to Situation Alcohol / Substance use:  Not Applicable Psych involvement (Current and /or in the community):  No (Comment)  Discharge Needs  Concerns to be addressed:  Discharge Planning Concerns Readmission within the last 30 days:    Current discharge risk:  None Barriers to Discharge:  No Barriers Identified   Sherylann Vangorden, Randall An, LCSW 06/26/2014, 1:54 PM

## 2014-06-26 NOTE — Evaluation (Signed)
Physical Therapy Evaluation Patient Details Name: [name] [name] MRN: [id number] DOB: 06-17-1934 Today's Date: 06/26/2014   History of Present Illness  L patella tendon reconstruction; 3rd repair since January per pt  Clinical Impression  Pt s/p L patella tendon repair presents with decreased L LE strength/ROM and post op pain limiting functional mobility.   This is pt's third surgery and she would benefit from short term SNF level rehab to maximize IND and safety prior to return home with ltd assist.    Follow Up Recommendations SNF    Equipment Recommendations  Rolling walker with 5" wheels    Recommendations for Other Services       Precautions / Restrictions Precautions Precautions: Fall Required Braces or Orthoses: Knee Immobilizer - Left Knee Immobilizer - Left: On at all times Restrictions Weight Bearing Restrictions: No Other Position/Activity Restrictions: WBAT - WITH KI IN PLACE      Mobility  Bed Mobility Overal bed mobility: Needs Assistance Bed Mobility: Supine to Sit     Supine to sit: Min assist     General bed mobility comments: min cues for LE management and use of R LE to self assist  Transfers Overall transfer level: Needs assistance Equipment used: Rolling walker (2 wheeled) Transfers: Sit to/from Stand Sit to Stand: Min assist         General transfer comment: cues for LE management and use of UEs to self assist  Ambulation/Gait Ambulation/Gait assistance: Min assist Ambulation Distance (Feet): 8 Feet Assistive device: Rolling walker (2 wheeled) Gait Pattern/deviations: Step-to pattern;Decreased step length - right;Decreased step length - left;Shuffle;Trunk flexed Gait velocity: decr   General Gait Details: cues for sequence, posture and position from RW; lts by dizziness  Stairs            Wheelchair Mobility    Modified Rankin (Stroke Patients Only)       Balance                                             Pertinent Vitals/Pain Pain Assessment: 0-10 Pain Score: 4  Pain Descriptors / Indicators: Aching Pain Intervention(s): Limited activity within patient's tolerance;Monitored during session;Premedicated before session    Home Living Family/patient expects to be discharged to:: Skilled nursing facility                      Prior Function Level of Independence: Independent         Comments: amb with walker or cane     Hand Dominance        Extremity/Trunk Assessment   Upper Extremity Assessment: Overall WFL for tasks assessed           Lower Extremity Assessment: LLE deficits/detail      Cervical / Trunk Assessment: Normal  Communication   Communication: No difficulties  Cognition Arousal/Alertness: Awake/alert Behavior During Therapy: WFL for tasks assessed/performed Overall Cognitive Status: Within Functional Limits for tasks assessed                      General Comments      Exercises General Exercises - Lower Extremity Ankle Circles/Pumps: AROM;Both;15 reps;Supine      Assessment/Plan    PT Assessment Patient needs continued PT services  PT Diagnosis Difficulty walking   PT Problem List Decreased strength;Decreased range of motion;Decreased activity tolerance;Decreased mobility;Decreased knowledge of use  of DME;Obesity;Pain  PT Treatment Interventions DME instruction;Gait training;Stair training;Functional mobility training;Therapeutic activities;Therapeutic exercise;Patient/family education   PT Goals (Current goals can be found in the Care Plan section) Acute Rehab PT Goals Patient Stated Goal: This needs to be my last surgery on this leg PT Goal Formulation: With patient Time For Goal Achievement: 07/03/14 Potential to Achieve Goals: Good    Frequency Min 5X/week   Barriers to discharge Decreased caregiver support      Co-evaluation               End of Session Equipment Utilized During Treatment: Gait  belt;Left knee immobilizer Activity Tolerance: Patient tolerated treatment well;Other (comment) (dizziness with mobility) Patient left: in chair;with call bell/phone within reach Nurse Communication: Mobility status         Time: 0940-1005 PT Time Calculation (min) (ACUTE ONLY): 25 min   Charges:   PT Evaluation $Initial PT Evaluation Tier I: 1 Procedure PT Treatments $Gait Training: 8-22 mins   PT G Codes:        Townes Fuhs 2014/07/16, 12:56 PM

## 2014-06-26 NOTE — Clinical Social Work Placement (Signed)
   CLINICAL SOCIAL WORK PLACEMENT  NOTE  Date:  06/26/2014  Patient Details  Name: MALANEY MCBEAN MRN: [id number] Date of Birth: 02-09-1934  Clinical Social Work is seeking post-discharge placement for this patient at the [place] level of care (*CSW will initial, date and re-position this form in  chart as items are completed):  Yes   Patient/family provided with [place] Work Department's list of facilities offering this level of care within the geographic area requested by the patient (or if unable, by the patient's family).  Yes   Patient/family informed of their freedom to choose among providers that offer the needed level of care, that participate in Medicare, Medicaid or managed care program needed by the patient, have an available bed and are willing to accept the patient.  Yes   Patient/family informed of [facility]'s ownership interest in [facility] and [facility], as well as of the fact that they are under no obligation to receive care at these facilities.  PASRR submitted to EDS on       PASRR number received on       Existing PASRR number confirmed on 06/26/14     FL2 transmitted to all facilities in geographic area requested by pt/family on 06/26/14     FL2 transmitted to all facilities within larger geographic area on       Patient informed that his/her managed care company has contracts with or will negotiate with certain facilities, including the following:            Patient/family informed of bed offers received.  Patient chooses bed at       Physician recommends and patient chooses bed at      Patient to be transferred to   on  .  Patient to be transferred to facility by       Patient family notified on   of transfer.  Name of family member notified:        PHYSICIAN       Additional Comment:    _______________________________________________ Luretha Rued, LCSW 06/26/2014, 2:02 PM

## 2014-06-26 NOTE — Progress Notes (Signed)
   Subjective: 1 Day Post-Op Procedure(s) (LRB): LEFT PATELLA TENDON REPAIR (Left) Patient reports pain as mild.   Patient seen in rounds with Dr. Wynelle Link.  Doing fairly well this morning. Patient is well, and has had no acute complaints or problems We will start therapy today.  She can be WBAT but NO BENDING or MOTION Plan is to go Rehab versus Home after hospital stay.  Objective: Vital signs in last 24 hours: Temp:  [97.6 F (36.4 C)-98.4 F (36.9 C)] 97.6 F (36.4 C) (06/09 0530) Pulse Rate:  [60-104] 60 (06/09 0530) Resp:  [14-20] 16 (06/09 0530) BP: (101-133)/(38-79) 102/48 mmHg (06/09 0530) SpO2:  [95 %-100 %] 96 % (06/09 0530) Weight:  [83.462 kg (184 lb)] 83.462 kg (184 lb) (06/08 1343)  Intake/Output from previous day: 06/08 0701 - 06/09 0700 In: 2425 [P.O.:360; I.V.:1965; IV Piggyback:100] Out: 525 [Urine:500; Blood:25] Intake/Output this shift: Total I/O In: 240 [P.O.:240] Out: -    Recent Labs  06/23/14 1500  HGB 11.1*    Recent Labs  06/23/14 1500  WBC 5.0  RBC 4.70  HCT 36.0  PLT 297    Recent Labs  06/23/14 1500  NA 143  K 3.9  CL 108  CO2 27  BUN 16  CREATININE 1.06*  GLUCOSE 124*  CALCIUM 9.1   No results for input(s): LABPT, INR in the last 72 hours.  EXAM General - Patient is Alert, Appropriate and Oriented Extremity - Neurovascular intact Sensation intact distally Dorsiflexion/Plantar flexion intact Dressing - dressing C/D/I Motor Function - intact, moving foot and toes well on exam.   Past Medical History  Diagnosis Date  . Palpitations   . Esophageal reflux   . Depressive disorder, not elsewhere classified   . Anxiety state, unspecified   . Diaphragmatic hernia without mention of obstruction or gangrene   . External hemorrhoids without mention of complication   . Diverticulosis of colon (without mention of hemorrhage)   . Unspecified vitamin D deficiency   . Helicobacter pylori (H. pylori)   . Macular degeneration  (senile) of retina, unspecified   . Shortness of breath 08-11-11    with exertion only at present  . Osteoarthrosis, unspecified whether generalized or localized, unspecified site 08-11-11    hx. "rhematoid arthritis", osteoarthritis, DDD, bursitis(hip)  . Lumbar spondylosis   . Complication of anesthesia     slow to wake up with surgery several years ago   . PONV (postoperative nausea and vomiting)   . Hiatal hernia   . Schatzki's ring   . Unspecified cerebral artery occlusion with cerebral infarction 08-11-11    '97/ '06( TIA)-affected lt. side, slight weakness L side  . Osteoporosis   . Alternating constipation and diarrhea   . Frequency of urination   . History of transfusion   . Difficulty sleeping   . Fatigue     Assessment/Plan: 1 Day Post-Op Procedure(s) (LRB): LEFT PATELLA TENDON REPAIR (Left) Active Problems:   Patellar tendon rupture  Estimated body mass index is 31.11 kg/(m^2) as calculated from the following:   Height as of this encounter: 5' 4.5" (1.638 m).   Weight as of this encounter: 83.462 kg (184 lb). Advance diet Up with therapy Plan for discharge tomorrow possibly to home or rehab  DVT Prophylaxis - Aspirin and Plavix Weight-Bearing as tolerated to left leg D/C O2 and Pulse OX and try on Room Air  Arlee Muslim, PA-C Orthopaedic Surgery 06/26/2014, 7:54 AM

## 2014-06-26 NOTE — Op Note (Signed)
NAMEBRYN, [name]             ACCOUNT NO.:  [id number]  MEDICAL RECORD NO.:  [id number]  LOCATION:  5329                         FACILITY:  [facility]  PHYSICIAN:  Gaynelle Arabian, M.D.    DATE OF BIRTH:  1934-07-18  DATE OF PROCEDURE:  06/25/2014 DATE OF DISCHARGE:                              OPERATIVE REPORT   PREOPERATIVE DIAGNOSIS:  Recurrent left patellar tendon rupture.  POSTOPERATIVE DIAGNOSIS:  Recurrent left patellar tendon rupture.  PROCEDURE:  Left patella tendon reconstruction.  SURGEON:  Gaynelle Arabian, M.D.  ASSISTANT:  Alexzandrew L. Perkins, P.A.C.  ANESTHESIA:  General.  ESTIMATED BLOOD LOSS:  Minimal.  DRAINS:  None.  TOURNIQUET TIME:  44 minutes at 300 mmHg.  COMPLICATIONS:  None.  CONDITION:  Stable to recovery room.  BRIEF CLINICAL NOTE:  [name] [name] is a 80 year old female, who had a left total knee arthroplasty done several months ago, fell about 3-4 weeks postop sustaining an inferior pole patella fracture.  This was treated with excision of fragment of patella tendon repair.  She did very well initially and was noted approximately 3 weeks to 4 weeks ago that her patella was migrating proximally.  It was felt as though the repair had torn.  She presents now for reconstruction with possible allograft.  PROCEDURE IN DETAIL:  After successful administration of general anesthetic, a tourniquet was placed on the left thigh.  Left lower extremity was prepped and draped in usual sterile fashion.  Extremity was wrapped in an Esmarch and tourniquet inflated 300 mmHg.  A midline incision was made with a 10 blade through subcutaneous tissue to the level of the extensor mechanism.  Dissection was performed medial and lateral to show the entire extensor mechanism.  She still had tissue present attached to the tibial tubercle and attached to the inferior pole of the patella, the tissue was rather robust but just centrally in between was thin.  The patella  was sitting prior about 2 inches above its normal height.  I had made a transverse incision in the weakest portion of that tissue and noted that the tissue attached to the inferior pole of the patella and tissue attached to the tibial tubercle was excellent.  Instead of using a graft, I decided to repair this by advancing the tissue attached to the inferior pole of the patella down to the tibial tubercle and then over-sewing via the tissue on top of this to bolster the repair.  #2 FiberWire sutures were then passed starting superior to patella and in [place] weave down inferiorly and through the proximal tissue.  I did one medial and one lateral.  Similarly, I started tibial tubercle and did one medial and one lateral in the distal tissue starting tibial tubercle and coursing towards the area where we made the transverse incision.  I then passed the proximal tissue below, the distal tissue down almost to the level of the tubercle and got the patella back within 1 cm of its normal position.  I actually advanced the patella about 2 inches compared to where it was sitting previously.  Prior to tying this down, we thoroughly irrigated the joint with about a liter of [name] solution.  I then passed  the sutures under the distal tissue at the level of the tibial tubercle and through that tissue.  We tied those sutures to each other effectively keeping the patella in the reduced position.  We then took the sutures from the distal portion, advanced that up as far proximal as possible and passed those through the proximal tissue and back again through the distal tissue, tied those off which again bolstered repair.  We then weaved the remaining suture through the medial and lateral borders of the repair to further bolster this and then tied the sutures to each other.  I was very pleased with repair, and she was able to flex it to about 70 degrees without any excess tension on repair.  I then over  sewed the edges of the repair centrally with Ethibond suture.  The wound was again irrigated and the medial arthrotomy and lateral portion of the transverse arthrotomy are closed with interrupted #1 Ethibond suture.  Tourniquet then released, total time of 44 minutes.  Subcu was closed with interrupted 2-0 Vicryl and subcuticular running 4-0 Monocryl.  The incision was cleaned and dried and Steri-Strips and a bulky sterile dressing applied.  She was placed into a knee immobilizer, awakened and transported to recovery in stable condition.  Note that a surgical assistance was a medical necessity for this procedure to assist in the suturing technique which requires 2 people and also to protect the repair and advance the patella while we were doing the repair.     Gaynelle Arabian, M.D.     FA/MEDQ  D:  06/25/2014  T:  06/26/2014  Job:  [id number]

## 2014-06-27 ENCOUNTER — Encounter: Payer: Self-pay | Admitting: Family Medicine

## 2014-06-27 ENCOUNTER — Encounter: Payer: Self-pay | Admitting: *Deleted

## 2014-06-27 MED ORDER — ASPIRIN 325 MG PO TABS: 325.0000 mg | ORAL_TABLET | Freq: Every day | ORAL | Status: DC

## 2014-06-27 MED ORDER — POLYETHYLENE GLYCOL 3350 17 G PO PACK: 17.0000 g | PACK | Freq: Every day | ORAL | Status: DC | PRN

## 2014-06-27 MED ORDER — BISACODYL 10 MG RE SUPP: 10.0000 mg | Freq: Every day | RECTAL | Status: DC | PRN

## 2014-06-27 MED ORDER — MENTHOL 3 MG MT LOZG
1.0000 | LOZENGE | OROMUCOSAL | Status: DC | PRN
  Filled 2014-06-27: qty 9

## 2014-06-27 MED ORDER — METHOCARBAMOL 500 MG PO TABS: 500.0000 mg | ORAL_TABLET | Freq: Four times a day (QID) | ORAL | Status: DC | PRN

## 2014-06-27 MED ORDER — METOCLOPRAMIDE HCL 5 MG PO TABS
5.0000 mg | ORAL_TABLET | Freq: Three times a day (TID) | ORAL | Status: DC | PRN
Start: 2014-06-27 — End: 2015-06-04

## 2014-06-27 MED ORDER — ONDANSETRON HCL 4 MG PO TABS: 4.0000 mg | ORAL_TABLET | Freq: Four times a day (QID) | ORAL | Status: DC | PRN

## 2014-06-27 MED ORDER — OXYCODONE HCL 5 MG PO TABS: 5.0000 mg | ORAL_TABLET | ORAL | Status: DC | PRN

## 2014-06-27 NOTE — Discharge Summary (Signed)
Physician Discharge Summary   Patient ID: [name] [name] MRN: [id number] DOB/AGE: 1934-09-15 80 y.o.  Admit date: 06/25/2014 Discharge date:  Tentative Date of Discharge - Saturday - 06/28/2014  Primary Diagnosis:  Recurrent left patellar tendon rupture.  Admission Diagnoses:  Past Medical History  Diagnosis Date  . Palpitations   . Esophageal reflux   . Depressive disorder, not elsewhere classified   . Anxiety state, unspecified   . Diaphragmatic hernia without mention of obstruction or gangrene   . External hemorrhoids without mention of complication   . Diverticulosis of colon (without mention of hemorrhage)   . Unspecified vitamin D deficiency   . Helicobacter pylori (H. pylori)   . Macular degeneration (senile) of retina, unspecified   . Shortness of breath 08-11-11    with exertion only at present  . Osteoarthrosis, unspecified whether generalized or localized, unspecified site 08-11-11    hx. "rhematoid arthritis", osteoarthritis, DDD, bursitis(hip)  . Lumbar spondylosis   . Complication of anesthesia     slow to wake up with surgery several years ago   . PONV (postoperative nausea and vomiting)   . Hiatal hernia   . Schatzki's ring   . Unspecified cerebral artery occlusion with cerebral infarction 08-11-11    '97/ '06( TIA)-affected lt. side, slight weakness L side  . Osteoporosis   . Alternating constipation and diarrhea   . Frequency of urination   . History of transfusion   . Difficulty sleeping   . Fatigue    Discharge Diagnoses:   Active Problems:   Patellar tendon rupture  Estimated body mass index is 31.11 kg/(m^2) as calculated from the following:   Height as of this encounter: 5' 4.5" (1.638 m).   Weight as of this encounter: 83.462 kg (184 lb).  Procedure:  Procedure(s) (LRB): LEFT PATELLA TENDON REPAIR (Left)   Consults: None  HPI: [name] [name] is a 80 year old female, who had a left total knee arthroplasty done several months ago, fell about  3-4 weeks postop sustaining an inferior pole patella fracture. This was treated with excision of fragment of patella tendon repair. She did very well initially and was noted approximately 3 weeks to 4 weeks ago that her patella was migrating proximally. It was felt as though the repair had torn. She presents now for reconstruction with possible Allograft.  Laboratory Data: Hospital Outpatient Visit on 06/23/2014  Component Date Value Ref Range Status  . WBC 06/23/2014 5.0  4.0 - 10.5 K/uL Final  . RBC 06/23/2014 4.70  3.87 - 5.11 MIL/uL Final  . Hemoglobin 06/23/2014 11.1* 12.0 - 15.0 g/dL Final  . HCT 06/23/2014 36.0  36.0 - 46.0 % Final  . MCV 06/23/2014 76.6* 78.0 - 100.0 fL Final  . MCH 06/23/2014 23.6* 26.0 - 34.0 pg Final  . MCHC 06/23/2014 30.8  30.0 - 36.0 g/dL Final  . RDW 06/23/2014 18.6* 11.5 - 15.5 % Final  . Platelets 06/23/2014 297  150 - 400 K/uL Final  . Sodium 06/23/2014 143  135 - 145 mmol/L Final  . Potassium 06/23/2014 3.9  3.5 - 5.1 mmol/L Final  . Chloride 06/23/2014 108  101 - 111 mmol/L Final  . CO2 06/23/2014 27  22 - 32 mmol/L Final  . Glucose, Bld 06/23/2014 124* 65 - 99 mg/dL Final  . BUN 06/23/2014 16  6 - 20 mg/dL Final  . Creatinine, Ser 06/23/2014 1.06* 0.44 - 1.00 mg/dL Final  . Calcium 06/23/2014 9.1  8.9 - 10.3 mg/dL Final  . GFR calc  non Af Amer 06/23/2014 49* >60 mL/min Final  . GFR calc Af Amer 06/23/2014 57* >60 mL/min Final   Comment: (NOTE) The eGFR has been calculated using the CKD EPI equation. This calculation has not been validated in all clinical situations. eGFR's persistently <60 mL/min signify possible Chronic Kidney Disease.   . Anion gap 06/23/2014 8  5 - 15 Final  Abstract on 06/17/2014  Component Date Value Ref Range Status  . Fecal Occult Blood 06/13/2014 Negative   Final  Appointment on 06/13/2014  Component Date Value Ref Range Status  . Fecal Occult Bld 06/13/2014 Negative  Negative Final  Lab on 05/28/2014    Component Date Value Ref Range Status  . WBC 05/28/2014 4.9  4.0 - 10.5 K/uL Final  . RBC 05/28/2014 5.07  3.87 - 5.11 Mil/uL Final  . Hemoglobin 05/28/2014 11.8* 12.0 - 15.0 g/dL Final  . HCT 05/28/2014 36.7  36.0 - 46.0 % Final  . MCV 05/28/2014 72.5* 78.0 - 100.0 fl Final  . MCHC 05/28/2014 32.2  30.0 - 36.0 g/dL Final  . RDW 05/28/2014 18.8* 11.5 - 15.5 % Final  . Platelets 05/28/2014 344.0  150.0 - 400.0 K/uL Final  . Neutrophils Relative % 05/28/2014 48.3  43.0 - 77.0 % Final  . Lymphocytes Relative 05/28/2014 37.6  12.0 - 46.0 % Final  . Monocytes Relative 05/28/2014 9.9  3.0 - 12.0 % Final  . Eosinophils Relative 05/28/2014 3.1  0.0 - 5.0 % Final  . Basophils Relative 05/28/2014 1.1  0.0 - 3.0 % Final  . Neutro Abs 05/28/2014 2.4  1.4 - 7.7 K/uL Final  . Lymphs Abs 05/28/2014 1.8  0.7 - 4.0 K/uL Final  . Monocytes Absolute 05/28/2014 0.5  0.1 - 1.0 K/uL Final  . Eosinophils Absolute 05/28/2014 0.2  0.0 - 0.7 K/uL Final  . Basophils Absolute 05/28/2014 0.1  0.0 - 0.1 K/uL Final  . Sodium 05/28/2014 139  135 - 145 mEq/L Final  . Potassium 05/28/2014 4.2  3.5 - 5.1 mEq/L Final  . Chloride 05/28/2014 104  96 - 112 mEq/L Final  . CO2 05/28/2014 28  19 - 32 mEq/L Final  . Glucose, Bld 05/28/2014 97  70 - 99 mg/dL Final  . BUN 05/28/2014 14  6 - 23 mg/dL Final  . Creatinine, Ser 05/28/2014 0.81  0.40 - 1.20 mg/dL Final  . Total Bilirubin 05/28/2014 0.4  0.2 - 1.2 mg/dL Final  . Alkaline Phosphatase 05/28/2014 82  39 - 117 U/L Final  . AST 05/28/2014 19  0 - 37 U/L Final  . ALT 05/28/2014 11  0 - 35 U/L Final  . Total Protein 05/28/2014 7.2  6.0 - 8.3 g/dL Final  . Albumin 05/28/2014 4.0  3.5 - 5.2 g/dL Final  . Calcium 05/28/2014 9.7  8.4 - 10.5 mg/dL Final  . GFR 05/28/2014 72.80  >60.00 mL/min Final  . Cholesterol 05/28/2014 173  0 - 200 mg/dL Final   ATP III Classification       Desirable:  < 200 mg/dL               Borderline High:  200 - 239 mg/dL          High:  > = 240  mg/dL  . Triglycerides 05/28/2014 114.0  0.0 - 149.0 mg/dL Final   Normal:  <150 mg/dLBorderline High:  150 - 199 mg/dL  . HDL 05/28/2014 69.90  >39.00 mg/dL Final  . VLDL 05/28/2014 22.8  0.0 - 40.0 mg/dL Final  .  LDL Cholesterol 05/28/2014 80  0 - 99 mg/dL Final  . Total CHOL/HDL Ratio 05/28/2014 2   Final                  Men          Women1/2 Average Risk     3.4          3.3Average Risk          5.0          4.42X Average Risk          9.6          7.13X Average Risk          15.0          11.0                      . NonHDL 05/28/2014 103.10   Final   NOTE:  Non-HDL goal should be 30 mg/dL higher than patient's LDL goal (i.e. LDL goal of < 70 mg/dL, would have non-HDL goal of < 100 mg/dL)  . TSH 05/28/2014 2.56  0.35 - 4.50 uIU/mL Final  . VITD 05/28/2014 44.47  30.00 - 100.00 ng/mL Final     X-Rays:Dg Bone Density  06/24/2014   Date of study:  06/23/2014 Exam: DUAL X-RAY ABSORPTIOMETRY (DXA) FOR BONE MINERAL DENSITY (BMD) Instrument: [employer] Requesting Provider: PCP Indication: follow up for low BMD Comparison:  02/20/2012 Clinical data: Pt is a postmenopausal 80 y.o. female with previous h/o  fracture. On calcium and Evista  Results:  Lumbar spine (L1-L4) Femoral neck (FN) Ultra distal radius ( nondominant  arm)  T-score  -0.8 RFN: -1.8 LFN: -2.1  -2.7  Change in BMD from previous DXA test (%)  +1.1%  -3.9% n/a  (*) statistically significant  Assessment: the BMD is low according to the [facility] classification for  osteoporosis (see below). Fracture risk: moderate FRAX score: 10 year major osteoporotic risk: 14.8%. 10 year hip fracture  risk: 4.3%. The thresholds for treatment  our 20% and 3%, respectively. Comments: the technical quality of the study is good.  L4 vertebra could  not be analyzed due to previous surgery and instrumentation. Evaluation for secondary causes should be considered if clinically  indicated.  Recommend optimizing calcium (1200 mg/day) and vitamin D (800 IU/day)   intake.  Followup: Repeat BMD is appropriate after 2 years or after 1-2 years if  starting treatment.  WHO criteria for diagnosis of osteoporosis in postmenopausal women and in  men 63 y/o or older:  - normal: T-score -1.0 to + 1.0 - osteopenia/low bone density: T-score between -2.5 and -1.0 - osteoporosis: T-score below -2.5 - severe osteoporosis: T-score below -2.5 with history of fragility  fracture Note: although not part of the WHO classification, the presence of a  fragility fracture, regardless of the T-score, should be considered  diagnostic of osteoporosis, provided other causes for the fracture have  been excluded.  Treatment: The National Osteoporosis Foundation recommends that treatment  be considered in postmenopausal women and men age 39 or older with: 1. Hip or vertebral (clinical or morphometric) fracture 2. T-score of - 2.5 or lower at the spine or hip 3. 10-year fracture probability by FRAX of at least 20% for a major  osteoporotic fracture and 3% for a hip fracture  Philemon Kingdom, MD [facility]       EKG: Orders placed or performed in visit on 02/23/14  . EKG 12-Lead  .  EKG 12-Lead     Hospital Course: [name] [name] is a 80 y.o. who was admitted to [facility]. They were brought to the operating room on 06/25/2014 and underwent Procedure(s): [place].  Patient tolerated the procedure well and was later transferred to the recovery room and then to the orthopaedic floor for postoperative care.  They were given PO and IV analgesics for pain control following their surgery.  They were given 24 hours of postoperative antibiotics of  Anti-infectives    Start     Dose/Rate Route Frequency Ordered Stop   06/26/14 0600  ceFAZolin (ANCEF) IVPB 2 g/50 mL premix     2 g 100 mL/hr over 30 Minutes Intravenous On call to O.R. 06/25/14 1320 06/25/14 1630   06/25/14 2200  ceFAZolin (ANCEF) IVPB 2 g/50 mL premix     2 g 100 mL/hr over 30 Minutes  Intravenous Every 6 hours 06/25/14 1851 06/26/14 0918     and started back onto her home blood thiners in the form of Aspirin and Plavix.   PT and OT were ordered for therapy postop.  Discharge planning consulted to help with postop disposition and equipment needs.  Social worker consulted to assist with possible placement of the patient  Patient had a decent night on the evening of surgery and doing fairly well on POD 1.  They started to get up OOB with therapy on day one. She was only able to walk 8 feet and it was felt then that she would likely benefit from a SNF stay.  Continued to work with therapy into day two.  Dressing was changed on day two and the incision was healing well.  She had a fair amount of pain on day two following surgery.  It was felt that she would need a short stay at a SNFand that they would probably be ready to transfer the following day, Saturday 06/28/2014.  The patient will evaluated by the weekend coverage staff and will discharge if doing well.  This summary was prepared in anticipation of the the patient's transfer over the weekend.  Diet: Regular diet Activity:WBAT, No bending or motion to the left knee.  Follow-up:in 2 weeks Disposition - Skilled nursing facility Discharged Condition: Pending at time of summary, Transfer on Saturday if doing well on weekend rounds.   Discharge Instructions    Call MD / Call 911    Complete by:  As directed   If you experience chest pain or shortness of breath, CALL 911 and be transported to the hospital emergency room.  If you develope a fever above 101 F, pus (white drainage) or increased drainage or redness at the wound, or calf pain, call your surgeon's office.     Change dressing    Complete by:  As directed   Change dressing daily with sterile 4 x 4 inch gauze dressing and apply TED hose. Do not submerge the incision under water.     Constipation Prevention    Complete by:  As directed   Drink plenty of fluids.  Prune juice  may be helpful.  You may use a stool softener, such as Colace (over the counter) 100 mg twice a day.  Use MiraLax (over the counter) for constipation as needed.     Diet - low sodium heart healthy    Complete by:  As directed      Discharge instructions    Complete by:  As directed   Pick up stool softner  and laxative for home use following surgery while on pain medications. Do not submerge incision under water. Please use good hand washing techniques while changing dressing each day. May shower starting three days after surgery. Please use a clean towel to pat the incision dry following showers. Continue to use ice for pain and swelling after surgery. Do not use any lotions or creams on the incision until instructed by your surgeon.  Patient is back on her Aspirin and Plavix daily.  Postoperative Constipation Protocol  Constipation - defined medically as fewer than three stools per week and severe constipation as less than one stool per week.  One of the most common issues patients have following surgery is constipation.  Even if you have a regular bowel pattern at home, your normal regimen is likely to be disrupted due to multiple reasons following surgery.  Combination of anesthesia, postoperative narcotics, change in appetite and fluid intake all can affect your bowels.  In order to avoid complications following surgery, here are some recommendations in order to help you during your recovery period.  Colace (docusate) - Pick up an over-the-counter form of Colace or another stool softener and take twice a day as long as you are requiring postoperative pain medications.  Take with a full glass of water daily.  If you experience loose stools or diarrhea, hold the colace until you stool forms back up.  If your symptoms do not get better within 1 week or if they get worse, check with your doctor.  Dulcolax (bisacodyl) - Pick up over-the-counter and take as directed by the product packaging as  needed to assist with the movement of your bowels.  Take with a full glass of water.  Use this product as needed if not relieved by Colace only.   MiraLax (polyethylene glycol) - Pick up over-the-counter to have on hand.  MiraLax is a solution that will increase the amount of water in your bowels to assist with bowel movements.  Take as directed and can mix with a glass of water, juice, soda, coffee, or tea.  Take if you go more than two days without a movement. Do not use MiraLax more than once per day. Call your doctor if you are still constipated or irregular after using this medication for 7 days in a row.  If you continue to have problems with postoperative constipation, please contact the office for further assistance and recommendations.  If you experience "the worst abdominal pain ever" or develop nausea or vomiting, please contact the office immediatly for further recommendations for treatment.  When discharged from the skilled rehab facility, please have the facility set up the patient's [place] prior to being released.  Please make sure this gets set up prior to release in order to avoid any lapse of therapy following the rehab stay.  Also provide the patient with their medications at time of release from the facility to include their pain medication, the muscle relaxants, and their blood thinner medication.  If the patient is still at the rehab facility at time of follow up appointment, please also assist the patient in arranging follow up appointment in our office and any transportation needs. ICE to the affected knee or hip every three hours for 30 minutes at a time and then as needed for pain and swelling.  NO BENDING OR MOTION TO THE LEFT KNEE. KNEE IMMOBILIZER AT ALL TIMES EXCEPT FOR SHOWER AND DRESSING CHANGES     Do not sit on low  chairs, stoools or toilet seats, as it may be difficult to get up from low surfaces    Complete by:  As directed      Driving  restrictions    Complete by:  As directed   No driving until released by the physician.     Increase activity slowly as tolerated    Complete by:  As directed   Knee Immobilizer At All Times No Bending or Motion to the Left Knee     Lifting restrictions    Complete by:  As directed   No lifting until released by the physician.     Patient may shower    Complete by:  As directed   You may shower without a dressing once there is no drainage.  Do not wash over the wound.  If drainage remains, do not shower until drainage stops.     TED hose    Complete by:  As directed   Use stockings (TED hose) for 3 weeks on both leg(s).  You may remove them at night for sleeping.     Weight bearing as tolerated    Complete by:  As directed   Laterality:  left  Extremity:  Lower            Medication List    TAKE these medications        acetaminophen 325 MG tablet  Commonly known as:  TYLENOL  Take 2 tablets (650 mg total) by mouth every 6 (six) hours as needed for mild pain (or Fever >/= 101).     ALPRAZolam 0.5 MG tablet  Commonly known as:  XANAX  Take 1 tablet (0.5 mg total) by mouth at bedtime as needed for sleep.     aspirin 325 MG tablet  Take 1 tablet (325 mg total) by mouth daily.     atorvastatin 10 MG tablet  Commonly known as:  LIPITOR  Take 1 tablet (10 mg total) by mouth daily.     bisacodyl 10 MG suppository  Commonly known as:  DULCOLAX  Place 1 suppository (10 mg total) rectally daily as needed for moderate constipation.     calcium carbonate 500 MG chewable tablet  Commonly known as:  TUMS - dosed in mg elemental calcium  Chew 1 tablet (200 mg of elemental calcium total) by mouth 4 (four) times daily as needed for indigestion or heartburn.     clopidogrel 75 MG tablet  Commonly known as:  PLAVIX  TAKE 1 TABLET DAILY     DSS 100 MG Caps  Take 100 mg by mouth 2 (two) times daily.     esomeprazole 40 MG capsule  Commonly known as:  NEXIUM  Take 1 capsule (40  mg total) by mouth 2 (two) times daily.     methocarbamol 500 MG tablet  Commonly known as:  ROBAXIN  Take 1 tablet (500 mg total) by mouth every 6 (six) hours as needed for muscle spasms.     metoCLOPramide 5 MG tablet  Commonly known as:  REGLAN  Take 1 tablet (5 mg total) by mouth every 8 (eight) hours as needed for nausea (if ondansetron (ZOFRAN) ineffective.).     ondansetron 4 MG tablet  Commonly known as:  ZOFRAN  Take 1 tablet (4 mg total) by mouth every 6 (six) hours as needed for nausea.     oxyCODONE 5 MG immediate release tablet  Commonly known as:  Oxy IR/ROXICODONE  Take 1-2 tablets (5-10 mg total) by mouth every  3 (three) hours as needed for moderate pain, severe pain or breakthrough pain.     polyethylene glycol packet  Commonly known as:  MIRALAX / GLYCOLAX  Take 17 g by mouth daily as needed for mild constipation.     raloxifene 60 MG tablet  Commonly known as:  EVISTA  Take 1 tablet (60 mg total) by mouth daily.     sertraline 100 MG tablet  Commonly known as:  ZOLOFT  Take 2 tablets (200 mg total) by mouth daily.           Follow-up Information    Follow up with Gearlean Alf, MD. Schedule an appointment as soon as possible for a visit on 07/08/2014.   Specialty:  Orthopedic Surgery   Why:  Call the office ASAP at [phone] to setup appointment on Tuesday 07/08/2014 with Dr. Wynelle Link.   Contact information:   [address] [place] [address] [phone]       Signed: Arlee Muslim, PA-C Orthopaedic Surgery 06/27/2014, 8:29 AM

## 2014-06-27 NOTE — Progress Notes (Signed)
CSW assisting with d/c planning. Pt has a rehab bed at [facility] on [address] if stable for d/c.   Werner Lean LCSW [phone]

## 2014-06-27 NOTE — Discharge Instructions (Signed)
Pick up stool softner and laxative for home use following surgery while on pain medications. Do not submerge incision under water. Please use good hand washing techniques while changing dressing each day. May shower starting three days after surgery. Please use a clean towel to pat the incision dry following showers. Continue to use ice for pain and swelling after surgery. Do not use any lotions or creams on the incision until instructed by your surgeon.  Patient is back on her Aspirin and Plavix daily.  Postoperative Constipation Protocol  Constipation - defined medically as fewer than three stools per week and severe constipation as less than one stool per week.  One of the most common issues patients have following surgery is constipation. Even if you have a regular bowel pattern at home, your normal regimen is likely to be disrupted due to multiple reasons following surgery. Combination of anesthesia, postoperative narcotics, change in appetite and fluid intake all can affect your bowels. In order to avoid complications following surgery, here are some recommendations in order to help you during your recovery period.  Colace (docusate) - Pick up an over-the-counter form of Colace or another stool softener and take twice a day as long as you are requiring postoperative pain medications. Take with a full glass of water daily. If you experience loose stools or diarrhea, hold the colace until you stool forms back up. If your symptoms do not get better within 1 week or if they get worse, check with your doctor.  Dulcolax (bisacodyl) - Pick up over-the-counter and take as directed by the product packaging as needed to assist with the movement of your bowels. Take with a full glass of water. Use this product as needed if not relieved by Colace only.   MiraLax (polyethylene glycol) - Pick up over-the-counter to have on hand. MiraLax is a solution that will increase the amount of water in  your bowels to assist with bowel movements. Take as directed and can mix with a glass of water, juice, soda, coffee, or tea. Take if you go more than two days without a movement. Do not use MiraLax more than once per day. Call your doctor if you are still constipated or irregular after using this medication for 7 days in a row.  If you continue to have problems with postoperative constipation, please contact the office for further assistance and recommendations. If you experience "the worst abdominal pain ever" or develop nausea or vomiting, please contact the office immediatly for further recommendations for treatment.  When discharged from the skilled rehab facility, please have the facility set up the patient's [place] prior to being released. Please make sure this gets set up prior to release in order to avoid any lapse of therapy following the rehab stay.  Also provide the patient with their medications at time of release from the facility to include their pain medication, the muscle relaxants, and their blood thinner medication. If the patient is still at the rehab facility at time of follow up appointment, please also assist the patient in arranging follow up appointment in our office and any transportation needs. ICE to the affected knee or hip every three hours for 30 minutes at a time and then as needed for pain and swelling.  NO BENDING OR MOTION TO THE LEFT KNEE. KNEE IMMOBILIZER AT ALL TIMES EXCEPT FOR SHOWER AND DRESSING CHANGES

## 2014-06-27 NOTE — Progress Notes (Signed)
   Subjective: 2 Days Post-Op Procedure(s) (LRB): LEFT PATELLA TENDON REPAIR (Left) Patient reports pain as moderate.   Patient seen in rounds with Dr. Wynelle Link.  Describing what sounds like spasms in the knee and leg. No Motion or Bending to the Knee.  Knee Immobilizer At All Times. Patient is having problems with pain in the knee, requiring pain medications We will start therapy today.  Plan is to go Skilled nursing facility after hospital stay. Probably tomorrow.  Will setup discharge and summary in anticipation for tomorrow.  Objective: Vital signs in last 24 hours: Temp:  [97.7 F (36.5 C)-98.6 F (37 C)] 98.6 F (37 C) (06/10 0602) Pulse Rate:  [61-96] 96 (06/10 0602) Resp:  [16] 16 (06/10 0602) BP: (92-126)/(43-54) 126/49 mmHg (06/10 0602) SpO2:  [91 %-100 %] 91 % (06/10 0602)  Intake/Output from previous day: 06/09 0701 - 06/10 0700 In: 850 [P.O.:720; I.V.:80; IV Piggyback:50] Out: 1175 [Urine:1175] Intake/Output this shift:    No results for input(s): HGB in the last 72 hours. No results for input(s): WBC, RBC, HCT, PLT in the last 72 hours. No results for input(s): NA, K, CL, CO2, BUN, CREATININE, GLUCOSE, CALCIUM in the last 72 hours. No results for input(s): LABPT, INR in the last 72 hours.  EXAM General - Patient is Alert and Appropriate Extremity - Neurovascular intact Sensation intact distally Dorsiflexion/Plantar flexion intact Incision - no drainage Motor Function - intact, moving foot and toes well on exam.   Past Medical History  Diagnosis Date  . Palpitations   . Esophageal reflux   . Depressive disorder, not elsewhere classified   . Anxiety state, unspecified   . Diaphragmatic hernia without mention of obstruction or gangrene   . External hemorrhoids without mention of complication   . Diverticulosis of colon (without mention of hemorrhage)   . Unspecified vitamin D deficiency   . Helicobacter pylori (H. pylori)   . Macular degeneration  (senile) of retina, unspecified   . Shortness of breath 08-11-11    with exertion only at present  . Osteoarthrosis, unspecified whether generalized or localized, unspecified site 08-11-11    hx. "rhematoid arthritis", osteoarthritis, DDD, bursitis(hip)  . Lumbar spondylosis   . Complication of anesthesia     slow to wake up with surgery several years ago   . PONV (postoperative nausea and vomiting)   . Hiatal hernia   . Schatzki's ring   . Unspecified cerebral artery occlusion with cerebral infarction 08-11-11    '97/ '06( TIA)-affected lt. side, slight weakness L side  . Osteoporosis   . Alternating constipation and diarrhea   . Frequency of urination   . History of transfusion   . Difficulty sleeping   . Fatigue     Assessment/Plan: 2 Days Post-Op Procedure(s) (LRB): LEFT PATELLA TENDON REPAIR (Left) Active Problems:   Patellar tendon rupture  Estimated body mass index is 31.11 kg/(m^2) as calculated from the following:   Height as of this encounter: 5' 4.5" (1.638 m).   Weight as of this encounter: 83.462 kg (184 lb). Advance diet Up with therapy Plan for discharge tomorrow Discharge to SNF  DVT Prophylaxis - Aspirin and Plavix Weight-Bearing as tolerated to left leg Knee immobilizer At All Times No Bending or Motion to the knee  Arlee Muslim, PA-C Orthopaedic Surgery 06/27/2014, 8:00 AM

## 2014-06-27 NOTE — Progress Notes (Signed)
Physical Therapy Treatment Patient Details Name: AAMINAH FORRESTER MRN: [id number] DOB: April 19, 1934 Today's Date: 06/27/2014    History of Present Illness L patella tendon reconstruction; 3rd repair since January per pt    PT Comments    Pt ltd this am by c/o increased pain and muscle spasm.  Follow Up Recommendations  SNF     Equipment Recommendations  Rolling walker with 5" wheels    Recommendations for Other Services       Precautions / Restrictions Precautions Precautions: Fall Required Braces or Orthoses: Knee Immobilizer - Left Knee Immobilizer - Left: On at all times Restrictions Weight Bearing Restrictions: No Other Position/Activity Restrictions: WBAT - WITH KI IN PLACE    Mobility  Bed Mobility Overal bed mobility: Needs Assistance Bed Mobility: Supine to Sit     Supine to sit: Min assist     General bed mobility comments: min cues for LE management and use of R LE to self assist  Transfers Overall transfer level: Needs assistance Equipment used: Rolling walker (2 wheeled) Transfers: Sit to/from Stand Sit to Stand: Min assist         General transfer comment: cues for LE management and use of UEs to self assist  Ambulation/Gait Ambulation/Gait assistance: Min guard Ambulation Distance (Feet): 17 Feet (and 5' to use comode) Assistive device: Rolling walker (2 wheeled) Gait Pattern/deviations: Step-to pattern;Decreased step length - right;Decreased step length - left;Shuffle;Trunk flexed Gait velocity: decr   General Gait Details: cues for sequence, posture and position from RW; lts by dizziness   Stairs            Wheelchair Mobility    Modified Rankin (Stroke Patients Only)       Balance                                    Cognition Arousal/Alertness: Awake/alert Behavior During Therapy: WFL for tasks assessed/performed Overall Cognitive Status: Within Functional Limits for tasks assessed                      Exercises General Exercises - Lower Extremity Ankle Circles/Pumps: AROM;Both;15 reps;Supine    General Comments        Pertinent Vitals/Pain Pain Assessment: 0-10 Pain Score: 7  Pain Location: L knee Pain Descriptors / Indicators: Aching;Spasm Pain Intervention(s): Limited activity within patient's tolerance;Monitored during session;Premedicated before session;Ice applied    Home Living                      Prior Function            PT Goals (current goals can now be found in the care plan section) Acute Rehab PT Goals Patient Stated Goal: This needs to be my last surgery on this leg PT Goal Formulation: With patient Time For Goal Achievement: 07/03/14 Potential to Achieve Goals: Good Progress towards PT goals: Progressing toward goals    Frequency  Min 5X/week    PT Plan Current plan remains appropriate    Co-evaluation             End of Session Equipment Utilized During Treatment: Gait belt;Left knee immobilizer Activity Tolerance: Patient tolerated treatment well Patient left: in chair;with call bell/phone within reach;with family/visitor present     Time: 1010-1033 PT Time Calculation (min) (ACUTE ONLY): 23 min  Charges:  $Gait Training: 8-22 mins $Therapeutic Activity: 8-22 mins  G Codes:      Hasan Douse 07/09/2014, 11:58 AM

## 2014-06-27 NOTE — Progress Notes (Signed)
Date:  June 27, 2014 U.R. performed for needs and level of care. Will continue to follow for Case Management needs.  Velva Harman, RN, BSN, [place]   [phone]

## 2014-06-27 NOTE — Care Management Note (Signed)
Case Management Note  Patient Details  Name: [name] [name] MRN: [id number] Date of Birth: 1934/02/07  Subjective/Objective:                 Ruptured patella   Action/Plan: snf  Expected Discharge Date:       [id number]           Expected Discharge Plan:  Skilled Nursing Facility  In-House Referral:  Clinical Social Work  Discharge planning Services  CM Consult  Post Acute Care Choice:  NA Choice offered to:  NA  DME Arranged:  N/A DME Agency:  NA  HH Arranged:  NA HH Agency:  NA, [facility]  Status of Service:  Completed, signed off  Medicare Important Message Given:  Yes Date Medicare IM Given:  06/27/14 Medicare IM give by:  Srah Ake Date Additional Medicare IM Given:    Additional Medicare Important Message give by:     If discussed at [place] of Stay Meetings, dates discussed:    Additional Comments:  Leeroy Cha, RN 06/27/2014, 10:52 AM

## 2014-06-28 DIAGNOSIS — E559 Vitamin D deficiency, unspecified: Secondary | ICD-10-CM | POA: Diagnosis not present

## 2014-06-28 DIAGNOSIS — M810 Age-related osteoporosis without current pathological fracture: Secondary | ICD-10-CM | POA: Diagnosis not present

## 2014-06-28 DIAGNOSIS — S8992XA Unspecified injury of left lower leg, initial encounter: Secondary | ICD-10-CM | POA: Diagnosis not present

## 2014-06-28 DIAGNOSIS — Z7902 Long term (current) use of antithrombotics/antiplatelets: Secondary | ICD-10-CM | POA: Diagnosis not present

## 2014-06-28 DIAGNOSIS — S86812D Strain of other muscle(s) and tendon(s) at lower leg level, left leg, subsequent encounter: Secondary | ICD-10-CM | POA: Diagnosis not present

## 2014-06-28 DIAGNOSIS — H3530 Unspecified macular degeneration: Secondary | ICD-10-CM | POA: Diagnosis not present

## 2014-06-28 DIAGNOSIS — M25562 Pain in left knee: Secondary | ICD-10-CM | POA: Diagnosis not present

## 2014-06-28 DIAGNOSIS — K449 Diaphragmatic hernia without obstruction or gangrene: Secondary | ICD-10-CM | POA: Diagnosis not present

## 2014-06-28 DIAGNOSIS — M818 Other osteoporosis without current pathological fracture: Secondary | ICD-10-CM | POA: Diagnosis not present

## 2014-06-28 DIAGNOSIS — M4306 Spondylolysis, lumbar region: Secondary | ICD-10-CM | POA: Diagnosis not present

## 2014-06-28 DIAGNOSIS — W1849XA Other slipping, tripping and stumbling without falling, initial encounter: Secondary | ICD-10-CM | POA: Diagnosis not present

## 2014-06-28 DIAGNOSIS — F324 Major depressive disorder, single episode, in partial remission: Secondary | ICD-10-CM | POA: Diagnosis not present

## 2014-06-28 DIAGNOSIS — Z8669 Personal history of other diseases of the nervous system and sense organs: Secondary | ICD-10-CM | POA: Diagnosis not present

## 2014-06-28 DIAGNOSIS — M7989 Other specified soft tissue disorders: Secondary | ICD-10-CM | POA: Diagnosis not present

## 2014-06-28 DIAGNOSIS — Z91040 Latex allergy status: Secondary | ICD-10-CM | POA: Diagnosis not present

## 2014-06-28 DIAGNOSIS — M6281 Muscle weakness (generalized): Secondary | ICD-10-CM | POA: Diagnosis not present

## 2014-06-28 DIAGNOSIS — Y9289 Other specified places as the place of occurrence of the external cause: Secondary | ICD-10-CM | POA: Diagnosis not present

## 2014-06-28 DIAGNOSIS — Z8619 Personal history of other infectious and parasitic diseases: Secondary | ICD-10-CM | POA: Diagnosis not present

## 2014-06-28 DIAGNOSIS — Z7982 Long term (current) use of aspirin: Secondary | ICD-10-CM | POA: Diagnosis not present

## 2014-06-28 DIAGNOSIS — F329 Major depressive disorder, single episode, unspecified: Secondary | ICD-10-CM | POA: Diagnosis not present

## 2014-06-28 DIAGNOSIS — M66269 Spontaneous rupture of extensor tendons, unspecified lower leg: Secondary | ICD-10-CM | POA: Diagnosis not present

## 2014-06-28 DIAGNOSIS — Z96652 Presence of left artificial knee joint: Secondary | ICD-10-CM | POA: Diagnosis not present

## 2014-06-28 DIAGNOSIS — Z9889 Other specified postprocedural states: Secondary | ICD-10-CM | POA: Diagnosis not present

## 2014-06-28 DIAGNOSIS — Y998 Other external cause status: Secondary | ICD-10-CM | POA: Diagnosis not present

## 2014-06-28 DIAGNOSIS — Z4789 Encounter for other orthopedic aftercare: Secondary | ICD-10-CM | POA: Diagnosis not present

## 2014-06-28 DIAGNOSIS — F418 Other specified anxiety disorders: Secondary | ICD-10-CM | POA: Diagnosis not present

## 2014-06-28 DIAGNOSIS — K219 Gastro-esophageal reflux disease without esophagitis: Secondary | ICD-10-CM | POA: Diagnosis not present

## 2014-06-28 DIAGNOSIS — F419 Anxiety disorder, unspecified: Secondary | ICD-10-CM | POA: Diagnosis not present

## 2014-06-28 DIAGNOSIS — Z79899 Other long term (current) drug therapy: Secondary | ICD-10-CM | POA: Diagnosis not present

## 2014-06-28 DIAGNOSIS — R2689 Other abnormalities of gait and mobility: Secondary | ICD-10-CM | POA: Diagnosis not present

## 2014-06-28 DIAGNOSIS — M1990 Unspecified osteoarthritis, unspecified site: Secondary | ICD-10-CM | POA: Diagnosis not present

## 2014-06-28 DIAGNOSIS — Y9389 Activity, other specified: Secondary | ICD-10-CM | POA: Diagnosis not present

## 2014-06-28 DIAGNOSIS — R278 Other lack of coordination: Secondary | ICD-10-CM | POA: Diagnosis not present

## 2014-06-28 DIAGNOSIS — B9681 Helicobacter pylori [H. pylori] as the cause of diseases classified elsewhere: Secondary | ICD-10-CM | POA: Diagnosis not present

## 2014-06-28 DIAGNOSIS — Z87738 Personal history of other specified (corrected) congenital malformations of digestive system: Secondary | ICD-10-CM | POA: Diagnosis not present

## 2014-06-28 DIAGNOSIS — Z471 Aftercare following joint replacement surgery: Secondary | ICD-10-CM | POA: Diagnosis not present

## 2014-06-28 DIAGNOSIS — K5730 Diverticulosis of large intestine without perforation or abscess without bleeding: Secondary | ICD-10-CM | POA: Diagnosis not present

## 2014-06-28 MED ORDER — SERTRALINE HCL 100 MG PO TABS
100.0000 mg | ORAL_TABLET | Freq: Two times a day (BID) | ORAL | Status: DC
  Filled 2014-06-28 (×2): qty 1

## 2014-06-28 NOTE — Progress Notes (Signed)
Discharged from floor via w/c, belongings & spouse with pt. Transferring by car to [facility]. [name] [name], [employer]

## 2014-06-28 NOTE — Clinical Social Work Placement (Signed)
   CLINICAL SOCIAL WORK PLACEMENT  NOTE  Date:  06/28/2014  Patient Details  Name: [name] [name] MRN: [id number] Date of Birth: [birth year]  Clinical Social Work is seeking post-discharge placement for this patient at the [place] level of care (*CSW will initial, date and re-position this form in  chart as items are completed):  Yes   Patient/family provided with [facility] Work Department's list of facilities offering this level of care within the geographic area requested by the patient (or if unable, by the patient's family).  Yes   Patient/family informed of their freedom to choose among providers that offer the needed level of care, that participate in Medicare, Medicaid or managed care program needed by the patient, have an available bed and are willing to accept the patient.  Yes   Patient/family informed of [facility]'s ownership interest in [facility] and [facility], as well as of the fact that they are under no obligation to receive care at these facilities.  PASRR submitted to EDS on       PASRR number received on       Existing PASRR number confirmed on 06/26/14     FL2 transmitted to all facilities in geographic area requested by pt/family on 06/26/14     FL2 transmitted to all facilities within larger geographic area on       Patient informed that his/her managed care company has contracts with or will negotiate with certain facilities, including the following:            Patient/family informed of bed offers received.  Patient chooses bed at  [facility] recommends and patient chooses bed at      Patient to be transferred to   [facility] on  June 28, 2014.  Patient to be transferred to facility by   her husband    Patient family notified on  June 28, 2014 of transfer.  Name of family member notified:   Jessie/husband     PHYSICIAN       Additional Comment:     _______________________________________________ Carlean Jews, LCSW 06/28/2014, 11:20 AM

## 2014-06-28 NOTE — Plan of Care (Signed)
Problem: Discharge Progression Outcomes Goal: Discharge plan in place and appropriate Outcome: Completed/Met Date Met:  06/28/14 Ritta Slot

## 2014-06-28 NOTE — Progress Notes (Signed)
Report given by phone to Sidney Ace at [facility]. [name] [name], [employer]

## 2014-06-28 NOTE — Progress Notes (Signed)
     Subjective: 3 Days Post-Op Procedure(s) (LRB): LEFT PATELLA TENDON REPAIR (Left)   Patient reports pain as mild, pain controlled. No events throughout the night. Discussed the importance of not bending the knee until instructed by Dr. Wynelle Link.  Ready to be discharged to SNF.  Objective:   VITALS:   Filed Vitals:   06/28/14 0624  BP: 117/50  Pulse: 95  Temp: 99 F (37.2 C)  Resp: 16    Dorsiflexion/Plantar flexion intact Incision: dressing C/D/I No cellulitis present Compartment soft  LABS No new labs   Assessment/Plan: 3 Days Post-Op Procedure(s) (LRB): LEFT PATELLA TENDON REPAIR (Left) Dressing changed with 4x4 gauze and tape Up with therapy Discharge to SNF Follow up in 2 weeks at [facility]. Follow up with Dr Wynelle Link in 2 weeks.  Contact information:  [facility] [address] [place]holtz   PAC  06/28/2014, 7:27 AM

## 2014-06-30 DIAGNOSIS — K219 Gastro-esophageal reflux disease without esophagitis: Secondary | ICD-10-CM | POA: Diagnosis not present

## 2014-06-30 DIAGNOSIS — F324 Major depressive disorder, single episode, in partial remission: Secondary | ICD-10-CM | POA: Diagnosis not present

## 2014-06-30 DIAGNOSIS — M810 Age-related osteoporosis without current pathological fracture: Secondary | ICD-10-CM | POA: Diagnosis not present

## 2014-06-30 DIAGNOSIS — Z96652 Presence of left artificial knee joint: Secondary | ICD-10-CM | POA: Diagnosis not present

## 2014-06-30 DIAGNOSIS — M1990 Unspecified osteoarthritis, unspecified site: Secondary | ICD-10-CM | POA: Diagnosis not present

## 2014-07-01 ENCOUNTER — Emergency Department (HOSPITAL_COMMUNITY)
Admission: EM | Admit: 2014-07-01 | Discharge: 2014-07-01 | Disposition: A | Discharge status: Home / Self Care | Payer: Medicare Other | Attending: Emergency Medicine | Admitting: Emergency Medicine

## 2014-07-01 ENCOUNTER — Encounter (HOSPITAL_COMMUNITY): Payer: Self-pay | Admitting: Emergency Medicine

## 2014-07-01 ENCOUNTER — Emergency Department (HOSPITAL_COMMUNITY): Payer: Medicare Other

## 2014-07-01 DIAGNOSIS — E559 Vitamin D deficiency, unspecified: Secondary | ICD-10-CM | POA: Insufficient documentation

## 2014-07-01 DIAGNOSIS — S8992XA Unspecified injury of left lower leg, initial encounter: Secondary | ICD-10-CM | POA: Diagnosis not present

## 2014-07-01 DIAGNOSIS — Y9389 Activity, other specified: Secondary | ICD-10-CM | POA: Insufficient documentation

## 2014-07-01 DIAGNOSIS — Z8669 Personal history of other diseases of the nervous system and sense organs: Secondary | ICD-10-CM | POA: Insufficient documentation

## 2014-07-01 DIAGNOSIS — Z9889 Other specified postprocedural states: Secondary | ICD-10-CM | POA: Insufficient documentation

## 2014-07-01 DIAGNOSIS — F329 Major depressive disorder, single episode, unspecified: Secondary | ICD-10-CM | POA: Insufficient documentation

## 2014-07-01 DIAGNOSIS — Z7902 Long term (current) use of antithrombotics/antiplatelets: Secondary | ICD-10-CM | POA: Insufficient documentation

## 2014-07-01 DIAGNOSIS — F419 Anxiety disorder, unspecified: Secondary | ICD-10-CM | POA: Insufficient documentation

## 2014-07-01 DIAGNOSIS — Z471 Aftercare following joint replacement surgery: Secondary | ICD-10-CM | POA: Diagnosis not present

## 2014-07-01 DIAGNOSIS — Z79899 Other long term (current) drug therapy: Secondary | ICD-10-CM | POA: Insufficient documentation

## 2014-07-01 DIAGNOSIS — M25562 Pain in left knee: Secondary | ICD-10-CM | POA: Diagnosis not present

## 2014-07-01 DIAGNOSIS — Y9289 Other specified places as the place of occurrence of the external cause: Secondary | ICD-10-CM | POA: Insufficient documentation

## 2014-07-01 DIAGNOSIS — Z7982 Long term (current) use of aspirin: Secondary | ICD-10-CM | POA: Insufficient documentation

## 2014-07-01 DIAGNOSIS — M1990 Unspecified osteoarthritis, unspecified site: Secondary | ICD-10-CM | POA: Insufficient documentation

## 2014-07-01 DIAGNOSIS — K219 Gastro-esophageal reflux disease without esophagitis: Secondary | ICD-10-CM | POA: Diagnosis not present

## 2014-07-01 DIAGNOSIS — W1849XA Other slipping, tripping and stumbling without falling, initial encounter: Secondary | ICD-10-CM | POA: Insufficient documentation

## 2014-07-01 DIAGNOSIS — Z87738 Personal history of other specified (corrected) congenital malformations of digestive system: Secondary | ICD-10-CM | POA: Insufficient documentation

## 2014-07-01 DIAGNOSIS — Z8619 Personal history of other infectious and parasitic diseases: Secondary | ICD-10-CM | POA: Insufficient documentation

## 2014-07-01 DIAGNOSIS — Z91040 Latex allergy status: Secondary | ICD-10-CM | POA: Insufficient documentation

## 2014-07-01 DIAGNOSIS — Z96652 Presence of left artificial knee joint: Secondary | ICD-10-CM | POA: Diagnosis not present

## 2014-07-01 DIAGNOSIS — M810 Age-related osteoporosis without current pathological fracture: Secondary | ICD-10-CM | POA: Insufficient documentation

## 2014-07-01 DIAGNOSIS — Y998 Other external cause status: Secondary | ICD-10-CM | POA: Insufficient documentation

## 2014-07-01 DIAGNOSIS — M7989 Other specified soft tissue disorders: Secondary | ICD-10-CM | POA: Diagnosis not present

## 2014-07-01 NOTE — ED Notes (Signed)
Patient transported to X-ray 

## 2014-07-01 NOTE — ED Notes (Signed)
Ice pack placed on pts knee for pain control.

## 2014-07-01 NOTE — ED Provider Notes (Signed)
CSN: [id number]     Arrival date & time 07/01/14  1110 History   First MD Initiated Contact with Patient 07/01/14 1134     Chief Complaint  Patient presents with  . Knee Pain      HPI Patient underwent left knee patellar tendon repair 6 days ago.  Today her foot slipped off of the wheelchair and she acutely bent her left knee and felt sudden pain.  She is concerned that she could've injured her patellar tendon again.  She reports pain in the anterior left knee.  No other symptoms.  Her pain is moderate in severity.  Her pain is worse with movement and palpation of her left knee.  She is in a knee immobilizer.   Past Medical History  Diagnosis Date  . Palpitations   . Esophageal reflux   . Depressive disorder, not elsewhere classified   . Anxiety state, unspecified   . Diaphragmatic hernia without mention of obstruction or gangrene   . External hemorrhoids without mention of complication   . Diverticulosis of colon (without mention of hemorrhage)   . Unspecified vitamin D deficiency   . Helicobacter pylori (H. pylori)   . Macular degeneration (senile) of retina, unspecified   . Shortness of breath 08-11-11    with exertion only at present  . Osteoarthrosis, unspecified whether generalized or localized, unspecified site 08-11-11    hx. "rhematoid arthritis", osteoarthritis, DDD, bursitis(hip)  . Lumbar spondylosis   . Complication of anesthesia     slow to wake up with surgery several years ago   . PONV (postoperative nausea and vomiting)   . Hiatal hernia   . Schatzki's ring   . Unspecified cerebral artery occlusion with cerebral infarction 08-11-11    '97/ '06( TIA)-affected lt. side, slight weakness L side  . Osteoporosis   . Alternating constipation and diarrhea   . Frequency of urination   . History of transfusion   . Difficulty sleeping   . Fatigue    Past Surgical History  Procedure Laterality Date  . Appendectomy    . Tubal ligation    . Knee surgery  08-11-11   rt. knee scope  . Cataract extraction  08-11-11    Bilateral  . Back surgery  08-11-11    '11-hx. lumbar fusion with retained hardware  . Abdominal hysterectomy  08-11-11  . Total knee arthroplasty  08/16/2011    Procedure: TOTAL KNEE ARTHROPLASTY;  Surgeon: Gearlean Alf, MD;  Location: [facility];  Service: Orthopedics;  Laterality: Right;  . Total knee arthroplasty Left 01/27/2014    Procedure: LEFT TOTAL KNEE ARTHROPLASTY;  Surgeon: Gearlean Alf, MD;  Location: [facility];  Service: Orthopedics;  Laterality: Left;  . Patellar tendon repair Left 02/22/2014    Procedure: PATELLA TENDON REPAIR;  Surgeon: Gearlean Alf, MD;  Location: [facility];  Service: Orthopedics;  Laterality: Left;  . Esophageal dilation    . Patellar tendon repair Left 06/25/2014    Procedure: LEFT PATELLA TENDON REPAIR;  Surgeon: Gaynelle Arabian, MD;  Location: [facility];  Service: Orthopedics;  Laterality: Left;   Family History  Problem Relation Age of Onset  . Heart disease Father   . Diabetes Sister   . Coronary artery disease Brother   . Diabetes Brother   . Heart disease Brother   . Pancreatic cancer Daughter   . Colon cancer Neg Hx   . Colon polyps Neg Hx   . Esophageal cancer Neg Hx   . Gallbladder disease Neg  Hx    History  Substance Use Topics  . Smoking status: Never Smoker   . Smokeless tobacco: Never Used  . Alcohol Use: No   OB History    No data available     Review of Systems  All other systems reviewed and are negative.     Allergies  Alendronate sodium; Celecoxib; and Latex  Home Medications   Prior to Admission medications   Medication Sig Start Date End Date Taking? Authorizing Provider  acetaminophen (TYLENOL) 325 MG tablet Take 2 tablets (650 mg total) by mouth every 6 (six) hours as needed for mild pain (or Fever >/= 101). 01/29/14  Yes Arlee Muslim, PA-C  ALPRAZolam Duanne Moron) 0.5 MG tablet Take 1 tablet (0.5 mg total) by mouth at bedtime as needed for sleep. 06/04/14  Yes Abner Greenspan,  MD  atorvastatin (LIPITOR) 10 MG tablet Take 1 tablet (10 mg total) by mouth daily. 06/04/14  Yes Abner Greenspan, MD  bisacodyl (DULCOLAX) 10 MG suppository Place 1 suppository (10 mg total) rectally daily as needed for moderate constipation. 06/27/14  Yes Arlee Muslim, PA-C  calcium carbonate (TUMS - DOSED IN MG ELEMENTAL CALCIUM) 500 MG chewable tablet Chew 1 tablet (200 mg of elemental calcium total) by mouth 4 (four) times daily as needed for indigestion or heartburn. 02/25/14  Yes Arlee Muslim, PA-C  clopidogrel (PLAVIX) 75 MG tablet TAKE 1 TABLET DAILY 03/27/14  Yes Abner Greenspan, MD  esomeprazole (NEXIUM) 40 MG capsule Take 1 capsule (40 mg total) by mouth 2 (two) times daily. 06/04/14  Yes Abner Greenspan, MD  fluticasone (FLONASE) 50 MCG/ACT nasal spray Place 1-2 sprays into both nostrils daily as needed for allergies or rhinitis.   Yes Historical Provider, MD  methocarbamol (ROBAXIN) 500 MG tablet Take 1 tablet (500 mg total) by mouth every 6 (six) hours as needed for muscle spasms. 06/27/14  Yes Arlee Muslim, PA-C  metoCLOPramide (REGLAN) 5 MG tablet Take 1 tablet (5 mg total) by mouth every 8 (eight) hours as needed for nausea (if ondansetron (ZOFRAN) ineffective.). 06/27/14  Yes Arlee Muslim, PA-C  Multiple Vitamins-Minerals (PRESERVISION AREDS PO) Take 1 capsule by mouth 2 (two) times daily.   Yes Historical Provider, MD  ondansetron (ZOFRAN) 4 MG tablet Take 1 tablet (4 mg total) by mouth every 6 (six) hours as needed for nausea. 06/27/14  Yes Arlee Muslim, PA-C  oxyCODONE (OXY IR/ROXICODONE) 5 MG immediate release tablet Take 1-2 tablets (5-10 mg total) by mouth every 3 (three) hours as needed for moderate pain, severe pain or breakthrough pain. 06/27/14  Yes Arlee Muslim, PA-C  Polyethyl Glycol-Propyl Glycol (SYSTANE OP) Apply 1-2 drops to eye at bedtime as needed (dry eyes.).   Yes Historical Provider, MD  polyethylene glycol (MIRALAX / GLYCOLAX) packet Take 17 g by mouth daily as needed for mild  constipation. 06/27/14  Yes Arlee Muslim, PA-C  sertraline (ZOLOFT) 100 MG tablet Take 2 tablets (200 mg total) by mouth daily. Patient taking differently: Take 100 mg by mouth 2 (two) times daily.  06/04/14  Yes Abner Greenspan, MD  aspirin 325 MG tablet Take 1 tablet (325 mg total) by mouth daily. Patient not taking: Reported on 07/01/2014 06/27/14   Arlee Muslim, PA-C  docusate sodium 100 MG CAPS Take 100 mg by mouth 2 (two) times daily. Patient not taking: Reported on 06/19/2014 01/29/14   Arlee Muslim, PA-C  raloxifene (EVISTA) 60 MG tablet Take 1 tablet (60 mg total) by mouth daily. Patient not  taking: Reported on 06/19/2014 06/04/14   Wynelle Fanny Tower, MD   BP 142/68 mmHg  Pulse 80  Temp(Src) 98 F (36.7 C) (Oral)  Resp 18  SpO2 94% Physical Exam  Constitutional: She is oriented to person, place, and time. She appears well-developed and well-nourished.  HENT:  Head: Normocephalic.  Eyes: EOM are normal.  Neck: Normal range of motion.  Pulmonary/Chest: Effort normal.  Abdominal: She exhibits no distension.  Musculoskeletal: Normal range of motion.  Incisions over her anterior left knee are intact without cyanosis signs of infection.  No significant joint effusion.  Normal pulses in left foot.  Given recent patellar tendon repair I did not range the left knee  Neurological: She is alert and oriented to person, place, and time.  Psychiatric: She has a normal mood and affect.  Nursing note and vitals reviewed.   ED Course  Procedures (including critical care time) Labs Review Labs Reviewed - No data to display  Imaging Review Dg Knee Complete 4 Views Left  07/01/2014   CLINICAL DATA:  Left knee pain status post recent patellar tendon repair, bented accidental the knee, 02/21/2013  EXAM: LEFT KNEE - COMPLETE 4+ VIEW  COMPARISON:  02/21/2014  FINDINGS: Four views of the left knee submitted. Again noted left knee prosthesis in anatomic alignment. There is soft tissue swelling anterior  infrapatellar region. There is high riding patella. Recurrent patellar tendon injury cannot be excluded. No acute fracture or subluxation. No evidence of prosthesis loosening.  IMPRESSION: No acute fracture or subluxation. Left knee prosthesis in anatomic alignment. No evidence of prosthesis loosening. High riding patella. Recurrent patellar patellar injury cannot be excluded. Mild soft tissue swelling anterior infrapatellar region.   Electronically Signed   By: Lahoma Crocker M.D.   On: 07/01/2014 12:15  I personally reviewed the imaging tests through PACS system I reviewed available ER/hospitalization records through the EMR    EKG Interpretation None      MDM   Final diagnoses:  None    No fracture. Could represent recurrence of patellar tendon repair. Will refer back to [facility] for follow up this week. Spoke with her Dr Wynelle Link, who reviewed her films and would like to see her back in the office next [place], MD 07/01/14 1321

## 2014-07-01 NOTE — ED Notes (Signed)
Pt comes in today with EMS from [place] with a  C/o left knee pain. Pt is there for rehab post op. Pt's surgery was last Thursday to repair a tendon. Pt was instructed to not bend knee at any time. Pt states that she was going to therapy in the wheelchair when the leg rest gave way and her knee bent.

## 2014-07-08 DIAGNOSIS — Z4789 Encounter for other orthopedic aftercare: Secondary | ICD-10-CM | POA: Diagnosis not present

## 2014-07-08 DIAGNOSIS — S86812D Strain of other muscle(s) and tendon(s) at lower leg level, left leg, subsequent encounter: Secondary | ICD-10-CM | POA: Diagnosis not present

## 2014-07-29 DIAGNOSIS — Z4789 Encounter for other orthopedic aftercare: Secondary | ICD-10-CM | POA: Diagnosis not present

## 2014-07-29 DIAGNOSIS — S86812D Strain of other muscle(s) and tendon(s) at lower leg level, left leg, subsequent encounter: Secondary | ICD-10-CM | POA: Diagnosis not present

## 2014-08-13 DIAGNOSIS — S86812D Strain of other muscle(s) and tendon(s) at lower leg level, left leg, subsequent encounter: Secondary | ICD-10-CM | POA: Diagnosis not present

## 2014-08-13 DIAGNOSIS — Z4789 Encounter for other orthopedic aftercare: Secondary | ICD-10-CM | POA: Diagnosis not present

## 2014-08-21 DIAGNOSIS — S86812D Strain of other muscle(s) and tendon(s) at lower leg level, left leg, subsequent encounter: Secondary | ICD-10-CM | POA: Diagnosis not present

## 2014-09-11 DIAGNOSIS — S86812D Strain of other muscle(s) and tendon(s) at lower leg level, left leg, subsequent encounter: Secondary | ICD-10-CM | POA: Diagnosis not present

## 2014-09-11 DIAGNOSIS — Z471 Aftercare following joint replacement surgery: Secondary | ICD-10-CM | POA: Diagnosis not present

## 2014-09-11 DIAGNOSIS — Z4789 Encounter for other orthopedic aftercare: Secondary | ICD-10-CM | POA: Diagnosis not present

## 2014-09-11 DIAGNOSIS — Z96652 Presence of left artificial knee joint: Secondary | ICD-10-CM | POA: Diagnosis not present

## 2014-09-21 ENCOUNTER — Other Ambulatory Visit: Payer: Self-pay | Admitting: Family Medicine

## 2014-10-08 ENCOUNTER — Other Ambulatory Visit: Payer: Medicare Other

## 2014-10-09 DIAGNOSIS — S86812D Strain of other muscle(s) and tendon(s) at lower leg level, left leg, subsequent encounter: Secondary | ICD-10-CM | POA: Diagnosis not present

## 2014-10-09 DIAGNOSIS — Z4789 Encounter for other orthopedic aftercare: Secondary | ICD-10-CM | POA: Diagnosis not present

## 2014-10-15 ENCOUNTER — Encounter: Payer: Medicare Other | Admitting: Family Medicine

## 2014-10-21 ENCOUNTER — Encounter: Payer: Self-pay | Admitting: Internal Medicine

## 2014-11-24 ENCOUNTER — Telehealth: Payer: Self-pay | Admitting: Family Medicine

## 2014-11-24 NOTE — Telephone Encounter (Signed)
Pt wants to know if she is supposed to have prevnar 13 vaccine every year. cb number is [phone] Thank you

## 2014-11-24 NOTE — Telephone Encounter (Signed)
Pt notified that she only needs one prevnar 13 and she received that last year so she is done, pt verbalized understanding

## 2014-12-01 DIAGNOSIS — M47817 Spondylosis without myelopathy or radiculopathy, lumbosacral region: Secondary | ICD-10-CM | POA: Diagnosis not present

## 2014-12-01 DIAGNOSIS — M810 Age-related osteoporosis without current pathological fracture: Secondary | ICD-10-CM | POA: Diagnosis not present

## 2014-12-03 ENCOUNTER — Other Ambulatory Visit: Payer: Self-pay | Admitting: Neurosurgery

## 2014-12-03 DIAGNOSIS — M47896 Other spondylosis, lumbar region: Secondary | ICD-10-CM

## 2014-12-04 DIAGNOSIS — Z4789 Encounter for other orthopedic aftercare: Secondary | ICD-10-CM | POA: Diagnosis not present

## 2014-12-04 DIAGNOSIS — Z471 Aftercare following joint replacement surgery: Secondary | ICD-10-CM | POA: Diagnosis not present

## 2014-12-04 DIAGNOSIS — Z96652 Presence of left artificial knee joint: Secondary | ICD-10-CM | POA: Diagnosis not present

## 2014-12-19 ENCOUNTER — Ambulatory Visit
Admission: RE | Admit: 2014-12-19 | Discharge: 2014-12-19 | Disposition: A | Discharge status: Home / Self Care | Payer: Medicare Other | Source: Ambulatory Visit | Attending: Neurosurgery | Admitting: Neurosurgery

## 2014-12-19 DIAGNOSIS — M47896 Other spondylosis, lumbar region: Secondary | ICD-10-CM

## 2014-12-19 DIAGNOSIS — M5136 Other intervertebral disc degeneration, lumbar region: Secondary | ICD-10-CM | POA: Diagnosis not present

## 2015-01-20 DIAGNOSIS — M47817 Spondylosis without myelopathy or radiculopathy, lumbosacral region: Secondary | ICD-10-CM | POA: Diagnosis not present

## 2015-01-20 DIAGNOSIS — M810 Age-related osteoporosis without current pathological fracture: Secondary | ICD-10-CM | POA: Diagnosis not present

## 2015-02-17 ENCOUNTER — Other Ambulatory Visit: Payer: Self-pay

## 2015-02-17 DIAGNOSIS — Z1231 Encounter for screening mammogram for malignant neoplasm of breast: Secondary | ICD-10-CM

## 2015-02-18 ENCOUNTER — Telehealth: Payer: Self-pay | Admitting: Family Medicine

## 2015-02-18 ENCOUNTER — Encounter: Payer: Self-pay | Admitting: Family Medicine

## 2015-02-18 ENCOUNTER — Ambulatory Visit (INDEPENDENT_AMBULATORY_CARE_PROVIDER_SITE_OTHER): Payer: Medicare Other | Admitting: Family Medicine

## 2015-02-18 VITALS — BP 124/74 | HR 64 | Temp 97.5°F | Ht 64.5 in | Wt 182.8 lb

## 2015-02-18 DIAGNOSIS — H9201 Otalgia, right ear: Secondary | ICD-10-CM

## 2015-02-18 DIAGNOSIS — R51 Headache: Secondary | ICD-10-CM | POA: Diagnosis not present

## 2015-02-18 DIAGNOSIS — R519 Headache, unspecified: Secondary | ICD-10-CM | POA: Insufficient documentation

## 2015-02-18 MED ORDER — AMOXICILLIN-POT CLAVULANATE 875-125 MG PO TABS: 1.0000 | ORAL_TABLET | Freq: Two times a day (BID) | ORAL | Status: DC

## 2015-02-18 NOTE — Progress Notes (Signed)
Subjective:    Patient ID: [name] [name], female    DOB: 1934/08/04, 81 y.o.   MRN: [id number]  HPI Here with a painful R ear -noticed it a week ago  Worse when she lies on that side  Sore and tender to the touch in the ear canal Does not use q tips   A lot of pressure through the back of her head  Also frontal  Also temple pain on the R side  A lot of dry cough  Nose is not too congested    Also a general headache (wakes up and goes to bed with it)  Stays dizzy and uses her walker and cane - bad balance baseline  No stroke symptoms  No head trauma    She did have migraines in her early 32s   husb was dx with pulm fibrosis -early stages  Stressed out  Xanax helps her sleep   Patient Active Problem List   Diagnosis Date Noted  . Encounter for Medicare annual wellness exam 06/04/2014  . Obesity 06/04/2014  . Estrogen deficiency 06/04/2014  . Colon cancer screening 06/04/2014  . Dysphagia, pharyngoesophageal phase 03/27/2014  . Schatzki's ring 03/27/2014  . Patellar fracture 02/21/2014  . Patellar tendon rupture 02/21/2014  . Acute blood loss anemia 02/17/2014  . Hypokalemia 02/17/2014  . Pre-operative examination 12/27/2013  . Dysphagia 10/17/2011  . OA (osteoarthritis) of knee 08/16/2011  . Hemorrhoids 08/10/2011  . Other dyspnea and respiratory abnormality 06/21/2010  . Urinary incontinence 05/27/2010  . OVERACTIVE BLADDER 03/12/2010  . DEGENERATIVE DISC DISEASE 04/13/2009  . GERD 10/15/2008  . CEREBROVASCULAR ACCIDENT, HX OF 10/15/2008  . ARTHRITIS, GENERALIZED 06/26/2008  . EXTERNAL HEMORRHOIDS 02/01/2008  . HIATAL HERNIA 02/01/2008  . DIVERTICULOSIS OF COLON 02/01/2008  . GASTRITIS, HX OF 02/01/2008  . Vitamin D deficiency 07/03/2007  . Osteopenia 06/13/2007  . Fatigue 06/13/2007  . HELICOBACTER PYLORI INFECTION 06/06/2006  . Hyperlipidemia 06/06/2006  . Generalized anxiety disorder 06/06/2006  . Adjustment disorder with mixed anxiety and  depressed mood 06/06/2006  . MACULAR DEGENERATION 06/06/2006  . CVA 06/06/2006  . OSTEOARTHRITIS 06/06/2006  . PALPITATIONS 05/25/2006   Past Medical History  Diagnosis Date  . Palpitations   . Esophageal reflux   . Depressive disorder, not elsewhere classified   . Anxiety state, unspecified   . Diaphragmatic hernia without mention of obstruction or gangrene   . External hemorrhoids without mention of complication   . Diverticulosis of colon (without mention of hemorrhage)   . Unspecified vitamin D deficiency   . Helicobacter pylori (H. pylori)   . Macular degeneration (senile) of retina, unspecified   . Shortness of breath 08-11-11    with exertion only at present  . Osteoarthrosis, unspecified whether generalized or localized, unspecified site 08-11-11    hx. "rhematoid arthritis", osteoarthritis, DDD, bursitis(hip)  . Lumbar spondylosis   . Complication of anesthesia     slow to wake up with surgery several years ago   . PONV (postoperative nausea and vomiting)   . Hiatal hernia   . Schatzki's ring   . Unspecified cerebral artery occlusion with cerebral infarction 08-11-11    '97/ '06( TIA)-affected lt. side, slight weakness L side  . Osteoporosis   . Alternating constipation and diarrhea   . Frequency of urination   . History of transfusion   . Difficulty sleeping   . Fatigue    Past Surgical History  Procedure Laterality Date  . Appendectomy    . Tubal ligation    .  Knee surgery  08-11-11    rt. knee scope  . Cataract extraction  08-11-11    Bilateral  . Back surgery  08-11-11    '11-hx. lumbar fusion with retained hardware  . Abdominal hysterectomy  08-11-11  . Total knee arthroplasty  08/16/2011    Procedure: TOTAL KNEE ARTHROPLASTY;  Surgeon: Gearlean Alf, MD;  Location: [facility];  Service: Orthopedics;  Laterality: Right;  . Total knee arthroplasty Left 01/27/2014    Procedure: LEFT TOTAL KNEE ARTHROPLASTY;  Surgeon: Gearlean Alf, MD;  Location: [facility];   Service: Orthopedics;  Laterality: Left;  . Patellar tendon repair Left 02/22/2014    Procedure: PATELLA TENDON REPAIR;  Surgeon: Gearlean Alf, MD;  Location: [facility];  Service: Orthopedics;  Laterality: Left;  . Esophageal dilation    . Patellar tendon repair Left 06/25/2014    Procedure: LEFT PATELLA TENDON REPAIR;  Surgeon: Gaynelle Arabian, MD;  Location: [facility];  Service: Orthopedics;  Laterality: Left;   Social History  Substance Use Topics  . Smoking status: Never Smoker   . Smokeless tobacco: Never Used  . Alcohol Use: No   Family History  Problem Relation Age of Onset  . Heart disease Father   . Diabetes Sister   . Coronary artery disease Brother   . Diabetes Brother   . Heart disease Brother   . Pancreatic cancer Daughter   . Colon cancer Neg Hx   . Colon polyps Neg Hx   . Esophageal cancer Neg Hx   . Gallbladder disease Neg Hx    Allergies  Allergen Reactions  . Alendronate Sodium     REACTION: JAW PAIN  . Celecoxib     REACTION: GI UPSET  . Latex Itching and Rash   Current Outpatient Prescriptions on File Prior to Visit  Medication Sig Dispense Refill  . acetaminophen (TYLENOL) 325 MG tablet Take 2 tablets (650 mg total) by mouth every 6 (six) hours as needed for mild pain (or Fever >/= 101). 40 tablet 0  . ALPRAZolam (XANAX) 0.5 MG tablet Take 1 tablet (0.5 mg total) by mouth at bedtime as needed for sleep. 90 tablet 0  . atorvastatin (LIPITOR) 10 MG tablet Take 1 tablet (10 mg total) by mouth daily. 90 tablet 3  . calcium carbonate (TUMS - DOSED IN MG ELEMENTAL CALCIUM) 500 MG chewable tablet Chew 1 tablet (200 mg of elemental calcium total) by mouth 4 (four) times daily as needed for indigestion or heartburn. 10 tablet 0  . clopidogrel (PLAVIX) 75 MG tablet TAKE 1 TABLET DAILY 90 tablet 1  . esomeprazole (NEXIUM) 40 MG capsule Take 1 capsule (40 mg total) by mouth 2 (two) times daily. 180 capsule 3  . fluticasone (FLONASE) 50 MCG/ACT nasal spray Place 1-2 sprays  into both nostrils daily as needed for allergies or rhinitis.    . methocarbamol (ROBAXIN) 500 MG tablet Take 1 tablet (500 mg total) by mouth every 6 (six) hours as needed for muscle spasms. 80 tablet 0  . Multiple Vitamins-Minerals (PRESERVISION AREDS PO) Take 1 capsule by mouth 2 (two) times daily.    . ondansetron (ZOFRAN) 4 MG tablet Take 1 tablet (4 mg total) by mouth every 6 (six) hours as needed for nausea. 40 tablet 0  . oxyCODONE (OXY IR/ROXICODONE) 5 MG immediate release tablet Take 1-2 tablets (5-10 mg total) by mouth every 3 (three) hours as needed for moderate pain, severe pain or breakthrough pain. 80 tablet 0  . Polyethyl Glycol-Propyl Glycol (  SYSTANE OP) Apply 1-2 drops to eye at bedtime as needed (dry eyes.).    Marland Kitchen polyethylene glycol (MIRALAX / GLYCOLAX) packet Take 17 g by mouth daily as needed for mild constipation. 14 each 0  . sertraline (ZOLOFT) 100 MG tablet Take 2 tablets (200 mg total) by mouth daily. (Patient taking differently: Take 100 mg by mouth 2 (two) times daily. ) 180 tablet 3  . aspirin 325 MG tablet Take 1 tablet (325 mg total) by mouth daily. (Patient not taking: Reported on 07/01/2014) 30 tablet 0  . bisacodyl (DULCOLAX) 10 MG suppository Place 1 suppository (10 mg total) rectally daily as needed for moderate constipation. (Patient not taking: Reported on 02/18/2015) 12 suppository 0  . docusate sodium 100 MG CAPS Take 100 mg by mouth 2 (two) times daily. (Patient not taking: Reported on 06/19/2014) 10 capsule 0  . metoCLOPramide (REGLAN) 5 MG tablet Take 1 tablet (5 mg total) by mouth every 8 (eight) hours as needed for nausea (if ondansetron (ZOFRAN) ineffective.). (Patient not taking: Reported on 02/18/2015) 40 tablet 0  . raloxifene (EVISTA) 60 MG tablet Take 1 tablet (60 mg total) by mouth daily. (Patient not taking: Reported on 06/19/2014) 90 tablet 3   No current facility-administered medications on file prior to visit.    Review of Systems    Review of Systems    Constitutional: Negative for fever, appetite change, and unexpected weight change.  ENT pos for R sided ear and sinus pain/ neg for ear drainage, neg for ST Eyes: Negative for pain and visual disturbance.  Respiratory: Negative for wheeze  and shortness of breath.  pos for mild dry cough Cardiovascular: Negative for cp or palpitations    Gastrointestinal: Negative for nausea, diarrhea and constipation.  Genitourinary: Negative for urgency and frequency.  Skin: Negative for pallor or rash   Neurological: Negative for weakness, light-, numbness and pos for headaches.  Hematological: Negative for adenopathy. Does not bruise/bleed easily.  Psychiatric/Behavioral: Negative for dysphoric mood. The patient is not nervous/anxious.      Objective:   Physical Exam  Constitutional: She is oriented to person, place, and time. She appears well-developed and well-nourished. No distress.  Well appearing   HENT:  Head: Normocephalic and atraumatic.  Right Ear: External ear normal.  Left Ear: External ear normal.  Nose: Nose normal.  Mouth/Throat: Oropharynx is clear and moist. No oropharyngeal exudate.  R ethmoid and maxillary sinus tenderness  No temporal tenderness   TMJ tenderness noted on the R w/o popping or dislocation  No rash or facial swelling Nares are mildly edematous TMs clear  R ear canal nl app with scant cerumen  Eyes: Conjunctivae and EOM are normal. Pupils are equal, round, and reactive to light. Right eye exhibits no discharge. Left eye exhibits no discharge. No scleral icterus.  No nystagmus  Neck: Normal range of motion and full passive range of motion without pain. Neck supple. No JVD present. Carotid bruit is not present. No tracheal deviation present. No thyromegaly present.  Cardiovascular: Normal rate, regular rhythm and normal heart sounds.   No murmur heard. Pulmonary/Chest: Effort normal and breath sounds normal. No respiratory distress. She has no wheezes. She has no  rales.  Abdominal: Soft. Bowel sounds are normal. She exhibits no distension and no mass. There is no tenderness.  Musculoskeletal: She exhibits no edema or tenderness.  Lymphadenopathy:    She has no cervical adenopathy.  Neurological: She is alert and oriented to person, place, and time. She has  normal strength and normal reflexes. She displays no atrophy and no tremor. No cranial nerve deficit or sensory deficit. She exhibits normal muscle tone. She displays a negative Romberg sign. Coordination and gait normal.  No focal cerebellar signs   Skin: Skin is warm and dry. No rash noted. No pallor.  Psychiatric: She has a normal mood and affect. Her behavior is normal. Thought content normal.          Assessment & Plan:   Problem List Items Addressed This Visit      Other   Right ear pain    Nl ear exam with scant cerumen  ? If ref pain from sinusitis or TA or TMJ See assessment for HA  Will update if no improvement after tx of sinusitis  Check ESR       Right-sided headache - Primary    With sinus and TMJ tenderness Also ear pain  Disc differential incl sinusitis , temporal arteritis , and TMJ Will tx with augmentin for sinusitis (Disc symptomatic care - see instructions on AVS) Check ESR also  Use warm compresses on face  Update if not starting to improve in a week or if worsening        Relevant Orders   Sedimentation Rate   CBC with Differential/Platelet

## 2015-02-18 NOTE — Progress Notes (Signed)
Pre visit review using our clinic review tool, if applicable. No additional management support is needed unless otherwise documented below in the visit note. 

## 2015-02-18 NOTE — Patient Instructions (Signed)
Take augmentin for a suspected sinus infection  Use nasal saline spray if you congested and breathe steam  Warm compress on painful area of face and ear will help  Labs today for blood count and sed rate (testing for temporal arteritis)   Update if not starting to improve in a week to 10 days  or if worsening

## 2015-02-19 LAB — CBC WITH DIFFERENTIAL/PLATELET
BASOS ABS: 0.1 10*3/uL (ref 0.0–0.1)
Basophils Relative: 0.8 % (ref 0.0–3.0)
EOS PCT: 3 % (ref 0.0–5.0)
Eosinophils Absolute: 0.2 10*3/uL (ref 0.0–0.7)
HCT: 36.9 % (ref 36.0–46.0)
Hemoglobin: 12.1 g/dL (ref 12.0–15.0)
LYMPHS ABS: 2.3 10*3/uL (ref 0.7–4.0)
Lymphocytes Relative: 34.6 % (ref 12.0–46.0)
MCHC: 32.7 g/dL (ref 30.0–36.0)
MCV: 77.9 fl — AB (ref 78.0–100.0)
MONOS PCT: 7.6 % (ref 3.0–12.0)
Monocytes Absolute: 0.5 10*3/uL (ref 0.1–1.0)
NEUTROS PCT: 54 % (ref 43.0–77.0)
Neutro Abs: 3.6 10*3/uL (ref 1.4–7.7)
Platelets: 287 10*3/uL (ref 150.0–400.0)
RBC: 4.74 Mil/uL (ref 3.87–5.11)
RDW: 16.7 % — ABNORMAL HIGH (ref 11.5–15.5)
WBC: 6.7 10*3/uL (ref 4.0–10.5)

## 2015-02-19 LAB — SEDIMENTATION RATE: SED RATE: 9 mm/h (ref 0–22)

## 2015-02-19 NOTE — Assessment & Plan Note (Signed)
With sinus and TMJ tenderness Also ear pain  Disc differential incl sinusitis , temporal arteritis , and TMJ Will tx with augmentin for sinusitis (Disc symptomatic care - see instructions on AVS) Check ESR also  Use warm compresses on face  Update if not starting to improve in a week or if worsening

## 2015-02-19 NOTE — Assessment & Plan Note (Signed)
Nl ear exam with scant cerumen  ? If ref pain from sinusitis or TA or TMJ See assessment for HA  Will update if no improvement after tx of sinusitis  Check ESR

## 2015-03-06 DIAGNOSIS — S86812D Strain of other muscle(s) and tendon(s) at lower leg level, left leg, subsequent encounter: Secondary | ICD-10-CM | POA: Diagnosis not present

## 2015-03-16 ENCOUNTER — Ambulatory Visit
Admission: RE | Admit: 2015-03-16 | Discharge: 2015-03-16 | Disposition: A | Discharge status: Home / Self Care | Payer: Medicare Other | Source: Ambulatory Visit

## 2015-03-16 DIAGNOSIS — Z1231 Encounter for screening mammogram for malignant neoplasm of breast: Secondary | ICD-10-CM

## 2015-03-16 LAB — HM MAMMOGRAPHY: HM Mammogram: NORMAL

## 2015-03-17 ENCOUNTER — Encounter: Payer: Self-pay | Admitting: *Deleted

## 2015-03-17 DIAGNOSIS — H524 Presbyopia: Secondary | ICD-10-CM | POA: Diagnosis not present

## 2015-03-17 DIAGNOSIS — D4981 Neoplasm of unspecified behavior of retina and choroid: Secondary | ICD-10-CM | POA: Diagnosis not present

## 2015-03-17 DIAGNOSIS — H353131 Nonexudative age-related macular degeneration, bilateral, early dry stage: Secondary | ICD-10-CM | POA: Diagnosis not present

## 2015-03-17 DIAGNOSIS — H43813 Vitreous degeneration, bilateral: Secondary | ICD-10-CM | POA: Diagnosis not present

## 2015-03-17 DIAGNOSIS — H04123 Dry eye syndrome of bilateral lacrimal glands: Secondary | ICD-10-CM | POA: Diagnosis not present

## 2015-03-20 ENCOUNTER — Other Ambulatory Visit: Payer: Self-pay | Admitting: Family Medicine

## 2015-03-20 NOTE — Telephone Encounter (Signed)
Pt last BMP 06/2014-abnormal. pls advise

## 2015-03-20 NOTE — Telephone Encounter (Signed)
Aware-please refill for a year

## 2015-03-20 NOTE — Telephone Encounter (Signed)
done

## 2015-04-21 ENCOUNTER — Other Ambulatory Visit: Payer: Self-pay

## 2015-04-21 MED ORDER — ALPRAZOLAM 0.5 MG PO TABS: 0.5000 mg | ORAL_TABLET | Freq: Every evening | ORAL | Status: DC | PRN

## 2015-04-21 NOTE — Telephone Encounter (Signed)
Pt left v/m requesting small quantity refill of alprazolam at [facility] [address] until pt can get rx [facility]. Pt request cb when refilled.alprazolam last refilled # 90 on 06/04/14. Pt last seen annual 06/04/14 and last acute visit was 02/18/15.Please advise.

## 2015-04-21 NOTE — Telephone Encounter (Signed)
Px written for call in   Does she also need a 90 day sent to [facility]?

## 2015-04-22 MED ORDER — ALPRAZOLAM 0.5 MG PO TABS: 0.5000 mg | ORAL_TABLET | Freq: Every evening | ORAL | Status: DC | PRN

## 2015-04-22 NOTE — Telephone Encounter (Signed)
Rx faxed to [facility].

## 2015-04-22 NOTE — Telephone Encounter (Signed)
Printed px to fax

## 2015-04-22 NOTE — Telephone Encounter (Signed)
Rx called in as prescribed. Pt said she requested a refill from [facility] and they advise her that they would have to send [place] a refill request so she does need a 90 day Rx (has to be paper Rx) faxed to [facility]

## 2015-04-28 DIAGNOSIS — M810 Age-related osteoporosis without current pathological fracture: Secondary | ICD-10-CM | POA: Diagnosis not present

## 2015-04-28 DIAGNOSIS — M47817 Spondylosis without myelopathy or radiculopathy, lumbosacral region: Secondary | ICD-10-CM | POA: Diagnosis not present

## 2015-05-29 ENCOUNTER — Other Ambulatory Visit: Payer: Self-pay | Admitting: Family Medicine

## 2015-06-04 ENCOUNTER — Encounter: Payer: Self-pay | Admitting: Family Medicine

## 2015-06-04 ENCOUNTER — Ambulatory Visit (INDEPENDENT_AMBULATORY_CARE_PROVIDER_SITE_OTHER): Payer: Medicare Other | Admitting: Family Medicine

## 2015-06-04 VITALS — BP 114/60 | HR 81 | Temp 97.8°F | Wt 183.2 lb

## 2015-06-04 DIAGNOSIS — J069 Acute upper respiratory infection, unspecified: Secondary | ICD-10-CM | POA: Diagnosis not present

## 2015-06-04 MED ORDER — AZITHROMYCIN 250 MG PO TABS: ORAL_TABLET | ORAL | Status: DC

## 2015-06-04 NOTE — Progress Notes (Signed)
Pre visit review using our clinic review tool, if applicable. No additional management support is needed unless otherwise documented below in the visit note. 

## 2015-06-04 NOTE — Patient Instructions (Signed)
Take antibiotic as directed if your symptoms progress.  Drink lots of fluids.    Treat sympotmatically with Mucinex, nasal saline irrigation, and Tylenol/Ibuprofen.   Try over the counter nasocort-start with 2 sprays per nostril per day...and then try to taper to 1 spray per nostril once symptoms improve.   You can use warm compresses.     Call if not improving as expected in 5-7 days.

## 2015-06-04 NOTE — Progress Notes (Signed)
SUBJECTIVE:  [name] [name] is a 81 y.o. female patient of Dr. Glori Bickers, new to me, who complains of coryza, sneezing, sore throat, dry cough and chills for 14 days. She denies a history of anorexia and chest pain and denies a history of asthma. Patient denies smoke cigarettes.   Current Outpatient Prescriptions on File Prior to Visit  Medication Sig Dispense Refill  . acetaminophen (TYLENOL) 325 MG tablet Take 2 tablets (650 mg total) by mouth every 6 (six) hours as needed for mild pain (or Fever >/= 101). 40 tablet 0  . ALPRAZolam (XANAX) 0.5 MG tablet Take 1 tablet (0.5 mg total) by mouth at bedtime as needed for sleep. 90 tablet 0  . atorvastatin (LIPITOR) 10 MG tablet TAKE 1 TABLET DAILY 90 tablet 0  . calcium carbonate (TUMS - DOSED IN MG ELEMENTAL CALCIUM) 500 MG chewable tablet Chew 1 tablet (200 mg of elemental calcium total) by mouth 4 (four) times daily as needed for indigestion or heartburn. 10 tablet 0  . clopidogrel (PLAVIX) 75 MG tablet TAKE 1 TABLET DAILY 90 tablet 3  . fluticasone (FLONASE) 50 MCG/ACT nasal spray Place 1-2 sprays into both nostrils daily as needed for allergies or rhinitis.    . methocarbamol (ROBAXIN) 500 MG tablet Take 1 tablet (500 mg total) by mouth every 6 (six) hours as needed for muscle spasms. 80 tablet 0  . Multiple Vitamins-Minerals (PRESERVISION AREDS PO) Take 1 capsule by mouth 2 (two) times daily.    Marland Kitchen NEXIUM 40 MG capsule TAKE 1 CAPSULE TWICE A DAY 180 capsule 0  . oxyCODONE (OXY IR/ROXICODONE) 5 MG immediate release tablet Take 1-2 tablets (5-10 mg total) by mouth every 3 (three) hours as needed for moderate pain, severe pain or breakthrough pain. 80 tablet 0  . Polyethyl Glycol-Propyl Glycol (SYSTANE OP) Apply 1-2 drops to eye at bedtime as needed (dry eyes.).    Marland Kitchen polyethylene glycol (MIRALAX / GLYCOLAX) packet Take 17 g by mouth daily as needed for mild constipation. 14 each 0  . raloxifene (EVISTA) 60 MG tablet TAKE 1 TABLET DAILY 90 tablet 0  .  sertraline (ZOLOFT) 100 MG tablet Take 2 tablets (200 mg total) by mouth daily. (Patient taking differently: Take 100 mg by mouth 2 (two) times daily. ) 180 tablet 3   No current facility-administered medications on file prior to visit.    Allergies  Allergen Reactions  . Alendronate Sodium     REACTION: JAW PAIN  . Celecoxib     REACTION: GI UPSET  . Latex Itching and Rash    Past Medical History  Diagnosis Date  . Palpitations   . Esophageal reflux   . Depressive disorder, not elsewhere classified   . Anxiety state, unspecified   . Diaphragmatic hernia without mention of obstruction or gangrene   . External hemorrhoids without mention of complication   . Diverticulosis of colon (without mention of hemorrhage)   . Unspecified vitamin D deficiency   . Helicobacter pylori (H. pylori)   . Macular degeneration (senile) of retina, unspecified   . Shortness of breath 08-11-11    with exertion only at present  . Osteoarthrosis, unspecified whether generalized or localized, unspecified site 08-11-11    hx. "rhematoid arthritis", osteoarthritis, DDD, bursitis(hip)  . Lumbar spondylosis   . Complication of anesthesia     slow to wake up with surgery several years ago   . PONV (postoperative nausea and vomiting)   . Hiatal hernia   . Schatzki's ring   .  Unspecified cerebral artery occlusion with cerebral infarction 08-11-11    '97/ '06( TIA)-affected lt. side, slight weakness L side  . Osteoporosis   . Alternating constipation and diarrhea   . Frequency of urination   . History of transfusion   . Difficulty sleeping   . Fatigue     Past Surgical History  Procedure Laterality Date  . Appendectomy    . Tubal ligation    . Knee surgery  08-11-11    rt. knee scope  . Cataract extraction  08-11-11    Bilateral  . Back surgery  08-11-11    '11-hx. lumbar fusion with retained hardware  . Abdominal hysterectomy  08-11-11  . Total knee arthroplasty  08/16/2011    Procedure: TOTAL KNEE  ARTHROPLASTY;  Surgeon: Gearlean Alf, MD;  Location: [facility];  Service: Orthopedics;  Laterality: Right;  . Total knee arthroplasty Left 01/27/2014    Procedure: LEFT TOTAL KNEE ARTHROPLASTY;  Surgeon: Gearlean Alf, MD;  Location: [facility];  Service: Orthopedics;  Laterality: Left;  . Patellar tendon repair Left 02/22/2014    Procedure: PATELLA TENDON REPAIR;  Surgeon: Gearlean Alf, MD;  Location: [facility];  Service: Orthopedics;  Laterality: Left;  . Esophageal dilation    . Patellar tendon repair Left 06/25/2014    Procedure: LEFT PATELLA TENDON REPAIR;  Surgeon: Gaynelle Arabian, MD;  Location: [facility];  Service: Orthopedics;  Laterality: Left;    Family History  Problem Relation Age of Onset  . Heart disease Father   . Diabetes Sister   . Coronary artery disease Brother   . Diabetes Brother   . Heart disease Brother   . Pancreatic cancer Daughter   . Colon cancer Neg Hx   . Colon polyps Neg Hx   . Esophageal cancer Neg Hx   . Gallbladder disease Neg Hx     Social History   Social History  . Marital Status: Legally Separated    Spouse Name: N/A  . Number of Children: 3  . Years of Education: N/A   Occupational History  . disabled    Social History Main Topics  . Smoking status: Never Smoker   . Smokeless tobacco: Never Used  . Alcohol Use: No  . Drug Use: No  . Sexual Activity: Not on file   Other Topics Concern  . Not on file   Social History Narrative   The PMH, PSH, Social History, Family History, Medications, and allergies have been reviewed in [facility], and have been updated if relevant.  OBJECTIVE: BP 114/60 mmHg  Pulse 81  Temp(Src) 97.8 F (36.6 C) (Oral)  Wt 183 lb 4 oz (83.122 kg)  SpO2 98%  She appears well, vital signs are as noted. Ears normal.  Throat and pharynx normal.  Neck supple. No adenopathy in the neck. Nose is congested. Sinuses non tender. The chest is clear, without wheezes or rales.  ASSESSMENT:  viral upper respiratory  illness  PLAN: Symptomatic therapy suggested: push fluids, rest and return office visit prn if symptoms persist or worsen. Lack of antibiotic effectiveness discussed with her. Rx given to pt for zpack to fill if symptoms progress or do not improve as anticipated. Call or return to clinic prn if these symptoms worsen or fail to improve as anticipated.

## 2015-06-08 ENCOUNTER — Ambulatory Visit (INDEPENDENT_AMBULATORY_CARE_PROVIDER_SITE_OTHER): Payer: Medicare Other | Admitting: Cardiovascular Disease

## 2015-06-08 ENCOUNTER — Encounter: Payer: Self-pay | Admitting: Cardiovascular Disease

## 2015-06-08 VITALS — BP 120/90 | HR 80 | Ht 65.0 in | Wt 185.4 lb

## 2015-06-08 DIAGNOSIS — R002 Palpitations: Secondary | ICD-10-CM | POA: Diagnosis not present

## 2015-06-08 DIAGNOSIS — R079 Chest pain, unspecified: Secondary | ICD-10-CM | POA: Diagnosis not present

## 2015-06-08 DIAGNOSIS — R0602 Shortness of breath: Secondary | ICD-10-CM | POA: Diagnosis not present

## 2015-06-08 NOTE — Patient Instructions (Signed)
Medication Instructions:  Your physician recommends that you continue on your current medications as directed. Please refer to the Current Medication list given to you today.  Labwork: No new orders.   Testing/Procedures: Your physician has requested that you have an echocardiogram. Echocardiography is a painless test that uses sound waves to create images of your heart. It provides your doctor with information about the size and shape of your heart and how well your heart's chambers and valves are working. This procedure takes approximately one hour. There are no restrictions for this procedure.  Your physician has requested that you have a lexiscan myoview. For further information please visit [email or web address]. Please follow instruction sheet, as given.  Your physician has recommended that you wear a 48 hour holter monitor. Holter monitors are medical devices that record the heart's electrical activity. Doctors most often use these monitors to diagnose arrhythmias. Arrhythmias are problems with the speed or rhythm of the heartbeat. The monitor is a small, portable device. You can wear one while you do your normal daily activities. This is usually used to diagnose what is causing palpitations/syncope (passing out).  Follow-Up: Your physician wants you to follow-up in: 1 YEAR with Dr Burt Knack.  You will receive a reminder letter in the mail two months in advance. If you don't receive a letter, please call our office to schedule the follow-up appointment.   Any Other Special Instructions Will Be Listed Below (If Applicable).     If you need a refill on your cardiac medications before your next appointment, please call your pharmacy.

## 2015-06-08 NOTE — Progress Notes (Signed)
Cardiology Office Note Date:  06/08/2015   ID:  [name] [name], DOB 1934/10/20, MRN [id number]  PCP:  Loura Pardon, MD  Cardiologist:  Sherren Mocha, MD    Chief Complaint  Patient presents with  . Chest Pain    lee, claudication  . Shortness of Breath  . Palpitations   History of Present Illness: [name] [name] is a 81 y.o. female who presents for Follow-up evaluation. She was last seen in July 2015. Her history includes chest pain, heart palpitations, and TIA. She's been maintained on long-term clopidogrel. Her last echocardiogram showed normal LV systolic function with mild LVH.  She's had a tough year with multiple knee surgeries.  She complains of progressive exertional dyspnea over the last several months. Also complains of increased palpitations, worse when sitting up at rest. Complains of lightheadedness with standing, but no syncope. Has a dull chest pain located in the central/left chest brought on by stress. Also some discomfort after a walk. No radiation of pain. She's concerned about her heart because multiple family members have had cardiac events over the past few years. Denies orthopnea, PND, or syncope.   Past Medical History  Diagnosis Date  . Palpitations   . Esophageal reflux   . Depressive disorder, not elsewhere classified   . Anxiety state, unspecified   . Diaphragmatic hernia without mention of obstruction or gangrene   . External hemorrhoids without mention of complication   . Diverticulosis of colon (without mention of hemorrhage)   . Unspecified vitamin D deficiency   . Helicobacter pylori (H. pylori)   . Macular degeneration (senile) of retina, unspecified   . Shortness of breath 08-11-11    with exertion only at present  . Osteoarthrosis, unspecified whether generalized or localized, unspecified site 08-11-11    hx. "rhematoid arthritis", osteoarthritis, DDD, bursitis(hip)  . Lumbar spondylosis   . Complication of anesthesia     slow to wake  up with surgery several years ago   . PONV (postoperative nausea and vomiting)   . Hiatal hernia   . Schatzki's ring   . Unspecified cerebral artery occlusion with cerebral infarction 08-11-11    '97/ '06( TIA)-affected lt. side, slight weakness L side  . Osteoporosis   . Alternating constipation and diarrhea   . Frequency of urination   . History of transfusion   . Difficulty sleeping   . Fatigue     Past Surgical History  Procedure Laterality Date  . Appendectomy    . Tubal ligation    . Knee surgery  08-11-11    rt. knee scope  . Cataract extraction  08-11-11    Bilateral  . Back surgery  08-11-11    '11-hx. lumbar fusion with retained hardware  . Abdominal hysterectomy  08-11-11  . Total knee arthroplasty  08/16/2011    Procedure: TOTAL KNEE ARTHROPLASTY;  Surgeon: Gearlean Alf, MD;  Location: [facility];  Service: Orthopedics;  Laterality: Right;  . Total knee arthroplasty Left 01/27/2014    Procedure: LEFT TOTAL KNEE ARTHROPLASTY;  Surgeon: Gearlean Alf, MD;  Location: [facility];  Service: Orthopedics;  Laterality: Left;  . Patellar tendon repair Left 02/22/2014    Procedure: PATELLA TENDON REPAIR;  Surgeon: Gearlean Alf, MD;  Location: [facility];  Service: Orthopedics;  Laterality: Left;  . Esophageal dilation    . Patellar tendon repair Left 06/25/2014    Procedure: LEFT PATELLA TENDON REPAIR;  Surgeon: Gaynelle Arabian, MD;  Location: [facility];  Service: Orthopedics;  Laterality: Left;    Current Outpatient Prescriptions  Medication Sig Dispense Refill  . acetaminophen (TYLENOL) 325 MG tablet Take 2 tablets (650 mg total) by mouth every 6 (six) hours as needed for mild pain (or Fever >/= 101). 40 tablet 0  . ALPRAZolam (XANAX) 0.5 MG tablet Take 1 tablet (0.5 mg total) by mouth at bedtime as needed for sleep. 90 tablet 0  . atorvastatin (LIPITOR) 10 MG tablet Take 10 mg by mouth daily.    Marland Kitchen azithromycin (ZITHROMAX) 250 MG tablet 2 tabs by mouth on day 1 followed by 1 tab by mouth  daily days 2- 5 6 tablet 0  . calcium carbonate (TUMS - DOSED IN MG ELEMENTAL CALCIUM) 500 MG chewable tablet Chew 1 tablet (200 mg of elemental calcium total) by mouth 4 (four) times daily as needed for indigestion or heartburn. 10 tablet 0  . clopidogrel (PLAVIX) 75 MG tablet Take 75 mg by mouth daily.    Marland Kitchen esomeprazole (NEXIUM) 40 MG capsule Take 40 mg by mouth 2 (two) times daily before a meal.    . fluticasone (FLONASE) 50 MCG/ACT nasal spray Place 1-2 sprays into both nostrils daily as needed for allergies or rhinitis.    Marland Kitchen HYDROcodone-acetaminophen (NORCO) 10-325 MG tablet Take 1 tablet by mouth every 6 (six) hours as needed. pain  0  . methocarbamol (ROBAXIN) 500 MG tablet Take 1 tablet (500 mg total) by mouth every 6 (six) hours as needed for muscle spasms. 80 tablet 0  . Multiple Vitamins-Minerals (PRESERVISION AREDS PO) Take 1 capsule by mouth 2 (two) times daily.    Marland Kitchen oxyCODONE (OXY IR/ROXICODONE) 5 MG immediate release tablet Take 1-2 tablets (5-10 mg total) by mouth every 3 (three) hours as needed for moderate pain, severe pain or breakthrough pain. 80 tablet 0  . Polyethyl Glycol-Propyl Glycol (SYSTANE OP) Apply 1-2 drops to eye at bedtime as needed (dry eyes.).    Marland Kitchen polyethylene glycol (MIRALAX / GLYCOLAX) packet Take 17 g by mouth daily as needed for mild constipation. 14 each 0  . raloxifene (EVISTA) 60 MG tablet Take 60 mg by mouth daily.    . sertraline (ZOLOFT) 100 MG tablet Take 100 mg by mouth 2 (two) times daily.     No current facility-administered medications for this visit.   Allergies:   Alendronate sodium; Celecoxib; and Latex   Social History:  The patient  reports that she has never smoked. She has never used smokeless tobacco. She reports that she does not drink alcohol or use illicit drugs.   Family History:  The patient's  family history includes Coronary artery disease in her brother; Diabetes in her brother and sister; Heart disease in her brother and father;  Pancreatic cancer in her daughter. There is no history of Colon cancer, Colon polyps, Esophageal cancer, or Gallbladder disease.   ROS:  Please see the history of present illness.  Otherwise, review of systems is positive for leg swelling, cough, diarrhea, depression, back pain, dizziness, easy bruising, fatigue, wheezing, anxiety.  All other systems are reviewed and negative.   PHYSICAL EXAM: VS:  BP 120/90 mmHg  Pulse 80  Ht 5\' 5"  (1.651 m)  Wt 185 lb 6.4 oz (84.097 kg)  BMI 30.85 kg/m2 , BMI Body mass index is 30.85 kg/(m^2). GEN: Well nourished, well developed, in no acute distress HEENT: normal Neck: no JVD, no masses. No carotid bruits Cardiac: RRR without murmur or gallop  Respiratory:  clear to auscultation bilaterally, normal work of breathing GI: soft, nontender, nondistended, + BS MS: no deformity or atrophy Ext: no pretibial edema, pedal pulses 2+= bilaterally Skin: warm and dry, no rash Neuro:  Strength and sensation are intact Psych: euthymic mood, full affect  EKG:  EKG is ordered today. The ekg ordered today shows normal sinus rhythm, rightward axis, otherwise within normal limits.  Recent Labs: 06/23/2014: BUN 16; Creatinine, Ser 1.06*; Potassium 3.9; Sodium 143 02/18/2015: Hemoglobin 12.1; Platelets 287.0   Lipid Panel     Component Value Date/Time   CHOL 173 05/28/2014 0948   TRIG 114.0 05/28/2014 0948   HDL 69.90 05/28/2014 0948   CHOLHDL 2 05/28/2014 0948   VLDL 22.8 05/28/2014 0948   LDLCALC 80 05/28/2014 0948      Wt Readings from Last 3 Encounters:  06/08/15 185 lb 6.4 oz (84.097 kg)  06/04/15 183 lb 4 oz (83.122 kg)  02/18/15 182 lb 12.8 oz (82.918 kg)    ASSESSMENT AND PLAN: 1.  Heart palpitations: The patient has worsening of her heart palpitations since I have seen her last. Her EKG is essentially unremarkable with the exception of a rightward axis which is unchanged from previous tracings. She has no history of structural heart  abnormality. There is clearly a stress related component. We'll check a 48-hour Holter monitor.  2. TIA: No new symptoms. She takes clopidogrel.  3. Shortness of breath: Unclear etiology. No evidence of volume overload on exam. Will check an echocardiogram.  4. Chest pain at rest: With multiple cardiovascular risk factors will check a pharmacologic nuclear scan.  Current medicines are reviewed with the patient today.  The patient does not have concerns regarding medicines.  Labs/ tests ordered today include:   Orders Placed This Encounter  Procedures  . Holter monitor - 48 hour  . Myocardial Perfusion Imaging  . EKG 12-Lead  . ECHOCARDIOGRAM COMPLETE    Disposition:   FU one year  Signed, Sherren Mocha, MD  06/08/2015 5:14 PM    [facility] [place], [place],   [address] Phone: [phone]; Fax: [phone]

## 2015-06-10 ENCOUNTER — Ambulatory Visit (INDEPENDENT_AMBULATORY_CARE_PROVIDER_SITE_OTHER): Payer: Medicare Other

## 2015-06-10 DIAGNOSIS — R0602 Shortness of breath: Secondary | ICD-10-CM | POA: Diagnosis not present

## 2015-06-10 DIAGNOSIS — R002 Palpitations: Secondary | ICD-10-CM | POA: Diagnosis not present

## 2015-06-10 DIAGNOSIS — R079 Chest pain, unspecified: Secondary | ICD-10-CM | POA: Diagnosis not present

## 2015-06-11 ENCOUNTER — Ambulatory Visit: Payer: Medicare Other | Admitting: Cardiovascular Disease

## 2015-06-11 ENCOUNTER — Telehealth: Payer: Self-pay | Admitting: Internal Medicine

## 2015-06-11 DIAGNOSIS — R059 Cough, unspecified: Secondary | ICD-10-CM

## 2015-06-11 DIAGNOSIS — R05 Cough: Secondary | ICD-10-CM | POA: Insufficient documentation

## 2015-06-11 NOTE — Telephone Encounter (Signed)
Pt notified of Dr. Glori Bickers Dr. Golden Pop comments and agrees with referral to pulmonary. Pt wants to see Dr. Chase Caller if possible since he sees pt's husband. Please put referral in and I advise pt our [facility] will call to schedule appt  (message sent to Dr. Chase Caller too as an Juluis Rainier)

## 2015-06-11 NOTE — Telephone Encounter (Signed)
Please let pt know that her husband's pulmonologist is concerned about her cough and thinks she would benefit from a pulmonary visit or a CT scan (I am leaning towards pulm ref) Please ask her if she is agreeable to this and let me know -thanks

## 2015-06-11 NOTE — Telephone Encounter (Signed)
Referral done

## 2015-06-11 NOTE — Telephone Encounter (Signed)
Dear Dr Clayborne Artist husband is apatient of mine for IPF. Apparnetly Earl Lites Recine  has chronic cough. They are very worried she too might have IPF. If so, my recommendation is pulmonary consult or HRCT chest wo contrast (High Resolution CT chest without contrast on ILD protocol. DO both Supine and Prone Images. Only  Dr Lorin Picket or Dr Salvatore Marvel Dr. Vinnie Langton to read. ) for chronic cough  THanks  Dr. Brand Males, M.D., [facility].C.P Pulmonary and Critical Care Medicine Staff Physician [facility] Pager: [phone], If no answer or between  15:00h - 7:00h: call [phone]  06/11/2015 3:21 PM

## 2015-06-19 ENCOUNTER — Telehealth (HOSPITAL_COMMUNITY): Payer: Self-pay

## 2015-06-19 NOTE — Telephone Encounter (Signed)
Patient given detailed instructions per Myocardial Perfusion Study Information Sheet for the test on 06/24/15 at 0945. Patient notified to arrive 15 minutes early and that it is imperative to arrive on time for appointment to keep from having the test rescheduled.  If you need to cancel or reschedule your appointment, please call the office within 24 hours of your appointment. Failure to do so may result in a cancellation of your appointment, and a $50 no show fee. Patient verbalized understanding.T. Sydelle Sherfield, CNMT, RT-N

## 2015-06-22 ENCOUNTER — Telehealth (HOSPITAL_COMMUNITY): Payer: Self-pay | Admitting: *Deleted

## 2015-06-22 NOTE — Telephone Encounter (Signed)
Patient given detailed instructions per Myocardial Perfusion Study Information Sheet for the test on 06/24/15 at 0945. Patient notified to arrive 15 minutes early and that it is imperative to arrive on time for appointment to keep from having the test rescheduled.  If you need to cancel or reschedule your appointment, please call the office within 24 hours of your appointment. Failure to do so may result in a cancellation of your appointment, and a $50 no show fee. Patient verbalized understanding.Verity Gilcrest, Ranae Palms

## 2015-06-23 ENCOUNTER — Encounter: Payer: Self-pay | Admitting: Family Medicine

## 2015-06-23 ENCOUNTER — Ambulatory Visit (INDEPENDENT_AMBULATORY_CARE_PROVIDER_SITE_OTHER): Payer: Medicare Other | Admitting: Family Medicine

## 2015-06-23 ENCOUNTER — Ambulatory Visit: Payer: Medicare Other | Admitting: Family Medicine

## 2015-06-23 VITALS — BP 116/84 | HR 73 | Temp 98.1°F | Ht 64.5 in | Wt 185.5 lb

## 2015-06-23 DIAGNOSIS — F4323 Adjustment disorder with mixed anxiety and depressed mood: Secondary | ICD-10-CM | POA: Diagnosis not present

## 2015-06-23 NOTE — Assessment & Plan Note (Addendum)
Pt is having an understandably difficult time caring for terminally ill husband  Suspect this is worsening her palpitations -enc her to continue cardiac w/u (encoraging 48 hour holter so far) Reviewed stressors/ coping techniques/symptoms/ support sources/ tx options and side effects in detail today She is at the max dose of zoloft I feel comfortable with (100 mg bid) as well as prn xanax  Coping mech include walking short distances/outdoor time/talking to family (phone or other)/ reading  Enc writing in a journal again Enc seeking out info on mindfulness and mediation  Referred for counseling with psychologist  Ref to counselor to disc above / early grief/ caregiver stress and feelings of guilt when not with her husband  >25 minutes spent in face to face time with patient, >50% spent in counselling or coordination of care  No SI - inst to seek help immed if she feels more depressed

## 2015-06-23 NOTE — Progress Notes (Signed)
Subjective:    Patient ID: [name] [name], female    DOB: 11-03-1934, 81 y.o.   MRN: [id number]  HPI Here with stress reaction   Having more palpitations due to anxiety  Had 48 hour monitor - some pvc/pac   Has myocardial perfusion test planned for tomorrow   Stressor- husband has pulmonary fibrosis - ? Cause Getting much worse  Still treating aggressively  She is afraid he is dying  He was told to get his affairs in order -this scared her  Also some other family health issues  It is progressing - he is wearing 02 all the time  Upsetting- since he was formerly healthy   She is taking care of her husband  Has to cook for him since he is unable  Not bathing or dressing him yet   She is not taking care of herself   She takes a xanax at night Reads at night  She does not get outdoors- she enjoys that (can no longer stoop to garden but she can get out with walker a little bit at a time)   Support - she talks to her daughter and grand daughter and grand son  Always available by phone  It helps to vent a little   She has not had a journal - she has thought about it   Feels guilty if she is not with husband every minute  Did go out of town this past weekend and enjoyed it   She is irritable and having concentration problems   Takes zoloft 100 mg bid and xanax qhs (or occ1/2 pill during the day)   May have had counseling years ago    Patient Active Problem List   Diagnosis Date Noted  . Cough 06/11/2015  . Right-sided headache 02/18/2015  . Right ear pain 02/18/2015  . Encounter for Medicare annual wellness exam 06/04/2014  . Obesity 06/04/2014  . Estrogen deficiency 06/04/2014  . Colon cancer screening 06/04/2014  . Dysphagia, pharyngoesophageal phase 03/27/2014  . Schatzki's ring 03/27/2014  . Patellar fracture 02/21/2014  . Patellar tendon rupture 02/21/2014  . Acute blood loss anemia 02/17/2014  . Hypokalemia 02/17/2014  . Pre-operative examination  12/27/2013  . Dysphagia 10/17/2011  . OA (osteoarthritis) of knee 08/16/2011  . Hemorrhoids 08/10/2011  . Other dyspnea and respiratory abnormality 06/21/2010  . Urinary incontinence 05/27/2010  . OVERACTIVE BLADDER 03/12/2010  . DEGENERATIVE DISC DISEASE 04/13/2009  . GERD 10/15/2008  . CEREBROVASCULAR ACCIDENT, HX OF 10/15/2008  . ARTHRITIS, GENERALIZED 06/26/2008  . EXTERNAL HEMORRHOIDS 02/01/2008  . HIATAL HERNIA 02/01/2008  . DIVERTICULOSIS OF COLON 02/01/2008  . GASTRITIS, HX OF 02/01/2008  . Vitamin D deficiency 07/03/2007  . Osteopenia 06/13/2007  . Fatigue 06/13/2007  . HELICOBACTER PYLORI INFECTION 06/06/2006  . Hyperlipidemia 06/06/2006  . Generalized anxiety disorder 06/06/2006  . Adjustment disorder with mixed anxiety and depressed mood 06/06/2006  . MACULAR DEGENERATION 06/06/2006  . CVA 06/06/2006  . OSTEOARTHRITIS 06/06/2006  . PALPITATIONS 05/25/2006   Past Medical History  Diagnosis Date  . Palpitations   . Esophageal reflux   . Depressive disorder, not elsewhere classified   . Anxiety state, unspecified   . Diaphragmatic hernia without mention of obstruction or gangrene   . External hemorrhoids without mention of complication   . Diverticulosis of colon (without mention of hemorrhage)   . Unspecified vitamin D deficiency   . Helicobacter pylori (H. pylori)   . Macular degeneration (senile) of retina, unspecified   . Shortness  of breath 08-11-11    with exertion only at present  . Osteoarthrosis, unspecified whether generalized or localized, unspecified site 08-11-11    hx. "rhematoid arthritis", osteoarthritis, DDD, bursitis(hip)  . Lumbar spondylosis   . Complication of anesthesia     slow to wake up with surgery several years ago   . PONV (postoperative nausea and vomiting)   . Hiatal hernia   . Schatzki's ring   . Unspecified cerebral artery occlusion with cerebral infarction 08-11-11    '97/ '06( TIA)-affected lt. side, slight weakness L side  .  Osteoporosis   . Alternating constipation and diarrhea   . Frequency of urination   . History of transfusion   . Difficulty sleeping   . Fatigue    Past Surgical History  Procedure Laterality Date  . Appendectomy    . Tubal ligation    . Knee surgery  08-11-11    rt. knee scope  . Cataract extraction  08-11-11    Bilateral  . Back surgery  08-11-11    '11-hx. lumbar fusion with retained hardware  . Abdominal hysterectomy  08-11-11  . Total knee arthroplasty  08/16/2011    Procedure: TOTAL KNEE ARTHROPLASTY;  Surgeon: Gearlean Alf, MD;  Location: [facility];  Service: Orthopedics;  Laterality: Right;  . Total knee arthroplasty Left 01/27/2014    Procedure: LEFT TOTAL KNEE ARTHROPLASTY;  Surgeon: Gearlean Alf, MD;  Location: [facility];  Service: Orthopedics;  Laterality: Left;  . Patellar tendon repair Left 02/22/2014    Procedure: PATELLA TENDON REPAIR;  Surgeon: Gearlean Alf, MD;  Location: [facility];  Service: Orthopedics;  Laterality: Left;  . Esophageal dilation    . Patellar tendon repair Left 06/25/2014    Procedure: LEFT PATELLA TENDON REPAIR;  Surgeon: Gaynelle Arabian, MD;  Location: [facility];  Service: Orthopedics;  Laterality: Left;   Social History  Substance Use Topics  . Smoking status: Never Smoker   . Smokeless tobacco: Never Used  . Alcohol Use: No   Family History  Problem Relation Age of Onset  . Heart disease Father   . Diabetes Sister   . Coronary artery disease Brother   . Diabetes Brother   . Heart disease Brother   . Pancreatic cancer Daughter   . Colon cancer Neg Hx   . Colon polyps Neg Hx   . Esophageal cancer Neg Hx   . Gallbladder disease Neg Hx    Allergies  Allergen Reactions  . Alendronate Sodium     REACTION: JAW PAIN  . Celecoxib     REACTION: GI UPSET  . Nexium [Esomeprazole Magnesium] Nausea And Vomiting    Pt cannot take the generic Does fine with DAW  . Latex Itching and Rash   Current Outpatient Prescriptions on File Prior to Visit    Medication Sig Dispense Refill  . acetaminophen (TYLENOL) 325 MG tablet Take 2 tablets (650 mg total) by mouth every 6 (six) hours as needed for mild pain (or Fever >/= 101). 40 tablet 0  . ALPRAZolam (XANAX) 0.5 MG tablet Take 1 tablet (0.5 mg total) by mouth at bedtime as needed for sleep. 90 tablet 0  . atorvastatin (LIPITOR) 10 MG tablet Take 10 mg by mouth daily.    Marland Kitchen azithromycin (ZITHROMAX) 250 MG tablet 2 tabs by mouth on day 1 followed by 1 tab by mouth daily days 2- 5 6 tablet 0  . calcium carbonate (TUMS - DOSED IN MG ELEMENTAL CALCIUM) 500 MG chewable tablet Chew  1 tablet (200 mg of elemental calcium total) by mouth 4 (four) times daily as needed for indigestion or heartburn. 10 tablet 0  . clopidogrel (PLAVIX) 75 MG tablet Take 75 mg by mouth daily.    Marland Kitchen esomeprazole (NEXIUM) 40 MG capsule Take 40 mg by mouth 2 (two) times daily before a meal.    . fluticasone (FLONASE) 50 MCG/ACT nasal spray Place 1-2 sprays into both nostrils daily as needed for allergies or rhinitis.    Marland Kitchen HYDROcodone-acetaminophen (NORCO) 10-325 MG tablet Take 1 tablet by mouth every 6 (six) hours as needed. pain  0  . methocarbamol (ROBAXIN) 500 MG tablet Take 1 tablet (500 mg total) by mouth every 6 (six) hours as needed for muscle spasms. 80 tablet 0  . Multiple Vitamins-Minerals (PRESERVISION AREDS PO) Take 1 capsule by mouth 2 (two) times daily.    Marland Kitchen oxyCODONE (OXY IR/ROXICODONE) 5 MG immediate release tablet Take 1-2 tablets (5-10 mg total) by mouth every 3 (three) hours as needed for moderate pain, severe pain or breakthrough pain. 80 tablet 0  . Polyethyl Glycol-Propyl Glycol (SYSTANE OP) Apply 1-2 drops to eye at bedtime as needed (dry eyes.).    Marland Kitchen polyethylene glycol (MIRALAX / GLYCOLAX) packet Take 17 g by mouth daily as needed for mild constipation. 14 each 0  . raloxifene (EVISTA) 60 MG tablet Take 60 mg by mouth daily.    . sertraline (ZOLOFT) 100 MG tablet Take 100 mg by mouth 2 (two) times daily.      No current facility-administered medications on file prior to visit.     Review of Systems Review of Systems  Constitutional: Negative for fever, appetite change, fatigue and unexpected weight change.  Eyes: Negative for pain and visual disturbance.  Respiratory: Negative for cough and shortness of breath.   Cardiovascular: Negative for cp and pos for stress related palpitations    Gastrointestinal: Negative for nausea, diarrhea and constipation.  Genitourinary: Negative for urgency and frequency.  Skin: Negative for pallor or rash   Neurological: Negative for weakness, light-headedness, numbness and headaches.  Hematological: Negative for adenopathy. Does not bruise/bleed easily.  Psychiatric/Behavioral: pos for dysphoric mood and anx and tearfulness w/o SI or hopeless feeling or anhedonia         Objective:   Physical Exam  Constitutional: She appears well-developed and well-nourished. She appears distressed.  obese and well appearing   Emotionally distressed  HENT:  Head: Normocephalic and atraumatic.  Eyes: Conjunctivae and EOM are normal. Pupils are equal, round, and reactive to light.  Cardiovascular: Normal rate and regular rhythm.   Neurological: She is alert. No cranial nerve deficit.  No tremor   Skin: Skin is warm and dry. No rash noted. No erythema.  Psychiatric: Her speech is normal and behavior is normal. Thought content normal. Her mood appears anxious. Her affect is not blunt, not labile and not inappropriate. Thought content is not paranoid. Cognition and memory are normal. She exhibits a depressed mood. She expresses no homicidal and no suicidal ideation.  Tearful often during conversation Eager to discuss her stressors           Assessment & Plan:   Problem List Items Addressed This Visit      Other   Adjustment disorder with mixed anxiety and depressed mood - Primary    Pt is having an understandably difficult time caring for terminally ill  husband  Suspect this is worsening her palpitations -enc her to continue cardiac w/u (encoraging 48 hour holter  so far) Reviewed stressors/ coping techniques/symptoms/ support sources/ tx options and side effects in detail today She is at the max dose of zoloft I feel comfortable with (100 mg bid) as well as prn xanax  Coping mech include walking short distances/outdoor time/talking to family (phone or other)/ reading  Enc writing in a journal again Enc seeking out info on mindfulness and mediation  Referred for counseling with psychologist  Ref to counselor to disc above / early grief/ caregiver stress and feelings of guilt when not with her husband  >25 minutes spent in face to face time with patient, >50% spent in counselling or coordination of care  No SI - inst to seek help immed if she feels more depressed       Relevant Orders   Ambulatory referral to Psychology

## 2015-06-23 NOTE — Progress Notes (Signed)
Pre visit review using our clinic review tool, if applicable. No additional management support is needed unless otherwise documented below in the visit note. 

## 2015-06-23 NOTE — Patient Instructions (Signed)
Your reaction to the current situation is normal - and feeling your emotions is important even if it is unpleasant Try and take frequent breaks to get outdoors for short amounts of time Physical activity as tolerated  Try writing in a journal again - you can rip up the paper after you have written on it  Check out the [employer]oss[employer]- to learn about meditation and mindfulness - this will help you relax and concentrate  I would like to try setting you up with a counselor - to help you get through this tough time  Continue your current medicines as dosed   Stop at check out for referral to counseling  Keep me posted

## 2015-06-24 ENCOUNTER — Ambulatory Visit (HOSPITAL_BASED_OUTPATIENT_CLINIC_OR_DEPARTMENT_OTHER): Payer: Medicare Other

## 2015-06-24 ENCOUNTER — Other Ambulatory Visit: Payer: Self-pay

## 2015-06-24 ENCOUNTER — Ambulatory Visit (HOSPITAL_COMMUNITY): Payer: Medicare Other | Attending: Cardiovascular Disease

## 2015-06-24 DIAGNOSIS — R002 Palpitations: Secondary | ICD-10-CM | POA: Insufficient documentation

## 2015-06-24 DIAGNOSIS — R079 Chest pain, unspecified: Secondary | ICD-10-CM | POA: Insufficient documentation

## 2015-06-24 DIAGNOSIS — Z8249 Family history of ischemic heart disease and other diseases of the circulatory system: Secondary | ICD-10-CM | POA: Insufficient documentation

## 2015-06-24 DIAGNOSIS — R0602 Shortness of breath: Secondary | ICD-10-CM | POA: Diagnosis not present

## 2015-06-24 DIAGNOSIS — R9439 Abnormal result of other cardiovascular function study: Secondary | ICD-10-CM | POA: Diagnosis not present

## 2015-06-24 DIAGNOSIS — I351 Nonrheumatic aortic (valve) insufficiency: Secondary | ICD-10-CM | POA: Insufficient documentation

## 2015-06-24 DIAGNOSIS — R0609 Other forms of dyspnea: Secondary | ICD-10-CM | POA: Diagnosis not present

## 2015-06-24 DIAGNOSIS — I517 Cardiomegaly: Secondary | ICD-10-CM | POA: Diagnosis not present

## 2015-06-24 DIAGNOSIS — I340 Nonrheumatic mitral (valve) insufficiency: Secondary | ICD-10-CM | POA: Diagnosis not present

## 2015-06-24 DIAGNOSIS — I5189 Other ill-defined heart diseases: Secondary | ICD-10-CM | POA: Insufficient documentation

## 2015-06-24 DIAGNOSIS — I358 Other nonrheumatic aortic valve disorders: Secondary | ICD-10-CM | POA: Diagnosis not present

## 2015-06-24 DIAGNOSIS — R0600 Dyspnea, unspecified: Secondary | ICD-10-CM | POA: Diagnosis present

## 2015-06-24 LAB — MYOCARDIAL PERFUSION IMAGING
CHL CUP NUCLEAR SDS: 1
CHL CUP NUCLEAR SRS: 4
CHL CUP RESTING HR STRESS: 54 {beats}/min
LHR: 0.39
LV sys vol: 25 mL
LVDIAVOL: 72 mL (ref 46–106)
Peak HR: 92 {beats}/min
SSS: 5
TID: 1.02

## 2015-06-24 LAB — ECHOCARDIOGRAM COMPLETE
AVPHT: 545 ms
CHL CUP RV SYS PRESS: 29 mmHg
EERAT: 8.28
EWDT: 264 ms
FS: 35 % (ref 28–44)
IVS/LV PW RATIO, ED: 0.89
LA diam end sys: 45 mm
LA vol A4C: 69 ml
LA vol: 76 mL
LADIAMINDEX: 2.27 cm/m2
LASIZE: 45 mm
LAVOLIN: 38.3 mL/m2
LV E/e' medial: 8.28
LV E/e'average: 8.28
LV PW d: 10.6 mm — AB (ref 0.6–1.1)
LV TDI E'MEDIAL: 5.7
LV e' LATERAL: 8.88 cm/s
LVOT VTI: 27.2 cm
LVOT area: 2.84 cm2
LVOT peak vel: 107 cm/s
LVOTD: 19 mm
LVOTSV: 77 mL
MV Dec: 264
MV Peak grad: 2 mmHg
MV pk E vel: 73.5 m/s
MVPKAVEL: 70.1 m/s
Peak grad: 257 mmHg
RV LATERAL S' VELOCITY: 13.2 cm/s
Reg peak vel: 257 cm/s
TDI e' lateral: 8.88
TRMAXVEL: 257 cm/s

## 2015-06-24 MED ORDER — AMINOPHYLLINE 25 MG/ML IV SOLN
75.0000 mg | Freq: Once | INTRAVENOUS | Status: AC
  Administered 2015-06-24: 75 mg via INTRAVENOUS

## 2015-06-24 MED ORDER — TECHNETIUM TC 99M TETROFOSMIN IV KIT
10.8000 | PACK | Freq: Once | INTRAVENOUS | Status: AC | PRN
  Administered 2015-06-24: 11 via INTRAVENOUS
  Filled 2015-06-24: qty 11

## 2015-06-24 MED ORDER — TECHNETIUM TC 99M TETROFOSMIN IV KIT
32.9000 | PACK | Freq: Once | INTRAVENOUS | Status: AC | PRN
  Administered 2015-06-24: 32.9 via INTRAVENOUS
  Filled 2015-06-24: qty 33

## 2015-06-24 MED ORDER — REGADENOSON 0.4 MG/5ML IV SOLN
0.4000 mg | Freq: Once | INTRAVENOUS | Status: AC
  Administered 2015-06-24: 0.4 mg via INTRAVENOUS

## 2015-07-01 ENCOUNTER — Ambulatory Visit: Payer: Medicare Other | Admitting: Psychology

## 2015-07-02 ENCOUNTER — Telehealth: Payer: Self-pay

## 2015-07-02 MED ORDER — METOPROLOL SUCCINATE ER 25 MG PO TB24: 12.5000 mg | ORAL_TABLET | Freq: Every day | ORAL | Status: DC

## 2015-07-02 NOTE — Telephone Encounter (Signed)
Reviewed. With PVC's and palpitations, could try Toprol XL 12.5 mg at HS

## 2015-07-02 NOTE — Telephone Encounter (Signed)
-----   Message from Sherren Mocha, MD sent at 06/30/2015  6:49 AM EDT ----- Low-risk nuclear scan with probable attenuation artifact and no ischemia. Echo with normal LV function, no significant valvular disease, normal pulmonary pressures, and moderate diastolic dysfunction. Recommend lifestyle modification. No medication changes indicated at this time.

## 2015-07-04 ENCOUNTER — Other Ambulatory Visit: Payer: Self-pay | Admitting: Family Medicine

## 2015-07-06 NOTE — Telephone Encounter (Signed)
Pt was here on 06/23/15 to discuss anxiety, please advise

## 2015-07-06 NOTE — Telephone Encounter (Signed)
Please refill for a year  

## 2015-07-06 NOTE — Telephone Encounter (Signed)
done

## 2015-07-23 DIAGNOSIS — S86812D Strain of other muscle(s) and tendon(s) at lower leg level, left leg, subsequent encounter: Secondary | ICD-10-CM | POA: Diagnosis not present

## 2015-07-23 DIAGNOSIS — Z4789 Encounter for other orthopedic aftercare: Secondary | ICD-10-CM | POA: Diagnosis not present

## 2015-08-05 ENCOUNTER — Ambulatory Visit (INDEPENDENT_AMBULATORY_CARE_PROVIDER_SITE_OTHER): Payer: Medicare Other | Admitting: Internal Medicine

## 2015-08-05 ENCOUNTER — Encounter: Payer: Self-pay | Admitting: Internal Medicine

## 2015-08-05 VITALS — BP 126/72 | HR 99 | Ht 64.5 in | Wt 186.0 lb

## 2015-08-05 DIAGNOSIS — R05 Cough: Secondary | ICD-10-CM

## 2015-08-05 DIAGNOSIS — R0600 Dyspnea, unspecified: Secondary | ICD-10-CM

## 2015-08-05 DIAGNOSIS — R0989 Other specified symptoms and signs involving the circulatory and respiratory systems: Secondary | ICD-10-CM

## 2015-08-05 DIAGNOSIS — R0689 Other abnormalities of breathing: Secondary | ICD-10-CM

## 2015-08-05 DIAGNOSIS — R059 Cough, unspecified: Secondary | ICD-10-CM

## 2015-08-05 NOTE — Progress Notes (Signed)
Subjective:    Patient ID: [name] [name], female    DOB: [birth year], 81 y.o.   MRN: [id number] \  PCP Loura Pardon, MD  HPI  OV 08/05/2015  Chief Complaint  Patient presents with  . Advice Only    Referred for cough by Dr. Glori Bickers.  Pt c/o worsening sob, cough X3-4 months.       81 year old female who is the wife of my patient Mr. Oletha Cruel was idiopathic pulmonary fibrosis. Patient's granddaughter tells he is also friends with Dr. Legrand Como wert's daughter. She's been referred for cough and dyspnea. Her husband is extremely concerned that she also has pulmonary fibrosis due to common exposures. Patient denies any family history of pulmonary fibrosis. Reports insidious onset of shortness of breath and cough simultaneously approximately 3 months ago. It is progressive. Moderate in intensity. Cough quality is dry. Both symptoms are present with exertion relieved by rest. Walking across the hallway in the room with her baseline walker use produces dyspnea [she uses walker because of knee and back issues for the last few years] there is no associated syncope or wheezing. She did have a cardiac stress is 06/24/2015 by Dr. Sherren Mocha I review this result myself and this is normal with an echocardiogram normal LV function. Last chest x-ray in our health system was 01/20/2014 with clear lung fields personally visualized the chest x-ray. Lab work shows hemoglobin of 12.1 g percent or greater 2017 and a creatinine of 1.06 mg percent June 2016.   Social history: Her husband likely renal stone has been [facility] at [facility] long  Walking desaturation test 185 feet 3 labs on room air: Walked only 1 lap and stopped due to knee pain. She is a walker to walk. For this discussion she did not desaturate.   has a past medical history of Palpitations; Esophageal reflux; Depressive disorder, not elsewhere classified; Anxiety state, unspecified; Diaphragmatic hernia without mention of obstruction or  gangrene; External hemorrhoids without mention of complication; Diverticulosis of colon (without mention of hemorrhage); Unspecified vitamin D deficiency; Helicobacter pylori (H. pylori); Macular degeneration (senile) of retina, unspecified; Shortness of breath (08-11-11); Osteoarthrosis, unspecified whether generalized or localized, unspecified site (08-11-11); Lumbar spondylosis; Complication of anesthesia; PONV (postoperative nausea and vomiting); Hiatal hernia; Schatzki's ring; Unspecified cerebral artery occlusion with cerebral infarction (08-11-11); Osteoporosis; Alternating constipation and diarrhea; Frequency of urination; History of transfusion; Difficulty sleeping; and Fatigue.   reports that she has never smoked. She has never used smokeless tobacco.  Past Surgical History  Procedure Laterality Date  . Appendectomy    . Tubal ligation    . Knee surgery  08-11-11    rt. knee scope  . Cataract extraction  08-11-11    Bilateral  . Back surgery  08-11-11    '11-hx. lumbar fusion with retained hardware  . Abdominal hysterectomy  08-11-11  . Total knee arthroplasty  08/16/2011    Procedure: TOTAL KNEE ARTHROPLASTY;  Surgeon: Gearlean Alf, MD;  Location: [facility];  Service: Orthopedics;  Laterality: Right;  . Total knee arthroplasty Left 01/27/2014    Procedure: LEFT TOTAL KNEE ARTHROPLASTY;  Surgeon: Gearlean Alf, MD;  Location: [facility];  Service: Orthopedics;  Laterality: Left;  . Patellar tendon repair Left 02/22/2014    Procedure: PATELLA TENDON REPAIR;  Surgeon: Gearlean Alf, MD;  Location: [facility];  Service: Orthopedics;  Laterality: Left;  . Esophageal dilation    . Patellar tendon repair Left 06/25/2014    Procedure: LEFT PATELLA TENDON REPAIR;  Surgeon: Gaynelle Arabian, MD;  Location: [facility];  Service: Orthopedics;  Laterality: Left;    Allergies  Allergen Reactions  . Alendronate Sodium     REACTION: JAW PAIN  . Celecoxib     REACTION: GI UPSET  . Nexium [Esomeprazole Magnesium]  Nausea And Vomiting    Pt cannot take the generic Does fine with DAW  . Latex Itching and Rash    Immunization History  Administered Date(s) Administered  . Influenza Split 10/17/2011  . Influenza Whole 10/24/2003, 10/19/2006, 10/15/2007, 11/15/2008, 10/17/2009  . Influenza,inj,Quad PF,36+ Mos 09/19/2012, 11/26/2013  . Influenza-Unspecified 11/10/2014  . Pneumococcal Conjugate-13 05/07/2013  . Pneumococcal Polysaccharide-23 01/03/2002, 06/13/2007  . Td 10/09/1996, 06/13/2007  . Zoster 03/21/2013    Family History  Problem Relation Age of Onset  . Heart disease Father   . Diabetes Sister   . Coronary artery disease Brother   . Diabetes Brother   . Heart disease Brother   . Pancreatic cancer Daughter   . Colon cancer Neg Hx   . Colon polyps Neg Hx   . Esophageal cancer Neg Hx   . Gallbladder disease Neg Hx      Current outpatient prescriptions:  .  acetaminophen (TYLENOL) 325 MG tablet, Take 2 tablets (650 mg total) by mouth every 6 (six) hours as needed for mild pain (or Fever >/= 101)., Disp: 40 tablet, Rfl: 0 .  ALPRAZolam (XANAX) 0.5 MG tablet, Take 1 tablet (0.5 mg total) by mouth at bedtime as needed for sleep., Disp: 90 tablet, Rfl: 0 .  atorvastatin (LIPITOR) 10 MG tablet, Take 10 mg by mouth daily., Disp: , Rfl:  .  calcium carbonate (TUMS - DOSED IN MG ELEMENTAL CALCIUM) 500 MG chewable tablet, Chew 1 tablet (200 mg of elemental calcium total) by mouth 4 (four) times daily as needed for indigestion or heartburn., Disp: 10 tablet, Rfl: 0 .  clopidogrel (PLAVIX) 75 MG tablet, Take 75 mg by mouth daily., Disp: , Rfl:  .  esomeprazole (NEXIUM) 40 MG capsule, Take 40 mg by mouth 2 (two) times daily before a meal., Disp: , Rfl:  .  fluticasone (FLONASE) 50 MCG/ACT nasal spray, Place 1-2 sprays into both nostrils daily as needed for allergies or rhinitis., Disp: , Rfl:  .  HYDROcodone-acetaminophen (NORCO) 10-325 MG tablet, Take 1 tablet by mouth every 6 (six) hours as  needed. pain, Disp: , Rfl: 0 .  methocarbamol (ROBAXIN) 500 MG tablet, Take 1 tablet (500 mg total) by mouth every 6 (six) hours as needed for muscle spasms., Disp: 80 tablet, Rfl: 0 .  metoprolol succinate (TOPROL XL) 25 MG 24 hr tablet, Take 0.5 tablets (12.5 mg total) by mouth at bedtime., Disp: 90 tablet, Rfl: 3 .  Multiple Vitamins-Minerals (PRESERVISION AREDS PO), Take 1 capsule by mouth 2 (two) times daily., Disp: , Rfl:  .  oxyCODONE (OXY IR/ROXICODONE) 5 MG immediate release tablet, Take 1-2 tablets (5-10 mg total) by mouth every 3 (three) hours as needed for moderate pain, severe pain or breakthrough pain., Disp: 80 tablet, Rfl: 0 .  Polyethyl Glycol-Propyl Glycol (SYSTANE OP), Apply 1-2 drops to eye at bedtime as needed (dry eyes.)., Disp: , Rfl:  .  raloxifene (EVISTA) 60 MG tablet, Take 60 mg by mouth daily., Disp: , Rfl:  .  sertraline (ZOLOFT) 100 MG tablet, TAKE 2 TABLETS DAILY, Disp: 180 tablet, Rfl: 3     Review of Systems  Constitutional: Negative for fever and unexpected weight change.  HENT: Positive for congestion, postnasal  drip and sinus pressure. Negative for dental problem, ear pain, nosebleeds, rhinorrhea, sneezing, sore throat and trouble swallowing.   Eyes: Negative for redness and itching.  Respiratory: Positive for cough, chest tightness and shortness of breath. Negative for wheezing.   Cardiovascular: Positive for palpitations. Negative for leg swelling.  Gastrointestinal: Negative for nausea and vomiting.  Genitourinary: Negative for dysuria.  Musculoskeletal: Negative for joint swelling.  Skin: Negative for rash.  Neurological: Negative for headaches.  Hematological: Does not bruise/bleed easily.  Psychiatric/Behavioral: Negative for dysphoric mood. The patient is not nervous/anxious.        Objective:   Physical Exam  Constitutional: She is oriented to person, place, and time. She appears well-developed and well-nourished. No distress.  HENT:  Head:  Normocephalic and atraumatic.  Right Ear: External ear normal.  Left Ear: External ear normal.  Mouth/Throat: Oropharynx is clear and moist. No oropharyngeal exudate.  Eyes: Conjunctivae and EOM are normal. Pupils are equal, round, and reactive to light. Right eye exhibits no discharge. Left eye exhibits no discharge. No scleral icterus.  Neck: Normal range of motion. Neck supple. No JVD present. No tracheal deviation present. No thyromegaly present.  Cardiovascular: Normal rate, regular rhythm, normal heart sounds and intact distal pulses.  Exam reveals no gallop and no friction rub.   No murmur heard. Pulmonary/Chest: Effort normal. No respiratory distress. She has no wheezes. She has rales. She exhibits no tenderness.  Has bilateral bibasal crackles  Abdominal: Soft. Bowel sounds are normal. She exhibits no distension and no mass. There is no tenderness. There is no rebound and no guarding.  Musculoskeletal: Normal range of motion. She exhibits no edema or tenderness.  Uses walker Lean forward gait  Lymphadenopathy:    She has no cervical adenopathy.  Neurological: She is alert and oriented to person, place, and time. She has normal reflexes. No cranial nerve deficit. She exhibits normal muscle tone. Coordination normal.  Skin: Skin is warm and dry. No rash noted. She is not diaphoretic. No erythema. No pallor.  Psychiatric: She has a normal mood and affect. Her behavior is normal. Judgment and thought content normal.  Vitals reviewed.   Filed Vitals:   08/05/15 1421  BP: 126/72  Pulse: 99  Height: 5' 4.5" (1.638 m)  Weight: 186 lb (84.369 kg)  SpO2: 95%         Assessment & Plan:     ICD-9-CM ICD-10-CM   1. Dyspnea and respiratory abnormality 786.09 R06.00 CT Chest High Resolution    R06.89 Pulse oximetry, overnight     Pulmonary function test     CANCELED: CT Chest High Resolution  2. Cough 786.2 R05   3. Bibasilar crackles 786.7 R09.89     With a combination of  dyspnea cough and bilateral crackles interstitial lung disease is to be ruled out. Therefore we will get pulmonary function test, high-resolution CT chest, walking desaturation test and overnight pulse oximetry test on room air and then regroup. If this is negative then we will do exhaled nitric oxide testing  She is in understanding and agreement with the plan  If she has fibrosis this will be my first husband wife combination with pulmonary fibrosis   Dr. Brand Males, M.D., [facility].C.P Pulmonary and Critical Care Medicine Staff Physician [facility] Pager: [phone], If no answer or between  15:00h - 7:00h: call [phone]  08/05/2015 2:55 PM

## 2015-08-05 NOTE — Patient Instructions (Signed)
ICD-9-CM ICD-10-CM   1. Dyspnea and respiratory abnormality 786.09 R06.00     R06.89   2. Cough 786.2 R05   3. Bibasilar crackles 786.7 R09.89    Do HRCT  @ [facility] next few dasy  High Resolution CT chest without contrast on ILD protocol. DO both Supine and Prone Images. Only  Dr Lorin Picket or Dr Salvatore Marvel Dr. Vinnie Langton to read.   Do full PFT   Do ONO test room air  Do walking test on room air  Followup  - next week or so to regroup after completeing above

## 2015-08-07 ENCOUNTER — Ambulatory Visit (HOSPITAL_COMMUNITY)
Admission: RE | Admit: 2015-08-07 | Discharge: 2015-08-07 | Disposition: A | Discharge status: Home / Self Care | Payer: Medicare Other | Source: Ambulatory Visit | Attending: Internal Medicine | Admitting: Internal Medicine

## 2015-08-07 DIAGNOSIS — R0689 Other abnormalities of breathing: Secondary | ICD-10-CM | POA: Insufficient documentation

## 2015-08-07 DIAGNOSIS — R0600 Dyspnea, unspecified: Secondary | ICD-10-CM | POA: Insufficient documentation

## 2015-08-07 LAB — PULMONARY FUNCTION TEST
DL/VA % pred: 77 %
DL/VA: 3.89 ml/min/mmHg/L
DLCO unc % pred: 70 %
DLCO unc: 18.87 ml/min/mmHg
FEF 25-75 Post: 2.6 L/sec
FEF 25-75 Pre: 1.58 L/sec
FEF2575-%CHANGE-POST: 65 %
FEF2575-%PRED-POST: 162 %
FEF2575-%PRED-PRE: 98 %
FEV1-%CHANGE-POST: 9 %
FEV1-%Pred-Post: 109 %
FEV1-%Pred-Pre: 100 %
FEV1-PRE: 2.19 L
FEV1-Post: 2.4 L
FEV1FVC-%CHANGE-POST: 8 %
FEV1FVC-%PRED-PRE: 99 %
FEV6-%Change-Post: 0 %
FEV6-%PRED-PRE: 106 %
FEV6-%Pred-Post: 106 %
FEV6-Post: 2.96 L
FEV6-Pre: 2.95 L
FEV6FVC-%Change-Post: 0 %
FEV6FVC-%Pred-Post: 104 %
FEV6FVC-%Pred-Pre: 104 %
FVC-%CHANGE-POST: 0 %
FVC-%PRED-POST: 102 %
FVC-%PRED-PRE: 102 %
FVC-POST: 3.01 L
FVC-PRE: 2.98 L
PRE FEV1/FVC RATIO: 73 %
PRE FEV6/FVC RATIO: 99 %
Post FEV1/FVC ratio: 80 %
Post FEV6/FVC ratio: 99 %
RV % pred: 120 %
RV: 2.95 L
TLC % pred: 103 %
TLC: 5.53 L

## 2015-08-07 MED ORDER — ALBUTEROL SULFATE (2.5 MG/3ML) 0.083% IN NEBU
2.5000 mg | INHALATION_SOLUTION | Freq: Once | RESPIRATORY_TRACT | Status: AC
  Administered 2015-08-07: 2.5 mg via RESPIRATORY_TRACT

## 2015-08-11 ENCOUNTER — Encounter: Payer: Self-pay | Admitting: Internal Medicine

## 2015-08-12 ENCOUNTER — Other Ambulatory Visit: Payer: Medicare Other

## 2015-08-17 DIAGNOSIS — R0600 Dyspnea, unspecified: Secondary | ICD-10-CM | POA: Diagnosis not present

## 2015-08-18 ENCOUNTER — Ambulatory Visit
Admission: RE | Admit: 2015-08-18 | Discharge: 2015-08-18 | Disposition: A | Discharge status: Home / Self Care | Payer: Medicare Other | Source: Ambulatory Visit | Attending: Internal Medicine | Admitting: Internal Medicine

## 2015-08-18 DIAGNOSIS — R0600 Dyspnea, unspecified: Secondary | ICD-10-CM

## 2015-08-18 DIAGNOSIS — R918 Other nonspecific abnormal finding of lung field: Secondary | ICD-10-CM | POA: Diagnosis not present

## 2015-08-18 DIAGNOSIS — R0689 Other abnormalities of breathing: Principal | ICD-10-CM

## 2015-08-25 NOTE — Progress Notes (Signed)
Called and spoke to pt. Informed her of the results and recs per MR. Pt verbalized understanding and denied any further questions or concerns at this time.   

## 2015-08-27 ENCOUNTER — Other Ambulatory Visit: Payer: Self-pay | Admitting: Family Medicine

## 2015-08-27 NOTE — Telephone Encounter (Signed)
Pt has had a few recent acute appts but no recent f/u or CPE, please advise  

## 2015-08-27 NOTE — Telephone Encounter (Signed)
Please schedule amw and PE in the winter (lab prior) and refill until then

## 2015-08-28 ENCOUNTER — Telehealth: Payer: Self-pay | Admitting: Family Medicine

## 2015-08-28 NOTE — Telephone Encounter (Signed)
Will see her then 

## 2015-08-28 NOTE — Telephone Encounter (Signed)
[facility]  Patient Name: [name] [name]  DOB: 05-21-1934    Initial Comment Caller states c/o right arm numbness and tingling.   Nurse Assessment  Nurse: Wynetta Emery, RN, Baker Janus Date/Time Eilene Ghazi Time): 08/28/2015 9:38:27 AM  Confirm and document reason for call. If symptomatic, describe symptoms. You must click the next button to save text entered. ---[name] is having numbness in right hand tingling and numbness elbow to tip of fingers --onset over a month  Has the patient traveled out of the country within the last 30 days? ---No  Does the patient have any new or worsening symptoms? ---Yes  Will a triage be completed? ---Yes  Related visit to physician within the last 2 weeks? ---No  Does the PT have any chronic conditions? (i.e. diabetes, asthma, etc.) ---Unknown  Is this a behavioral health or substance abuse call? ---No     Guidelines    Guideline Title Affirmed Question Affirmed Notes  Neurologic Deficit [1] Tingling (e.g., pins and needles) of the face, arm / hand, or leg / foot on one side of the body AND [2] present now    Final Disposition User   See Physician within 4 Hours (or PCP triage) Wynetta Emery, RN, Baker Janus    Comments  NO AVAILABLE APPT WITH Loura Pardon, MD TODAY -- REFUSED TO SEE ANOTHER MD--MONDAY APPT GIVEN 08-31-2015 230PM DR. M. TOWER AT [facility]   Referrals  REFERRED TO PCP OFFICE   Disagree/Comply: Comply

## 2015-08-28 NOTE — Telephone Encounter (Signed)
[name] [name] will schedule appts and med refilled

## 2015-08-28 NOTE — Telephone Encounter (Signed)
Pt has appt 08/31/15 at 2:30 with Dr Glori Bickers.

## 2015-08-31 ENCOUNTER — Encounter: Payer: Self-pay | Admitting: Family Medicine

## 2015-08-31 ENCOUNTER — Ambulatory Visit (INDEPENDENT_AMBULATORY_CARE_PROVIDER_SITE_OTHER): Payer: Medicare Other | Admitting: Family Medicine

## 2015-08-31 ENCOUNTER — Telehealth: Payer: Self-pay | Admitting: Internal Medicine

## 2015-08-31 VITALS — BP 128/68 | HR 61 | Temp 97.7°F | Ht 64.5 in | Wt 185.5 lb

## 2015-08-31 DIAGNOSIS — G5601 Carpal tunnel syndrome, right upper limb: Secondary | ICD-10-CM

## 2015-08-31 DIAGNOSIS — G5600 Carpal tunnel syndrome, unspecified upper limb: Secondary | ICD-10-CM | POA: Insufficient documentation

## 2015-08-31 MED ORDER — METHOCARBAMOL 500 MG PO TABS: 500.0000 mg | ORAL_TABLET | Freq: Four times a day (QID) | ORAL | 0 refills | Status: DC | PRN

## 2015-08-31 NOTE — Progress Notes (Signed)
Subjective:    Patient ID: [name] [name], female    DOB: [birth year], 81 y.o.   MRN: [id number]  HPI Here for R hand and arm symptoms   R hand and arm go to "sleep" - off and on for a month or so  Worse when she lies down at night She notices that she puts more pressure on the R hand when leaning on her walker  Uses R hand for everything- and wrist stays sore  More of the tingling is in first 3 fingers   Also arthritis in fingers   She gets a crick in her neck occasionally   Hx of R MCA infarct in the past  She is on plavix Baseline weaker on L side   bp is stable today  No cp or palpitations or headaches or edema  No side effects to medicines  BP Readings from Last 3 Encounters:  08/31/15 128/68  08/05/15 126/72  06/23/15 116/84      Pt takes evista for OP- has been on it 5 y (over)- Dr Carloyn Manner started it Can stop it now ?  Patient Active Problem List   Diagnosis Date Noted  . Carpal tunnel syndrome 08/31/2015  . Dyspnea and respiratory abnormality 08/05/2015  . Bibasilar crackles 08/05/2015  . Cough 06/11/2015  . Right-sided headache 02/18/2015  . Right ear pain 02/18/2015  . Encounter for Medicare annual wellness exam 06/04/2014  . Obesity 06/04/2014  . Estrogen deficiency 06/04/2014  . Colon cancer screening 06/04/2014  . Dysphagia, pharyngoesophageal phase 03/27/2014  . Schatzki's ring 03/27/2014  . Patellar fracture 02/21/2014  . Patellar tendon rupture 02/21/2014  . Acute blood loss anemia 02/17/2014  . Hypokalemia 02/17/2014  . Pre-operative examination 12/27/2013  . Dysphagia 10/17/2011  . OA (osteoarthritis) of knee 08/16/2011  . Hemorrhoids 08/10/2011  . Other dyspnea and respiratory abnormality 06/21/2010  . Urinary incontinence 05/27/2010  . OVERACTIVE BLADDER 03/12/2010  . DEGENERATIVE DISC DISEASE 04/13/2009  . GERD 10/15/2008  . CEREBROVASCULAR ACCIDENT, HX OF 10/15/2008  . ARTHRITIS, GENERALIZED 06/26/2008  . EXTERNAL HEMORRHOIDS  02/01/2008  . HIATAL HERNIA 02/01/2008  . DIVERTICULOSIS OF COLON 02/01/2008  . GASTRITIS, HX OF 02/01/2008  . Vitamin D deficiency 07/03/2007  . Osteopenia 06/13/2007  . Fatigue 06/13/2007  . HELICOBACTER PYLORI INFECTION 06/06/2006  . Hyperlipidemia 06/06/2006  . Generalized anxiety disorder 06/06/2006  . Adjustment disorder with mixed anxiety and depressed mood 06/06/2006  . MACULAR DEGENERATION 06/06/2006  . CVA 06/06/2006  . OSTEOARTHRITIS 06/06/2006  . PALPITATIONS 05/25/2006   Past Medical History:  Diagnosis Date  . Alternating constipation and diarrhea   . Anxiety state, unspecified   . Complication of anesthesia    slow to wake up with surgery several years ago   . Depressive disorder, not elsewhere classified   . Diaphragmatic hernia without mention of obstruction or gangrene   . Difficulty sleeping   . Diverticulosis of colon (without mention of hemorrhage)   . Esophageal reflux   . External hemorrhoids without mention of complication   . Fatigue   . Frequency of urination   . Helicobacter pylori (H. pylori)   . Hiatal hernia   . History of transfusion   . Lumbar spondylosis   . Macular degeneration (senile) of retina, unspecified   . Osteoarthrosis, unspecified whether generalized or localized, unspecified site 08-11-11   hx. "rhematoid arthritis", osteoarthritis, DDD, bursitis(hip)  . Osteoporosis   . Palpitations   . PONV (postoperative nausea and vomiting)   . Schatzki's  ring   . Shortness of breath 08-11-11   with exertion only at present  . Unspecified cerebral artery occlusion with cerebral infarction 08-11-11   '97/ '06( TIA)-affected lt. side, slight weakness L side  . Unspecified vitamin D deficiency    Past Surgical History:  Procedure Laterality Date  . ABDOMINAL HYSTERECTOMY  08-11-11  . APPENDECTOMY    . BACK SURGERY  08-11-11   '11-hx. lumbar fusion with retained hardware  . CATARACT EXTRACTION  08-11-11   Bilateral  . ESOPHAGEAL DILATION      . KNEE SURGERY  08-11-11   rt. knee scope  . PATELLAR TENDON REPAIR Left 02/22/2014   Procedure: PATELLA TENDON REPAIR;  Surgeon: Gearlean Alf, MD;  Location: [facility];  Service: Orthopedics;  Laterality: Left;  . PATELLAR TENDON REPAIR Left 06/25/2014   Procedure: LEFT PATELLA TENDON REPAIR;  Surgeon: Gaynelle Arabian, MD;  Location[facility];  Service: Orthopedics;  Laterality: Left;  . TOTAL KNEE ARTHROPLASTY  08/16/2011   Procedure: TOTAL KNEE ARTHROPLASTY;  Surgeon: Gearlean Alf, MD;  Location: [facility];  Service: Orthopedics;  Laterality: Right;  . TOTAL KNEE ARTHROPLASTY Left 01/27/2014   Procedure: LEFT TOTAL KNEE ARTHROPLASTY;  Surgeon: Gearlean Alf, MD;  Location: [facility];  Service: Orthopedics;  Laterality: Left;  . TUBAL LIGATION     Social History  Substance Use Topics  . Smoking status: Never Smoker  . Smokeless tobacco: Never Used  . Alcohol use No   Family History  Problem Relation Age of Onset  . Heart disease Father   . Diabetes Sister   . Coronary artery disease Brother   . Diabetes Brother   . Heart disease Brother   . Pancreatic cancer Daughter   . Colon cancer Neg Hx   . Colon polyps Neg Hx   . Esophageal cancer Neg Hx   . Gallbladder disease Neg Hx    Allergies  Allergen Reactions  . Alendronate Sodium     REACTION: JAW PAIN  . Celecoxib     REACTION: GI UPSET  . Nexium [Esomeprazole Magnesium] Nausea And Vomiting    Pt cannot take the generic Does fine with DAW  . Latex Itching and Rash   Current Outpatient Prescriptions on File Prior to Visit  Medication Sig Dispense Refill  . acetaminophen (TYLENOL) 325 MG tablet Take 2 tablets (650 mg total) by mouth every 6 (six) hours as needed for mild pain (or Fever >/= 101). 40 tablet 0  . ALPRAZolam (XANAX) 0.5 MG tablet Take 1 tablet (0.5 mg total) by mouth at bedtime as needed for sleep. 90 tablet 0  . atorvastatin (LIPITOR) 10 MG tablet TAKE 1 TABLET DAILY 90 tablet 0  . calcium carbonate (TUMS - DOSED IN  MG ELEMENTAL CALCIUM) 500 MG chewable tablet Chew 1 tablet (200 mg of elemental calcium total) by mouth 4 (four) times daily as needed for indigestion or heartburn. 10 tablet 0  . clopidogrel (PLAVIX) 75 MG tablet Take 75 mg by mouth daily.    . fluticasone (FLONASE) 50 MCG/ACT nasal spray Place 1-2 sprays into both nostrils daily as needed for allergies or rhinitis.    Marland Kitchen HYDROcodone-acetaminophen (NORCO) 10-325 MG tablet Take 1 tablet by mouth every 6 (six) hours as needed. pain  0  . methocarbamol (ROBAXIN) 500 MG tablet Take 1 tablet (500 mg total) by mouth every 6 (six) hours as needed for muscle spasms. 80 tablet 0  . metoprolol succinate (TOPROL XL) 25 MG 24 hr tablet  Take 0.5 tablets (12.5 mg total) by mouth at bedtime. 90 tablet 3  . Multiple Vitamins-Minerals (PRESERVISION AREDS PO) Take 1 capsule by mouth 2 (two) times daily.    Marland Kitchen NEXIUM 40 MG capsule TAKE 1 CAPSULE TWICE A DAY 180 capsule 0  . oxyCODONE (OXY IR/ROXICODONE) 5 MG immediate release tablet Take 1-2 tablets (5-10 mg total) by mouth every 3 (three) hours as needed for moderate pain, severe pain or breakthrough pain. 80 tablet 0  . Polyethyl Glycol-Propyl Glycol (SYSTANE OP) Apply 1-2 drops to eye at bedtime as needed (dry eyes.).    Marland Kitchen raloxifene (EVISTA) 60 MG tablet TAKE 1 TABLET DAILY 90 tablet 0  . sertraline (ZOLOFT) 100 MG tablet TAKE 2 TABLETS DAILY 180 tablet 3   No current facility-administered medications on file prior to visit.     Review of Systems Review of Systems  Constitutional: Negative for fever, appetite change, fatigue and unexpected weight change.  Eyes: Negative for pain and visual disturbance.  Respiratory: Negative for cough and shortness of breath.   Cardiovascular: Negative for cp or palpitations    Gastrointestinal: Negative for nausea, diarrhea and constipation.  Genitourinary: Negative for urgency and frequency.  Skin: Negative for pallor or rash   MSK pos for R hand pain and tingling   Neurological: Negative for weakness, light-headedness, and headaches.  Hematological: Negative for adenopathy. Does not bruise/bleed easily.  Psychiatric/Behavioral: Negative for dysphoric mood. The patient is not nervous/anxious.         Objective:   Physical Exam  Constitutional: She appears well-developed and well-nourished.  obese and well appearing   HENT:  Head: Normocephalic and atraumatic.  Eyes: Conjunctivae and EOM are normal. Pupils are equal, round, and reactive to light. No scleral icterus.  Neck: Normal range of motion. Neck supple.  Cardiovascular: Normal rate and regular rhythm.   Musculoskeletal: She exhibits tenderness. She exhibits no edema or deformity.  R wrist-pain on full flexion Neg Finkelstien test  Pos tinel and phalen exams for reprod of pain and tingling in first 3 fingers  Lymphadenopathy:    She has no cervical adenopathy.  Neurological: She displays no atrophy. A sensory deficit is present. No cranial nerve deficit. She exhibits normal muscle tone. Gait normal.  Grip limited in R hand due to pain  Pos Tinel and phalen test R wrist  Dec sens soft touch R index finger   Skin: Skin is warm and dry. No rash noted. No erythema.  Psychiatric: She has a normal mood and affect.          Assessment & Plan:   Problem List Items Addressed This Visit      Nervous and Auditory   Carpal tunnel syndrome    R wrist with hand tingling/numbness and pain  Pos tinel and phalen tests  Given wrist splint today- and told to wear am and pm for 3 d and they just pm and as needed Want to avoid nsaids due to plav       Relevant Medications   methocarbamol (ROBAXIN) 500 MG tablet    Other Visit Diagnoses   None.

## 2015-08-31 NOTE — Assessment & Plan Note (Addendum)
R wrist with hand tingling/numbness and pain  Pos tinel and phalen tests  Given wrist splint today- and told to wear am and pm for 3 d and they just pm and as needed Want to avoid nsaids due to plavix use  Relative rest-will try to favor L hand when able Update if no imp in 2 wk

## 2015-08-31 NOTE — Telephone Encounter (Signed)
Pls let [name] [name] [facility] know that total time spent with a pulse ox while sleeping at night less than or equal to 88% was 2.7 minutes. This means she does not qualify or need nocturnal oxygen. I date of test 08/11/2015   Dr. Brand Males, M.D., [facility].C.P Pulmonary and Critical Care Medicine Staff Physician [place] [facility] Pager: [phone], If no answer or between  15:00h - 7:00h: call [phone]  08/31/2015 10:29 AM

## 2015-08-31 NOTE — Telephone Encounter (Signed)
Called and spoke to pt. Informed pt of the results per MR. Pt verbalized understanding and denied any further questions or concerns at this time.  

## 2015-08-31 NOTE — Progress Notes (Signed)
Pre visit review using our clinic review tool, if applicable. No additional management support is needed unless otherwise documented below in the visit note. 

## 2015-08-31 NOTE — Patient Instructions (Signed)
Stop the evista since you have been on it more than 5 years Wear your wrist splint night and day for 3 days  Then just at night and when it bothers you  If no further improvement let me know  I think you have carpal tunnel  Try to favor L hand as much as possible

## 2015-09-02 ENCOUNTER — Telehealth: Payer: Self-pay | Admitting: *Deleted

## 2015-09-02 ENCOUNTER — Encounter: Payer: Self-pay | Admitting: Internal Medicine

## 2015-09-02 ENCOUNTER — Ambulatory Visit (INDEPENDENT_AMBULATORY_CARE_PROVIDER_SITE_OTHER): Payer: Medicare Other | Admitting: Internal Medicine

## 2015-09-02 ENCOUNTER — Telehealth: Payer: Self-pay | Admitting: Internal Medicine

## 2015-09-02 VITALS — BP 110/68 | HR 65 | Ht 64.5 in | Wt 185.0 lb

## 2015-09-02 DIAGNOSIS — R0689 Other abnormalities of breathing: Secondary | ICD-10-CM | POA: Diagnosis not present

## 2015-09-02 DIAGNOSIS — R0600 Dyspnea, unspecified: Secondary | ICD-10-CM

## 2015-09-02 LAB — NITRIC OXIDE: Nitric Oxide: 18

## 2015-09-02 NOTE — Telephone Encounter (Signed)
  [name] [name]  I saw [name] [name] for dyspnea; main thing I am seeing is diastolic dysfunction. Anyting to help?  Thanks  Dr. Brand Males, M.D., [facility]C.P Pulmonary and Critical Care Medicine Staff Physician [place] [facility] Pager: [phone], If no answer or between  15:00h - 7:00h: call [phone]  09/02/2015 2:48 PM

## 2015-09-02 NOTE — Telephone Encounter (Signed)
Please ask pt if she has been on any of these before and I will pick one-prilosec, protonix (not sure what the 3rd one is)

## 2015-09-02 NOTE — Telephone Encounter (Signed)
Received fax saying that Nexium isn't covered under pt's insurance and we need either change Rx to an alt or fill out right side of form to start a PA, it says pt has had to try and fail all the alt meds on the list (omeprazole, pantoprazole, and rabeprazole), please either change Rx or fill out right side of form and sign it so I can start PA  Form is in your inbox

## 2015-09-02 NOTE — Addendum Note (Signed)
Addended by: Collier Salina on: 09/02/2015 05:29 PM   Modules accepted: Orders

## 2015-09-02 NOTE — Patient Instructions (Addendum)
ICD-9-CM ICD-10-CM   1. Dyspnea and respiratory abnormality 786.09 R06.00     R06.89     Shortness of breath is due to loss of height, physical deconditioning and diastolic dysfunction. No evidence of  Asthma on test today. No obvious pulmonary fibrosis but some post infectious scarring in one part of left lung that I do not think is causing all your shortness of breath  Diastolic dysfunction as a process where the heart does not relax well though the muscles  are strong and vigorous  Ideally  require home pulmonary  Rehabilitation but understand this would be very difficult given your spine issues  I have written to Dr. Burt Knack about how best we can address diastolic dysfunction  For now we will continue to monitor the situation if things get worse but can always repeat the CT scan of the chest  Followup 6 months or sooner if needed

## 2015-09-02 NOTE — Telephone Encounter (Signed)
Done and in IN box Please note med intolerances in her chart thanks

## 2015-09-02 NOTE — Progress Notes (Signed)
Subjective:     Patient ID: [name] [name], female   DOB: 05-Apr-1934, 81 y.o.   MRN: [id number]  HPI    PCP Loura Pardon, MD  HPI  OV 08/05/2015  Chief Complaint  Patient presents with  . Advice Only    Referred for cough by Dr. Glori Bickers.  Pt c/o worsening sob, cough X3-4 months.       81 year old female who is the wife of my patient Mr. Oletha Cruel was idiopathic pulmonary fibrosis. Patient's granddaughter tells he is also friends with Dr. Legrand Como wert's daughter. She's been referred for cough and dyspnea. Her husband is extremely concerned that she also has pulmonary fibrosis due to common exposures. Patient denies any family history of pulmonary fibrosis. Reports insidious onset of shortness of breath and cough simultaneously approximately 3 months ago. It is progressive. Moderate in intensity. Cough quality is dry. Both symptoms are present with exertion relieved by rest. Walking across the hallway in the room with her baseline walker use produces dyspnea [she uses walker because of knee and back issues for the last few years] there is no associated syncope or wheezing. She did have a cardiac stress is 06/24/2015 by Dr. Sherren Mocha I review this result myself and this is normal with an echocardiogram normal LV function. Last chest x-ray in our health system was 01/20/2014 with clear lung fields personally visualized the chest x-ray. Lab work shows hemoglobin of 12.1 g percent or greater 2017 and a creatinine of 1.06 mg percent June 2016.   Social history: Her husband likely renal stone has been admitted at [facility] long  Walking desaturation test 185 feet 3 labs on room air: Walked only 1 lap and stopped due to knee pain. She is a walker to walk. For this discussion she did not desaturate.  reports that she has never smoked. She has never used smokeless tobacco.  OV 09/02/2015  Chief Complaint  Patient presents with  . Follow-up    Pt here after HRCT, ONO, and PFT. Pt states  her breathing is unchanged since last OV. Pt c/o morning prod cough. Pt denies CP/tightness and f/c/s.     Here for shortness of breath follow-up. No new issues.She is frustrated. She has developed carpal tunnel syndrome because she's been using the walker for several years.  06/24/2015 2-D echocardiogram: Grade 2 diastolic dysfunction with normal left ventricular ejection systolic fraction. ISclerosis without stenosis  06/24/2015 new care medicine cardiac stress test: Low risk nuclear scan  08/07/2015: Pulmonary function test normal except for mild reduction in diffusion capacity to 18.87/70% isolated reduction in diffusion capacity    08/11/2015 oxygen overnight desaturation test: Terrial Rhodes know that total time spent with a pulse ox while sleeping at night less than or equal to 88% was 2.7 minutes. This means she does not qualify or need nocturnal oxygen. I date of test 08/11/2015  09/02/2015 = feno 18 and no assthma   08/18/2015: CT chest 08/18/2015 Musculoskeletal: Chronic appearing compression fracture of T11 with approximately 40% loss of anterior vertebral body height is noted. There are no aggressive appearing lytic or blastic lesions noted in the visualized portions of the skeleton.  IMPRESSION:CT chest 08/18/2015 1. No definite evidence of interstitial lung disease. 2. There is mild post infectious or inflammatory scarring in the inferior segment of the lingula, and there are some scattered areas of mild cylindrical bronchiectasis in the lung bases bilaterally. 3. Aortic atherosclerosis, in addition to left anterior descending coronary artery disease. Assessment for potential  risk factor modification, dietary therapy or pharmacologic therapy may be warranted, if clinically indicated. 4. There are calcifications of the aortic valve. Echocardiographic correlation for evaluation of potential valvular dysfunction may be warranted if clinically  indicated.   Electronically Signed   By: Vinnie Langton M.D.   On: 08/18/2015 15:08   Review of Systems     Objective:   Physical Exam  Vitals:   09/02/15 1429  BP: 110/68  Pulse: 65  SpO2: 97%  Weight: 185 lb (83.9 kg)  Height: 5' 4.5" (1.638 m)   Discussion only visit     Assessment:       ICD-9-CM ICD-10-CM   1. Dyspnea and respiratory abnormality 786.09 R06.00     R06.89        Plan:      Shortness of breath is due to loss of height, physical deconditioning and diastolic dysfunction. No evidence of  Asthma on test today. No obvious pulmonary fibrosis but some post infectious scarring in one part of left lung that I do not think is causing all your shortness of breath  Diastolic dysfunction as a process where the heart does not relax well though the muscles  are strong and vigorous  Ideally  require home pulmonary  Rehabilitation but understand this would be very difficult given your spine issues  I have written to Dr. Burt Knack about how best we can address diastolic dysfunction  For now we will continue to monitor the situation if things get worse but can always repeat the CT scan of the chest    > 50% of this > 25 min visit spent in face to face counseling or coordination of care   Dr. Brand Males, M.D., [facility].C.P Pulmonary and Critical Care Medicine Staff Physician [place] [facility] Pager: [phone], If no answer or between  15:00h - 7:00h: call [phone]  09/02/2015 2:55 PM

## 2015-09-02 NOTE — Telephone Encounter (Signed)
Pt said she has tried all 3, she tried the omeprazole OTC and it made her severely nauseous, she tried the protonix and Aciphex when she was in the hospital and they both made her vomit pretty badly, pt is requesting that you fill out the form the start the PA for the Nexium (right side of form)

## 2015-09-03 NOTE — Telephone Encounter (Signed)
PA faxed and allergy list updated

## 2015-09-03 NOTE — Telephone Encounter (Signed)
Can try her on a low dose diuretic. I think primary issue is deconditioning but will be happy to give a therapeutic trial of furosemide 20 mg daily. Lauren - can you call in Rx for her? thx

## 2015-09-04 MED ORDER — FUROSEMIDE 20 MG PO TABS: 20.0000 mg | ORAL_TABLET | Freq: Every day | ORAL | 3 refills | Status: DC

## 2015-09-04 NOTE — Telephone Encounter (Signed)
MR please advise if this message can be closed.  Thanks! 

## 2015-09-04 NOTE — Telephone Encounter (Signed)
Thanks Ronalee Belts. She says she cannot go to rehab due to kyphosis etc, and arthritis but I agree with you deconditiining primary.  No need to reply   Dr. Brand Males, M.D., [facility]C.P Pulmonary and Critical Care Medicine Staff Physician [place] [facility] Pager: [phone], If no answer or between  15:00h - 7:00h: call [phone]  09/04/2015 1:30 AM

## 2015-09-04 NOTE — Telephone Encounter (Signed)
PA approved and pt/pharmacy notified, letter placed in Dr. Marliss Coots inbox to sign and send for scanning

## 2015-09-04 NOTE — Telephone Encounter (Signed)
I spoke with the pt and made her aware of Dr Antionette Char recommendation of starting Furosemide 20mg  once a day.  Rx sent to pharmacy.

## 2015-09-10 ENCOUNTER — Other Ambulatory Visit: Payer: Self-pay | Admitting: *Deleted

## 2015-09-10 NOTE — Telephone Encounter (Signed)
Fax refill request, pt has had a few recent acute appts, last filled on 04/22/15 #90 with 0 refills, please advise

## 2015-09-10 NOTE — Telephone Encounter (Signed)
Please print and I will sign tomorrow for fax to her mail order pharmacy

## 2015-09-11 MED ORDER — ALPRAZOLAM 0.5 MG PO TABS: 0.5000 mg | ORAL_TABLET | Freq: Every evening | ORAL | Status: DC | PRN

## 2015-09-11 NOTE — Telephone Encounter (Signed)
Printed to fax  

## 2015-09-11 NOTE — Telephone Encounter (Signed)
My computer will not let me print Rxs out, Rx routed back to Dr. Glori Bickers to print and sign

## 2015-09-11 NOTE — Telephone Encounter (Signed)
Rx faxed to [facility].

## 2015-09-15 ENCOUNTER — Other Ambulatory Visit: Payer: Self-pay

## 2015-10-15 DIAGNOSIS — M47817 Spondylosis without myelopathy or radiculopathy, lumbosacral region: Secondary | ICD-10-CM | POA: Diagnosis not present

## 2015-10-15 DIAGNOSIS — M810 Age-related osteoporosis without current pathological fracture: Secondary | ICD-10-CM | POA: Diagnosis not present

## 2015-10-20 ENCOUNTER — Other Ambulatory Visit: Payer: Self-pay | Admitting: Neurosurgery

## 2015-10-20 DIAGNOSIS — M47817 Spondylosis without myelopathy or radiculopathy, lumbosacral region: Secondary | ICD-10-CM

## 2015-10-23 ENCOUNTER — Other Ambulatory Visit: Payer: Self-pay | Admitting: Neurosurgery

## 2015-10-23 ENCOUNTER — Ambulatory Visit
Admission: RE | Admit: 2015-10-23 | Discharge: 2015-10-23 | Disposition: A | Discharge status: Home / Self Care | Payer: Medicare Other | Source: Ambulatory Visit | Attending: Neurosurgery | Admitting: Neurosurgery

## 2015-10-23 DIAGNOSIS — M47816 Spondylosis without myelopathy or radiculopathy, lumbar region: Secondary | ICD-10-CM | POA: Diagnosis not present

## 2015-10-23 DIAGNOSIS — M47817 Spondylosis without myelopathy or radiculopathy, lumbosacral region: Secondary | ICD-10-CM

## 2015-10-23 DIAGNOSIS — M5126 Other intervertebral disc displacement, lumbar region: Secondary | ICD-10-CM | POA: Diagnosis not present

## 2015-11-14 ENCOUNTER — Other Ambulatory Visit: Payer: Self-pay | Admitting: Family Medicine

## 2015-11-16 NOTE — Telephone Encounter (Signed)
Received refill electronically Last office visit 08/31/15 See allergy/contrainidcation

## 2015-11-16 NOTE — Telephone Encounter (Signed)
Will refill electronically  

## 2015-11-26 ENCOUNTER — Other Ambulatory Visit: Payer: Self-pay | Admitting: Family Medicine

## 2015-11-26 NOTE — Telephone Encounter (Signed)
Please schedule AMW and annual exam with me in about 6 mo with labs  Refill until then thanks

## 2015-11-26 NOTE — Telephone Encounter (Signed)
Pt has had a few acute appts lately but no recent lipid labs, please advise

## 2015-12-01 NOTE — Telephone Encounter (Signed)
meds filled and info given to Price to schedule appts

## 2015-12-02 ENCOUNTER — Telehealth: Payer: Self-pay

## 2015-12-02 DIAGNOSIS — M25531 Pain in right wrist: Secondary | ICD-10-CM

## 2015-12-02 DIAGNOSIS — G5601 Carpal tunnel syndrome, right upper limb: Secondary | ICD-10-CM

## 2015-12-02 NOTE — Telephone Encounter (Signed)
Pt notified of Dr. Marliss Coots instructions and agrees with referral to hand specialist, pt saw Dr. Synthia Innocent in the past for her knees she isn't sure if that's something he would handle if not she is okay seeing someone else, please put referral in as urgent because pt is in severe pain

## 2015-12-02 NOTE — Telephone Encounter (Signed)
nsaids are not recommended with blood thinners unfortunately  It sounds like she needs to get set up with a hand specialist- is she ready for that ?  They may be able to help with an injection or other therapy

## 2015-12-02 NOTE — Telephone Encounter (Signed)
Pt was seen 08/31/15 and continues with pain in rt wrist; pt stopped plavix for 3 days and took ibuprofen and that helped the pain; when pt uses wrist a lot pain level goes to 10. Pt wants to know what can take for inflammation. Advised pt not to take Nsaids and pt voiced understanding.pt request [email or web address].

## 2015-12-02 NOTE — Telephone Encounter (Signed)
Referral done-will route to [facility] 

## 2015-12-03 NOTE — Telephone Encounter (Signed)
Opened in error

## 2015-12-04 DIAGNOSIS — M47812 Spondylosis without myelopathy or radiculopathy, cervical region: Secondary | ICD-10-CM | POA: Diagnosis not present

## 2015-12-04 DIAGNOSIS — M1811 Unilateral primary osteoarthritis of first carpometacarpal joint, right hand: Secondary | ICD-10-CM | POA: Diagnosis not present

## 2015-12-04 DIAGNOSIS — G5601 Carpal tunnel syndrome, right upper limb: Secondary | ICD-10-CM | POA: Diagnosis not present

## 2015-12-04 DIAGNOSIS — M25531 Pain in right wrist: Secondary | ICD-10-CM | POA: Diagnosis not present

## 2015-12-04 DIAGNOSIS — M19041 Primary osteoarthritis, right hand: Secondary | ICD-10-CM | POA: Diagnosis not present

## 2015-12-04 DIAGNOSIS — R52 Pain, unspecified: Secondary | ICD-10-CM | POA: Diagnosis not present

## 2015-12-14 ENCOUNTER — Ambulatory Visit (INDEPENDENT_AMBULATORY_CARE_PROVIDER_SITE_OTHER): Payer: Medicare Other

## 2015-12-14 VITALS — BP 116/70 | HR 60 | Temp 97.7°F | Ht 63.5 in | Wt 181.2 lb

## 2015-12-14 DIAGNOSIS — E559 Vitamin D deficiency, unspecified: Secondary | ICD-10-CM | POA: Diagnosis not present

## 2015-12-14 DIAGNOSIS — D649 Anemia, unspecified: Secondary | ICD-10-CM

## 2015-12-14 DIAGNOSIS — Z0000 Encounter for general adult medical examination without abnormal findings: Secondary | ICD-10-CM | POA: Diagnosis not present

## 2015-12-14 DIAGNOSIS — E784 Other hyperlipidemia: Secondary | ICD-10-CM | POA: Diagnosis not present

## 2015-12-14 DIAGNOSIS — D539 Nutritional anemia, unspecified: Secondary | ICD-10-CM

## 2015-12-14 DIAGNOSIS — R5382 Chronic fatigue, unspecified: Secondary | ICD-10-CM

## 2015-12-14 DIAGNOSIS — R7309 Other abnormal glucose: Secondary | ICD-10-CM | POA: Diagnosis not present

## 2015-12-14 DIAGNOSIS — E7849 Other hyperlipidemia: Secondary | ICD-10-CM

## 2015-12-14 LAB — LIPID PANEL
CHOLESTEROL: 180 mg/dL (ref 0–200)
HDL: 80.9 mg/dL (ref >39.00)
LDL Cholesterol: 85 mg/dL (ref 0–99)
NonHDL: 99.07
Total CHOL/HDL Ratio: 2
Triglycerides: 68 mg/dL (ref 0.0–149.0)
VLDL: 13.6 mg/dL (ref 0.0–40.0)

## 2015-12-14 LAB — COMPREHENSIVE METABOLIC PANEL
ALK PHOS: 67 U/L (ref 39–117)
ALT: 14 U/L (ref 0–35)
AST: 19 U/L (ref 0–37)
Albumin: 4.4 g/dL (ref 3.5–5.2)
BILIRUBIN TOTAL: 0.6 mg/dL (ref 0.2–1.2)
BUN: 15 mg/dL (ref 6–23)
CO2: 29 mEq/L (ref 19–32)
Calcium: 9.6 mg/dL (ref 8.4–10.5)
Chloride: 105 mEq/L (ref 96–112)
Creatinine, Ser: 0.88 mg/dL (ref 0.40–1.20)
GFR: 65.9 mL/min (ref >60.00)
GLUCOSE: 98 mg/dL (ref 70–99)
POTASSIUM: 4.2 meq/L (ref 3.5–5.1)
SODIUM: 141 meq/L (ref 135–145)
TOTAL PROTEIN: 7.4 g/dL (ref 6.0–8.3)

## 2015-12-14 LAB — CBC
HCT: 38.8 % (ref 36.0–46.0)
HEMOGLOBIN: 12.7 g/dL (ref 12.0–15.0)
MCHC: 32.7 g/dL (ref 30.0–36.0)
MCV: 81.2 fl (ref 78.0–100.0)
Platelets: 279 10*3/uL (ref 150.0–400.0)
RBC: 4.77 Mil/uL (ref 3.87–5.11)
RDW: 16.2 % — AB (ref 11.5–15.5)
WBC: 4.5 10*3/uL (ref 4.0–10.5)

## 2015-12-14 LAB — VITAMIN D 25 HYDROXY (VIT D DEFICIENCY, FRACTURES): VITD: 56.34 ng/mL (ref 30.00–100.00)

## 2015-12-14 LAB — TSH: TSH: 1.33 u[IU]/mL (ref 0.35–4.50)

## 2015-12-14 LAB — HEMOGLOBIN A1C: HEMOGLOBIN A1C: 6 % (ref 4.6–6.5)

## 2015-12-14 LAB — VITAMIN B12: VITAMIN B 12: 680 pg/mL (ref 211–911)

## 2015-12-14 NOTE — Progress Notes (Signed)
Pre visit review using our clinic review tool, if applicable. No additional management support is needed unless otherwise documented below in the visit note. 

## 2015-12-14 NOTE — Progress Notes (Signed)
Subjective:   [name] [name] is a 81 y.o. female who presents for Medicare Annual (Subsequent) preventive examination.  Review of Systems:  N/A Cardiac Risk Factors include: advanced age (>45men, >39 women);obesity (BMI >30kg/m2);dyslipidemia     Objective:     Vitals: BP 116/70 (BP Location: Left Arm, Patient Position: Sitting, Cuff Size: Normal)   Pulse 60   Temp 97.7 F (36.5 C) (Oral)   Ht 5' 3.5" (1.613 m) Comment: no shoes  Wt 181 lb 4 oz (82.2 kg)   SpO2 97%   BMI 31.60 kg/m   Body mass index is 31.6 kg/m.   Tobacco History  Smoking Status  . Never Smoker  Smokeless Tobacco  . Never Used     Counseling given: No   Past Medical History:  Diagnosis Date  . Alternating constipation and diarrhea   . Anxiety state, unspecified   . Complication of anesthesia    slow to wake up with surgery several years ago   . Depressive disorder, not elsewhere classified   . Diaphragmatic hernia without mention of obstruction or gangrene   . Difficulty sleeping   . Diverticulosis of colon (without mention of hemorrhage)   . Esophageal reflux   . External hemorrhoids without mention of complication   . Fatigue   . Frequency of urination   . Helicobacter pylori (H. pylori)   . Hiatal hernia   . History of transfusion   . Lumbar spondylosis   . Macular degeneration (senile) of retina, unspecified   . Osteoarthrosis, unspecified whether generalized or localized, unspecified site 08-11-11   hx. "rhematoid arthritis", osteoarthritis, DDD, bursitis(hip)  . Osteoporosis   . Palpitations   . PONV (postoperative nausea and vomiting)   . Schatzki's ring   . Shortness of breath 08-11-11   with exertion only at present  . Unspecified cerebral artery occlusion with cerebral infarction 08-11-11   '97/ '06( TIA)-affected lt. side, slight weakness L side  . Unspecified vitamin D deficiency    Past Surgical History:  Procedure Laterality Date  . ABDOMINAL HYSTERECTOMY  08-11-11    . APPENDECTOMY    . BACK SURGERY  08-11-11   '11-hx. lumbar fusion with retained hardware  . CATARACT EXTRACTION  08-11-11   Bilateral  . ESOPHAGEAL DILATION    . KNEE SURGERY  08-11-11   rt. knee scope  . PATELLAR TENDON REPAIR Left 02/22/2014   Procedure: PATELLA TENDON REPAIR;  Surgeon: Gearlean Alf, MD;  Location: [facility];  Service: Orthopedics;  Laterality: Left;  . PATELLAR TENDON REPAIR Left 06/25/2014   Procedure: LEFT PATELLA TENDON REPAIR;  Surgeon: Gaynelle Arabian, MD;  Location: [facility];  Service: Orthopedics;  Laterality: Left;  . TOTAL KNEE ARTHROPLASTY  08/16/2011   Procedure: TOTAL KNEE ARTHROPLASTY;  Surgeon: Gearlean Alf, MD;  Location: [facility];  Service: Orthopedics;  Laterality: Right;  . TOTAL KNEE ARTHROPLASTY Left 01/27/2014   Procedure: LEFT TOTAL KNEE ARTHROPLASTY;  Surgeon: Gearlean Alf, MD;  Location: [facility];  Service: Orthopedics;  Laterality: Left;  . TUBAL LIGATION     Family History  Problem Relation Age of Onset  . Heart disease Father   . Coronary artery disease Brother   . Diabetes Brother   . Heart disease Brother   . Pancreatic cancer Daughter   . Diabetes Sister   . Colon cancer Neg Hx   . Colon polyps Neg Hx   . Esophageal cancer Neg Hx   . Gallbladder disease Neg Hx  History  Sexual Activity  . Sexual activity: No    Outpatient Encounter Prescriptions as of 12/14/2015  Medication Sig  . acetaminophen (TYLENOL) 325 MG tablet Take 2 tablets (650 mg total) by mouth every 6 (six) hours as needed for mild pain (or Fever >/= 101).  Marland Kitchen ALPRAZolam (XANAX) 0.5 MG tablet Take 1 tablet (0.5 mg total) by mouth at bedtime as needed for sleep.  Marland Kitchen atorvastatin (LIPITOR) 10 MG tablet TAKE 1 TABLET DAILY  . calcium carbonate (TUMS - DOSED IN MG ELEMENTAL CALCIUM) 500 MG chewable tablet Chew 1 tablet (200 mg of elemental calcium total) by mouth 4 (four) times daily as needed for indigestion or heartburn.  . clopidogrel (PLAVIX) 75 MG tablet Take 75 mg by  mouth daily.  . fluticasone (FLONASE) 50 MCG/ACT nasal spray Place 1-2 sprays into both nostrils daily as needed for allergies or rhinitis.  Marland Kitchen HYDROcodone-acetaminophen (NORCO) 10-325 MG tablet Take 1 tablet by mouth every 6 (six) hours as needed. pain  . methocarbamol (ROBAXIN) 500 MG tablet Take 1 tablet (500 mg total) by mouth every 6 (six) hours as needed for muscle spasms.  . metoprolol succinate (TOPROL XL) 25 MG 24 hr tablet Take 0.5 tablets (12.5 mg total) by mouth at bedtime.  . Multiple Vitamins-Minerals (PRESERVISION AREDS PO) Take 1 capsule by mouth 2 (two) times daily.  Marland Kitchen NEXIUM 40 MG capsule TAKE 1 CAPSULE TWICE A DAY  . oxyCODONE (OXY IR/ROXICODONE) 5 MG immediate release tablet Take 1-2 tablets (5-10 mg total) by mouth every 3 (three) hours as needed for moderate pain, severe pain or breakthrough pain.  Vladimir Faster Glycol-Propyl Glycol (SYSTANE OP) Apply 1-2 drops to eye at bedtime as needed (dry eyes.).  Marland Kitchen raloxifene (EVISTA) 60 MG tablet TAKE 1 TABLET DAILY  . sertraline (ZOLOFT) 100 MG tablet TAKE 2 TABLETS DAILY  . furosemide (LASIX) 20 MG tablet Take 1 tablet (20 mg total) by mouth daily.   No facility-administered encounter medications on file as of 12/14/2015.     Activities of Daily Living In your present state of health, do you have any difficulty performing the following activities: 12/14/2015  Hearing? Y  Vision? N  Difficulty concentrating or making decisions? N  Walking or climbing stairs? Y  Dressing or bathing? N  Doing errands, shopping? Y  Preparing Food and eating ? Y  Using the Toilet? N  In the past six months, have you accidently leaked urine? Y  Do you have problems with loss of bowel control? Y  Managing your Medications? N  Managing your Finances? N  Housekeeping or managing your Housekeeping? N  Some recent data might be hidden    Patient Care Team: Abner Greenspan, MD as PCP - General Sherren Mocha, MD as Consulting Physician  (Cardiology) Calvert Cantor, MD as Consulting Physician (Ophthalmology) Glenna Fellows, MD as Attending Physician (Neurosurgery) Daryll Brod, MD as Consulting Physician (Orthopedic Surgery) Gaynelle Arabian, MD as Consulting Physician (Orthopedic Surgery) Brand Males, MD as Consulting Physician (Pulmonary Disease)    Assessment:     Hearing Screening   125Hz  250Hz  500Hz  1000Hz  2000Hz  3000Hz  4000Hz  6000Hz  8000Hz   Right ear:   0 0 40  0    Left ear:   0 0 40  40    Vision Screening Comments: Last vision exam with Jan 2017 with Dr. Bing Plume   Exercise Activities and Dietary recommendations Current Exercise Habits: Home exercise routine, Type of exercise: strength training/weights;stretching, Time (Minutes): 20, Frequency (Times/Week): 7, Weekly Exercise (Minutes/Week):  140, Intensity: Mild, Exercise limited by: orthopedic condition(s)  Goals    . Increase physical activity          Starting 12/14/2015, I will continue to exercise at least 10-15 min 2 days per week.       Fall Risk Fall Risk  12/14/2015 06/04/2014 09/19/2012  Falls in the past year? No Yes No  Number falls in past yr: - 1 -  Injury with Fall? - Yes -  Risk for fall due to : - Impaired mobility;Impaired balance/gait -   Depression Screen PHQ 2/9 Scores 12/14/2015 06/04/2014 09/19/2012  PHQ - 2 Score 3 0 0  PHQ- 9 Score 12 - -     Cognitive Function MMSE - Mini Mental State Exam 12/14/2015  Orientation to time 5  Orientation to Place 5  Registration 3  Attention/ Calculation 0  Recall 3  Language- name 2 objects 0  Language- repeat 1  Language- follow 3 step command 3  Language- read & follow direction 0  Write a sentence 0  Copy design 0  Total score 20     PLEASE NOTE: A Mini-Cog screen was completed. Maximum score is 20. A value of 0 denotes this part of Folstein MMSE was not completed or the patient failed this part of the Mini-Cog screening.   Mini-Cog Screening Orientation to Time - Max 5 pts Orientation to  Place - Max 5 pts Registration - Max 3 pts Recall - Max 3 pts Language Repeat - Max 1 pts Language Follow 3 Step Command - Max 3 pts'    Immunization History  Administered Date(s) Administered  . Influenza Split 10/17/2011  . Influenza Whole 10/24/2003, 10/19/2006, 10/15/2007, 11/15/2008, 10/17/2009  . Influenza,inj,Quad PF,36+ Mos 09/19/2012, 11/26/2013  . Influenza-Unspecified 11/10/2014  . Pneumococcal Conjugate-13 05/07/2013  . Pneumococcal Polysaccharide-23 01/03/2002, 06/13/2007  . Td 10/09/1996, 06/13/2007  . Zoster 03/21/2013   Screening Tests Health Maintenance  Topic Date Due  . MAMMOGRAM  03/15/2016  . TETANUS/TDAP  06/12/2017  . INFLUENZA VACCINE  Addressed  . DEXA SCAN  Completed  . ZOSTAVAX  Completed  . PNA vac Low Risk Adult  Completed      Plan:     I have personally reviewed and addressed the Medicare Annual Wellness questionnaire and have noted the following in the patient's chart:  A. Medical and social history B. Use of alcohol, tobacco or illicit drugs  C. Current medications and supplements D. Functional ability and status E.  Nutritional status F.  Physical activity G. Advance directives H. List of other physicians I.  Hospitalizations, surgeries, and ER visits in previous 12 months J.  [place] to include hearing, vision, cognitive, depression L. Referrals and appointments - none  In addition, I have reviewed and discussed with patient certain preventive protocols, quality metrics, and best practice recommendations. A written personalized care plan for preventive services as well as general preventive health recommendations were provided to patient.  See attached scanned questionnaire for additional information.   Signed,   Lindell Noe, MHA, BS, LPN Health Coach

## 2015-12-14 NOTE — Progress Notes (Signed)
PCP notes:   Health maintenance:  Flu vaccine - per pt report, vaccine taken 09/2015  Abnormal screenings:   Hearing - failed Depression score: 12  Patient concerns:   Pt wants to discuss taking B12 injections for chronic fatigue. Additionally, pt verbalized feeling overwhelmed about commitments and responsibilities with care of sick spouse. Pt states arthritic pain has become increasingly worse and she needs to be prescribed something for it. Pt reports pending nerve study on right arm on 12/15/15.   Nurse concerns:  None  Next PCP appt:   01/19/16 @ 1115 (Note: pt desired to have appt scheduled in Jan instead of Dec)  I reviewed health advisor's note, was available for consultation, and agree with documentation and plan. Loura Pardon MD

## 2015-12-14 NOTE — Patient Instructions (Signed)
[name] [name] , Thank you for taking time to come for your Medicare Wellness Visit. I appreciate your ongoing commitment to your health goals. Please review the following plan we discussed and let me know if I can assist you in the future.   These are the goals we discussed: Goals    . Increase physical activity          Starting 12/14/2015, I will continue to exercise at least 10-15 min 2 days per week.        This is a list of the screening recommended for you and due dates:  Health Maintenance  Topic Date Due  . Mammogram  03/15/2016  . Tetanus Vaccine  06/12/2017  . Flu Shot  Addressed  . DEXA scan (bone density measurement)  Completed  . Shingles Vaccine  Completed  . Pneumonia vaccines  Completed   Preventive Care for Adults  A healthy lifestyle and preventive care can promote health and wellness. Preventive health guidelines for adults include the following key practices.  . A routine yearly physical is a good way to check with your health care provider about your health and preventive screening. It is a chance to share any concerns and updates on your health and to receive a thorough exam.  . Visit your dentist for a routine exam and preventive care every 6 months. Brush your teeth twice a day and floss once a day. Good oral hygiene prevents tooth decay and gum disease.  . The frequency of eye exams is based on your age, health, family medical history, use  of contact lenses, and other factors. Follow your health care provider's ecommendations for frequency of eye exams.  . Eat a healthy diet. Foods like vegetables, fruits, whole grains, low-fat dairy products, and lean protein foods contain the nutrients you need without too many calories. Decrease your intake of foods high in solid fats, added sugars, and salt. Eat the right amount of calories for you. Get information about a proper diet from your health care provider, if necessary.  . Regular physical exercise is one of  the most important things you can do for your health. Most adults should get at least 150 minutes of moderate-intensity exercise (any activity that increases your heart rate and causes you to sweat) each week. In addition, most adults need muscle-strengthening exercises on 2 or more days a week.  Silver Sneakers may be a benefit available to you. To determine eligibility, you may visit the website: www.silversneakers.com or contact program at [phone] Mon-Fri between 8AM-8PM.   . Maintain a healthy weight. The body mass index (BMI) is a screening tool to identify possible weight problems. It provides an estimate of body fat based on height and weight. Your health care provider can find your BMI and can help you achieve or maintain a healthy weight.   For adults 20 years and older: ? A BMI below 18.5 is considered underweight. ? A BMI of 18.5 to 24.9 is normal. ? A BMI of 25 to 29.9 is considered overweight. ? A BMI of 30 and above is considered obese.   . Maintain normal blood lipids and cholesterol levels by exercising and minimizing your intake of saturated fat. Eat a balanced diet with plenty of fruit and vegetables. Blood tests for lipids and cholesterol should begin at age 60 and be repeated every 5 years. If your lipid or cholesterol levels are high, you are over 50, or you are at high risk for heart disease, you may  need your cholesterol levels checked more frequently. Ongoing high lipid and cholesterol levels should be treated with medicines if diet and exercise are not working.  . If you smoke, find out from your health care provider how to quit. If you do not use tobacco, please do not start.  . If you choose to drink alcohol, please do not consume more than 2 drinks per day. One drink is considered to be 12 ounces (355 mL) of beer, 5 ounces (148 mL) of wine, or 1.5 ounces (44 mL) of liquor.  . If you are 34-68 years old, ask your health care provider if you should take aspirin to  prevent strokes.  . Use sunscreen. Apply sunscreen liberally and repeatedly throughout the day. You should seek shade when your shadow is shorter than you. Protect yourself by wearing long sleeves, pants, a wide-brimmed hat, and sunglasses year round, whenever you are outdoors.  . Once a month, do a whole body skin exam, using a mirror to look at the skin on your back. Tell your health care provider of new moles, moles that have irregular borders, moles that are larger than a pencil eraser, or moles that have changed in shape or color.

## 2015-12-15 ENCOUNTER — Ambulatory Visit: Payer: Medicare Other

## 2015-12-15 DIAGNOSIS — G5601 Carpal tunnel syndrome, right upper limb: Secondary | ICD-10-CM | POA: Diagnosis not present

## 2015-12-16 DIAGNOSIS — G5601 Carpal tunnel syndrome, right upper limb: Secondary | ICD-10-CM | POA: Diagnosis not present

## 2015-12-18 DIAGNOSIS — M47817 Spondylosis without myelopathy or radiculopathy, lumbosacral region: Secondary | ICD-10-CM | POA: Diagnosis not present

## 2015-12-18 DIAGNOSIS — M810 Age-related osteoporosis without current pathological fracture: Secondary | ICD-10-CM | POA: Diagnosis not present

## 2016-01-08 IMAGING — CR DG ELBOW COMPLETE 3+V*R*
4 series · 4 of 4 positions shown · non-contrast
Comparison: None.

CLINICAL DATA: Pain post fall, right elbow pain

EXAM:
RIGHT ELBOW - COMPLETE 3+ VIEW

[x elbow ap right]
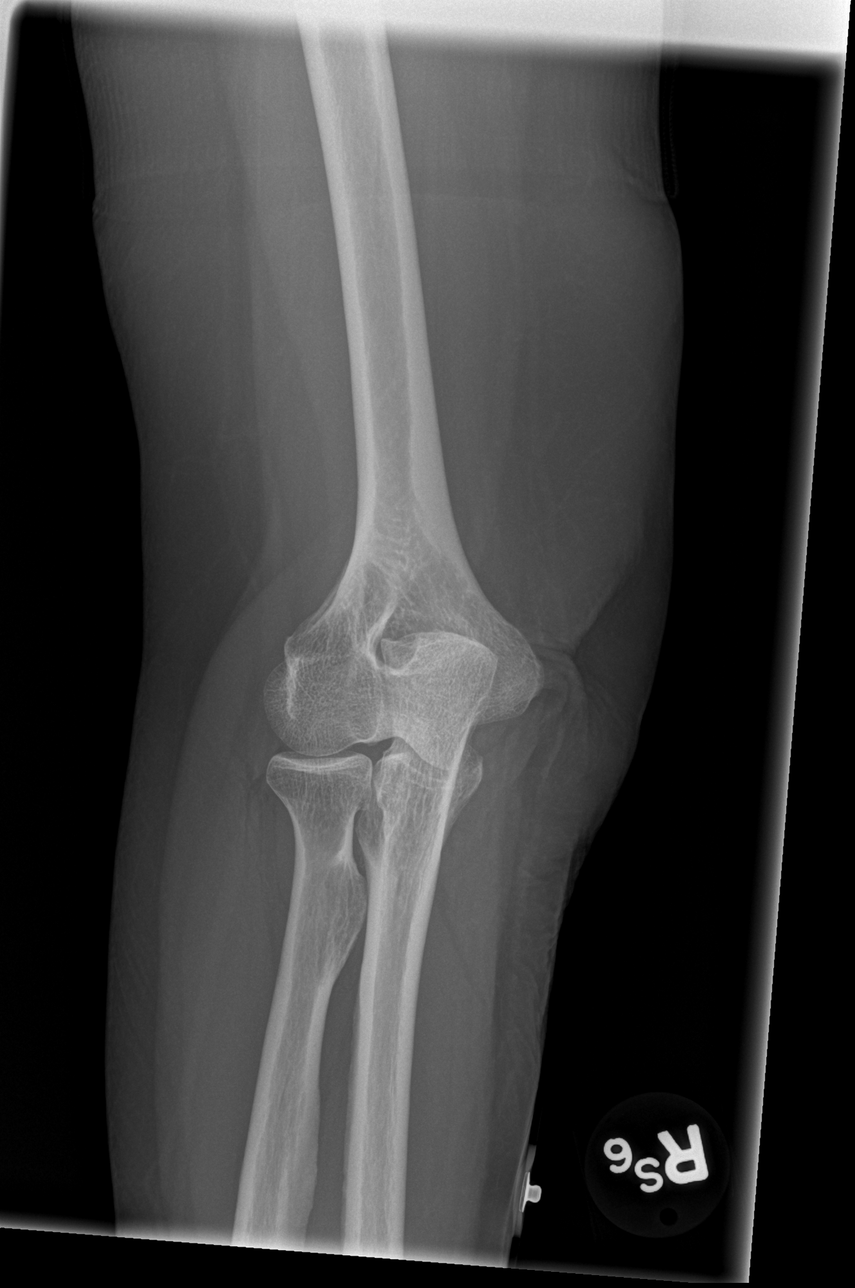

[x elbow obl right (1 of 2)]
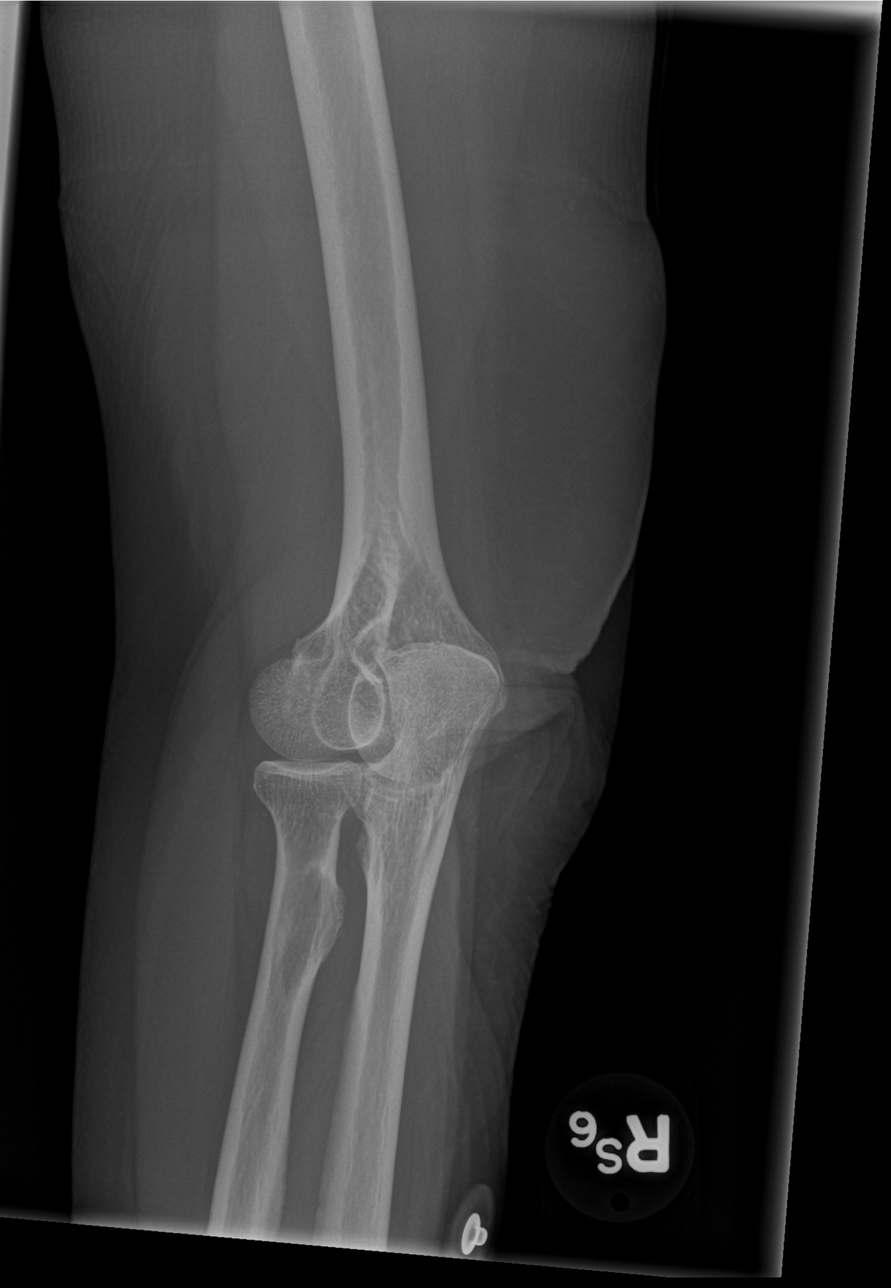

[x elbow obl right (2 of 2)]
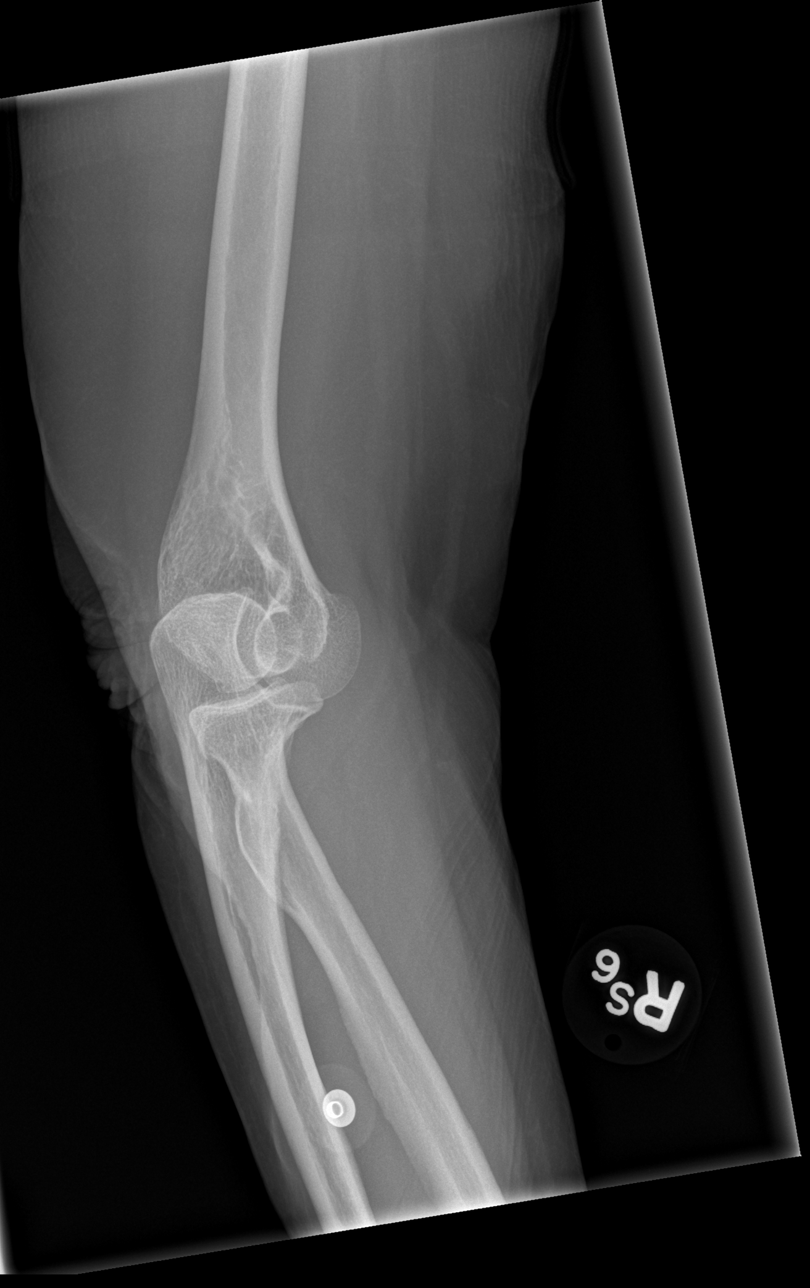

[x elbow lat right]
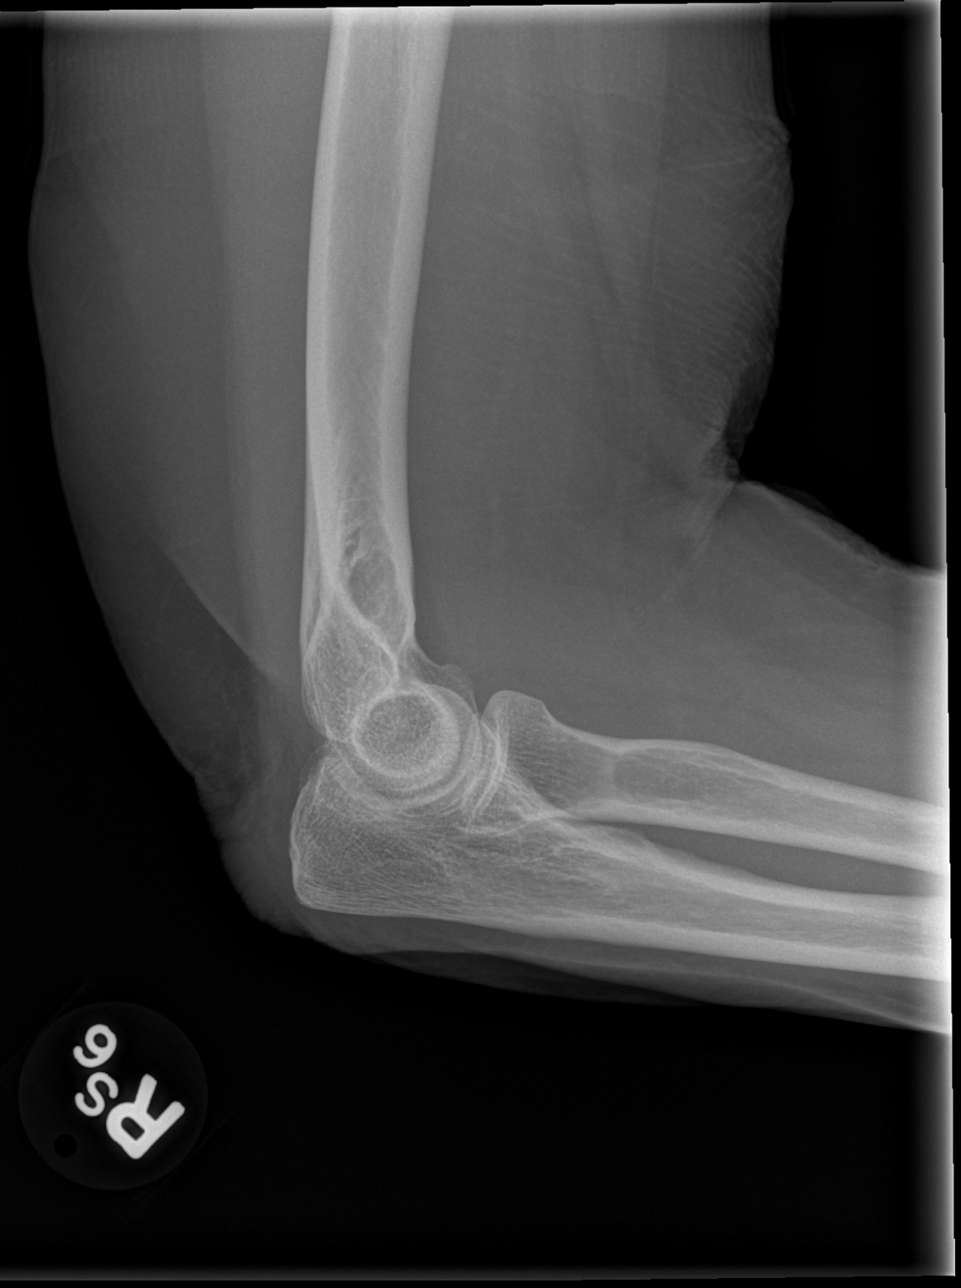

[4 of 4 positions shown; findings below may reference images not displayed]

FINDINGS: Four views of right elbow submitted. No acute fracture or
subluxation. No radiopaque foreign body. No posterior fat pad sign.
IMPRESSION: Negative.

## 2016-01-09 IMAGING — CR DG LUMBAR SPINE 2-3V
3 series · 3 of 3 positions shown · non-contrast
Comparison: 07/14/2012.

CLINICAL DATA: Lumbar pain without radiation.

EXAM:
LUMBAR SPINE - 2-3 VIEW

[w l-spine lat * (1 of 2)]
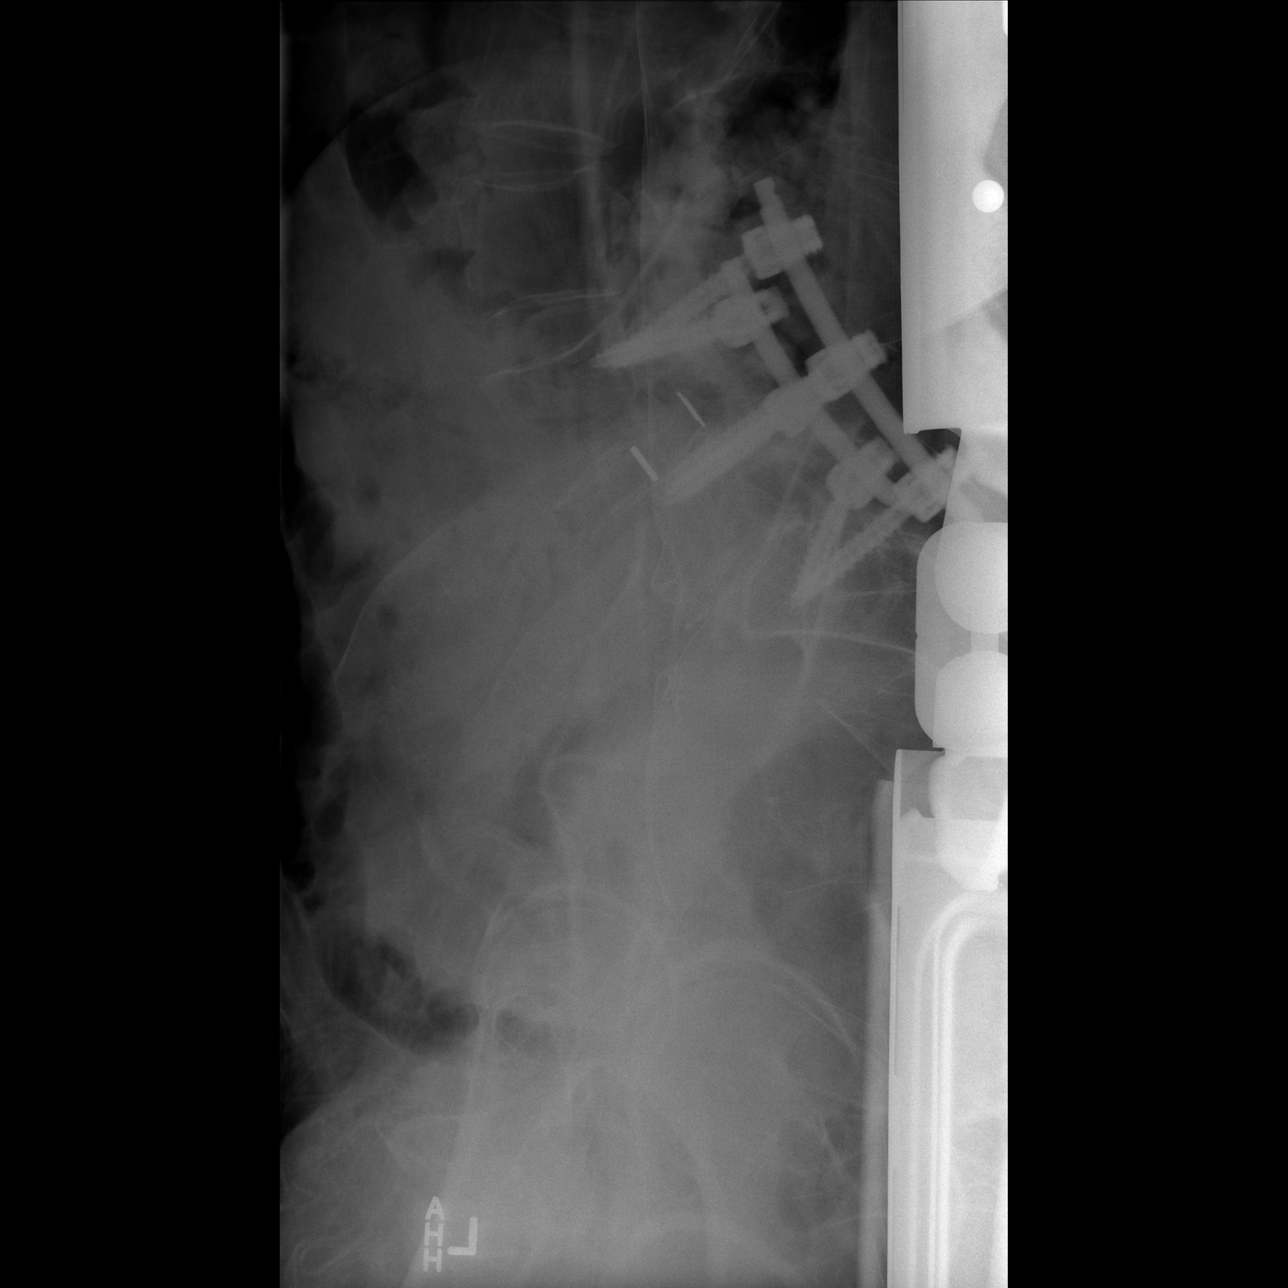

[w l-spine lat * (2 of 2)]
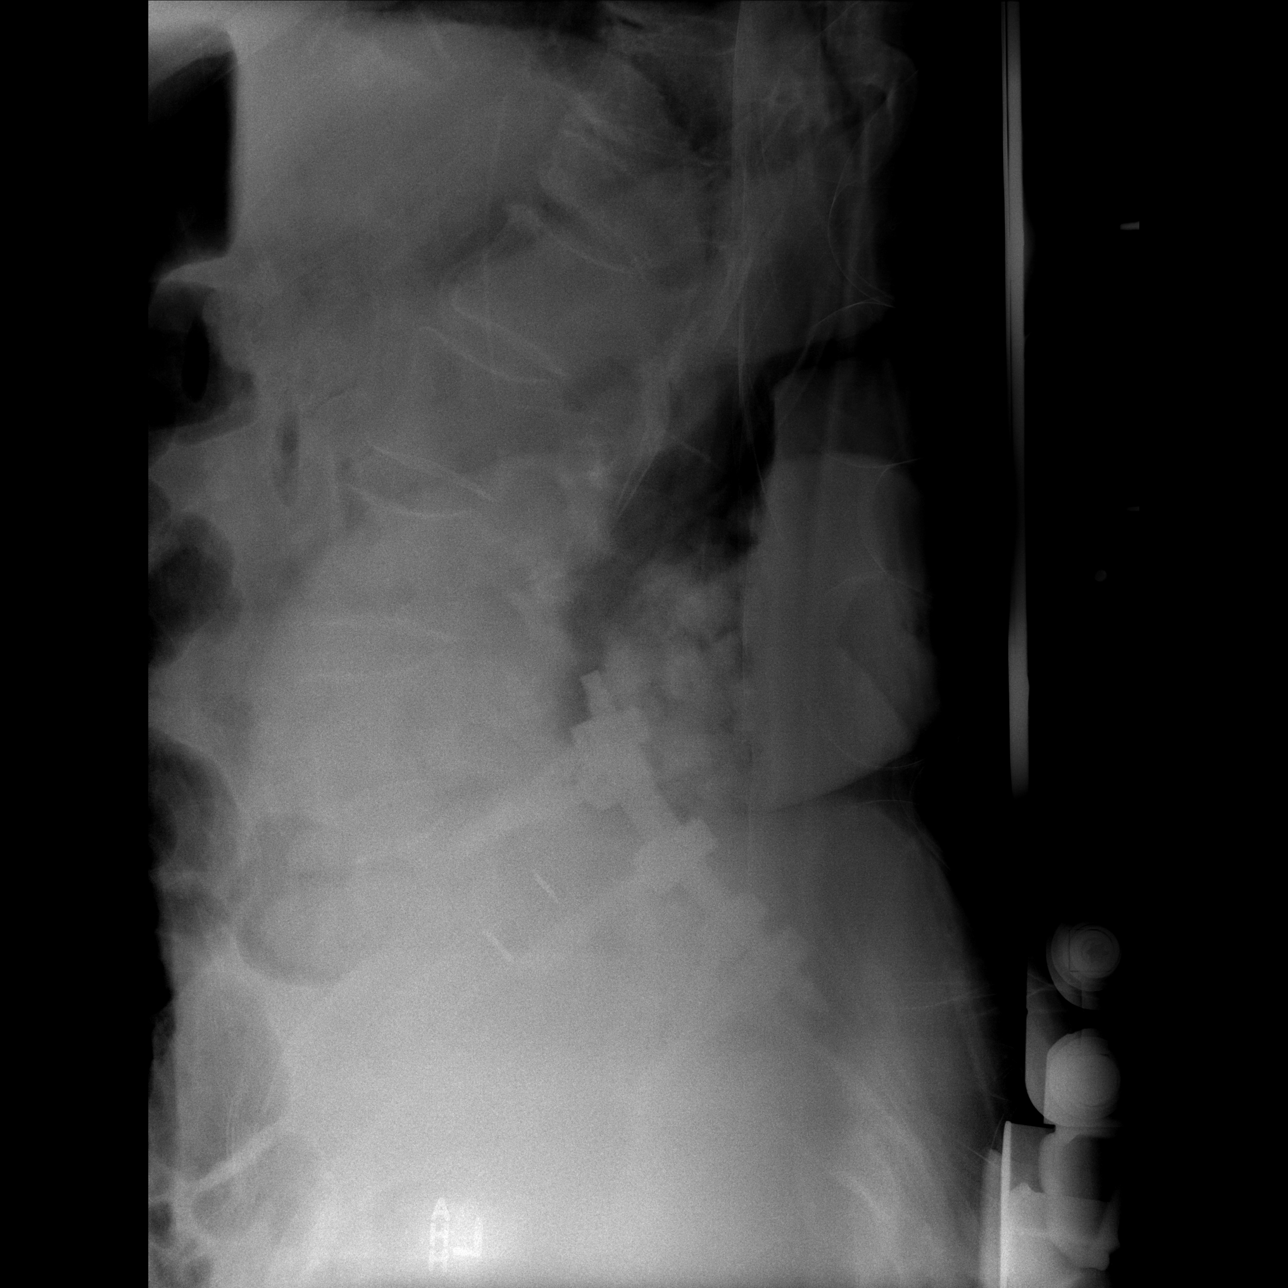

[view not recorded]
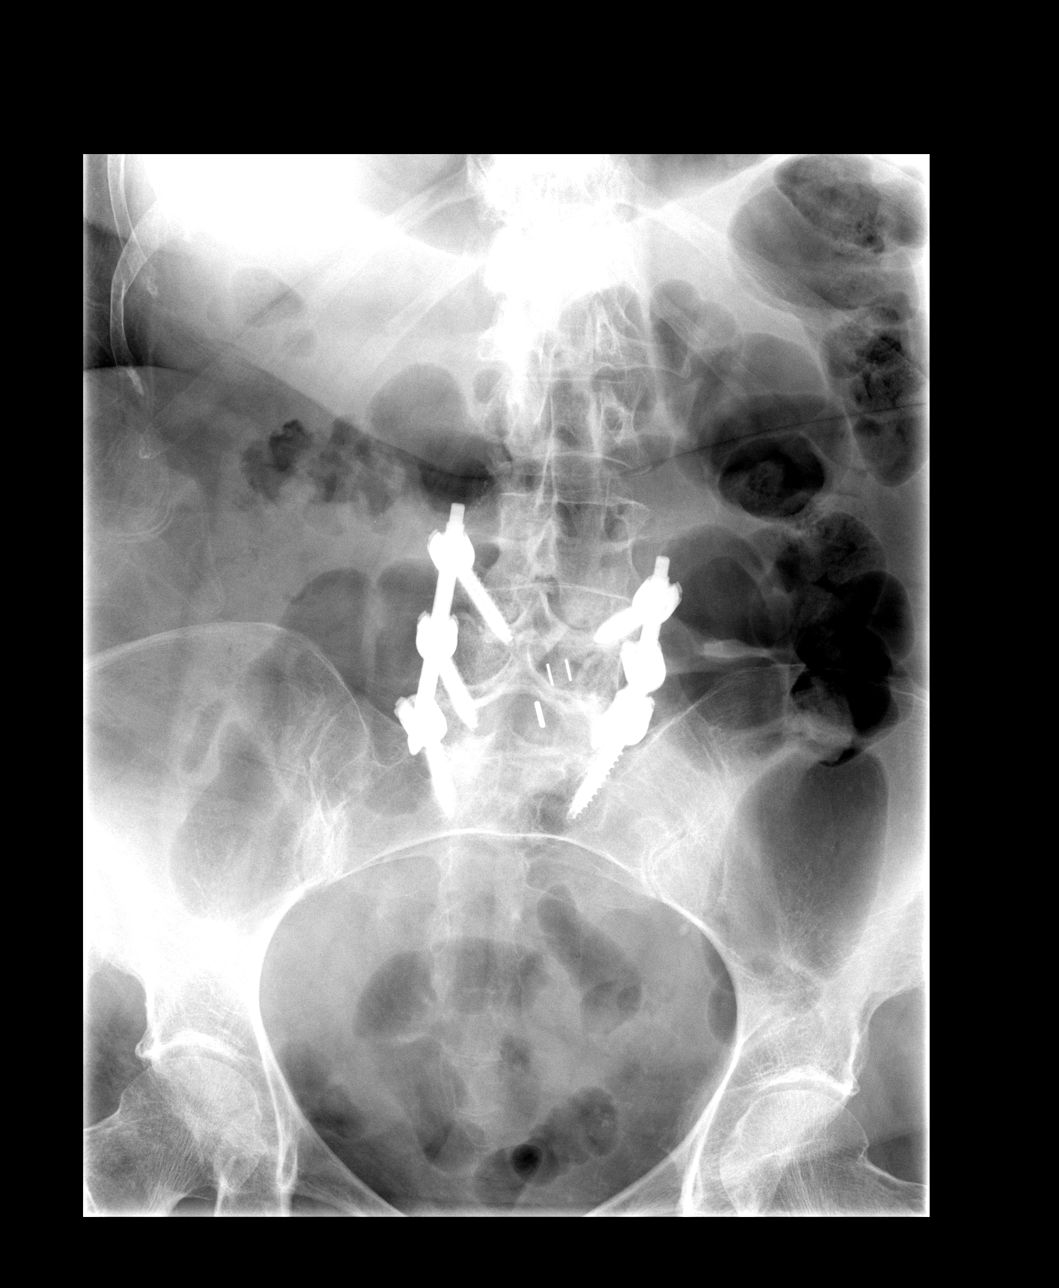

[3 of 3 positions shown; findings below may reference images not displayed]

FINDINGS: Mild levoconvex curvature is seen in the lower lumbar spine. Image
quality is degraded by technique. Patient is status post L4-S1
posterior lumbar interbody fusion with a grossly stable appearance.
Mild anterior wedging of T11, stable. Vertebral body height is
otherwise maintained. Alignment is anatomic. Endplate degenerative
changes and loss of disc space height in the lower thoracic spine.
IMPRESSION: 1. Image quality is degraded by technique. No definite acute
findings.
2. L4-S1 posterior lumbar interbody fusion, grossly stable.
3. Degenerative disc disease in the lower thoracic spine.
4. T11 compression fracture, stable.

## 2016-01-19 ENCOUNTER — Encounter: Payer: Self-pay | Admitting: Family Medicine

## 2016-01-19 ENCOUNTER — Ambulatory Visit (INDEPENDENT_AMBULATORY_CARE_PROVIDER_SITE_OTHER): Payer: Medicare Other | Admitting: Family Medicine

## 2016-01-19 VITALS — BP 122/74 | HR 68 | Temp 97.8°F | Ht 63.5 in | Wt 187.8 lb

## 2016-01-19 DIAGNOSIS — R5382 Chronic fatigue, unspecified: Secondary | ICD-10-CM | POA: Diagnosis not present

## 2016-01-19 DIAGNOSIS — E6609 Other obesity due to excess calories: Secondary | ICD-10-CM | POA: Diagnosis not present

## 2016-01-19 DIAGNOSIS — H9193 Unspecified hearing loss, bilateral: Secondary | ICD-10-CM | POA: Diagnosis not present

## 2016-01-19 DIAGNOSIS — F4323 Adjustment disorder with mixed anxiety and depressed mood: Secondary | ICD-10-CM | POA: Diagnosis not present

## 2016-01-19 DIAGNOSIS — H9190 Unspecified hearing loss, unspecified ear: Secondary | ICD-10-CM | POA: Insufficient documentation

## 2016-01-19 DIAGNOSIS — E7800 Pure hypercholesterolemia, unspecified: Secondary | ICD-10-CM

## 2016-01-19 DIAGNOSIS — Z1211 Encounter for screening for malignant neoplasm of colon: Secondary | ICD-10-CM

## 2016-01-19 DIAGNOSIS — M810 Age-related osteoporosis without current pathological fracture: Secondary | ICD-10-CM

## 2016-01-19 DIAGNOSIS — E559 Vitamin D deficiency, unspecified: Secondary | ICD-10-CM

## 2016-01-19 DIAGNOSIS — Z6832 Body mass index (BMI) 32.0-32.9, adult: Secondary | ICD-10-CM | POA: Diagnosis not present

## 2016-01-19 DIAGNOSIS — M818 Other osteoporosis without current pathological fracture: Secondary | ICD-10-CM

## 2016-01-19 MED ORDER — ATORVASTATIN CALCIUM 10 MG PO TABS: 10.0000 mg | ORAL_TABLET | Freq: Every day | ORAL | 3 refills | Status: DC

## 2016-01-19 MED ORDER — ALPRAZOLAM 0.5 MG PO TABS: 0.5000 mg | ORAL_TABLET | Freq: Every evening | ORAL | 3 refills | Status: DC | PRN

## 2016-01-19 NOTE — Progress Notes (Signed)
Pre visit review using our clinic review tool, if applicable. No additional management support is needed unless otherwise documented below in the visit note. 

## 2016-01-19 NOTE — Assessment & Plan Note (Signed)
With caregiver stress  Decided against counseling Reviewed stressors/ coping techniques/symptoms/ support sources/ tx options and side effects in detail today On zoloft Refilled xanax for local pharmacy

## 2016-01-19 NOTE — Assessment & Plan Note (Signed)
Vitamin D level is therapeutic with current supplementation Disc importance of this to bone and overall health  

## 2016-01-19 NOTE — Progress Notes (Signed)
Subjective:    Patient ID: [name] [name], female    DOB: Nov 23, 1934, 82 y.o.   MRN: [id number]  HPI  Here for annual f/u of chronic health problems   Getting over a cold since xmas eve   Had AMW on 11/27 Failed hearing - she has trouble hearing on the phone - her children have noted  Is interested in audiology referral   Depression score 12 Caring for sick spouse -caregiver stress  He has pulmonary fibrosis  Does not think he is ready for hospice/ perhaps palliative care  Very stressed  Decided against counseling last time (cannot get out to do it)  Worn out taking care of herself    She is also fatigued Lab Results  Component Value Date   VITAMINB12 680 12/14/2015    Nerve study R arm 11/28 Is going back in feb- has carpal tunnel   Wt Readings from Last 3 Encounters:  01/19/16 187 lb 12 oz (85.2 kg)  12/14/15 181 lb 4 oz (82.2 kg)  09/02/15 185 lb (83.9 kg)  bmi is 32.7 No time for exercise  Does eat regular meals Also severe arthritis -uses walker and cane   Mammogram 2/17-normal Self breast exam- no lumps /does not check regularly   dexa 6/16 OP of spine  Vit D level 56.3 Took evista 5 y  Hx of patellar fx in 2016  Colonoscopy 12/10 - 2 polyps  Interested on cologuard   Hx of hyperlipidemia Lab Results  Component Value Date   CHOL 180 12/14/2015   CHOL 173 05/28/2014   CHOL 179 12/27/2013   Lab Results  Component Value Date   HDL 80.90 12/14/2015   HDL 69.90 05/28/2014   HDL 79.60 12/27/2013   Lab Results  Component Value Date   LDLCALC 85 12/14/2015   LDLCALC 80 05/28/2014   LDLCALC 79 12/27/2013   Lab Results  Component Value Date   TRIG 68.0 12/14/2015   TRIG 114.0 05/28/2014   TRIG 100.0 12/27/2013   Lab Results  Component Value Date   CHOLHDL 2 12/14/2015   CHOLHDL 2 05/28/2014   CHOLHDL 2 12/27/2013   No results found for: LDLDIRECT atorvastatin and diet   Results for orders placed or performed in visit on 12/14/15    Lipid Panel  Result Value Ref Range   Cholesterol 180 0 - 200 mg/dL   Triglycerides 68.0 0.0 - 149.0 mg/dL   HDL 80.90 >39.00 mg/dL   VLDL 13.6 0.0 - 40.0 mg/dL   LDL Cholesterol 85 0 - 99 mg/dL   Total CHOL/HDL Ratio 2    NonHDL 99.07   TSH  Result Value Ref Range   TSH 1.33 0.35 - 4.50 uIU/mL  Comprehensive metabolic panel  Result Value Ref Range   Sodium 141 135 - 145 mEq/L   Potassium 4.2 3.5 - 5.1 mEq/L   Chloride 105 96 - 112 mEq/L   CO2 29 19 - 32 mEq/L   Glucose, Bld 98 70 - 99 mg/dL   BUN 15 6 - 23 mg/dL   Creatinine, Ser 0.88 0.40 - 1.20 mg/dL   Total Bilirubin 0.6 0.2 - 1.2 mg/dL   Alkaline Phosphatase 67 39 - 117 U/L   AST 19 0 - 37 U/L   ALT 14 0 - 35 U/L   Total Protein 7.4 6.0 - 8.3 g/dL   Albumin 4.4 3.5 - 5.2 g/dL   Calcium 9.6 8.4 - 10.5 mg/dL   GFR 65.90 >60.00 mL/min  CBC  Result Value Ref Range   WBC 4.5 4.0 - 10.5 K/uL   RBC 4.77 3.87 - 5.11 Mil/uL   Platelets 279.0 150.0 - 400.0 K/uL   Hemoglobin 12.7 12.0 - 15.0 g/dL   HCT 38.8 36.0 - 46.0 %   MCV 81.2 78.0 - 100.0 fl   MCHC 32.7 30.0 - 36.0 g/dL   RDW 16.2 (H) 11.5 - 15.5 %  Vitamin D, 25-hydroxy  Result Value Ref Range   VITD 56.34 30.00 - 100.00 ng/mL  Vitamin B12  Result Value Ref Range   Vitamin B-12 680 211 - 911 pg/mL  Hemoglobin A1c  Result Value Ref Range   Hgb A1c MFr Bld 6.0 4.6 - 6.5 %    Patient Active Problem List   Diagnosis Date Noted  . Hearing loss 01/19/2016  . Right wrist pain 12/02/2015  . Carpal tunnel syndrome 08/31/2015  . Dyspnea and respiratory abnormality 08/05/2015  . Bibasilar crackles 08/05/2015  . Cough 06/11/2015  . Encounter for Medicare annual wellness exam 06/04/2014  . Obesity 06/04/2014  . Estrogen deficiency 06/04/2014  . Colon cancer screening 06/04/2014  . Dysphagia, pharyngoesophageal phase 03/27/2014  . Schatzki's ring 03/27/2014  . Patellar tendon rupture 02/21/2014  . Hypokalemia 02/17/2014  . Dysphagia 10/17/2011  . OA  (osteoarthritis) of knee 08/16/2011  . Hemorrhoids 08/10/2011  . Other dyspnea and respiratory abnormality 06/21/2010  . Urinary incontinence 05/27/2010  . OVERACTIVE BLADDER 03/12/2010  . DEGENERATIVE DISC DISEASE 04/13/2009  . GERD 10/15/2008  . CEREBROVASCULAR ACCIDENT, HX OF 10/15/2008  . ARTHRITIS, GENERALIZED 06/26/2008  . EXTERNAL HEMORRHOIDS 02/01/2008  . HIATAL HERNIA 02/01/2008  . DIVERTICULOSIS OF COLON 02/01/2008  . GASTRITIS, HX OF 02/01/2008  . Vitamin D deficiency 07/03/2007  . Osteoporosis of lumbar spine 06/13/2007  . Fatigue 06/13/2007  . HELICOBACTER PYLORI INFECTION 06/06/2006  . Hyperlipidemia 06/06/2006  . Generalized anxiety disorder 06/06/2006  . Adjustment disorder with mixed anxiety and depressed mood 06/06/2006  . MACULAR DEGENERATION 06/06/2006  . CVA 06/06/2006  . OSTEOARTHRITIS 06/06/2006  . PALPITATIONS 05/25/2006   Past Medical History:  Diagnosis Date  . Alternating constipation and diarrhea   . Anxiety state, unspecified   . Complication of anesthesia    slow to wake up with surgery several years ago   . Depressive disorder, not elsewhere classified   . Diaphragmatic hernia without mention of obstruction or gangrene   . Difficulty sleeping   . Diverticulosis of colon (without mention of hemorrhage)   . Esophageal reflux   . External hemorrhoids without mention of complication   . Fatigue   . Frequency of urination   . Helicobacter pylori (H. pylori)   . Hiatal hernia   . History of transfusion   . Lumbar spondylosis   . Macular degeneration (senile) of retina, unspecified   . Osteoarthrosis, unspecified whether generalized or localized, unspecified site 08-11-11   hx. "rhematoid arthritis", osteoarthritis, DDD, bursitis(hip)  . Osteoporosis   . Palpitations   . PONV (postoperative nausea and vomiting)   . Schatzki's ring   . Shortness of breath 08-11-11   with exertion only at present  . Unspecified cerebral artery occlusion with  cerebral infarction 08-11-11   '97/ '06( TIA)-affected lt. side, slight weakness L side  . Unspecified vitamin D deficiency    Past Surgical History:  Procedure Laterality Date  . ABDOMINAL HYSTERECTOMY  08-11-11  . APPENDECTOMY    . BACK SURGERY  08-11-11   '11-hx. lumbar fusion with retained hardware  . CATARACT EXTRACTION  08-11-11   Bilateral  . ESOPHAGEAL DILATION    . KNEE SURGERY  08-11-11   rt. knee scope  . PATELLAR TENDON REPAIR Left 02/22/2014   Procedure: PATELLA TENDON REPAIR;  Surgeon: Loanne Drilling, MD;  Location: [facility];  Service: Orthopedics;  Laterality: Left;  . PATELLAR TENDON REPAIR Left 06/25/2014   Procedure: LEFT PATELLA TENDON REPAIR;  Surgeon: Ollen Gross, MD;  Location: [facility];  Service: Orthopedics;  Laterality: Left;  . TOTAL KNEE ARTHROPLASTY  08/16/2011   Procedure: TOTAL KNEE ARTHROPLASTY;  Surgeon: Loanne Drilling, MD;  Location: [facility];  Service: Orthopedics;  Laterality: Right;  . TOTAL KNEE ARTHROPLASTY Left 01/27/2014   Procedure: LEFT TOTAL KNEE ARTHROPLASTY;  Surgeon: Loanne Drilling, MD;  Location: [facility];  Service: Orthopedics;  Laterality: Left;  . TUBAL LIGATION     Social History  Substance Use Topics  . Smoking status: Never Smoker  . Smokeless tobacco: Never Used  . Alcohol use No   Family History  Problem Relation Age of Onset  . Heart disease Father   . Coronary artery disease Brother   . Diabetes Brother   . Heart disease Brother   . Pancreatic cancer Daughter   . Diabetes Sister   . Colon cancer Neg Hx   . Colon polyps Neg Hx   . Esophageal cancer Neg Hx   . Gallbladder disease Neg Hx    Allergies  Allergen Reactions  . Alendronate Sodium     REACTION: JAW PAIN  . Celecoxib     REACTION: GI UPSET  . Nexium [Esomeprazole Magnesium] Nausea And Vomiting    Pt cannot take the generic Does fine with DAW  . Omeprazole Nausea Only  . Protonix [Pantoprazole Sodium] Nausea And Vomiting  . Rabeprazole Nausea And Vomiting  .  Latex Itching and Rash   Current Outpatient Prescriptions on File Prior to Visit  Medication Sig Dispense Refill  . acetaminophen (TYLENOL) 325 MG tablet Take 2 tablets (650 mg total) by mouth every 6 (six) hours as needed for mild pain (or Fever >/= 101). 40 tablet 0  . calcium carbonate (TUMS - DOSED IN MG ELEMENTAL CALCIUM) 500 MG chewable tablet Chew 1 tablet (200 mg of elemental calcium total) by mouth 4 (four) times daily as needed for indigestion or heartburn. 10 tablet 0  . clopidogrel (PLAVIX) 75 MG tablet Take 75 mg by mouth daily.    . fluticasone (FLONASE) 50 MCG/ACT nasal spray Place 1-2 sprays into both nostrils daily as needed for allergies or rhinitis.    Marland Kitchen HYDROcodone-acetaminophen (NORCO) 10-325 MG tablet Take 1 tablet by mouth every 6 (six) hours as needed. pain  0  . methocarbamol (ROBAXIN) 500 MG tablet Take 1 tablet (500 mg total) by mouth every 6 (six) hours as needed for muscle spasms. 80 tablet 0  . metoprolol succinate (TOPROL XL) 25 MG 24 hr tablet Take 0.5 tablets (12.5 mg total) by mouth at bedtime. 90 tablet 3  . Multiple Vitamins-Minerals (PRESERVISION AREDS PO) Take 1 capsule by mouth 2 (two) times daily.    Marland Kitchen NEXIUM 40 MG capsule TAKE 1 CAPSULE TWICE A DAY 180 capsule 3  . oxyCODONE (OXY IR/ROXICODONE) 5 MG immediate release tablet Take 1-2 tablets (5-10 mg total) by mouth every 3 (three) hours as needed for moderate pain, severe pain or breakthrough pain. 80 tablet 0  . Polyethyl Glycol-Propyl Glycol (SYSTANE OP) Apply 1-2 drops to eye at bedtime as needed (dry eyes.).    Marland Kitchen  sertraline (ZOLOFT) 100 MG tablet TAKE 2 TABLETS DAILY 180 tablet 3  . furosemide (LASIX) 20 MG tablet Take 1 tablet (20 mg total) by mouth daily. 90 tablet 3   No current facility-administered medications on file prior to visit.     Review of Systems Review of Systems  Constitutional: Negative for fever, appetite change, fatigue and unexpected weight change.  Eyes: Negative for pain and  visual disturbance.  Respiratory: Negative for cough and shortness of breath.   Cardiovascular: Negative for cp or palpitations    Gastrointestinal: Negative for nausea, diarrhea and constipation.  Genitourinary: Negative for urgency and frequency.  Skin: Negative for pallor or rash   Neurological: Negative for weakness, light-headedness, numbness and headaches.  MSK pos for arthritis chronic pain all over with mobility impairment  Hematological: Negative for adenopathy. Does not bruise/bleed easily.  Psychiatric/Behavioral: Negative for dysphoric mood. The patient is  nervous/anxious.  pos for stressors/caregiver stress       Objective:   Physical Exam  Constitutional: She appears well-developed and well-nourished. No distress.  obese and well appearing   HENT:  Head: Normocephalic and atraumatic.  Right Ear: External ear normal.  Left Ear: External ear normal.  Mouth/Throat: Oropharynx is clear and moist.  Nares are injected and congested  No sinus tenderness  Eyes: Conjunctivae and EOM are normal. Pupils are equal, round, and reactive to light. No scleral icterus.  Neck: Normal range of motion. Neck supple. No JVD present. Carotid bruit is not present. No thyromegaly present.  Cardiovascular: Normal rate, regular rhythm, normal heart sounds and intact distal pulses.  Exam reveals no gallop.   Pulmonary/Chest: Effort normal and breath sounds normal. No respiratory distress. She has no wheezes. She exhibits no tenderness.  Harsh bs with good air exch No wheeze or rales   Abdominal: Soft. Bowel sounds are normal. She exhibits no distension, no abdominal bruit and no mass. There is no tenderness.  Genitourinary: No breast swelling, tenderness, discharge or bleeding.  Genitourinary Comments: Breast exam: No mass, nodules, thickening, tenderness, bulging, retraction, inflamation, nipple discharge or skin changes noted.  No axillary or clavicular LA.      Musculoskeletal: She exhibits no  edema or tenderness.  Limited rom spine/knees and hips bilat Slow gait -assisted   Lymphadenopathy:    She has no cervical adenopathy.  Neurological: She is alert. She has normal reflexes. No cranial nerve deficit. She exhibits normal muscle tone. Coordination normal.  Skin: Skin is warm and dry. No rash noted. No erythema. No pallor.  sks and lentigines diffusely   Psychiatric: Her speech is normal and behavior is normal. Thought content normal. Her mood appears anxious. Her affect is not blunt, not labile and not inappropriate. Thought content is not paranoid. She does not exhibit a depressed mood. She expresses no homicidal and no suicidal ideation.  Seems generally fatigued  Pleasant and talkative           Assessment & Plan:   Problem List Items Addressed This Visit      Nervous and Auditory   Hearing loss    Ref to audiology for consideration of hearing aides       Relevant Orders   Ambulatory referral to Audiology     Musculoskeletal and Integument   Osteoporosis of lumbar spine - Primary    Finished evista Patellar fx in the past No new falls or fractures  Disc need for calcium/ vitamin D/ wt bearing exercise and bone density test every 2 y to  monitor Disc safety/ fracture risk in detail          Other   Vitamin D deficiency    Vitamin D level is therapeutic with current supplementation Disc importance of this to bone and overall health       Obesity    Discussed how this problem influences overall health and the risks it imposes  Reviewed plan for weight loss with lower calorie diet (via better food choices and also portion control or program like weight watchers) and exercise building up to or more than 30 minutes 5 days per week including some aerobic activity         Hyperlipidemia    Well controlled with atorvastatin and diet  Disc goals for lipids and reasons to control them Rev labs with pt Rev low sat fat diet in detail       Relevant  Medications   atorvastatin (LIPITOR) 10 MG tablet   Fatigue    Suspect due to care giving Lab Results  Component Value Date   VITAMINB12 680 12/14/2015   Labs re assuring      Colon cancer screening    Signed up for cologuard kit      Adjustment disorder with mixed anxiety and depressed mood    With caregiver stress  Decided against counseling Reviewed stressors/ coping techniques/symptoms/ support sources/ tx options and side effects in detail today On zoloft Refilled xanax for local pharmacy

## 2016-01-19 NOTE — Assessment & Plan Note (Signed)
Finished evista Patellar fx in the past No new falls or fractures  Disc need for calcium/ vitamin D/ wt bearing exercise and bone density test every 2 y to monitor Disc safety/ fracture risk in detail

## 2016-01-19 NOTE — Assessment & Plan Note (Signed)
Discussed how this problem influences overall health and the risks it imposes  Reviewed plan for weight loss with lower calorie diet (via better food choices and also portion control or program like weight watchers) and exercise building up to or more than 30 minutes 5 days per week including some aerobic activity    

## 2016-01-19 NOTE — Assessment & Plan Note (Signed)
Ref to audiology for consideration of hearing aides

## 2016-01-19 NOTE — Assessment & Plan Note (Signed)
Signed up for cologuard kit

## 2016-01-19 NOTE — Patient Instructions (Addendum)
Ask your husband's doctor if her is a candidate for palliative care  Don't forget to schedule your mammogram in February  We will sign you up for the cologuard program  Avoid sugar drinks including juices and cut back on sugar and simple carbohydrates  Stay as active as you can tolerate  Take care of yourself  Here is a px for xanax to get locally- use it with caution

## 2016-01-19 NOTE — Assessment & Plan Note (Signed)
Suspect due to care giving Lab Results  Component Value Date   VITAMINB12 680 12/14/2015   Labs re assuring

## 2016-01-19 NOTE — Assessment & Plan Note (Signed)
Well controlled with atorvastatin and diet  Disc goals for lipids and reasons to control them Rev labs with pt Rev low sat fat diet in detail  

## 2016-01-28 DIAGNOSIS — Z1212 Encounter for screening for malignant neoplasm of rectum: Secondary | ICD-10-CM | POA: Diagnosis not present

## 2016-01-28 DIAGNOSIS — Z1211 Encounter for screening for malignant neoplasm of colon: Secondary | ICD-10-CM | POA: Diagnosis not present

## 2016-02-03 LAB — COLOGUARD: COLOGUARD: NEGATIVE

## 2016-02-10 ENCOUNTER — Encounter: Payer: Self-pay | Admitting: *Deleted

## 2016-03-14 ENCOUNTER — Other Ambulatory Visit: Payer: Self-pay | Admitting: Family Medicine

## 2016-03-18 DIAGNOSIS — H524 Presbyopia: Secondary | ICD-10-CM | POA: Diagnosis not present

## 2016-03-18 DIAGNOSIS — H04123 Dry eye syndrome of bilateral lacrimal glands: Secondary | ICD-10-CM | POA: Diagnosis not present

## 2016-03-18 DIAGNOSIS — D4981 Neoplasm of unspecified behavior of retina and choroid: Secondary | ICD-10-CM | POA: Diagnosis not present

## 2016-03-18 DIAGNOSIS — H43813 Vitreous degeneration, bilateral: Secondary | ICD-10-CM | POA: Diagnosis not present

## 2016-03-18 DIAGNOSIS — H353132 Nonexudative age-related macular degeneration, bilateral, intermediate dry stage: Secondary | ICD-10-CM | POA: Diagnosis not present

## 2016-03-22 DIAGNOSIS — M1712 Unilateral primary osteoarthritis, left knee: Secondary | ICD-10-CM | POA: Diagnosis not present

## 2016-03-22 DIAGNOSIS — M47817 Spondylosis without myelopathy or radiculopathy, lumbosacral region: Secondary | ICD-10-CM | POA: Diagnosis not present

## 2016-04-19 ENCOUNTER — Other Ambulatory Visit: Payer: Self-pay | Admitting: Family Medicine

## 2016-04-19 DIAGNOSIS — Z1231 Encounter for screening mammogram for malignant neoplasm of breast: Secondary | ICD-10-CM

## 2016-06-03 ENCOUNTER — Encounter: Payer: Self-pay | Admitting: *Deleted

## 2016-06-03 DIAGNOSIS — M25552 Pain in left hip: Secondary | ICD-10-CM | POA: Diagnosis not present

## 2016-06-03 DIAGNOSIS — Z96652 Presence of left artificial knee joint: Secondary | ICD-10-CM | POA: Diagnosis not present

## 2016-06-03 DIAGNOSIS — Z471 Aftercare following joint replacement surgery: Secondary | ICD-10-CM | POA: Diagnosis not present

## 2016-06-03 DIAGNOSIS — M7062 Trochanteric bursitis, left hip: Secondary | ICD-10-CM | POA: Diagnosis not present

## 2016-06-03 DIAGNOSIS — Z4789 Encounter for other orthopedic aftercare: Secondary | ICD-10-CM | POA: Diagnosis not present

## 2016-06-22 DIAGNOSIS — M47817 Spondylosis without myelopathy or radiculopathy, lumbosacral region: Secondary | ICD-10-CM | POA: Diagnosis not present

## 2016-06-22 DIAGNOSIS — M1712 Unilateral primary osteoarthritis, left knee: Secondary | ICD-10-CM | POA: Diagnosis not present

## 2016-06-23 ENCOUNTER — Encounter (INDEPENDENT_AMBULATORY_CARE_PROVIDER_SITE_OTHER): Payer: Self-pay

## 2016-06-23 ENCOUNTER — Ambulatory Visit (INDEPENDENT_AMBULATORY_CARE_PROVIDER_SITE_OTHER): Payer: Medicare Other | Admitting: Cardiovascular Disease

## 2016-06-23 ENCOUNTER — Encounter: Payer: Self-pay | Admitting: Cardiovascular Disease

## 2016-06-23 VITALS — BP 124/68 | HR 64 | Ht 63.5 in | Wt 191.0 lb

## 2016-06-23 DIAGNOSIS — R002 Palpitations: Secondary | ICD-10-CM

## 2016-06-23 NOTE — Patient Instructions (Signed)

## 2016-06-23 NOTE — Progress Notes (Signed)
Cardiology Office Note Date:  06/24/2016   ID:  [name] [name], DOB [birth year], MRN [id number]  PCP:  Abner Greenspan, MD  Cardiologist:  Sherren Mocha, MD    Chief Complaint  Patient presents with  . Shortness of Breath     History of Present Illness: [name] [name] is a 82 y.o. female who presents for follow-up evaluation. The patient was last seen in May 2017 for palpitations. She has a history of TIA and has been maintained on clopidogrel. At the time of her last visit, she complained of chest pain, heart palpitations, and shortness of breath. A stress myoview was low-risk and an echo showed grade 2 diastolic dysfunction but was otherwise unremarkable. She was started on Toprol XL and palpitations resolved.  She's here alone today. Under a lot of stress with her husband's illness. He has home hospice for treatment of interstitial lung disease. She has stable DOE, no chest pain, no palpitations. Denies leg swelling, orthopnea, or PND.    Past Medical History:  Diagnosis Date  . Alternating constipation and diarrhea   . Anxiety state, unspecified   . Complication of anesthesia    slow to wake up with surgery several years ago   . Depressive disorder, not elsewhere classified   . Diaphragmatic hernia without mention of obstruction or gangrene   . Difficulty sleeping   . Diverticulosis of colon (without mention of hemorrhage)   . Esophageal reflux   . External hemorrhoids without mention of complication   . Fatigue   . Frequency of urination   . Helicobacter pylori (H. pylori)   . Hiatal hernia   . History of transfusion   . Lumbar spondylosis   . Macular degeneration (senile) of retina, unspecified   . Osteoarthrosis, unspecified whether generalized or localized, unspecified site 08-11-11   hx. "rhematoid arthritis", osteoarthritis, DDD, bursitis(hip)  . Osteoporosis   . Palpitations   . PONV (postoperative nausea and vomiting)   . Schatzki's ring   . Shortness of  breath 08-11-11   with exertion only at present  . Unspecified cerebral artery occlusion with cerebral infarction 08-11-11   '97/ '06( TIA)-affected lt. side, slight weakness L side  . Unspecified vitamin D deficiency     Past Surgical History:  Procedure Laterality Date  . ABDOMINAL HYSTERECTOMY  08-11-11  . APPENDECTOMY    . BACK SURGERY  08-11-11   '11-hx. lumbar fusion with retained hardware  . CATARACT EXTRACTION  08-11-11   Bilateral  . ESOPHAGEAL DILATION    . KNEE SURGERY  08-11-11   rt. knee scope  . PATELLAR TENDON REPAIR Left 02/22/2014   Procedure: PATELLA TENDON REPAIR;  Surgeon: Gearlean Alf, MD;  Location: [facility];  Service: Orthopedics;  Laterality: Left;  . PATELLAR TENDON REPAIR Left 06/25/2014   Procedure: LEFT PATELLA TENDON REPAIR;  Surgeon: Gaynelle Arabian, MD;  Location: [facility];  Service: Orthopedics;  Laterality: Left;  . TOTAL KNEE ARTHROPLASTY  08/16/2011   Procedure: TOTAL KNEE ARTHROPLASTY;  Surgeon: Gearlean Alf, MD;  Location: [facility];  Service: Orthopedics;  Laterality: Right;  . TOTAL KNEE ARTHROPLASTY Left 01/27/2014   Procedure: LEFT TOTAL KNEE ARTHROPLASTY;  Surgeon: Gearlean Alf, MD;  Location: [facility];  Service: Orthopedics;  Laterality: Left;  . TUBAL LIGATION      Current Outpatient Prescriptions  Medication Sig Dispense Refill  . acetaminophen (TYLENOL) 325 MG tablet Take 2 tablets (650 mg total) by mouth every 6 (six) hours as needed for  mild pain (or Fever >/= 101). 40 tablet 0  . ALPRAZolam (XANAX) 0.5 MG tablet Take 1 tablet (0.5 mg total) by mouth at bedtime as needed for sleep. 30 tablet 3  . calcium carbonate (TUMS - DOSED IN MG ELEMENTAL CALCIUM) 500 MG chewable tablet Chew 1 tablet (200 mg of elemental calcium total) by mouth 4 (four) times daily as needed for indigestion or heartburn. 10 tablet 0  . clopidogrel (PLAVIX) 75 MG tablet TAKE 1 TABLET DAILY 90 tablet 1  . diclofenac sodium (VOLTAREN) 1 % GEL Apply 1 application topically 2 (two)  times daily.    . fluticasone (FLONASE) 50 MCG/ACT nasal spray Place 1-2 sprays into both nostrils daily as needed for allergies or rhinitis.    Marland Kitchen HYDROcodone-acetaminophen (NORCO) 10-325 MG tablet Take 1 tablet by mouth every 6 (six) hours as needed. pain  0  . methocarbamol (ROBAXIN) 500 MG tablet Take 1 tablet (500 mg total) by mouth every 6 (six) hours as needed for muscle spasms. 80 tablet 0  . metoprolol succinate (TOPROL XL) 25 MG 24 hr tablet Take 0.5 tablets (12.5 mg total) by mouth at bedtime. 90 tablet 3  . Multiple Vitamins-Minerals (PRESERVISION AREDS PO) Take 1 capsule by mouth 2 (two) times daily.    Marland Kitchen NEXIUM 40 MG capsule TAKE 1 CAPSULE TWICE A DAY 180 capsule 3  . Polyethyl Glycol-Propyl Glycol (SYSTANE OP) Apply 1-2 drops to eye at bedtime as needed (dry eyes.).    Marland Kitchen sertraline (ZOLOFT) 100 MG tablet TAKE 2 TABLETS DAILY 180 tablet 3  . furosemide (LASIX) 20 MG tablet Take 1 tablet (20 mg total) by mouth daily. 90 tablet 3   No current facility-administered medications for this visit.     Allergies:   Alendronate sodium; Alendronate sodium; Celecoxib; Nexium [esomeprazole magnesium]; Omeprazole; Protonix [pantoprazole sodium]; Rabeprazole; and Latex   Social History:  The patient  reports that she has never smoked. She has never used smokeless tobacco. She reports that she does not drink alcohol or use drugs.   Family History:  The patient's  family history includes Coronary artery disease in her brother; Diabetes in her brother and sister; Heart disease in her brother and father; Pancreatic cancer in her daughter.    ROS:  Please see the history of present illness.  Otherwise, review of systems is positive for weight gain, hearing loss, visual changes, cough, DOE, depression, back pain, muscle pain, dizziness, excessive sweating, excessive fatigue, anxiety, joint swelling, balance problems, and headaches.  All other systems are reviewed and negative.    PHYSICAL EXAM: VS:   BP 124/68   Pulse 64   Ht 5' 3.5" (1.613 m)   Wt 191 lb (86.6 kg)   BMI 33.30 kg/m  , BMI Body mass index is 33.3 kg/m. GEN: Well nourished, well developed, in no acute distress  HEENT: normal  Neck: no JVD, no masses. No carotid bruits Cardiac: RRR without murmur or gallop                Respiratory:  clear to auscultation bilaterally, normal work of breathing GI: soft, nontender, nondistended, + BS MS: no deformity or atrophy  Ext: no pretibial edema, pedal pulses 2+= bilaterally Skin: warm and dry, no rash Neuro:  Strength and sensation are intact Psych: euthymic mood, full affect  EKG:  EKG is ordered today. The ekg ordered today shows NSr 64 bpm, within normal limits  Recent Labs: 12/14/2015: ALT 14; BUN 15; Creatinine, Ser 0.88; Hemoglobin 12.7; Platelets  279.0; Potassium 4.2; Sodium 141; TSH 1.33   Lipid Panel     Component Value Date/Time   CHOL 180 12/14/2015 1138   TRIG 68.0 12/14/2015 1138   HDL 80.90 12/14/2015 1138   CHOLHDL 2 12/14/2015 1138   VLDL 13.6 12/14/2015 1138   LDLCALC 85 12/14/2015 1138      Wt Readings from Last 3 Encounters:  06/23/16 191 lb (86.6 kg)  01/19/16 187 lb 12 oz (85.2 kg)  12/14/15 181 lb 4 oz (82.2 kg)     Cardiac Studies Reviewed: Myocardial Perfusion Study 06-24-2015: Study Highlights    Nuclear stress EF: 65%.  There was no ST segment deviation noted during stress.  There is a small defect of mild severity present in the basal inferoseptal and mid inferoseptal location. The defect is non-reversible with no ischemia noted.  There is a small defect of moderate severity present in the apical lateral and apex location. The defect is non-reversible and consistent with breast attenuation and diaphragatic attenuation artifact. No ischemia noted.  This is a low risk study.  The left ventricular ejection fraction is normal (55-65%).     Echo 06-24-2015: Study Conclusions  - Left ventricle: The cavity size was normal. Wall  thickness was   normal. Systolic function was normal. The estimated ejection   fraction was in the range of 60% to 65%. Wall motion was normal;   there were no regional wall motion abnormalities. Features are   consistent with a pseudonormal left ventricular filling pattern,   with concomitant abnormal relaxation and increased filling   pressure (grade 2 diastolic dysfunction). - Aortic valve: Sclerosis without stenosis. There was trivial   regurgitation. - Mitral valve: There was trivial regurgitation. - Left atrium: The atrium was moderately dilated. - Right ventricle: The cavity size was normal. Systolic function   was normal. - Tricuspid valve: Peak RV-RA gradient (S): 26 mm Hg. - Pulmonary arteries: PA peak pressure: 29 mm Hg (S). - Inferior vena cava: The vessel was normal in size. The   respirophasic diameter changes were in the normal range (= 50%),   consistent with normal central venous pressure.  Impressions:  - Normal LV size with EF 60-65%. Moderate diastolic dysfunction.   Normal RV size and systolic function. Aortic sclerosis without   significant stenosis.   ASSESSMENT AND PLAN: 1.  Palpitations: well-controlled on Toprol XL. Continue same Rx.   2. TIA: continue clopidogrel.  3. Diastolic dysfunction: BP well-controlled. No edema or other evidence of volume overload. DOE is multifactorial. Pt functionally limited by knee problems - ambulates with walker.   Current medicines are reviewed with the patient today.  The patient does not have concerns regarding medicines.  Labs/ tests ordered today include:   Orders Placed This Encounter  Procedures  . EKG 12-Lead   Disposition:   FU one year  Signed, Sherren Mocha, MD  06/24/2016 4:44 PM    [facility] [place], [place], [place]  [address] Phone: [phone]; Fax: [phone]

## 2016-06-24 ENCOUNTER — Ambulatory Visit: Payer: Medicare Other | Admitting: Cardiovascular Disease

## 2016-06-24 ENCOUNTER — Ambulatory Visit
Admission: RE | Admit: 2016-06-24 | Discharge: 2016-06-24 | Disposition: A | Discharge status: Home / Self Care | Payer: Medicare Other | Source: Ambulatory Visit | Attending: Family Medicine | Admitting: Family Medicine

## 2016-06-24 DIAGNOSIS — Z1231 Encounter for screening mammogram for malignant neoplasm of breast: Secondary | ICD-10-CM | POA: Diagnosis not present

## 2016-06-28 ENCOUNTER — Encounter: Payer: Self-pay | Admitting: *Deleted

## 2016-06-30 ENCOUNTER — Other Ambulatory Visit: Payer: Self-pay | Admitting: Family Medicine

## 2016-06-30 NOTE — Telephone Encounter (Signed)
CPE was 01/19/16, last filled on 07/06/15 #180 tabs with 3 additional refills, please advise

## 2016-06-30 NOTE — Telephone Encounter (Signed)
done

## 2016-06-30 NOTE — Telephone Encounter (Signed)
Please refill for 6 months 

## 2016-07-15 ENCOUNTER — Other Ambulatory Visit: Payer: Self-pay | Admitting: Family Medicine

## 2016-07-15 NOTE — Telephone Encounter (Signed)
CPE was done on 01/19/16, last filled on 01/19/16 #30 tabs with 3 additional refills, please advise

## 2016-07-15 NOTE — Telephone Encounter (Signed)
Px written for call in   

## 2016-07-15 NOTE — Telephone Encounter (Signed)
Rx called in as prescribed 

## 2016-07-26 ENCOUNTER — Telehealth: Payer: Self-pay | Admitting: *Deleted

## 2016-07-26 ENCOUNTER — Telehealth: Payer: Self-pay

## 2016-07-26 MED ORDER — ALPRAZOLAM 0.5 MG PO TABS
0.5000 mg | ORAL_TABLET | Freq: Every evening | ORAL | 1 refills | Status: DC | PRN
Start: 2016-07-26 — End: 2018-05-28

## 2016-07-26 NOTE — Telephone Encounter (Signed)
Pt request a medical necessity form filled out and brand name authorization form filled out for nexium.pt [facility]; call [phone]. Pt also thinks the refill on 06/2016 was [facility] not [facility].Please advise. Pt request cb.

## 2016-07-26 NOTE — Telephone Encounter (Signed)
Printed to fax to mail order  In IN box  thanks

## 2016-07-26 NOTE — Telephone Encounter (Signed)
Fax refill request. Last filled on 07/15/16 #30 tabs with 3 refills however I verified with pharmacy that pt didn't pick up Rx due to it needing to go to mail order. Per pharmacy last filled on 06/18/16 #30. If approved I would need a paper Rx to fax to pt's mail order pharmacy. Pt had CPE on 01/19/16, please advise

## 2016-07-27 MED ORDER — NEXIUM 40 MG PO CPDR: 40.0000 mg | DELAYED_RELEASE_CAPSULE | Freq: Two times a day (BID) | ORAL | 3 refills | Status: DC

## 2016-07-27 NOTE — Telephone Encounter (Signed)
Form received and placed in your inbox.

## 2016-07-27 NOTE — Telephone Encounter (Signed)
Called local pharmacy and cancelled Rx. Rx faxed to Mail order pharmacy

## 2016-07-27 NOTE — Telephone Encounter (Signed)
PA done Please re send medication for a year  In IN box  Thanks

## 2016-07-27 NOTE — Telephone Encounter (Signed)
PA faxed Rx filled

## 2016-07-27 NOTE — Telephone Encounter (Signed)
Rx was never [facility] to [facility], called them and they are faxing over the form to start the PA for name brand

## 2016-07-29 NOTE — Telephone Encounter (Signed)
This is documented on the wrong phone note there is a separate phone note about pt's PA and nexium. I addressed this on that note

## 2016-07-29 NOTE — Telephone Encounter (Signed)
Patient returned Shapale's call.  I let her know that rx was cancelled at pharmacy and faxed to mail order pharmacy.

## 2016-07-29 NOTE — Telephone Encounter (Signed)
Patient said she called the pharmacy.  Patient spoke to mail order pharmacy. They said her insurance does pay for her Nexium.  They said for you to call [phone] and ask for prior authorization form or go online and fill out the prior authorization form. Patient would like it done today.  Please call patient at [phone].

## 2016-07-29 NOTE — Telephone Encounter (Signed)
Please tell pt that even though we let [facility] know she cannot tolerate generic nexium they will not cover it  Letter states that this does not meet the [employer] criteria for medical necessity   Has she tried other ppis? (generic prilosec/protonix/aciphex/prevacid) before and have any of them worked  If she does not want to pay out of pocket we will have to change med

## 2016-07-29 NOTE — Telephone Encounter (Signed)
Pt notified PA was denied. Pt will check with insurance to see if there is an alt that a little cheaper then the nexium, but for now she said hold off on the Rx. I advise pt we filled her nexium already so she just needs to call [facility] and cancel the Rx if she doesn't want it due to cost

## 2016-07-29 NOTE — Telephone Encounter (Signed)
Left voicemail requesting pt to call the office back 

## 2016-07-29 NOTE — Telephone Encounter (Signed)
Received fax saying PA for name brand Nexium was denied, letter placed in your inbox, please advise

## 2016-09-06 ENCOUNTER — Other Ambulatory Visit: Payer: Self-pay | Admitting: Cardiovascular Disease

## 2016-09-10 ENCOUNTER — Other Ambulatory Visit: Payer: Self-pay | Admitting: Family Medicine

## 2016-10-25 DIAGNOSIS — Z23 Encounter for immunization: Secondary | ICD-10-CM | POA: Diagnosis not present

## 2016-12-09 ENCOUNTER — Other Ambulatory Visit: Payer: Self-pay | Admitting: Family Medicine

## 2016-12-12 MED ORDER — CLOPIDOGREL BISULFATE 75 MG PO TABS: 75.0000 mg | ORAL_TABLET | Freq: Every day | ORAL | 0 refills | Status: DC

## 2016-12-12 NOTE — Addendum Note (Signed)
Addended by: Jacqualin Combes on: 12/12/2016 11:03 AM   Modules accepted: Orders

## 2016-12-27 ENCOUNTER — Other Ambulatory Visit: Payer: Self-pay | Admitting: Family Medicine

## 2016-12-29 NOTE — Telephone Encounter (Signed)
Please schedule f/u in feb or march and refill until then

## 2016-12-29 NOTE — Telephone Encounter (Signed)
No recent or future appts., please advise  

## 2016-12-30 NOTE — Telephone Encounter (Signed)
appt scheduled and med refilled 

## 2017-01-24 ENCOUNTER — Telehealth: Payer: Self-pay

## 2017-01-24 NOTE — Telephone Encounter (Signed)
Handicap parking placard form placed on in box shelf for Dr Glori Bickers to complete. Pt request completed form be mailed to her confirmed home address.

## 2017-01-24 NOTE — Telephone Encounter (Signed)
Form mailed to home address on file

## 2017-01-24 NOTE — Telephone Encounter (Signed)
Copied from [place] [phone]. Topic: Inquiry >> Jan 24, 2017  8:46 AM Ahmed Prima L wrote: Patient states her handicap sticker has expired & she wants to know what she needs to further do to get it renewed.  Please call patient back [phone]

## 2017-01-24 NOTE — Telephone Encounter (Signed)
Done and in IN box 

## 2017-01-25 ENCOUNTER — Telehealth: Payer: Self-pay

## 2017-01-25 NOTE — Telephone Encounter (Signed)
Attempted to reach patient. Attempt unsuccessful. Left message on patient's phone regarding appointment changes. Patient encouraged to contact [facility] if there is a schedule conflict.

## 2017-01-26 ENCOUNTER — Telehealth: Payer: Self-pay | Admitting: Family Medicine

## 2017-01-26 DIAGNOSIS — K219 Gastro-esophageal reflux disease without esophagitis: Secondary | ICD-10-CM

## 2017-01-26 DIAGNOSIS — E7800 Pure hypercholesterolemia, unspecified: Secondary | ICD-10-CM

## 2017-01-26 DIAGNOSIS — R7303 Prediabetes: Secondary | ICD-10-CM

## 2017-01-26 DIAGNOSIS — E876 Hypokalemia: Secondary | ICD-10-CM

## 2017-01-26 DIAGNOSIS — E559 Vitamin D deficiency, unspecified: Secondary | ICD-10-CM

## 2017-01-26 DIAGNOSIS — I6350 Cerebral infarction due to unspecified occlusion or stenosis of unspecified cerebral artery: Secondary | ICD-10-CM

## 2017-01-26 NOTE — Telephone Encounter (Signed)
-----   Message from Ellamae Sia sent at 01/25/2017  6:29 PM EST ----- Regarding: Lab orders for Wednesday, 1.16.19 Patient is scheduled for CPX labs, please order future labs, Thanks , Karna Christmas

## 2017-01-27 DIAGNOSIS — M4716 Other spondylosis with myelopathy, lumbar region: Secondary | ICD-10-CM | POA: Diagnosis not present

## 2017-01-27 DIAGNOSIS — M4316 Spondylolisthesis, lumbar region: Secondary | ICD-10-CM | POA: Diagnosis not present

## 2017-01-31 ENCOUNTER — Other Ambulatory Visit: Payer: Self-pay | Admitting: Neurosurgery

## 2017-01-31 ENCOUNTER — Other Ambulatory Visit: Payer: Self-pay | Admitting: Family Medicine

## 2017-01-31 DIAGNOSIS — M4716 Other spondylosis with myelopathy, lumbar region: Secondary | ICD-10-CM

## 2017-01-31 NOTE — Telephone Encounter (Signed)
Pt said Rx was d/c in error, pt is still taking Rx and never stopped, Rx filled and pt is aware

## 2017-01-31 NOTE — Telephone Encounter (Signed)
Please ask her about that

## 2017-01-31 NOTE — Telephone Encounter (Signed)
Not on med list and it looks like Rx was d/c in June 2018 when she saw her cardiologist, please advise

## 2017-02-01 ENCOUNTER — Ambulatory Visit: Payer: Medicare Other

## 2017-02-01 ENCOUNTER — Other Ambulatory Visit (INDEPENDENT_AMBULATORY_CARE_PROVIDER_SITE_OTHER): Payer: Medicare Other

## 2017-02-01 DIAGNOSIS — E876 Hypokalemia: Secondary | ICD-10-CM | POA: Diagnosis not present

## 2017-02-01 DIAGNOSIS — K219 Gastro-esophageal reflux disease without esophagitis: Secondary | ICD-10-CM

## 2017-02-01 DIAGNOSIS — R7303 Prediabetes: Secondary | ICD-10-CM | POA: Diagnosis not present

## 2017-02-01 DIAGNOSIS — E7800 Pure hypercholesterolemia, unspecified: Secondary | ICD-10-CM

## 2017-02-01 DIAGNOSIS — I6350 Cerebral infarction due to unspecified occlusion or stenosis of unspecified cerebral artery: Secondary | ICD-10-CM

## 2017-02-01 DIAGNOSIS — E559 Vitamin D deficiency, unspecified: Secondary | ICD-10-CM

## 2017-02-01 LAB — CBC WITH DIFFERENTIAL/PLATELET
BASOS PCT: 1.1 % (ref 0.0–3.0)
Basophils Absolute: 0.1 10*3/uL (ref 0.0–0.1)
EOS PCT: 3.9 % (ref 0.0–5.0)
Eosinophils Absolute: 0.2 10*3/uL (ref 0.0–0.7)
HEMATOCRIT: 40.7 % (ref 36.0–46.0)
Hemoglobin: 13.3 g/dL (ref 12.0–15.0)
LYMPHS PCT: 34.2 % (ref 12.0–46.0)
Lymphs Abs: 1.8 10*3/uL (ref 0.7–4.0)
MCHC: 32.6 g/dL (ref 30.0–36.0)
MCV: 81.6 fl (ref 78.0–100.0)
MONOS PCT: 9.7 % (ref 3.0–12.0)
Monocytes Absolute: 0.5 10*3/uL (ref 0.1–1.0)
Neutro Abs: 2.7 10*3/uL (ref 1.4–7.7)
Neutrophils Relative %: 51.1 % (ref 43.0–77.0)
Platelets: 302 10*3/uL (ref 150.0–400.0)
RBC: 4.99 Mil/uL (ref 3.87–5.11)
RDW: 15.7 % — AB (ref 11.5–15.5)
WBC: 5.2 10*3/uL (ref 4.0–10.5)

## 2017-02-01 LAB — COMPREHENSIVE METABOLIC PANEL
ALBUMIN: 4.5 g/dL (ref 3.5–5.2)
ALT: 12 U/L (ref 0–35)
AST: 20 U/L (ref 0–37)
Alkaline Phosphatase: 71 U/L (ref 39–117)
BUN: 15 mg/dL (ref 6–23)
CALCIUM: 9.6 mg/dL (ref 8.4–10.5)
CHLORIDE: 102 meq/L (ref 96–112)
CO2: 30 mEq/L (ref 19–32)
Creatinine, Ser: 0.89 mg/dL (ref 0.40–1.20)
GFR: 64.86 mL/min (ref >60.00)
Glucose, Bld: 97 mg/dL (ref 70–99)
POTASSIUM: 4.6 meq/L (ref 3.5–5.1)
Sodium: 140 mEq/L (ref 135–145)
Total Bilirubin: 0.6 mg/dL (ref 0.2–1.2)
Total Protein: 7.5 g/dL (ref 6.0–8.3)

## 2017-02-01 LAB — LIPID PANEL
Cholesterol: 164 mg/dL (ref 0–200)
HDL: 75.8 mg/dL (ref >39.00)
LDL CALC: 68 mg/dL (ref 0–99)
NonHDL: 88.37
TRIGLYCERIDES: 101 mg/dL (ref 0.0–149.0)
Total CHOL/HDL Ratio: 2
VLDL: 20.2 mg/dL (ref 0.0–40.0)

## 2017-02-01 LAB — TSH: TSH: 3.09 u[IU]/mL (ref 0.35–4.50)

## 2017-02-01 LAB — HEMOGLOBIN A1C: Hgb A1c MFr Bld: 6.2 % (ref 4.6–6.5)

## 2017-02-01 LAB — VITAMIN D 25 HYDROXY (VIT D DEFICIENCY, FRACTURES): VITD: 53.61 ng/mL (ref 30.00–100.00)

## 2017-02-07 ENCOUNTER — Ambulatory Visit
Admission: RE | Admit: 2017-02-07 | Discharge: 2017-02-07 | Disposition: A | Discharge status: Home / Self Care | Payer: Medicare Other | Source: Ambulatory Visit | Attending: Neurosurgery | Admitting: Neurosurgery

## 2017-02-07 DIAGNOSIS — M4326 Fusion of spine, lumbar region: Secondary | ICD-10-CM | POA: Diagnosis not present

## 2017-02-07 DIAGNOSIS — M4716 Other spondylosis with myelopathy, lumbar region: Secondary | ICD-10-CM

## 2017-02-07 DIAGNOSIS — M48061 Spinal stenosis, lumbar region without neurogenic claudication: Secondary | ICD-10-CM | POA: Diagnosis not present

## 2017-02-08 ENCOUNTER — Ambulatory Visit (INDEPENDENT_AMBULATORY_CARE_PROVIDER_SITE_OTHER): Payer: Medicare Other | Admitting: Family Medicine

## 2017-02-08 ENCOUNTER — Ambulatory Visit (INDEPENDENT_AMBULATORY_CARE_PROVIDER_SITE_OTHER): Payer: Medicare Other

## 2017-02-08 ENCOUNTER — Encounter: Payer: Self-pay | Admitting: Family Medicine

## 2017-02-08 VITALS — BP 118/70 | HR 73 | Temp 97.8°F | Ht 63.25 in | Wt 193.5 lb

## 2017-02-08 DIAGNOSIS — I6350 Cerebral infarction due to unspecified occlusion or stenosis of unspecified cerebral artery: Secondary | ICD-10-CM | POA: Diagnosis not present

## 2017-02-08 DIAGNOSIS — F411 Generalized anxiety disorder: Secondary | ICD-10-CM

## 2017-02-08 DIAGNOSIS — Z1211 Encounter for screening for malignant neoplasm of colon: Secondary | ICD-10-CM

## 2017-02-08 DIAGNOSIS — K219 Gastro-esophageal reflux disease without esophagitis: Secondary | ICD-10-CM | POA: Diagnosis not present

## 2017-02-08 DIAGNOSIS — R7303 Prediabetes: Secondary | ICD-10-CM

## 2017-02-08 DIAGNOSIS — E7800 Pure hypercholesterolemia, unspecified: Secondary | ICD-10-CM

## 2017-02-08 DIAGNOSIS — Z6832 Body mass index (BMI) 32.0-32.9, adult: Secondary | ICD-10-CM | POA: Diagnosis not present

## 2017-02-08 DIAGNOSIS — F4323 Adjustment disorder with mixed anxiety and depressed mood: Secondary | ICD-10-CM | POA: Diagnosis not present

## 2017-02-08 DIAGNOSIS — Z0000 Encounter for general adult medical examination without abnormal findings: Secondary | ICD-10-CM

## 2017-02-08 DIAGNOSIS — M810 Age-related osteoporosis without current pathological fracture: Secondary | ICD-10-CM

## 2017-02-08 DIAGNOSIS — M818 Other osteoporosis without current pathological fracture: Secondary | ICD-10-CM

## 2017-02-08 DIAGNOSIS — E6609 Other obesity due to excess calories: Secondary | ICD-10-CM | POA: Diagnosis not present

## 2017-02-08 DIAGNOSIS — E2839 Other primary ovarian failure: Secondary | ICD-10-CM

## 2017-02-08 DIAGNOSIS — E559 Vitamin D deficiency, unspecified: Secondary | ICD-10-CM | POA: Diagnosis not present

## 2017-02-08 MED ORDER — NEXIUM 40 MG PO CPDR: 40.0000 mg | DELAYED_RELEASE_CAPSULE | Freq: Two times a day (BID) | ORAL | 3 refills | Status: DC

## 2017-02-08 MED ORDER — ATORVASTATIN CALCIUM 10 MG PO TABS: 10.0000 mg | ORAL_TABLET | Freq: Every day | ORAL | 3 refills | Status: DC

## 2017-02-08 MED ORDER — SERTRALINE HCL 100 MG PO TABS: 200.0000 mg | ORAL_TABLET | Freq: Every day | ORAL | 3 refills | Status: DC

## 2017-02-08 MED ORDER — CLOPIDOGREL BISULFATE 75 MG PO TABS: 75.0000 mg | ORAL_TABLET | Freq: Every day | ORAL | 3 refills | Status: DC

## 2017-02-08 NOTE — Progress Notes (Signed)
Pre visit review using our clinic review tool, if applicable. No additional management support is needed unless otherwise documented below in the visit note. 

## 2017-02-08 NOTE — Progress Notes (Signed)
Subjective:    Patient ID: [name] [name], female    DOB: 11-12-1934, 83 y.o.   MRN: [id number]  HPI  Here for annual f/u of chronic health problems   Wt Readings from Last 3 Encounters:  02/08/17 193 lb 8 oz (87.8 kg)  02/08/17 193 lb 8 oz (87.8 kg)  06/23/16 191 lb (86.6 kg)  wt is stable  34.01 kg/m   Had amw today Some gaps in hearing exam Noted lots of stressors with depression score of 18 (lost her husband)  She just keeps on going  Continues her zoloft of 100 mg  Does not want to see a counselor  Her kids are helpful   She stays tired   Deals with severe chronic pain   Plans to start hospice grief counseling tomorrow  Also faith is helpful  Going to try to get out more instead of sitting around   Cleaning up 63 years of her husband's things  It is a lot!  Mammogram 6/18-nl  Self breast exam - no lumps but does not even check often  dexa 6/16 Hx of bone loss Completed a course of evista No falls or fractures  D level is 53  Will refer for dexa   zostavax 3/15  Colon screening  cologuard neg 1/18- good for 3 y    Blood pressure  BP Readings from Last 3 Encounters:  02/08/17 118/70  02/08/17 118/70  06/23/16 124/68   Hx of CVA in the past  No more symptoms   Hyperlipidemia  Lab Results  Component Value Date   CHOL 164 02/01/2017   CHOL 180 12/14/2015   CHOL 173 05/28/2014   Lab Results  Component Value Date   HDL 75.80 02/01/2017   HDL 80.90 12/14/2015   HDL 69.90 05/28/2014   Lab Results  Component Value Date   LDLCALC 68 02/01/2017   LDLCALC 85 12/14/2015   LDLCALC 80 05/28/2014   Lab Results  Component Value Date   TRIG 101.0 02/01/2017   TRIG 68.0 12/14/2015   TRIG 114.0 05/28/2014   Lab Results  Component Value Date   CHOLHDL 2 02/01/2017   CHOLHDL 2 12/14/2015   CHOLHDL 2 05/28/2014   No results found for: LDLDIRECT Atorvastatin and diet  Very good profile   Prediabetes Lab Results  Component Value Date   HGBA1C 6.2 02/01/2017  up from 6.0  She stays as active as she can be   Lab Results  Component Value Date   WBC 5.2 02/01/2017   HGB 13.3 02/01/2017   HCT 40.7 02/01/2017   MCV 81.6 02/01/2017   PLT 302.0 02/01/2017   Lab Results  Component Value Date   CREATININE 0.89 02/01/2017   BUN 15 02/01/2017   NA 140 02/01/2017   K 4.6 02/01/2017   CL 102 02/01/2017   CO2 30 02/01/2017    Lab Results  Component Value Date   ALT 12 02/01/2017   AST 20 02/01/2017   ALKPHOS 71 02/01/2017   BILITOT 0.6 02/01/2017    Lab Results  Component Value Date   TSH 3.09 02/01/2017     Patient Active Problem List   Diagnosis Date Noted  . Prediabetes 01/26/2017  . Hearing loss 01/19/2016  . Right wrist pain 12/02/2015  . Carpal tunnel syndrome 08/31/2015  . Dyspnea and respiratory abnormality 08/05/2015  . Bibasilar crackles 08/05/2015  . Cough 06/11/2015  . Encounter for Medicare annual wellness exam 06/04/2014  . Obesity 06/04/2014  . Estrogen deficiency  06/04/2014  . Colon cancer screening 06/04/2014  . Dysphagia, pharyngoesophageal phase 03/27/2014  . Schatzki's ring 03/27/2014  . Patellar tendon rupture 02/21/2014  . Hypokalemia 02/17/2014  . Dysphagia 10/17/2011  . OA (osteoarthritis) of knee 08/16/2011  . Hemorrhoids 08/10/2011  . Other dyspnea and respiratory abnormality 06/21/2010  . Urinary incontinence 05/27/2010  . OVERACTIVE BLADDER 03/12/2010  . DEGENERATIVE DISC DISEASE 04/13/2009  . GERD 10/15/2008  . CEREBROVASCULAR ACCIDENT, HX OF 10/15/2008  . ARTHRITIS, GENERALIZED 06/26/2008  . EXTERNAL HEMORRHOIDS 02/01/2008  . HIATAL HERNIA 02/01/2008  . DIVERTICULOSIS OF COLON 02/01/2008  . GASTRITIS, HX OF 02/01/2008  . Vitamin D deficiency 07/03/2007  . Osteoporosis of lumbar spine 06/13/2007  . Fatigue 06/13/2007  . HELICOBACTER PYLORI INFECTION 06/06/2006  . Hyperlipidemia 06/06/2006  . Generalized anxiety disorder 06/06/2006  . Adjustment disorder with mixed  anxiety and depressed mood 06/06/2006  . MACULAR DEGENERATION 06/06/2006  . Cerebral artery occlusion with cerebral infarction ([place]) 06/06/2006  . OSTEOARTHRITIS 06/06/2006  . PALPITATIONS 05/25/2006   Past Medical History:  Diagnosis Date  . Alternating constipation and diarrhea   . Anxiety state, unspecified   . Complication of anesthesia    slow to wake up with surgery several years ago   . Depressive disorder, not elsewhere classified   . Diaphragmatic hernia without mention of obstruction or gangrene   . Difficulty sleeping   . Diverticulosis of colon (without mention of hemorrhage)   . Esophageal reflux   . External hemorrhoids without mention of complication   . Fatigue   . Frequency of urination   . Helicobacter pylori (H. pylori)   . Hiatal hernia   . History of transfusion   . Lumbar spondylosis   . Macular degeneration (senile) of retina, unspecified   . Osteoarthrosis, unspecified whether generalized or localized, unspecified site 08-11-11   hx. "rhematoid arthritis", osteoarthritis, DDD, bursitis(hip)  . Osteoporosis   . Palpitations   . PONV (postoperative nausea and vomiting)   . Schatzki's ring   . Shortness of breath 08-11-11   with exertion only at present  . Unspecified cerebral artery occlusion with cerebral infarction 08-11-11   '97/ '06( TIA)-affected lt. side, slight weakness L side  . Unspecified vitamin D deficiency    Past Surgical History:  Procedure Laterality Date  . ABDOMINAL HYSTERECTOMY  08-11-11  . APPENDECTOMY    . BACK SURGERY  08-11-11   '11-hx. lumbar fusion with retained hardware  . CATARACT EXTRACTION  08-11-11   Bilateral  . ESOPHAGEAL DILATION    . KNEE SURGERY  08-11-11   rt. knee scope  . PATELLAR TENDON REPAIR Left 02/22/2014   Procedure: PATELLA TENDON REPAIR;  Surgeon: Gearlean Alf, MD;  Location: [facility];  Service: Orthopedics;  Laterality: Left;  . PATELLAR TENDON REPAIR Left 06/25/2014   Procedure: LEFT PATELLA TENDON REPAIR;   Surgeon: Gaynelle Arabian, MD;  Location: [facility];  Service: Orthopedics;  Laterality: Left;  . TOTAL KNEE ARTHROPLASTY  08/16/2011   Procedure: TOTAL KNEE ARTHROPLASTY;  Surgeon: Gearlean Alf, MD;  Location: [facility];  Service: Orthopedics;  Laterality: Right;  . TOTAL KNEE ARTHROPLASTY Left 01/27/2014   Procedure: LEFT TOTAL KNEE ARTHROPLASTY;  Surgeon: Gearlean Alf, MD;  Location: [facility];  Service: Orthopedics;  Laterality: Left;  . TUBAL LIGATION     Social History   Tobacco Use  . Smoking status: Never Smoker  . Smokeless tobacco: Never Used  Substance Use Topics  . Alcohol use: No    Alcohol/week:  0.0 oz  . Drug use: No   Family History  Problem Relation Age of Onset  . Heart disease Father   . Coronary artery disease Brother   . Diabetes Brother   . Heart disease Brother   . Pancreatic cancer Daughter   . Diabetes Sister   . Colon cancer Neg Hx   . Colon polyps Neg Hx   . Esophageal cancer Neg Hx   . Gallbladder disease Neg Hx    Allergies  Allergen Reactions  . Alendronate Sodium     REACTION: JAW PAIN  . Alendronate Sodium Other (See Comments)    REACTION: JAW PAIN  . Celecoxib Other (See Comments)    REACTION: GI UPSET REACTION: GI UPSET  . Nexium [Esomeprazole Magnesium] Nausea And Vomiting    Pt cannot take the generic Does fine with DAW Pt cannot take the generic Does fine with DAW  . Omeprazole Nausea Only  . Protonix [Pantoprazole Sodium] Nausea And Vomiting  . Rabeprazole Nausea And Vomiting  . Latex Itching and Rash   Current Outpatient Medications on File Prior to Visit  Medication Sig Dispense Refill  . acetaminophen (TYLENOL) 325 MG tablet Take 2 tablets (650 mg total) by mouth every 6 (six) hours as needed for mild pain (or Fever >/= 101). 40 tablet 0  . ALPRAZolam (XANAX) 0.5 MG tablet Take 1 tablet (0.5 mg total) by mouth at bedtime as needed. for sleep 90 tablet 1  . calcium carbonate (TUMS - DOSED IN MG ELEMENTAL CALCIUM) 500 MG chewable  tablet Chew 1 tablet (200 mg of elemental calcium total) by mouth 4 (four) times daily as needed for indigestion or heartburn. 10 tablet 0  . diclofenac sodium (VOLTAREN) 1 % GEL Apply 1 application topically 2 (two) times daily.    . fluticasone (FLONASE) 50 MCG/ACT nasal spray Place 1-2 sprays into both nostrils daily as needed for allergies or rhinitis.    Marland Kitchen HYDROcodone-acetaminophen (NORCO) 10-325 MG tablet Take 1 tablet by mouth every 6 (six) hours as needed. pain  0  . methocarbamol (ROBAXIN) 500 MG tablet Take 1 tablet (500 mg total) by mouth every 6 (six) hours as needed for muscle spasms. 80 tablet 0  . Multiple Vitamins-Minerals (PRESERVISION AREDS PO) Take 1 capsule by mouth 2 (two) times daily.    Vladimir Faster Glycol-Propyl Glycol (SYSTANE OP) Apply 1-2 drops to eye at bedtime as needed (dry eyes.).    Marland Kitchen TOPROL XL 25 MG 24 hr tablet TAKE ONE-HALF (1/2) TABLET AT BEDTIME 45 tablet 2  . furosemide (LASIX) 20 MG tablet Take 1 tablet (20 mg total) by mouth daily. 90 tablet 3   No current facility-administered medications on file prior to visit.      Review of Systems  Constitutional: Positive for fatigue. Negative for activity change, appetite change, fever and unexpected weight change.  HENT: Negative for congestion, ear pain, rhinorrhea, sinus pressure and sore throat.   Eyes: Negative for pain, redness and visual disturbance.  Respiratory: Negative for cough, shortness of breath and wheezing.   Cardiovascular: Negative for chest pain and palpitations.  Gastrointestinal: Negative for abdominal pain, blood in stool, constipation and diarrhea.       Pos for GI symptoms if she does not take her nexium  Endocrine: Negative for polydipsia and polyuria.  Genitourinary: Negative for dysuria, frequency and urgency.  Musculoskeletal: Positive for arthralgias, back pain and gait problem. Negative for myalgias.  Skin: Negative for pallor and rash.  Allergic/Immunologic: Negative for  environmental  allergies.  Neurological: Negative for dizziness, syncope, light-headedness, numbness and headaches.  Hematological: Negative for adenopathy. Does not bruise/bleed easily.  Psychiatric/Behavioral: Positive for decreased concentration and dysphoric mood. Negative for suicidal ideas. The patient is nervous/anxious.        Objective:   Physical Exam  Constitutional: She appears well-developed and well-nourished. No distress.  obese and well appearing   HENT:  Head: Normocephalic and atraumatic.  Right Ear: External ear normal.  Left Ear: External ear normal.  Mouth/Throat: Oropharynx is clear and moist.  Eyes: Conjunctivae and EOM are normal. Pupils are equal, round, and reactive to light. No scleral icterus.  Neck: Normal range of motion. Neck supple. No JVD present. Carotid bruit is not present. No thyromegaly present.  Cardiovascular: Normal rate, regular rhythm, normal heart sounds and intact distal pulses. Exam reveals no gallop.  Pulmonary/Chest: Effort normal and breath sounds normal. No respiratory distress. She has no wheezes. She exhibits no tenderness.  Abdominal: Soft. Bowel sounds are normal. She exhibits no distension, no abdominal bruit and no mass. There is no tenderness.  Genitourinary: No breast swelling, tenderness, discharge or bleeding.  Genitourinary Comments: Breast exam: No mass, nodules, thickening, tenderness, bulging, retraction, inflamation, nipple discharge or skin changes noted.  No axillary or clavicular LA.      Musculoskeletal: Normal range of motion. She exhibits no edema or tenderness.  Mild kyphosis  Changes of OA in extremities  Poor rom TS and LS    Lymphadenopathy:    She has no cervical adenopathy.  Neurological: She is alert. She has normal reflexes. No cranial nerve deficit. She exhibits normal muscle tone. Coordination normal.  Skin: Skin is warm and dry. No rash noted. No erythema. No pallor.  sks scattered Solar lentigines  diffusely     Psychiatric: She has a normal mood and affect.  Pt voices grief  She has good insight Pleasant and talkative           Assessment & Plan:   Problem List Items Addressed This Visit      Digestive   GERD    Insurance no longer pays for DAW nexium  Generic refilled  Will update if problems or not effective       Relevant Medications   NEXIUM 40 MG capsule     Musculoskeletal and Integument   Osteoporosis of lumbar spine    Due for dexa  Ordered  Disc need for calcium/ vitamin D/ wt bearing exercise and bone density test every 2 y to monitor Disc safety/ fracture risk in detail   Completed course of evista  No falls or fx         Other   Adjustment disorder with mixed anxiety and depressed mood - Primary    More problems with grief lately - lost her husband this year  Continues zoloft high dose 200 mg daily which is helpful  She is looking into grief and group counseling  Wants to get out more as well  Uses xanax prn for sleep=disc poss of habit and warning of sedation/falls        Colon cancer screening    Neg cologuard 1/18      Estrogen deficiency   Relevant Orders   DG Bone Density   Generalized anxiety disorder    With grief/depression Continues xanax for sleep Disc self care/ exercise as tol  Trying to get out more        Relevant Medications   sertraline (ZOLOFT) 100 MG tablet  Hyperlipidemia    Disc goals for lipids and reasons to control them Rev labs with pt Rev low sat fat diet in detail Controlled with statin and diet       Relevant Medications   atorvastatin (LIPITOR) 10 MG tablet   Obesity    Discussed how this problem influences overall health and the risks it imposes  Reviewed plan for weight loss with lower calorie diet (via better food choices and also portion control or program like weight watchers) and exercise building up to or more than 30 minutes 5 days per week including some aerobic activity          Prediabetes    Lab Results  Component Value Date   HGBA1C 6.2 02/01/2017   disc imp of low glycemic diet and wt loss to prevent DM2  Handouts given       Vitamin D deficiency    Vitamin D level is therapeutic with current supplementation Disc importance of this to bone and overall health

## 2017-02-08 NOTE — Progress Notes (Signed)
PCP notes:   Health maintenance:  No gaps identified.   Abnormal screenings:   Depression score: 18 Depression screen [facility] 2/9 02/08/2017 12/14/2015 06/04/2014 09/19/2012  Decreased Interest 3 0 0 0  Down, Depressed, Hopeless 3 3 0 0  PHQ - 2 Score 6 3 0 0  Altered sleeping 3 3 - -  Tired, decreased energy 3 3 - -  Change in appetite 3 2 - -  Feeling bad or failure about yourself  1 0 - -  Trouble concentrating 2 1 - -  Moving slowly or fidgety/restless 0 0 - -  Suicidal thoughts 0 0 - -  PHQ-9 Score 18 12 - -  Difficult doing work/chores Very difficult Very difficult - -   Hearing - failed  Hearing Screening   125Hz  250Hz  500Hz  1000Hz  2000Hz  3000Hz  4000Hz  6000Hz  8000Hz   Right ear:   0 40 40  0    Left ear:   40 0 40  0     Patient concerns:   Pt verbalized concerns with grief, identity crisis, financial matters, and back pain.   Pt wants to discuss Nexium with PCP at next appt.   Nurse concerns:  None  Next PCP appt:   02/08/17 @ 1400  I reviewed health advisor's note, was available for consultation, and agree with documentation and plan. Loura Pardon MD

## 2017-02-08 NOTE — Patient Instructions (Addendum)
You are pre diabetic  Try to get most of your carbohydrates from produce (with the exception of white potatoes)  Eat less bread/pasta/rice/snack foods/cereals/sweets and other items from the middle of the grocery store (processed carbs)   Work hard on getting rid of sweet tea   Take care of yourself   When you need rest/ get rest  Stay as active as your body will let you

## 2017-02-08 NOTE — Patient Instructions (Signed)
[name] [name] , Thank you for taking time to come for your Medicare Wellness Visit. I appreciate your ongoing commitment to your health goals. Please review the following plan we discussed and let me know if I can assist you in the future.   These are the goals we discussed: Goals    . Follow up with Primary Care Provider     Starting 02/08/2017, I will continue to take medications as prescribed and to keep appointments with PCP as scheduled.       This is a list of the screening recommended for you and due dates:  Health Maintenance  Topic Date Due  . Tetanus Vaccine  06/12/2017  . Mammogram  06/24/2017  . Flu Shot  Completed  . DEXA scan (bone density measurement)  Completed  . Pneumonia vaccines  Completed   Preventive Care for Adults  A healthy lifestyle and preventive care can promote health and wellness. Preventive health guidelines for adults include the following key practices.  . A routine yearly physical is a good way to check with your health care provider about your health and preventive screening. It is a chance to share any concerns and updates on your health and to receive a thorough exam.  . Visit your dentist for a routine exam and preventive care every 6 months. Brush your teeth twice a day and floss once a day. Good oral hygiene prevents tooth decay and gum disease.  . The frequency of eye exams is based on your age, health, family medical history, use  of contact lenses, and other factors. Follow your health care provider's recommendations for frequency of eye exams.  . Eat a healthy diet. Foods like vegetables, fruits, whole grains, low-fat dairy products, and lean protein foods contain the nutrients you need without too many calories. Decrease your intake of foods high in solid fats, added sugars, and salt. Eat the right amount of calories for you. Get information about a proper diet from your health care provider, if necessary.  . Regular physical exercise is  one of the most important things you can do for your health. Most adults should get at least 150 minutes of moderate-intensity exercise (any activity that increases your heart rate and causes you to sweat) each week. In addition, most adults need muscle-strengthening exercises on 2 or more days a week.  Silver Sneakers may be a benefit available to you. To determine eligibility, you may visit the website: www.silversneakers.com or contact program at (774) 334-0626 Mon-Fri between 8AM-8PM.   . Maintain a healthy weight. The body mass index (BMI) is a screening tool to identify possible weight problems. It provides an estimate of body fat based on height and weight. Your health care provider can find your BMI and can help you achieve or maintain a healthy weight.   For adults 20 years and older: ? A BMI below 18.5 is considered underweight. ? A BMI of 18.5 to 24.9 is normal. ? A BMI of 25 to 29.9 is considered overweight. ? A BMI of 30 and above is considered obese.   . Maintain normal blood lipids and cholesterol levels by exercising and minimizing your intake of saturated fat. Eat a balanced diet with plenty of fruit and vegetables. Blood tests for lipids and cholesterol should begin at age 10 and be repeated every 5 years. If your lipid or cholesterol levels are high, you are over 50, or you are at high risk for heart disease, you may need your cholesterol levels checked more  frequently. Ongoing high lipid and cholesterol levels should be treated with medicines if diet and exercise are not working.  . If you smoke, find out from your health care provider how to quit. If you do not use tobacco, please do not start.  . If you choose to drink alcohol, please do not consume more than 2 drinks per day. One drink is considered to be 12 ounces (355 mL) of beer, 5 ounces (148 mL) of wine, or 1.5 ounces (44 mL) of liquor.  . If you are 35-6 years old, ask your health care provider if you should take  aspirin to prevent strokes.  . Use sunscreen. Apply sunscreen liberally and repeatedly throughout the day. You should seek shade when your shadow is shorter than you. Protect yourself by wearing long sleeves, pants, a wide-brimmed hat, and sunglasses year round, whenever you are outdoors.  . Once a month, do a whole body skin exam, using a mirror to look at the skin on your back. Tell your health care provider of new moles, moles that have irregular borders, moles that are larger than a pencil eraser, or moles that have changed in shape or color.

## 2017-02-08 NOTE — Progress Notes (Signed)
Subjective:   [name] [name] is a 83 y.o. female who presents for Medicare Annual (Subsequent) preventive examination.  Review of Systems:  N/A Cardiac Risk Factors include: advanced age (>42men, >57 women);obesity (BMI >30kg/m2);dyslipidemia     Objective:     Vitals: BP 118/70 (BP Location: Right Arm, Patient Position: Sitting, Cuff Size: Normal)   Pulse 73   Temp 97.8 F (36.6 C) (Oral)   Ht 5' 3.25" (1.607 m)   Wt 193 lb 8 oz (87.8 kg)   SpO2 97%   BMI 34.01 kg/m   Body mass index is 34.01 kg/m.  Advanced Directives 02/08/2017 12/14/2015 07/01/2014 06/25/2014 06/25/2014 06/23/2014 02/21/2014  Does Patient Have a Medical Advance Directive? Yes Yes Yes No - No No  Type of Paramedic of [place];Living will [place];Living will [place];Living will - - - -  Does patient want to make changes to medical advance directive? - - No - Patient declined No - Patient declined No - Patient declined No - Patient declined -  Copy of [place] in Chart? No - copy requested No - copy requested No - copy requested - - - -  Would patient like information on creating a medical advance directive? - - - No - patient declined information - - -  Pre-existing out of facility DNR order (yellow form or pink MOST form) - - - - - - -    Tobacco Social History   Tobacco Use  Smoking Status Never Smoker  Smokeless Tobacco Never Used     Counseling given: No   Clinical Intake:  Pre-visit preparation completed: Yes  Pain : No/denies pain Pain Score: 9      Nutritional Status: BMI of 19-24  Normal Nutritional Risks: None Diabetes: No  How often do you need to have someone help you when you read instructions, pamphlets, or other written materials from your doctor or pharmacy?: 1 - Never What is the last grade level you completed in school?: Associate degree  Interpreter Needed?: No  Comments: pt is a widow and  lives alone Information entered by :: LPinson, LPN  Past Medical History:  Diagnosis Date  . Alternating constipation and diarrhea   . Anxiety state, unspecified   . Complication of anesthesia    slow to wake up with surgery several years ago   . Depressive disorder, not elsewhere classified   . Diaphragmatic hernia without mention of obstruction or gangrene   . Difficulty sleeping   . Diverticulosis of colon (without mention of hemorrhage)   . Esophageal reflux   . External hemorrhoids without mention of complication   . Fatigue   . Frequency of urination   . Helicobacter pylori (H. pylori)   . Hiatal hernia   . History of transfusion   . Lumbar spondylosis   . Macular degeneration (senile) of retina, unspecified   . Osteoarthrosis, unspecified whether generalized or localized, unspecified site 08-11-11   hx. "rhematoid arthritis", osteoarthritis, DDD, bursitis(hip)  . Osteoporosis   . Palpitations   . PONV (postoperative nausea and vomiting)   . Schatzki's ring   . Shortness of breath 08-11-11   with exertion only at present  . Unspecified cerebral artery occlusion with cerebral infarction 08-11-11   '97/ '06( TIA)-affected lt. side, slight weakness L side  . Unspecified vitamin D deficiency    Past Surgical History:  Procedure Laterality Date  . ABDOMINAL HYSTERECTOMY  08-11-11  . APPENDECTOMY    .  BACK SURGERY  08-11-11   '11-hx. lumbar fusion with retained hardware  . CATARACT EXTRACTION  08-11-11   Bilateral  . ESOPHAGEAL DILATION    . KNEE SURGERY  08-11-11   rt. knee scope  . PATELLAR TENDON REPAIR Left 02/22/2014   Procedure: PATELLA TENDON REPAIR;  Surgeon: Gearlean Alf, MD;  Location: [facility];  Service: Orthopedics;  Laterality: Left;  . PATELLAR TENDON REPAIR Left 06/25/2014   Procedure: LEFT PATELLA TENDON REPAIR;  Surgeon: Gaynelle Arabian, MD;  Location: [facility];  Service: Orthopedics;  Laterality: Left;  . TOTAL KNEE ARTHROPLASTY  08/16/2011   Procedure: TOTAL KNEE  ARTHROPLASTY;  Surgeon: Gearlean Alf, MD;  Location: [facility];  Service: Orthopedics;  Laterality: Right;  . TOTAL KNEE ARTHROPLASTY Left 01/27/2014   Procedure: LEFT TOTAL KNEE ARTHROPLASTY;  Surgeon: Gearlean Alf, MD;  Location: [facility];  Service: Orthopedics;  Laterality: Left;  . TUBAL LIGATION     Family History  Problem Relation Age of Onset  . Heart disease Father   . Coronary artery disease Brother   . Diabetes Brother   . Heart disease Brother   . Pancreatic cancer Daughter   . Diabetes Sister   . Colon cancer Neg Hx   . Colon polyps Neg Hx   . Esophageal cancer Neg Hx   . Gallbladder disease Neg Hx    Social History   Socioeconomic History  . Marital status: Legally Separated    Spouse name: None  . Number of children: 3  . Years of education: None  . Highest education level: None  Social Needs  . Financial resource strain: None  . Food insecurity - worry: None  . Food insecurity - inability: None  . Transportation needs - medical: None  . Transportation needs - non-medical: None  Occupational History  . Occupation: disabled    Employer: RETIRED  Tobacco Use  . Smoking status: Never Smoker  . Smokeless tobacco: Never Used  Substance and Sexual Activity  . Alcohol use: No    Alcohol/week: 0.0 oz  . Drug use: No  . Sexual activity: No  Other Topics Concern  . None  Social History Narrative  . None    Outpatient Encounter Medications as of 02/08/2017  Medication Sig  . acetaminophen (TYLENOL) 325 MG tablet Take 2 tablets (650 mg total) by mouth every 6 (six) hours as needed for mild pain (or Fever >/= 101).  Marland Kitchen ALPRAZolam (XANAX) 0.5 MG tablet Take 1 tablet (0.5 mg total) by mouth at bedtime as needed. for sleep  . atorvastatin (LIPITOR) 10 MG tablet TAKE 1 TABLET DAILY  . calcium carbonate (TUMS - DOSED IN MG ELEMENTAL CALCIUM) 500 MG chewable tablet Chew 1 tablet (200 mg of elemental calcium total) by mouth 4 (four) times daily as needed for indigestion  or heartburn.  . clopidogrel (PLAVIX) 75 MG tablet Take 1 tablet (75 mg total) by mouth daily.  . diclofenac sodium (VOLTAREN) 1 % GEL Apply 1 application topically 2 (two) times daily.  . fluticasone (FLONASE) 50 MCG/ACT nasal spray Place 1-2 sprays into both nostrils daily as needed for allergies or rhinitis.  Marland Kitchen HYDROcodone-acetaminophen (NORCO) 10-325 MG tablet Take 1 tablet by mouth every 6 (six) hours as needed. pain  . methocarbamol (ROBAXIN) 500 MG tablet Take 1 tablet (500 mg total) by mouth every 6 (six) hours as needed for muscle spasms.  . Multiple Vitamins-Minerals (PRESERVISION AREDS PO) Take 1 capsule by mouth 2 (two) times daily.  Marland Kitchen  NEXIUM 40 MG capsule Take 1 capsule (40 mg total) by mouth 2 (two) times daily.  Vladimir Faster Glycol-Propyl Glycol (SYSTANE OP) Apply 1-2 drops to eye at bedtime as needed (dry eyes.).  Marland Kitchen sertraline (ZOLOFT) 100 MG tablet TAKE 2 TABLETS DAILY  . TOPROL XL 25 MG 24 hr tablet TAKE ONE-HALF (1/2) TABLET AT BEDTIME  . furosemide (LASIX) 20 MG tablet Take 1 tablet (20 mg total) by mouth daily.   No facility-administered encounter medications on file as of 02/08/2017.     Activities of Daily Living In your present state of health, do you have any difficulty performing the following activities: 02/08/2017  Hearing? N  Vision? N  Difficulty concentrating or making decisions? Y  Walking or climbing stairs? Y  Dressing or bathing? N  Doing errands, shopping? N  Preparing Food and eating ? N  Using the Toilet? N  In the past six months, have you accidently leaked urine? Y  Comment wears pad at night only  Do you have problems with loss of bowel control? N  Managing your Medications? N  Managing your Finances? N  Housekeeping or managing your Housekeeping? Y  Comment due to schedule and generalized weakness, pt has housekeeper clean home once weekly  Some recent data might be hidden    Patient Care Team: Tower, Wynelle Fanny, MD as PCP - Cyndia Diver, MD as Consulting Physician (Cardiology) Calvert Cantor, MD as Consulting Physician (Ophthalmology) Glenna Fellows, MD as Attending Physician (Neurosurgery) Daryll Brod, MD as Consulting Physician (Orthopedic Surgery) Gaynelle Arabian, MD as Consulting Physician (Orthopedic Surgery) Brand Males, MD as Consulting Physician (Pulmonary Disease) Perlie Gold, DDS as Referring Physician (Dentistry)    Assessment:   This is a routine wellness examination for [name] [name].   Hearing Screening   125Hz  250Hz  500Hz  1000Hz  2000Hz  3000Hz  4000Hz  6000Hz  8000Hz   Right ear:   0 40 40  0    Left ear:   40 0 40  0    Vision Screening Comments: Last vision exam in Feb 2018 with Dr. Bing Plume    Exercise Activities and Dietary recommendations Current Exercise Habits: Home exercise routine, Type of exercise: strength training/weights, Time (Minutes): 15, Frequency (Times/Week): 7, Weekly Exercise (Minutes/Week): 105, Exercise limited by: None identified  Goals    . Follow up with Primary Care Provider     Starting 02/08/2017, I will continue to take medications as prescribed and to keep appointments with PCP as scheduled.       Fall Risk Fall Risk  02/08/2017 12/14/2015 09/15/2015 06/04/2014 09/19/2012  Falls in the past year? No No Yes Yes No  Comment - - Emmi Telephone Survey: data to providers prior to load - -  Number falls in past yr: - - 1 1 -  Comment - - Emmi Telephone Survey Actual Response = 1 - -  Injury with Fall? - - Yes Yes -  Risk for fall due to : - - - Impaired mobility;Impaired balance/gait -   Depression Screen PHQ 2/9 Scores 02/08/2017 12/14/2015 06/04/2014 09/19/2012  PHQ - 2 Score 6 3 0 0  PHQ- 9 Score 18 12 - -     Cognitive Function MMSE - Mini Mental State Exam 02/08/2017 12/14/2015  Orientation to time 5 5  Orientation to Place 5 5  Registration 3 3  Attention/ Calculation 0 0  Recall 3 3  Language- name 2 objects 0 0  Language- repeat 1 1  Language- follow 3 step  command 3 3  Language- read & follow direction 0 0  Write a sentence 0 0  Copy design 0 0  Total score 20 20     PLEASE NOTE: A Mini-Cog screen was completed. Maximum score is 20. A value of 0 denotes this part of [place] MMSE was not completed or the patient failed this part of the Mini-Cog screening.   Mini-Cog Screening Orientation to Time - Max 5 pts Orientation to Place - Max 5 pts Registration - Max 3 pts Recall - Max 3 pts Language Repeat - Max 1 pts Language Follow 3 Step Command - Max 3 pts     Immunization History  Administered Date(s) Administered  . Influenza Split 10/17/2011  . Influenza Whole 10/24/2003, 10/19/2006, 10/15/2007, 11/15/2008, 10/17/2009  . Influenza, High Dose Seasonal PF 10/25/2016  . Influenza,inj,Quad PF,6+ Mos 09/19/2012, 11/26/2013  . Influenza-Unspecified 11/10/2014, 09/18/2015  . Pneumococcal Conjugate-13 05/07/2013  . Pneumococcal Polysaccharide-23 01/03/2002, 06/13/2007  . Td 10/09/1996, 06/13/2007  . Zoster 03/21/2013    Screening Tests Health Maintenance  Topic Date Due  . TETANUS/TDAP  06/12/2017  . MAMMOGRAM  06/24/2017  . INFLUENZA VACCINE  Completed  . DEXA SCAN  Completed  . PNA vac Low Risk Adult  Completed       Plan:     I have personally reviewed, addressed, and noted the following in the patient's chart:  A. Medical and social history B. Use of alcohol, tobacco or illicit drugs  C. Current medications and supplements D. Functional ability and status E.  Nutritional status F.  Physical activity G. Advance directives H. List of other physicians I.  Hospitalizations, surgeries, and ER visits in previous 12 months J.  [place] to include hearing, vision, cognitive, depression L. Referrals and appointments - none  In addition, I have reviewed and discussed with patient certain preventive protocols, quality metrics, and best practice recommendations. A written personalized care plan for preventive  services as well as general preventive health recommendations were provided to patient.  See attached scanned questionnaire for additional information.   Signed,   Lindell Noe, MHA, BS, LPN Health Coach

## 2017-02-09 NOTE — Assessment & Plan Note (Signed)
More problems with grief lately - lost her husband this year  Continues zoloft high dose 200 mg daily which is helpful  She is looking into grief and group counseling  Wants to get out more as well  Uses xanax prn for sleep=disc poss of habit and warning of sedation/falls

## 2017-02-09 NOTE — Assessment & Plan Note (Signed)
With grief/depression Continues xanax for sleep Disc self care/ exercise as tol  Trying to get out more

## 2017-02-09 NOTE — Assessment & Plan Note (Signed)
Discussed how this problem influences overall health and the risks it imposes  Reviewed plan for weight loss with lower calorie diet (via better food choices and also portion control or program like weight watchers) and exercise building up to or more than 30 minutes 5 days per week including some aerobic activity    

## 2017-02-09 NOTE — Assessment & Plan Note (Signed)
Insurance no longer pays for DAW nexium  Generic refilled  Will update if problems or not effective

## 2017-02-09 NOTE — Assessment & Plan Note (Signed)
Neg cologuard 1/18

## 2017-02-09 NOTE — Assessment & Plan Note (Signed)
Lab Results  Component Value Date   HGBA1C 6.2 02/01/2017   disc imp of low glycemic diet and wt loss to prevent DM2  Handouts given

## 2017-02-09 NOTE — Assessment & Plan Note (Signed)
Vitamin D level is therapeutic with current supplementation Disc importance of this to bone and overall health  

## 2017-02-09 NOTE — Assessment & Plan Note (Signed)
Disc goals for lipids and reasons to control them Rev labs with pt Rev low sat fat diet in detail Controlled with statin and diet  

## 2017-02-09 NOTE — Assessment & Plan Note (Signed)
Due for dexa  Ordered  Disc need for calcium/ vitamin D/ wt bearing exercise and bone density test every 2 y to monitor Disc safety/ fracture risk in detail   Completed course of evista  No falls or fx

## 2017-02-27 ENCOUNTER — Ambulatory Visit (INDEPENDENT_AMBULATORY_CARE_PROVIDER_SITE_OTHER)
Admission: RE | Admit: 2017-02-27 | Discharge: 2017-02-27 | Disposition: A | Discharge status: Home / Self Care | Payer: Medicare Other | Source: Ambulatory Visit | Attending: Family Medicine | Admitting: Family Medicine

## 2017-02-27 DIAGNOSIS — E2839 Other primary ovarian failure: Secondary | ICD-10-CM

## 2017-03-08 ENCOUNTER — Encounter: Payer: Self-pay | Admitting: *Deleted

## 2017-03-23 DIAGNOSIS — D4981 Neoplasm of unspecified behavior of retina and choroid: Secondary | ICD-10-CM | POA: Diagnosis not present

## 2017-03-23 DIAGNOSIS — H353132 Nonexudative age-related macular degeneration, bilateral, intermediate dry stage: Secondary | ICD-10-CM | POA: Diagnosis not present

## 2017-03-23 DIAGNOSIS — H04123 Dry eye syndrome of bilateral lacrimal glands: Secondary | ICD-10-CM | POA: Diagnosis not present

## 2017-03-23 DIAGNOSIS — H524 Presbyopia: Secondary | ICD-10-CM | POA: Diagnosis not present

## 2017-03-23 DIAGNOSIS — H43813 Vitreous degeneration, bilateral: Secondary | ICD-10-CM | POA: Diagnosis not present

## 2017-04-22 DIAGNOSIS — H353132 Nonexudative age-related macular degeneration, bilateral, intermediate dry stage: Secondary | ICD-10-CM | POA: Diagnosis not present

## 2017-04-25 ENCOUNTER — Telehealth: Payer: Self-pay | Admitting: Family Medicine

## 2017-04-25 NOTE — Telephone Encounter (Signed)
Copied from [place]. Topic: Quick Communication - Rx Refill/Question >> Apr 25, 2017  4:29 PM Selinda Flavin B, NT wrote: Medication: NEXIUM 40 MG capsule Has the patient contacted their pharmacy? Yes.   (Agent: If no, request that the patient contact the pharmacy for the refill.) Preferred Pharmacy (with phone number or street name): [facility], [place] [phone] (Phone) [phone] (Fax)     Agent: Please be advised that RX refills may take up to 3 business days. We ask that you follow-up with your pharmacy.

## 2017-04-26 MED ORDER — NEXIUM 40 MG PO CPDR: 40.0000 mg | DELAYED_RELEASE_CAPSULE | Freq: Two times a day (BID) | ORAL | 1 refills | Status: DC

## 2017-04-26 NOTE — Telephone Encounter (Signed)
Rx sent to mail order

## 2017-04-26 NOTE — Telephone Encounter (Signed)
Attempted to contact pt to let her know that the nexium she is requesting, that there are refills at [facility] on [address] No answer, left message for her to call the office back  with any questions.

## 2017-04-26 NOTE — Telephone Encounter (Signed)
Pt called back, she said that she is no longer able to get medication covered if she picks up at local pharmacy. It must be sent by mail order.  cb  is [phone]

## 2017-05-03 ENCOUNTER — Telehealth: Payer: Self-pay | Admitting: *Deleted

## 2017-05-03 NOTE — Telephone Encounter (Signed)
Received fax saying that we have to do a PA for pt to receive name brand Nexium. Called and spoke directly with [facility] and since only reaction to the generic is Nausea and vomiting they denied the request to get name brand nexium, they said that it doesn't meet medical necessity to get name brand and the FDA has said that the generic is equivalent to the name brand so PA was denied.   They will send pt a fax and fax the denial letter to [place] as well. I will give it to Dr. Glori Bickers once we receive it

## 2017-05-03 NOTE — Telephone Encounter (Signed)
Please let pt know if she does not already  Does she need [place] to send in generic?

## 2017-05-03 NOTE — Telephone Encounter (Signed)
Letter received and placed in Dr. Marliss Coots inbox for review

## 2017-05-04 MED ORDER — ESOMEPRAZOLE MAGNESIUM 40 MG PO CPDR: 40.0000 mg | DELAYED_RELEASE_CAPSULE | Freq: Two times a day (BID) | ORAL | 3 refills | Status: DC

## 2017-05-04 NOTE — Telephone Encounter (Signed)
Pt notified and [facility] sent [facility] pt will update [place]

## 2017-05-15 ENCOUNTER — Other Ambulatory Visit: Payer: Self-pay | Admitting: Family Medicine

## 2017-05-15 DIAGNOSIS — Z1231 Encounter for screening mammogram for malignant neoplasm of breast: Secondary | ICD-10-CM

## 2017-05-22 DIAGNOSIS — H353132 Nonexudative age-related macular degeneration, bilateral, intermediate dry stage: Secondary | ICD-10-CM | POA: Diagnosis not present

## 2017-06-02 ENCOUNTER — Encounter: Payer: Self-pay | Admitting: Cardiovascular Disease

## 2017-06-03 ENCOUNTER — Other Ambulatory Visit: Payer: Self-pay | Admitting: Cardiovascular Disease

## 2017-06-05 ENCOUNTER — Other Ambulatory Visit: Payer: Self-pay | Admitting: Cardiovascular Disease

## 2017-06-05 MED ORDER — METOPROLOL SUCCINATE ER 25 MG PO TB24: ORAL_TABLET | ORAL | 0 refills | Status: DC

## 2017-06-05 NOTE — Telephone Encounter (Signed)
Pt's medication was sent to pt's pharmacy as requested. Confirmation received.  °

## 2017-06-21 DIAGNOSIS — H353132 Nonexudative age-related macular degeneration, bilateral, intermediate dry stage: Secondary | ICD-10-CM | POA: Diagnosis not present

## 2017-06-23 ENCOUNTER — Encounter: Payer: Self-pay | Admitting: Cardiovascular Disease

## 2017-06-23 ENCOUNTER — Ambulatory Visit (INDEPENDENT_AMBULATORY_CARE_PROVIDER_SITE_OTHER): Payer: Medicare Other | Admitting: Cardiovascular Disease

## 2017-06-23 VITALS — BP 124/78 | HR 63 | Ht 65.6 in | Wt 194.0 lb

## 2017-06-23 DIAGNOSIS — I5032 Chronic diastolic (congestive) heart failure: Secondary | ICD-10-CM

## 2017-06-23 DIAGNOSIS — R0602 Shortness of breath: Secondary | ICD-10-CM

## 2017-06-23 DIAGNOSIS — I6350 Cerebral infarction due to unspecified occlusion or stenosis of unspecified cerebral artery: Secondary | ICD-10-CM | POA: Diagnosis not present

## 2017-06-23 DIAGNOSIS — R002 Palpitations: Secondary | ICD-10-CM | POA: Diagnosis not present

## 2017-06-23 MED ORDER — FUROSEMIDE 20 MG PO TABS: 20.0000 mg | ORAL_TABLET | ORAL | 3 refills | Status: DC

## 2017-06-23 NOTE — Patient Instructions (Addendum)
Medication Instructions:  1) Start taking LASIX 3 times a week  Labwork: You will have labs when you return for your echocardiogram.  Testing/Procedures: Your provider has requested that you have an echocardiogram. Your echo is scheduled July 03, 2017. Please arrive by 11:00AM. Echocardiography is a painless test that uses sound waves to create images of your heart. It provides your doctor with information about the size and shape of your heart and how well your heart's chambers and valves are working. This procedure takes approximately one hour. There are no restrictions for this procedure.  Follow-Up: Your provider wants you to follow-up in: 1 year with Dr. Burt Knack. You will receive a reminder letter in the mail two months in advance. If you don't receive a letter, please call our office to schedule the follow-up appointment.    Any Other Special Instructions Will Be Listed Below (If Applicable).     If you need a refill on your cardiac medications before your next appointment, please call your pharmacy.

## 2017-06-23 NOTE — Progress Notes (Signed)
Cardiology Office Note Date:  06/23/2017   ID:  [name] [name], DOB Jan 30, 1934, MRN [id number]  PCP:  Abner Greenspan, MD  Cardiologist:  Sherren Mocha, MD    Chief Complaint  Patient presents with  . Shortness of Breath     History of Present Illness: [name] [name] is a 83 y.o. female who presents for follow-up of heart palpitations.  She is been treated with clopidogrel long-term after a TIA many years ago.  Her last stress test in 2017 was low risk.  An echocardiogram demonstrated normal LV systolic function with grade 2 diastolic dysfunction.  Her palpitations have been controlled with metoprolol succinate.  She is here alone today. Her husband passed away from pulmonary fibrosis approximately 3 months ago. She's had a tough time but says she 'just keeps going.' Her son and daughter both live locally.  Patient complains of progressive shortness of breath with activity.  She is short of breath with walking just 8 to 10 feet.  She ambulates with a walker because of chronic back and arthritic related problems.  She is gained about 10 pounds and attributes some of her problems to weight gain.  She denies chest pain or pressure.  She denies lightheadedness or syncope.  She does admit to orthopnea, but denies PND.   Past Medical History:  Diagnosis Date  . Alternating constipation and diarrhea   . Anxiety state, unspecified   . Complication of anesthesia    slow to wake up with surgery several years ago   . Depressive disorder, not elsewhere classified   . Diaphragmatic hernia without mention of obstruction or gangrene   . Difficulty sleeping   . Diverticulosis of colon (without mention of hemorrhage)   . Esophageal reflux   . External hemorrhoids without mention of complication   . Fatigue   . Frequency of urination   . Helicobacter pylori (H. pylori)   . Hiatal hernia   . History of transfusion   . Lumbar spondylosis   . Macular degeneration (senile) of retina,  unspecified   . Osteoarthrosis, unspecified whether generalized or localized, unspecified site 08-11-11   hx. "rhematoid arthritis", osteoarthritis, DDD, bursitis(hip)  . Osteoporosis   . Palpitations   . PONV (postoperative nausea and vomiting)   . Schatzki's ring   . Shortness of breath 08-11-11   with exertion only at present  . Unspecified cerebral artery occlusion with cerebral infarction 08-11-11   '97/ '06( TIA)-affected lt. side, slight weakness L side  . Unspecified vitamin D deficiency     Past Surgical History:  Procedure Laterality Date  . ABDOMINAL HYSTERECTOMY  08-11-11  . APPENDECTOMY    . BACK SURGERY  08-11-11   '11-hx. lumbar fusion with retained hardware  . CATARACT EXTRACTION  08-11-11   Bilateral  . ESOPHAGEAL DILATION    . KNEE SURGERY  08-11-11   rt. knee scope  . PATELLAR TENDON REPAIR Left 02/22/2014   Procedure: PATELLA TENDON REPAIR;  Surgeon: Gearlean Alf, MD;  Location: [facility];  Service: Orthopedics;  Laterality: Left;  . PATELLAR TENDON REPAIR Left 06/25/2014   Procedure: LEFT PATELLA TENDON REPAIR;  Surgeon: Gaynelle Arabian, MD;  Location: [facility];  Service: Orthopedics;  Laterality: Left;  . TOTAL KNEE ARTHROPLASTY  08/16/2011   Procedure: TOTAL KNEE ARTHROPLASTY;  Surgeon: Gearlean Alf, MD;  Location: [facility];  Service: Orthopedics;  Laterality: Right;  . TOTAL KNEE ARTHROPLASTY Left 01/27/2014   Procedure: LEFT TOTAL KNEE ARTHROPLASTY;  Surgeon: Pilar Plate  Zella Ball, MD;  Location: [facility];  Service: Orthopedics;  Laterality: Left;  . TUBAL LIGATION      Current Outpatient Medications  Medication Sig Dispense Refill  . acetaminophen (TYLENOL) 325 MG tablet Take 2 tablets (650 mg total) by mouth every 6 (six) hours as needed for mild pain (or Fever >/= 101). 40 tablet 0  . ALPRAZolam (XANAX) 0.5 MG tablet Take 1 tablet (0.5 mg total) by mouth at bedtime as needed. for sleep 90 tablet 1  . atorvastatin (LIPITOR) 10 MG tablet Take 1 tablet (10 mg total) by mouth  daily. 90 tablet 3  . calcium carbonate (TUMS - DOSED IN MG ELEMENTAL CALCIUM) 500 MG chewable tablet Chew 1 tablet (200 mg of elemental calcium total) by mouth 4 (four) times daily as needed for indigestion or heartburn. 10 tablet 0  . clopidogrel (PLAVIX) 75 MG tablet Take 1 tablet (75 mg total) by mouth daily. 90 tablet 3  . diclofenac sodium (VOLTAREN) 1 % GEL Apply 1 application topically 2 (two) times daily.    Marland Kitchen esomeprazole (NEXIUM) 40 MG capsule Take 1 capsule (40 mg total) by mouth 2 (two) times daily before a meal. 180 capsule 3  . fluticasone (FLONASE) 50 MCG/ACT nasal spray Place 1-2 sprays into both nostrils daily as needed for allergies or rhinitis.    Marland Kitchen HYDROcodone-acetaminophen (NORCO) 10-325 MG tablet Take 1 tablet by mouth every 6 (six) hours as needed. pain  0  . methocarbamol (ROBAXIN) 500 MG tablet Take 1 tablet (500 mg total) by mouth every 6 (six) hours as needed for muscle spasms. 80 tablet 0  . metoprolol succinate (TOPROL XL) 25 MG 24 hr tablet TAKE ONE-HALF (1/2) TABLET AT BEDTIME 45 tablet 0  . Multiple Vitamins-Minerals (PRESERVISION AREDS PO) Take 1 capsule by mouth 2 (two) times daily.    Vladimir Faster Glycol-Propyl Glycol (SYSTANE OP) Apply 1-2 drops to eye at bedtime as needed (dry eyes.).    Marland Kitchen sertraline (ZOLOFT) 100 MG tablet Take 2 tablets (200 mg total) by mouth daily. 180 tablet 3   No current facility-administered medications for this visit.     Allergies:   Alendronate sodium; Alendronate sodium; Celecoxib; Nexium [esomeprazole magnesium]; Omeprazole; Protonix [pantoprazole sodium]; Rabeprazole; and Latex   Social History:  The patient  reports that she has never smoked. She has never used smokeless tobacco. She reports that she does not drink alcohol or use drugs.   Family History:  The patient's  family history includes Coronary artery disease in her brother; Diabetes in her brother and sister; Heart disease in her brother and father; Pancreatic cancer in  her daughter.    ROS:  Please see the history of present illness.  Otherwise, review of systems is positive for weight gain, leg swelling, macular degeneration with vision loss, cough, depression, back pain, muscle pain, fatigue, anxiety, joint swelling.  All other systems are reviewed and negative.    PHYSICAL EXAM: VS:  BP 124/78   Pulse 63   Ht 5' 5.6" (1.666 m)   Wt 194 lb (88 kg)   SpO2 94%   BMI 31.70 kg/m  , BMI Body mass index is 31.7 kg/m. GEN: pleasant elderly woman, overweight, in no acute distress  HEENT: normal  Neck: no JVD, no masses. No carotid bruits Cardiac: RRR without murmur or gallop                Respiratory:  clear to auscultation bilaterally, normal work of breathing GI: soft, nontender, nondistended, +  BS MS: no deformity or atrophy  Ext: trace pretibial edema, pedal pulses 2+= bilaterally Skin: warm and dry, no rash Neuro:  Strength and sensation are intact Psych: euthymic mood, full affect  EKG:  EKG is ordered today. The ekg ordered today shows normal sinus 63 bpm, within normal limits.  Recent Labs: 02/01/2017: ALT 12; BUN 15; Creatinine, Ser 0.89; Hemoglobin 13.3; Platelets 302.0; Potassium 4.6; Sodium 140; TSH 3.09   Lipid Panel     Component Value Date/Time   CHOL 164 02/01/2017 1039   TRIG 101.0 02/01/2017 1039   HDL 75.80 02/01/2017 1039   CHOLHDL 2 02/01/2017 1039   VLDL 20.2 02/01/2017 1039   LDLCALC 68 02/01/2017 1039      Wt Readings from Last 3 Encounters:  06/23/17 194 lb (88 kg)  02/08/17 193 lb 8 oz (87.8 kg)  02/08/17 193 lb 8 oz (87.8 kg)     Cardiac Studies Reviewed: Myocardial Perfusion Study 06-24-2015: Study Highlights    Nuclear stress EF: 65%.  There was no ST segment deviation noted during stress.  There is a small defect of mild severity present in the basal inferoseptal and mid inferoseptal location. The defect is non-reversible with no ischemia noted.  There is a small defect of moderate severity  present in the apical lateral and apex location. The defect is non-reversible and consistent with breast attenuation and diaphragatic attenuation artifact. No ischemia noted.  This is a low risk study.  The left ventricular ejection fraction is normal (55-65%).    Echo 06-24-2015: Study Conclusions  - Left ventricle: The cavity size was normal. Wall thickness was normal. Systolic function was normal. The estimated ejection fraction was in the range of 60% to 65%. Wall motion was normal; there were no regional wall motion abnormalities. Features are consistent with a pseudonormal left ventricular filling pattern, with concomitant abnormal relaxation and increased filling pressure (grade 2 diastolic dysfunction). - Aortic valve: Sclerosis without stenosis. There was trivial regurgitation. - Mitral valve: There was trivial regurgitation. - Left atrium: The atrium was moderately dilated. - Right ventricle: The cavity size was normal. Systolic function was normal. - Tricuspid valve: Peak RV-RA gradient (S): 26 mm Hg. - Pulmonary arteries: PA peak pressure: 29 mm Hg (S). - Inferior vena cava: The vessel was normal in size. The respirophasic diameter changes were in the normal range (= 50%), consistent with normal central venous pressure.  Impressions:  - Normal LV size with EF 60-65%. Moderate diastolic dysfunction. Normal RV size and systolic function. Aortic sclerosis without significant stenosis.  ASSESSMENT AND PLAN: 1.  Heart palpitations: well-controlled on metoprolol succinate.  2.  TIA, remote: Continues on clopidogrel  3.  Diastolic dysfunction: Patient with symptoms of progressive shortness of breath and orthopnea suggestive of diastolic heart failure.  We will check a metabolic panel and BNP, update an echocardiogram, and start furosemide 20 mg 3 days/week.  Discussed lifestyle modification and need for weight loss.  She has advanced arthritis  and exercise is not really feasible for her.  We discussed portion control and dietary modification.  Current medicines are reviewed with the patient today.  The patient does not have concerns regarding medicines.  Labs/ tests ordered today include:   Orders Placed This Encounter  Procedures  . EKG 12-Lead  . ECHOCARDIOGRAM COMPLETE    Disposition:   FU one year  Signed, Sherren Mocha, MD  06/23/2017 5:10 PM    [address], [place], [place]  [address]  Phone: [phone]; Fax: [phone]

## 2017-06-28 ENCOUNTER — Ambulatory Visit
Admission: RE | Admit: 2017-06-28 | Discharge: 2017-06-28 | Disposition: A | Discharge status: Home / Self Care | Payer: Medicare Other | Source: Ambulatory Visit | Attending: Family Medicine | Admitting: Family Medicine

## 2017-06-28 DIAGNOSIS — Z1231 Encounter for screening mammogram for malignant neoplasm of breast: Secondary | ICD-10-CM

## 2017-06-29 ENCOUNTER — Encounter: Payer: Self-pay | Admitting: *Deleted

## 2017-07-03 ENCOUNTER — Ambulatory Visit (HOSPITAL_COMMUNITY): Payer: Medicare Other | Attending: Cardiovascular Disease

## 2017-07-03 ENCOUNTER — Other Ambulatory Visit: Payer: Medicare Other | Admitting: *Deleted

## 2017-07-03 ENCOUNTER — Other Ambulatory Visit: Payer: Self-pay

## 2017-07-03 DIAGNOSIS — I5032 Chronic diastolic (congestive) heart failure: Secondary | ICD-10-CM

## 2017-07-03 DIAGNOSIS — E785 Hyperlipidemia, unspecified: Secondary | ICD-10-CM | POA: Diagnosis not present

## 2017-07-03 DIAGNOSIS — R0600 Dyspnea, unspecified: Secondary | ICD-10-CM | POA: Diagnosis not present

## 2017-07-03 DIAGNOSIS — I119 Hypertensive heart disease without heart failure: Secondary | ICD-10-CM | POA: Diagnosis not present

## 2017-07-03 DIAGNOSIS — I272 Pulmonary hypertension, unspecified: Secondary | ICD-10-CM | POA: Diagnosis not present

## 2017-07-03 DIAGNOSIS — R0602 Shortness of breath: Secondary | ICD-10-CM | POA: Diagnosis not present

## 2017-07-03 DIAGNOSIS — I080 Rheumatic disorders of both mitral and aortic valves: Secondary | ICD-10-CM | POA: Insufficient documentation

## 2017-07-03 LAB — BASIC METABOLIC PANEL
BUN / CREAT RATIO: 16 (ref 12–28)
BUN: 13 mg/dL (ref 8–27)
CO2: 22 mmol/L (ref 20–29)
Calcium: 9.3 mg/dL (ref 8.7–10.3)
Chloride: 107 mmol/L — ABNORMAL HIGH (ref 96–106)
Creatinine, Ser: 0.81 mg/dL (ref 0.57–1.00)
GFR calc non Af Amer: 69 mL/min/{1.73_m2} (ref >59)
GFR, EST AFRICAN AMERICAN: 79 mL/min/{1.73_m2} (ref >59)
GLUCOSE: 91 mg/dL (ref 65–99)
Potassium: 4.5 mmol/L (ref 3.5–5.2)
SODIUM: 144 mmol/L (ref 134–144)

## 2017-07-03 LAB — PRO B NATRIURETIC PEPTIDE: NT-PRO BNP: 437 pg/mL (ref 0–738)

## 2017-07-05 ENCOUNTER — Telehealth: Payer: Self-pay

## 2017-07-05 MED ORDER — FUROSEMIDE 20 MG PO TABS: 20.0000 mg | ORAL_TABLET | Freq: Every day | ORAL | 3 refills | Status: DC

## 2017-07-05 NOTE — Telephone Encounter (Signed)
-----   Message from Sherren Mocha, MD sent at 07/04/2017  6:31 PM EDT ----- Reviewed. Appears to have significant diastolic dysfunction. Would be ok to increase furosemide to once daily dosing if she has gotten some symptomatic benefit from taking it 3 days/week.

## 2017-07-05 NOTE — Telephone Encounter (Signed)
Informed patient of results and verbal understanding expressed.  Encouraged patient to limit salt and keep good blood pressure control. Instructed her to INCREASE LASIX to daily. She states she will probably not take on Sundays when she goes to church. She was grateful for call and agrees with treatment plan.

## 2017-07-06 ENCOUNTER — Telehealth: Payer: Self-pay | Admitting: Cardiovascular Disease

## 2017-07-06 NOTE — Telephone Encounter (Signed)
Release of Information mailed to pt home address.

## 2017-07-12 ENCOUNTER — Telehealth: Payer: Self-pay

## 2017-07-12 NOTE — Telephone Encounter (Signed)
Copied from [place] [id number]. Topic: Inquiry >> Jul 12, 2017  9:01 AM Vernona Rieger wrote: Reason for CRM: Patient is aware that Dr Glori Bickers is out of the office this week and wants the form she just faxed over from her attorney sent to [phone], when she returns back to the office.  Attention:: Mirna Mires

## 2017-07-12 NOTE — Telephone Encounter (Signed)
I will see it when I get back

## 2017-07-21 DIAGNOSIS — H353132 Nonexudative age-related macular degeneration, bilateral, intermediate dry stage: Secondary | ICD-10-CM | POA: Diagnosis not present

## 2017-07-21 NOTE — Telephone Encounter (Signed)
Pt will check with her attorney office on Monday morning. Pt has an appt with her attorney at 4 pm on 07-24-17

## 2017-07-24 NOTE — Telephone Encounter (Signed)
Letter done on Sh[place]'s desk to fax Thanks!

## 2017-07-24 NOTE — Telephone Encounter (Signed)
Please let me know when we get it , thanks

## 2017-07-24 NOTE — Telephone Encounter (Signed)
Patient called back and said that she will have the attorneys office fax again the comp. Form she needs and if Dr. Glori Bickers can please send it back today due to she has an appt today at 4pm.

## 2017-07-24 NOTE — Telephone Encounter (Signed)
Letter faxed and pt notified.

## 2017-07-24 NOTE — Telephone Encounter (Signed)
I do not see the form on my desk/in my pile

## 2017-07-27 DIAGNOSIS — M1712 Unilateral primary osteoarthritis, left knee: Secondary | ICD-10-CM | POA: Diagnosis not present

## 2017-07-27 DIAGNOSIS — Z471 Aftercare following joint replacement surgery: Secondary | ICD-10-CM | POA: Diagnosis not present

## 2017-07-27 DIAGNOSIS — Z96652 Presence of left artificial knee joint: Secondary | ICD-10-CM | POA: Diagnosis not present

## 2017-08-07 ENCOUNTER — Telehealth: Payer: Self-pay | Admitting: Cardiovascular Disease

## 2017-08-07 NOTE — Telephone Encounter (Signed)
Reviewed echo results again. Patient was grateful for call.

## 2017-08-07 NOTE — Telephone Encounter (Signed)
New message    Patient had echocardiogram done on  June 17. Have some questions wants clarification of test results.

## 2017-08-20 DIAGNOSIS — H353132 Nonexudative age-related macular degeneration, bilateral, intermediate dry stage: Secondary | ICD-10-CM | POA: Diagnosis not present

## 2017-09-03 ENCOUNTER — Other Ambulatory Visit: Payer: Self-pay | Admitting: Cardiovascular Disease

## 2017-09-19 DIAGNOSIS — H353132 Nonexudative age-related macular degeneration, bilateral, intermediate dry stage: Secondary | ICD-10-CM | POA: Diagnosis not present

## 2017-09-28 DIAGNOSIS — Z23 Encounter for immunization: Secondary | ICD-10-CM | POA: Diagnosis not present

## 2017-10-19 DIAGNOSIS — H353132 Nonexudative age-related macular degeneration, bilateral, intermediate dry stage: Secondary | ICD-10-CM | POA: Diagnosis not present

## 2017-11-18 DIAGNOSIS — H353132 Nonexudative age-related macular degeneration, bilateral, intermediate dry stage: Secondary | ICD-10-CM | POA: Diagnosis not present

## 2017-12-18 DIAGNOSIS — H353132 Nonexudative age-related macular degeneration, bilateral, intermediate dry stage: Secondary | ICD-10-CM | POA: Diagnosis not present

## 2017-12-25 DIAGNOSIS — M19041 Primary osteoarthritis, right hand: Secondary | ICD-10-CM | POA: Diagnosis not present

## 2017-12-25 DIAGNOSIS — G5601 Carpal tunnel syndrome, right upper limb: Secondary | ICD-10-CM | POA: Diagnosis not present

## 2017-12-25 DIAGNOSIS — M180 Bilateral primary osteoarthritis of first carpometacarpal joints: Secondary | ICD-10-CM | POA: Diagnosis not present

## 2018-01-17 DIAGNOSIS — H353132 Nonexudative age-related macular degeneration, bilateral, intermediate dry stage: Secondary | ICD-10-CM | POA: Diagnosis not present

## 2018-01-21 ENCOUNTER — Other Ambulatory Visit: Payer: Self-pay | Admitting: Family Medicine

## 2018-02-01 DIAGNOSIS — H524 Presbyopia: Secondary | ICD-10-CM | POA: Diagnosis not present

## 2018-02-01 DIAGNOSIS — H43813 Vitreous degeneration, bilateral: Secondary | ICD-10-CM | POA: Diagnosis not present

## 2018-02-01 DIAGNOSIS — D4981 Neoplasm of unspecified behavior of retina and choroid: Secondary | ICD-10-CM | POA: Diagnosis not present

## 2018-02-01 DIAGNOSIS — H04123 Dry eye syndrome of bilateral lacrimal glands: Secondary | ICD-10-CM | POA: Diagnosis not present

## 2018-02-01 DIAGNOSIS — H353132 Nonexudative age-related macular degeneration, bilateral, intermediate dry stage: Secondary | ICD-10-CM | POA: Diagnosis not present

## 2018-02-02 ENCOUNTER — Telehealth: Payer: Self-pay | Admitting: Family Medicine

## 2018-02-02 DIAGNOSIS — M818 Other osteoporosis without current pathological fracture: Principal | ICD-10-CM

## 2018-02-02 DIAGNOSIS — Z8673 Personal history of transient ischemic attack (TIA), and cerebral infarction without residual deficits: Secondary | ICD-10-CM

## 2018-02-02 DIAGNOSIS — E559 Vitamin D deficiency, unspecified: Secondary | ICD-10-CM

## 2018-02-02 DIAGNOSIS — M810 Age-related osteoporosis without current pathological fracture: Secondary | ICD-10-CM

## 2018-02-02 DIAGNOSIS — R5382 Chronic fatigue, unspecified: Secondary | ICD-10-CM

## 2018-02-02 DIAGNOSIS — E7800 Pure hypercholesterolemia, unspecified: Secondary | ICD-10-CM

## 2018-02-02 DIAGNOSIS — R7303 Prediabetes: Secondary | ICD-10-CM

## 2018-02-02 NOTE — Telephone Encounter (Signed)
-----   Message from Lendon Collar, RT sent at 01/29/2018  9:18 AM EST ----- Regarding: Lab orders for Tuesday 02/06/18 Please enter CPE lab orders for 02/06/18. Thanks!

## 2018-02-06 ENCOUNTER — Other Ambulatory Visit: Payer: Medicare Other

## 2018-02-06 DIAGNOSIS — H353123 Nonexudative age-related macular degeneration, left eye, advanced atrophic without subfoveal involvement: Secondary | ICD-10-CM | POA: Diagnosis not present

## 2018-02-06 DIAGNOSIS — D3132 Benign neoplasm of left choroid: Secondary | ICD-10-CM | POA: Diagnosis not present

## 2018-02-06 DIAGNOSIS — H353112 Nonexudative age-related macular degeneration, right eye, intermediate dry stage: Secondary | ICD-10-CM | POA: Diagnosis not present

## 2018-02-06 DIAGNOSIS — H43813 Vitreous degeneration, bilateral: Secondary | ICD-10-CM | POA: Diagnosis not present

## 2018-02-08 ENCOUNTER — Other Ambulatory Visit: Payer: Self-pay | Admitting: Family Medicine

## 2018-02-09 ENCOUNTER — Ambulatory Visit (INDEPENDENT_AMBULATORY_CARE_PROVIDER_SITE_OTHER): Payer: Medicare Other | Admitting: Family Medicine

## 2018-02-09 ENCOUNTER — Ambulatory Visit (INDEPENDENT_AMBULATORY_CARE_PROVIDER_SITE_OTHER): Payer: Medicare Other

## 2018-02-09 ENCOUNTER — Encounter: Payer: Self-pay | Admitting: Family Medicine

## 2018-02-09 ENCOUNTER — Ambulatory Visit: Payer: Medicare Other

## 2018-02-09 ENCOUNTER — Telehealth: Payer: Self-pay | Admitting: Cardiovascular Disease

## 2018-02-09 VITALS — BP 90/60 | HR 73 | Temp 97.3°F | Ht 64.0 in | Wt 191.5 lb

## 2018-02-09 DIAGNOSIS — Z8673 Personal history of transient ischemic attack (TIA), and cerebral infarction without residual deficits: Secondary | ICD-10-CM

## 2018-02-09 DIAGNOSIS — R0602 Shortness of breath: Secondary | ICD-10-CM

## 2018-02-09 DIAGNOSIS — E7800 Pure hypercholesterolemia, unspecified: Secondary | ICD-10-CM | POA: Diagnosis not present

## 2018-02-09 DIAGNOSIS — F4323 Adjustment disorder with mixed anxiety and depressed mood: Secondary | ICD-10-CM | POA: Diagnosis not present

## 2018-02-09 DIAGNOSIS — R7303 Prediabetes: Secondary | ICD-10-CM

## 2018-02-09 DIAGNOSIS — M818 Other osteoporosis without current pathological fracture: Secondary | ICD-10-CM | POA: Diagnosis not present

## 2018-02-09 DIAGNOSIS — E6609 Other obesity due to excess calories: Secondary | ICD-10-CM

## 2018-02-09 DIAGNOSIS — Z0000 Encounter for general adult medical examination without abnormal findings: Secondary | ICD-10-CM | POA: Diagnosis not present

## 2018-02-09 DIAGNOSIS — M5136 Other intervertebral disc degeneration, lumbar region: Secondary | ICD-10-CM

## 2018-02-09 DIAGNOSIS — I5032 Chronic diastolic (congestive) heart failure: Secondary | ICD-10-CM | POA: Diagnosis not present

## 2018-02-09 DIAGNOSIS — R5382 Chronic fatigue, unspecified: Secondary | ICD-10-CM

## 2018-02-09 DIAGNOSIS — E559 Vitamin D deficiency, unspecified: Secondary | ICD-10-CM | POA: Diagnosis not present

## 2018-02-09 DIAGNOSIS — M810 Age-related osteoporosis without current pathological fracture: Secondary | ICD-10-CM

## 2018-02-09 DIAGNOSIS — Z6832 Body mass index (BMI) 32.0-32.9, adult: Secondary | ICD-10-CM | POA: Diagnosis not present

## 2018-02-09 LAB — CBC WITH DIFFERENTIAL/PLATELET
Basophils Absolute: 0.1 10*3/uL (ref 0.0–0.1)
Basophils Relative: 1.2 % (ref 0.0–3.0)
EOS ABS: 0.1 10*3/uL (ref 0.0–0.7)
Eosinophils Relative: 2.5 % (ref 0.0–5.0)
HCT: 43.3 % (ref 36.0–46.0)
Hemoglobin: 14.3 g/dL (ref 12.0–15.0)
Lymphocytes Relative: 33.5 % (ref 12.0–46.0)
Lymphs Abs: 1.8 10*3/uL (ref 0.7–4.0)
MCHC: 32.9 g/dL (ref 30.0–36.0)
MCV: 88.2 fl (ref 78.0–100.0)
Monocytes Absolute: 0.5 10*3/uL (ref 0.1–1.0)
Monocytes Relative: 9 % (ref 3.0–12.0)
Neutro Abs: 2.9 10*3/uL (ref 1.4–7.7)
Neutrophils Relative %: 53.8 % (ref 43.0–77.0)
Platelets: 254 10*3/uL (ref 150.0–400.0)
RBC: 4.91 Mil/uL (ref 3.87–5.11)
RDW: 14.1 % (ref 11.5–15.5)
WBC: 5.5 10*3/uL (ref 4.0–10.5)

## 2018-02-09 LAB — COMPREHENSIVE METABOLIC PANEL
ALBUMIN: 4.4 g/dL (ref 3.5–5.2)
ALT: 15 U/L (ref 0–35)
AST: 21 U/L (ref 0–37)
Alkaline Phosphatase: 78 U/L (ref 39–117)
BUN: 22 mg/dL (ref 6–23)
CO2: 29 mEq/L (ref 19–32)
CREATININE: 0.92 mg/dL (ref 0.40–1.20)
Calcium: 9.7 mg/dL (ref 8.4–10.5)
Chloride: 103 mEq/L (ref 96–112)
GFR: 58.58 mL/min — ABNORMAL LOW (ref >60.00)
Glucose, Bld: 109 mg/dL — ABNORMAL HIGH (ref 70–99)
Potassium: 4.3 mEq/L (ref 3.5–5.1)
Sodium: 140 mEq/L (ref 135–145)
Total Bilirubin: 0.5 mg/dL (ref 0.2–1.2)
Total Protein: 7.3 g/dL (ref 6.0–8.3)

## 2018-02-09 LAB — LIPID PANEL
Cholesterol: 187 mg/dL (ref 0–200)
HDL: 72.4 mg/dL (ref >39.00)
LDL Cholesterol: 91 mg/dL (ref 0–99)
NonHDL: 114.22
Total CHOL/HDL Ratio: 3
Triglycerides: 115 mg/dL (ref 0.0–149.0)
VLDL: 23 mg/dL (ref 0.0–40.0)

## 2018-02-09 LAB — VITAMIN D 25 HYDROXY (VIT D DEFICIENCY, FRACTURES): VITD: 55.18 ng/mL (ref 30.00–100.00)

## 2018-02-09 LAB — HEMOGLOBIN A1C: Hgb A1c MFr Bld: 5.9 % (ref 4.6–6.5)

## 2018-02-09 LAB — TSH: TSH: 2.22 u[IU]/mL (ref 0.35–4.50)

## 2018-02-09 MED ORDER — SERTRALINE HCL 100 MG PO TABS: 200.0000 mg | ORAL_TABLET | Freq: Every day | ORAL | 3 refills | Status: DC

## 2018-02-09 MED ORDER — ATORVASTATIN CALCIUM 10 MG PO TABS: 10.0000 mg | ORAL_TABLET | Freq: Every day | ORAL | 3 refills | Status: DC

## 2018-02-09 MED ORDER — ESOMEPRAZOLE MAGNESIUM 40 MG PO CPDR: 40.0000 mg | DELAYED_RELEASE_CAPSULE | Freq: Two times a day (BID) | ORAL | 3 refills | Status: DC

## 2018-02-09 NOTE — Telephone Encounter (Signed)
New Message       Patient went in for her yearly today at [facility] (her primary)and was complaining of SOB ,[place] office is on the phone, pls advise.

## 2018-02-09 NOTE — Patient Instructions (Addendum)
Let me know when or if you are ready to do some counseling for anxiety   If you are interested in the new shingles vaccine (Shingrix) - call your local pharmacy to check on coverage and availability  If affordable, get on a wait list at your pharmacy to get the vaccine.   I referred you for follow up about cardiology and also orthopedics - our office will call you regarding that   We will see how labs look

## 2018-02-09 NOTE — Progress Notes (Signed)
Subjective:   [name] [name] is a 84 y.o. female who presents for Medicare Annual (Subsequent) preventive examination.  Review of Systems:  N/A Cardiac Risk Factors include: advanced age (>54men, >77 women);obesity (BMI >30kg/m2);dyslipidemia     Objective:     Vitals: BP 90/60 (BP Location: Right Arm, Patient Position: Sitting, Cuff Size: Normal)   Pulse 73   Temp (!) 97.3 F (36.3 C) (Oral)   Ht 5\' 4"  (1.626 m) Comment: shoes  Wt 191 lb 8 oz (86.9 kg)   SpO2 94%   BMI 32.87 kg/m   Body mass index is 32.87 kg/m.  Advanced Directives 02/09/2018 02/08/2017 12/14/2015 07/01/2014 06/25/2014 06/25/2014 06/23/2014  Does Patient Have a Medical Advance Directive? Yes Yes Yes Yes No - No  Type of Paramedic of [place];Living will [place];Living will [place];Living will [place];Living will - - -  Does patient want to make changes to medical advance directive? - - - No - Patient declined No - Patient declined No - Patient declined No - Patient declined  Copy of [place] in Chart? No - copy requested No - copy requested No - copy requested No - copy requested - - -  Would patient like information on creating a medical advance directive? - - - - No - patient declined information - -  Pre-existing out of facility DNR order (yellow form or pink MOST form) - - - - - - -    Tobacco Social History   Tobacco Use  Smoking Status Never Smoker  Smokeless Tobacco Never Used     Counseling given: No   Clinical Intake:  Pre-visit preparation completed: Yes  Pain : No/denies pain Pain Score: 6      Nutritional Status: BMI > 30  Obese Nutritional Risks: None  How often do you need to have someone help you when you read instructions, pamphlets, or other written materials from your doctor or pharmacy?: 1 - Never What is the last grade level you completed in school?: Associate  degree  Interpreter Needed?: No  Comments: pt is a widow and lives alone Information entered by :: LPinson, LPN  Past Medical History:  Diagnosis Date  . Alternating constipation and diarrhea   . Anxiety state, unspecified   . Complication of anesthesia    slow to wake up with surgery several years ago   . Depressive disorder, not elsewhere classified   . Diaphragmatic hernia without mention of obstruction or gangrene   . Difficulty sleeping   . Diverticulosis of colon (without mention of hemorrhage)   . Esophageal reflux   . External hemorrhoids without mention of complication   . Fatigue   . Frequency of urination   . Helicobacter pylori (H. pylori)   . Hiatal hernia   . History of transfusion   . Lumbar spondylosis   . Macular degeneration (senile) of retina, unspecified   . Osteoarthrosis, unspecified whether generalized or localized, unspecified site 08-11-11   hx. "rhematoid arthritis", osteoarthritis, DDD, bursitis(hip)  . Osteoporosis   . Palpitations   . PONV (postoperative nausea and vomiting)   . Schatzki's ring   . Shortness of breath 08-11-11   with exertion only at present  . Unspecified cerebral artery occlusion with cerebral infarction 08-11-11   '97/ '06( TIA)-affected lt. side, slight weakness L side  . Unspecified vitamin D deficiency    Past Surgical History:  Procedure Laterality Date  . ABDOMINAL HYSTERECTOMY  08-11-11  . APPENDECTOMY    . BACK SURGERY  08-11-11   '11-hx. lumbar fusion with retained hardware  . CATARACT EXTRACTION  08-11-11   Bilateral  . ESOPHAGEAL DILATION    . KNEE SURGERY  08-11-11   rt. knee scope  . PATELLAR TENDON REPAIR Left 02/22/2014   Procedure: PATELLA TENDON REPAIR;  Surgeon: Gearlean Alf, MD;  Location: [facility];  Service: Orthopedics;  Laterality: Left;  . PATELLAR TENDON REPAIR Left 06/25/2014   Procedure: LEFT PATELLA TENDON REPAIR;  Surgeon: Gaynelle Arabian, MD;  Location: [facility];  Service: Orthopedics;  Laterality:  Left;  . TOTAL KNEE ARTHROPLASTY  08/16/2011   Procedure: TOTAL KNEE ARTHROPLASTY;  Surgeon: Gearlean Alf, MD;  Location: [facility];  Service: Orthopedics;  Laterality: Right;  . TOTAL KNEE ARTHROPLASTY Left 01/27/2014   Procedure: LEFT TOTAL KNEE ARTHROPLASTY;  Surgeon: Gearlean Alf, MD;  Location: [facility];  Service: Orthopedics;  Laterality: Left;  . TUBAL LIGATION     Family History  Problem Relation Age of Onset  . Heart disease Father   . Coronary artery disease Brother   . Diabetes Brother   . Heart disease Brother   . Pancreatic cancer Daughter   . Diabetes Sister   . Colon cancer Neg Hx   . Colon polyps Neg Hx   . Esophageal cancer Neg Hx   . Gallbladder disease Neg Hx    Social History   Socioeconomic History  . Marital status: Legally Separated    Spouse name: Not on file  . Number of children: 3  . Years of education: Not on file  . Highest education level: Not on file  Occupational History  . Occupation: disabled    Employer: RETIRED  Social Needs  . Financial resource strain: Not on file  . Food insecurity:    Worry: Not on file    Inability: Not on file  . Transportation needs:    Medical: Not on file    Non-medical: Not on file  Tobacco Use  . Smoking status: Never Smoker  . Smokeless tobacco: Never Used  Substance and Sexual Activity  . Alcohol use: No    Alcohol/week: 0.0 standard drinks  . Drug use: No  . Sexual activity: Never  Lifestyle  . Physical activity:    Days per week: Not on file    Minutes per session: Not on file  . Stress: Not on file  Relationships  . Social connections:    Talks on phone: Not on file    Gets together: Not on file    Attends religious service: Not on file    Active member of club or organization: Not on file    Attends meetings of clubs or organizations: Not on file    Relationship status: Not on file  Other Topics Concern  . Not on file  Social History Narrative  . Not on file    Outpatient Encounter  Medications as of 02/09/2018  Medication Sig  . acetaminophen (TYLENOL) 325 MG tablet Take 2 tablets (650 mg total) by mouth every 6 (six) hours as needed for mild pain (or Fever >/= 101).  Marland Kitchen ALPRAZolam (XANAX) 0.5 MG tablet Take 1 tablet (0.5 mg total) by mouth at bedtime as needed. for sleep  . atorvastatin (LIPITOR) 10 MG tablet Take 1 tablet (10 mg total) by mouth daily.  . calcium carbonate (TUMS - DOSED IN MG ELEMENTAL CALCIUM) 500 MG chewable tablet Chew 1 tablet (200 mg of elemental  calcium total) by mouth 4 (four) times daily as needed for indigestion or heartburn.  . clopidogrel (PLAVIX) 75 MG tablet TAKE 1 TABLET DAILY  . diclofenac sodium (VOLTAREN) 1 % GEL Apply 1 application topically 2 (two) times daily.  Marland Kitchen esomeprazole (NEXIUM) 40 MG capsule Take 1 capsule (40 mg total) by mouth 2 (two) times daily before a meal.  . fluticasone (FLONASE) 50 MCG/ACT nasal spray Place 1-2 sprays into both nostrils daily as needed for allergies or rhinitis.  . furosemide (LASIX) 20 MG tablet Take 1 tablet (20 mg total) by mouth daily.  Marland Kitchen HYDROcodone-acetaminophen (NORCO) 10-325 MG tablet Take 1 tablet by mouth every 6 (six) hours as needed. Takes 1/2 tablet as needed  . methocarbamol (ROBAXIN) 500 MG tablet Take 1 tablet (500 mg total) by mouth every 6 (six) hours as needed for muscle spasms. (Patient taking differently: Take 500 mg by mouth every 6 (six) hours as needed for muscle spasms. Takes 1/2 tablet as needed)  . metoprolol succinate (TOPROL XL) 25 MG 24 hr tablet TAKE ONE-HALF (1/2) TABLET AT BEDTIME  . Multiple Vitamins-Minerals (PRESERVISION AREDS PO) Take 1 capsule by mouth 2 (two) times daily.  Vladimir Faster Glycol-Propyl Glycol (SYSTANE OP) Apply 1-2 drops to eye at bedtime as needed (dry eyes.).  Marland Kitchen sertraline (ZOLOFT) 100 MG tablet Take 2 tablets (200 mg total) by mouth daily.   No facility-administered encounter medications on file as of 02/09/2018.     Activities of Daily Living In your  present state of health, do you have any difficulty performing the following activities: 02/09/2018  Hearing? N  Vision? N  Difficulty concentrating or making decisions? Y  Walking or climbing stairs? Y  Dressing or bathing? N  Doing errands, shopping? N  Preparing Food and eating ? N  Using the Toilet? N  In the past six months, have you accidently leaked urine? Y  Do you have problems with loss of bowel control? Y  Managing your Medications? N  Managing your Finances? N  Housekeeping or managing your Housekeeping? Y  Comment housekeeper once monthly  Some recent data might be hidden    Patient Care Team: Tower, Wynelle Fanny, MD as PCP - Cyndia Diver, MD as PCP - Cardiology (Cardiology) Calvert Cantor, MD as Consulting Physician (Ophthalmology) Glenna Fellows, MD as Attending Physician (Neurosurgery) Daryll Brod, MD as Consulting Physician (Orthopedic Surgery) Gaynelle Arabian, MD as Consulting Physician (Orthopedic Surgery) Brand Males, MD as Consulting Physician (Pulmonary Disease) Perlie Gold, DDS as Referring Physician (Dentistry)    Assessment:   This is a routine wellness examination for [name] [name].  Exercise Activities and Dietary recommendations Current Exercise Habits: Home exercise routine, Type of exercise: strength training/weights, Time (Minutes): 15, Frequency (Times/Week): 7, Weekly Exercise (Minutes/Week): 105, Intensity: Mild, Exercise limited by: None identified  Goals    . Follow up with Primary Care Provider     Starting 02/09/2018, I will continue to take medications as prescribed and to keep appointments with PCP as scheduled.       Fall Risk Fall Risk  02/09/2018 02/08/2017 12/14/2015 09/15/2015 06/04/2014  Falls in the past year? 0 No No Yes Yes  Comment - - - Emmi Telephone Survey: data to providers prior to load -  Number falls in past yr: - - - 1 1  Comment - - - Emmi Telephone Survey Actual Response = 1 -  Injury with Fall? - - - Yes Yes   Risk for fall due to : - - - -  Impaired mobility;Impaired balance/gait   Depression Screen PHQ 2/9 Scores 02/09/2018 02/08/2017 12/14/2015 06/04/2014  PHQ - 2 Score 2 6 3  0  PHQ- 9 Score 4 18 12  -     Cognitive Function MMSE - Mini Mental State Exam 02/09/2018 02/08/2017 12/14/2015  Orientation to time 5 5 5   Orientation to Place 5 5 5   Registration 3 3 3   Attention/ Calculation 0 0 0  Recall 3 3 3   Language- name 2 objects 0 0 0  Language- repeat 1 1 1   Language- follow 3 step command 3 3 3   Language- read & follow direction 0 0 0  Write a sentence 0 0 0  Copy design 0 0 0  Total score 20 20 20      PLEASE NOTE: A Mini-Cog screen was completed. Maximum score is 20. A value of 0 denotes this part of [place] MMSE was not completed or the patient failed this part of the Mini-Cog screening.   Mini-Cog Screening Orientation to Time - Max 5 pts Orientation to Place - Max 5 pts Registration - Max 3 pts Recall - Max 3 pts Language Repeat - Max 1 pts Language Follow 3 Step Command - Max 3 pts     Immunization History  Administered Date(s) Administered  . Influenza Split 10/17/2011  . Influenza Whole 10/24/2003, 10/19/2006, 10/15/2007, 11/15/2008, 10/17/2009  . Influenza, High Dose Seasonal PF 10/25/2016, 09/28/2017  . Influenza,inj,Quad PF,6+ Mos 09/19/2012, 11/26/2013  . Influenza-Unspecified 11/10/2014, 09/18/2015  . Pneumococcal Conjugate-13 05/07/2013  . Pneumococcal Polysaccharide-23 01/03/2002, 06/13/2007  . Td 10/09/1996, 06/13/2007  . Zoster 03/21/2013    Screening Tests Health Maintenance  Topic Date Due  . TETANUS/TDAP  01/17/2020 (Originally 06/12/2017)  . MAMMOGRAM  06/29/2018  . INFLUENZA VACCINE  Completed  . DEXA SCAN  Completed  . PNA vac Low Risk Adult  Completed       Plan:   I have personally reviewed, addressed, and noted the following in the patient's chart:  A. Medical and social history B. Use of alcohol, tobacco or illicit drugs  C. Current  medications and supplements D. Functional ability and status E.  Nutritional status F.  Physical activity G. Advance directives H. List of other physicians I.  Hospitalizations, surgeries, and ER visits in previous 12 months J.  [place] to include hearing, vision, cognitive, depression L. Referrals and appointments - none  In addition, I have reviewed and discussed with patient certain preventive protocols, quality metrics, and best practice recommendations. A written personalized care plan for preventive services as well as general preventive health recommendations were provided to patient.  See attached scanned questionnaire for additional information.   Signed,   Lindell Noe, MHA, BS, LPN Health Coach

## 2018-02-09 NOTE — Progress Notes (Signed)
Subjective:    Patient ID: [name] [name], female    DOB: [birth year], 84 y.o.   MRN: [id number]  HPI  Here for annual f/u of chronic health problems    Wt Readings from Last 3 Encounters:  02/09/18 191 lb 8 oz (86.9 kg)  02/09/18 191 lb 8 oz (86.9 kg)  06/23/17 194 lb (88 kg)  still doing her weight lifting for her arms  Down 3 lb since June  32.87 kg/m   AMW visit today  Hearing loss at 500 Hz Does not think she needs a hearing aid   Has dry macular degeneration-working very closely   Colon cancer screening -had neg cologuard in 2018  Mammogram 6/19 Self breast exam-no lumps that she knows   dexa 2/19  Osteopenia of LN - slt improvement  On ca and D  Finished course of evista in the past  Former dexa showed OP of LS- score was no longer in the OP range  Pend vit D level from labs today   Past hx of CHF (diastolic dysfunction)  Takes metoprolol  Lasix -helped the fluid in her legs  She still says she is sob (on exertion)   No wheezing  H/o CVA No symptoms at all  On plavix  BP Readings from Last 3 Encounters:  02/09/18 90/60  02/09/18 90/60  06/23/17 124/78   Pulse Readings from Last 3 Encounters:  02/09/18 73  02/09/18 73  06/23/17 63    Mood - anx/dep PHQ score is 4 (improved) Thinks she is more anxious (she thinks she over reacts at times)- is a control freak  Her breathing problems make her more anxious  Is very careful with her xanax    Notes more issues with IBS  GERD-needs refill of ppi  Zoster status -zostavax 3/15    Energy level - is low in general  Labs are pending   Still working some   Hyperlipidemia Lab Results  Component Value Date   CHOL 164 02/01/2017   HDL 75.80 02/01/2017   LDLCALC 68 02/01/2017   TRIG 101.0 02/01/2017   CHOLHDL 2 02/01/2017   Pending labs from today  Atorvastatin  Stays away from fatty food and fried foods     Prediabetes Lab Results  Component Value Date   HGBA1C 6.2 02/01/2017   labs are pending from today  She is not watching her diet well enough  Ate a lot of sweets at the holidays  Good with fruits and veggies   Patient Active Problem List   Diagnosis Date Noted  . Chronic diastolic heart failure ([place]) 02/09/2018  . Prediabetes 01/26/2017  . Hearing loss 01/19/2016  . Carpal tunnel syndrome 08/31/2015  . Encounter for Medicare annual wellness exam 06/04/2014  . Obesity 06/04/2014  . Estrogen deficiency 06/04/2014  . Colon cancer screening 06/04/2014  . Schatzki's ring 03/27/2014  . Patellar tendon rupture 02/21/2014  . Dysphagia 10/17/2011  . OA (osteoarthritis) of knee 08/16/2011  . Hemorrhoids 08/10/2011  . Other dyspnea and respiratory abnormality 06/21/2010  . Urinary incontinence 05/27/2010  . OVERACTIVE BLADDER 03/12/2010  . Lumbar degenerative disc disease 04/13/2009  . GERD 10/15/2008  . CEREBROVASCULAR ACCIDENT, HX OF 10/15/2008  . ARTHRITIS, GENERALIZED 06/26/2008  . EXTERNAL HEMORRHOIDS 02/01/2008  . HIATAL HERNIA 02/01/2008  . DIVERTICULOSIS OF COLON 02/01/2008  . GASTRITIS, HX OF 02/01/2008  . DYSPNEA 09/19/2007  . Vitamin D deficiency 07/03/2007  . Osteoporosis of lumbar spine 06/13/2007  . Fatigue 06/13/2007  . HELICOBACTER PYLORI  INFECTION 06/06/2006  . Hyperlipidemia 06/06/2006  . Generalized anxiety disorder 06/06/2006  . Adjustment disorder with mixed anxiety and depressed mood 06/06/2006  . MACULAR DEGENERATION 06/06/2006  . H/O: CVA (cerebrovascular accident) 06/06/2006  . OSTEOARTHRITIS 06/06/2006   Past Medical History:  Diagnosis Date  . Alternating constipation and diarrhea   . Anxiety state, unspecified   . Complication of anesthesia    slow to wake up with surgery several years ago   . Depressive disorder, not elsewhere classified   . Diaphragmatic hernia without mention of obstruction or gangrene   . Difficulty sleeping   . Diverticulosis of colon (without mention of hemorrhage)   . Esophageal reflux   .  External hemorrhoids without mention of complication   . Fatigue   . Frequency of urination   . Helicobacter pylori (H. pylori)   . Hiatal hernia   . History of transfusion   . Lumbar spondylosis   . Macular degeneration (senile) of retina, unspecified   . Osteoarthrosis, unspecified whether generalized or localized, unspecified site 08-11-11   hx. "rhematoid arthritis", osteoarthritis, DDD, bursitis(hip)  . Osteoporosis   . Palpitations   . PONV (postoperative nausea and vomiting)   . Schatzki's ring   . Shortness of breath 08-11-11   with exertion only at present  . Unspecified cerebral artery occlusion with cerebral infarction 08-11-11   '97/ '06( TIA)-affected lt. side, slight weakness L side  . Unspecified vitamin D deficiency    Past Surgical History:  Procedure Laterality Date  . ABDOMINAL HYSTERECTOMY  08-11-11  . APPENDECTOMY    . BACK SURGERY  08-11-11   '11-hx. lumbar fusion with retained hardware  . CATARACT EXTRACTION  08-11-11   Bilateral  . ESOPHAGEAL DILATION    . KNEE SURGERY  08-11-11   rt. knee scope  . PATELLAR TENDON REPAIR Left 02/22/2014   Procedure: PATELLA TENDON REPAIR;  Surgeon: Gearlean Alf, MD;  Location: [facility];  Service: Orthopedics;  Laterality: Left;  . PATELLAR TENDON REPAIR Left 06/25/2014   Procedure: LEFT PATELLA TENDON REPAIR;  Surgeon: Gaynelle Arabian, MD;  Location: [facility];  Service: Orthopedics;  Laterality: Left;  . TOTAL KNEE ARTHROPLASTY  08/16/2011   Procedure: TOTAL KNEE ARTHROPLASTY;  Surgeon: Gearlean Alf, MD;  Location: [facility];  Service: Orthopedics;  Laterality: Right;  . TOTAL KNEE ARTHROPLASTY Left 01/27/2014   Procedure: LEFT TOTAL KNEE ARTHROPLASTY;  Surgeon: Gearlean Alf, MD;  Location: [facility];  Service: Orthopedics;  Laterality: Left;  . TUBAL LIGATION     Social History   Tobacco Use  . Smoking status: Never Smoker  . Smokeless tobacco: Never Used  Substance Use Topics  . Alcohol use: No    Alcohol/week: 0.0 standard  drinks  . Drug use: No   Family History  Problem Relation Age of Onset  . Heart disease Father   . Coronary artery disease Brother   . Diabetes Brother   . Heart disease Brother   . Pancreatic cancer Daughter   . Diabetes Sister   . Colon cancer Neg Hx   . Colon polyps Neg Hx   . Esophageal cancer Neg Hx   . Gallbladder disease Neg Hx    Allergies  Allergen Reactions  . Alendronate Sodium     REACTION: JAW PAIN  . Alendronate Sodium Other (See Comments)    REACTION: JAW PAIN  . Celecoxib Other (See Comments)    REACTION: GI UPSET REACTION: GI UPSET  . Nexium [Esomeprazole Magnesium] Nausea And  Vomiting    Pt cannot take the generic Does fine with DAW Pt cannot take the generic Does fine with DAW  . Omeprazole Nausea Only  . Protonix [Pantoprazole Sodium] Nausea And Vomiting  . Rabeprazole Nausea And Vomiting  . Latex Itching and Rash   Current Outpatient Medications on File Prior to Visit  Medication Sig Dispense Refill  . acetaminophen (TYLENOL) 325 MG tablet Take 2 tablets (650 mg total) by mouth every 6 (six) hours as needed for mild pain (or Fever >/= 101). 40 tablet 0  . ALPRAZolam (XANAX) 0.5 MG tablet Take 1 tablet (0.5 mg total) by mouth at bedtime as needed. for sleep 90 tablet 1  . calcium carbonate (TUMS - DOSED IN MG ELEMENTAL CALCIUM) 500 MG chewable tablet Chew 1 tablet (200 mg of elemental calcium total) by mouth 4 (four) times daily as needed for indigestion or heartburn. 10 tablet 0  . clopidogrel (PLAVIX) 75 MG tablet TAKE 1 TABLET DAILY 90 tablet 1  . diclofenac sodium (VOLTAREN) 1 % GEL Apply 1 application topically 2 (two) times daily.    . fluticasone (FLONASE) 50 MCG/ACT nasal spray Place 1-2 sprays into both nostrils daily as needed for allergies or rhinitis.    . furosemide (LASIX) 20 MG tablet Take 1 tablet (20 mg total) by mouth daily. 90 tablet 3  . HYDROcodone-acetaminophen (NORCO) 10-325 MG tablet Take 1 tablet by mouth every 6 (six) hours as  needed. Takes 1/2 tablet as needed  0  . methocarbamol (ROBAXIN) 500 MG tablet Take 1 tablet (500 mg total) by mouth every 6 (six) hours as needed for muscle spasms. (Patient taking differently: Take 500 mg by mouth every 6 (six) hours as needed for muscle spasms. Takes 1/2 tablet as needed) 80 tablet 0  . metoprolol succinate (TOPROL XL) 25 MG 24 hr tablet TAKE ONE-HALF (1/2) TABLET AT BEDTIME 45 tablet 2  . Multiple Vitamins-Minerals (PRESERVISION AREDS PO) Take 1 capsule by mouth 2 (two) times daily.    Vladimir Faster Glycol-Propyl Glycol (SYSTANE OP) Apply 1-2 drops to eye at bedtime as needed (dry eyes.).     No current facility-administered medications on file prior to visit.     Review of Systems  Constitutional: Positive for fatigue. Negative for activity change, appetite change, fever and unexpected weight change.  HENT: Negative for congestion, ear pain, rhinorrhea, sinus pressure and sore throat.   Eyes: Negative for pain, redness and visual disturbance.  Respiratory: Positive for shortness of breath. Negative for cough, chest tightness and wheezing.   Cardiovascular: Negative for chest pain, palpitations and leg swelling.  Gastrointestinal: Negative for abdominal pain, blood in stool, constipation and diarrhea.       IBS  Endocrine: Negative for polydipsia and polyuria.  Genitourinary: Negative for dysuria, frequency and urgency.       No change in overactive bladder   Musculoskeletal: Positive for back pain. Negative for arthralgias and myalgias.  Skin: Negative for pallor and rash.  Allergic/Immunologic: Negative for environmental allergies.  Neurological: Negative for dizziness, syncope and headaches.  Hematological: Negative for adenopathy. Does not bruise/bleed easily.  Psychiatric/Behavioral: Negative for decreased concentration, dysphoric mood, self-injury and sleep disturbance. The patient is nervous/anxious.        Objective:   Physical Exam Constitutional:       General: She is not in acute distress.    Appearance: She is well-developed.  HENT:     Head: Normocephalic and atraumatic.     Right Ear: External ear normal.  Left Ear: External ear normal.  Eyes:     General: No scleral icterus.    Conjunctiva/sclera: Conjunctivae normal.     Pupils: Pupils are equal, round, and reactive to light.  Neck:     Musculoskeletal: Normal range of motion and neck supple.     Thyroid: No thyromegaly.     Vascular: No carotid bruit or JVD.  Cardiovascular:     Rate and Rhythm: Normal rate and regular rhythm.     Pulses: Normal pulses.     Heart sounds: Normal heart sounds. No gallop.   Pulmonary:     Effort: Pulmonary effort is normal. No respiratory distress.     Breath sounds: Normal breath sounds. No wheezing.  Chest:     Chest wall: No tenderness.  Abdominal:     General: Bowel sounds are normal. There is no distension or abdominal bruit.     Palpations: Abdomen is soft. There is no mass.     Tenderness: There is no abdominal tenderness.  Genitourinary:    Comments: Breast exam: No mass, nodules, thickening, tenderness, bulging, retraction, inflamation, nipple discharge or skin changes noted.  No axillary or clavicular LA.     Musculoskeletal:        General: No tenderness.     Right lower leg: No edema.     Left lower leg: No edema.     Comments: Poor rom of LS  Lymphadenopathy:     Cervical: No cervical adenopathy.  Skin:    General: Skin is warm and dry.     Coloration: Skin is not pale.     Findings: No erythema, lesion or rash.     Comments: Solar lentigines diffusely sks also  Neurological:     Mental Status: She is alert. Mental status is at baseline.     Cranial Nerves: No cranial nerve deficit.     Motor: No abnormal muscle tone.     Coordination: Coordination normal.     Deep Tendon Reflexes: Reflexes are normal and symmetric.     Comments: Labored gait from back pain  Psychiatric:        Attention and Perception:  Attention and perception normal.        Mood and Affect: Mood is anxious.        Cognition and Memory: Cognition normal.     Comments: Speaks candidly about stressors            Assessment & Plan:   Problem List Items Addressed This Visit      Cardiovascular and Mediastinum   Chronic diastolic heart failure ([place])    Still significantly sob on exertion - though the lasix helped edema Will schedule early f/u with cardiology       Relevant Medications   atorvastatin (LIPITOR) 10 MG tablet   Other Relevant Orders   Ambulatory referral to Cardiology     Musculoskeletal and Integument   Lumbar degenerative disc disease    Pt has seen Dr Carloyn Manner in the past-he is retiring Needs appt for ongoing care  Was not planning on surgery  Will ref to orthopedics       Relevant Orders   Ambulatory referral to Orthopedic Surgery   Osteoporosis of lumbar spine    Some mild improvement on dexa 2/19  On ca and D S/p course of evista in the past  D level today        Other   Vitamin D deficiency    D level today  Disc  imp to bone and overall health      Hyperlipidemia    Disc goals for lipids and reasons to control them Rev last labs with pt Rev low sat fat diet in detail  Labs today - prev well controlled with statin and diet        Relevant Medications   atorvastatin (LIPITOR) 10 MG tablet   Adjustment disorder with mixed anxiety and depressed mood    Pt c/o more anxiety lately  PHQ score is improved  Sob makes her more anx (? Vice versa also)  Continues high dose sertraline  Also xanax prn with caution  Reviewed stressors/ coping techniques/symptoms/ support sources/ tx options and side effects in detail today  Declines counseling ref       H/O: CVA (cerebrovascular accident)    No further /new symptoms  Continue to control cholesterol and BP Continues plavix      Fatigue    Ongoing  In setting of anx and sob  Also stressors  Labs pending       DYSPNEA -  Primary    Worse of late with h/o DD/ CHF  This interferes significantly with mobility and ADLs Will schedule cardiology f/u      Relevant Orders   Ambulatory referral to Cardiology   Obesity    Discussed how this problem influences overall health and the risks it imposes  Reviewed plan for weight loss with lower calorie diet (via better food choices and also portion control or program like weight watchers) and exercise building up to or more than 30 minutes 5 days per week including some aerobic activity         Prediabetes    A1C today  disc imp of low glycemic diet and wt loss to prevent DM2  Pt says diet has not been good lately

## 2018-02-09 NOTE — Progress Notes (Signed)
PCP notes:   Health maintenance:  Tetanus vaccine - postponed/insurance  Abnormal screenings:   Hearing- failed  Hearing Screening   125Hz  250Hz  500Hz  1000Hz  2000Hz  3000Hz  4000Hz  6000Hz  8000Hz   Right ear:   0 40 40  40    Left ear:   0 40 40  40     Depression score: 4 Depression screen [facility] 2/9 02/09/2018 02/08/2017 12/14/2015 06/04/2014 09/19/2012  Decreased Interest 0 3 0 0 0  Down, Depressed, Hopeless 2 3 3  0 0  PHQ - 2 Score 2 6 3  0 0  Altered sleeping 0 3 3 - -  Tired, decreased energy 0 3 3 - -  Change in appetite 1 3 2  - -  Feeling bad or failure about yourself  1 1 0 - -  Trouble concentrating 0 2 1 - -  Moving slowly or fidgety/restless 0 0 0 - -  Suicidal thoughts 0 0 0 - -  PHQ-9 Score 4 18 12  - -  Difficult doing work/chores Not difficult at all Very difficult Very difficult - -   Patient concerns:   Refill need for Nexium. Please send to [facility].  Increased IBS with diarrhea. Intermittent incontinence.   Increased SOB with right-sided heaviness above breast  Increased anxiety. Wants to discuss if Zoloft is still effective.  Shingles vaccine - should she get the new vaccines?   Nurse concerns:  None  Next PCP appt:   02/09/18 @ 1430  I reviewed health advisor's note, was available for consultation, and agree with documentation and plan. Loura Pardon MD

## 2018-02-09 NOTE — Telephone Encounter (Signed)
[name] PCP called to request an earlier visit with Dr. Burt Knack than June. [name] was seen at her PCP today and the MD noticed shortness of breath when she walked down the hallway and would like Cardiology to see her in the next few weeks. Scheduled [name] 1/29 with Dr. Burt Knack. She was grateful for call and agrees with treatment plan.

## 2018-02-09 NOTE — Patient Instructions (Signed)
[name] [name] , Thank you for taking time to come for your Medicare Wellness Visit. I appreciate your ongoing commitment to your health goals. Please review the following plan we discussed and let me know if I can assist you in the future.   These are the goals we discussed: Goals    . Follow up with Primary Care Provider     Starting 02/09/2018, I will continue to take medications as prescribed and to keep appointments with PCP as scheduled.       This is a list of the screening recommended for you and due dates:  Health Maintenance  Topic Date Due  . Tetanus Vaccine  01/17/2020*  . Mammogram  06/29/2018  . Flu Shot  Completed  . DEXA scan (bone density measurement)  Completed  . Pneumonia vaccines  Completed  *Topic was postponed. The date shown is not the original due date.   Preventive Care for Adults  A healthy lifestyle and preventive care can promote health and wellness. Preventive health guidelines for adults include the following key practices.  . A routine yearly physical is a good way to check with your health care provider about your health and preventive screening. It is a chance to share any concerns and updates on your health and to receive a thorough exam.  . Visit your dentist for a routine exam and preventive care every 6 months. Brush your teeth twice a day and floss once a day. Good oral hygiene prevents tooth decay and gum disease.  . The frequency of eye exams is based on your age, health, family medical history, use  of contact lenses, and other factors. Follow your health care provider's recommendations for frequency of eye exams.  . Eat a healthy diet. Foods like vegetables, fruits, whole grains, low-fat dairy products, and lean protein foods contain the nutrients you need without too many calories. Decrease your intake of foods high in solid fats, added sugars, and salt. Eat the right amount of calories for you. Get information about a proper diet from your  health care provider, if necessary.  . Regular physical exercise is one of the most important things you can do for your health. Most adults should get at least 150 minutes of moderate-intensity exercise (any activity that increases your heart rate and causes you to sweat) each week. In addition, most adults need muscle-strengthening exercises on 2 or more days a week.  Silver Sneakers may be a benefit available to you. To determine eligibility, you may visit the website: www.silversneakers.com or contact program at 813-165-6097 Mon-Fri between 8AM-8PM.   . Maintain a healthy weight. The body mass index (BMI) is a screening tool to identify possible weight problems. It provides an estimate of body fat based on height and weight. Your health care provider can find your BMI and can help you achieve or maintain a healthy weight.   For adults 20 years and older: ? A BMI below 18.5 is considered underweight. ? A BMI of 18.5 to 24.9 is normal. ? A BMI of 25 to 29.9 is considered overweight. ? A BMI of 30 and above is considered obese.   . Maintain normal blood lipids and cholesterol levels by exercising and minimizing your intake of saturated fat. Eat a balanced diet with plenty of fruit and vegetables. Blood tests for lipids and cholesterol should begin at age 15 and be repeated every 5 years. If your lipid or cholesterol levels are high, you are over 50, or you are at  high risk for heart disease, you may need your cholesterol levels checked more frequently. Ongoing high lipid and cholesterol levels should be treated with medicines if diet and exercise are not working.  . If you smoke, find out from your health care provider how to quit. If you do not use tobacco, please do not start.  . If you choose to drink alcohol, please do not consume more than 2 drinks per day. One drink is considered to be 12 ounces (355 mL) of beer, 5 ounces (148 mL) of wine, or 1.5 ounces (44 mL) of liquor.  . If you are  41-7 years old, ask your health care provider if you should take aspirin to prevent strokes.  . Use sunscreen. Apply sunscreen liberally and repeatedly throughout the day. You should seek shade when your shadow is shorter than you. Protect yourself by wearing long sleeves, pants, a wide-brimmed hat, and sunglasses year round, whenever you are outdoors.  . Once a month, do a whole body skin exam, using a mirror to look at the skin on your back. Tell your health care provider of new moles, moles that have irregular borders, moles that are larger than a pencil eraser, or moles that have changed in shape or color.

## 2018-02-11 NOTE — Assessment & Plan Note (Signed)
A1C today  disc imp of low glycemic diet and wt loss to prevent DM2  Pt says diet has not been good lately

## 2018-02-11 NOTE — Assessment & Plan Note (Signed)
Still significantly sob on exertion - though the lasix helped edema Will schedule early f/u with cardiology

## 2018-02-11 NOTE — Assessment & Plan Note (Signed)
Worse of late with h/o DD/ CHF  This interferes significantly with mobility and ADLs Will schedule cardiology f/u

## 2018-02-11 NOTE — Assessment & Plan Note (Signed)
D level today  Disc imp to bone and overall health   

## 2018-02-11 NOTE — Assessment & Plan Note (Signed)
Ongoing  In setting of anx and sob  Also stressors  Labs pending

## 2018-02-11 NOTE — Assessment & Plan Note (Signed)
Pt c/o more anxiety lately  PHQ score is improved  Sob makes her more anx (? Vice versa also)  Continues high dose sertraline  Also xanax prn with caution  Reviewed stressors/ coping techniques/symptoms/ support sources/ tx options and side effects in detail today  Declines counseling ref

## 2018-02-11 NOTE — Assessment & Plan Note (Signed)
Some mild improvement on dexa 2/19  On ca and D S/p course of evista in the past  D level today

## 2018-02-11 NOTE — Assessment & Plan Note (Signed)
No further /new symptoms  Continue to control cholesterol and BP Continues plavix

## 2018-02-11 NOTE — Assessment & Plan Note (Signed)
Pt has seen Dr Carloyn Manner in the past-he is retiring Needs appt for ongoing care  Was not planning on surgery  Will ref to orthopedics

## 2018-02-11 NOTE — Assessment & Plan Note (Signed)
Discussed how this problem influences overall health and the risks it imposes  Reviewed plan for weight loss with lower calorie diet (via better food choices and also portion control or program like weight watchers) and exercise building up to or more than 30 minutes 5 days per week including some aerobic activity    

## 2018-02-11 NOTE — Assessment & Plan Note (Signed)
Disc goals for lipids and reasons to control them Rev last labs with pt Rev low sat fat diet in detail  Labs today - prev well controlled with statin and diet

## 2018-02-12 DIAGNOSIS — M4316 Spondylolisthesis, lumbar region: Secondary | ICD-10-CM | POA: Diagnosis not present

## 2018-02-12 DIAGNOSIS — M4716 Other spondylosis with myelopathy, lumbar region: Secondary | ICD-10-CM | POA: Diagnosis not present

## 2018-02-13 ENCOUNTER — Encounter: Payer: Self-pay | Admitting: Cardiovascular Disease

## 2018-02-14 ENCOUNTER — Encounter: Payer: Self-pay | Admitting: Cardiovascular Disease

## 2018-02-14 ENCOUNTER — Ambulatory Visit
Admission: RE | Admit: 2018-02-14 | Discharge: 2018-02-14 | Disposition: A | Discharge status: Home / Self Care | Payer: Medicare Other | Source: Ambulatory Visit | Attending: Cardiovascular Disease | Admitting: Cardiovascular Disease

## 2018-02-14 ENCOUNTER — Ambulatory Visit (INDEPENDENT_AMBULATORY_CARE_PROVIDER_SITE_OTHER): Payer: Medicare Other | Admitting: Cardiovascular Disease

## 2018-02-14 VITALS — BP 98/70 | HR 60 | Ht 64.0 in | Wt 194.8 lb

## 2018-02-14 DIAGNOSIS — I5032 Chronic diastolic (congestive) heart failure: Secondary | ICD-10-CM

## 2018-02-14 DIAGNOSIS — R0602 Shortness of breath: Secondary | ICD-10-CM

## 2018-02-14 DIAGNOSIS — R002 Palpitations: Secondary | ICD-10-CM | POA: Diagnosis not present

## 2018-02-14 NOTE — Patient Instructions (Addendum)
Medication Instructions:  Your provider recommends that you continue on your current medications as directed. Please refer to the Current Medication list given to you today.    Labwork: None  Testing/Procedures: Your provider has requested that you have a lexiscan myoview. For further information please visit [email or web address]. Please follow instruction sheet, as given.  Dr. Burt Knack recommends you have a bubble study echocardiogram.  Dr. Burt Knack recommends you have a chest x-ray. Please proceed to [place] in [facility] to complete your x-ray. You do not need an appointment. [facility] [address], [place], [place] [address]  Follow-Up: Your provider recommends that you schedule a follow-up appointment with Dr. Chase Caller.  Your provider wants you to follow-up in: 6 months with Dr. Burt Knack or his assistant. You will receive a reminder letter in the mail two months in advance. If you don't receive a letter, please call our office to schedule the follow-up appointment.

## 2018-02-14 NOTE — Progress Notes (Signed)
Cardiology Office Note:    Date:  02/14/2018   ID:  MARLAYSIA [name], DOB 07-03-1934, MRN [id number]  PCP:  Abner Greenspan, MD  Cardiologist:  Sherren Mocha, MD  Electrophysiologist:  None   Referring MD: Abner Greenspan, MD   Chief Complaint  Patient presents with  . Shortness of Breath    History of Present Illness:    [name] [name] is a 84 y.o. female with a hx of heart palpitations, presenting for follow-up evaluation.  The patient has been maintained on clopidogrel after a TIA many years ago.  She has been noted to have diastolic dysfunction on echo studies.  When she was last seen in June 2019 she was noted to have normal LV systolic function but grade 2 diastolic dysfunction was noted.  She was started on furosemide at that time.  The patient is here alone today.  Her shortness of breath is worsened significantly over the last 3 months.  She is now short of breath with minimal activity.  She denies orthopnea, PND, or leg swelling.  She denies cough or wheezing.  She is short of breath with walking short distances.  She has not appreciated any improvement with the use of furosemide.  States that her blood pressure generally runs low.  She complains of lightheadedness but denies syncope.  Past Medical History:  Diagnosis Date  . Alternating constipation and diarrhea   . Anxiety state, unspecified   . Complication of anesthesia    slow to wake up with surgery several years ago   . Depressive disorder, not elsewhere classified   . Diaphragmatic hernia without mention of obstruction or gangrene   . Difficulty sleeping   . Diverticulosis of colon (without mention of hemorrhage)   . Esophageal reflux   . External hemorrhoids without mention of complication   . Fatigue   . Frequency of urination   . Helicobacter pylori (H. pylori)   . Hiatal hernia   . History of transfusion   . Lumbar spondylosis   . Macular degeneration (senile) of retina, unspecified   .  Osteoarthrosis, unspecified whether generalized or localized, unspecified site 08-11-11   hx. "rhematoid arthritis", osteoarthritis, DDD, bursitis(hip)  . Osteoporosis   . Palpitations   . PONV (postoperative nausea and vomiting)   . Schatzki's ring   . Shortness of breath 08-11-11   with exertion only at present  . Unspecified cerebral artery occlusion with cerebral infarction 08-11-11   '97/ '06( TIA)-affected lt. side, slight weakness L side  . Unspecified vitamin D deficiency     Past Surgical History:  Procedure Laterality Date  . ABDOMINAL HYSTERECTOMY  08-11-11  . APPENDECTOMY    . BACK SURGERY  08-11-11   '11-hx. lumbar fusion with retained hardware  . CATARACT EXTRACTION  08-11-11   Bilateral  . ESOPHAGEAL DILATION    . KNEE SURGERY  08-11-11   rt. knee scope  . PATELLAR TENDON REPAIR Left 02/22/2014   Procedure: PATELLA TENDON REPAIR;  Surgeon: Gearlean Alf, MD;  Location: [facility];  Service: Orthopedics;  Laterality: Left;  . PATELLAR TENDON REPAIR Left 06/25/2014   Procedure: LEFT PATELLA TENDON REPAIR;  Surgeon: Gaynelle Arabian, MD;  Location: [facility];  Service: Orthopedics;  Laterality: Left;  . TOTAL KNEE ARTHROPLASTY  08/16/2011   Procedure: TOTAL KNEE ARTHROPLASTY;  Surgeon: Gearlean Alf, MD;  Location: [facility];  Service: Orthopedics;  Laterality: Right;  . TOTAL KNEE ARTHROPLASTY Left 01/27/2014   Procedure: LEFT TOTAL KNEE  ARTHROPLASTY;  Surgeon: Gearlean Alf, MD;  Location: [facility];  Service: Orthopedics;  Laterality: Left;  . TUBAL LIGATION      Current Medications: Current Meds  Medication Sig  . acetaminophen (TYLENOL) 325 MG tablet Take 2 tablets (650 mg total) by mouth every 6 (six) hours as needed for mild pain (or Fever >/= 101).  Marland Kitchen ALPRAZolam (XANAX) 0.5 MG tablet Take 1 tablet (0.5 mg total) by mouth at bedtime as needed. for sleep  . atorvastatin (LIPITOR) 10 MG tablet Take 1 tablet (10 mg total) by mouth daily.  . calcium carbonate (TUMS - DOSED IN MG  ELEMENTAL CALCIUM) 500 MG chewable tablet Chew 1 tablet (200 mg of elemental calcium total) by mouth 4 (four) times daily as needed for indigestion or heartburn.  . clopidogrel (PLAVIX) 75 MG tablet TAKE 1 TABLET DAILY  . diclofenac sodium (VOLTAREN) 1 % GEL Apply 1 application topically 2 (two) times daily.  Marland Kitchen esomeprazole (NEXIUM) 40 MG capsule Take 1 capsule (40 mg total) by mouth 2 (two) times daily before a meal.  . fluticasone (FLONASE) 50 MCG/ACT nasal spray Place 1-2 sprays into both nostrils daily as needed for allergies or rhinitis.  . furosemide (LASIX) 20 MG tablet Take 1 tablet (20 mg total) by mouth daily.  Marland Kitchen HYDROcodone-acetaminophen (NORCO) 10-325 MG tablet Take 1 tablet by mouth every 6 (six) hours as needed. Takes 1/2 tablet as needed  . methocarbamol (ROBAXIN) 500 MG tablet Take 1 tablet (500 mg total) by mouth every 6 (six) hours as needed for muscle spasms.  . metoprolol succinate (TOPROL XL) 25 MG 24 hr tablet TAKE ONE-HALF (1/2) TABLET AT BEDTIME  . Multiple Vitamins-Minerals (PRESERVISION AREDS PO) Take 1 capsule by mouth 2 (two) times daily.  Vladimir Faster Glycol-Propyl Glycol (SYSTANE OP) Apply 1-2 drops to eye at bedtime as needed (dry eyes.).  Marland Kitchen sertraline (ZOLOFT) 100 MG tablet Take 2 tablets (200 mg total) by mouth daily.     Allergies:   Alendronate sodium; Alendronate sodium; Celecoxib; Nexium [esomeprazole magnesium]; Omeprazole; Protonix [pantoprazole sodium]; Rabeprazole; and Latex   Social History   Socioeconomic History  . Marital status: Legally Separated    Spouse name: Not on file  . Number of children: 3  . Years of education: Not on file  . Highest education level: Not on file  Occupational History  . Occupation: disabled    Employer: RETIRED  Social Needs  . Financial resource strain: Not on file  . Food insecurity:    Worry: Not on file    Inability: Not on file  . Transportation needs:    Medical: Not on file    Non-medical: Not on file    Tobacco Use  . Smoking status: Never Smoker  . Smokeless tobacco: Never Used  Substance and Sexual Activity  . Alcohol use: No    Alcohol/week: 0.0 standard drinks  . Drug use: No  . Sexual activity: Never  Lifestyle  . Physical activity:    Days per week: Not on file    Minutes per session: Not on file  . Stress: Not on file  Relationships  . Social connections:    Talks on phone: Not on file    Gets together: Not on file    Attends religious service: Not on file    Active member of club or organization: Not on file    Attends meetings of clubs or organizations: Not on file    Relationship status: Not on file  Other  Topics Concern  . Not on file  Social History Narrative  . Not on file     Family History: The patient's family history includes Coronary artery disease in her brother; Diabetes in her brother and sister; Heart disease in her brother and father; Pancreatic cancer in her daughter. There is no history of Colon cancer, Colon polyps, Esophageal cancer, or Gallbladder disease.  ROS:   Please see the history of present illness.    Positive for chest pain, loss of vision, depression, back pain, easy bruising, excessive fatigue, chest pressure, snoring, anxiety, balance problems, headaches.  All other systems reviewed and are negative.  EKGs/Labs/Other Studies Reviewed:    The following studies were reviewed today: Echo 07/03/2017: Study Conclusions  - Left ventricle: The cavity size was normal. There was mild focal   basal hypertrophy of the septum. Systolic function was normal.   The estimated ejection fraction was in the range of 60% to 65%.   Wall motion was normal; there were no regional wall motion   abnormalities. Features are consistent with a pseudonormal left   ventricular filling pattern, with concomitant abnormal relaxation   and increased filling pressure (grade 2 diastolic dysfunction).   Doppler parameters are consistent with high ventricular  filling   pressure. - Aortic valve: There was mild regurgitation. - Mitral valve: There was mild regurgitation. - Left atrium: The atrium was mildly dilated. - Pulmonary arteries: Systolic pressure was mildly increased. PA   peak pressure: 34 mm Hg (S).  Impressions:  - Normal LV systolic function; moderate diastolic dysfunction; mild   AI; mild MR; mild LAE; mild TR with mild pulmonary hypertension.  Myoview Stress Test 06/24/2015: Study Highlights    Nuclear stress EF: 65%.  There was no ST segment deviation noted during stress.  There is a small defect of mild severity present in the basal inferoseptal and mid inferoseptal location. The defect is non-reversible with no ischemia noted.  There is a small defect of moderate severity present in the apical lateral and apex location. The defect is non-reversible and consistent with breast attenuation and diaphragatic attenuation artifact. No ischemia noted.  This is a low risk study.  The left ventricular ejection fraction is normal (55-65%).    EKG:  EKG is ordered today.  The ekg ordered today demonstrates normal sinus rhythm 60 bpm, within normal limits.  Recent Labs: 07/03/2017: NT-Pro BNP 437 02/09/2018: ALT 15; BUN 22; Creatinine, Ser 0.92; Hemoglobin 14.3; Platelets 254.0; Potassium 4.3; Sodium 140; TSH 2.22  Recent Lipid Panel    Component Value Date/Time   CHOL 187 02/09/2018 1348   TRIG 115.0 02/09/2018 1348   HDL 72.40 02/09/2018 1348   CHOLHDL 3 02/09/2018 1348   VLDL 23.0 02/09/2018 1348   LDLCALC 91 02/09/2018 1348    Physical Exam:    VS:  BP 98/70   Pulse 60   Ht 5\' 4"  (1.626 m)   Wt 194 lb 12.8 oz (88.4 kg)   SpO2 96%   BMI 33.44 kg/m     Wt Readings from Last 3 Encounters:  02/14/18 194 lb 12.8 oz (88.4 kg)  02/09/18 191 lb 8 oz (86.9 kg)  02/09/18 191 lb 8 oz (86.9 kg)     GEN: Well nourished, well developed, pleasant elderly woman in no acute distress HEENT: Normal NECK: No JVD; No  carotid bruits LYMPHATICS: No lymphadenopathy CARDIAC: RRR, no murmurs, rubs, gallops RESPIRATORY:  Clear to auscultation without rales, wheezing or rhonchi  ABDOMEN: Soft, non-tender, non-distended MUSCULOSKELETAL:  No edema; No deformity  SKIN: Warm and dry NEUROLOGIC:  Alert and oriented x 3 PSYCHIATRIC:  Normal affect   ASSESSMENT:    1. Chronic diastolic heart failure (HCC)   2. Palpitations   3. Shortness of breath    PLAN:    In order of problems listed above:  1. No clear evidence of volume overload on exam.  Lung fields are clear on my exam.  She has not had much benefit with furosemide.  Her BNP has been normal on past evaluation.  Blood pressure is low with no room to titrate medical therapy. 2. Stable at present with no recent issues 3. The patient shortness of breath has progressed significantly since I saw her last.  I am not sure of the etiology.  She does not have any obvious evidence of congestive heart failure/volume overload.  I am going to check a chest x-ray, limited echocardiogram with bubble study, and Myoview stress test to rule out ischemia.  If these studies do not demonstrate any clear etiology of her shortness of breath such as an ischemic equivalent or right to left shunt, I would be inclined to refer her back to pulmonary for further evaluation.  It is possible that her shortness of breath is multifactorial and she does have a significant component of deconditioning related to her back problems.   Medication Adjustments/Labs and Tests Ordered: Current medicines are reviewed at length with the patient today.  Concerns regarding medicines are outlined above.  Orders Placed This Encounter  Procedures  . DG Chest 2 View  . MYOCARDIAL PERFUSION IMAGING  . EKG 12-Lead  . ECHOCARDIOGRAM LIMITED BUBBLE STUDY   No orders of the defined types were placed in this encounter.   Patient Instructions  Medication Instructions:  Your provider recommends that you  continue on your current medications as directed. Please refer to the Current Medication list given to you today.    Labwork: None  Testing/Procedures: Your provider has requested that you have a lexiscan myoview. For further information please visit [email or web address]. Please follow instruction sheet, as given.  Dr. Burt Knack recommends you have a bubble study echocardiogram.  Dr. Burt Knack recommends you have a chest x-ray. Please proceed to [facility] to complete your x-ray. You do not need an appointment. [facility] [address], [place], [place] [address]  Follow-Up: Your provider recommends that you schedule a follow-up appointment with Dr. Chase Caller.  Your provider wants you to follow-up in: 6 months with Dr. Burt Knack or his assistant. You will receive a reminder letter in the mail two months in advance. If you don't receive a letter, please call our office to schedule the follow-up appointment.      Signed, Sherren Mocha, MD  02/14/2018 5:25 PM    [facility]

## 2018-02-16 DIAGNOSIS — H353132 Nonexudative age-related macular degeneration, bilateral, intermediate dry stage: Secondary | ICD-10-CM | POA: Diagnosis not present

## 2018-02-21 ENCOUNTER — Telehealth (HOSPITAL_COMMUNITY): Payer: Self-pay | Admitting: *Deleted

## 2018-02-21 NOTE — Telephone Encounter (Signed)
Patient given detailed instructions per Myocardial Perfusion Study Information Sheet for the test on 02/23/18. Patient notified to arrive 15 minutes early and that it is imperative to arrive on time for appointment to keep from having the test rescheduled.  If you need to cancel or reschedule your appointment, please call the office within 24 hours of your appointment. . Patient verbalized understanding. Kirstie Peri

## 2018-02-23 ENCOUNTER — Ambulatory Visit (HOSPITAL_COMMUNITY): Payer: Medicare Other | Attending: Internal Medicine

## 2018-02-23 ENCOUNTER — Ambulatory Visit (HOSPITAL_BASED_OUTPATIENT_CLINIC_OR_DEPARTMENT_OTHER): Payer: Medicare Other

## 2018-02-23 DIAGNOSIS — R0602 Shortness of breath: Secondary | ICD-10-CM

## 2018-02-23 LAB — MYOCARDIAL PERFUSION IMAGING
CHL CUP RESTING HR STRESS: 67 {beats}/min
LV dias vol: 53 mL (ref 46–106)
LV sys vol: 16 mL
Peak HR: 114 {beats}/min
SDS: 5
SRS: 0
SSS: 5
TID: 1.09

## 2018-02-23 MED ORDER — TECHNETIUM TC 99M TETROFOSMIN IV KIT
10.1000 | PACK | Freq: Once | INTRAVENOUS | Status: AC | PRN
  Administered 2018-02-23: 10.1 via INTRAVENOUS
  Filled 2018-02-23: qty 11

## 2018-02-23 MED ORDER — REGADENOSON 0.4 MG/5ML IV SOLN
0.4000 mg | Freq: Once | INTRAVENOUS | Status: AC
  Administered 2018-02-23: 0.4 mg via INTRAVENOUS

## 2018-02-23 MED ORDER — TECHNETIUM TC 99M TETROFOSMIN IV KIT
31.8000 | PACK | Freq: Once | INTRAVENOUS | Status: AC | PRN
  Administered 2018-02-23: 31.8 via INTRAVENOUS
  Filled 2018-02-23: qty 32

## 2018-03-07 ENCOUNTER — Ambulatory Visit (INDEPENDENT_AMBULATORY_CARE_PROVIDER_SITE_OTHER): Payer: Medicare Other | Admitting: Internal Medicine

## 2018-03-07 ENCOUNTER — Encounter: Payer: Self-pay | Admitting: Internal Medicine

## 2018-03-07 VITALS — BP 120/70 | HR 61 | Ht 64.0 in | Wt 195.2 lb

## 2018-03-07 DIAGNOSIS — R0602 Shortness of breath: Secondary | ICD-10-CM

## 2018-03-07 NOTE — Addendum Note (Signed)
Addended by: Nena Polio on: 03/07/2018 10:25 AM   Modules accepted: Orders

## 2018-03-07 NOTE — Addendum Note (Signed)
Addended by: Suzzanne Cloud E on: 03/07/2018 10:51 AM   Modules accepted: Orders

## 2018-03-07 NOTE — Patient Instructions (Addendum)
ICD-10-CM   1. DYSPNEA R06.02    Still suspect weight, physical deconditioning and stiff heart muscle [diastolic dysfunction is the main reason for shortness of breath]  Plan -Do blood test acetylcholine receptor antibody to ensure there is no neuromuscular weakness such as myasthenia gravis  Do high-resolution CT chest next few weeks  Test overnight oxygen desaturation study on room air at home  Follow-up Return to see me or nurse practitioner in the next few weeks but after completing all of the above  If these are all normal then we will consider setting of home physical therapy or home rehab program

## 2018-03-07 NOTE — Addendum Note (Signed)
Addended by: Nena Polio on: 03/07/2018 10:24 AM   Modules accepted: Orders

## 2018-03-07 NOTE — Addendum Note (Signed)
Addended by: Nena Polio on: 03/07/2018 10:49 AM   Modules accepted: Orders

## 2018-03-07 NOTE — Addendum Note (Signed)
Addended by: Nena Polio on: 03/07/2018 10:43 AM   Modules accepted: Orders

## 2018-03-07 NOTE — Addendum Note (Signed)
Addended by: Suzzanne Cloud E on: 03/07/2018 10:44 AM   Modules accepted: Orders

## 2018-03-07 NOTE — Progress Notes (Signed)
PCP Loura Pardon, MD  HPI  OV 08/05/2015  Chief Complaint  Patient presents with  . Advice Only    Referred for cough by Dr. Glori Bickers.  Pt c/o worsening sob, cough X3-4 months.       84 year old female who is the wife of my patient Mr. Oletha Cruel was idiopathic pulmonary fibrosis. Patient's granddaughter tells he is also friends with Dr. Legrand Como wert's daughter. She's been referred for cough and dyspnea. Her husband is extremely concerned that she also has pulmonary fibrosis due to common exposures. Patient denies any family history of pulmonary fibrosis. Reports insidious onset of shortness of breath and cough simultaneously approximately 3 months ago. It is progressive. Moderate in intensity. Cough quality is dry. Both symptoms are present with exertion relieved by rest. Walking across the hallway in the room with her baseline walker use produces dyspnea [she uses walker because of knee and back issues for the last few years] there is no associated syncope or wheezing. She did have a cardiac stress is 06/24/2015 by Dr. Sherren Mocha I review this result myself and this is normal with an echocardiogram normal LV function. Last chest x-ray in our health system was 01/20/2014 with clear lung fields personally visualized the chest x-ray. Lab work shows hemoglobin of 12.1 g percent or greater 2017 and a creatinine of 1.06 mg percent June 2016.   Social history: Her husband likely renal stone has been admitted at [facility] long  Walking desaturation test 185 feet 3 labs on room air: Walked only 1 lap and stopped due to knee pain. She is a walker to walk. For this discussion she did not desaturate.  reports that she has never smoked. She has never used smokeless tobacco.  OV 09/02/2015  Chief Complaint  Patient presents with  . Follow-up    Pt here after HRCT, ONO, and PFT. Pt states her breathing is unchanged since last OV. Pt c/o morning prod cough. Pt denies CP/tightness and f/c/s.      Here for shortness of breath follow-up. No new issues.She is frustrated. She has developed carpal tunnel syndrome because she's been using the walker for several years.  06/24/2015 2-D echocardiogram: Grade 2 diastolic dysfunction with normal left ventricular ejection systolic fraction. ISclerosis without stenosis  06/24/2015 [facility] care medicine cardiac stress test: Low risk nuclear scan  08/07/2015: Pulmonary function test normal except for mild reduction in diffusion capacity to 18.87/70% isolated reduction in diffusion capacity    08/11/2015 oxygen overnight desaturation test: Terrial Rhodes know that total time spent with a pulse ox while sleeping at night less than or equal to 88% was 2.7 minutes. This means she does not qualify or need nocturnal oxygen. I date of test 08/11/2015  09/02/2015 = feno 18 and no assthma   08/18/2015: CT chest 08/18/2015 Musculoskeletal: Chronic appearing compression fracture of T11 with approximately 40% loss of anterior vertebral body height is noted. There are no aggressive appearing lytic or blastic lesions noted in the visualized portions of the skeleton.  IMPRESSION:CT chest 08/18/2015 1. No definite evidence of interstitial lung disease.  2. There is mild post infectious or inflammatory scarring in the inferior segment of the lingula, and there are some scattered areas of mild cylindrical bronchiectasis in the lung bases bilaterally. 3. Aortic atherosclerosis, in addition to left anterior descending coronary artery disease. Assessment for potential risk factor modification, dietary therapy or pharmacologic therapy may be warranted, if clinically indicated. 4. There are calcifications of the aortic valve. Echocardiographic  correlation for evaluation of potential valvular dysfunction may be warranted if clinically indicated.   Electronically Signed   By: Vinnie Langton M.D.   On: 08/18/2015 15:08   OV 03/07/2018  Subjective:   Patient ID: [name] [name], female , DOB: 07/12/1934 , age 12 y.o. , MRN: [id number] , ADDRESS: [id number] [facility] [id number]   03/07/2018 -   Chief Complaint  Patient presents with  . Follow-up    Pt had a recent echo 2/7 and cxr performed 1/29. Pt states she has had problems with SOB that happens anytime with walking. States she becomes SOB very easily. Also has complaints of tightness in chest on the right side of her chest. Denies any problems with cough.     HPI [name] [name] 77 y.o. -wife of my disease patient Mr Donn Pierini who died from IPF in 2016/10/17.  I personally not seen the patient herself in 2-1/2 years.  She saw me back then for dyspnea on exertion relieved by rest.  We put it down to diastolic dysfunction.  Subsequently after her husband's death she was lost to follow-up.  She tells me since then dyspnea is gradually deteriorated and particularly in the last 6 months there is been significant worsening.  It appears that she is significantly dyspneic at rest and even doing simple tasks.  She is so dyspneic that she is unable to do even tasks like household work or shopping or walking up stairs.  She has extreme associated fatigue but only a mild cough.  All of this is documented below.  Symptoms are severe.  There is no wheezing.  She saw Dr. Burt Knack cardiology.  I reviewed the record.  It appears in the last 1-2 months her echocardiogram showed diastolic dysfunction but a cardiac stress test was normal.  She had a chest x-ray that I personally visualized and shows increased AP diameter from kyphosis but otherwise clear lung fields.  She had blood work which shows normal creatinine and hemoglobin.  She has been using a walker for the last 2 to 3 years because of knee and back issues which are chronic.   SYMPTOM SCALE -  03/07/2018   O2 use none  Shortness of Breath 0 -> 5 scale with 5 being worst (score 6 If unable to do)  At rest 4  Simple tasks - showers,  clothes change, eating, shaving 5  Household (dishes, doing bed, laundry) 6 - unable  Shopping 6 - unable  Walking level at own pace 6- unable  Walking keeping up with others of same age 13 - uanble  Walking up Stairs 6- unable  Walking up [place] 6- unable  Total (40 - 48) Dyspnea Score 61  How bad is your cough? 1  - mild clear  How bad is your fatigue 5    Walking desaturation test in our office today 185 feet x 3 laps on room air using a forehead probe and using a walker: Had R chest heaviness during walk. HR 64 -> 86, pulsoe ox 100% -> 98% and essentially normal. STopped x 2 to complete due to dyspnea  Thyroid function studies January 2020 is normal Okay ROS - per HPI     has a past medical history of Alternating constipation and diarrhea, Anxiety state, unspecified, Complication of anesthesia, Depressive disorder, not elsewhere classified, Diaphragmatic hernia without mention of obstruction or gangrene, Difficulty sleeping, Diverticulosis of colon (without mention of hemorrhage), Esophageal reflux, External hemorrhoids without mention of complication,  Fatigue, Frequency of urination, Helicobacter pylori (H. 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   reports that she has never smoked. She has never used smokeless tobacco.  Past Surgical History:  Procedure Laterality Date  . ABDOMINAL HYSTERECTOMY  08-11-11  . APPENDECTOMY    . BACK SURGERY  08-11-11   '11-hx. lumbar fusion with retained hardware  . CATARACT EXTRACTION  08-11-11   Bilateral  . ESOPHAGEAL DILATION    . KNEE SURGERY  08-11-11     rt. knee scope  . PATELLAR TENDON REPAIR Left 02/22/2014   Procedure: PATELLA TENDON REPAIR;  Surgeon: Gearlean Alf, MD;  Location: [facility];  Service: Orthopedics;  Laterality: Left;  . PATELLAR TENDON REPAIR Left 06/25/2014   Procedure: LEFT PATELLA TENDON REPAIR;  Surgeon: Gaynelle Arabian, MD;  Location: [facility];  Service: Orthopedics;  Laterality: Left;  . TOTAL KNEE ARTHROPLASTY  08/16/2011   Procedure: TOTAL KNEE ARTHROPLASTY;  Surgeon: Gearlean Alf, MD;  Location: [facility];  Service: Orthopedics;  Laterality: Right;  . TOTAL KNEE ARTHROPLASTY Left 01/27/2014   Procedure: LEFT TOTAL KNEE ARTHROPLASTY;  Surgeon: Gearlean Alf, MD;  Location: [facility];  Service: Orthopedics;  Laterality: Left;  . TUBAL LIGATION      Allergies  Allergen Reactions  . Alendronate Sodium     REACTION: JAW PAIN  . Alendronate Sodium Other (See Comments)    REACTION: JAW PAIN  . Celecoxib Other (See Comments)    REACTION: GI UPSET REACTION: GI UPSET  . Nexium [Esomeprazole Magnesium] Nausea And Vomiting    Pt cannot take the generic Does fine with DAW Pt cannot take the generic Does fine with DAW  . Omeprazole Nausea Only  . Protonix [Pantoprazole Sodium] Nausea And Vomiting  . Rabeprazole Nausea And Vomiting  . Latex Itching and Rash    Immunization History  Administered Date(s) Administered  . Influenza Split 10/17/2011  . Influenza Whole 10/24/2003, 10/19/2006, 10/15/2007, 11/15/2008, 10/17/2009  . Influenza, High Dose Seasonal PF 10/25/2016, 09/28/2017  . Influenza,inj,Quad PF,6+ Mos 09/19/2012, 11/26/2013  . Influenza-Unspecified 11/10/2014, 09/18/2015  . Pneumococcal Conjugate-13 05/07/2013  . Pneumococcal Polysaccharide-23 01/03/2002, 06/13/2007  . Td 10/09/1996, 06/13/2007  . Zoster 03/21/2013    Family History  Problem Relation Age of Onset  . Heart disease Father   . Coronary artery disease Brother   . Diabetes Brother   . Heart disease Brother   . Pancreatic cancer Daughter    . Diabetes Sister   . Colon cancer Neg Hx   . Colon polyps Neg Hx   . Esophageal cancer Neg Hx   . Gallbladder disease Neg Hx      Current Outpatient Medications:  .  acetaminophen (TYLENOL) 325 MG tablet, Take 2 tablets (650 mg total) by mouth every 6 (six) hours as needed for mild pain (or Fever >/= 101)., Disp: 40 tablet, Rfl: 0 .  ALPRAZolam (XANAX) 0.5 MG tablet, Take 1 tablet (0.5 mg total) by mouth at bedtime as needed. for sleep, Disp: 90 tablet, Rfl: 1 .  atorvastatin (LIPITOR) 10 MG tablet, Take 1 tablet (10 mg total) by mouth daily., Disp: 90 tablet, Rfl: 3 .  calcium carbonate (TUMS - DOSED IN MG ELEMENTAL CALCIUM) 500 MG chewable tablet, Chew 1 tablet (200  mg of elemental calcium total) by mouth 4 (four) times daily as needed for indigestion or heartburn., Disp: 10 tablet, Rfl: 0 .  clopidogrel (PLAVIX) 75 MG tablet, TAKE 1 TABLET DAILY, Disp: 90 tablet, Rfl: 1 .  diclofenac sodium (VOLTAREN) 1 % GEL, Apply 1 application topically 2 (two) times daily., Disp: , Rfl:  .  esomeprazole (NEXIUM) 40 MG capsule, Take 1 capsule (40 mg total) by mouth 2 (two) times daily before a meal., Disp: 180 capsule, Rfl: 3 .  fluticasone (FLONASE) 50 MCG/ACT nasal spray, Place 1-2 sprays into both nostrils daily as needed for allergies or rhinitis., Disp: , Rfl:  .  furosemide (LASIX) 20 MG tablet, Take 1 tablet (20 mg total) by mouth daily., Disp: 90 tablet, Rfl: 3 .  HYDROcodone-acetaminophen (NORCO) 10-325 MG tablet, Take 1 tablet by mouth every 6 (six) hours as needed. Takes 1/2 tablet as needed, Disp: , Rfl: 0 .  methocarbamol (ROBAXIN) 500 MG tablet, Take 1 tablet (500 mg total) by mouth every 6 (six) hours as needed for muscle spasms., Disp: 80 tablet, Rfl: 0 .  metoprolol succinate (TOPROL XL) 25 MG 24 hr tablet, TAKE ONE-HALF (1/2) TABLET AT BEDTIME, Disp: 45 tablet, Rfl: 2 .  Multiple Vitamins-Minerals (PRESERVISION AREDS PO), Take 1 capsule by mouth 2 (two) times daily., Disp: , Rfl:  .   Polyethyl Glycol-Propyl Glycol (SYSTANE OP), Apply 1-2 drops to eye at bedtime as needed (dry eyes.)., Disp: , Rfl:  .  sertraline (ZOLOFT) 100 MG tablet, Take 2 tablets (200 mg total) by mouth daily., Disp: 180 tablet, Rfl: 3      Objective:   Vitals:   03/07/18 0919  BP: 120/70  Pulse: 61  SpO2: 97%  Weight: 195 lb 3.2 oz (88.5 kg)  Height: 5\' 4"  (1.626 m)    Estimated body mass index is 33.51 kg/m as calculated from the following:   Height as of this encounter: 5\' 4"  (1.626 m).   Weight as of this encounter: 195 lb 3.2 oz (88.5 kg).  @WEIGHTCHANGE @  [employer]   03/07/18 0919  Weight: 195 lb 3.2 oz (88.5 kg)     Physical Exam  General Appearance:    Alert, cooperative, no distress, appears stated age - older , Deconditioned looking - yes , OBESE  - yes, Sitting on Wheelchair -  No but has walker  Head:    Normocephalic, without obvious abnormality, atraumatic  Eyes:    PERRL, conjunctiva/corneas clear,  Ears:    Normal TM's and external ear canals, both ears  Nose:   Nares normal, septum midline, mucosa normal, no drainage    or sinus tenderness. OXYGEN ON  - no . Patient is @ ra   Throat:   Lips, mucosa, and tongue normal; teeth and gums normal. Cyanosis on lips - no  Neck:   Supple, symmetrical, trachea midline, no adenopathy;    thyroid:  no enlargement/tenderness/nodules; no carotid   bruit or JVD  Back:     Symmetric, no curvature, ROM normal, no CVA tenderness  Lungs:     Distress - no , Wheeze no, Barrell Chest - no, Purse lip breathing - no, Crackles - o. INCREASED AP DIAMETER due to [facility]   Chest Wall:    No tenderness or deformity.    Heart:    Regular rate and rhythm, S1 and S2 normal, no rub   or gallop, Murmur - no  Breast Exam:    NOT DONE  Abdomen:  Soft, non-tender, bowel sounds active all four quadrants,    no masses, no organomegaly. Visceral obesity - yes  Genitalia:   NOT DONE  Rectal:   NOT DONE  Extremities:   Extremities - normal,  Has Cane - no, Clubbing - no, Edema - no. WALKER +  Pulses:   2+ and symmetric all extremities  Skin:   Stigmata of Connective Tissue Disease - no  Lymph nodes:   Cervical, supraclavicular, and axillary nodes normal  Psychiatric:  Neurologic:   Pleasant - yes, Anxious - no, Flat affect - yes  CAm-ICU - neg, Alert and Oriented x 3 - yes, Moves all 4s - yes, Speech - normal, Cognition - intact           Assessment:       ICD-10-CM   1. DYSPNEA R06.02        Plan:     Patient Instructions     ICD-10-CM   1. DYSPNEA R06.02    Still suspect weight, physical deconditioning and stiff heart muscle [diastolic dysfunction is the main reason for shortness of breath]  Plan -Do blood test acetylcholine receptor antibody to ensure there is no neuromuscular weakness such as myasthenia gravis  Do high-resolution CT chest next few weeks  Test overnight oxygen desaturation study on room air at home  Follow-up Return to see me or nurse practitioner in the next few weeks but after completing all of the above  If these are all normal then we will consider setting of home physical therapy or home rehab program   > 50% of this > 25 min visit spent in face to face counseling or coordination of care - by this undersigned MD - Dr Brand Males. This includes one or more of the following documented above: discussion of test results, diagnostic or treatment recommendations, prognosis, risks and benefits of management options, instructions, education, compliance or risk-factor reduction   SIGNATURE    Dr. Brand Males, M.D., F.C.C.P,  Pulmonary and Critical Care Medicine Staff Physician, [place] Director - Interstitial Lung Disease  Program  P[facility] [place] [facility] [place], [place], [address]  Pager: [phone], If no answer or between  15:00h - 7:00h: call [phone] Telephone: [phone]  10:19  AM 03/07/2018

## 2018-03-12 ENCOUNTER — Telehealth: Payer: Self-pay | Admitting: Internal Medicine

## 2018-03-12 NOTE — Telephone Encounter (Signed)
Spoke to pt to let her know I have sent this out to [facility] for her.  Pt verbalized understanding & nothing further needed at this time.

## 2018-03-13 ENCOUNTER — Other Ambulatory Visit: Payer: Medicare Other

## 2018-03-13 ENCOUNTER — Ambulatory Visit
Admission: RE | Admit: 2018-03-13 | Discharge: 2018-03-13 | Disposition: A | Discharge status: Home / Self Care | Payer: Medicare Other | Source: Ambulatory Visit | Attending: Internal Medicine | Admitting: Internal Medicine

## 2018-03-13 DIAGNOSIS — R0602 Shortness of breath: Secondary | ICD-10-CM

## 2018-03-13 DIAGNOSIS — R0789 Other chest pain: Secondary | ICD-10-CM | POA: Diagnosis not present

## 2018-03-14 ENCOUNTER — Encounter: Payer: Self-pay | Admitting: Internal Medicine

## 2018-03-16 ENCOUNTER — Telehealth: Payer: Self-pay | Admitting: Internal Medicine

## 2018-03-16 DIAGNOSIS — G4734 Idiopathic sleep related nonobstructive alveolar hypoventilation: Secondary | ICD-10-CM

## 2018-03-16 DIAGNOSIS — R0602 Shortness of breath: Secondary | ICD-10-CM | POA: Diagnosis not present

## 2018-03-16 NOTE — Telephone Encounter (Signed)
Based on ONO results - patient needs 2L  of O2 at night. She will need a repeat ONO on O2 after she gets set up with oxygen. Please order O2.

## 2018-03-16 NOTE — Telephone Encounter (Signed)
ONO results have been received and have been given to [facility] for review.

## 2018-03-16 NOTE — Telephone Encounter (Signed)
Pt has an upcoming OV with Lazaro Arms, NP 03/19/2018.  Since MR is not at office, when results are received, can we check with Kenney Houseman to see if she is fine reviewing the ONO?

## 2018-03-16 NOTE — Telephone Encounter (Signed)
checked MR's box up front and fax machine. It does not appears that results have been received. I have spoke to [place] with [employer] and requested that ONO be faxed to triage fax number. Will leave message in triage until fax is received.

## 2018-03-16 NOTE — Telephone Encounter (Signed)
Spoke with Caryl Pina from [place]. He stated that [employer] had the ONO results from the test that was performed yesterday and based on the information, patient would qualify for O2 at night.   He wanted to know if MR would be able to place the order today. Advised him that normally the providers will look at the raw data first and then decide on the order. Also advised him that MR is currently working at the hospital so he would not be able to review results. He verbalized understanding.   I have requested that he fax the results to [place] attn to [place].   Will route to [facility] for follow up.

## 2018-03-16 NOTE — Telephone Encounter (Signed)
Pt is aware of results and voiced her understanding.  Pt would like to proceed with night time oxygen.  Order has been placed for 2L oxygen QHS.  ONO on 2L has been ordered as well. Nothing further is needed.

## 2018-03-16 NOTE — Telephone Encounter (Signed)
Yes. That would be fine. Please get me the fax.

## 2018-03-18 DIAGNOSIS — H353132 Nonexudative age-related macular degeneration, bilateral, intermediate dry stage: Secondary | ICD-10-CM | POA: Diagnosis not present

## 2018-03-18 LAB — ACETYLCHOLINE RECEPTOR AB, ALL
AChR Binding Ab, Serum: <0.03 nmol/L (ref 0.00–0.24)
Acetylchol Block Ab: 19 % (ref 0–25)
Acetylcholine Modulat Ab: <12 % (ref 0–20)

## 2018-03-19 ENCOUNTER — Encounter: Payer: Self-pay | Admitting: Nurse Practitioner

## 2018-03-19 ENCOUNTER — Ambulatory Visit (INDEPENDENT_AMBULATORY_CARE_PROVIDER_SITE_OTHER): Payer: Medicare Other | Admitting: Nurse Practitioner

## 2018-03-19 VITALS — BP 128/78 | HR 65 | Ht 64.0 in | Wt 196.2 lb

## 2018-03-19 DIAGNOSIS — R5381 Other malaise: Secondary | ICD-10-CM

## 2018-03-19 DIAGNOSIS — Z9181 History of falling: Secondary | ICD-10-CM | POA: Diagnosis not present

## 2018-03-19 NOTE — Assessment & Plan Note (Signed)
Presents today for a follow-up visit for test results.  Discussed her recent test results in office today.  Her acetylcholine receptor labs came back within normal limits.  Recent HRCT showed no evidence of interstitial lung disease but did show some air trapping indicative of small airways disease.  Patient's recent ONO did qualify her for 2 L of oxygen at night.  She has been compliant with the oxygen at night.  A repeat ONO has been scheduled to be done with oxygen.  As Dr. Chase Caller noted at last visit it appears that patient's overall deconditioning and underlying anxiety are contributing to shortness of breath.  We will arrange for home physical therapy.  Patient Instructions  Discussed recent tests with patient in office today Will order home PT for deconditioning  Continue current medications Stay active Follow up with Dr. Chase Caller in around 4 weeks at first available appointment

## 2018-03-19 NOTE — Progress Notes (Addendum)
@Patient  ID: [name] [name], female    DOB: November 11, 1934, 84 y.o.   MRN: [id number]  Chief Complaint  Patient presents with  . Results    Chest CT, labs, Echo    Referring provider: Tower, Wynelle Fanny, MD  HPI 84 year old female never smoker with dyspnea who is followed by Dr. Chase Caller.  Tests: HRCT 03/13/18 - No definitive evidence of interstitial lung disease. Air trapping is indicative of small airways disease.  Acetylocholine Receptor Ab, All 03/07/18 - WNL  Thyroid Function studies January 2020 normal  PFT Results Latest Ref Rng & Units 08/07/2015  FVC-Pre L 2.98  FVC-Predicted Pre % 102  FVC-Post L 3.01  FVC-Predicted Post % 102  Pre FEV1/FVC % % 73  Post FEV1/FCV % % 80  FEV1-Pre L 2.19  FEV1-Predicted Pre % 100  FEV1-Post L 2.40  DLCO UNC% % 70  DLCO COR %Predicted % 77  TLC L 5.53  TLC % Predicted % 103  RV % Predicted % 120    SIX MIN WALK 03/07/2018 08/05/2015  Supplimental Oxygen during Test? (L/min) No No  Tech Comments: pt walked a slow to moderate pace with walker, noted sob throughout walk, stopped X2 to catch breath.  pt walked a slow pace, could not walk laps 2 and 3 d/t knee pain.    Last OV Dr. Chase Caller 2/19/20[name] 25 y.o. -wife of my disease patient Mr [name] [name] who died from IPF in October 23, 2016.  I personally not seen the patient herself in 2-1/2 years.  She saw me back then for dyspnea on exertion relieved by rest.  We put it down to diastolic dysfunction.  Subsequently after her husband's death she was lost to follow-up.  She tells me since then dyspnea is gradually deteriorated and particularly in the last 6 months there is been significant worsening.  It appears that she is significantly dyspneic at rest and even doing simple tasks.  She is so dyspneic that she is unable to do even tasks like household work or shopping or walking up stairs.  She has extreme associated fatigue but only a mild cough.  All of this is documented  below.  Symptoms are severe.  There is no wheezing.  She saw Dr. Burt Knack cardiology.  I reviewed the record.  It appears in the last 1-2 months her echocardiogram showed diastolic dysfunction but a cardiac stress test was normal.  She had a chest x-ray that I personally visualized and shows increased AP diameter from kyphosis but otherwise clear lung fields.  She had blood work which shows normal creatinine and hemoglobin.  She has been using a walker for the last 2 to 3 years because of knee and back issues which are chronic. Plan: Still suspect weight, physical deconditioning and stiff heart muscle [diastolic dysfunction is the main reason for shortness of breath] -Do blood test acetylcholine receptor antibody to ensure there is no neuromuscular weakness such as myasthenia gravis Do high-resolution CT chest next few weeks Test overnight oxygen desaturation study on room air at home Follow-up Return to see me or nurse practitioner in the next few weeks but after completing all of the above If these are all normal then we will consider setting of home physical therapy or home rehab program   OV 03/19/18 - Follow up visit for lab results Patient presents today for a follow-up after recent lab work and imaging.  Patient has been complaining of ongoing shortness of breath and mild cough over  the past several months.  She has recently had a cardiac work-up.  Her echocardiogram showed diastolic dysfunction but cardiac stress test was normal.  Overall her blood work is come back normal including acetylcholine receptors.  Uses a walker daily due to chronic back issues.  Denies any chest pain or edema.  She still complains of shortness of breath with activities of daily living.  She states that she does have ongoing anxiety issues related to family problems.  She is questioning if some of her shortness of breath could be related to anxiety.  Patient was recently set up for oxygen at night after overnight oximetry  qualified her for oxygen.  She is wearing 2 L of oxygen at night.  Is tolerating it well.  She started using her oxygen at night this past Saturday, 03/17/2018. Flutter valve is used to clear secretions.    Allergies  Allergen Reactions  . Alendronate Sodium     REACTION: JAW PAIN  . Alendronate Sodium Other (See Comments)    REACTION: JAW PAIN  . Celecoxib Other (See Comments)    REACTION: GI UPSET REACTION: GI UPSET  . Nexium [Esomeprazole Magnesium] Nausea And Vomiting    Pt cannot take the generic Does fine with DAW Pt cannot take the generic Does fine with DAW  . Omeprazole Nausea Only  . Protonix [Pantoprazole Sodium] Nausea And Vomiting  . Rabeprazole Nausea And Vomiting  . Latex Itching and Rash    Immunization History  Administered Date(s) Administered  . Influenza Split 10/17/2011  . Influenza Whole 10/24/2003, 10/19/2006, 10/15/2007, 11/15/2008, 10/17/2009  . Influenza, High Dose Seasonal PF 10/25/2016, 09/28/2017  . Influenza,inj,Quad PF,6+ Mos 09/19/2012, 11/26/2013  . Influenza-Unspecified 11/10/2014, 09/18/2015  . Pneumococcal Conjugate-13 05/07/2013  . Pneumococcal Polysaccharide-23 01/03/2002, 06/13/2007  . Td 10/09/1996, 06/13/2007  . Zoster 03/21/2013    Past Medical History:  Diagnosis Date  . Alternating constipation and diarrhea   . Anxiety state, unspecified   . Complication of anesthesia    slow to wake up with surgery several years ago   . Depressive disorder, not elsewhere classified   . Diaphragmatic hernia without mention of obstruction or gangrene   . Difficulty sleeping   . Diverticulosis of colon (without mention of hemorrhage)   . Esophageal reflux   . External hemorrhoids without mention of complication   . Fatigue   . Frequency of urination   . Helicobacter pylori (H. pylori)   . Hiatal hernia   . History of transfusion   . Lumbar spondylosis   . Macular degeneration (senile) of retina, unspecified   . Osteoarthrosis, unspecified  whether generalized or localized, unspecified site 08-11-11   hx. "rhematoid arthritis", osteoarthritis, DDD, bursitis(hip)  . Osteoporosis   . Palpitations   . PONV (postoperative nausea and vomiting)   . Schatzki's ring   . Shortness of breath 08-11-11   with exertion only at present  . Unspecified cerebral artery occlusion with cerebral infarction 08-11-11   '97/ '06( TIA)-affected lt. side, slight weakness L side  . Unspecified vitamin D deficiency     Tobacco History: Social History   Tobacco Use  Smoking Status Never Smoker  Smokeless Tobacco Never Used   Counseling given: Yes   Outpatient Encounter Medications as of 03/19/2018  Medication Sig  . acetaminophen (TYLENOL) 325 MG tablet Take 2 tablets (650 mg total) by mouth every 6 (six) hours as needed for mild pain (or Fever >/= 101).  Marland Kitchen ALPRAZolam (XANAX) 0.5 MG tablet Take 1 tablet (0.5  mg total) by mouth at bedtime as needed. for sleep  . atorvastatin (LIPITOR) 10 MG tablet Take 1 tablet (10 mg total) by mouth daily.  . calcium carbonate (TUMS - DOSED IN MG ELEMENTAL CALCIUM) 500 MG chewable tablet Chew 1 tablet (200 mg of elemental calcium total) by mouth 4 (four) times daily as needed for indigestion or heartburn.  . clopidogrel (PLAVIX) 75 MG tablet TAKE 1 TABLET DAILY  . diclofenac sodium (VOLTAREN) 1 % GEL Apply 1 application topically 2 (two) times daily.  Marland Kitchen esomeprazole (NEXIUM) 40 MG capsule Take 1 capsule (40 mg total) by mouth 2 (two) times daily before a meal.  . fluticasone (FLONASE) 50 MCG/ACT nasal spray Place 1-2 sprays into both nostrils daily as needed for allergies or rhinitis.  . furosemide (LASIX) 20 MG tablet Take 1 tablet (20 mg total) by mouth daily.  Marland Kitchen HYDROcodone-acetaminophen (NORCO) 10-325 MG tablet Take 1 tablet by mouth every 6 (six) hours as needed. Takes 1/2 tablet as needed  . methocarbamol (ROBAXIN) 500 MG tablet Take 1 tablet (500 mg total) by mouth every 6 (six) hours as needed for muscle  spasms.  . metoprolol succinate (TOPROL XL) 25 MG 24 hr tablet TAKE ONE-HALF (1/2) TABLET AT BEDTIME  . Multiple Vitamins-Minerals (PRESERVISION AREDS PO) Take 1 capsule by mouth 2 (two) times daily.  Vladimir Faster Glycol-Propyl Glycol (SYSTANE OP) Apply 1-2 drops to eye at bedtime as needed (dry eyes.).  Marland Kitchen sertraline (ZOLOFT) 100 MG tablet Take 2 tablets (200 mg total) by mouth daily.   No facility-administered encounter medications on file as of 03/19/2018.      Review of Systems  Review of Systems  Constitutional: Negative.  Negative for chills and fever.  HENT: Negative.   Respiratory: Negative for cough and shortness of breath.   Cardiovascular: Negative.  Negative for chest pain, palpitations and leg swelling.  Gastrointestinal: Negative.   Allergic/Immunologic: Negative.   Neurological: Negative.   Psychiatric/Behavioral: Negative.        Physical Exam  BP 128/78 (BP Location: Left Arm, Patient Position: Sitting, Cuff Size: Normal)   Pulse 65   Ht 5\' 4"  (1.626 m)   Wt 196 lb 3.2 oz (89 kg)   SpO2 96%   BMI 33.68 kg/m   Wt Readings from Last 5 Encounters:  03/19/18 196 lb 3.2 oz (89 kg)  03/07/18 195 lb 3.2 oz (88.5 kg)  02/14/18 194 lb 12.8 oz (88.4 kg)  02/09/18 191 lb 8 oz (86.9 kg)  02/09/18 191 lb 8 oz (86.9 kg)     Physical Exam Vitals signs and nursing note reviewed.  Constitutional:      General: She is not in acute distress.    Appearance: She is well-developed.  Cardiovascular:     Rate and Rhythm: Normal rate and regular rhythm.  Pulmonary:     Effort: Pulmonary effort is normal. No respiratory distress.     Breath sounds: Normal breath sounds. No wheezing or rhonchi.  Musculoskeletal:        General: No swelling.  Neurological:     Mental Status: She is alert and oriented to person, place, and time.       Imaging: Ct Chest High Resolution  Result Date: 03/14/2018 CLINICAL DATA:  Shortness of breath for 6 months, right upper chest  discomfort. EXAM: CT CHEST WITHOUT CONTRAST TECHNIQUE: Multidetector CT imaging of the chest was performed following the standard protocol without intravenous contrast. High resolution imaging of the lungs, as well as inspiratory and  expiratory imaging, was performed. COMPARISON:  08/18/2015. FINDINGS: Cardiovascular: Atherosclerotic calcification of the aorta, aortic valve and coronary arteries. Heart is at the upper limits of normal in size to mildly enlarged. No pericardial effusion. Mediastinum/Nodes: No pathologically enlarged mediastinal or axillary lymph nodes. Hilar regions are difficult to evaluate without IV contrast. Esophagus is grossly unremarkable. Moderate hiatal hernia. Lungs/Pleura: Negative for subpleural reticulation, traction bronchiectasis/bronchiolectasis, ground-glass, architectural distortion. Mild basilar pleuroparenchymal scarring. No pleural fluid. Airway is unremarkable. There is air trapping. Upper Abdomen: Visualized portion of the liver is unremarkable. Stones are seen in the gallbladder. Adrenal glands and visualized portions of the kidneys, spleen, pancreas and stomach are grossly unremarkable with exception of a moderate hiatal hernia. Duodenal diverticulum is partially imaged. No upper abdominal adenopathy. Musculoskeletal: Degenerative changes in the spine. No worrisome lytic or sclerotic lesions. Lower thoracic compression fracture is unchanged. Old sternal fracture. IMPRESSION: 1. No definitive evidence of interstitial lung disease. Air trapping is indicative of small airways disease. 2. Cholelithiasis. 3. Moderate hiatal hernia. 4. Aortic atherosclerosis (ICD10-170.0). Coronary artery calcification. Electronically Signed   By: Lorin Picket M.D.   On: 03/14/2018 09:52     Assessment & Plan:   Physical deconditioning Presents today for a follow-up visit for test results.  Discussed her recent test results in office today.  Her acetylcholine receptor labs came back  within normal limits.  Recent HRCT showed no evidence of interstitial lung disease but did show some air trapping indicative of small airways disease.  Patient's recent ONO did qualify her for 2 L of oxygen at night.  She has been compliant with the oxygen at night.  A repeat ONO has been scheduled to be done with oxygen.  As Dr. Chase Caller noted at last visit it appears that patient's overall deconditioning and underlying anxiety are contributing to shortness of breath.  We will arrange for home physical therapy.  Patient Instructions  Discussed recent tests with patient in office today Will order home PT for deconditioning  Continue current medications flutter valve is used to clear secretions Stay active Follow up with Dr. Chase Caller in around 4 weeks at first available appointment       Fenton Foy, NP 03/19/2018

## 2018-03-19 NOTE — Patient Instructions (Signed)
Discussed recent tests with patient in office today Will order home PT for deconditioning  Continue current medications Stay active Follow up with Dr. Chase Caller in around 4 weeks at first available appointment

## 2018-03-20 ENCOUNTER — Telehealth: Payer: Self-pay | Admitting: Internal Medicine

## 2018-03-20 DIAGNOSIS — R5381 Other malaise: Secondary | ICD-10-CM

## 2018-03-20 NOTE — Addendum Note (Signed)
Addended by: Jannette Spanner on: 03/20/2018 02:33 PM   Modules accepted: Orders

## 2018-03-20 NOTE — Telephone Encounter (Signed)
Spoke with pt, advised her that the PT was not a service [facility] does. Pt understood and wanted to know if there was some breathing exercises she could do at home? She would like [place] to mail her the instructions if any. Please advise MR.

## 2018-03-20 NOTE — Telephone Encounter (Signed)
The order should have been placed yesterday. It looks like it has been ordered already.

## 2018-03-20 NOTE — Telephone Encounter (Signed)
[name] [name]. Do we need to send order for Home Health?      Patient Instructions by Fenton Foy, NP at 03/19/2018 1:30 PM  Author: Fenton Foy, NP Author Type: Nurse Practitioner Filed: 03/19/2018 2:30 PM  Note Status: Signed Cosign: Cosign Not Required Encounter Date: 03/19/2018  Editor: Fenton Foy, NP (Nurse Practitioner)    Discussed recent tests with patient in office today Will order home PT for deconditioning  Continue current medications Stay active Follow up with Dr. Chase Caller in around 4 weeks at first available appointment

## 2018-03-20 NOTE — Telephone Encounter (Signed)
Community message received this morning from [place], Ace Gins.  She stated that [facility] does not provide home PT.  Order placed 03/19/18, from [place], NP, for home PT.  Message routed to [facility]

## 2018-03-20 NOTE — Telephone Encounter (Signed)
Called pt and advised message from the provider. Pt understood and verbalized understanding. Nothing further is needed.   Order placed to [place].

## 2018-03-20 NOTE — Telephone Encounter (Signed)
[facility] does not provide home PT & patient will need to be notified.  Also, this will require a new referral on ta home health template.

## 2018-03-20 NOTE — Telephone Encounter (Addendum)
She can get a flutter valve aand incentive spirometry and do each 5-10 times on the hour during the day. Will need DME prescription  Also, ONO RA 03/14/2018 - 61 min </= 88% and looks like Tonya started o2

## 2018-03-29 DIAGNOSIS — H524 Presbyopia: Secondary | ICD-10-CM | POA: Diagnosis not present

## 2018-03-29 DIAGNOSIS — H43813 Vitreous degeneration, bilateral: Secondary | ICD-10-CM | POA: Diagnosis not present

## 2018-03-29 DIAGNOSIS — D4981 Neoplasm of unspecified behavior of retina and choroid: Secondary | ICD-10-CM | POA: Diagnosis not present

## 2018-03-29 DIAGNOSIS — H353132 Nonexudative age-related macular degeneration, bilateral, intermediate dry stage: Secondary | ICD-10-CM | POA: Diagnosis not present

## 2018-03-29 DIAGNOSIS — H04123 Dry eye syndrome of bilateral lacrimal glands: Secondary | ICD-10-CM | POA: Diagnosis not present

## 2018-04-15 NOTE — Telephone Encounter (Signed)
error 

## 2018-04-16 ENCOUNTER — Encounter: Payer: Self-pay | Admitting: General Surgery

## 2018-04-16 NOTE — Progress Notes (Signed)
Please call to let patient know to continue O2 at 2L at night per recent ONO results. Thanks.

## 2018-04-17 ENCOUNTER — Telehealth: Payer: Self-pay | Admitting: Nurse Practitioner

## 2018-04-17 ENCOUNTER — Telehealth: Payer: Self-pay | Admitting: Cardiovascular Disease

## 2018-04-17 NOTE — Telephone Encounter (Signed)
Pt c/o Shortness Of Breath: STAT if SOB developed within the last 24 hours or pt is noticeably SOB on the phone  1. Are you currently SOB (can you hear that pt is SOB on the phone)? Yes   2. How long have you been experiencing SOB? About a month ago.   3. Are you SOB when sitting or when up moving around? Yes   4. Are you currently experiencing any other symptoms? Tired and weak patient is on oxygen.

## 2018-04-17 NOTE — Telephone Encounter (Signed)
Patient wanted to be made aware of the telephone visit tomorrow nothing further needed at this time.

## 2018-04-17 NOTE — Telephone Encounter (Signed)
Ms. Bernardini reports Dr. Chase Caller conducted an ONO in February and gave her night time oxygen. She has been experiencing dyspnea on exertion especially over the last couple weeks. She has found herself wearing her O2 as needed during the day when she feels like she's struggling with her breathing.  BP today: 115/66  HR today: 68 O2 sat today on RA: 95% Reviewed coughing/deep breathing exercises when using pulse-ox if saturations are low. She wants to know if she needs to see Dr. Burt Knack since Dr. Chase Caller is working in the hospital. She states she has a phone appointment with Dr. Golden Pop PA tomorrow. Encouraged her to have appointment with Pulmonary tomorrow and to call Thursday morning if Pulmonary thinks her issues are cardiac.  If she needs an appointment with Dr. Burt Knack at that time, a phone appointment will be scheduled for Thursday. She was grateful for assistance.

## 2018-04-18 ENCOUNTER — Ambulatory Visit (INDEPENDENT_AMBULATORY_CARE_PROVIDER_SITE_OTHER): Payer: Medicare Other | Admitting: Nurse Practitioner

## 2018-04-18 ENCOUNTER — Ambulatory Visit: Payer: Medicare Other | Admitting: Internal Medicine

## 2018-04-18 ENCOUNTER — Encounter: Payer: Self-pay | Admitting: Nurse Practitioner

## 2018-04-18 ENCOUNTER — Other Ambulatory Visit: Payer: Self-pay

## 2018-04-18 ENCOUNTER — Telehealth (HOSPITAL_COMMUNITY): Payer: Self-pay

## 2018-04-18 DIAGNOSIS — R0600 Dyspnea, unspecified: Secondary | ICD-10-CM

## 2018-04-18 DIAGNOSIS — H353132 Nonexudative age-related macular degeneration, bilateral, intermediate dry stage: Secondary | ICD-10-CM | POA: Diagnosis not present

## 2018-04-18 DIAGNOSIS — R5381 Other malaise: Secondary | ICD-10-CM | POA: Diagnosis not present

## 2018-04-18 NOTE — Assessment & Plan Note (Signed)
Discussion: Patient states that she is still having ongoing issues with shortness of breath with exertion.  She is wearing O2 at night as advised after overnight O2 study.  She states that she does check her oxygen level during the day and it is staying above 93% on room air.  She has not started home PT yet.  She does get anxious at times and in turn becomes more short of breath.  She has been using her oxygen during the day when she has shortness of breath intermittently. Still suspect weight, physical deconditioning and stiff heart muscle [diastolic dysfunction is the main reason for shortness of breath] as well as anxiety. Discussed deep breathing exercises with patient.   Patient instructions: Follow up with cardiology Will order home PT for deconditioning  Use flutter valve aand incentive spirometry and do each 5-10 times on the hour during the day Continue current medications Stay active  Follow up with Dr. Chase Caller in around 4 weeks at first available appointment

## 2018-04-18 NOTE — Patient Instructions (Addendum)
Follow up with cardiology Will order home PT for deconditioning  Use flutter valve aand incentive spirometry and do each 5-10 times on the hour during the day Continue current medications Stay active  Follow up with Dr. Chase Caller in around 4 weeks at first available appointment

## 2018-04-18 NOTE — Telephone Encounter (Signed)
Called and spoke with pt in regards to PR, pt stated she is not able to leave her house and thought the doctor was going to put a referral in for someone to come to her house. Adv pt our program she would have to come to our department, pt stated she will contact her doctor.  Closed referral

## 2018-04-18 NOTE — Progress Notes (Addendum)
Virtual Visit via Telephone Note  I connected with [name] [name] on 04/18/18 at  1:30 PM EDT by telephone and verified that I am speaking with the correct person using two identifiers.   I discussed the limitations, risks, security and privacy concerns of performing an evaluation and management service by telephone and the availability of in person appointments. I also discussed with the patient that there may be a patient responsible charge related to this service. The patient expressed understanding and agreed to proceed.   History of Present Illness: 84 year old female never smoker with dyspnea who is followed by Dr. Chase [name].  Patient still visit today for follow-up.  She was last seen by me on 03/19/2018.  She states that she is still having ongoing issues with shortness of breath with exertion.  She is wearing O2 at night as advised after overnight O2 study.  She states that she does check her oxygen level during the day and it is staying above 93% on room air.  She has not started home PT yet.  She does get anxious at times and in turn becomes more short of breath.  She has been using her oxygen during the day when she has shortness of breath intermittently.  Denies f/c/s, n/v/d, hemoptysis, PND, leg swelling.    Observations/Objective: HRCT 03/13/18 - No definitive evidence of interstitial lung disease. Air trapping is indicative of small airways disease.  Acetylocholine Receptor Ab, All 03/07/18 - WNL  Thyroid Function studies January 2020 normal  PFT Results Latest Ref Rng & Units 08/07/2015  FVC-Pre L 2.98  FVC-Predicted Pre % 102  FVC-Post L 3.01  FVC-Predicted Post % 102  Pre FEV1/FVC % % 73  Post FEV1/FCV % % 80  FEV1-Pre L 2.19  FEV1-Predicted Pre % 100  FEV1-Post L 2.40  DLCO UNC% % 70  DLCO COR %Predicted % 77  TLC L 5.53  TLC % Predicted % 103  RV % Predicted % 120     Assessment and Plan: Discussion: Patient states that she is still having ongoing issues  with shortness of breath with exertion.  She is wearing O2 at night as advised after overnight O2 study.  She states that she does check her oxygen level during the day and it is staying above 93% on room air.  She has not started home PT yet.  She does get anxious at times and in turn becomes more short of breath.  She has been using her oxygen during the day when she has shortness of breath intermittently. Still suspect weight, physical deconditioning and stiff heart muscle [diastolic dysfunction is the main reason for shortness of breath] as well as anxiety. Discussed deep breathing exercises with patient.   Patient instructions: Follow up with cardiology Will order home PT for deconditioning  Use flutter valve aand incentive spirometry and do each 5-10 times on the hour during the day Continue current medications Stay active    Follow Up Instructions:  Follow up with Dr. Chase [name] in around 4 months at first available appointment   I discussed the assessment and treatment plan with the patient. The patient was provided an opportunity to ask questions and all were answered. The patient agreed with the plan and demonstrated an understanding of the instructions.   The patient was advised to call back or seek an in-person evaluation if the symptoms worsen or if the condition fails to improve as anticipated.  I provided 22 minutes of non-face-to-face time during this encounter.   Kenney Houseman  [name], NP

## 2018-04-23 ENCOUNTER — Telehealth: Payer: Self-pay | Admitting: Internal Medicine

## 2018-04-23 NOTE — Telephone Encounter (Signed)
Spoke to [place] and relayed that the note was updated.  Nothing further is needed.

## 2018-04-23 NOTE — Telephone Encounter (Signed)
I addended the note. Thanks.

## 2018-04-23 NOTE — Telephone Encounter (Signed)
Spoke with Bethanne Ginger, she needs the notes addended in regards to the flutter valve from the visit on 03/19/2018. She needs the note to say that the flutter valve is used to clear secretions. Tonya please advise.   Patient Instructions by Fenton Foy, NP at 03/19/2018 1:30 PM  Author: Fenton Foy, NP Author Type: Nurse Practitioner Filed: 03/19/2018 2:30 PM  Note Status: Signed Cosign: Cosign Not Required Encounter Date: 03/19/2018  Editor: Fenton Foy, NP (Nurse Practitioner)    Discussed recent tests with patient in office today Will order home PT for deconditioning  Continue current medications Stay active Follow up with Dr. Chase Caller in around 4 weeks at first available appointment

## 2018-04-24 ENCOUNTER — Ambulatory Visit (INDEPENDENT_AMBULATORY_CARE_PROVIDER_SITE_OTHER): Payer: Medicare Other | Admitting: Family Medicine

## 2018-04-24 ENCOUNTER — Encounter: Payer: Self-pay | Admitting: Family Medicine

## 2018-04-24 VITALS — BP 115/62 | HR 70

## 2018-04-24 DIAGNOSIS — M5136 Other intervertebral disc degeneration, lumbar region: Secondary | ICD-10-CM

## 2018-04-24 MED ORDER — PREDNISONE 10 MG PO TABS: ORAL_TABLET | ORAL | 0 refills | Status: DC

## 2018-04-24 MED ORDER — CYCLOBENZAPRINE HCL 10 MG PO TABS: 10.0000 mg | ORAL_TABLET | Freq: Three times a day (TID) | ORAL | 0 refills | Status: DC | PRN

## 2018-04-24 NOTE — Assessment & Plan Note (Addendum)
With chronic back pain  Currently taking norco 10-325 every 4 hours as tolerated (caution) Also methocarbamol-not helping  Pain sounds muscular as well/positional and started after she began a walking program She continues to alt heat and ice Has help from her children She has an appt with Dr Nelva Bush (ortho)on 4/28 Will try a course of prednisone (30 mg) -disc side eff and also flexeril (instead of methocarbamol)-disc sedation  She will update  Lidocaine patch may also be an option later on  ? If will also need pain clinic Apprehensive to ref to PT outpt given the covid scare right now  inst her to update [place] if any worsening of symptoms or any neuro changes

## 2018-04-24 NOTE — Progress Notes (Signed)
Virtual Visit via Video Note  I connected with [name] [name] on 04/24/18 at  2:30 PM EDT by a video enabled telemedicine application and verified that I am speaking with the correct person using two identifiers. The patient is in her home today  I am in my office    I discussed the limitations of evaluation and management by telemedicine and the availability of in person appointments. The patient expressed understanding and agreed to proceed.  History of Present Illness: Chronic back pain   H/o lumbar spondylosis with myelopathy Spondylolisthesis of lumbar region  Was stable per Dr Rex Kras note in January He retired since then  Last px from him was for norco 10-325 mg 1 po Q 8 hours prn pain 02/12/18 Taking every 4 hours instead of every 8 hours  Also methocarbamol 500 mg -was taking every 4 hours  Not touching it pain control wise  Her back pain is worse for over a week Severe Hard to get up and down  L side low back (L4-5 area)  Fairly unbearable   Has had surgery in the past  Reviewed some of her neuro surg records today  More surgery was not recommended at her last visit with Dr Carolynn Serve if she would benefit from injections   Rotates heat and ice   Had lidocaine patch years ago from another doctor  Cannot take nsaids   This may have been from walking for 3 days   Not going down her legs this time   She takes lasix Her vitals are stable On 02 at night - working up her sob with cardiology and pulmonology  Review of Systems  Constitutional: Negative for chills, fever, malaise/fatigue and weight loss.  Eyes: Negative for blurred vision.  Respiratory: Positive for shortness of breath. Negative for cough and wheezing.        Baseline sob on exertion   Cardiovascular: Negative for chest pain and palpitations.  Gastrointestinal: Negative for nausea and vomiting.  Genitourinary: Negative for dysuria and frequency.  Musculoskeletal: Positive for back pain.  Skin:  Negative for rash.  Neurological: Negative for headaches.      Patient Active Problem List   Diagnosis Date Noted  . Physical deconditioning 03/19/2018  . Chronic diastolic heart failure ([place]) 02/09/2018  . Prediabetes 01/26/2017  . Hearing loss 01/19/2016  . Carpal tunnel syndrome 08/31/2015  . Encounter for Medicare annual wellness exam 06/04/2014  . Obesity 06/04/2014  . Estrogen deficiency 06/04/2014  . Colon cancer screening 06/04/2014  . Schatzki's ring 03/27/2014  . OA (osteoarthritis) of knee 08/16/2011  . Hemorrhoids 08/10/2011  . Other dyspnea and respiratory abnormality 06/21/2010  . Urinary incontinence 05/27/2010  . OVERACTIVE BLADDER 03/12/2010  . Lumbar degenerative disc disease 04/13/2009  . GERD 10/15/2008  . CEREBROVASCULAR ACCIDENT, HX OF 10/15/2008  . ARTHRITIS, GENERALIZED 06/26/2008  . EXTERNAL HEMORRHOIDS 02/01/2008  . HIATAL HERNIA 02/01/2008  . DIVERTICULOSIS OF COLON 02/01/2008  . GASTRITIS, HX OF 02/01/2008  . DYSPNEA 09/19/2007  . Vitamin D deficiency 07/03/2007  . Osteoporosis of lumbar spine 06/13/2007  . Fatigue 06/13/2007  . HELICOBACTER PYLORI INFECTION 06/06/2006  . Hyperlipidemia 06/06/2006  . Generalized anxiety disorder 06/06/2006  . Adjustment disorder with mixed anxiety and depressed mood 06/06/2006  . MACULAR DEGENERATION 06/06/2006  . H/O: CVA (cerebrovascular accident) 06/06/2006  . OSTEOARTHRITIS 06/06/2006   Past Medical History:  Diagnosis Date  . Alternating constipation and diarrhea   . Anxiety state, unspecified   . Complication of anesthesia  slow to wake up with surgery several years ago   . Depressive disorder, not elsewhere classified   . Diaphragmatic hernia without mention of obstruction or gangrene   . Difficulty sleeping   . Diverticulosis of colon (without mention of hemorrhage)   . Esophageal reflux   . External hemorrhoids without mention of complication   . Fatigue   . Frequency of urination   .  Helicobacter pylori (H. pylori)   . Hiatal hernia   . History of transfusion   . Lumbar spondylosis   . Macular degeneration (senile) of retina, unspecified   . Osteoarthrosis, unspecified whether generalized or localized, unspecified site 08-11-11   hx. "rhematoid arthritis", osteoarthritis, DDD, bursitis(hip)  . Osteoporosis   . Palpitations   . PONV (postoperative nausea and vomiting)   . Schatzki's ring   . Shortness of breath 08-11-11   with exertion only at present  . Unspecified cerebral artery occlusion with cerebral infarction 08-11-11   '97/ '06( TIA)-affected lt. side, slight weakness L side  . Unspecified vitamin D deficiency    Past Surgical History:  Procedure Laterality Date  . ABDOMINAL HYSTERECTOMY  08-11-11  . APPENDECTOMY    . BACK SURGERY  08-11-11   '11-hx. lumbar fusion with retained hardware  . CATARACT EXTRACTION  08-11-11   Bilateral  . ESOPHAGEAL DILATION    . KNEE SURGERY  08-11-11   rt. knee scope  . PATELLAR TENDON REPAIR Left 02/22/2014   Procedure: PATELLA TENDON REPAIR;  Surgeon: Gearlean Alf, MD;  Location: [facility];  Service: Orthopedics;  Laterality: Left;  . PATELLAR TENDON REPAIR Left 06/25/2014   Procedure: LEFT PATELLA TENDON REPAIR;  Surgeon: Gaynelle Arabian, MD;  Location: [facility];  Service: Orthopedics;  Laterality: Left;  . TOTAL KNEE ARTHROPLASTY  08/16/2011   Procedure: TOTAL KNEE ARTHROPLASTY;  Surgeon: Gearlean Alf, MD;  Location: [facility];  Service: Orthopedics;  Laterality: Right;  . TOTAL KNEE ARTHROPLASTY Left 01/27/2014   Procedure: LEFT TOTAL KNEE ARTHROPLASTY;  Surgeon: Gearlean Alf, MD;  Location: [facility];  Service: Orthopedics;  Laterality: Left;  . TUBAL LIGATION     Social History   Tobacco Use  . Smoking status: Never Smoker  . Smokeless tobacco: Never Used  Substance Use Topics  . Alcohol use: No    Alcohol/week: 0.0 standard drinks  . Drug use: No   Family History  Problem Relation Age of Onset  . Heart disease Father    . Coronary artery disease Brother   . Diabetes Brother   . Heart disease Brother   . Pancreatic cancer Daughter   . Diabetes Sister   . Colon cancer Neg Hx   . Colon polyps Neg Hx   . Esophageal cancer Neg Hx   . Gallbladder disease Neg Hx    Allergies  Allergen Reactions  . Alendronate Sodium     REACTION: JAW PAIN  . Alendronate Sodium Other (See Comments)    REACTION: JAW PAIN  . Celecoxib Other (See Comments)    REACTION: GI UPSET REACTION: GI UPSET  . Nexium [Esomeprazole Magnesium] Nausea And Vomiting    Pt cannot take the generic Does fine with DAW Pt cannot take the generic Does fine with DAW  . Omeprazole Nausea Only  . Protonix [Pantoprazole Sodium] Nausea And Vomiting  . Rabeprazole Nausea And Vomiting  . Latex Itching and Rash   Current Outpatient Medications on File Prior to Visit  Medication Sig Dispense Refill  . acetaminophen (TYLENOL) 325 MG  tablet Take 2 tablets (650 mg total) by mouth every 6 (six) hours as needed for mild pain (or Fever >/= 101). 40 tablet 0  . ALPRAZolam (XANAX) 0.5 MG tablet Take 1 tablet (0.5 mg total) by mouth at bedtime as needed. for sleep 90 tablet 1  . atorvastatin (LIPITOR) 10 MG tablet Take 1 tablet (10 mg total) by mouth daily. 90 tablet 3  . calcium carbonate (TUMS - DOSED IN MG ELEMENTAL CALCIUM) 500 MG chewable tablet Chew 1 tablet (200 mg of elemental calcium total) by mouth 4 (four) times daily as needed for indigestion or heartburn. 10 tablet 0  . clopidogrel (PLAVIX) 75 MG tablet TAKE 1 TABLET DAILY 90 tablet 1  . diclofenac sodium (VOLTAREN) 1 % GEL Apply 1 application topically 2 (two) times daily.    Marland Kitchen esomeprazole (NEXIUM) 40 MG capsule Take 1 capsule (40 mg total) by mouth 2 (two) times daily before a meal. 180 capsule 3  . fluticasone (FLONASE) 50 MCG/ACT nasal spray Place 1-2 sprays into both nostrils daily as needed for allergies or rhinitis.    . furosemide (LASIX) 20 MG tablet Take 1 tablet (20 mg total) by  mouth daily. 90 tablet 3  . HYDROcodone-acetaminophen (NORCO) 10-325 MG tablet Take 1 tablet by mouth every 6 (six) hours as needed.   0  . metoprolol succinate (TOPROL XL) 25 MG 24 hr tablet TAKE ONE-HALF (1/2) TABLET AT BEDTIME 45 tablet 2  . Multiple Vitamins-Minerals (PRESERVISION AREDS PO) Take 1 capsule by mouth 2 (two) times daily.    Vladimir Faster Glycol-Propyl Glycol (SYSTANE OP) Apply 1-2 drops to eye at bedtime as needed (dry eyes.).    Marland Kitchen sertraline (ZOLOFT) 100 MG tablet Take 2 tablets (200 mg total) by mouth daily. (Patient taking differently: Take 100 mg by mouth 2 (two) times daily. ) 180 tablet 3   No current facility-administered medications on file prior to visit.     Observations/Objective: Pt appears well  Talkative and mentally sharp Visibly uncomfortable when shifting in chair  No cough or hoarseness  Mood is good   Assessment and Plan: Problem List Items Addressed This Visit      Musculoskeletal and Integument   Lumbar degenerative disc disease - Primary    With chronic back pain  Currently taking norco 10-325 every 4 hours as tolerated (caution) Also methocarbamol-not helping  Pain sounds muscular as well/positional and started after she began a walking program She continues to alt heat and ice Has help from her children She has an appt with Dr Nelva Bush (ortho)on 4/28 Will try a course of prednisone (30 mg) -disc side eff and also flexeril (instead of methocarbamol)-disc sedation  She will update  Lidocaine patch may also be an option later on  ? If will also need pain clinic Apprehensive to ref to PT outpt given the covid scare right now  inst her to update [place] if any worsening of symptoms or any neuro changes      Relevant Medications   predniSONE (DELTASONE) 10 MG tablet   cyclobenzaprine (FLEXERIL) 10 MG tablet       Follow Up Instructions: Take the prednisone taper as directed  Also try flexeril instead of robaxin  Continue norco only when  needed Continue heat Slow walking is good if able  If no improvement - let us know  F/u with orthopedics later this month as planned     I discussed the assessment and treatment plan with the patient. The patient was provided  an opportunity to ask questions and all were answered. The patient agreed with the plan and demonstrated an understanding of the instructions.   The patient was advised to call back or seek an in-person evaluation if the symptoms worsen or if the condition fails to improve as anticipated.     Gwendolin Briel, MD   

## 2018-04-27 ENCOUNTER — Telehealth: Payer: Self-pay | Admitting: Family Medicine

## 2018-04-27 NOTE — Telephone Encounter (Signed)
Pt called and wanted to know if she can lidocaine patches rx called in for her back pain . Please advise

## 2018-04-30 ENCOUNTER — Telehealth: Payer: Self-pay | Admitting: Family Medicine

## 2018-04-30 ENCOUNTER — Encounter: Payer: Self-pay | Admitting: Family Medicine

## 2018-04-30 ENCOUNTER — Ambulatory Visit (INDEPENDENT_AMBULATORY_CARE_PROVIDER_SITE_OTHER): Payer: Medicare Other | Admitting: Family Medicine

## 2018-04-30 DIAGNOSIS — M5136 Other intervertebral disc degeneration, lumbar region: Secondary | ICD-10-CM | POA: Diagnosis not present

## 2018-04-30 MED ORDER — LIDOCAINE 4 % EX PTCH: 1.0000 | MEDICATED_PATCH | Freq: Every day | CUTANEOUS | 1 refills | Status: DC

## 2018-04-30 NOTE — Progress Notes (Signed)
Virtual Visit via Video Note  I connected with [name] [name] on 04/30/18 at  2:30 PM EDT by a video enabled telemedicine application and verified that I am speaking with the correct person using two identifiers. She is at home today  I am in my office    I discussed the limitations of evaluation and management by telemedicine and the availability of in person appointments. The patient expressed understanding and agreed to proceed.  History of Present Illness: Back pain  Still suffering with lumbar spondylosis/ myelopathy and more pain  She is currently waiting for an orthopedic visit (Dr Nelva Bush 4/28) They have repeatedly called that office and are not able to get through  Left side of her back is severe/pain -much much worse Now it is radiating down to her left hip (cannot take a step) -does not feel focal weakness, just pain  Flexeril and prednisone did not touch it  Cannot get up and down   Last visit she was px prednisone -2 pills left She is taking her norco every 4 hours (prev from Dr Carloyn Manner)  Uses voltaren gel on back   She can raise her toe (no foot drop)  No change in bowel or bladder control    Taking norco every 4 hours  Interested in lidocaine patch if it would be safe  She thinks this is emergent and cannot get through to the ortho office (she will be a new patient) Thinks she would be appropriate for ED at this time Had also thought to see if the orthopedic urgent care on [address] is open   Per daughter "screaming and crying" in pain  They want to avoid the ER     Review of Systems  Constitutional: Negative for chills, fever and malaise/fatigue.  Respiratory: Negative for cough and shortness of breath.   Cardiovascular: Negative for chest pain, palpitations and leg swelling.  Gastrointestinal: Negative for abdominal pain and nausea.  Genitourinary: Negative for dysuria.  Musculoskeletal: Positive for back pain.  Skin: Negative for rash.  Neurological:  Negative for dizziness, sensory change, focal weakness and headaches.    Patient Active Problem List   Diagnosis Date Noted  . Physical deconditioning 03/19/2018  . Chronic diastolic heart failure ([place]) 02/09/2018  . Prediabetes 01/26/2017  . Hearing loss 01/19/2016  . Carpal tunnel syndrome 08/31/2015  . Encounter for Medicare annual wellness exam 06/04/2014  . Obesity 06/04/2014  . Estrogen deficiency 06/04/2014  . Colon cancer screening 06/04/2014  . Schatzki's ring 03/27/2014  . OA (osteoarthritis) of knee 08/16/2011  . Hemorrhoids 08/10/2011  . Other dyspnea and respiratory abnormality 06/21/2010  . Urinary incontinence 05/27/2010  . OVERACTIVE BLADDER 03/12/2010  . Lumbar degenerative disc disease 04/13/2009  . GERD 10/15/2008  . CEREBROVASCULAR ACCIDENT, HX OF 10/15/2008  . ARTHRITIS, GENERALIZED 06/26/2008  . EXTERNAL HEMORRHOIDS 02/01/2008  . HIATAL HERNIA 02/01/2008  . DIVERTICULOSIS OF COLON 02/01/2008  . GASTRITIS, HX OF 02/01/2008  . DYSPNEA 09/19/2007  . Vitamin D deficiency 07/03/2007  . Osteoporosis of lumbar spine 06/13/2007  . Fatigue 06/13/2007  . HELICOBACTER PYLORI INFECTION 06/06/2006  . Hyperlipidemia 06/06/2006  . Generalized anxiety disorder 06/06/2006  . Adjustment disorder with mixed anxiety and depressed mood 06/06/2006  . MACULAR DEGENERATION 06/06/2006  . H/O: CVA (cerebrovascular accident) 06/06/2006  . OSTEOARTHRITIS 06/06/2006   Past Medical History:  Diagnosis Date  . Alternating constipation and diarrhea   . Anxiety state, unspecified   . Complication of anesthesia    slow to wake  up with surgery several years ago   . Depressive disorder, not elsewhere classified   . Diaphragmatic hernia without mention of obstruction or gangrene   . Difficulty sleeping   . Diverticulosis of colon (without mention of hemorrhage)   . Esophageal reflux   . External hemorrhoids without mention of complication   . Fatigue   . Frequency of urination    . Helicobacter pylori (H. pylori)   . Hiatal hernia   . History of transfusion   . Lumbar spondylosis   . Macular degeneration (senile) of retina, unspecified   . Osteoarthrosis, unspecified whether generalized or localized, unspecified site 08-11-11   hx. "rhematoid arthritis", osteoarthritis, DDD, bursitis(hip)  . Osteoporosis   . Palpitations   . PONV (postoperative nausea and vomiting)   . Schatzki's ring   . Shortness of breath 08-11-11   with exertion only at present  . Unspecified cerebral artery occlusion with cerebral infarction 08-11-11   '97/ '06( TIA)-affected lt. side, slight weakness L side  . Unspecified vitamin D deficiency    Past Surgical History:  Procedure Laterality Date  . ABDOMINAL HYSTERECTOMY  08-11-11  . APPENDECTOMY    . BACK SURGERY  08-11-11   '11-hx. lumbar fusion with retained hardware  . CATARACT EXTRACTION  08-11-11   Bilateral  . ESOPHAGEAL DILATION    . KNEE SURGERY  08-11-11   rt. knee scope  . PATELLAR TENDON REPAIR Left 02/22/2014   Procedure: PATELLA TENDON REPAIR;  Surgeon: Gearlean Alf, MD;  Location: [facility];  Service: Orthopedics;  Laterality: Left;  . PATELLAR TENDON REPAIR Left 06/25/2014   Procedure: LEFT PATELLA TENDON REPAIR;  Surgeon: Gaynelle Arabian, MD;  Location: [facility];  Service: Orthopedics;  Laterality: Left;  . TOTAL KNEE ARTHROPLASTY  08/16/2011   Procedure: TOTAL KNEE ARTHROPLASTY;  Surgeon: Gearlean Alf, MD;  Location: [facility];  Service: Orthopedics;  Laterality: Right;  . TOTAL KNEE ARTHROPLASTY Left 01/27/2014   Procedure: LEFT TOTAL KNEE ARTHROPLASTY;  Surgeon: Gearlean Alf, MD;  Location: [facility];  Service: Orthopedics;  Laterality: Left;  . TUBAL LIGATION     Social History   Tobacco Use  . Smoking status: Never Smoker  . Smokeless tobacco: Never Used  Substance Use Topics  . Alcohol use: No    Alcohol/week: 0.0 standard drinks  . Drug use: No   Family History  Problem Relation Age of Onset  . Heart disease  Father   . Coronary artery disease Brother   . Diabetes Brother   . Heart disease Brother   . Pancreatic cancer Daughter   . Diabetes Sister   . Colon cancer Neg Hx   . Colon polyps Neg Hx   . Esophageal cancer Neg Hx   . Gallbladder disease Neg Hx    Allergies  Allergen Reactions  . Alendronate Sodium     REACTION: JAW PAIN  . Alendronate Sodium Other (See Comments)    REACTION: JAW PAIN  . Celecoxib Other (See Comments)    REACTION: GI UPSET REACTION: GI UPSET  . Nexium [Esomeprazole Magnesium] Nausea And Vomiting    Pt cannot take the generic Does fine with DAW Pt cannot take the generic Does fine with DAW  . Omeprazole Nausea Only  . Protonix [Pantoprazole Sodium] Nausea And Vomiting  . Rabeprazole Nausea And Vomiting  . Latex Itching and Rash   Current Outpatient Medications on File Prior to Visit  Medication Sig Dispense Refill  . acetaminophen (TYLENOL) 325 MG tablet Take 2  tablets (650 mg total) by mouth every 6 (six) hours as needed for mild pain (or Fever >/= 101). 40 tablet 0  . ALPRAZolam (XANAX) 0.5 MG tablet Take 1 tablet (0.5 mg total) by mouth at bedtime as needed. for sleep 90 tablet 1  . atorvastatin (LIPITOR) 10 MG tablet Take 1 tablet (10 mg total) by mouth daily. 90 tablet 3  . calcium carbonate (TUMS - DOSED IN MG ELEMENTAL CALCIUM) 500 MG chewable tablet Chew 1 tablet (200 mg of elemental calcium total) by mouth 4 (four) times daily as needed for indigestion or heartburn. 10 tablet 0  . clopidogrel (PLAVIX) 75 MG tablet TAKE 1 TABLET DAILY 90 tablet 1  . cyclobenzaprine (FLEXERIL) 10 MG tablet Take 1 tablet (10 mg total) by mouth 3 (three) times daily as needed for muscle spasms. 30 tablet 0  . diclofenac sodium (VOLTAREN) 1 % GEL Apply 1 application topically 2 (two) times daily.    Marland Kitchen esomeprazole (NEXIUM) 40 MG capsule Take 1 capsule (40 mg total) by mouth 2 (two) times daily before a meal. 180 capsule 3  . fluticasone (FLONASE) 50 MCG/ACT nasal spray  Place 1-2 sprays into both nostrils daily as needed for allergies or rhinitis.    . furosemide (LASIX) 20 MG tablet Take 1 tablet (20 mg total) by mouth daily. 90 tablet 3  . HYDROcodone-acetaminophen (NORCO) 10-325 MG tablet Take 1 tablet by mouth every 6 (six) hours as needed.   0  . metoprolol succinate (TOPROL XL) 25 MG 24 hr tablet TAKE ONE-HALF (1/2) TABLET AT BEDTIME 45 tablet 2  . Multiple Vitamins-Minerals (PRESERVISION AREDS PO) Take 1 capsule by mouth 2 (two) times daily.    Vladimir Faster Glycol-Propyl Glycol (SYSTANE OP) Apply 1-2 drops to eye 3 (three) times daily as needed (dry eyes.).     Marland Kitchen predniSONE (DELTASONE) 10 MG tablet Take 3 pills once daily by mouth for 3 days, then 2 pills once daily for 3 days, then 1 pill once daily for 3 days and then stop 18 tablet 0  . sertraline (ZOLOFT) 100 MG tablet Take 2 tablets (200 mg total) by mouth daily. (Patient taking differently: Take 100 mg by mouth 2 (two) times daily. ) 180 tablet 3   No current facility-administered medications on file prior to visit.     Observations/Objective: Pt is somewhat distressed with pain  Does not seem over medicated/ is not sedated  Daughter helps with her hx - also very upset about her pain  When changing position in chair-pt winces from pain  Not tearful during interview No facial swelling or asymmetry  No cough or sob   Assessment and Plan:   Follow Up Instructions: Go ahead and head to [facility] ED for your severe back and hip pain  If the orthopedic urgent care is open that would also be an option  I will send in lidocaine patch to try as well  Please call us to check in re: how it goes  Do keep your appt. With Dr Nelva Bush on 4/28 as well     I discussed the assessment and treatment plan with the patient. The patient was provided an opportunity to ask questions and all were answered. The patient agreed with the plan and demonstrated an understanding of the instructions.   The patient was  advised to call back or seek an in-person evaluation if the symptoms worsen or if the condition fails to improve as anticipated.     Loura Pardon, MD

## 2018-04-30 NOTE — Telephone Encounter (Signed)
Pt has Doxy.me 04/30/18 at 2:30 with Dr Glori Bickers already scheduled.

## 2018-04-30 NOTE — Telephone Encounter (Signed)
Best number [phone] Pt called stating she just spoke with dr tower and dr tower recommended her to go er to get mri.  Pt stated she called [facility] and they didn't recommend pt coming in there because  they may or may not do a MRI.  The ER recommend that her PCP order an outpatient MRI for her back pain Pt wanted to know if she can have this done today!

## 2018-04-30 NOTE — Assessment & Plan Note (Signed)
With more pain recently (much worse than when she saw Dr Carloyn Manner)  She continues the norco every 4 hours  No improvement with prednisone or muscle relaxer Pain to get up and down (daughter is helping her)  At this point pt thinks her pain has reached the emergent point (but no focal weakness which is re assuring)  I do not think I can get her in with ortho (new pt appt is 4/28) any earlier  She plans to go to ER or the [facility] if it is open  We did send in lidocaine patches as req as well-has not tried them yet   Addend: Pt states she called the ER at [facility] and they told her not to come because they could not help her  Adv we go ahead and order stat MRI (this can be done if situation is emergent) This was ordered at 4 pm with hopes we may be able to get it done tomorrow (unsure)

## 2018-04-30 NOTE — Telephone Encounter (Signed)
I ordered an urgent MRI- hope we can get that done Will cc to [facility] as well  She is in severe pain and cannot see ortho until 4/28 -also cannot get through to their office

## 2018-05-01 ENCOUNTER — Other Ambulatory Visit: Payer: Self-pay | Admitting: Family Medicine

## 2018-05-01 ENCOUNTER — Other Ambulatory Visit: Payer: Self-pay

## 2018-05-01 ENCOUNTER — Ambulatory Visit (HOSPITAL_COMMUNITY)
Admission: RE | Admit: 2018-05-01 | Discharge: 2018-05-01 | Disposition: A | Discharge status: Home / Self Care | Payer: Medicare Other | Source: Ambulatory Visit | Attending: Family Medicine | Admitting: Family Medicine

## 2018-05-01 ENCOUNTER — Telehealth: Payer: Self-pay | Admitting: Family Medicine

## 2018-05-01 DIAGNOSIS — S32040A Wedge compression fracture of fourth lumbar vertebra, initial encounter for closed fracture: Secondary | ICD-10-CM | POA: Diagnosis not present

## 2018-05-01 DIAGNOSIS — M5136 Other intervertebral disc degeneration, lumbar region: Secondary | ICD-10-CM | POA: Insufficient documentation

## 2018-05-01 MED ORDER — CALCITONIN (SALMON) 200 UNIT/ACT NA SOLN: 1.0000 | Freq: Every day | NASAL | 0 refills | Status: DC

## 2018-05-01 MED ORDER — CYCLOBENZAPRINE HCL 10 MG PO TABS: 10.0000 mg | ORAL_TABLET | Freq: Three times a day (TID) | ORAL | 0 refills | Status: DC | PRN

## 2018-05-01 NOTE — Telephone Encounter (Signed)
MRI scheduled for today at 11:30am at [facility] Orthopedic appt moved up to Tuesday, April 21st at 1:45pm and patient notified.

## 2018-05-01 NOTE — Telephone Encounter (Signed)
Spoke to pt and her daughter re; MRI  No big changes from last one except some compression fx at L3 and L5 with minimal ht loss  This could be source of recent inc pain  No trauma she can remember Has finished course of evista for OP in the past and does not tolerate alendronate  We will need to discuss long term strategy for OP tx at next visit For now will px 1 mo of calcitonin ns for her pain  Also refill flexeril for spasm  She will f/u with Dr Nelva Bush Tuesday as planned   Unsure if she would be a candidate for vertebroplasty or kyphoplasty given her hx of fusion (also the minimal ht loss with her fractures)   inst to update [place] if any changes  She will continue ca and D and use great caution not to fall

## 2018-05-03 DIAGNOSIS — M4856XA Collapsed vertebra, not elsewhere classified, lumbar region, initial encounter for fracture: Secondary | ICD-10-CM | POA: Diagnosis not present

## 2018-05-03 DIAGNOSIS — M961 Postlaminectomy syndrome, not elsewhere classified: Secondary | ICD-10-CM | POA: Diagnosis not present

## 2018-05-11 DIAGNOSIS — M4856XA Collapsed vertebra, not elsewhere classified, lumbar region, initial encounter for fracture: Secondary | ICD-10-CM | POA: Diagnosis not present

## 2018-05-18 DIAGNOSIS — H353132 Nonexudative age-related macular degeneration, bilateral, intermediate dry stage: Secondary | ICD-10-CM | POA: Diagnosis not present

## 2018-05-23 ENCOUNTER — Other Ambulatory Visit: Payer: Self-pay | Admitting: Family Medicine

## 2018-05-24 NOTE — Telephone Encounter (Signed)
Last OV was 04/30/2018, last filled on 05/01/18 (see phone note), #30 tablets with 0 refills, please advise

## 2018-05-28 ENCOUNTER — Other Ambulatory Visit: Payer: Self-pay | Admitting: *Deleted

## 2018-05-28 MED ORDER — ALPRAZOLAM 0.5 MG PO TABS: 0.5000 mg | ORAL_TABLET | Freq: Every evening | ORAL | 0 refills | Status: DC | PRN

## 2018-05-28 NOTE — Telephone Encounter (Signed)
Name of Medication: xanax Name of Pharmacy: [facility] or Written Date and Quantity: 07/26/2016 #90 tabs with 1 refill Last Office Visit and Type: Back Pain on 04/30/2018 Next Office Visit and Type: CPE/AWV on 02/13/2019 Last Controlled Substance Agreement Date: 05/07/2013 Last UDS:05/07/2013

## 2018-06-02 ENCOUNTER — Other Ambulatory Visit: Payer: Self-pay | Admitting: Cardiovascular Disease

## 2018-06-27 ENCOUNTER — Encounter: Payer: Self-pay | Admitting: Family Medicine

## 2018-06-27 ENCOUNTER — Other Ambulatory Visit: Payer: Self-pay

## 2018-06-27 ENCOUNTER — Ambulatory Visit (INDEPENDENT_AMBULATORY_CARE_PROVIDER_SITE_OTHER): Payer: Medicare Other | Admitting: Family Medicine

## 2018-06-27 VITALS — BP 128/76 | HR 85 | Temp 99.2°F | Ht 64.0 in | Wt 193.4 lb

## 2018-06-27 DIAGNOSIS — F4323 Adjustment disorder with mixed anxiety and depressed mood: Secondary | ICD-10-CM | POA: Diagnosis not present

## 2018-06-27 DIAGNOSIS — M5136 Other intervertebral disc degeneration, lumbar region: Secondary | ICD-10-CM

## 2018-06-27 DIAGNOSIS — R5382 Chronic fatigue, unspecified: Secondary | ICD-10-CM

## 2018-06-27 DIAGNOSIS — R413 Other amnesia: Secondary | ICD-10-CM | POA: Diagnosis not present

## 2018-06-27 LAB — CBC WITH DIFFERENTIAL/PLATELET
Basophils Absolute: 0 K/uL (ref 0.0–0.1)
Basophils Relative: 0.9 % (ref 0.0–3.0)
Eosinophils Absolute: 0.1 K/uL (ref 0.0–0.7)
Eosinophils Relative: 1.9 % (ref 0.0–5.0)
HCT: 40.8 % (ref 36.0–46.0)
Hemoglobin: 13.6 g/dL (ref 12.0–15.0)
Lymphocytes Relative: 30.8 % (ref 12.0–46.0)
Lymphs Abs: 1.7 K/uL (ref 0.7–4.0)
MCHC: 33.5 g/dL (ref 30.0–36.0)
MCV: 87.4 fl (ref 78.0–100.0)
Monocytes Absolute: 0.5 K/uL (ref 0.1–1.0)
Monocytes Relative: 9.3 % (ref 3.0–12.0)
Neutro Abs: 3.2 K/uL (ref 1.4–7.7)
Neutrophils Relative %: 57.1 % (ref 43.0–77.0)
Platelets: 272 K/uL (ref 150.0–400.0)
RBC: 4.66 Mil/uL (ref 3.87–5.11)
RDW: 14.3 % (ref 11.5–15.5)
WBC: 5.5 K/uL (ref 4.0–10.5)

## 2018-06-27 LAB — SEDIMENTATION RATE: Sed Rate: 12 mm/h (ref 0–30)

## 2018-06-27 LAB — VITAMIN B12: Vitamin B-12: 1304 pg/mL — ABNORMAL HIGH (ref 211–911)

## 2018-06-27 LAB — TSH: TSH: 2.4 u[IU]/mL (ref 0.35–4.50)

## 2018-06-27 NOTE — Assessment & Plan Note (Signed)
Struggling  Continues zoloft  Also xanax (discussed risks with this) Is frustrated over inability to get things done -physically and mentally  Reviewed stressors/ coping techniques/symptoms/ support sources/ tx options and side effects in detail today  Offered counseling-she is considering that  Disc imp of self care and getting help with family
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
## 2018-06-27 NOTE — Assessment & Plan Note (Addendum)
Short term memory loss and slowed cognition may be worsened by both age and overmedication and chronic pain/stress  (and even more so anxiety and depression) No red flags for confusion Lab to look for other organic causes  Pt will d/w family and f/u for memory eval if she desires Mentally sharp today

## 2018-06-27 NOTE — Assessment & Plan Note (Signed)
Ongoing - with some sedating medication Asked her to consider stopping muscle relaxer if it is not helping her  Lab today

## 2018-06-27 NOTE — Assessment & Plan Note (Signed)
Chronic pain =taking norco from Dr Nelva Bush  He ref her to pain clinic in [facility] but that is too far for her to go  She will check in and see if he has other options Not a surgical candidate at this time Pain dose limit her mobility and activity No doubt norco and flexeril may impair memory as well

## 2018-06-27 NOTE — Patient Instructions (Signed)
Focus on decreasing your responsibilities Don't multi task  Set alarms on cell phone for everything you need   Take care of yourself  Minimize sedating medicine any time you can - xanax/flexeril/norco   Get help from family   Watch for confusion and getting lost in familiar places  Let's do some labs today   Talk to your family -if you want to come back for a memory test let me know   Talk to Dr Nelva Bush- ask him if there is a pain clinic he recommends that is not in [facility]   Let me know if you want to see a counselor for anxiety and depression please call and let me know

## 2018-06-29 ENCOUNTER — Other Ambulatory Visit: Payer: Self-pay | Admitting: Family Medicine

## 2018-06-29 ENCOUNTER — Ambulatory Visit: Payer: Medicare Other | Admitting: Cardiovascular Disease

## 2018-06-29 ENCOUNTER — Telehealth: Payer: Self-pay | Admitting: Family Medicine

## 2018-06-29 DIAGNOSIS — Z1231 Encounter for screening mammogram for malignant neoplasm of breast: Secondary | ICD-10-CM

## 2018-06-29 NOTE — Telephone Encounter (Signed)
Pt notified of lab results and Dr. Marliss Coots comments off of mychart. Pt is going to try and get back set up with her mychart she has their phone # and will call them with any questions

## 2018-06-29 NOTE — Telephone Encounter (Signed)
Best number [phone] Pt called to get lab results she stated she didn't know how to get into my chart

## 2018-07-15 DIAGNOSIS — H353132 Nonexudative age-related macular degeneration, bilateral, intermediate dry stage: Secondary | ICD-10-CM | POA: Diagnosis not present

## 2018-07-21 ENCOUNTER — Other Ambulatory Visit: Payer: Self-pay | Admitting: Family Medicine

## 2018-08-09 ENCOUNTER — Other Ambulatory Visit: Payer: Self-pay | Admitting: Cardiovascular Disease

## 2018-08-13 ENCOUNTER — Ambulatory Visit (INDEPENDENT_AMBULATORY_CARE_PROVIDER_SITE_OTHER): Payer: Medicare Other | Admitting: Family Medicine

## 2018-08-13 ENCOUNTER — Other Ambulatory Visit: Payer: Self-pay

## 2018-08-13 ENCOUNTER — Encounter: Payer: Self-pay | Admitting: Family Medicine

## 2018-08-13 VITALS — BP 128/68 | HR 81 | Temp 97.2°F | Ht 64.0 in | Wt 193.1 lb

## 2018-08-13 DIAGNOSIS — W19XXXA Unspecified fall, initial encounter: Secondary | ICD-10-CM | POA: Diagnosis not present

## 2018-08-13 DIAGNOSIS — R2689 Other abnormalities of gait and mobility: Secondary | ICD-10-CM | POA: Diagnosis not present

## 2018-08-13 DIAGNOSIS — Y92009 Unspecified place in unspecified non-institutional (private) residence as the place of occurrence of the external cause: Secondary | ICD-10-CM | POA: Diagnosis not present

## 2018-08-13 NOTE — Progress Notes (Signed)
Subjective:    Patient ID: [name] [name], female    DOB: April 23, 1934, 84 y.o.   MRN: [id number]  HPI Pt presents to discuss balance problems  She is interested in PT  She uses a walker (wheels/seat)  Fall hx-  Had a fall at 4 am - with walker (was stomping on a bug and then lost balance)  She could not get up  She was able to scoot to commode   She does do 3 lb weights for arm strength  Can't do much other exercise     BP Readings from Last 3 Encounters:  08/13/18 128/68  06/27/18 128/76  04/24/18 115/62   Pulse Readings from Last 3 Encounters:  08/13/18 81  06/27/18 85  04/24/18 70   Bruised arm and leg    She has chronic back and joint pain (sees orthopedics and neurosurgery) Knees really hurt also  Also h/o CVA in the past ([employer] distribution)  Dr Carloyn Manner retired  Saw Dr Rolena Infante and Dr Nelva Bush (who does her pain pills)    On med list Xanax at bedtime hydrododone form ortho for joint pain Both of these can affect balance and sedate   She will be moving in with son when he is finished building an addition  Patient Active Problem List   Diagnosis Date Noted  . Poor balance 08/13/2018  . Fall in home 08/13/2018  . Memory loss 06/27/2018  . Physical deconditioning 03/19/2018  . Chronic diastolic heart failure ([place]) 02/09/2018  . Prediabetes 01/26/2017  . Hearing loss 01/19/2016  . Carpal tunnel syndrome 08/31/2015  . Encounter for Medicare annual wellness exam 06/04/2014  . Obesity 06/04/2014  . Estrogen deficiency 06/04/2014  . Colon cancer screening 06/04/2014  . Schatzki's ring 03/27/2014  . OA (osteoarthritis) of knee 08/16/2011  . Hemorrhoids 08/10/2011  . Other dyspnea and respiratory abnormality 06/21/2010  . Urinary incontinence 05/27/2010  . OVERACTIVE BLADDER 03/12/2010  . Lumbar degenerative disc disease 04/13/2009  . GERD 10/15/2008  . CEREBROVASCULAR ACCIDENT, HX OF 10/15/2008  . ARTHRITIS, GENERALIZED 06/26/2008  . EXTERNAL HEMORRHOIDS  02/01/2008  . HIATAL HERNIA 02/01/2008  . DIVERTICULOSIS OF COLON 02/01/2008  . GASTRITIS, HX OF 02/01/2008  . DYSPNEA 09/19/2007  . Vitamin D deficiency 07/03/2007  . Osteoporosis of lumbar spine 06/13/2007  . Fatigue 06/13/2007  . HELICOBACTER PYLORI INFECTION 06/06/2006  . Hyperlipidemia 06/06/2006  . Generalized anxiety disorder 06/06/2006  . Adjustment disorder with mixed anxiety and depressed mood 06/06/2006  . MACULAR DEGENERATION 06/06/2006  . H/O: CVA (cerebrovascular accident) 06/06/2006  . OSTEOARTHRITIS 06/06/2006   Past Medical History:  Diagnosis Date  . Alternating constipation and diarrhea   . Anxiety state, unspecified   . Complication of anesthesia    slow to wake up with surgery several years ago   . Depressive disorder, not elsewhere classified   . Diaphragmatic hernia without mention of obstruction or gangrene   . Difficulty sleeping   . Diverticulosis of colon (without mention of hemorrhage)   . Esophageal reflux   . External hemorrhoids without mention of complication   . Fatigue   . Frequency of urination   . Helicobacter pylori (H. pylori)   . Hiatal hernia   . History of transfusion   . Lumbar spondylosis   . Macular degeneration (senile) of retina, unspecified   . Osteoarthrosis, unspecified whether generalized or localized, unspecified site 08-11-11   hx. "rhematoid arthritis", osteoarthritis, DDD, bursitis(hip)  . Osteoporosis   . Palpitations   . PONV (  postoperative nausea and vomiting)   . Schatzki's ring   . Shortness of breath 08-11-11   with exertion only at present  . Unspecified cerebral artery occlusion with cerebral infarction 08-11-11   '97/ '06( TIA)-affected lt. side, slight weakness L side  . Unspecified vitamin D deficiency    Past Surgical History:  Procedure Laterality Date  . ABDOMINAL HYSTERECTOMY  08-11-11  . APPENDECTOMY    . BACK SURGERY  08-11-11   '11-hx. lumbar fusion with retained hardware  . CATARACT EXTRACTION   08-11-11   Bilateral  . ESOPHAGEAL DILATION    . KNEE SURGERY  08-11-11   rt. knee scope  . PATELLAR TENDON REPAIR Left 02/22/2014   Procedure: PATELLA TENDON REPAIR;  Surgeon: Gearlean Alf, MD;  Location: [facility];  Service: Orthopedics;  Laterality: Left;  . PATELLAR TENDON REPAIR Left 06/25/2014   Procedure: LEFT PATELLA TENDON REPAIR;  Surgeon: Gaynelle Arabian, MD;  Location: [facility];  Service: Orthopedics;  Laterality: Left;  . TOTAL KNEE ARTHROPLASTY  08/16/2011   Procedure: TOTAL KNEE ARTHROPLASTY;  Surgeon: Gearlean Alf, MD;  Location: [facility];  Service: Orthopedics;  Laterality: Right;  . TOTAL KNEE ARTHROPLASTY Left 01/27/2014   Procedure: LEFT TOTAL KNEE ARTHROPLASTY;  Surgeon: Gearlean Alf, MD;  Location: [facility];  Service: Orthopedics;  Laterality: Left;  . TUBAL LIGATION     Social History   Tobacco Use  . Smoking status: Never Smoker  . Smokeless tobacco: Never Used  Substance Use Topics  . Alcohol use: No    Alcohol/week: 0.0 standard drinks  . Drug use: No   Family History  Problem Relation Age of Onset  . Heart disease Father   . Coronary artery disease Brother   . Diabetes Brother   . Heart disease Brother   . Pancreatic cancer Daughter   . Diabetes Sister   . Colon cancer Neg Hx   . Colon polyps Neg Hx   . Esophageal cancer Neg Hx   . Gallbladder disease Neg Hx    Allergies  Allergen Reactions  . Alendronate Sodium     REACTION: JAW PAIN  . Alendronate Sodium Other (See Comments)    REACTION: JAW PAIN  . Celecoxib Other (See Comments)    REACTION: GI UPSET REACTION: GI UPSET  . Nexium [Esomeprazole Magnesium] Nausea And Vomiting    Pt cannot take the generic Does fine with DAW Pt cannot take the generic Does fine with DAW  . Omeprazole Nausea Only  . Protonix [Pantoprazole Sodium] Nausea And Vomiting  . Rabeprazole Nausea And Vomiting  . Latex Itching and Rash   Current Outpatient Medications on File Prior to Visit  Medication Sig Dispense  Refill  . acetaminophen (TYLENOL) 325 MG tablet Take 2 tablets (650 mg total) by mouth every 6 (six) hours as needed for mild pain (or Fever >/= 101). 40 tablet 0  . ALPRAZolam (XANAX) 0.5 MG tablet Take 1 tablet (0.5 mg total) by mouth at bedtime as needed. for sleep 90 tablet 0  . atorvastatin (LIPITOR) 10 MG tablet Take 1 tablet (10 mg total) by mouth daily. 90 tablet 3  . calcitonin, salmon, (MIACALCIN/FORTICAL) 200 UNIT/ACT nasal spray Place 1 spray into alternate nostrils daily. 3.7 mL 0  . calcium carbonate (TUMS - DOSED IN MG ELEMENTAL CALCIUM) 500 MG chewable tablet Chew 1 tablet (200 mg of elemental calcium total) by mouth 4 (four) times daily as needed for indigestion or heartburn. 10 tablet 0  . clopidogrel (PLAVIX)  75 MG tablet TAKE 1 TABLET DAILY 90 tablet 1  . cyclobenzaprine (FLEXERIL) 10 MG tablet TAKE 1 TABLET(10 MG) BY MOUTH THREE TIMES DAILY AS NEEDED FOR MUSCLE SPASMS 30 tablet 1  . diclofenac sodium (VOLTAREN) 1 % GEL Apply 1 application topically 2 (two) times daily.    Marland Kitchen esomeprazole (NEXIUM) 40 MG capsule Take 1 capsule (40 mg total) by mouth 2 (two) times daily before a meal. 180 capsule 3  . fluticasone (FLONASE) 50 MCG/ACT nasal spray Place 1-2 sprays into both nostrils daily as needed for allergies or rhinitis.    Marland Kitchen HYDROcodone-acetaminophen (NORCO) 10-325 MG tablet Take 1 tablet by mouth every 6 (six) hours as needed.   0  . metoprolol succinate (TOPROL XL) 25 MG 24 hr tablet TAKE ONE-HALF (1/2) TABLET AT BEDTIME 45 tablet 1  . Multiple Vitamins-Minerals (PRESERVISION AREDS PO) Take 1 capsule by mouth 2 (two) times daily.    Vladimir Faster Glycol-Propyl Glycol (SYSTANE OP) Apply 1-2 drops to eye 3 (three) times daily as needed (dry eyes.).     Marland Kitchen predniSONE (DELTASONE) 10 MG tablet Take 3 pills once daily by mouth for 3 days, then 2 pills once daily for 3 days, then 1 pill once daily for 3 days and then stop 18 tablet 0  . sertraline (ZOLOFT) 100 MG tablet Take 2 tablets  (200 mg total) by mouth daily. (Patient taking differently: Take 100 mg by mouth 2 (two) times daily. ) 180 tablet 3  . Wheat Dextrin (BENEFIBER PO) Take by mouth daily.    . furosemide (LASIX) 20 MG tablet Take 1 tablet (20 mg total) by mouth daily. 90 tablet 3   No current facility-administered medications on file prior to visit.     Review of Systems  Constitutional: Positive for fatigue. Negative for activity change, appetite change, fever and unexpected weight change.  HENT: Negative for congestion, ear pain, rhinorrhea, sinus pressure and sore throat.   Eyes: Negative for pain, redness and visual disturbance.  Respiratory: Negative for cough, shortness of breath and wheezing.   Cardiovascular: Negative for chest pain and palpitations.  Gastrointestinal: Negative for abdominal pain, blood in stool, constipation and diarrhea.  Endocrine: Negative for polydipsia and polyuria.  Genitourinary: Negative for dysuria, frequency and urgency.  Musculoskeletal: Positive for arthralgias, back pain and gait problem. Negative for joint swelling and myalgias.  Skin: Negative for pallor and rash.  Allergic/Immunologic: Negative for environmental allergies.  Neurological: Positive for weakness. Negative for dizziness, syncope, facial asymmetry and headaches.       Poor balance  Not dizzy  Hematological: Negative for adenopathy. Does not bruise/bleed easily.  Psychiatric/Behavioral: Negative for decreased concentration and dysphoric mood. The patient is not nervous/anxious.        Objective:   Physical Exam Constitutional:      General: She is not in acute distress.    Appearance: Normal appearance. She is obese. She is not ill-appearing.     Comments: Frail appearing   HENT:     Head: Normocephalic and atraumatic.     Mouth/Throat:     Mouth: Mucous membranes are moist.  Eyes:     General: No scleral icterus.    Extraocular Movements: Extraocular movements intact.     Conjunctiva/sclera:  Conjunctivae normal.     Pupils: Pupils are equal, round, and reactive to light.     Comments: No nystagmus   Neck:     Musculoskeletal: Normal range of motion and neck supple. No muscular tenderness.  Vascular: No carotid bruit.  Cardiovascular:     Rate and Rhythm: Normal rate and regular rhythm.     Heart sounds: Normal heart sounds.  Pulmonary:     Effort: Pulmonary effort is normal. No respiratory distress.     Breath sounds: Normal breath sounds. No wheezing or rales.  Musculoskeletal:        General: Tenderness present. No signs of injury.     Right lower leg: No edema.     Left lower leg: No edema.     Comments: Slow but steady gait with wheeled walker (she does shuffle)  No bradykinesia  Cannot stand from chair w/o using arms   Poor rom of knees and LS  Lymphadenopathy:     Cervical: No cervical adenopathy.  Skin:    General: Skin is warm and dry.     Coloration: Skin is not pale.     Findings: No erythema or rash.  Neurological:     Mental Status: She is alert.     Cranial Nerves: No cranial nerve deficit.     Sensory: No sensory deficit.     Motor: Weakness present.     Gait: Gait abnormal.     Deep Tendon Reflexes: Reflexes normal.     Comments: Baseline 4/5 strength RUE and RLE Gait is labored with walker Shuffling gait No rhomberg Poor exercise tonerance   Psychiatric:        Mood and Affect: Mood normal.           Assessment & Plan:   Problem List Items Addressed This Visit      Other   Poor balance    Multifactorial balance loss with age S/p CVA with R sided weakness as well as mod to severe chronic back and knee pain req chronic narcotic medication  Enc pt to use walker at all times and keep cell phone on her or get a med alert button Ref to PT for eval/tx and gait training if appropriate      Relevant Orders   Ambulatory referral to Physical Therapy   Fall in home - Primary    Mis stepped with walker (stomping on bug)  Disc risks of  this- understands Disc cell phone or medic alert button Continue walker  Ref to PT for fall prev/balance/strength      Relevant Orders   Ambulatory referral to Physical Therapy

## 2018-08-13 NOTE — Assessment & Plan Note (Signed)
Multifactorial balance loss with age S/p CVA with R sided weakness as well as mod to severe chronic back and knee pain req chronic narcotic medication  Enc pt to use walker at all times and keep cell phone on her or get a med alert button Ref to PT for eval/tx and gait training if appropriate

## 2018-08-13 NOTE — Patient Instructions (Signed)
Let's refer you to physical therapy for balance problems/falls in the setting of chronic pain  Try to stay strong- any exercise you do sitting that you tolerate is good   The office will call you to schedule

## 2018-08-13 NOTE — Assessment & Plan Note (Signed)
Mis stepped with walker (stomping on bug)  Disc risks of this- understands Disc cell phone or medic alert button Continue walker  Ref to PT for fall prev/balance/strength

## 2018-08-14 DIAGNOSIS — H353132 Nonexudative age-related macular degeneration, bilateral, intermediate dry stage: Secondary | ICD-10-CM | POA: Diagnosis not present

## 2018-08-16 ENCOUNTER — Ambulatory Visit
Admission: RE | Admit: 2018-08-16 | Discharge: 2018-08-16 | Disposition: A | Discharge status: Home / Self Care | Payer: Medicare Other | Source: Ambulatory Visit | Attending: Family Medicine | Admitting: Family Medicine

## 2018-08-16 ENCOUNTER — Other Ambulatory Visit: Payer: Self-pay

## 2018-08-16 DIAGNOSIS — Z1231 Encounter for screening mammogram for malignant neoplasm of breast: Secondary | ICD-10-CM

## 2018-09-13 DIAGNOSIS — H353132 Nonexudative age-related macular degeneration, bilateral, intermediate dry stage: Secondary | ICD-10-CM | POA: Diagnosis not present

## 2018-09-25 ENCOUNTER — Other Ambulatory Visit: Payer: Self-pay

## 2018-09-25 DIAGNOSIS — R6889 Other general symptoms and signs: Secondary | ICD-10-CM | POA: Diagnosis not present

## 2018-09-25 DIAGNOSIS — Z20822 Contact with and (suspected) exposure to covid-19: Secondary | ICD-10-CM

## 2018-09-27 LAB — NOVEL CORONAVIRUS, NAA: SARS-CoV-2, NAA: NOT DETECTED

## 2018-10-04 DIAGNOSIS — H524 Presbyopia: Secondary | ICD-10-CM | POA: Diagnosis not present

## 2018-10-04 DIAGNOSIS — H04123 Dry eye syndrome of bilateral lacrimal glands: Secondary | ICD-10-CM | POA: Diagnosis not present

## 2018-10-04 DIAGNOSIS — H353132 Nonexudative age-related macular degeneration, bilateral, intermediate dry stage: Secondary | ICD-10-CM | POA: Diagnosis not present

## 2018-10-04 DIAGNOSIS — H43813 Vitreous degeneration, bilateral: Secondary | ICD-10-CM | POA: Diagnosis not present

## 2018-10-04 DIAGNOSIS — D4981 Neoplasm of unspecified behavior of retina and choroid: Secondary | ICD-10-CM | POA: Diagnosis not present

## 2018-10-13 DIAGNOSIS — H353132 Nonexudative age-related macular degeneration, bilateral, intermediate dry stage: Secondary | ICD-10-CM | POA: Diagnosis not present

## 2018-10-22 ENCOUNTER — Telehealth: Payer: Self-pay | Admitting: *Deleted

## 2018-10-22 NOTE — Telephone Encounter (Signed)
Salt water gargle and chloraseptic throat spray are good to try.  Will make further plan after our visit tomorrow

## 2018-10-22 NOTE — Telephone Encounter (Signed)
Patient called stating that she would like Dr. Glori Bickers to call her some medication in for her sore throat. Patient stated that she has had a sore throat for over a week. Patient stated that she started with congestion over two weeks ago and now it is a little productive/gray brown in color. Patient stated that she does not have a fever, no difficulty breathing or SOB. Patient scheduled for a virtual visit tomorrow at noon. Patient given ER precautions and she verbalized understanding. Patient stated that she had a covid test several weeks ago and it was negative.

## 2018-10-22 NOTE — Telephone Encounter (Signed)
Left message to call back  

## 2018-10-22 NOTE — Telephone Encounter (Signed)
Patient advised and verbalized understanding 

## 2018-10-23 ENCOUNTER — Ambulatory Visit (INDEPENDENT_AMBULATORY_CARE_PROVIDER_SITE_OTHER): Payer: Medicare Other | Admitting: Family Medicine

## 2018-10-23 ENCOUNTER — Encounter: Payer: Self-pay | Admitting: Family Medicine

## 2018-10-23 DIAGNOSIS — J0100 Acute maxillary sinusitis, unspecified: Secondary | ICD-10-CM | POA: Diagnosis not present

## 2018-10-23 DIAGNOSIS — J019 Acute sinusitis, unspecified: Secondary | ICD-10-CM | POA: Insufficient documentation

## 2018-10-23 MED ORDER — AMOXICILLIN-POT CLAVULANATE 875-125 MG PO TABS: 1.0000 | ORAL_TABLET | Freq: Two times a day (BID) | ORAL | 0 refills | Status: DC

## 2018-10-23 NOTE — Patient Instructions (Signed)
Drink lots of fluids Continue to isolate yourself until symptoms improve  mucinex is fine  Nasal saline may help Take augmentin for sinus infection as directed  Update if not starting to improve in a week or if worsening    If fever/worse cough/shortness of breath or loss of taste/smell please alert me

## 2018-10-23 NOTE — Progress Notes (Signed)
Virtual Visit via Video Note  I connected with [name] [name] on 10/23/18 at 12:00 PM EDT by a video enabled telemedicine application and verified that I am speaking with the correct person using two identifiers.  Location: Patient: home Provider: office    I discussed the limitations of evaluation and management by telemedicine and the availability of in person appointments. The patient expressed understanding and agreed to proceed.  History of Present Illness: Pt presents with uri symptoms  Started symptoms 4 weeks ago with ST Then hoarse voice   Cough-makes her more hoarse  A little productive of white phlegm (a little color prior) 02 level 96% (uses 02 at night)  Sob every so often-baseline Some chest tightness/? Wheeze   Headache -worse this am  Behind eyes  Not worse on one side   Nasal congestion - L side bloody  Yellow  No pain to press on her face  Headache worsens to bend forward   Ears feel ok -not full   Eyes are burning a bit   No loss of taste or smell   Appetite is ok  No heartburn-takes nexium   No sick exposures  Is exposed to some sawdust /construction   Had corona virus screen 9/8 negative (when throat started    She had her flu shot and shingrix shot   otc Sinus and headache pills  Has not needed hydrocodone  Drinks lots of fluids  Takes mucinex chronically bid   Patient Active Problem List   Diagnosis Date Noted  . Acute sinusitis 10/23/2018  . Poor balance 08/13/2018  . Fall in home 08/13/2018  . Memory loss 06/27/2018  . Physical deconditioning 03/19/2018  . Chronic diastolic heart failure ([place]) 02/09/2018  . Prediabetes 01/26/2017  . Hearing loss 01/19/2016  . Carpal tunnel syndrome 08/31/2015  . Encounter for Medicare annual wellness exam 06/04/2014  . Obesity 06/04/2014  . Estrogen deficiency 06/04/2014  . Colon cancer screening 06/04/2014  . Schatzki's ring 03/27/2014  . OA (osteoarthritis) of knee 08/16/2011  .  Hemorrhoids 08/10/2011  . Other dyspnea and respiratory abnormality 06/21/2010  . Urinary incontinence 05/27/2010  . OVERACTIVE BLADDER 03/12/2010  . Lumbar degenerative disc disease 04/13/2009  . GERD 10/15/2008  . CEREBROVASCULAR ACCIDENT, HX OF 10/15/2008  . ARTHRITIS, GENERALIZED 06/26/2008  . EXTERNAL HEMORRHOIDS 02/01/2008  . HIATAL HERNIA 02/01/2008  . DIVERTICULOSIS OF COLON 02/01/2008  . GASTRITIS, HX OF 02/01/2008  . DYSPNEA 09/19/2007  . Vitamin D deficiency 07/03/2007  . Osteoporosis of lumbar spine 06/13/2007  . Fatigue 06/13/2007  . HELICOBACTER PYLORI INFECTION 06/06/2006  . Hyperlipidemia 06/06/2006  . Generalized anxiety disorder 06/06/2006  . Adjustment disorder with mixed anxiety and depressed mood 06/06/2006  . MACULAR DEGENERATION 06/06/2006  . H/O: CVA (cerebrovascular accident) 06/06/2006  . OSTEOARTHRITIS 06/06/2006   Past Medical History:  Diagnosis Date  . Alternating constipation and diarrhea   . Anxiety state, unspecified   . Complication of anesthesia    slow to wake up with surgery several years ago   . Depressive disorder, not elsewhere classified   . Diaphragmatic hernia without mention of obstruction or gangrene   . Difficulty sleeping   . Diverticulosis of colon (without mention of hemorrhage)   . Esophageal reflux   . External hemorrhoids without mention of complication   . Fatigue   . Frequency of urination   . Helicobacter pylori (H. pylori)   . Hiatal hernia   . History of transfusion   . Lumbar spondylosis   .  Macular degeneration (senile) of retina, unspecified   . Osteoarthrosis, unspecified whether generalized or localized, unspecified site 08-11-11   hx. "rhematoid arthritis", osteoarthritis, DDD, bursitis(hip)  . Osteoporosis   . Palpitations   . PONV (postoperative nausea and vomiting)   . Schatzki's ring   . Shortness of breath 08-11-11   with exertion only at present  . Unspecified cerebral artery occlusion with cerebral  infarction 08-11-11   '97/ '06( TIA)-affected lt. side, slight weakness L side  . Unspecified vitamin D deficiency    Past Surgical History:  Procedure Laterality Date  . ABDOMINAL HYSTERECTOMY  08-11-11  . APPENDECTOMY    . BACK SURGERY  08-11-11   '11-hx. lumbar fusion with retained hardware  . CATARACT EXTRACTION  08-11-11   Bilateral  . ESOPHAGEAL DILATION    . KNEE SURGERY  08-11-11   rt. knee scope  . PATELLAR TENDON REPAIR Left 02/22/2014   Procedure: PATELLA TENDON REPAIR;  Surgeon: Gearlean Alf, MD;  Location: [facility];  Service: Orthopedics;  Laterality: Left;  . PATELLAR TENDON REPAIR Left 06/25/2014   Procedure: LEFT PATELLA TENDON REPAIR;  Surgeon: Gaynelle Arabian, MD;  Location: [facility];  Service: Orthopedics;  Laterality: Left;  . TOTAL KNEE ARTHROPLASTY  08/16/2011   Procedure: TOTAL KNEE ARTHROPLASTY;  Surgeon: Gearlean Alf, MD;  Location: [facility];  Service: Orthopedics;  Laterality: Right;  . TOTAL KNEE ARTHROPLASTY Left 01/27/2014   Procedure: LEFT TOTAL KNEE ARTHROPLASTY;  Surgeon: Gearlean Alf, MD;  Location: [facility];  Service: Orthopedics;  Laterality: Left;  . TUBAL LIGATION     Social History   Tobacco Use  . Smoking status: Never Smoker  . Smokeless tobacco: Never Used  Substance Use Topics  . Alcohol use: No    Alcohol/week: 0.0 standard drinks  . Drug use: No   Family History  Problem Relation Age of Onset  . Heart disease Father   . Coronary artery disease Brother   . Diabetes Brother   . Heart disease Brother   . Pancreatic cancer Daughter   . Diabetes Sister   . Colon cancer Neg Hx   . Colon polyps Neg Hx   . Esophageal cancer Neg Hx   . Gallbladder disease Neg Hx    Allergies  Allergen Reactions  . Alendronate Sodium     REACTION: JAW PAIN  . Alendronate Sodium Other (See Comments)    REACTION: JAW PAIN  . Celecoxib Other (See Comments)    REACTION: GI UPSET REACTION: GI UPSET  . Nexium [Esomeprazole Magnesium] Nausea And Vomiting    Pt  cannot take the generic Does fine with DAW Pt cannot take the generic Does fine with DAW  . Omeprazole Nausea Only  . Protonix [Pantoprazole Sodium] Nausea And Vomiting  . Rabeprazole Nausea And Vomiting  . Latex Itching and Rash   Current Outpatient Medications on File Prior to Visit  Medication Sig Dispense Refill  . acetaminophen (TYLENOL) 325 MG tablet Take 2 tablets (650 mg total) by mouth every 6 (six) hours as needed for mild pain (or Fever >/= 101). 40 tablet 0  . ALPRAZolam (XANAX) 0.5 MG tablet Take 1 tablet (0.5 mg total) by mouth at bedtime as needed. for sleep 90 tablet 0  . atorvastatin (LIPITOR) 10 MG tablet Take 1 tablet (10 mg total) by mouth daily. 90 tablet 3  . calcitonin, salmon, (MIACALCIN/FORTICAL) 200 UNIT/ACT nasal spray Place 1 spray into alternate nostrils daily. 3.7 mL 0  . calcium carbonate (TUMS -  DOSED IN MG ELEMENTAL CALCIUM) 500 MG chewable tablet Chew 1 tablet (200 mg of elemental calcium total) by mouth 4 (four) times daily as needed for indigestion or heartburn. 10 tablet 0  . clopidogrel (PLAVIX) 75 MG tablet TAKE 1 TABLET DAILY 90 tablet 1  . cyclobenzaprine (FLEXERIL) 10 MG tablet TAKE 1 TABLET(10 MG) BY MOUTH THREE TIMES DAILY AS NEEDED FOR MUSCLE SPASMS 30 tablet 1  . diclofenac sodium (VOLTAREN) 1 % GEL Apply 1 application topically 2 (two) times daily.    Marland Kitchen esomeprazole (NEXIUM) 40 MG capsule Take 1 capsule (40 mg total) by mouth 2 (two) times daily before a meal. 180 capsule 3  . fluticasone (FLONASE) 50 MCG/ACT nasal spray Place 1-2 sprays into both nostrils daily as needed for allergies or rhinitis.    Marland Kitchen HYDROcodone-acetaminophen (NORCO) 10-325 MG tablet Take 1 tablet by mouth every 6 (six) hours as needed.   0  . metoprolol succinate (TOPROL XL) 25 MG 24 hr tablet TAKE ONE-HALF (1/2) TABLET AT BEDTIME 45 tablet 1  . Multiple Vitamins-Minerals (PRESERVISION AREDS PO) Take 1 capsule by mouth 2 (two) times daily.    Vladimir Faster Glycol-Propyl Glycol  (SYSTANE OP) Apply 1-2 drops to eye 3 (three) times daily as needed (dry eyes.).     Marland Kitchen sertraline (ZOLOFT) 100 MG tablet Take 2 tablets (200 mg total) by mouth daily. (Patient taking differently: Take 100 mg by mouth 2 (two) times daily. ) 180 tablet 3  . Wheat Dextrin (BENEFIBER PO) Take by mouth daily.    . furosemide (LASIX) 20 MG tablet Take 1 tablet (20 mg total) by mouth daily. 90 tablet 3   No current facility-administered medications on file prior to visit.    Review of Systems  Constitutional: Positive for malaise/fatigue. Negative for chills and fever.  HENT: Positive for congestion, sinus pain and sore throat. Negative for ear discharge and ear pain.   Eyes: Negative for blurred vision, discharge and redness.  Respiratory: Positive for cough, sputum production and shortness of breath. Negative for stridor.        Baseline sob  occ chest/breathing feels tight-this is mild  Cardiovascular: Negative for chest pain and palpitations.  Gastrointestinal: Negative for abdominal pain, diarrhea, nausea and vomiting.  Musculoskeletal: Negative for myalgias.  Skin: Negative for itching and rash.  Neurological: Positive for headaches. Negative for dizziness.    Observations/Objective: Patient appears well, in no distress Weight is baseline  No facial swelling or asymmetry Mildly hoarse and congested sounding  Pt reports no pain upon palpation of sinuses/face No obvious tremor or mobility impairment Moving neck and UEs normally Able to hear the call well  No cough or shortness of breath during interview  Talkative and mentally sharp with no cognitive changes No skin changes on face or neck , no rash or pallor Affect is normal    Assessment and Plan: Problem List Items Addressed This Visit      Respiratory   Acute sinusitis    After 2-4 weeks of uri symptoms (covid test neg a mo ago)  Now worsening facial pain tx with augmentin bid for 7d Fluids/rest/nasal saline and  mucinex Update if not starting to improve in a week or if worsening   Rev s/s of covid to watch for  Can re screen if no improvement or worse Will continue to isolate       Relevant Medications   amoxicillin-clavulanate (AUGMENTIN) 875-125 MG tablet       Follow Up Instructions: Drink  lots of fluids Continue to isolate yourself until symptoms improve  mucinex is fine  Nasal saline may help Take augmentin for sinus infection as directed  Update if not starting to improve in a week or if worsening    If fever/worse cough/shortness of breath or loss of taste/smell please alert me    I discussed the assessment and treatment plan with the patient. The patient was provided an opportunity to ask questions and all were answered. The patient agreed with the plan and demonstrated an understanding of the instructions.   The patient was advised to call back or seek an in-person evaluation if the symptoms worsen or if the condition fails to improve as anticipated.     Loura Pardon, MD

## 2018-10-23 NOTE — Assessment & Plan Note (Signed)
After 2-4 weeks of uri symptoms (covid test neg a mo ago)  Now worsening facial pain tx with augmentin bid for 7d Fluids/rest/nasal saline and mucinex Update if not starting to improve in a week or if worsening   Rev s/s of covid to watch for  Can re screen if no improvement or worse Will continue to isolate

## 2018-11-02 ENCOUNTER — Other Ambulatory Visit: Payer: Self-pay

## 2018-11-02 ENCOUNTER — Ambulatory Visit (INDEPENDENT_AMBULATORY_CARE_PROVIDER_SITE_OTHER): Payer: Medicare Other | Admitting: Cardiovascular Disease

## 2018-11-02 ENCOUNTER — Encounter: Payer: Self-pay | Admitting: Cardiovascular Disease

## 2018-11-02 VITALS — BP 102/60 | HR 64 | Ht 64.0 in | Wt 195.2 lb

## 2018-11-02 DIAGNOSIS — I5032 Chronic diastolic (congestive) heart failure: Secondary | ICD-10-CM

## 2018-11-02 DIAGNOSIS — E782 Mixed hyperlipidemia: Secondary | ICD-10-CM

## 2018-11-02 NOTE — Patient Instructions (Signed)

## 2018-11-02 NOTE — Progress Notes (Signed)
Cardiology Office Note:    Date:  11/02/2018   ID:  [name] [name], DOB 08/05/1934, MRN [id number]  PCP:  Abner Greenspan, MD  Cardiologist:  Sherren Mocha, MD  Electrophysiologist:  None   Referring MD: Abner Greenspan, MD   Chief Complaint  Patient presents with  . Shortness of Breath    History of Present Illness:    [name] [name] is a 84 y.o. female with a hx of heart palpitations and TIA, presenting for follow-up evaluation.  Patient also noted to have chronic dyspnea and diastolic dysfunction.  When I saw her in January of this year she complained of progressive dyspnea.  She was evaluated with echo and nuclear stress testing studies.  There is no ischemia identified on her stress test.  An echocardiogram showed normal LV function and no valvular disease.  There was no intracardiac shunt by bubble study assessment.  Patient is here alone today.  She is in the process of moving in with her son.  He is building in addition on his home for her.  She reports no significant change in her cardiac symptoms.  She remains short of breath with activity, but no progression of symptoms.  She is more limited by problems related to arthritis.  She denies orthopnea, PND, leg swelling, or chest pain.  She takes Lasix 20 mg most days, but does not take it when she has to go out of the house.  Past Medical History:  Diagnosis Date  . Alternating constipation and diarrhea   . Anxiety state, unspecified   . Complication of anesthesia    slow to wake up with surgery several years ago   . Depressive disorder, not elsewhere classified   . Diaphragmatic hernia without mention of obstruction or gangrene   . Difficulty sleeping   . Diverticulosis of colon (without mention of hemorrhage)   . Esophageal reflux   . External hemorrhoids without mention of complication   . Fatigue   . Frequency of urination   . Helicobacter pylori (H. pylori)   . Hiatal hernia   . History of transfusion   .  Lumbar spondylosis   . Macular degeneration (senile) of retina, unspecified   . Osteoarthrosis, unspecified whether generalized or localized, unspecified site 08-11-11   hx. "rhematoid arthritis", osteoarthritis, DDD, bursitis(hip)  . Osteoporosis   . Palpitations   . PONV (postoperative nausea and vomiting)   . Schatzki's ring   . Shortness of breath 08-11-11   with exertion only at present  . Unspecified cerebral artery occlusion with cerebral infarction 08-11-11   '97/ '06( TIA)-affected lt. side, slight weakness L side  . Unspecified vitamin D deficiency     Past Surgical History:  Procedure Laterality Date  . ABDOMINAL HYSTERECTOMY  08-11-11  . APPENDECTOMY    . BACK SURGERY  08-11-11   '11-hx. lumbar fusion with retained hardware  . CATARACT EXTRACTION  08-11-11   Bilateral  . ESOPHAGEAL DILATION    . KNEE SURGERY  08-11-11   rt. knee scope  . PATELLAR TENDON REPAIR Left 02/22/2014   Procedure: PATELLA TENDON REPAIR;  Surgeon: Gearlean Alf, MD;  Location: [facility];  Service: Orthopedics;  Laterality: Left;  . PATELLAR TENDON REPAIR Left 06/25/2014   Procedure: LEFT PATELLA TENDON REPAIR;  Surgeon: Gaynelle Arabian, MD;  Location: [facility];  Service: Orthopedics;  Laterality: Left;  . TOTAL KNEE ARTHROPLASTY  08/16/2011   Procedure: TOTAL KNEE ARTHROPLASTY;  Surgeon: Gearlean Alf, MD;  Location:  [facility];  Service: Orthopedics;  Laterality: Right;  . TOTAL KNEE ARTHROPLASTY Left 01/27/2014   Procedure: LEFT TOTAL KNEE ARTHROPLASTY;  Surgeon: Gearlean Alf, MD;  Location: [facility];  Service: Orthopedics;  Laterality: Left;  . TUBAL LIGATION      Current Medications: Current Meds  Medication Sig  . acetaminophen (TYLENOL) 325 MG tablet Take 2 tablets (650 mg total) by mouth every 6 (six) hours as needed for mild pain (or Fever >/= 101).  Marland Kitchen ALPRAZolam (XANAX) 0.5 MG tablet Take 1 tablet (0.5 mg total) by mouth at bedtime as needed. for sleep  . atorvastatin (LIPITOR) 10 MG tablet Take 1  tablet (10 mg total) by mouth daily.  . calcium carbonate (TUMS - DOSED IN MG ELEMENTAL CALCIUM) 500 MG chewable tablet Chew 1 tablet (200 mg of elemental calcium total) by mouth 4 (four) times daily as needed for indigestion or heartburn.  . clopidogrel (PLAVIX) 75 MG tablet TAKE 1 TABLET DAILY  . diclofenac sodium (VOLTAREN) 1 % GEL Apply 1 application topically 2 (two) times daily.  Marland Kitchen esomeprazole (NEXIUM) 40 MG capsule Take 1 capsule (40 mg total) by mouth 2 (two) times daily before a meal.  . fluticasone (FLONASE) 50 MCG/ACT nasal spray Place 1-2 sprays into both nostrils daily as needed for allergies or rhinitis.  . furosemide (LASIX) 20 MG tablet Take 1 tablet (20 mg total) by mouth daily.  Marland Kitchen HYDROcodone-acetaminophen (NORCO) 10-325 MG tablet Take 1 tablet by mouth every 6 (six) hours as needed.   . metoprolol succinate (TOPROL XL) 25 MG 24 hr tablet TAKE ONE-HALF (1/2) TABLET AT BEDTIME  . Multiple Vitamins-Minerals (PRESERVISION AREDS PO) Take 1 capsule by mouth 2 (two) times daily.  Vladimir Faster Glycol-Propyl Glycol (SYSTANE OP) Apply 1-2 drops to eye 3 (three) times daily as needed (dry eyes.).   Marland Kitchen sertraline (ZOLOFT) 100 MG tablet Take 2 tablets (200 mg total) by mouth daily. (Patient taking differently: Take 100 mg by mouth 2 (two) times daily. )     Allergies:   Alendronate sodium, Alendronate sodium, Celecoxib, Nexium [esomeprazole magnesium], Omeprazole, Protonix [pantoprazole sodium], Rabeprazole, and Latex   Social History   Socioeconomic History  . Marital status: Legally Separated    Spouse name: Not on file  . Number of children: 3  . Years of education: Not on file  . Highest education level: Not on file  Occupational History  . Occupation: disabled    Employer: RETIRED  Social Needs  . Financial resource strain: Not on file  . Food insecurity    Worry: Not on file    Inability: Not on file  . Transportation needs    Medical: Not on file    Non-medical: Not on  file  Tobacco Use  . Smoking status: Never Smoker  . Smokeless tobacco: Never Used  Substance and Sexual Activity  . Alcohol use: No    Alcohol/week: 0.0 standard drinks  . Drug use: No  . Sexual activity: Never  Lifestyle  . Physical activity    Days per week: Not on file    Minutes per session: Not on file  . Stress: Not on file  Relationships  . Social Herbalist on phone: Not on file    Gets together: Not on file    Attends religious service: Not on file    Active member of club or organization: Not on file    Attends meetings of clubs or organizations: Not on file  Relationship status: Not on file  Other Topics Concern  . Not on file  Social History Narrative  . Not on file     Family History: The patient's family history includes Coronary artery disease in her brother; Diabetes in her brother and sister; Heart disease in her brother and father; Pancreatic cancer in her daughter. There is no history of Colon cancer, Colon polyps, Esophageal cancer, or Gallbladder disease.  ROS:   Please see the history of present illness.    All other systems reviewed and are negative.  EKGs/Labs/Other Studies Reviewed:    The following studies were reviewed today: Echo 02-23-2018: IMPRESSIONS    1. The left ventricle has normal systolic function of 123456. The cavity size was normal. There is mildly increased left ventricular wall thickness. Echo evidence of pseudonormalization in diastolic relaxation.  2. The right ventricle has normal systolic function. The cavity was normal . There is no increase in right ventricular wall thickness. Right ventricular systolic pressure is mildly elevated with an estimated pressure of 33.5 mmHg.  3. The tricuspid valve was normal in structure.  4. The aortic valve is tricuspid There is mild calcification of the aortic valve. Aortic valve regurgitation is trivial by color flow Doppler.  5. The pulmonic valve was normal in structure.   6. Normal LV systolic function; moderate diastolic dysfunction; mild LVH; mild TR; mild pulmonary hypertension.  Myoview Stress Test 02-23-2018: Study Highlights   Nuclear stress EF: 69%.  Normal perfusion  The study is normal.  This is a low risk study.    EKG:  EKG is not ordered today.   Recent Labs: 02/09/2018: ALT 15; BUN 22; Creatinine, Ser 0.92; Potassium 4.3; Sodium 140 06/27/2018: Hemoglobin 13.6; Platelets 272.0; TSH 2.40  Recent Lipid Panel    Component Value Date/Time   CHOL 187 02/09/2018 1348   TRIG 115.0 02/09/2018 1348   HDL 72.40 02/09/2018 1348   CHOLHDL 3 02/09/2018 1348   VLDL 23.0 02/09/2018 1348   LDLCALC 91 02/09/2018 1348    Physical Exam:    VS:  BP 102/60   Pulse 64   Ht 5\' 4"  (1.626 m)   Wt 195 lb 3.2 oz (88.5 kg)   SpO2 96%   BMI 33.51 kg/m     Wt Readings from Last 3 Encounters:  11/02/18 195 lb 3.2 oz (88.5 kg)  08/13/18 193 lb 1 oz (87.6 kg)  06/27/18 193 lb 6 oz (87.7 kg)     GEN:  Well nourished, well developed pleasant elderly woman in no acute distress HEENT: Normal NECK: No JVD; No carotid bruits LYMPHATICS: No lymphadenopathy CARDIAC: RRR, 2/6 early peaking systolic ejection murmur at the right upper sternal border RESPIRATORY:  Clear to auscultation without rales, wheezing or rhonchi  ABDOMEN: Soft, non-tender, non-distended MUSCULOSKELETAL:  No edema; No deformity  SKIN: Warm and dry NEUROLOGIC:  Alert and oriented x 3 PSYCHIATRIC:  Normal affect   ASSESSMENT:    1. Chronic diastolic heart failure ([place])   2. Mixed hyperlipidemia    PLAN:    In order of problems listed above:  1. The patient appears stable.  I reviewed her echocardiogram from January.  She avoids sodium.  Her blood pressure is in an ideal range.  She has no clinical exam evidence of volume excess.  I have suspected that her shortness of breath is multifactorial.  She will continue on her current medical program without change. 2. Treated with a  statin drug.  LDL cholesterol 91 mg/dL, HDL  72 mg/dL, triglycerides 115.  Medication Adjustments/Labs and Tests Ordered: Current medicines are reviewed at length with the patient today.  Concerns regarding medicines are outlined above.  No orders of the defined types were placed in this encounter.  No orders of the defined types were placed in this encounter.   Patient Instructions  Medication Instructions:  Your provider recommends that you continue on your current medications as directed. Please refer to the Current Medication list given to you today.   *If you need a refill on your cardiac medications before your next appointment, please call your pharmacy*   Follow-Up: At [facility], you and your health needs are our priority.  As part of our continuing mission to provide you with exceptional heart care, we have created designated Provider Care Teams.  These Care Teams include your primary Cardiologist (physician) and Advanced Practice Providers (APPs -  Physician Assistants and Nurse Practitioners) who all work together to provide you with the care you need, when you need it.  Your next appointment:   12 months  The format for your next appointment:   In Person  Provider:   You may see Sherren Mocha, MD or one of the following Advanced Practice Providers on your designated Care Team:    [name] Dopp, PA-C  Vin Faucett, PA-C  Daune Perch, [place]     Signed, Sherren Mocha, MD  11/02/2018 9:44 AM    [place]

## 2018-11-12 DIAGNOSIS — M5136 Other intervertebral disc degeneration, lumbar region: Secondary | ICD-10-CM | POA: Diagnosis not present

## 2018-11-12 DIAGNOSIS — H353132 Nonexudative age-related macular degeneration, bilateral, intermediate dry stage: Secondary | ICD-10-CM | POA: Diagnosis not present

## 2018-11-12 DIAGNOSIS — Z79891 Long term (current) use of opiate analgesic: Secondary | ICD-10-CM | POA: Diagnosis not present

## 2018-11-19 ENCOUNTER — Other Ambulatory Visit: Payer: Self-pay | Admitting: Family Medicine

## 2018-11-20 NOTE — Telephone Encounter (Signed)
Name of Medication: xanax Name of Pharmacy: [place] or Written Date and Quantity: 05/28/18 #90 tabs with 0 refill Last Office Visit and Type: acute doxy on 10/23/18 Next Office Visit and Type: CPE/AWV on 02/13/2019 Last Controlled Substance Agreement Date: 05/07/2013 Last UDS:05/07/2013

## 2018-11-29 ENCOUNTER — Other Ambulatory Visit: Payer: Self-pay | Admitting: Cardiovascular Disease

## 2018-11-29 MED ORDER — METOPROLOL SUCCINATE ER 25 MG PO TB24: ORAL_TABLET | ORAL | 3 refills | Status: DC

## 2018-12-10 DIAGNOSIS — L820 Inflamed seborrheic keratosis: Secondary | ICD-10-CM | POA: Diagnosis not present

## 2018-12-10 DIAGNOSIS — D485 Neoplasm of uncertain behavior of skin: Secondary | ICD-10-CM | POA: Diagnosis not present

## 2018-12-10 DIAGNOSIS — C44729 Squamous cell carcinoma of skin of left lower limb, including hip: Secondary | ICD-10-CM | POA: Diagnosis not present

## 2018-12-10 DIAGNOSIS — L821 Other seborrheic keratosis: Secondary | ICD-10-CM | POA: Diagnosis not present

## 2018-12-11 ENCOUNTER — Encounter: Payer: Self-pay | Admitting: Family Medicine

## 2018-12-11 ENCOUNTER — Other Ambulatory Visit: Payer: Self-pay

## 2018-12-11 ENCOUNTER — Ambulatory Visit (INDEPENDENT_AMBULATORY_CARE_PROVIDER_SITE_OTHER): Payer: Medicare Other | Admitting: Family Medicine

## 2018-12-11 VITALS — BP 124/68 | HR 75 | Temp 96.8°F | Ht 64.0 in | Wt 195.1 lb

## 2018-12-11 DIAGNOSIS — Z7409 Other reduced mobility: Secondary | ICD-10-CM | POA: Diagnosis not present

## 2018-12-11 DIAGNOSIS — M5136 Other intervertebral disc degeneration, lumbar region: Secondary | ICD-10-CM

## 2018-12-11 DIAGNOSIS — I5032 Chronic diastolic (congestive) heart failure: Secondary | ICD-10-CM | POA: Diagnosis not present

## 2018-12-11 DIAGNOSIS — Z8673 Personal history of transient ischemic attack (TIA), and cerebral infarction without residual deficits: Secondary | ICD-10-CM | POA: Diagnosis not present

## 2018-12-11 DIAGNOSIS — R2689 Other abnormalities of gait and mobility: Secondary | ICD-10-CM

## 2018-12-11 DIAGNOSIS — M51369 Other intervertebral disc degeneration, lumbar region without mention of lumbar back pain or lower extremity pain: Secondary | ICD-10-CM

## 2018-12-11 NOTE — Assessment & Plan Note (Signed)
With residual L sided weakness This worsens her mobility impairment

## 2018-12-11 NOTE — Assessment & Plan Note (Signed)
From CVA Fall risk and mobility impaired  Can use walker for short distances due to pain as well

## 2018-12-11 NOTE — Patient Instructions (Signed)
I will work on the order and note for your power mobility device   We will let you know if you need anything else

## 2018-12-11 NOTE — Progress Notes (Signed)
Subjective:    Patient ID: [name] [name], female    DOB: 05-31-1934, 84 y.o.   MRN: [id number]  This visit occurred during the SARS-CoV-2 public health emergency.  Safety protocols were in place, including screening questions prior to the visit, additional usage of staff PPE, and extensive cleaning of exam room while observing appropriate contact time as indicated for disinfecting solutions.    HPI Pt presents for a mobility exam to get a hoveround scooter   Wt Readings from Last 3 Encounters:  12/11/18 195 lb 1 oz (88.5 kg)  11/02/18 195 lb 3.2 oz (88.5 kg)  08/13/18 193 lb 1 oz (87.6 kg)  ht 5 ft 4 inches  33.48 kg/m   Had her house fit for handicapped accessability   Back pain is severe - sees Dr Nelva Bush  Non operable and cannot get shots  Has rods/screws -fusion   Major reason for this visit is mobility exam  Change in condition-cannot stand for long periods of time  Can stand for only 2 minutes or less with a walker   02 sat sat is 96 %  No pressure ulcers (has not had a bed sore  Uses night time 02 for diastolic dysfunction   2-3 L at night  No trouble with sensation   Medical conditions impacting mobility Back pain :   Sitting in a chair  Knee pain- 2 knee replacements with ligament damage to L knee and mal placement of patella  Some weakness on L side from stroke Also generalized poor balance from that stroke   Sitting ; pain is 8/10 [place] : pain is 9/10  Lying down her pain is 4/10     Needs help with : getting to bathroom to bathe, kitchen to prep meals and bedroom to sleep  Getting to bathroom /bathing- her doors were made bigger to accomodate a scooter Making meals - can reach sink/stove from the scooter  Getting to bedroom (she can transfer from scooter to the bed   Kasandra Knudsen is not helpful due to balance  Cannot use walker for more than 2 minutes due to severe back pain  No recent falls , one slip but did not fall  She is not strong enough to  operate a manual wheelchair - esp on the left   Is strong enough to sit up by herself and could use a scooter  She is able to operate a power mobility devise mentally and physically (safely) She is willing and motivated to use a power mobility device  A scooter will greatly enhance her quality of life and ability to be independent   Patient Active Problem List   Diagnosis Date Noted  . Mobility impaired 12/11/2018  . Poor balance 08/13/2018  . Fall in home 08/13/2018  . Memory loss 06/27/2018  . Physical deconditioning 03/19/2018  . Chronic diastolic heart failure ([place]) 02/09/2018  . Prediabetes 01/26/2017  . Hearing loss 01/19/2016  . Carpal tunnel syndrome 08/31/2015  . Encounter for Medicare annual wellness exam 06/04/2014  . Obesity 06/04/2014  . Estrogen deficiency 06/04/2014  . Colon cancer screening 06/04/2014  . Schatzki's ring 03/27/2014  . OA (osteoarthritis) of knee 08/16/2011  . Hemorrhoids 08/10/2011  . Other dyspnea and respiratory abnormality 06/21/2010  . Urinary incontinence 05/27/2010  . OVERACTIVE BLADDER 03/12/2010  . Lumbar degenerative disc disease 04/13/2009  . GERD 10/15/2008  . CEREBROVASCULAR ACCIDENT, HX OF 10/15/2008  . ARTHRITIS, GENERALIZED 06/26/2008  . EXTERNAL HEMORRHOIDS 02/01/2008  . HIATAL HERNIA 02/01/2008  .  DIVERTICULOSIS OF COLON 02/01/2008  . GASTRITIS, HX OF 02/01/2008  . DYSPNEA 09/19/2007  . Vitamin D deficiency 07/03/2007  . Osteoporosis of lumbar spine 06/13/2007  . Fatigue 06/13/2007  . HELICOBACTER PYLORI INFECTION 06/06/2006  . Hyperlipidemia 06/06/2006  . Generalized anxiety disorder 06/06/2006  . Adjustment disorder with mixed anxiety and depressed mood 06/06/2006  . MACULAR DEGENERATION 06/06/2006  . H/O: CVA (cerebrovascular accident) 06/06/2006  . OSTEOARTHRITIS 06/06/2006   Past Medical History:  Diagnosis Date  . Alternating constipation and diarrhea   . Anxiety state, unspecified   . Complication of  anesthesia    slow to wake up with surgery several years ago   . Depressive disorder, not elsewhere classified   . Diaphragmatic hernia without mention of obstruction or gangrene   . Difficulty sleeping   . Diverticulosis of colon (without mention of hemorrhage)   . Esophageal reflux   . External hemorrhoids without mention of complication   . Fatigue   . Frequency of urination   . Helicobacter pylori (H. pylori)   . Hiatal hernia   . History of transfusion   . Lumbar spondylosis   . Macular degeneration (senile) of retina, unspecified   . Osteoarthrosis, unspecified whether generalized or localized, unspecified site 08-11-11   hx. "rhematoid arthritis", osteoarthritis, DDD, bursitis(hip)  . Osteoporosis   . Palpitations   . PONV (postoperative nausea and vomiting)   . Schatzki's ring   . Shortness of breath 08-11-11   with exertion only at present  . Unspecified cerebral artery occlusion with cerebral infarction 08-11-11   '97/ '06( TIA)-affected lt. side, slight weakness L side  . Unspecified vitamin D deficiency    Past Surgical History:  Procedure Laterality Date  . ABDOMINAL HYSTERECTOMY  08-11-11  . APPENDECTOMY    . BACK SURGERY  08-11-11   '11-hx. lumbar fusion with retained hardware  . CATARACT EXTRACTION  08-11-11   Bilateral  . ESOPHAGEAL DILATION    . KNEE SURGERY  08-11-11   rt. knee scope  . PATELLAR TENDON REPAIR Left 02/22/2014   Procedure: PATELLA TENDON REPAIR;  Surgeon: Gearlean Alf, MD;  Location: [facility];  Service: Orthopedics;  Laterality: Left;  . PATELLAR TENDON REPAIR Left 06/25/2014   Procedure: LEFT PATELLA TENDON REPAIR;  Surgeon: Gaynelle Arabian, MD;  Location: [facility];  Service: Orthopedics;  Laterality: Left;  . TOTAL KNEE ARTHROPLASTY  08/16/2011   Procedure: TOTAL KNEE ARTHROPLASTY;  Surgeon: Gearlean Alf, MD;  Location: [facility];  Service: Orthopedics;  Laterality: Right;  . TOTAL KNEE ARTHROPLASTY Left 01/27/2014   Procedure: LEFT TOTAL KNEE  ARTHROPLASTY;  Surgeon: Gearlean Alf, MD;  Location: [facility];  Service: Orthopedics;  Laterality: Left;  . TUBAL LIGATION     Social History   Tobacco Use  . Smoking status: Never Smoker  . Smokeless tobacco: Never Used  Substance Use Topics  . Alcohol use: No    Alcohol/week: 0.0 standard drinks  . Drug use: No   Family History  Problem Relation Age of Onset  . Heart disease Father   . Coronary artery disease Brother   . Diabetes Brother   . Heart disease Brother   . Pancreatic cancer Daughter   . Diabetes Sister   . Colon cancer Neg Hx   . Colon polyps Neg Hx   . Esophageal cancer Neg Hx   . Gallbladder disease Neg Hx    Allergies  Allergen Reactions  . Alendronate Sodium     REACTION: JAW PAIN  .  Alendronate Sodium Other (See Comments)    REACTION: JAW PAIN  . Celecoxib Other (See Comments)    REACTION: GI UPSET REACTION: GI UPSET  . Nexium [Esomeprazole Magnesium] Nausea And Vomiting    Pt cannot take the generic Does fine with DAW Pt cannot take the generic Does fine with DAW  . Omeprazole Nausea Only  . Protonix [Pantoprazole Sodium] Nausea And Vomiting  . Rabeprazole Nausea And Vomiting  . Latex Itching and Rash   Current Outpatient Medications on File Prior to Visit  Medication Sig Dispense Refill  . acetaminophen (TYLENOL) 325 MG tablet Take 2 tablets (650 mg total) by mouth every 6 (six) hours as needed for mild pain (or Fever >/= 101). 40 tablet 0  . ALPRAZolam (XANAX) 0.5 MG tablet TAKE 1 TABLET AT BEDTIME AS NEEDED FOR SLEEP 90 tablet 0  . atorvastatin (LIPITOR) 10 MG tablet Take 1 tablet (10 mg total) by mouth daily. 90 tablet 3  . calcium carbonate (TUMS - DOSED IN MG ELEMENTAL CALCIUM) 500 MG chewable tablet Chew 1 tablet (200 mg of elemental calcium total) by mouth 4 (four) times daily as needed for indigestion or heartburn. 10 tablet 0  . clopidogrel (PLAVIX) 75 MG tablet TAKE 1 TABLET DAILY 90 tablet 1  . diclofenac sodium (VOLTAREN) 1 % GEL  Apply 1 application topically 2 (two) times daily.    Marland Kitchen esomeprazole (NEXIUM) 40 MG capsule Take 1 capsule (40 mg total) by mouth 2 (two) times daily before a meal. 180 capsule 3  . fluticasone (FLONASE) 50 MCG/ACT nasal spray Place 1-2 sprays into both nostrils daily as needed for allergies or rhinitis.    . furosemide (LASIX) 20 MG tablet Take 1 tablet (20 mg total) by mouth daily. 90 tablet 3  . HYDROcodone-acetaminophen (NORCO) 10-325 MG tablet Take 1 tablet by mouth every 6 (six) hours as needed.   0  . metoprolol succinate (TOPROL XL) 25 MG 24 hr tablet TAKE ONE-HALF (1/2) TABLET AT BEDTIME 45 tablet 3  . Multiple Vitamins-Minerals (PRESERVISION AREDS PO) Take 1 capsule by mouth 2 (two) times daily.    Vladimir Faster Glycol-Propyl Glycol (SYSTANE OP) Apply 1-2 drops to eye 3 (three) times daily as needed (dry eyes.).     Marland Kitchen sertraline (ZOLOFT) 100 MG tablet Take 2 tablets (200 mg total) by mouth daily. (Patient taking differently: Take 100 mg by mouth 2 (two) times daily. ) 180 tablet 3   No current facility-administered medications on file prior to visit.     Review of Systems  Constitutional: Positive for activity change and fatigue. Negative for appetite change, fever and unexpected weight change.  HENT: Negative for congestion, ear pain, rhinorrhea, sinus pressure and sore throat.   Eyes: Negative for pain, redness and visual disturbance.  Respiratory: Negative for cough, shortness of breath and wheezing.        Sob on exertion   Cardiovascular: Negative for chest pain and palpitations.       Occ pedal edema  Gastrointestinal: Negative for abdominal pain, blood in stool, constipation and diarrhea.  Endocrine: Negative for polydipsia and polyuria.  Genitourinary: Negative for dysuria, frequency and urgency.  Musculoskeletal: Positive for arthralgias, back pain, gait problem and myalgias.  Skin: Negative for pallor and rash.  Allergic/Immunologic: Negative for environmental allergies.   Neurological: Positive for headaches. Negative for dizziness and syncope.  Hematological: Negative for adenopathy. Does not bruise/bleed easily.  Psychiatric/Behavioral: Negative for decreased concentration and dysphoric mood. The patient is not nervous/anxious.  Objective:   Physical Exam Constitutional:      General: She is not in acute distress.    Appearance: Normal appearance. She is obese. She is not ill-appearing or diaphoretic.  HENT:     Head: Normocephalic and atraumatic.     Mouth/Throat:     Mouth: Mucous membranes are moist.  Eyes:     Extraocular Movements: Extraocular movements intact.     Conjunctiva/sclera: Conjunctivae normal.     Pupils: Pupils are equal, round, and reactive to light.  Neck:     Vascular: No carotid bruit.     Comments: Nl rom flex and extending neck  Pain with rotation to L limiting movement   Cardiovascular:     Rate and Rhythm: Normal rate and regular rhythm.     Pulses: Normal pulses.     Heart sounds: Normal heart sounds.  Pulmonary:     Effort: Pulmonary effort is normal. No respiratory distress.     Breath sounds: Normal breath sounds.     Comments: No crackles Musculoskeletal:        General: Tenderness and deformity present.     Right lower leg: No edema.     Left lower leg: No edema.     Comments: Limited rotation of neck due to pain  OA changes in hands  Nl rom of elbow/wrist/ankles  Limited rom of spine in any direction due to pain  L patella is displaced superiorly and she cannot flex   Can walk 3-4 steps with walker , shuffling gait and leaning forward due to pain   Lymphadenopathy:     Cervical: No cervical adenopathy.  Skin:    General: Skin is warm and dry.     Coloration: Skin is not pale.     Findings: No erythema or rash.  Neurological:     Mental Status: She is alert and oriented to person, place, and time.     Cranial Nerves: Cranial nerves are intact. No cranial nerve deficit.     Sensory: Sensation  is intact. No sensory deficit.     Motor: Weakness present. No tremor, abnormal muscle tone or pronator drift.     Coordination: Romberg sign positive. Coordination abnormal.     Gait: Gait abnormal.     Comments: 4/5 grip on L  RUE strength is 4/5 RLE strength is 4/5  5/5 strength in L hand , UE and LE   Walks with shuffling gait with walker 3 steps max due to pain  Unable to perform tandem walk   Pt cannot raise from seated position w/o help    Psychiatric:        Mood and Affect: Mood normal.           Assessment & Plan:   Problem List Items Addressed This Visit      Cardiovascular and Mediastinum   Chronic diastolic heart failure (HCC)    Diastolic dysfunction- swelling is not bad  Wears 02 at night  Nl pulse ox today         Musculoskeletal and Integument   Lumbar degenerative disc disease    Moderate to severe chronic pain  Sees Dr Nelva Bush for pain medication  No surgical options  Pain limits standing and walking severely  This is the biggest cause of her mobility impairment          Other   H/O: CVA (cerebrovascular accident)    With residual L sided weakness This worsens her mobility impairment  Poor balance    From CVA Fall risk and mobility impaired  Can use walker for short distances due to pain as well       Mobility impaired - Primary    Several causes  Severe degenerative disc dz with back pain  Weakness on L since CVA Poor balance since CVA L knee pain since surgery   She cannot use a cane  She can walk only very short distances with a walker  Not strong enough to propel a manual wheelchair   As power mobility device (scooter/POV) would greatly enhance quality of life and independence, enabling her to take care of her ADLs

## 2018-12-11 NOTE — Assessment & Plan Note (Signed)
Several causes  Severe degenerative disc dz with back pain  Weakness on L since CVA Poor balance since CVA L knee pain since surgery   She cannot use a cane  She can walk only very short distances with a walker  Not strong enough to propel a manual wheelchair   As power mobility device (scooter/POV) would greatly enhance quality of life and independence, enabling her to take care of her ADLs

## 2018-12-11 NOTE — Assessment & Plan Note (Signed)
Diastolic dysfunction- swelling is not bad  Wears 02 at night  Nl pulse ox today

## 2018-12-11 NOTE — Assessment & Plan Note (Signed)
Moderate to severe chronic pain  Sees Dr Nelva Bush for pain medication  No surgical options  Pain limits standing and walking severely  This is the biggest cause of her mobility impairment

## 2018-12-12 DIAGNOSIS — H353132 Nonexudative age-related macular degeneration, bilateral, intermediate dry stage: Secondary | ICD-10-CM | POA: Diagnosis not present

## 2018-12-18 DIAGNOSIS — U071 COVID-19: Secondary | ICD-10-CM

## 2018-12-18 HISTORY — DX: COVID-19: U07.1

## 2018-12-24 ENCOUNTER — Telehealth: Payer: Self-pay | Admitting: Internal Medicine

## 2018-12-24 NOTE — Telephone Encounter (Signed)
Called [employer] gave them new fax number for CMN for patient. Called and spoke with patient regarding their request, let patient know I reached out to [facility] and they will fax over what is needed. Patient voiced understanding  Nothing further needed at this time

## 2018-12-27 ENCOUNTER — Encounter: Payer: Self-pay | Admitting: *Deleted

## 2018-12-27 DIAGNOSIS — J9611 Chronic respiratory failure with hypoxia: Secondary | ICD-10-CM | POA: Insufficient documentation

## 2019-01-07 ENCOUNTER — Telehealth: Payer: Self-pay | Admitting: Internal Medicine

## 2019-01-07 NOTE — Telephone Encounter (Signed)
The form was faxed for [name] [name] to sign but she is no longer here. Dr. Chase Caller signed the form on 12/26/2018 and I faxed the form to [place] on 12/31/2018. I did call [employer] and spoke with [name] [name] and she stated that they did get the signed form and nothing else is needed. I called [name] and told her that [name] has the signed CMN for her 69

## 2019-01-08 ENCOUNTER — Telehealth: Payer: Self-pay

## 2019-01-08 NOTE — Telephone Encounter (Signed)
Thanks for doing that 

## 2019-01-08 NOTE — Telephone Encounter (Signed)
Pt received automated message that she has a [employer]. Pt is in bed now and does not have anything to write with and pt will cb for my chart assist #. Pt also wants to know if Dr Glori Bickers would recommend the covid vaccine for the pt. Pt request cb.

## 2019-01-08 NOTE — Telephone Encounter (Signed)
Pt notified of Dr. Marliss Coots comments and she verbalized understanding. I did give her mychart's phone # to get reset on her mychart.   While on the phone pt kept coughing multiple times and told me she has had cough, congestion and a ST for about a week, but she thinks it's just a "cold". I advise her with Covid there is no way to tell if she has a "cold" versus covid and offered her a virtual visit to discuss sxs with PCP. Pt declined to schedule an appt she said she is moving. She is moving in with her son and she isn't sure which house she will be at, at any point in the day so she didn't want to schedule an appt and be in the middle of moving. I advise pt that if she does have covid and she is moving and around movers and her son and other family that she could be exposing them to it also. I gave pt the testing info and asked that she at least get tested for Covid and not go anywhere until the results are back. Pt verbalized understanding and said she would call and make an appt  FYI to PCP

## 2019-01-08 NOTE — Telephone Encounter (Signed)
Most likely yes-she is higher risk due to age and medical problems.  First the hospital staff/ medical personnel and nursing facilities will get them though.  We will know more after that

## 2019-01-09 ENCOUNTER — Ambulatory Visit: Payer: Medicare Other | Attending: Internal Medicine

## 2019-01-09 ENCOUNTER — Encounter: Payer: Self-pay | Admitting: Family Medicine

## 2019-01-09 DIAGNOSIS — Z20822 Contact with and (suspected) exposure to covid-19: Secondary | ICD-10-CM

## 2019-01-10 ENCOUNTER — Telehealth: Payer: Self-pay | Admitting: Physician Assistant

## 2019-01-10 LAB — NOVEL CORONAVIRUS, NAA: SARS-CoV-2, NAA: NOT DETECTED

## 2019-01-10 NOTE — Telephone Encounter (Signed)
Patient contacted after hour answering service inquiring about metoprolol.  Apparently she lost the bottle for about 1.5 week and was able to find it today.  She is having a headache today and wonders if she can restart the metoprolol right away.  I encouraged her to restart the previous 12.5 mg daily of Toprol-XL as beta-blocker can help prevent future headache.  Otherwise I also encouraged her to take Tylenol on the side to help control the current headache.  She was appreciative of the call.

## 2019-01-17 ENCOUNTER — Ambulatory Visit: Payer: Medicare Other | Attending: Internal Medicine

## 2019-01-17 DIAGNOSIS — Z20822 Contact with and (suspected) exposure to covid-19: Secondary | ICD-10-CM

## 2019-01-18 LAB — NOVEL CORONAVIRUS, NAA: SARS-CoV-2, NAA: DETECTED — AB

## 2019-01-19 ENCOUNTER — Ambulatory Visit (HOSPITAL_COMMUNITY)
Admission: EM | Admit: 2019-01-19 | Discharge: 2019-01-19 | Disposition: A | Discharge status: Home / Self Care | Payer: Medicare Other | Attending: Family Medicine | Admitting: Family Medicine

## 2019-01-19 ENCOUNTER — Ambulatory Visit (INDEPENDENT_AMBULATORY_CARE_PROVIDER_SITE_OTHER): Payer: Medicare Other

## 2019-01-19 ENCOUNTER — Other Ambulatory Visit: Payer: Self-pay

## 2019-01-19 ENCOUNTER — Encounter: Payer: Self-pay | Admitting: Family Medicine

## 2019-01-19 ENCOUNTER — Ambulatory Visit (HOSPITAL_COMMUNITY): Payer: Medicare Other

## 2019-01-19 DIAGNOSIS — U071 COVID-19: Secondary | ICD-10-CM

## 2019-01-19 DIAGNOSIS — R059 Cough, unspecified: Secondary | ICD-10-CM

## 2019-01-19 DIAGNOSIS — R05 Cough: Secondary | ICD-10-CM

## 2019-01-19 DIAGNOSIS — G4734 Idiopathic sleep related nonobstructive alveolar hypoventilation: Secondary | ICD-10-CM

## 2019-01-19 DIAGNOSIS — R0981 Nasal congestion: Secondary | ICD-10-CM

## 2019-01-19 DIAGNOSIS — R0602 Shortness of breath: Secondary | ICD-10-CM

## 2019-01-19 MED ORDER — AEROCHAMBER PLUS FLO-VU LARGE MISC
1.0000 | Freq: Once | Status: AC
  Administered 2019-01-19: 15:00:00 1

## 2019-01-19 MED ORDER — AEROCHAMBER PLUS FLO-VU LARGE MISC
Status: AC
  Filled 2019-01-19: qty 1

## 2019-01-19 MED ORDER — ALBUTEROL SULFATE HFA 108 (90 BASE) MCG/ACT IN AERS
2.0000 | INHALATION_SPRAY | Freq: Once | RESPIRATORY_TRACT | Status: AC
  Administered 2019-01-19: 15:00:00 2 via RESPIRATORY_TRACT

## 2019-01-19 MED ORDER — ALBUTEROL SULFATE HFA 108 (90 BASE) MCG/ACT IN AERS
INHALATION_SPRAY | RESPIRATORY_TRACT | Status: AC
  Filled 2019-01-19: qty 6.7

## 2019-01-19 MED ORDER — PROMETHAZINE HCL 6.25 MG/5ML PO SYRP: 6.2500 mg | ORAL_SOLUTION | Freq: Four times a day (QID) | ORAL | 0 refills | Status: DC | PRN

## 2019-01-19 NOTE — ED Provider Notes (Signed)
[place]    CSN: [id number] Arrival date & time: 01/19/19  1141      History   Chief Complaint Chief Complaint  Patient presents with  . Cough    COVID +    HPI [name] [name] is a 85 y.o. female.   HPI  Patient with medical history significant for morbid obesity, CHF, and exertional dyspnea presents today concern for worsening of COVID-19 related symptoms. Patient reports being informed today that she tested positive for COVID-19. She endorses right sided chest tightness with worsened with deep breathing, persistent productive cough, and feeling fatigued. Patient is prescribed 2 liters oxygen at bedtime as needed. Patient has not lost sense of taste or smell. Continues to eat and drink at baseline.   Past Medical History:  Diagnosis Date  . Alternating constipation and diarrhea   . Anxiety state, unspecified   . Complication of anesthesia    slow to wake up with surgery several years ago   . Depressive disorder, not elsewhere classified   . Diaphragmatic hernia without mention of obstruction or gangrene   . Difficulty sleeping   . Diverticulosis of colon (without mention of hemorrhage)   . Esophageal reflux   . External hemorrhoids without mention of complication   . Fatigue   . Frequency of urination   . Helicobacter pylori (H. pylori)   . Hiatal hernia   . History of transfusion   . Lumbar spondylosis   . Macular degeneration (senile) of retina, unspecified   . Osteoarthrosis, unspecified whether generalized or localized, unspecified site 08-11-11   hx. "rhematoid arthritis", osteoarthritis, DDD, bursitis(hip)  . Osteoporosis   . Palpitations   . PONV (postoperative nausea and vomiting)   . Schatzki's ring   . Shortness of breath 08-11-11   with exertion only at present  . Unspecified cerebral artery occlusion with cerebral infarction 08-11-11   '97/ '06( TIA)-affected lt. side, slight weakness L side  . Unspecified vitamin D deficiency      Patient Active Problem List   Diagnosis Date Noted  . Mobility impaired 12/11/2018  . Poor balance 08/13/2018  . Fall in home 08/13/2018  . Memory loss 06/27/2018  . Physical deconditioning 03/19/2018  . Chronic diastolic heart failure ([place]) 02/09/2018  . Prediabetes 01/26/2017  . Hearing loss 01/19/2016  . Carpal tunnel syndrome 08/31/2015  . Encounter for Medicare annual wellness exam 06/04/2014  . Obesity 06/04/2014  . Estrogen deficiency 06/04/2014  . Colon cancer screening 06/04/2014  . Schatzki's ring 03/27/2014  . OA (osteoarthritis) of knee 08/16/2011  . Hemorrhoids 08/10/2011  . Other dyspnea and respiratory abnormality 06/21/2010  . Urinary incontinence 05/27/2010  . OVERACTIVE BLADDER 03/12/2010  . Lumbar degenerative disc disease 04/13/2009  . GERD 10/15/2008  . CEREBROVASCULAR ACCIDENT, HX OF 10/15/2008  . ARTHRITIS, GENERALIZED 06/26/2008  . EXTERNAL HEMORRHOIDS 02/01/2008  . HIATAL HERNIA 02/01/2008  . DIVERTICULOSIS OF COLON 02/01/2008  . GASTRITIS, HX OF 02/01/2008  . DYSPNEA 09/19/2007  . Vitamin D deficiency 07/03/2007  . Osteoporosis of lumbar spine 06/13/2007  . Fatigue 06/13/2007  . HELICOBACTER PYLORI INFECTION 06/06/2006  . Hyperlipidemia 06/06/2006  . Generalized anxiety disorder 06/06/2006  . Adjustment disorder with mixed anxiety and depressed mood 06/06/2006  . MACULAR DEGENERATION 06/06/2006  . H/O: CVA (cerebrovascular accident) 06/06/2006  . OSTEOARTHRITIS 06/06/2006    Past Surgical History:  Procedure Laterality Date  . ABDOMINAL HYSTERECTOMY  08-11-11  . APPENDECTOMY    . BACK SURGERY  08-11-11   '11-hx. lumbar  fusion with retained hardware  . CATARACT EXTRACTION  08-11-11   Bilateral  . ESOPHAGEAL DILATION    . KNEE SURGERY  08-11-11   rt. knee scope  . PATELLAR TENDON REPAIR Left 02/22/2014   Procedure: PATELLA TENDON REPAIR;  Surgeon: Gearlean Alf, MD;  Location: [facility];  Service: Orthopedics;  Laterality: Left;  .  PATELLAR TENDON REPAIR Left 06/25/2014   Procedure: LEFT PATELLA TENDON REPAIR;  Surgeon: Gaynelle Arabian, MD;  Location: [facility];  Service: Orthopedics;  Laterality: Left;  . TOTAL KNEE ARTHROPLASTY  08/16/2011   Procedure: TOTAL KNEE ARTHROPLASTY;  Surgeon: Gearlean Alf, MD;  Location: [facility];  Service: Orthopedics;  Laterality: Right;  . TOTAL KNEE ARTHROPLASTY Left 01/27/2014   Procedure: LEFT TOTAL KNEE ARTHROPLASTY;  Surgeon: Gearlean Alf, MD;  Location: [facility];  Service: Orthopedics;  Laterality: Left;  . TUBAL LIGATION      OB History   No obstetric history on file.      Home Medications    Prior to Admission medications   Medication Sig Start Date End Date Taking? Authorizing Provider  acetaminophen (TYLENOL) 325 MG tablet Take 2 tablets (650 mg total) by mouth every 6 (six) hours as needed for mild pain (or Fever >/= 101). 01/29/14   Perkins, Alexzandrew L, PA-C  ALPRAZolam (XANAX) 0.5 MG tablet TAKE 1 TABLET AT BEDTIME AS NEEDED FOR SLEEP 11/20/18   Tower, Wynelle Fanny, MD  atorvastatin (LIPITOR) 10 MG tablet Take 1 tablet (10 mg total) by mouth daily. 02/09/18   Tower, Wynelle Fanny, MD  calcium carbonate (TUMS - DOSED IN MG ELEMENTAL CALCIUM) 500 MG chewable tablet Chew 1 tablet (200 mg of elemental calcium total) by mouth 4 (four) times daily as needed for indigestion or heartburn. 02/25/14   Perkins, Alexzandrew L, PA-C  clopidogrel (PLAVIX) 75 MG tablet TAKE 1 TABLET DAILY 07/23/18   Tower, Medora A, MD  diclofenac sodium (VOLTAREN) 1 % GEL Apply 1 application topically 2 (two) times daily. 12/22/15   [provider]  esomeprazole (NEXIUM) 40 MG capsule Take 1 capsule (40 mg total) by mouth 2 (two) times daily before a meal. 02/09/18   Tower, Wynelle Fanny, MD  fluticasone (FLONASE) 50 MCG/ACT nasal spray Place 1-2 sprays into both nostrils daily as needed for allergies or rhinitis.    [provider]  furosemide (LASIX) 20 MG tablet Take 1 tablet (20 mg total) by mouth daily.  11/02/18 10/28/19  Sherren Mocha, MD  HYDROcodone-acetaminophen (NORCO) 10-325 MG tablet Take 1 tablet by mouth every 6 (six) hours as needed.  05/07/15   [provider]  metoprolol succinate (TOPROL XL) 25 MG 24 hr tablet TAKE ONE-HALF (1/2) TABLET AT BEDTIME 11/29/18   Sherren Mocha, MD  Multiple Vitamins-Minerals (PRESERVISION AREDS PO) Take 1 capsule by mouth 2 (two) times daily.    [provider]  Polyethyl Glycol-Propyl Glycol (SYSTANE OP) Apply 1-2 drops to eye 3 (three) times daily as needed (dry eyes.).     [provider]  sertraline (ZOLOFT) 100 MG tablet Take 2 tablets (200 mg total) by mouth daily. Patient taking differently: Take 100 mg by mouth 2 (two) times daily.  02/09/18   Tower, Wynelle Fanny, MD    Family History Family History  Problem Relation Age of Onset  . Heart disease Father   . Coronary artery disease Brother   . Diabetes Brother   . Heart disease Brother   . Pancreatic cancer Daughter   . Diabetes Sister   .  Colon cancer Neg Hx   . Colon polyps Neg Hx   . Esophageal cancer Neg Hx   . Gallbladder disease Neg Hx     Social History Social History   Tobacco Use  . Smoking status: Never Smoker  . Smokeless tobacco: Never Used  Substance Use Topics  . Alcohol use: No    Alcohol/week: 0.0 standard drinks  . Drug use: No     Allergies   Alendronate sodium, Alendronate sodium, Celecoxib, Nexium [esomeprazole magnesium], Omeprazole, Protonix [pantoprazole sodium], Rabeprazole, and Latex   Review of Systems Review of Systems Pertinent negatives listed in HPI  Physical Exam Triage Vital Signs ED Triage Vitals  Enc Vitals Group     BP 01/19/19 1244 135/61     Pulse Rate 01/19/19 1244 68     Resp 01/19/19 1244 18     Temp 01/19/19 1244 98.7 F (37.1 C)     Temp Source 01/19/19 1244 Oral     SpO2 01/19/19 1244 96 %     Weight --      Height --      Head Circumference --      Peak Flow --      Pain Score 01/19/19 1242  3     Pain Loc --      Pain Edu? --      Excl. in [place]? --    No data found.  Updated Vital Signs BP 135/61 (BP Location: Left Arm)   Pulse 68   Temp 98.7 F (37.1 C) (Oral)   Resp 18   SpO2 96%   Visual Acuity Right Eye Distance:   Left Eye Distance:   Bilateral Distance:    Right Eye Near:   Left Eye Near:    Bilateral Near:     Physical Exam Constitutional: Patient appears Chronically Ill- Appearing  and well-nourished. No distress. HENT: Normocephalic, atraumatic, External right and left ear normal. Oropharynx is clear and moist.  Eyes: Conjunctivae and EOM are normal. PERRLA, no scleral icterus. Neck: Normal ROM. Neck supple. No JVD. No tracheal deviation. No thyromegaly. No adenopathy.  CVS: RRR, S1/S2 +, no murmurs, no gallops, no carotid bruit.  Pulmonary: Effort normal. Decreased breath sounds/ Rhonchi L/R upper lobes. Persistent cough during exam.  Abdominal: Soft. BS +, no distension, tenderness, rebound or guarding.  Neuro: Alert. Normal reflexes, muscle tone coordination. No cranial nerve deficit. Skin: Skin is warm and dry. No rash noted. Not diaphoretic. No erythema. No pallor. Psychiatric: Normal mood and affect. Behavior, judgment, thought content normal. UC Treatments / Results  Labs (all labs ordered are listed, but only abnormal results are displayed) Labs Reviewed - No data to display  EKG   Radiology No results found.  Procedures Procedures (including critical care time)  Medications Ordered in UC Medications - No data to display  Initial Impression / Assessment and Plan / UC Course  I have reviewed the triage vital signs and the nursing notes.  Pertinent labs & imaging results that were available during my care of the patient were reviewed by me and considered in my medical decision making (see chart for details).   [place] status post diagnosis of COVID-19 on 01/15/2019.  Patient is high risk for complications associated with  COVID-19.  She presents today with worsening symptoms of shortness of breath.  Patient is on 2 L of as needed nighttime oxygen which she has been on continuously since the diagnosis of COVID-19.  Chest x-ray is reassuring and does not  indicate the presence of any opacities, bronchial changes or infiltrates which is reassuring the patient has not at this point developed pneumonia.  Encouraged to complete current therapy which is prescribed today. Start albuterol inhaler which patient was given and demonstrated how to use here during her visit along with the chamber.  I have also prescribed promethazine for cough.  Patient advised if symptoms worsen or do not improve she may warrant additional treatment with antibiotic therapy. My exam findings today are reassuring that symptomatic treatment would be beneficial in managing patient.  Oxygen saturation was also reassuring and patient is currently only undergoing an entire visit today.  Advised patient she can utilize oxygen during the day if needed for hypoxia.  Encourage patient to monitor her pulse ox which she has a pulse ox similar at home throughout the day when she experiences worsening symptoms of dyspnea.  Patient has been referred to the infusion center for outpatient management of COVID-19 as she meets the criteria for referral.  Patient advised that someone from the infusion center will contact her for further evaluation and see if she is appropriate for infusion therapy.  Patient verbalized understanding and agreement with plan.   Final Clinical Impressions(s) / UC Diagnoses   Final diagnoses:  DYSPNEA  Nocturnal hypoxia  COVID-19 virus infection  Cough     Discharge Instructions     Please follow-up if symptoms worsen or do not improve. I have prescribed Promethazine for cough, take as directed. For shortness of breath I have prescribed, albuterol inhaler, use 2 puffs every 4-6 hours as needed. I am referring you to the outpatient COVID19  Repiratory infusion clinic. Someone from there is office will contact you to evaluate your symptoms and determine if you would benefit from further outpatient management of COVID symptoms. Continue all other medications and home oxygen. Monitor oxygen saturation and temperature when daily or more frequently as needed.     ED Prescriptions    Medication Sig Dispense Auth. Provider   promethazine (PHENERGAN) 6.25 MG/5ML syrup Take 5 mLs (6.25 mg total) by mouth every 6 (six) hours as needed (cough). 120 mL Scot Jun, FNP     PDMP not reviewed this encounter.   Scot Jun, Agency 01/22/19 [phone]

## 2019-01-19 NOTE — ED Triage Notes (Signed)
Patient presents to Urgent Care with complaints of productive cough and concern for SOB. Patient reports she was called this morning and told she was covid positive and to come be assessed since she has a cough and is on O2 PRN at home for SOB.

## 2019-01-19 NOTE — Discharge Instructions (Signed)
Please follow-up if symptoms worsen or do not improve. I have prescribed Promethazine for cough, take as directed. For shortness of breath I have prescribed, albuterol inhaler, use 2 puffs every 4-6 hours as needed. I am referring you to the outpatient COVID19 Repiratory infusion clinic. Someone from there is office will contact you to evaluate your symptoms and determine if you would benefit from further outpatient management of COVID symptoms. Continue all other medications and home oxygen. Monitor oxygen saturation and temperature when daily or more frequently as needed.

## 2019-01-20 ENCOUNTER — Telehealth: Payer: Self-pay | Admitting: Unknown Physician Specialty

## 2019-01-20 ENCOUNTER — Telehealth (HOSPITAL_COMMUNITY): Payer: Self-pay | Admitting: Family Medicine

## 2019-01-20 ENCOUNTER — Other Ambulatory Visit: Payer: Self-pay | Admitting: Unknown Physician Specialty

## 2019-01-20 NOTE — Telephone Encounter (Signed)
Discussed with patient about Covid symptoms and the use of bamlanivimab, a monoclonal antibody infusion for those with mild to moderate Covid symptoms and at a high risk of hospitalization.  Pt is not qualified for this infusion at the [facility] infusion center due to symptoms greater than 2 weeks

## 2019-01-20 NOTE — Telephone Encounter (Signed)
Patent referred to COVID infusion center per guidelines of the [place] infusion center. Patient aware of referral.

## 2019-01-21 ENCOUNTER — Encounter: Payer: Self-pay | Admitting: Family Medicine

## 2019-01-21 ENCOUNTER — Ambulatory Visit (INDEPENDENT_AMBULATORY_CARE_PROVIDER_SITE_OTHER): Payer: Medicare Other | Admitting: Family Medicine

## 2019-01-21 DIAGNOSIS — I5032 Chronic diastolic (congestive) heart failure: Secondary | ICD-10-CM

## 2019-01-21 DIAGNOSIS — U071 COVID-19: Secondary | ICD-10-CM

## 2019-01-21 NOTE — Progress Notes (Signed)
Virtual Visit via Video Note  I connected with [name] on 01/21/19 at  4:15 PM EST by a video enabled telemedicine application and verified that I am speaking with the correct person using two identifiers.  Location: Patient: home Provider: office    I discussed the limitations of evaluation and management by telemedicine and the availability of in person appointments. The patient expressed understanding and agreed to proceed.  Parties involved in encounter  Patient: [name] [name]   Provider:  Loura Pardon MD    History of Present Illness: Pt presents with diagnosis of covid 19 - symptomatic  Positive test on 12/31   She started symptoms that day  [facility]   Light headed- makes her disoriented  Headache behind eyes  Achy - especially in her low back  Has had chills on and off  Tired /drowsy  Has not had a fever-she has been taking her temperature regularly  Has a chronic ST-no change  No nasal symptoms   Cough - while foamy discharge (deep cough)  No green mucous  Breathing is ok  She uses 02 at night baseline  Pulse ox readings are all in the normal range  No wheezing   Bad diarrhea yesterday and this am  Watery stool  She bought immodium and pepto- started this am and no bm this afternoon  Nausea but no vomiting   Sense of taste is affected-it is off    Had a CXR  DG Chest 2 View  Result Date: 01/19/2019 CLINICAL DATA:  Per pt: got results from covid this morning, positive. Been sick for over a week, cough, no fever, chest congestion, bad headache. EXAM: CHEST - 2 VIEW COMPARISON:  Chest radiograph 02/14/2018 FINDINGS: Stable cardiomediastinal contours with normal heart size. The lungs are clear. No pneumothorax or pleural effusion. Chronic wedge compression deformity in the lower thoracic spine. IMPRESSION: No evidence of active disease in the chest. Electronically Signed   By: Audie Pinto M.D.   On: 01/19/2019 14:18     She  was referred for antibody infusion but ended up being outside the time window for that   Sister and BIL dropped in on 12/25 - then got sick shortly after that  They are both sick   Patient Active Problem List   Diagnosis Date Noted  . COVID-19 01/21/2019  . Mobility impaired 12/11/2018  . Poor balance 08/13/2018  . Fall in home 08/13/2018  . Memory loss 06/27/2018  . Physical deconditioning 03/19/2018  . Chronic diastolic heart failure ([place]) 02/09/2018  . Prediabetes 01/26/2017  . Hearing loss 01/19/2016  . Carpal tunnel syndrome 08/31/2015  . Encounter for Medicare annual wellness exam 06/04/2014  . Obesity 06/04/2014  . Estrogen deficiency 06/04/2014  . Colon cancer screening 06/04/2014  . Schatzki's ring 03/27/2014  . OA (osteoarthritis) of knee 08/16/2011  . Hemorrhoids 08/10/2011  . Other dyspnea and respiratory abnormality 06/21/2010  . Urinary incontinence 05/27/2010  . OVERACTIVE BLADDER 03/12/2010  . Lumbar degenerative disc disease 04/13/2009  . GERD 10/15/2008  . CEREBROVASCULAR ACCIDENT, HX OF 10/15/2008  . ARTHRITIS, GENERALIZED 06/26/2008  . EXTERNAL HEMORRHOIDS 02/01/2008  . HIATAL HERNIA 02/01/2008  . DIVERTICULOSIS OF COLON 02/01/2008  . GASTRITIS, HX OF 02/01/2008  . DYSPNEA 09/19/2007  . Vitamin D deficiency 07/03/2007  . Osteoporosis of lumbar spine 06/13/2007  . Fatigue 06/13/2007  . HELICOBACTER PYLORI INFECTION 06/06/2006  . Hyperlipidemia 06/06/2006  . Generalized anxiety disorder 06/06/2006  . Adjustment disorder  with mixed anxiety and depressed mood 06/06/2006  . MACULAR DEGENERATION 06/06/2006  . H/O: CVA (cerebrovascular accident) 06/06/2006  . OSTEOARTHRITIS 06/06/2006   Past Medical History:  Diagnosis Date  . Alternating constipation and diarrhea   . Anxiety state, unspecified   . Complication of anesthesia    slow to wake up with surgery several years ago   . Depressive disorder, not elsewhere classified   . Diaphragmatic hernia  without mention of obstruction or gangrene   . Difficulty sleeping   . Diverticulosis of colon (without mention of hemorrhage)   . Esophageal reflux   . External hemorrhoids without mention of complication   . Fatigue   . Frequency of urination   . Helicobacter pylori (H. pylori)   . Hiatal hernia   . History of transfusion   . Lumbar spondylosis   . Macular degeneration (senile) of retina, unspecified   . Osteoarthrosis, unspecified whether generalized or localized, unspecified site 08-11-11   hx. "rhematoid arthritis", osteoarthritis, DDD, bursitis(hip)  . Osteoporosis   . Palpitations   . PONV (postoperative nausea and vomiting)   . Schatzki's ring   . Shortness of breath 08-11-11   with exertion only at present  . Unspecified cerebral artery occlusion with cerebral infarction 08-11-11   '97/ '06( TIA)-affected lt. side, slight weakness L side  . Unspecified vitamin D deficiency    Past Surgical History:  Procedure Laterality Date  . ABDOMINAL HYSTERECTOMY  08-11-11  . APPENDECTOMY    . BACK SURGERY  08-11-11   '11-hx. lumbar fusion with retained hardware  . CATARACT EXTRACTION  08-11-11   Bilateral  . ESOPHAGEAL DILATION    . KNEE SURGERY  08-11-11   rt. knee scope  . PATELLAR TENDON REPAIR Left 02/22/2014   Procedure: PATELLA TENDON REPAIR;  Surgeon: Gearlean Alf, MD;  Location: [facility];  Service: Orthopedics;  Laterality: Left;  . PATELLAR TENDON REPAIR Left 06/25/2014   Procedure: LEFT PATELLA TENDON REPAIR;  Surgeon: Gaynelle Arabian, MD;  Location: [facility];  Service: Orthopedics;  Laterality: Left;  . TOTAL KNEE ARTHROPLASTY  08/16/2011   Procedure: TOTAL KNEE ARTHROPLASTY;  Surgeon: Gearlean Alf, MD;  Location: [facility];  Service: Orthopedics;  Laterality: Right;  . TOTAL KNEE ARTHROPLASTY Left 01/27/2014   Procedure: LEFT TOTAL KNEE ARTHROPLASTY;  Surgeon: Gearlean Alf, MD;  Location: [facility];  Service: Orthopedics;  Laterality: Left;  . TUBAL LIGATION     Social History    Tobacco Use  . Smoking status: Never Smoker  . Smokeless tobacco: Never Used  Substance Use Topics  . Alcohol use: No    Alcohol/week: 0.0 standard drinks  . Drug use: No   Family History  Problem Relation Age of Onset  . Heart disease Father   . Coronary artery disease Brother   . Diabetes Brother   . Heart disease Brother   . Pancreatic cancer Daughter   . Diabetes Sister   . Colon cancer Neg Hx   . Colon polyps Neg Hx   . Esophageal cancer Neg Hx   . Gallbladder disease Neg Hx    Allergies  Allergen Reactions  . Alendronate Sodium     REACTION: JAW PAIN  . Alendronate Sodium Other (See Comments)    REACTION: JAW PAIN  . Celecoxib Other (See Comments)    REACTION: GI UPSET REACTION: GI UPSET  . Nexium [Esomeprazole Magnesium] Nausea And Vomiting    Pt cannot take the generic Does fine with DAW Pt cannot take  the generic Does fine with DAW  . Omeprazole Nausea Only  . Protonix [Pantoprazole Sodium] Nausea And Vomiting  . Rabeprazole Nausea And Vomiting  . Latex Itching and Rash   Current Outpatient Medications on File Prior to Visit  Medication Sig Dispense Refill  . acetaminophen (TYLENOL) 325 MG tablet Take 2 tablets (650 mg total) by mouth every 6 (six) hours as needed for mild pain (or Fever >/= 101). 40 tablet 0  . ALPRAZolam (XANAX) 0.5 MG tablet TAKE 1 TABLET AT BEDTIME AS NEEDED FOR SLEEP 90 tablet 0  . atorvastatin (LIPITOR) 10 MG tablet Take 1 tablet (10 mg total) by mouth daily. 90 tablet 3  . calcium carbonate (TUMS - DOSED IN MG ELEMENTAL CALCIUM) 500 MG chewable tablet Chew 1 tablet (200 mg of elemental calcium total) by mouth 4 (four) times daily as needed for indigestion or heartburn. 10 tablet 0  . clopidogrel (PLAVIX) 75 MG tablet TAKE 1 TABLET DAILY 90 tablet 1  . diclofenac sodium (VOLTAREN) 1 % GEL Apply 1 application topically 2 (two) times daily.    Marland Kitchen esomeprazole (NEXIUM) 40 MG capsule Take 1 capsule (40 mg total) by mouth 2 (two) times  daily before a meal. 180 capsule 3  . fluticasone (FLONASE) 50 MCG/ACT nasal spray Place 1-2 sprays into both nostrils daily as needed for allergies or rhinitis.    . furosemide (LASIX) 20 MG tablet Take 1 tablet (20 mg total) by mouth daily. 90 tablet 3  . HYDROcodone-acetaminophen (NORCO) 10-325 MG tablet Take 1 tablet by mouth every 6 (six) hours as needed.   0  . metoprolol succinate (TOPROL XL) 25 MG 24 hr tablet TAKE ONE-HALF (1/2) TABLET AT BEDTIME 45 tablet 3  . Multiple Vitamins-Minerals (PRESERVISION AREDS PO) Take 1 capsule by mouth 2 (two) times daily.    Vladimir Faster Glycol-Propyl Glycol (SYSTANE OP) Apply 1-2 drops to eye 3 (three) times daily as needed (dry eyes.).     Marland Kitchen promethazine (PHENERGAN) 6.25 MG/5ML syrup Take 5 mLs (6.25 mg total) by mouth every 6 (six) hours as needed (cough). 120 mL 0  . sertraline (ZOLOFT) 100 MG tablet Take 2 tablets (200 mg total) by mouth daily. (Patient taking differently: Take 100 mg by mouth 2 (two) times daily. ) 180 tablet 3   No current facility-administered medications on file prior to visit.    Review of Systems  Constitutional: Positive for chills and malaise/fatigue. Negative for fever.  HENT: Positive for sore throat. Negative for congestion, ear pain and sinus pain.   Eyes: Negative for blurred vision, discharge and redness.  Respiratory: Positive for cough and sputum production. Negative for shortness of breath, wheezing and stridor.   Cardiovascular: Negative for chest pain, palpitations and leg swelling.  Gastrointestinal: Negative for abdominal pain, diarrhea, nausea and vomiting.  Musculoskeletal: Negative for myalgias.  Skin: Negative for rash.  Neurological: Positive for headaches. Negative for dizziness.      Observations/Objective: Patient appears well, in no distress Weight is baseline  No facial swelling or asymmetry Normal voice-not hoarse and no slurred speech No obvious tremor or mobility impairment Moving neck and  UEs normally Able to hear the call well  No shortness of breath during the interview/ no wheezing Cough is mildly junky sounding  Talkative and mentally sharp with no cognitive changes No skin changes on face or neck , no rash or pallor Affect is normal    Assessment and Plan: Problem List Items Addressed This Visit  Other   COVID-19    Exposed 12/25, pos test 12/31 (symptoms started that day)  High risk pt 85 yo with heart and lung problems  Had UC visit and nl cxr  Pt has baseline 02 need and watches pulse ox No sob at all/no wheeze Monitoring her cough  Enc to continue to treat symptoms at home and isolate Fluids/rest Tylenol prn  pepto or immodium for diarrhea-only if needed  Enc pt to call or seek care if symptoms suddenly worsen or do not improve in the next 7-10 days           Follow Up Instructions: Continue to isolate, drink fluids and rest  Tylenol is best for pain/ headache or fever  Monitor your temperature and your pulse ox readings  Go to the ER if symptoms suddenly worsen   Watch for fever/ shortness of breath or signs of dehydration   Only use diarrhea medication over the counter as needed   Keep [place] updated    I discussed the assessment and treatment plan with the patient. The patient was provided an opportunity to ask questions and all were answered. The patient agreed with the plan and demonstrated an understanding of the instructions.   The patient was advised to call back or seek an in-person evaluation if the symptoms worsen or if the condition fails to improve as anticipated.     Loura Pardon, MD

## 2019-01-21 NOTE — Telephone Encounter (Signed)
Pt notified of Dr. Tower's comments and instructions and verbalized understanding  

## 2019-01-21 NOTE — Telephone Encounter (Signed)
Per chart review pt was seen at [facility] on 01/18/18. See note in this phone note from [facility] NP.

## 2019-01-21 NOTE — Patient Instructions (Signed)
Continue to isolate, drink fluids and rest  Tylenol is best for pain/ headache or fever  Monitor your temperature and your pulse ox readings  Go to the ER if symptoms suddenly worsen   Watch for fever/ shortness of breath or signs of dehydration   Only use diarrhea medication over the counter as needed   Keep [place] updated

## 2019-01-21 NOTE — Telephone Encounter (Signed)
Thanks for letting me know about her Covid diagnosis  How is she feeling?  It looks like she was ref to the infusion center -this is a good idea  Can she set up a virtual or phone visit with me to check in this week?

## 2019-01-21 NOTE — Telephone Encounter (Signed)
Also please see mychart messages family has sent

## 2019-01-21 NOTE — Telephone Encounter (Signed)
She may be better off to take tylenol by itself than the mucinex product (if she thinks it caused diarrhea)   of note-covid also can cause diarrhea  I will see her later today- if symptoms worsen and she needs to go to ER-please do

## 2019-01-21 NOTE — Assessment & Plan Note (Addendum)
Exposed 12/25, pos test 12/31 (symptoms started that day)  High risk pt 85 yo with heart and lung problems  Had UC visit and nl cxr  Pt has baseline 02 need and watches pulse ox No sob at all/no wheeze Monitoring her cough  Enc to continue to treat symptoms at home and isolate Fluids/rest Tylenol prn  pepto or immodium for diarrhea-only if needed  Enc pt to call or seek care if symptoms suddenly worsen or do not improve in the next 7-10 days

## 2019-01-21 NOTE — Telephone Encounter (Signed)
[facility] Night - Client TELEPHONE ADVICE RECORD [facility] Patient Name: [name] [name] Gender: Female DOB: [birth year] Age: 85 Y 2 D Return Phone Number: [phone] (Primary), [phone] (Secondary) Address: City/State/Zip: [place] [address] Client [facility] Night - Client Client Site [place] Physician Tower, Roque Lias - MD Contact Type Call Who Is Calling Patient / Member / Family / Caregiver Call Type Triage / Clinical Caller Name Pamelia Hoit Relationship To Patient Daughter Return Phone Number [phone] (Primary) Chief Complaint Nasal Congestion Reason for Call Symptomatic / Request for [place] states her mother tested positive for COVID. Caller states mother has congestion and is on O2. She uses a walker as well. Additional Comment Daughter said call the primary number to reach mom directly. They are not together. Secondary number is for the daughter. Translation No Nurse Assessment Nurse: Sherryle Lis, RN, [place] Date/Time (Eastern Time): 01/19/2019 9:26:42 AM Confirm and document reason for call. If symptomatic, describe symptoms. ---Pt tested positive for COVID today and has O2. Pt has alot of congestion and has been lightheaded with chills. Pt hasnt taken her temperature. (She uses a walker and has a hard time getting around). Has the patient had close contact with a person known or suspected to have the novel coronavirus illness OR traveled / lives in area with major community spread (including international travel) in the last 14 days from the onset of symptoms? * If Asymptomatic, screen for exposure and travel within the last 14 days. ---Yes Does the patient have any new or worsening symptoms? ---Yes Will a triage be completed? ---Yes Related visit to physician within the last 2 weeks? ---No Does the PT have any chronic conditions? (i.e.  diabetes, asthma, this includes High risk factors for pregnancy, etc.) ---Yes List chronic conditions. ---Heart issue( according to her daughter) Is this a behavioral health or substance abuse call? ---No PLEASE NOTE: All timestamps contained within this report are represented as [place] Standard Time. CONFIDENTIALTY NOTICE: This fax transmission is intended only for the addressee. It contains information that is legally privileged, confidential or otherwise protected from use or disclosure. If you are not the intended recipient, you are strictly prohibited from reviewing, disclosing, copying using or disseminating any of this information or taking any action in reliance on or regarding this information. If you have received this fax in error, please notify us immediately by telephone so that we can arrange for its return to [place]. Phone: [phone], Toll-Free: [phone], Fax: [phone] Page: 2 of 2 Call Id: [id number] Guidelines Guideline Title Affirmed Question Affirmed Notes Nurse Date/Time Eilene Ghazi Time) Coronavirus (COVID-19) - Diagnosed or Suspected [1] HIGH RISK patient (e.g., age > 23 years, diabetes, heart or lung disease, weak immune system) AND [2] new or worsening symptoms McDonald, RN, [place] 01/19/2019 9:30:40 AM Disp. Time Eilene Ghazi Time) Disposition Final User 01/19/2019 9:40:24 AM Go to ED Now (or PCP triage) Yes McDonald, RN, [place] Disposition Overriden: Call PCP Now Override Reason: Patient's symptoms need a higher level of care Caller Disagree/Comply Comply Caller Understands Yes PreDisposition [place] Advice Given Per Guideline CALL PCP NOW: * You need to discuss this with your doctor (or NP/PA). * I'll page the on-call provider now. If you haven't heard from the provider (or me) within 30 minutes, call again. CALL BACK IF: * You become worse. GO TO ED NOW (OR PCP TRIAGE): * IF NO PCP (PRIMARY CARE PROVIDER) SECOND-LEVEL TRIAGE: You need to be  seen within  the next hour. Go to the [facility] at _____________ [place] as soon as you can. CARE ADVICE given per CORONAVIRUS (COVID-19) - DIAGNOSED OR SUSPECTED (Adult) guideline. Referrals REFERRED TO PCP OFFICE [facility] at [place]

## 2019-01-21 NOTE — Telephone Encounter (Signed)
Called pt she said the infusion center called her and told her she can't the infusion. She was to far along with her sxs to be a candidate for the infusion so she isn't getting it. Pt said she feels horrible. She said she has cough, congestion, a sever HA, weakness, back pain and severe diarrhea for the last 2 days. She has been taking Mucinex cold and flu OTC that has acetaminophen in it but since she started having diarrhea she stopped taking it thinking the medication could have caused the diarrhea so she didn't want to take it until she talked to [place]. Pt has a Iphone 8 so doxy appt scheduled today at 4:15. Pt aware but wants to know about if it's okay to take the Mucinex with her diarrhea before her appt at 4:15pm

## 2019-01-21 NOTE — Telephone Encounter (Signed)
Phone encounter in error 

## 2019-01-21 NOTE — Assessment & Plan Note (Signed)
No recent clinical changes

## 2019-01-23 ENCOUNTER — Ambulatory Visit (INDEPENDENT_AMBULATORY_CARE_PROVIDER_SITE_OTHER): Payer: Medicare Other

## 2019-01-23 ENCOUNTER — Ambulatory Visit (HOSPITAL_COMMUNITY)
Admission: EM | Admit: 2019-01-23 | Discharge: 2019-01-23 | Disposition: A | Discharge status: Home / Self Care | Payer: Medicare Other | Attending: Family Medicine | Admitting: Family Medicine

## 2019-01-23 ENCOUNTER — Telehealth: Payer: Self-pay | Admitting: *Deleted

## 2019-01-23 ENCOUNTER — Encounter (INDEPENDENT_AMBULATORY_CARE_PROVIDER_SITE_OTHER): Payer: Self-pay

## 2019-01-23 ENCOUNTER — Telehealth: Payer: Self-pay

## 2019-01-23 ENCOUNTER — Other Ambulatory Visit: Payer: Self-pay

## 2019-01-23 ENCOUNTER — Encounter (HOSPITAL_COMMUNITY): Payer: Self-pay | Admitting: Emergency Medicine

## 2019-01-23 DIAGNOSIS — U071 COVID-19: Secondary | ICD-10-CM

## 2019-01-23 DIAGNOSIS — R05 Cough: Secondary | ICD-10-CM

## 2019-01-23 NOTE — ED Notes (Signed)
Pt left AMA due to wait time. Pt sts " you can call me with the results."

## 2019-01-23 NOTE — Telephone Encounter (Signed)
Aware and agree with advisement- I will watch out for correspondence from the UC

## 2019-01-23 NOTE — ED Triage Notes (Signed)
Pt here for continued symptoms from Covid.  Pt was told by here PCP to come here to be evaluated for the headache and her continued cough that is productive and green.

## 2019-01-23 NOTE — Telephone Encounter (Signed)
Patient returning our call regarding her positive Covid 19 results. She has already been in contact with her pcp she reports.  While staying home and away from others  drink lots of fluids to stay hydrated and rest. Any concerns with your breathing call your doctor or seek treatment at the ED with a mask on. At the end of the 10 days you can discontinue isolation as long as no fever/fever-reducing medicine over the last 48 hours and all other symptoms are improving at that time. Your local health department will be in contact with you, soon regarding your positive Covid 19 results. Will notify local health department.

## 2019-01-23 NOTE — Telephone Encounter (Signed)
Left a vm for patient to callback regarding questionnaire. Will also send a message in [place]

## 2019-01-23 NOTE — Telephone Encounter (Signed)
Patient called stating that she has been diagnosed as having covid. Patient stated that there have been changes in her condition overnight. Patient stated that she feels much worse today. Patient stated that she has a really bad cough, a lot of phlegm that was white and has turned green. Patient is concerned that she may be getting an infection. Patient stated that she has a severe headache today. Patient stated that her blood pressure is 108/70, pulse 81 and oxygen level is 94%. Patient stated that she does not have a fever or SOB, but concerned about an infection. After speaking with Dr. Glori Bickers patient was advised that she does need to be evaluated since she is worse today and concerned about an infection. Patient was advised that we can scheduled her an appointment with the respiratory clinic or she can go to an Urgent Care. Patient stated that she will contact her daughter and get her to take her back to the Urgent Care that she went to the other day.

## 2019-01-24 ENCOUNTER — Encounter: Payer: Self-pay | Admitting: Family Medicine

## 2019-01-24 ENCOUNTER — Encounter (INDEPENDENT_AMBULATORY_CARE_PROVIDER_SITE_OTHER): Payer: Self-pay

## 2019-01-24 NOTE — Telephone Encounter (Signed)
She is a week out from her diagnosis of vocid. She is 84. She might be a candidate for outpatient antibody. Please call her immediately and also refer to the infusionprogram - I think is Dr Lyda Jester and Laz[facility] Arms. THey need to risk stratify her. She only has< 3 days to go to get the antibody

## 2019-01-24 NOTE — ED Provider Notes (Signed)
[place]   [id number] 01/23/19 Arrival Time: [id number]  ASSESSMENT & PLAN:  1. COVID-19 virus infection     RN reports patient left before x-ray results available. Reports patient stated that she did not want to wait any longer.  I have personally viewed the imaging studies ordered this visit. CXR appears normal; no PNA.  Greater than 30 min spent on day of visit with patient, chart review, inputting orders and reviewing orders.   Reviewed expectations re: course of current medical issues. Questions answered. Outlined signs and symptoms indicating need for more acute intervention. Patient verbalized understanding. After Visit Summary given.   SUBJECTIVE: History from: patient.  [name] [name] is a 85 y.o. female who was recently dx with COVID. Told by PCP to come for evaluation. Occasional headaches. Feeling fatigued. Persistnent cough that has become more productive over the past couple of days. No SOB/CP. Normal PO intake without n/v. Ambulatory without difficulty. Afebrile. OTC treatment: OTC various without much relief.    Social History   Tobacco Use  Smoking Status Never Smoker  Smokeless Tobacco Never Used    ROS: As per HPI.   OBJECTIVE:  Vitals:   01/23/19 1556  BP: 128/76  Pulse: 97  Resp: 18  Temp: 98.5 F (36.9 C)  TempSrc: Oral  SpO2: 99%     General appearance: alert; appears fatigued HEENT: nasal congestion Neck: supple without LAD CV: RRR Lungs: unlabored respirations, symmetrical air entry without wheezing; cough: mild here; speaks full sentences without difficulty Abd: soft Ext: no LE edema Skin: warm and dry Psychological: alert and cooperative; normal mood and affect  Imaging: DG Chest 2 View  Result Date: 01/23/2019 CLINICAL DATA:  COVID-19 pneumonia. EXAM: CHEST - 2 VIEW COMPARISON:  01/19/2019 FINDINGS: There is no pneumothorax. No large pleural effusion. There is atelectasis at the lung bases. No large focal  infiltrate. Aortic calcifications are noted. The heart size is borderline enlarged. IMPRESSION: No active cardiopulmonary disease. Electronically Signed   By: Constance Holster M.D.   On: 01/23/2019 16:59    Allergies  Allergen Reactions  . Alendronate Sodium     REACTION: JAW PAIN  . Alendronate Sodium Other (See Comments)    REACTION: JAW PAIN  . Celecoxib Other (See Comments)    REACTION: GI UPSET REACTION: GI UPSET  . Nexium [Esomeprazole Magnesium] Nausea And Vomiting    Pt cannot take the generic Does fine with DAW Pt cannot take the generic Does fine with DAW  . Omeprazole Nausea Only  . Protonix [Pantoprazole Sodium] Nausea And Vomiting  . Rabeprazole Nausea And Vomiting  . Latex Itching and Rash    Past Medical History:  Diagnosis Date  . Alternating constipation and diarrhea   . Anxiety state, unspecified   . Complication of anesthesia    slow to wake up with surgery several years ago   . Depressive disorder, not elsewhere classified   . Diaphragmatic hernia without mention of obstruction or gangrene   . Difficulty sleeping   . Diverticulosis of colon (without mention of hemorrhage)   . Esophageal reflux   . External hemorrhoids without mention of complication   . Fatigue   . Frequency of urination   . Helicobacter pylori (H. pylori)   . Hiatal hernia   . History of transfusion   . Lumbar spondylosis   . Macular degeneration (senile) of retina, unspecified   . Osteoarthrosis, unspecified whether generalized or localized, unspecified site 08-11-11   hx. "rhematoid arthritis", osteoarthritis, DDD, bursitis(hip)  .  Osteoporosis   . Palpitations   . PONV (postoperative nausea and vomiting)   . Schatzki's ring   . Shortness of breath 08-11-11   with exertion only at present  . Unspecified cerebral artery occlusion with cerebral infarction 08-11-11   '97/ '06( TIA)-affected lt. side, slight weakness L side  . Unspecified vitamin D deficiency    Family History   Problem Relation Age of Onset  . Heart disease Father   . Coronary artery disease Brother   . Diabetes Brother   . Heart disease Brother   . Pancreatic cancer Daughter   . Diabetes Sister   . Colon cancer Neg Hx   . Colon polyps Neg Hx   . Esophageal cancer Neg Hx   . Gallbladder disease Neg Hx    Social History   Socioeconomic History  . Marital status: Legally Separated    Spouse name: Not on file  . Number of children: 3  . Years of education: Not on file  . Highest education level: Not on file  Occupational History  . Occupation: disabled    Employer: RETIRED  Tobacco Use  . Smoking status: Never Smoker  . Smokeless tobacco: Never Used  Substance and Sexual Activity  . Alcohol use: No    Alcohol/week: 0.0 standard drinks  . Drug use: No  . Sexual activity: Never  Other Topics Concern  . Not on file  Social History Narrative  . Not on file   Social Determinants of Health   Financial Resource Strain:   . Difficulty of Paying Living Expenses: Not on file  Food Insecurity:   . Worried About Charity fundraiser in the Last Year: Not on file  . Ran Out of Food in the Last Year: Not on file  Transportation Needs:   . Lack of Transportation (Medical): Not on file  . Lack of Transportation (Non-Medical): Not on file  Physical Activity:   . Days of Exercise per Week: Not on file  . Minutes of Exercise per Session: Not on file  Stress:   . Feeling of Stress : Not on file  Social Connections:   . Frequency of Communication with Friends and Family: Not on file  . Frequency of Social Gatherings with Friends and Family: Not on file  . Attends Religious Services: Not on file  . Active Member of Clubs or Organizations: Not on file  . Attends Archivist Meetings: Not on file  . Marital Status: Not on file  Intimate Partner Violence:   . Fear of Current or Ex-Partner: Not on file  . Emotionally Abused: Not on file  . Physically Abused: Not on file  . Sexually  Abused: Not on file           Vanessa Kick, MD 01/24/19 1313

## 2019-01-24 NOTE — Telephone Encounter (Signed)
Spoke with patient regarding Monoclonal antibody infusion . Patient is in agreement but would need to have it on 1/8 as will be out of window on 01/27/19 .  She was placed on cancellation list if opening comes available .   Please contact office for sooner follow up if symptoms do not improve or worsen or seek emergency care

## 2019-01-24 NOTE — Telephone Encounter (Signed)
MR please advise. Patient can be scheduled at the respiratory if you feel she needs to be evaluated. Thank you.

## 2019-01-24 NOTE — Telephone Encounter (Signed)
I called pt and she agrees to get the infusion. I will place referral to [facility].

## 2019-01-24 NOTE — Telephone Encounter (Signed)
Dr.Tower asked me to schedule patient at [facility] respiratory clinic.  I called patient and she said she can't go to the clinic because the appointment is too late in the evening (6:15). I spoke to Dr.Tower and she said patient should go to urgent care. Patient said she'll try resting and if she gets worse she'll go to Urgent Care or call me back to schedule an appointment at the respiratory clinic.

## 2019-01-24 NOTE — Telephone Encounter (Signed)
Spoke with pt and advise her of Dr. Marliss Coots comments on her mychart. I did explain her xray with her. Pt said she isn't in respiratory distress no SOB she said her main issue is she feels overall bad and she is nauseous. Pt said her temp is 98.2, O2 is 97 and pulse is 69. Pt does want an appt with the respiratory clinic, I advise her our office will call to set that up. I did try and explain that Covid is a virus but pt still was asking about abx. I advise her that the respiratory clinic can go over her xray in detail with her and also talk to her about treatments other then abx, please proceed with setting up her an appt.

## 2019-01-24 NOTE — Telephone Encounter (Signed)
Patient called this morning asking if we received her message and Urgent Care notes. Advised that I do see some information but not sure if Dr Glori Bickers had a chance to see it yet. Patient asks for call back on what to do. She is worried. She states she left urgent care before been treated all the way because she felt so bad. I also advised patient to call back to her pulmonologist that she sent message to about this also. Please review.

## 2019-01-24 NOTE — Telephone Encounter (Signed)
Dr. Glori Bickers please see my message on the other mychart I sent you, pt didn't believe me regarding her not needed an abx for covid, or answering her questions regarding her xray done. please send pt a new mychart message or call pt directly to answering her questions

## 2019-01-24 NOTE — Telephone Encounter (Signed)
[facility] - Client TELEPHONE ADVICE RECORD [facility] Patient Name: [name] [name] Gender: Female DOB: 07/04/1934 Age: 85 Y 7 D Return Phone Number: [phone] (Primary) [place] [address] Client [facility] - Client Client Site [place] Physician Tower, Roque Lias - MD Contact Type Call Who Is Calling Patient / Member / Family / Caregiver Call Type Triage / Clinical Caller Name Sheree Relationship To Patient Daughter Return Phone Number [phone] (Primary) Chief Complaint Cough Reason for Call Symptomatic / Request for Jerome states her mother has covid/pneumonia and is experiencing a cough/green mucus. Translation No Nurse Assessment Guidelines Guideline Title Affirmed Question Affirmed Notes Nurse Date/Time (Eastern Time) Disp. Time Eilene Ghazi Time) Disposition Final User 01/23/2019 8:34:33 PM Send To Call Back Waiting For Nurse Carlis Stable 01/23/2019 8:47:04 PM Send To RN Personal Pearletha Alfred, RN, Kaila 01/23/2019 8:49:20 PM Attempt made - message left Olevia Bowens, RN, Lauren 01/23/2019 9:03:56 PM FINAL ATTEMPT MADE - message left Yes Olevia Bowens, RN, Lauren

## 2019-01-25 ENCOUNTER — Other Ambulatory Visit: Payer: Self-pay | Admitting: Nurse Practitioner

## 2019-01-25 ENCOUNTER — Ambulatory Visit (HOSPITAL_COMMUNITY)
Admission: RE | Admit: 2019-01-25 | Discharge: 2019-01-25 | Disposition: A | Discharge status: Home / Self Care | Payer: Medicare Other | Source: Ambulatory Visit | Attending: Pulmonary Disease | Admitting: Pulmonary Disease

## 2019-01-25 DIAGNOSIS — Z23 Encounter for immunization: Secondary | ICD-10-CM | POA: Diagnosis not present

## 2019-01-25 DIAGNOSIS — U071 COVID-19: Secondary | ICD-10-CM

## 2019-01-25 MED ORDER — FAMOTIDINE IN NACL 20-0.9 MG/50ML-% IV SOLN: 20.0000 mg | Freq: Once | INTRAVENOUS | Status: DC | PRN

## 2019-01-25 MED ORDER — ALBUTEROL SULFATE HFA 108 (90 BASE) MCG/ACT IN AERS: 2.0000 | INHALATION_SPRAY | Freq: Once | RESPIRATORY_TRACT | Status: DC | PRN

## 2019-01-25 MED ORDER — SODIUM CHLORIDE 0.9 % IV SOLN: INTRAVENOUS | Status: DC | PRN

## 2019-01-25 MED ORDER — EPINEPHRINE 0.3 MG/0.3ML IJ SOAJ: 0.3000 mg | Freq: Once | INTRAMUSCULAR | Status: DC | PRN

## 2019-01-25 MED ORDER — SODIUM CHLORIDE 0.9 % IV SOLN
700.0000 mg | Freq: Once | INTRAVENOUS | Status: AC
  Administered 2019-01-25: 700 mg via INTRAVENOUS
  Filled 2019-01-25: qty 20

## 2019-01-25 MED ORDER — METHYLPREDNISOLONE SODIUM SUCC 125 MG IJ SOLR: 125.0000 mg | Freq: Once | INTRAMUSCULAR | Status: DC | PRN

## 2019-01-25 MED ORDER — DIPHENHYDRAMINE HCL 50 MG/ML IJ SOLN: 50.0000 mg | Freq: Once | INTRAMUSCULAR | Status: DC | PRN

## 2019-01-25 NOTE — Progress Notes (Signed)
  Diagnosis: COVID-19  Physician: Dr. Joya Gaskins  Procedure: Covid Infusion Clinic Med: bamlanivimab infusion - Provided patient with bamlanimivab fact sheet for patients, parents and caregivers prior to infusion.  Complications: No immediate complications noted.  Discharge: Discharged home   [name] [name] 01/25/2019

## 2019-01-25 NOTE — Progress Notes (Signed)
  I connected by phone with [name] [name] on 01/25/2019 at 10:36 AM to discuss the potential use of an new treatment for mild to moderate COVID-19 viral infection in non-hospitalized patients.  This patient is a 85 y.o. female that meets the FDA criteria for Emergency Use Authorization of bamlanivimab or casirivimab\imdevimab.  Has a (+) direct SARS-CoV-2 viral test result  Has mild or moderate COVID-19   Is ? 85 years of age and weighs ? 40 kg  Is NOT hospitalized due to COVID-19  Is NOT requiring oxygen therapy or requiring an increase in baseline oxygen flow rate due to COVID-19  Is within 10 days of symptom onset  Has at least one of the high risk factor(s) for progression to severe COVID-19 and/or hospitalization as defined in EUA.  Specific high risk criteria : >/= 83 yo   I have spoken and communicated the following to the patient or parent/caregiver:  1. FDA has authorized the emergency use of bamlanivimab and casirivimab\imdevimab for the treatment of mild to moderate COVID-19 in adults and pediatric patients with positive results of direct SARS-CoV-2 viral testing who are 70 years of age and older weighing at least 40 kg, and who are at high risk for progressing to severe COVID-19 and/or hospitalization.  2. The significant known and potential risks and benefits of bamlanivimab and casirivimab\imdevimab, and the extent to which such potential risks and benefits are unknown.  3. Information on available alternative treatments and the risks and benefits of those alternatives, including clinical trials.  4. Patients treated with bamlanivimab and casirivimab\imdevimab should continue to self-isolate and use infection control measures (e.g., wear mask, isolate, social distance, avoid sharing personal items, clean and disinfect "high touch" surfaces, and frequent handwashing) according to CDC guidelines.   5. The patient or parent/caregiver has the option to accept or refuse  bamlanivimab or casirivimab\imdevimab .  After reviewing this information with the patient, The patient agreed to proceed with receiving the bamlanimivab infusion and will be provided a copy of the Fact sheet prior to receiving the infusion.Fenton Foy 01/25/2019 10:36 AM

## 2019-01-25 NOTE — Discharge Instructions (Signed)

## 2019-02-01 ENCOUNTER — Other Ambulatory Visit: Payer: Self-pay | Admitting: Family Medicine

## 2019-02-06 ENCOUNTER — Other Ambulatory Visit: Payer: Self-pay

## 2019-02-06 ENCOUNTER — Ambulatory Visit (INDEPENDENT_AMBULATORY_CARE_PROVIDER_SITE_OTHER): Payer: Medicare Other | Admitting: Internal Medicine

## 2019-02-06 ENCOUNTER — Encounter: Payer: Self-pay | Admitting: Internal Medicine

## 2019-02-06 VITALS — BP 122/62 | HR 78 | Temp 97.5°F | Ht 64.5 in | Wt 191.0 lb

## 2019-02-06 DIAGNOSIS — G4734 Idiopathic sleep related nonobstructive alveolar hypoventilation: Secondary | ICD-10-CM | POA: Diagnosis not present

## 2019-02-06 DIAGNOSIS — R0602 Shortness of breath: Secondary | ICD-10-CM | POA: Diagnosis not present

## 2019-02-06 DIAGNOSIS — U071 COVID-19: Secondary | ICD-10-CM | POA: Diagnosis not present

## 2019-02-06 NOTE — Patient Instructions (Addendum)
ICD-10-CM   1. Nocturnal hypoxia  G47.34   2. DYSPNEA  R06.02   3. COVID-19 virus detected  U07.1    Glad you are recovered from COVID-19 infection on January 17, 2019  I think the outpatient antibody treatment helped  I am also glad that your shortness of breath is much better than a year ago and  mood is more upbeat  Recent chest x-ray January 2021 is clear    Plan -Follow-up in 1 year or sooner if needed

## 2019-02-06 NOTE — Progress Notes (Signed)
PCP Loura Pardon, MD  HPI  OV 08/05/2015  Chief Complaint  Patient presents with  . Advice Only    Referred for cough by Dr. Glori Bickers.  Pt c/o worsening sob, cough X3-4 months.       85 year old female who is the wife of my patient Mr. [name] [name] was idiopathic pulmonary fibrosis. Patient's granddaughter tells he is also friends with Dr. Legrand Como wert's daughter. She's been referred for cough and dyspnea. Her husband is extremely concerned that she also has pulmonary fibrosis due to common exposures. Patient denies any family history of pulmonary fibrosis. Reports insidious onset of shortness of breath and cough simultaneously approximately 3 months ago. It is progressive. Moderate in intensity. Cough quality is dry. Both symptoms are present with exertion relieved by rest. Walking across the hallway in the room with her baseline walker use produces dyspnea [she uses walker because of knee and back issues for the last few years] there is no associated syncope or wheezing. She did have a cardiac stress is 06/24/2015 by Dr. Sherren Mocha I review this result myself and this is normal with an echocardiogram normal LV function. Last chest x-ray in our health system was 01/20/2014 with clear lung fields personally visualized the chest x-ray. Lab work shows hemoglobin of 12.1 g percent or greater 2017 and a creatinine of 1.06 mg percent June 2016.   Social history: Her husband likely renal stone has been admitted at [facility] long  Walking desaturation test 185 feet 3 labs on room air: Walked only 1 lap and stopped due to knee pain. She is a walker to walk. For this discussion she did not desaturate.  reports that she has never smoked. She has never used smokeless tobacco.  OV 09/02/2015  Chief Complaint  Patient presents with  . Follow-up    Pt here after HRCT, ONO, and PFT. Pt states her breathing is unchanged since last OV. Pt c/o morning prod cough. Pt denies CP/tightness and f/c/s.       Here for shortness of breath follow-up. No new issues.She is frustrated. She has developed carpal tunnel syndrome because she's been using the walker for several years.  06/24/2015 2-D echocardiogram: Grade 2 diastolic dysfunction with normal left ventricular ejection systolic fraction. ISclerosis without stenosis  06/24/2015 [facility] cardiac stress test: Low risk nuclear scan  08/07/2015: Pulmonary function test normal except for mild reduction in diffusion capacity to 18.87/70% isolated reduction in diffusion capacity    08/11/2015 oxygen overnight desaturation test: Terrial Rhodes know that total time spent with a pulse ox while sleeping at night less than or equal to 88% was 2.7 minutes. This means she does not qualify or need nocturnal oxygen. I date of test 08/11/2015  09/02/2015 = feno 18 and no assthma   08/18/2015: CT chest 08/18/2015 Musculoskeletal: Chronic appearing compression fracture of T11 with approximately 40% loss of anterior vertebral body height is noted. There are no aggressive appearing lytic or blastic lesions noted in the visualized portions of the skeleton.  IMPRESSION:CT chest 08/18/2015 1. No definite evidence of interstitial lung disease.  2. There is mild post infectious or inflammatory scarring in the inferior segment of the lingula, and there are some scattered areas of mild cylindrical bronchiectasis in the lung bases bilaterally. 3. Aortic atherosclerosis, in addition to left anterior descending coronary artery disease. Assessment for potential risk factor modification, dietary therapy or pharmacologic therapy may be warranted, if clinically indicated. 4. There are calcifications of the aortic  valve. Echocardiographic correlation for evaluation of potential valvular dysfunction may be warranted if clinically indicated.   Electronically Signed   By: Vinnie Langton M.D.   On: 08/18/2015 15:08   OV 03/07/2018  Subjective:   Patient ID: [name] [name], female , DOB: 1934/09/18 , age 18 y.o. , MRN: [id number] , ADDRESS: [id number] [place] [facility] [address]   03/07/2018 -   Chief Complaint  Patient presents with  . Follow-up    Pt had a recent echo 2/7 and cxr performed 1/29. Pt states she has had problems with SOB that happens anytime with walking. States she becomes SOB very easily. Also has complaints of tightness in chest on the right side of her chest. Denies any problems with cough.     HPI [name] [name] 50 y.o. -wife of my disease patient Mr Donn Pierini who died from IPF in 21-Oct-2016.  I personally not seen the patient herself in 2-1/2 years.  She saw me back then for dyspnea on exertion relieved by rest.  We put it down to diastolic dysfunction.  Subsequently after her husband's death she was lost to follow-up.  She tells me since then dyspnea is gradually deteriorated and particularly in the last 6 months there is been significant worsening.  It appears that she is significantly dyspneic at rest and even doing simple tasks.  She is so dyspneic that she is unable to do even tasks like household work or shopping or walking up stairs.  She has extreme associated fatigue but only a mild cough.  All of this is documented below.  Symptoms are severe.  There is no wheezing.  She saw Dr. Burt Knack cardiology.  I reviewed the record.  It appears in the last 1-2 months her echocardiogram showed diastolic dysfunction but a cardiac stress test was normal.  She had a chest x-ray that I personally visualized and shows increased AP diameter from kyphosis but otherwise clear lung fields.  She had blood work which shows normal creatinine and hemoglobin.  She has been using a walker for the last 2 to 3 years because of knee and back issues which are chronic.   SYMPTOM SCALE -  03/07/2018   O2 use none  Shortness of Breath 0 -> 5 scale with 5 being worst (score 6 If unable to do)  At rest 4  Simple tasks - showers,  clothes change, eating, shaving 5  Household (dishes, doing bed, laundry) 6 - unable  Shopping 6 - unable  Walking level at own pace 6- unable  Walking keeping up with others of same age 45 - uanble  Walking up Stairs 6- unable  Walking up [place] 6- unable  Total (40 - 48) Dyspnea Score 61  How bad is your cough? 1  - mild clear  How bad is your fatigue 5    Walking desaturation test in our office today 185 feet x 3 laps on room air using a forehead probe and using a walker: Had R chest heaviness during walk. HR 64 -> 86, pulsoe ox 100% -> 98% and essentially normal. STopped x 2 to complete due to dyspnea  Thyroid function studies January 2020 is normal Okay ROS - per HPI  OV 02/06/2019  Subjective:  Patient ID: [name] [name], female , DOB: 02/15/1934 , age 43 y.o. , MRN: [id number] , ADDRESS: [address] [place] [address]   02/06/2019 -   Chief Complaint  Patient presents with  . Follow-up  post covid exposure, feeling better     HPI ANH WINFREY 85 y.o. -she has chronic multifactorial dyspnea from kyphosis and diastolic dysfunction and has nocturnal hypoxemia.  1 year ago in February 2020 when I saw her she did not interstitial lung disease on her high-resolution CT chest.  She does have some moderate hiatal hernia and she is on Nexium.  On Christmas day her sister visited her.  Other than that there was no other clustering.  But she ended up getting COVID-19 from her sister.  She called [place] and she then ended up getting output sheet antibody infusion.  She says this is really helped her.  She is recovered faster than her sister who did not get the outpatient antibody infusion.  She is almost back to normal.  Her granddaughter Hyman Hopes is with her.   CT CHEST HIGH RESOLUTIN FEb 2020  IMPRESSION: 1. No definitive evidence of interstitial lung disease. Air trapping is indicative of small airways disease. 2. Cholelithiasis. 3. Moderate hiatal  hernia. 4. Aortic atherosclerosis (ICD10-170.0). Coronary artery calcification.   Electronically Signed   By: Lorin Picket M.D.   On: 03/14/2018 09:52   ROS - per HPI     has a past medical history of Alternating constipation and diarrhea, Anxiety state, unspecified, Complication of anesthesia, Depressive disorder, not elsewhere classified, Diaphragmatic hernia without mention of obstruction or gangrene, Difficulty sleeping, Diverticulosis of colon (without mention of hemorrhage), Esophageal reflux, External hemorrhoids without mention of complication, Fatigue, Frequency of urination, Helicobacter pylori (H. 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   reports that she has never smoked. She has never used smokeless tobacco.  Past Surgical History:  Procedure Laterality Date  . ABDOMINAL HYSTERECTOMY  08-11-11  . APPENDECTOMY    . BACK SURGERY  08-11-11   '11-hx. lumbar fusion with retained hardware  . CATARACT EXTRACTION  08-11-11   Bilateral  . ESOPHAGEAL DILATION    . KNEE SURGERY  08-11-11   rt. knee scope  . PATELLAR TENDON REPAIR Left 02/22/2014   Procedure: PATELLA TENDON REPAIR;  Surgeon: Gearlean Alf, MD;  Location: [facility];  Service: Orthopedics;  Laterality: Left;  . PATELLAR TENDON REPAIR Left 06/25/2014   Procedure: LEFT PATELLA TENDON REPAIR;  Surgeon: Gaynelle Arabian, MD;  Location: [facility];  Service: Orthopedics;  Laterality: Left;  . TOTAL KNEE ARTHROPLASTY  08/16/2011   Procedure: TOTAL KNEE ARTHROPLASTY;  Surgeon: Gearlean Alf, MD;  Location: [facility];  Service: Orthopedics;   Laterality: Right;  . TOTAL KNEE ARTHROPLASTY Left 01/27/2014   Procedure: LEFT TOTAL KNEE ARTHROPLASTY;  Surgeon: Gearlean Alf, MD;  Location: [facility];  Service: Orthopedics;  Laterality: Left;  . TUBAL LIGATION      Allergies  Allergen Reactions  . Alendronate Sodium     REACTION: JAW PAIN  . Alendronate Sodium Other (See Comments)    REACTION: JAW PAIN  . Celecoxib Other (See Comments)    REACTION: GI UPSET REACTION: GI UPSET  . Nexium [Esomeprazole Magnesium] Nausea And Vomiting    Pt cannot take the generic Does fine with DAW Pt cannot take the generic Does fine with DAW  . Omeprazole Nausea Only  . Protonix [Pantoprazole Sodium] Nausea And Vomiting  . Rabeprazole  Nausea And Vomiting  . Latex Itching and Rash    Immunization History  Administered Date(s) Administered  . Influenza Split 10/17/2011  . Influenza Whole 10/24/2003, 10/19/2006, 10/15/2007, 11/15/2008, 10/17/2009  . Influenza, High Dose Seasonal PF 10/25/2016, 09/28/2017  . Influenza, Seasonal, Injecte, Preservative Fre 09/26/2018  . Influenza,inj,Quad PF,6+ Mos 09/19/2012, 11/26/2013  . Influenza-Unspecified 11/10/2014, 09/18/2015  . Pneumococcal Conjugate-13 05/07/2013  . Pneumococcal Polysaccharide-23 01/03/2002, 06/13/2007  . Td 10/09/1996, 06/13/2007  . Zoster 03/21/2013  . Zoster Recombinat (Shingrix) 09/26/2018, 12/19/2018    Family History  Problem Relation Age of Onset  . Heart disease Father   . Coronary artery disease Brother   . Diabetes Brother   . Heart disease Brother   . Pancreatic cancer Daughter   . Diabetes Sister   . Colon cancer Neg Hx   . Colon polyps Neg Hx   . Esophageal cancer Neg Hx   . Gallbladder disease Neg Hx      Current Outpatient Medications:  .  acetaminophen (TYLENOL) 325 MG tablet, Take 2 tablets (650 mg total) by mouth every 6 (six) hours as needed for mild pain (or Fever >/= 101)., Disp: 40 tablet, Rfl: 0 .  ALPRAZolam (XANAX) 0.5 MG tablet, TAKE 1 TABLET  AT BEDTIME AS NEEDED FOR SLEEP, Disp: 90 tablet, Rfl: 0 .  atorvastatin (LIPITOR) 10 MG tablet, Take 1 tablet (10 mg total) by mouth daily., Disp: 90 tablet, Rfl: 3 .  calcium carbonate (TUMS - DOSED IN MG ELEMENTAL CALCIUM) 500 MG chewable tablet, Chew 1 tablet (200 mg of elemental calcium total) by mouth 4 (four) times daily as needed for indigestion or heartburn., Disp: 10 tablet, Rfl: 0 .  clopidogrel (PLAVIX) 75 MG tablet, TAKE 1 TABLET DAILY, Disp: 90 tablet, Rfl: 1 .  diclofenac sodium (VOLTAREN) 1 % GEL, Apply 1 application topically 2 (two) times daily., Disp: , Rfl:  .  esomeprazole (NEXIUM) 40 MG capsule, Take 1 capsule (40 mg total) by mouth 2 (two) times daily before a meal., Disp: 180 capsule, Rfl: 3 .  fluticasone (FLONASE) 50 MCG/ACT nasal spray, Place 1-2 sprays into both nostrils daily as needed for allergies or rhinitis., Disp: , Rfl:  .  furosemide (LASIX) 20 MG tablet, Take 1 tablet (20 mg total) by mouth daily., Disp: 90 tablet, Rfl: 3 .  HYDROcodone-acetaminophen (NORCO) 10-325 MG tablet, Take 1 tablet by mouth every 6 (six) hours as needed. , Disp: , Rfl: 0 .  metoprolol succinate (TOPROL XL) 25 MG 24 hr tablet, TAKE ONE-HALF (1/2) TABLET AT BEDTIME, Disp: 45 tablet, Rfl: 3 .  Multiple Vitamins-Minerals (PRESERVISION AREDS PO), Take 1 capsule by mouth 2 (two) times daily., Disp: , Rfl:  .  Polyethyl Glycol-Propyl Glycol (SYSTANE OP), Apply 1-2 drops to eye 3 (three) times daily as needed (dry eyes.). , Disp: , Rfl:  .  promethazine (PHENERGAN) 6.25 MG/5ML syrup, Take 5 mLs (6.25 mg total) by mouth every 6 (six) hours as needed (cough)., Disp: 120 mL, Rfl: 0 .  sertraline (ZOLOFT) 100 MG tablet, Take 2 tablets (200 mg total) by mouth daily. (Patient taking differently: Take 100 mg by mouth 2 (two) times daily. ), Disp: 180 tablet, Rfl: 3      Objective:   Vitals:   02/06/19 1153  BP: 122/62  Pulse: 78  Temp: (!) 97.5 F (36.4 C)  TempSrc: Oral  SpO2: 94%  Weight: 191  lb (86.6 kg)  Height: 5' 4.5" (1.638 m)    Estimated body mass index is  32.28 kg/m as calculated from the following:   Height as of this encounter: 5' 4.5" (1.638 m).   Weight as of this encounter: 191 lb (86.6 kg).  @WEIGHTCHANGE @  [employer]   02/06/19 1153  Weight: 191 lb (86.6 kg)     Physical Exam Elderly female with a walker.  Pleasant and cheerful.  Lungs are clear to auscultation normal heart sounds abdomen soft.  Alert and oriented x3.         Assessment:       ICD-10-CM   1. Nocturnal hypoxia  G47.34   2. DYSPNEA  R06.02   3. COVID-19 virus detected  U07.1        Plan:     Patient Instructions     ICD-10-CM   1. Nocturnal hypoxia  G47.34   2. DYSPNEA  R06.02   3. COVID-19 virus detected  U07.1    Glad you are recovered from COVID-19 infection on January 17, 2019  I think the outpatient antibody treatment helped  I am also glad that your shortness of breath is much better than a year ago and  mood is more upbeat  Recent chest x-ray January 2021 is clear  Plan -Follow-up in 1 year or sooner if needed  covid vaccine after 90 days  ( Level 02 visit: estb 10-19 minspent for this visit type which was visit type: on-site physical face to visit in total care time and counseling or/and coordination of care by this undersigned MD - Dr Brand Males. This includes one or more of the following for care delivered on 02/06/2019 same day: pre-charting, chart review, note writing, documentation discussion of test results, diagnostic or treatment recommendations, prognosis, risks and benefits of management options, instructions, education, compliance or risk-factor reduction. It excludes time spent by the [place] or office staff in the care of the patient. Actual time was 15 min)   SIGNATURE    Dr. Brand Males, M.D., F.C.C.P,  Pulmonary and Critical Care Medicine Staff Physician, [place] Director - Interstitial Lung Disease  Program    Pulmonary [facility] [place], [place], [address]  Pager: [phone], If no answer or between  15:00h - 7:00h: call [phone] Telephone: [phone]  12:35 PM 02/06/2019

## 2019-02-13 ENCOUNTER — Encounter: Payer: Medicare Other | Admitting: Family Medicine

## 2019-02-13 ENCOUNTER — Ambulatory Visit: Payer: Medicare Other

## 2019-02-15 DIAGNOSIS — M25531 Pain in right wrist: Secondary | ICD-10-CM | POA: Diagnosis not present

## 2019-02-20 ENCOUNTER — Encounter: Payer: Medicare Other | Admitting: Family Medicine

## 2019-03-01 ENCOUNTER — Telehealth: Payer: Self-pay

## 2019-03-01 NOTE — Telephone Encounter (Signed)
Patient l/m on triage line stating that she had [place] in January and was having severe headaches then but states she is still having headaches and wanted to know what can she take for that.

## 2019-03-01 NOTE — Telephone Encounter (Signed)
Tylenol (acetaminophen) is the safest thing.  Is she keeping hydrated? Any other neurologic symptoms that are new?  Is the headache throbbing or constant? One side of the head or both?   Thanks

## 2019-03-01 NOTE — Telephone Encounter (Signed)
Pt notified of Dr. Tower's comments and instructions and verbalized understanding  

## 2019-03-01 NOTE — Telephone Encounter (Signed)
Ice on area of head that hurts and heat on neck may help  Take only 1 regular strength tylenol at a time (since norco has 325 mg as well)  Keep [place] posted

## 2019-03-01 NOTE — Telephone Encounter (Signed)
Pt said she hasn't tried tylenol, she has Norco for back pain but she hasn't used anything when she has HA. Pt will try tylenol OTC.   Pt said she hasn't increased her water intake she is just "drinking her normal amount".  Pt said she doesn't have any neurologic sxs she is aware of but she does still have nausea, redness around her eyes, hot flashes, her back is hurting more then her normal, she feels like her head is in a "fog" sometimes, and she is still SOB, pt said one day she had to put her oxygen on for 1/2 a day but her O2 stats have stayed around 95%- 96%.  Pt said the HA comes and goes but when it comes is a throbbing feeling in the back of her head down her neck, pt said it can get so severe she has to stop doing whatever activity she is doing

## 2019-03-13 ENCOUNTER — Encounter: Payer: Self-pay | Admitting: Family Medicine

## 2019-03-13 ENCOUNTER — Other Ambulatory Visit: Payer: Self-pay | Admitting: Emergency Medicine

## 2019-03-13 ENCOUNTER — Ambulatory Visit
Admission: RE | Admit: 2019-03-13 | Discharge: 2019-03-13 | Disposition: A | Discharge status: Home / Self Care | Payer: Medicare Other | Source: Ambulatory Visit | Attending: Emergency Medicine | Admitting: Emergency Medicine

## 2019-03-13 ENCOUNTER — Telehealth: Payer: Self-pay | Admitting: *Deleted

## 2019-03-13 ENCOUNTER — Other Ambulatory Visit: Payer: Self-pay

## 2019-03-13 DIAGNOSIS — Z20822 Contact with and (suspected) exposure to covid-19: Secondary | ICD-10-CM | POA: Diagnosis not present

## 2019-03-13 DIAGNOSIS — G500 Trigeminal neuralgia: Secondary | ICD-10-CM

## 2019-03-13 DIAGNOSIS — J029 Acute pharyngitis, unspecified: Secondary | ICD-10-CM | POA: Diagnosis not present

## 2019-03-13 NOTE — Telephone Encounter (Signed)
Patient called stating that she started last night with terrible throat pain and ear pain. Patient stated that she has a cough and a fever of 100.0. Patient stated that her pain level is a 10. Patient stated that her daughter is on the way to get her to take her to an Urgent Care. Patient stated that she does not want to go to an urgent care. Advised patient that she really needs a hands on visit so that someone can look into her throat and ears. Patient agreed that she will go to the Urgent Care when  her daughter gets there. Advised patient that a note will go back to Dr. Glori Bickers so that she will know what is going on.

## 2019-03-13 NOTE — Telephone Encounter (Signed)
Thanks for letting me know, I agree she needs to be seen asap and that is the best way I will watch out for messages/correspondence

## 2019-03-15 ENCOUNTER — Encounter: Payer: Self-pay | Admitting: Family Medicine

## 2019-03-15 ENCOUNTER — Other Ambulatory Visit: Payer: Self-pay

## 2019-03-15 ENCOUNTER — Ambulatory Visit (INDEPENDENT_AMBULATORY_CARE_PROVIDER_SITE_OTHER): Payer: Medicare Other | Admitting: Family Medicine

## 2019-03-15 DIAGNOSIS — G500 Trigeminal neuralgia: Secondary | ICD-10-CM | POA: Diagnosis not present

## 2019-03-15 MED ORDER — GABAPENTIN 100 MG PO CAPS: 200.0000 mg | ORAL_CAPSULE | Freq: Three times a day (TID) | ORAL | 1 refills | Status: DC

## 2019-03-15 NOTE — Assessment & Plan Note (Signed)
Diagnosis given after visit to UC for mod to severe R facial pain  Interestingly this happened a day after trauma /hair pulling in that area  UC note reviewed  CT scan reviewed -reassuring Pt has had some initial improvement with gabapentin but still uncomfortable Will plan to titrate up - first to 200 mg tid -then if well tolerated inc the pm dose to 300 mg  Disc poss side eff such as sedation and dizziness- as she gets used to it  Will watch for rash or scalp change inst to update Monday re: symptoms If suddenly worse or change before then inst to go to ER

## 2019-03-15 NOTE — Progress Notes (Signed)
Virtual Visit via Video Note  I connected with [name] [name] on 03/15/19 at 10:30 AM EST by a video enabled telemedicine application and verified that I am speaking with the correct person using two identifiers.  Location: Patient: home Provider: office   I discussed the limitations of evaluation and management by telemedicine and the availability of in person appointments. The patient expressed understanding and agreed to proceed.  Parties involved in encounter  [place]  Provider:  Loura Pardon MD    History of Present Illness: Pt is here for f/u of visit to UC in [facility] She presented for sudden r sided ear pain and headache and ST   She was diagnosed with trigeminal neuralgia on R side of face CT of head showed no acute changes   They px gabapentin 100 mg tid   covid and strep tests were negative  She has had the shingrix series  CT HEAD WO CONTRAST  Result Date: 03/13/2019 CLINICAL DATA:  Trigeminal neuralgia.  Right-sided headache. EXAM: CT HEAD WITHOUT CONTRAST TECHNIQUE: Contiguous axial images were obtained from the base of the skull through the vertex without intravenous contrast. COMPARISON:  CT head 04/25/2007 FINDINGS: Brain: Chronic right MCA infarct unchanged involving the right insula and operculum and superior temporal lobe. No acute infarct.  Negative for hemorrhage or mass. Basilar cisterns normal.  Trigeminal nerve not well evaluated by CT. Vascular: Negative for hyperdense vessel Skull: Negative Sinuses/Orbits: Negative Other: None IMPRESSION: No acute abnormality. Chronic right MCA infarct unchanged from 2009. Electronically Signed   By: Franchot Gallo M.D.   On: 03/13/2019 16:05    Today she has a headache all over  Shooting pain over right ear at first  The more severe pain did calm down with gabapentin She was better yesterday than today   Had pain in the middle of the night  Also took tylenol   Some tenderness of the scalp on the right    She does note that she had a curling iron stuck in her hair above the R ear  Had to pull her hair very hard  Did not get burned    Patient Active Problem List   Diagnosis Date Noted  . Trigeminal neuralgia 03/15/2019  . COVID-19 01/21/2019  . Mobility impaired 12/11/2018  . Poor balance 08/13/2018  . Fall in home 08/13/2018  . Memory loss 06/27/2018  . Physical deconditioning 03/19/2018  . Chronic diastolic heart failure ([place]) 02/09/2018  . Prediabetes 01/26/2017  . Hearing loss 01/19/2016  . Carpal tunnel syndrome 08/31/2015  . Encounter for Medicare annual wellness exam 06/04/2014  . Obesity 06/04/2014  . Estrogen deficiency 06/04/2014  . Colon cancer screening 06/04/2014  . Schatzki's ring 03/27/2014  . OA (osteoarthritis) of knee 08/16/2011  . Hemorrhoids 08/10/2011  . Other dyspnea and respiratory abnormality 06/21/2010  . Urinary incontinence 05/27/2010  . OVERACTIVE BLADDER 03/12/2010  . Lumbar degenerative disc disease 04/13/2009  . GERD 10/15/2008  . CEREBROVASCULAR ACCIDENT, HX OF 10/15/2008  . ARTHRITIS, GENERALIZED 06/26/2008  . EXTERNAL HEMORRHOIDS 02/01/2008  . HIATAL HERNIA 02/01/2008  . DIVERTICULOSIS OF COLON 02/01/2008  . GASTRITIS, HX OF 02/01/2008  . DYSPNEA 09/19/2007  . Vitamin D deficiency 07/03/2007  . Osteoporosis of lumbar spine 06/13/2007  . Fatigue 06/13/2007  . HELICOBACTER PYLORI INFECTION 06/06/2006  . Hyperlipidemia 06/06/2006  . Generalized anxiety disorder 06/06/2006  . Adjustment disorder with mixed anxiety and depressed mood 06/06/2006  . MACULAR DEGENERATION 06/06/2006  . H/O: CVA (cerebrovascular accident) 06/06/2006  .  OSTEOARTHRITIS 06/06/2006   Past Medical History:  Diagnosis Date  . Alternating constipation and diarrhea   . Anxiety state, unspecified   . Complication of anesthesia    slow to wake up with surgery several years ago   . Depressive disorder, not elsewhere classified   . Diaphragmatic hernia without  mention of obstruction or gangrene   . Difficulty sleeping   . Diverticulosis of colon (without mention of hemorrhage)   . Esophageal reflux   . External hemorrhoids without mention of complication   . Fatigue   . Frequency of urination   . Helicobacter pylori (H. pylori)   . Hiatal hernia   . History of transfusion   . Lumbar spondylosis   . Macular degeneration (senile) of retina, unspecified   . Osteoarthrosis, unspecified whether generalized or localized, unspecified site 08-11-11   hx. "rhematoid arthritis", osteoarthritis, DDD, bursitis(hip)  . Osteoporosis   . Palpitations   . PONV (postoperative nausea and vomiting)   . Schatzki's ring   . Shortness of breath 08-11-11   with exertion only at present  . Unspecified cerebral artery occlusion with cerebral infarction 08-11-11   '97/ '06( TIA)-affected lt. side, slight weakness L side  . Unspecified vitamin D deficiency    Past Surgical History:  Procedure Laterality Date  . ABDOMINAL HYSTERECTOMY  08-11-11  . APPENDECTOMY    . BACK SURGERY  08-11-11   '11-hx. lumbar fusion with retained hardware  . CATARACT EXTRACTION  08-11-11   Bilateral  . ESOPHAGEAL DILATION    . KNEE SURGERY  08-11-11   rt. knee scope  . PATELLAR TENDON REPAIR Left 02/22/2014   Procedure: PATELLA TENDON REPAIR;  Surgeon: Gearlean Alf, MD;  Location: [facility];  Service: Orthopedics;  Laterality: Left;  . PATELLAR TENDON REPAIR Left 06/25/2014   Procedure: LEFT PATELLA TENDON REPAIR;  Surgeon: Gaynelle Arabian, MD;  Location: [facility];  Service: Orthopedics;  Laterality: Left;  . TOTAL KNEE ARTHROPLASTY  08/16/2011   Procedure: TOTAL KNEE ARTHROPLASTY;  Surgeon: Gearlean Alf, MD;  Location: [facility];  Service: Orthopedics;  Laterality: Right;  . TOTAL KNEE ARTHROPLASTY Left 01/27/2014   Procedure: LEFT TOTAL KNEE ARTHROPLASTY;  Surgeon: Gearlean Alf, MD;  Location: [facility];  Service: Orthopedics;  Laterality: Left;  . TUBAL LIGATION     Social History    Tobacco Use  . Smoking status: Never Smoker  . Smokeless tobacco: Never Used  Substance Use Topics  . Alcohol use: No    Alcohol/week: 0.0 standard drinks  . Drug use: No   Family History  Problem Relation Age of Onset  . Heart disease Father   . Coronary artery disease Brother   . Diabetes Brother   . Heart disease Brother   . Pancreatic cancer Daughter   . Diabetes Sister   . Colon cancer Neg Hx   . Colon polyps Neg Hx   . Esophageal cancer Neg Hx   . Gallbladder disease Neg Hx    Allergies  Allergen Reactions  . Alendronate Sodium     REACTION: JAW PAIN  . Alendronate Sodium Other (See Comments)    REACTION: JAW PAIN  . Celecoxib Other (See Comments)    REACTION: GI UPSET REACTION: GI UPSET  . Nexium [Esomeprazole Magnesium] Nausea And Vomiting    Pt cannot take the generic Does fine with DAW Pt cannot take the generic Does fine with DAW  . Omeprazole Nausea Only  . Protonix [Pantoprazole Sodium] Nausea And Vomiting  .  Rabeprazole Nausea And Vomiting  . Latex Itching and Rash   Current Outpatient Medications on File Prior to Visit  Medication Sig Dispense Refill  . acetaminophen (TYLENOL) 325 MG tablet Take 2 tablets (650 mg total) by mouth every 6 (six) hours as needed for mild pain (or Fever >/= 101). 40 tablet 0  . ALPRAZolam (XANAX) 0.5 MG tablet TAKE 1 TABLET AT BEDTIME AS NEEDED FOR SLEEP 90 tablet 0  . atorvastatin (LIPITOR) 10 MG tablet Take 1 tablet (10 mg total) by mouth daily. 90 tablet 3  . calcium carbonate (TUMS - DOSED IN MG ELEMENTAL CALCIUM) 500 MG chewable tablet Chew 1 tablet (200 mg of elemental calcium total) by mouth 4 (four) times daily as needed for indigestion or heartburn. 10 tablet 0  . clopidogrel (PLAVIX) 75 MG tablet TAKE 1 TABLET DAILY 90 tablet 1  . diclofenac sodium (VOLTAREN) 1 % GEL Apply 1 application topically 2 (two) times daily.    Marland Kitchen esomeprazole (NEXIUM) 40 MG capsule Take 1 capsule (40 mg total) by mouth 2 (two) times  daily before a meal. 180 capsule 3  . fluticasone (FLONASE) 50 MCG/ACT nasal spray Place 1-2 sprays into both nostrils daily as needed for allergies or rhinitis.    . furosemide (LASIX) 20 MG tablet Take 1 tablet (20 mg total) by mouth daily. 90 tablet 3  . HYDROcodone-acetaminophen (NORCO) 10-325 MG tablet Take 1 tablet by mouth every 6 (six) hours as needed.   0  . metoprolol succinate (TOPROL XL) 25 MG 24 hr tablet TAKE ONE-HALF (1/2) TABLET AT BEDTIME 45 tablet 3  . Multiple Vitamins-Minerals (PRESERVISION AREDS PO) Take 1 capsule by mouth 2 (two) times daily.    Vladimir Faster Glycol-Propyl Glycol (SYSTANE OP) Apply 1-2 drops to eye 3 (three) times daily as needed (dry eyes.).     Marland Kitchen promethazine (PHENERGAN) 6.25 MG/5ML syrup Take 5 mLs (6.25 mg total) by mouth every 6 (six) hours as needed (cough). 120 mL 0  . sertraline (ZOLOFT) 100 MG tablet Take 2 tablets (200 mg total) by mouth daily. (Patient taking differently: Take 100 mg by mouth 2 (two) times daily. ) 180 tablet 3   No current facility-administered medications on file prior to visit.   Review of Systems  Constitutional: Negative for chills, fever and malaise/fatigue.  HENT: Positive for sore throat and tinnitus. Negative for congestion, ear pain and sinus pain.        R facial pain including ear and throat/neck  Eyes: Negative for blurred vision, discharge and redness.  Respiratory: Negative for cough, shortness of breath and stridor.   Cardiovascular: Negative for chest pain, palpitations and leg swelling.  Gastrointestinal: Negative for abdominal pain, diarrhea, nausea and vomiting.  Musculoskeletal: Negative for myalgias.  Skin: Negative for rash.  Neurological: Positive for tingling and headaches. Negative for dizziness, tremors, focal weakness, seizures and loss of consciousness.  Psychiatric/Behavioral: Negative for depression and hallucinations.    Observations/Objective: Patient appears well, in no distress Weight is  baseline (obese)  No facial swelling or asymmetry Pt notes some tenderness when palpating area above and in front of ear  Normal voice-not hoarse and no slurred speech No obvious tremor or mobility impairment Moving neck and UEs normally Able to hear the call fairly well  No cough or shortness of breath during interview  Talkative and mentally sharp with no cognitive changes No skin changes on face or neck , no rash or pallor Affect is normal    Assessment and Plan:  Problem List Items Addressed This Visit      Nervous and Auditory   Trigeminal neuralgia    Diagnosis given after visit to UC for mod to severe R facial pain  Interestingly this happened a day after trauma /hair pulling in that area  UC note reviewed  CT scan reviewed -reassuring Pt has had some initial improvement with gabapentin but still uncomfortable Will plan to titrate up - first to 200 mg tid -then if well tolerated inc the pm dose to 300 mg  Disc poss side eff such as sedation and dizziness- as she gets used to it  Will watch for rash or scalp change inst to update Monday re: symptoms If suddenly worse or change before then inst to go to ER       Relevant Medications   gabapentin (NEURONTIN) 100 MG capsule       Follow Up Instructions: Take 200 mg of gabapentin every 8 hours  If you have any intolerable side effects let us know  Be careful of sedation or dizziness   If you tolerate it well by Sunday you can try 300 mg for your last dose of the day   Let us know how you are doing Monday If suddenly worse-call and go to the ER   I discussed the assessment and treatment plan with the patient. The patient was provided an opportunity to ask questions and all were answered. The patient agreed with the plan and demonstrated an understanding of the instructions.   The patient was advised to call back or seek an in-person evaluation if the symptoms worsen or if the condition fails to improve as  anticipated.     Loura Pardon, MD

## 2019-03-15 NOTE — Patient Instructions (Addendum)
Take 200 mg of gabapentin every 8 hours  If you have any intolerable side effects let us know  Be careful of sedation or dizziness   If you tolerate it well by Sunday you can try 300 mg for your last dose of the day   Let us know how you are doing Monday If suddenly worse-call and go to the ER

## 2019-04-03 ENCOUNTER — Other Ambulatory Visit: Payer: Self-pay | Admitting: Family Medicine

## 2019-04-03 NOTE — Telephone Encounter (Signed)
Name of Medication:xanax Name of [place] or Written Date and Quantity:11/20/18 #90 tabs with 0 refill Last Office Visit and Type:03/15/19 f/u from mychart message Next Office Visit and Type:CPE/AWV 04/29/19 Last Controlled Substance Agreement Date:05/07/2013 Last UDS:05/07/2013

## 2019-04-04 ENCOUNTER — Other Ambulatory Visit: Payer: Self-pay | Admitting: *Deleted

## 2019-04-04 MED ORDER — ESOMEPRAZOLE MAGNESIUM 40 MG PO CPDR: 40.0000 mg | DELAYED_RELEASE_CAPSULE | Freq: Two times a day (BID) | ORAL | 0 refills | Status: DC

## 2019-04-05 ENCOUNTER — Telehealth: Payer: Self-pay

## 2019-04-05 NOTE — Telephone Encounter (Signed)
Please clarify with her - if she has been able to tolerate the generic lately it is fine and we can take that off the med reaction list  Thanks

## 2019-04-05 NOTE — Telephone Encounter (Signed)
Received a fax from [place] wanting clarification on [facility]ix Nexium RX that was sent in on 04/04/19. Patient has listed in her allergies that she is allergic to the generic form of Nexium but on the RX it says to feel generic brand. Please review

## 2019-04-05 NOTE — Telephone Encounter (Signed)
Patient states that she got use to generic brand and will continue using it generic at this time. Patient said to go ahead and take it off her intolerance list. I faxed [facility] back with answer to this.

## 2019-04-18 DIAGNOSIS — H353132 Nonexudative age-related macular degeneration, bilateral, intermediate dry stage: Secondary | ICD-10-CM | POA: Diagnosis not present

## 2019-04-26 DIAGNOSIS — M65331 Trigger finger, right middle finger: Secondary | ICD-10-CM | POA: Diagnosis not present

## 2019-04-26 DIAGNOSIS — G5601 Carpal tunnel syndrome, right upper limb: Secondary | ICD-10-CM | POA: Diagnosis not present

## 2019-04-26 DIAGNOSIS — M19041 Primary osteoarthritis, right hand: Secondary | ICD-10-CM | POA: Diagnosis not present

## 2019-04-26 DIAGNOSIS — M65332 Trigger finger, left middle finger: Secondary | ICD-10-CM | POA: Diagnosis not present

## 2019-04-26 DIAGNOSIS — M180 Bilateral primary osteoarthritis of first carpometacarpal joints: Secondary | ICD-10-CM | POA: Diagnosis not present

## 2019-04-29 ENCOUNTER — Ambulatory Visit (INDEPENDENT_AMBULATORY_CARE_PROVIDER_SITE_OTHER): Payer: Medicare Other | Admitting: Family Medicine

## 2019-04-29 ENCOUNTER — Encounter: Payer: Self-pay | Admitting: Family Medicine

## 2019-04-29 ENCOUNTER — Other Ambulatory Visit: Payer: Self-pay

## 2019-04-29 VITALS — BP 124/72 | HR 60 | Temp 97.5°F | Ht 63.5 in | Wt 192.5 lb

## 2019-04-29 DIAGNOSIS — Z6833 Body mass index (BMI) 33.0-33.9, adult: Secondary | ICD-10-CM

## 2019-04-29 DIAGNOSIS — K219 Gastro-esophageal reflux disease without esophagitis: Secondary | ICD-10-CM | POA: Diagnosis not present

## 2019-04-29 DIAGNOSIS — M810 Age-related osteoporosis without current pathological fracture: Secondary | ICD-10-CM | POA: Diagnosis not present

## 2019-04-29 DIAGNOSIS — E559 Vitamin D deficiency, unspecified: Secondary | ICD-10-CM | POA: Diagnosis not present

## 2019-04-29 DIAGNOSIS — F4323 Adjustment disorder with mixed anxiety and depressed mood: Secondary | ICD-10-CM

## 2019-04-29 DIAGNOSIS — E2839 Other primary ovarian failure: Secondary | ICD-10-CM

## 2019-04-29 DIAGNOSIS — R5382 Chronic fatigue, unspecified: Secondary | ICD-10-CM

## 2019-04-29 DIAGNOSIS — Z1211 Encounter for screening for malignant neoplasm of colon: Secondary | ICD-10-CM

## 2019-04-29 DIAGNOSIS — E6609 Other obesity due to excess calories: Secondary | ICD-10-CM | POA: Diagnosis not present

## 2019-04-29 DIAGNOSIS — R7303 Prediabetes: Secondary | ICD-10-CM

## 2019-04-29 DIAGNOSIS — G500 Trigeminal neuralgia: Secondary | ICD-10-CM | POA: Diagnosis not present

## 2019-04-29 DIAGNOSIS — E7800 Pure hypercholesterolemia, unspecified: Secondary | ICD-10-CM

## 2019-04-29 DIAGNOSIS — Z0000 Encounter for general adult medical examination without abnormal findings: Secondary | ICD-10-CM

## 2019-04-29 LAB — CBC WITH DIFFERENTIAL/PLATELET
Basophils Absolute: 0.1 10*3/uL (ref 0.0–0.1)
Basophils Relative: 1.2 % (ref 0.0–3.0)
Eosinophils Absolute: 0.1 10*3/uL (ref 0.0–0.7)
Eosinophils Relative: 2.5 % (ref 0.0–5.0)
HCT: 42.1 % (ref 36.0–46.0)
Hemoglobin: 13.9 g/dL (ref 12.0–15.0)
Lymphocytes Relative: 29.2 % (ref 12.0–46.0)
Lymphs Abs: 1.6 10*3/uL (ref 0.7–4.0)
MCHC: 33 g/dL (ref 30.0–36.0)
MCV: 88.7 fl (ref 78.0–100.0)
Monocytes Absolute: 0.5 10*3/uL (ref 0.1–1.0)
Monocytes Relative: 9.3 % (ref 3.0–12.0)
Neutro Abs: 3.1 10*3/uL (ref 1.4–7.7)
Neutrophils Relative %: 57.8 % (ref 43.0–77.0)
Platelets: 245 10*3/uL (ref 150.0–400.0)
RBC: 4.74 Mil/uL (ref 3.87–5.11)
RDW: 14 % (ref 11.5–15.5)
WBC: 5.5 10*3/uL (ref 4.0–10.5)

## 2019-04-29 LAB — LIPID PANEL
Cholesterol: 167 mg/dL (ref 0–200)
HDL: 69.9 mg/dL (ref >39.00)
LDL Cholesterol: 78 mg/dL (ref 0–99)
NonHDL: 97.28
Total CHOL/HDL Ratio: 2
Triglycerides: 96 mg/dL (ref 0.0–149.0)
VLDL: 19.2 mg/dL (ref 0.0–40.0)

## 2019-04-29 LAB — COMPREHENSIVE METABOLIC PANEL
ALT: 14 U/L (ref 0–35)
AST: 19 U/L (ref 0–37)
Albumin: 4.5 g/dL (ref 3.5–5.2)
Alkaline Phosphatase: 81 U/L (ref 39–117)
BUN: 18 mg/dL (ref 6–23)
CO2: 28 mEq/L (ref 19–32)
Calcium: 9.5 mg/dL (ref 8.4–10.5)
Chloride: 102 mEq/L (ref 96–112)
Creatinine, Ser: 0.85 mg/dL (ref 0.40–1.20)
GFR: 63.99 mL/min (ref >60.00)
Glucose, Bld: 99 mg/dL (ref 70–99)
Potassium: 4.1 mEq/L (ref 3.5–5.1)
Sodium: 140 mEq/L (ref 135–145)
Total Bilirubin: 0.6 mg/dL (ref 0.2–1.2)
Total Protein: 7.4 g/dL (ref 6.0–8.3)

## 2019-04-29 LAB — VITAMIN D 25 HYDROXY (VIT D DEFICIENCY, FRACTURES): VITD: 50.92 ng/mL (ref 30.00–100.00)

## 2019-04-29 LAB — HEMOGLOBIN A1C: Hgb A1c MFr Bld: 5.8 % (ref 4.6–6.5)

## 2019-04-29 LAB — TSH: TSH: 2.59 u[IU]/mL (ref 0.35–4.50)

## 2019-04-29 MED ORDER — ATORVASTATIN CALCIUM 10 MG PO TABS: 10.0000 mg | ORAL_TABLET | Freq: Every day | ORAL | 3 refills | Status: DC

## 2019-04-29 MED ORDER — CLOPIDOGREL BISULFATE 75 MG PO TABS: 75.0000 mg | ORAL_TABLET | Freq: Every day | ORAL | 3 refills | Status: DC

## 2019-04-29 MED ORDER — SERTRALINE HCL 100 MG PO TABS: 200.0000 mg | ORAL_TABLET | Freq: Every day | ORAL | 3 refills | Status: DC

## 2019-04-29 NOTE — Assessment & Plan Note (Signed)
Refilled generic nexium

## 2019-04-29 NOTE — Assessment & Plan Note (Signed)
Reviewed health habits including diet and exercise and skin cancer prevention Reviewed appropriate screening tests for age  Also reviewed health mt list, fam hx and immunization status , as well as social and family history   Labs ordered  cologaurd kit ordered  Pt is considering covid vaccination now over 90 days from illness dexa ordered for f/u  Advance directive is done Cognitive status is fairly stable  Hearing screen abn-pt states not ready for aides  Vision exam is utd Mobility issues frustrate

## 2019-04-29 NOTE — Assessment & Plan Note (Signed)
A1C today  Per pt-eating worse and expect this to go up  disc imp of low glycemic diet and wt loss to prevent DM2

## 2019-04-29 NOTE — Assessment & Plan Note (Signed)
Labs drawn today  Disc goals for lipids and reasons to control them Rev last labs with pt Rev low sat fat diet in detail  inst to continue atorvastatin and work on diet

## 2019-04-29 NOTE — Patient Instructions (Addendum)
Consider getting the covid vaccine   Please make sure you schedule your bone density test at the [facility] office   We will get you signed up for the cologuard screening program  Labs today   I will work on the paperwork from veteren's admin

## 2019-04-29 NOTE — Assessment & Plan Note (Signed)
Discussed how this problem influences overall health and the risks it imposes  Reviewed plan for weight loss with lower calorie diet (via better food choices and also portion control or program like weight watchers) and exercise building up to or more than 30 minutes 5 days per week including some aerobic activity    

## 2019-04-29 NOTE — Assessment & Plan Note (Signed)
Still voices frustrations re: needing help with some ADLs (has to live with son) Reviewed stressors/ coping techniques/symptoms/ support sources/ tx options and side effects in detail today Continues sertraline  Enc outdoor time and activity as tolerated

## 2019-04-29 NOTE — Progress Notes (Signed)
Subjective:    Patient ID: [name] [name], female    DOB: 18-Nov-1934, 85 y.o.   MRN: [id number]  This visit occurred during the SARS-CoV-2 public health emergency.  Safety protocols were in place, including screening questions prior to the visit, additional usage of staff PPE, and extensive cleaning of exam room while observing appropriate contact time as indicated for disinfecting solutions.    HPI Pt presents for amw and annual f/u of chronic medical problems   I have personally reviewed the Medicare Annual Wellness questionnaire and have noted 1. The patient's medical and social history 2. Their use of alcohol, tobacco or illicit drugs 3. Their current medications and supplements 4. The patient's functional ability including ADL's, fall risks, home safety risks and hearing or visual             impairment. 5. Diet and physical activities 6. Evidence for depression or mood disorders  The patients weight, height, BMI have been recorded in the chart and visual acuity is per eye clinic.  I have made referrals, counseling and provided education to the patient based review of the above and I have provided the pt with a written personalized care plan for preventive services. Reviewed and updated provider list, see scanned forms.  See scanned forms.  Routine anticipatory guidance given to patient.  See health maintenance. Colon cancer screening-had colonoscopy 2010 cologuard 1/18 (wants to do it)  Breast cancer screening mammogram 7/20 Self breast exam-no lumps  Flu vaccine 9/20 covid vaccine- considering-had covid over 90 days ago  Tetanus vaccine 5/09 (postponed due to lack of ins coverage) Pneumovax-completed Zoster vaccine-had the shingrix series  Dexa 2/19 -osteopenia (stable)  Falls-none  Fractures-none  Supplements-taking her vit D and ca  Exercise -walking (is limited)  Advance directive- has it done  Cognitive function addressed- see scanned forms- and if abnormal  then additional documentation follows.  Short term memory slowly worsens with age but still able to do what she wants to    [facility] and [facility] reviewed  Meds, vitals, and allergies reviewed.   ROS: See HPI.  Otherwise negative.    Weight : Wt Readings from Last 3 Encounters:  04/29/19 192 lb 8 oz (87.3 kg)  02/06/19 191 lb (86.6 kg)  12/11/18 195 lb 1 oz (88.5 kg)  stable  33.56 kg/m   Hearing/vision:  Hearing Screening   125Hz  250Hz  500Hz  1000Hz  2000Hz  3000Hz  4000Hz  6000Hz  8000Hz   Right ear:   0 40 0  40    Left ear:   0 0 40  40    Vision Screening Comments: Pt had yearly eye exam with Dr. Bing Plume does not have hearing aides (family wants her to) -she does not want them yet   BP Readings from Last 3 Encounters:  04/29/19 124/72  03/15/19 134/66  02/06/19 122/62   Pulse Readings from Last 3 Encounters:  04/29/19 60  03/15/19 60  02/06/19 78    H/o CHF that has been quiet    Mood -it is frustrating having to give up independence with age  Overall depression/anx are stable  Needs to focus on the positive   Prediabetes Lab Results  Component Value Date   HGBA1C 5.9 02/09/2018  needs labs  She thinks it will look worse-since moving her diet routine is worse -son brings in fast foods     Hyperlipidemia H/o CVA Lab Results  Component Value Date   CHOL 187 02/09/2018   HDL 72.40 02/09/2018   [place] 91 02/09/2018  TRIG 115.0 02/09/2018   CHOLHDL 3 02/09/2018  takes atorvastatin  Also plavix   Due for labs   Had covid 12/20  Still has headaches and trigeminal neuralgia still  Takes gabapentin  Tips of fingers stay numb (has carpal tunnel getting set up with nerve study with Dr Fredna Dow )   Also trigger finger   Needs forms filled out to get help from the [facility] admin  Patient Active Problem List   Diagnosis Date Noted  . Trigeminal neuralgia 03/15/2019  . Mobility impaired 12/11/2018  . Poor balance 08/13/2018  . Fall in home 08/13/2018  . Memory  loss 06/27/2018  . Physical deconditioning 03/19/2018  . Chronic diastolic heart failure ([place]) 02/09/2018  . Prediabetes 01/26/2017  . Hearing loss 01/19/2016  . Carpal tunnel syndrome 08/31/2015  . Encounter for Medicare annual wellness exam 06/04/2014  . Obesity 06/04/2014  . Estrogen deficiency 06/04/2014  . Colon cancer screening 06/04/2014  . Schatzki's ring 03/27/2014  . OA (osteoarthritis) of knee 08/16/2011  . Hemorrhoids 08/10/2011  . Other dyspnea and respiratory abnormality 06/21/2010  . Urinary incontinence 05/27/2010  . OVERACTIVE BLADDER 03/12/2010  . Lumbar degenerative disc disease 04/13/2009  . GERD 10/15/2008  . CEREBROVASCULAR ACCIDENT, HX OF 10/15/2008  . ARTHRITIS, GENERALIZED 06/26/2008  . EXTERNAL HEMORRHOIDS 02/01/2008  . HIATAL HERNIA 02/01/2008  . DIVERTICULOSIS OF COLON 02/01/2008  . GASTRITIS, HX OF 02/01/2008  . DYSPNEA 09/19/2007  . Vitamin D deficiency 07/03/2007  . Osteoporosis of lumbar spine 06/13/2007  . Fatigue 06/13/2007  . HELICOBACTER PYLORI INFECTION 06/06/2006  . Hyperlipidemia 06/06/2006  . Generalized anxiety disorder 06/06/2006  . Adjustment disorder with mixed anxiety and depressed mood 06/06/2006  . MACULAR DEGENERATION 06/06/2006  . H/O: CVA (cerebrovascular accident) 06/06/2006  . OSTEOARTHRITIS 06/06/2006   Past Medical History:  Diagnosis Date  . Alternating constipation and diarrhea   . Anxiety state, unspecified   . Complication of anesthesia    slow to wake up with surgery several years ago   . Depressive disorder, not elsewhere classified   . Diaphragmatic hernia without mention of obstruction or gangrene   . Difficulty sleeping   . Diverticulosis of colon (without mention of hemorrhage)   . Esophageal reflux   . External hemorrhoids without mention of complication   . Fatigue   . Frequency of urination   . Helicobacter pylori (H. pylori)   . Hiatal hernia   . History of transfusion   . Lumbar spondylosis   .  Macular degeneration (senile) of retina, unspecified   . Osteoarthrosis, unspecified whether generalized or localized, unspecified site 08-11-11   hx. "rhematoid arthritis", osteoarthritis, DDD, bursitis(hip)  . Osteoporosis   . Palpitations   . PONV (postoperative nausea and vomiting)   . Schatzki's ring   . Shortness of breath 08-11-11   with exertion only at present  . Unspecified cerebral artery occlusion with cerebral infarction 08-11-11   '97/ '06( TIA)-affected lt. side, slight weakness L side  . Unspecified vitamin D deficiency    Past Surgical History:  Procedure Laterality Date  . ABDOMINAL HYSTERECTOMY  08-11-11  . APPENDECTOMY    . BACK SURGERY  08-11-11   '11-hx. lumbar fusion with retained hardware  . CATARACT EXTRACTION  08-11-11   Bilateral  . ESOPHAGEAL DILATION    . KNEE SURGERY  08-11-11   rt. knee scope  . PATELLAR TENDON REPAIR Left 02/22/2014   Procedure: PATELLA TENDON REPAIR;  Surgeon: Gearlean Alf, MD;  Location: [facility];  Service:  Orthopedics;  Laterality: Left;  . PATELLAR TENDON REPAIR Left 06/25/2014   Procedure: LEFT PATELLA TENDON REPAIR;  Surgeon: Gaynelle Arabian, MD;  Location: [facility];  Service: Orthopedics;  Laterality: Left;  . TOTAL KNEE ARTHROPLASTY  08/16/2011   Procedure: TOTAL KNEE ARTHROPLASTY;  Surgeon: Gearlean Alf, MD;  Location: [facility];  Service: Orthopedics;  Laterality: Right;  . TOTAL KNEE ARTHROPLASTY Left 01/27/2014   Procedure: LEFT TOTAL KNEE ARTHROPLASTY;  Surgeon: Gearlean Alf, MD;  Location: [facility];  Service: Orthopedics;  Laterality: Left;  . TUBAL LIGATION     Social History   Tobacco Use  . Smoking status: Never Smoker  . Smokeless tobacco: Never Used  Substance Use Topics  . Alcohol use: No    Alcohol/week: 0.0 standard drinks  . Drug use: No   Family History  Problem Relation Age of Onset  . Heart disease Father   . Coronary artery disease Brother   . Diabetes Brother   . Heart disease Brother   . Pancreatic cancer  Daughter   . Diabetes Sister   . Colon cancer Neg Hx   . Colon polyps Neg Hx   . Esophageal cancer Neg Hx   . Gallbladder disease Neg Hx    Allergies  Allergen Reactions  . Alendronate Sodium     REACTION: JAW PAIN  . Alendronate Sodium Other (See Comments)    REACTION: JAW PAIN  . Celecoxib Other (See Comments)    REACTION: GI UPSET REACTION: GI UPSET  . Omeprazole Nausea Only  . Protonix [Pantoprazole Sodium] Nausea And Vomiting  . Rabeprazole Nausea And Vomiting  . Latex Itching and Rash   Current Outpatient Medications on File Prior to Visit  Medication Sig Dispense Refill  . acetaminophen (TYLENOL) 325 MG tablet Take 2 tablets (650 mg total) by mouth every 6 (six) hours as needed for mild pain (or Fever >/= 101). 40 tablet 0  . ALPRAZolam (XANAX) 0.5 MG tablet TAKE 1 TABLET AT BEDTIME AS NEEDED FOR SLEEP 90 tablet 0  . calcium carbonate (TUMS - DOSED IN MG ELEMENTAL CALCIUM) 500 MG chewable tablet Chew 1 tablet (200 mg of elemental calcium total) by mouth 4 (four) times daily as needed for indigestion or heartburn. 10 tablet 0  . CALCIUM-MAGNESIUM-ZINC PO Take 1 capsule by mouth daily.    . diclofenac sodium (VOLTAREN) 1 % GEL Apply 1 application topically 2 (two) times daily.    Marland Kitchen esomeprazole (NEXIUM) 40 MG capsule Take 1 capsule (40 mg total) by mouth 2 (two) times daily before a meal. 180 capsule 0  . fluticasone (FLONASE) 50 MCG/ACT nasal spray Place 1-2 sprays into both nostrils daily as needed for allergies or rhinitis.    . furosemide (LASIX) 20 MG tablet Take 1 tablet (20 mg total) by mouth daily. 90 tablet 3  . gabapentin (NEURONTIN) 100 MG capsule Take 2 capsules (200 mg total) by mouth 3 (three) times daily. (Patient taking differently: Take 100 mg by mouth 3 (three) times daily. ) 180 capsule 1  . HYDROcodone-acetaminophen (NORCO) 10-325 MG tablet Take 1 tablet by mouth every 6 (six) hours as needed.   0  . metoprolol succinate (TOPROL XL) 25 MG 24 hr tablet TAKE  ONE-HALF (1/2) TABLET AT BEDTIME 45 tablet 3  . Multiple Vitamins-Minerals (PRESERVISION AREDS PO) Take 1 capsule by mouth 2 (two) times daily.    Vladimir Faster Glycol-Propyl Glycol (SYSTANE OP) Apply 1-2 drops to eye 3 (three) times daily as needed (dry eyes.).     Marland Kitchen  promethazine (PHENERGAN) 6.25 MG/5ML syrup Take 5 mLs (6.25 mg total) by mouth every 6 (six) hours as needed (cough). 120 mL 0   No current facility-administered medications on file prior to visit.     Review of Systems  Constitutional: Positive for fatigue. Negative for activity change, appetite change, fever and unexpected weight change.  HENT: Negative for congestion, ear pain, rhinorrhea, sinus pressure, sore throat and voice change.   Eyes: Positive for visual disturbance. Negative for pain and redness.       Macular degeneration  Respiratory: Negative for cough, shortness of breath and wheezing.        Sob with exertion   Cardiovascular: Negative for chest pain and palpitations.  Gastrointestinal: Negative for abdominal pain, blood in stool, constipation and diarrhea.  Endocrine: Negative for polydipsia and polyuria.  Genitourinary: Negative for dysuria, frequency and urgency.       Frequency/urge incontinence  Musculoskeletal: Positive for arthralgias, back pain, gait problem, myalgias and neck pain. Negative for joint swelling.       Mobility impaired  Skin: Negative for pallor and rash.  Allergic/Immunologic: Negative for environmental allergies.  Neurological: Positive for weakness, numbness and headaches. Negative for dizziness and syncope.       Numbness in fingers - CTS  Hematological: Negative for adenopathy. Does not bruise/bleed easily.  Psychiatric/Behavioral: Positive for dysphoric mood. Negative for decreased concentration and suicidal ideas. The patient is nervous/anxious.        Objective:   Physical Exam Constitutional:      General: She is not in acute distress.    Appearance: Normal appearance.  She is well-developed. She is obese. She is not ill-appearing or diaphoretic.     Comments: Mobility impaired Examined in chair  HENT:     Head: Normocephalic and atraumatic.     Right Ear: Tympanic membrane, ear canal and external ear normal.     Left Ear: Tympanic membrane, ear canal and external ear normal.     Nose: Nose normal. No congestion.     Mouth/Throat:     Mouth: Mucous membranes are moist.     Pharynx: Oropharynx is clear. No posterior oropharyngeal erythema.  Eyes:     General: No scleral icterus.    Extraocular Movements: Extraocular movements intact.     Conjunctiva/sclera: Conjunctivae normal.     Pupils: Pupils are equal, round, and reactive to light.  Neck:     Thyroid: No thyromegaly.     Vascular: No carotid bruit or JVD.  Cardiovascular:     Rate and Rhythm: Normal rate and regular rhythm.     Pulses: Normal pulses.     Heart sounds: Normal heart sounds. No gallop.   Pulmonary:     Effort: Pulmonary effort is normal. No respiratory distress.     Breath sounds: Normal breath sounds. No stridor. No wheezing.     Comments: Good air exch Chest:     Chest wall: No tenderness.  Abdominal:     General: Bowel sounds are normal. There is no distension or abdominal bruit.     Palpations: Abdomen is soft. There is no mass.     Tenderness: There is no abdominal tenderness. There is no guarding or rebound.     Hernia: No hernia is present.  Genitourinary:    Comments: Breast exam: No mass, nodules, thickening, tenderness, bulging, retraction, inflamation, nipple discharge or skin changes noted.  No axillary or clavicular LA.     Musculoskeletal:  General: No tenderness. Normal range of motion.     Cervical back: Normal range of motion and neck supple. No rigidity. No muscular tenderness.     Right lower leg: No edema.     Left lower leg: No edema.     Comments: Mild kyphosis  Limited rom of hips/knees Walks with walker  Lymphadenopathy:     Cervical: No  cervical adenopathy.  Skin:    General: Skin is warm and dry.     Coloration: Skin is not pale.     Findings: No erythema or rash.     Comments: Solar lentigines diffusely Some sks  Neurological:     Mental Status: She is alert. Mental status is at baseline.     Cranial Nerves: No cranial nerve deficit.     Motor: No abnormal muscle tone.     Coordination: Coordination normal.     Gait: Gait normal.     Deep Tendon Reflexes: Reflexes are normal and symmetric. Reflexes normal.     Comments: Decreased sensation in fingertips  Psychiatric:        Mood and Affect: Mood is depressed.        Cognition and Memory: Cognition and memory normal.     Comments: Voices frustration  Mood is mildly depressed Pleasant affect           Assessment & Plan:   Problem List Items Addressed This Visit      Digestive   GERD    Refilled generic nexium         Nervous and Auditory   Trigeminal neuralgia    Still having symptoms/not as severe Pt was able to decrease her gabapentin      Relevant Medications   sertraline (ZOLOFT) 100 MG tablet     Musculoskeletal and Integument   Osteoporosis of lumbar spine    Due for 2 y dexa  No falls or fx Order done for [employer] office  She will call to schedule   Enc ca and D and activity as tolerated       Relevant Medications   CALCIUM-MAGNESIUM-ZINC PO     Other   Vitamin D deficiency    Level check today in setting of low bone mass Disc imp to bone and overall health      Relevant Orders   VITAMIN D 25 Hydroxy (Vit-D Deficiency, Fractures)   Hyperlipidemia    Labs drawn today  Disc goals for lipids and reasons to control them Rev last labs with pt Rev low sat fat diet in detail  inst to continue atorvastatin and work on diet      Relevant Medications   atorvastatin (LIPITOR) 10 MG tablet   Other Relevant Orders   Comprehensive metabolic panel   Lipid panel   Adjustment disorder with mixed anxiety and depressed mood     Still voices frustrations re: needing help with some ADLs (has to live with son) Reviewed stressors/ coping techniques/symptoms/ support sources/ tx options and side effects in detail today Continues sertraline  Enc outdoor time and activity as tolerated      Fatigue    Acute on chronic  Multifactorial with mobility impairment Labs done today      Relevant Orders   CBC with Differential/Platelet   Comprehensive metabolic panel   TSH   Encounter for Medicare annual wellness exam - Primary    Reviewed health habits including diet and exercise and skin cancer prevention Reviewed appropriate screening tests for age  Also reviewed  health mt list, fam hx and immunization status , as well as social and family history   Labs ordered  cologaurd kit ordered  Pt is considering covid vaccination now over 90 days from illness dexa ordered for f/u  Advance directive is done Cognitive status is fairly stable  Hearing screen abn-pt states not ready for aides  Vision exam is utd Mobility issues frustrate        Obesity    Discussed how this problem influences overall health and the risks it imposes  Reviewed plan for weight loss with lower calorie diet (via better food choices and also portion control or program like weight watchers) and exercise building up to or more than 30 minutes 5 days per week including some aerobic activity         Estrogen deficiency   Relevant Orders   DG Bone Density   Colon cancer screening    cologuard kit ordered She has done this before      Prediabetes    A1C today  Per pt-eating worse and expect this to go up  disc imp of low glycemic diet and wt loss to prevent DM2       Relevant Orders   Hemoglobin A1c

## 2019-04-29 NOTE — Assessment & Plan Note (Signed)
cologuard kit ordered She has done this before

## 2019-04-29 NOTE — Assessment & Plan Note (Signed)
Due for 4 y dexa  No falls or fx Order done for [employer] office  She will call to schedule   Enc ca and D and activity as tolerated

## 2019-04-29 NOTE — Assessment & Plan Note (Signed)
Still having symptoms/not as severe Pt was able to decrease her gabapentin

## 2019-04-29 NOTE — Assessment & Plan Note (Signed)
Acute on chronic  Multifactorial with mobility impairment Labs done today

## 2019-04-29 NOTE — Assessment & Plan Note (Signed)
Level check today in setting of low bone mass Disc imp to bone and overall health

## 2019-04-30 ENCOUNTER — Encounter: Payer: Self-pay | Admitting: *Deleted

## 2019-05-09 ENCOUNTER — Telehealth: Payer: Self-pay | Admitting: *Deleted

## 2019-05-09 NOTE — Telephone Encounter (Signed)
We received a [employer] form for pt saying she needs a Face-to-face for a mobility assessment to get her approved for a power wheelchair. It says on the form that the primary reason for the the visit must be listed as "Mobility assessment" and has to have a note documenting that she is referring pt for PT eval for wheelchair. Dr. Glori Bickers is aware that pt just had a CPE but that's the primary diagnosis so pt would need another appt to discuss the mobility assessment directly.   Called pt and no answer so left VM requesting pt to call the office  Called pt's son (on [place]) and advised him of this and that it would have to be an in office appt not a phone visit, I answered all his questions and he said he would get pt to call back and schedule appt so she can fit it around her schedule

## 2019-05-09 NOTE — Telephone Encounter (Signed)
FYI  Patient called back  Advised about message below from [place]. Patient understood and schedule appt 4/26 @ 12 for Mobility assessment

## 2019-05-13 ENCOUNTER — Ambulatory Visit (INDEPENDENT_AMBULATORY_CARE_PROVIDER_SITE_OTHER): Payer: Medicare Other | Admitting: Family Medicine

## 2019-05-13 ENCOUNTER — Encounter: Payer: Self-pay | Admitting: Family Medicine

## 2019-05-13 ENCOUNTER — Other Ambulatory Visit: Payer: Self-pay

## 2019-05-13 VITALS — BP 122/70 | HR 60 | Temp 97.6°F | Ht 63.5 in | Wt 193.1 lb

## 2019-05-13 DIAGNOSIS — Z7409 Other reduced mobility: Secondary | ICD-10-CM | POA: Diagnosis not present

## 2019-05-13 DIAGNOSIS — M5136 Other intervertebral disc degeneration, lumbar region: Secondary | ICD-10-CM | POA: Diagnosis not present

## 2019-05-13 DIAGNOSIS — Z6833 Body mass index (BMI) 33.0-33.9, adult: Secondary | ICD-10-CM | POA: Diagnosis not present

## 2019-05-13 DIAGNOSIS — E6609 Other obesity due to excess calories: Secondary | ICD-10-CM | POA: Diagnosis not present

## 2019-05-13 DIAGNOSIS — M129 Arthropathy, unspecified: Secondary | ICD-10-CM | POA: Diagnosis not present

## 2019-05-13 DIAGNOSIS — R2689 Other abnormalities of gait and mobility: Secondary | ICD-10-CM

## 2019-05-13 NOTE — Patient Instructions (Signed)
I will work on paperwork to assess mobility  A PT /OT evaluation for a motorized wheelchair would be very helpful

## 2019-05-13 NOTE — Assessment & Plan Note (Addendum)
Due to OA of knees/hands and spine Also deg disc dz in spine  Also poor balance related to age and old cva  Also from L sided weakness from cva   She needs assistance with ADLs and can ambulate only very short distances with walker (5 feet or less)  Can stand less than 30 seconds   A motorized wheelchair would help greatly with ability to perform ADLs and also to get out of house/shopping, etc   Ref made for PT/OT eval for wheelchair (filled out forms from [employer])

## 2019-05-13 NOTE — Assessment & Plan Note (Signed)
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

## 2019-05-13 NOTE — Progress Notes (Signed)
Subjective:    Patient ID: [name] [name], female    DOB: [birth year], 85 y.o.   MRN: [id number]  This visit occurred during the SARS-CoV-2 public health emergency.  Safety protocols were in place, including screening questions prior to the visit, additional usage of staff PPE, and extensive cleaning of exam room while observing appropriate contact time as indicated for disinfecting solutions.    HPI Pt presents for a mobility assessment to see if she can have  PT evaluation for a wheelchair   Wt Readings from Last 3 Encounters:  05/13/19 193 lb 2 oz (87.6 kg)  04/29/19 192 lb 8 oz (87.3 kg)  02/06/19 191 lb (86.6 kg)   33.67 kg/m   85 yo pt with complicated hx of arthritis and lumbar disc dz is having significant problems with mobility (affecting ADLs)  Has had multiple knee surgeries  Is right handed   She has poor balance as well (PT for that in the past)  Also L sided weakness from past CVA She still has trouble with grip in L hand - dropping cups and forks  More recently has tingling in R hand (? CTS)-has nerve studies upcoming   Pt states that over time her problems are getting worse  The L knee prosthesis is misplaced along with patella  This throws her off (making balance even worse than it was)  L leg is shorter now  Back pain limits standing (if holding on - max is 30 seconds)   In a normal day -to get around the house she uses a walker   Uses a shower stool (has to sit to shower)  Has handicapped bars in shower and hall  Also raised commode seat and bidet   Today she uses a cane - since she had assistance from another person   It is getting hard to grip cane/walker  Hard to get walker in/out of her trunk  Uses a buggy in the store- to lean on   Grip and strength both prohibit self propelling wheelchair   Her house is outfitted with a scooter   With a walker- can go about 5 feet at a time without rest  Her walker has wheels and breaks and also a seat  (came home with after surgery from the hospital)   Really wants to get out/go shopping  Has never had a wheelchair   At home - can get up by herself as long as there are arms to push up with   If she had wheelchair at home (not self propelled) she would be able to do laundry  She could also get to the counter in the kitchen    Patient Active Problem List   Diagnosis Date Noted  . Trigeminal neuralgia 03/15/2019  . Mobility impaired 12/11/2018  . Poor balance 08/13/2018  . Fall in home 08/13/2018  . Memory loss 06/27/2018  . Physical deconditioning 03/19/2018  . Chronic diastolic heart failure ([place]) 02/09/2018  . Prediabetes 01/26/2017  . Hearing loss 01/19/2016  . Carpal tunnel syndrome 08/31/2015  . Encounter for Medicare annual wellness exam 06/04/2014  . Obesity 06/04/2014  . Estrogen deficiency 06/04/2014  . Colon cancer screening 06/04/2014  . Schatzki's ring 03/27/2014  . OA (osteoarthritis) of knee 08/16/2011  . Hemorrhoids 08/10/2011  . Other dyspnea and respiratory abnormality 06/21/2010  . Urinary incontinence 05/27/2010  . OVERACTIVE BLADDER 03/12/2010  . Lumbar degenerative disc disease 04/13/2009  . GERD 10/15/2008  . CEREBROVASCULAR ACCIDENT, HX OF 10/15/2008  .  Arthropathy, multiple sites 06/26/2008  . EXTERNAL HEMORRHOIDS 02/01/2008  . HIATAL HERNIA 02/01/2008  . DIVERTICULOSIS OF COLON 02/01/2008  . GASTRITIS, HX OF 02/01/2008  . DYSPNEA 09/19/2007  . Vitamin D deficiency 07/03/2007  . Osteoporosis of lumbar spine 06/13/2007  . Fatigue 06/13/2007  . HELICOBACTER PYLORI INFECTION 06/06/2006  . Hyperlipidemia 06/06/2006  . Generalized anxiety disorder 06/06/2006  . Adjustment disorder with mixed anxiety and depressed mood 06/06/2006  . MACULAR DEGENERATION 06/06/2006  . H/O: CVA (cerebrovascular accident) 06/06/2006  . OSTEOARTHRITIS 06/06/2006   Past Medical History:  Diagnosis Date  . Alternating constipation and diarrhea   . Anxiety state,  unspecified   . Complication of anesthesia    slow to wake up with surgery several years ago   . Depressive disorder, not elsewhere classified   . Diaphragmatic hernia without mention of obstruction or gangrene   . Difficulty sleeping   . Diverticulosis of colon (without mention of hemorrhage)   . Esophageal reflux   . External hemorrhoids without mention of complication   . Fatigue   . Frequency of urination   . Helicobacter pylori (H. pylori)   . Hiatal hernia   . History of transfusion   . Lumbar spondylosis   . Macular degeneration (senile) of retina, unspecified   . Osteoarthrosis, unspecified whether generalized or localized, unspecified site 08-11-11   hx. "rhematoid arthritis", osteoarthritis, DDD, bursitis(hip)  . Osteoporosis   . Palpitations   . PONV (postoperative nausea and vomiting)   . Schatzki's ring   . Shortness of breath 08-11-11   with exertion only at present  . Unspecified cerebral artery occlusion with cerebral infarction 08-11-11   '97/ '06( TIA)-affected lt. side, slight weakness L side  . Unspecified vitamin D deficiency    Past Surgical History:  Procedure Laterality Date  . ABDOMINAL HYSTERECTOMY  08-11-11  . APPENDECTOMY    . BACK SURGERY  08-11-11   '11-hx. lumbar fusion with retained hardware  . CATARACT EXTRACTION  08-11-11   Bilateral  . ESOPHAGEAL DILATION    . KNEE SURGERY  08-11-11   rt. knee scope  . PATELLAR TENDON REPAIR Left 02/22/2014   Procedure: PATELLA TENDON REPAIR;  Surgeon: Gearlean Alf, MD;  Location: [facility];  Service: Orthopedics;  Laterality: Left;  . PATELLAR TENDON REPAIR Left 06/25/2014   Procedure: LEFT PATELLA TENDON REPAIR;  Surgeon: Gaynelle Arabian, MD;  Location: [facility];  Service: Orthopedics;  Laterality: Left;  . TOTAL KNEE ARTHROPLASTY  08/16/2011   Procedure: TOTAL KNEE ARTHROPLASTY;  Surgeon: Gearlean Alf, MD;  Location: [facility];  Service: Orthopedics;  Laterality: Right;  . TOTAL KNEE ARTHROPLASTY Left 01/27/2014    Procedure: LEFT TOTAL KNEE ARTHROPLASTY;  Surgeon: Gearlean Alf, MD;  Location: [facility];  Service: Orthopedics;  Laterality: Left;  . TUBAL LIGATION     Social History   Tobacco Use  . Smoking status: Never Smoker  . Smokeless tobacco: Never Used  Substance Use Topics  . Alcohol use: No    Alcohol/week: 0.0 standard drinks  . Drug use: No   Family History  Problem Relation Age of Onset  . Heart disease Father   . Coronary artery disease Brother   . Diabetes Brother   . Heart disease Brother   . Pancreatic cancer Daughter   . Diabetes Sister   . Colon cancer Neg Hx   . Colon polyps Neg Hx   . Esophageal cancer Neg Hx   . Gallbladder disease Neg Hx  Allergies  Allergen Reactions  . Alendronate Sodium     REACTION: JAW PAIN  . Alendronate Sodium Other (See Comments)    REACTION: JAW PAIN  . Celecoxib Other (See Comments)    REACTION: GI UPSET REACTION: GI UPSET  . Omeprazole Nausea Only  . Protonix [Pantoprazole Sodium] Nausea And Vomiting  . Rabeprazole Nausea And Vomiting  . Latex Itching and Rash   Current Outpatient Medications on File Prior to Visit  Medication Sig Dispense Refill  . acetaminophen (TYLENOL) 325 MG tablet Take 2 tablets (650 mg total) by mouth every 6 (six) hours as needed for mild pain (or Fever >/= 101). 40 tablet 0  . ALPRAZolam (XANAX) 0.5 MG tablet TAKE 1 TABLET AT BEDTIME AS NEEDED FOR SLEEP 90 tablet 0  . atorvastatin (LIPITOR) 10 MG tablet Take 1 tablet (10 mg total) by mouth daily. 90 tablet 3  . calcium carbonate (TUMS - DOSED IN MG ELEMENTAL CALCIUM) 500 MG chewable tablet Chew 1 tablet (200 mg of elemental calcium total) by mouth 4 (four) times daily as needed for indigestion or heartburn. 10 tablet 0  . CALCIUM-MAGNESIUM-ZINC PO Take 1 capsule by mouth daily.    . clopidogrel (PLAVIX) 75 MG tablet Take 1 tablet (75 mg total) by mouth daily. 90 tablet 3  . diclofenac sodium (VOLTAREN) 1 % GEL Apply 1 application topically 2 (two) times  daily.    Marland Kitchen esomeprazole (NEXIUM) 40 MG capsule Take 1 capsule (40 mg total) by mouth 2 (two) times daily before a meal. 180 capsule 0  . fluticasone (FLONASE) 50 MCG/ACT nasal spray Place 1-2 sprays into both nostrils daily as needed for allergies or rhinitis.    . furosemide (LASIX) 20 MG tablet Take 1 tablet (20 mg total) by mouth daily. 90 tablet 3  . gabapentin (NEURONTIN) 100 MG capsule Take 2 capsules (200 mg total) by mouth 3 (three) times daily. (Patient taking differently: Take 100 mg by mouth 3 (three) times daily. ) 180 capsule 1  . HYDROcodone-acetaminophen (NORCO) 10-325 MG tablet Take 1 tablet by mouth every 6 (six) hours as needed.   0  . metoprolol succinate (TOPROL XL) 25 MG 24 hr tablet TAKE ONE-HALF (1/2) TABLET AT BEDTIME 45 tablet 3  . Multiple Vitamins-Minerals (PRESERVISION AREDS PO) Take 1 capsule by mouth 2 (two) times daily.    Vladimir Faster Glycol-Propyl Glycol (SYSTANE OP) Apply 1-2 drops to eye 3 (three) times daily as needed (dry eyes.).     Marland Kitchen promethazine (PHENERGAN) 6.25 MG/5ML syrup Take 5 mLs (6.25 mg total) by mouth every 6 (six) hours as needed (cough). 120 mL 0  . sertraline (ZOLOFT) 100 MG tablet Take 2 tablets (200 mg total) by mouth daily. 180 tablet 3   No current facility-administered medications on file prior to visit.    Review of Systems  Constitutional: Negative for activity change, appetite change, fatigue, fever and unexpected weight change.  HENT: Negative for congestion, ear pain, rhinorrhea, sinus pressure and sore throat.   Eyes: Negative for pain, redness and visual disturbance.  Respiratory: Negative for cough, shortness of breath and wheezing.   Cardiovascular: Negative for chest pain and palpitations.  Gastrointestinal: Negative for abdominal pain, blood in stool, constipation and diarrhea.  Endocrine: Negative for polydipsia and polyuria.  Genitourinary: Negative for dysuria, frequency and urgency.  Musculoskeletal: Positive for  arthralgias, back pain, gait problem and joint swelling. Negative for myalgias.  Skin: Negative for pallor and rash.  Allergic/Immunologic: Negative for environmental allergies.  Neurological:  Positive for numbness. Negative for dizziness, syncope and headaches.       In R hand- eval is up coming   Hematological: Negative for adenopathy. Does not bruise/bleed easily.  Psychiatric/Behavioral: Negative for decreased concentration and dysphoric mood. The patient is not nervous/anxious.        Objective:   Physical Exam Constitutional:      General: She is not in acute distress.    Appearance: Normal appearance. She is obese. She is not ill-appearing.     Comments: Frail appearing   HENT:     Head: Normocephalic and atraumatic.  Eyes:     General: No scleral icterus. Cardiovascular:     Rate and Rhythm: Normal rate and regular rhythm.     Pulses: Normal pulses.  Pulmonary:     Effort: Pulmonary effort is normal. No respiratory distress.  Musculoskeletal:        General: Swelling, tenderness and deformity present.     Cervical back: Neck supple.     Right lower leg: No edema.     Left lower leg: No edema.     Comments: Poor rom of LS -cannot stand w/o support   Deformity of L knee with swelling and anteriorly displaced patella , shorter leg length and inability to fully bend/ext that extremity   OA findings in both hands (pain with grip)   Lymphadenopathy:     Cervical: No cervical adenopathy.  Skin:    General: Skin is warm and dry.     Findings: No rash.  Neurological:     Mental Status: She is alert.     Cranial Nerves: Cranial nerves are intact.     Sensory: Sensory deficit present.     Motor: Weakness present. No atrophy, abnormal muscle tone or pronator drift.     Coordination: Coordination abnormal. Finger-Nose-Finger Test normal.     Comments: Decreased sensation in R hand   4/5 grip both hands   (pain and strength play into this)  4/5 strength in LLE   Greatly  favors R leg for walking  Can rise from chair using arms (not without)  Needs both cane and assist to take more than 2 steps Cannot stand more than 30 minutes w/o pain   Not steady enough to perform rhomberg           Assessment & Plan:   Problem List Items Addressed This Visit      Musculoskeletal and Integument   Arthropathy, multiple sites    Pt has notable osteoarthritis in hands and knees  Difficult to grip and difficult to walk  Also complications of L knee replacement (patella is now floating superiorly) makes it difficult to lift leg or walk at all      Lumbar degenerative disc disease    Chronic pain from this disables pt from standing more than 30 seconds at a time with support         Other   Obesity   Poor balance    Age and CVA as well as inability to stand on L leg impact this  She has had falls  Wheelchair would help prevent falls      Mobility impaired - Primary    Due to OA of knees/hands and spine Also deg disc dz in spine  Also poor balance related to age and old cva  Also from L sided weakness from cva   She needs assistance with ADLs and can ambulate only very short distances with walker (5 feet  or less)  Can stand less than 30 seconds   A motorized wheelchair would help greatly with ability to perform ADLs and also to get out of house/shopping, etc   Ref made for PT/OT eval for wheelchair (filled out forms from [employer])

## 2019-05-13 NOTE — Assessment & Plan Note (Signed)
L sided weakness Affects gait and grip

## 2019-05-13 NOTE — Assessment & Plan Note (Signed)
Age and CVA as well as inability to stand on L leg impact this  She has had falls  Wheelchair would help prevent falls

## 2019-05-13 NOTE — Assessment & Plan Note (Signed)
Chronic pain from this disables pt from standing more than 30 seconds at a time with support

## 2019-06-03 ENCOUNTER — Other Ambulatory Visit: Payer: Self-pay

## 2019-06-03 MED ORDER — GABAPENTIN 100 MG PO CAPS: 200.0000 mg | ORAL_CAPSULE | Freq: Three times a day (TID) | ORAL | 3 refills | Status: DC

## 2019-06-03 NOTE — Telephone Encounter (Signed)
Pt left v/m that her severe H/A came back the evening of 06/01/19. Pt has requested refill gabapentin from [facility].  Name of Medication:gabapentin 100 mg  Name of Pharmacy: [facility] Last Fill or Written Date and Quantity:# 180 x 1 on 03/15/19  Last Office Visit and Type:04/29/19 med wellness  Next Office Visit and Type: none scheduled

## 2019-06-04 DIAGNOSIS — M5136 Other intervertebral disc degeneration, lumbar region: Secondary | ICD-10-CM | POA: Diagnosis not present

## 2019-06-04 DIAGNOSIS — Z7409 Other reduced mobility: Secondary | ICD-10-CM | POA: Diagnosis not present

## 2019-06-04 DIAGNOSIS — Z8673 Personal history of transient ischemic attack (TIA), and cerebral infarction without residual deficits: Secondary | ICD-10-CM | POA: Diagnosis not present

## 2019-06-04 DIAGNOSIS — I5032 Chronic diastolic (congestive) heart failure: Secondary | ICD-10-CM | POA: Diagnosis not present

## 2019-06-17 DIAGNOSIS — H353132 Nonexudative age-related macular degeneration, bilateral, intermediate dry stage: Secondary | ICD-10-CM | POA: Diagnosis not present

## 2019-07-02 DIAGNOSIS — Z1212 Encounter for screening for malignant neoplasm of rectum: Secondary | ICD-10-CM | POA: Diagnosis not present

## 2019-07-02 DIAGNOSIS — Z1211 Encounter for screening for malignant neoplasm of colon: Secondary | ICD-10-CM | POA: Diagnosis not present

## 2019-07-03 LAB — COLOGUARD: Cologuard: NEGATIVE

## 2019-07-04 ENCOUNTER — Telehealth: Payer: Self-pay | Admitting: Family Medicine

## 2019-07-04 NOTE — Telephone Encounter (Signed)
If she finds out that the person with a fever has covid - she should herself be tested for covid no sooner than 5 days after exposure to that person If that is negative (and she screens neg) for covid-she should be seen in the office/otherwise a virtual visit

## 2019-07-04 NOTE — Telephone Encounter (Signed)
Pt notified of Dr. Marliss Coots comments and instructions and verbalized understanding. Pt will get tested for covid then schedule an appt to discuss HA. Pt said she's had the HA since being diagnosed with covid in Jan

## 2019-07-04 NOTE — Telephone Encounter (Signed)
Patient called today , She stated for the last couple days she has not been feeling well.  Asked patient what type of symptoms she was having and she stated that she has been having a headache almost everyday. Patient stated she had covid back in Jan and doesn't feel like she has fully recovered. Patient denied any fever, congestion, sore throat, vomiting, diarrhea or sob  Patient stated she was around someone recently who ended up with a fever of 103 last night,. She wanted to know what she should do  She also wanted to know how she could be tested to see if still has Immunity to covid.

## 2019-07-23 NOTE — Telephone Encounter (Signed)
Pt said she tested positive for covid 01/17/20. pt said the person she was concerned about 07/04/19 did not have covid so pt said she did not go to be tested for covid. Pt said consistently have a h/a (pain level 8-9); pt has possible covid symptoms of severe H/A, red eye with both eyes and still has no taste or smell. No other covid symptoms at this time. Pt said she heard about the [facility] for covid pts and pt request referral to the [facility]. Pt scheduled virtual visit per 07/04/19 note with Dr Glori Bickers on 07/24/19 at 3 pm. UC & ED precautions given and pt voiced understanding. FYI to Dr Glori Bickers.

## 2019-07-23 NOTE — Telephone Encounter (Signed)
I would like to set her up for visit at the post covid clinic- how do I do that?  Thanks

## 2019-07-24 ENCOUNTER — Telehealth: Payer: Medicare Other | Admitting: Family Medicine

## 2019-07-24 NOTE — Telephone Encounter (Signed)
Advised pt of apt date and time and if anything is needed before apt to contact this office. Pt verbalized understanding. Cancelled apt for today with Dr. Glori Bickers.

## 2019-07-24 NOTE — Telephone Encounter (Signed)
Contacted [facility] at [phone], at [address], [place], and spoke with [facility]. Made soonest apt they had for 07/30/19 at 11am. She said if this does not work for the pt that she could call directly to the clinic.

## 2019-07-25 ENCOUNTER — Encounter: Payer: Self-pay | Admitting: Family Medicine

## 2019-07-30 ENCOUNTER — Other Ambulatory Visit: Payer: Self-pay

## 2019-07-30 ENCOUNTER — Ambulatory Visit (INDEPENDENT_AMBULATORY_CARE_PROVIDER_SITE_OTHER): Payer: Medicare Other | Admitting: Nurse Practitioner

## 2019-07-30 VITALS — HR 54 | Temp 97.9°F

## 2019-07-30 DIAGNOSIS — Z8616 Personal history of COVID-19: Secondary | ICD-10-CM | POA: Insufficient documentation

## 2019-07-30 DIAGNOSIS — R5383 Other fatigue: Secondary | ICD-10-CM

## 2019-07-30 DIAGNOSIS — R439 Unspecified disturbances of smell and taste: Secondary | ICD-10-CM | POA: Insufficient documentation

## 2019-07-30 DIAGNOSIS — R519 Headache, unspecified: Secondary | ICD-10-CM

## 2019-07-30 DIAGNOSIS — R413 Other amnesia: Secondary | ICD-10-CM

## 2019-07-30 NOTE — Assessment & Plan Note (Signed)
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 Brett Fairy-  for further evaluation  Hand out given on memory care tips  Headache log given to patient - please use this to keep a record of headaches for upcoming visit with neurology.   Fatigue:  Stay active  Recent lab work from PCP normal    Per patient request will check Covid Antibody test  Follow up:  Follow up in 2 months or sooner if needed - may need PT if fatigue/strength is not improving

## 2019-07-30 NOTE — Progress Notes (Signed)
@Patient  ID: [name] [name], female    DOB: [birth year], 85 y.o.   MRN: [id number]  Chief Complaint  Patient presents with  . history of covid    loss of taste and smell, fatigue, chronic headache, fatigue, memory loss    Referring provider: Abner Greenspan, MD   85 year old female with history of chronic diastolic heart failure, GERD, trigeminal neuralgia, anxiety. Diagnosed with Covid in December 2020.   HPI   Patient presents today for post Covid care clinic visit.  Patient was diagnosed with Covid in December 2020.  Patient did receive monoclonal antibody infusion at that time.  Patient states that she continues to have ongoing memory loss, brain fog, fatigue, loss of taste and smell, chronic headaches.  Patient states that she was diagnosed with a flare of trigeminal neuralgia soon after being diagnosed with Covid and was treated with gabapentin.  She states that this did help, but she does continue to have headaches.  She describes these headaches as throbbing and feel like a band around the head.  Patient states that she did not have issues with memory loss or brain fog before Covid but now has trouble at times recalling who people are /  remembering her words.  Patient states that she has fatigue but tries to stay active at home.  She does walk with a cane.  She does care for herself independently at home.  She lives with her son currently.  Patient recently had a full lab work-up by PCP and overall labs looked good.  Patient reports that her taste and smell never returned after having Covid.  She does seem to have partial taste and smell but states that everything just taste very bland.  She has a hard time distinguishing one taste from another.  Denies f/c/s, n/v/d, hemoptysis, PND, chest pain or edema.       Allergies  Allergen Reactions  . Alendronate Sodium     REACTION: JAW PAIN  . Alendronate Sodium Other (See Comments)    REACTION: JAW PAIN  . Celecoxib Other (See  Comments)    REACTION: GI UPSET REACTION: GI UPSET  . Omeprazole Nausea Only  . Protonix [Pantoprazole Sodium] Nausea And Vomiting  . Rabeprazole Nausea And Vomiting  . Latex Itching and Rash    Immunization History  Administered Date(s) Administered  . Influenza Split 10/17/2011  . Influenza Whole 10/24/2003, 10/19/2006, 10/15/2007, 11/15/2008, 10/17/2009  . Influenza, High Dose Seasonal PF 10/25/2016, 09/28/2017  . Influenza, Seasonal, Injecte, Preservative Fre 09/26/2018  . Influenza,inj,Quad PF,6+ Mos 09/19/2012, 11/26/2013  . Influenza-Unspecified 11/10/2014, 09/18/2015  . Pneumococcal Conjugate-13 05/07/2013  . Pneumococcal Polysaccharide-23 01/03/2002, 06/13/2007  . Td 10/09/1996, 06/13/2007  . Zoster 03/21/2013  . Zoster Recombinat (Shingrix) 09/26/2018, 12/19/2018    Past Medical History:  Diagnosis Date  . Alternating constipation and diarrhea   . Anxiety state, unspecified   . Complication of anesthesia    slow to wake up with surgery several years ago   . Depressive disorder, not elsewhere classified   . Diaphragmatic hernia without mention of obstruction or gangrene   . Difficulty sleeping   . Diverticulosis of colon (without mention of hemorrhage)   . Esophageal reflux   . External hemorrhoids without mention of complication   . Fatigue   . Frequency of urination   . Helicobacter pylori (H. pylori)   . Hiatal hernia   . History of transfusion   . Lumbar spondylosis   . Macular degeneration (senile) of  retina, unspecified   . Osteoarthrosis, unspecified whether generalized or localized, unspecified site 08-11-11   hx. "rhematoid arthritis", osteoarthritis, DDD, bursitis(hip)  . Osteoporosis   . Palpitations   . PONV (postoperative nausea and vomiting)   . Schatzki's ring   . Shortness of breath 08-11-11   with exertion only at present  . Unspecified cerebral artery occlusion with cerebral infarction 08-11-11   '97/ '06( TIA)-affected lt. side, slight  weakness L side  . Unspecified vitamin D deficiency     Tobacco History: Social History   Tobacco Use  Smoking Status Never Smoker  Smokeless Tobacco Never Used   Counseling given: Yes   Outpatient Encounter Medications as of 07/30/2019  Medication Sig  . acetaminophen (TYLENOL) 325 MG tablet Take 2 tablets (650 mg total) by mouth every 6 (six) hours as needed for mild pain (or Fever >/= 101).  Marland Kitchen ALPRAZolam (XANAX) 0.5 MG tablet TAKE 1 TABLET AT BEDTIME AS NEEDED FOR SLEEP  . atorvastatin (LIPITOR) 10 MG tablet Take 1 tablet (10 mg total) by mouth daily.  . calcium carbonate (TUMS - DOSED IN MG ELEMENTAL CALCIUM) 500 MG chewable tablet Chew 1 tablet (200 mg of elemental calcium total) by mouth 4 (four) times daily as needed for indigestion or heartburn.  Marland Kitchen CALCIUM-MAGNESIUM-ZINC PO Take 1 capsule by mouth daily.  . clopidogrel (PLAVIX) 75 MG tablet Take 1 tablet (75 mg total) by mouth daily.  . diclofenac sodium (VOLTAREN) 1 % GEL Apply 1 application topically 2 (two) times daily.  Marland Kitchen esomeprazole (NEXIUM) 40 MG capsule Take 1 capsule (40 mg total) by mouth 2 (two) times daily before a meal.  . fluticasone (FLONASE) 50 MCG/ACT nasal spray Place 1-2 sprays into both nostrils daily as needed for allergies or rhinitis.  . furosemide (LASIX) 20 MG tablet Take 1 tablet (20 mg total) by mouth daily.  Marland Kitchen gabapentin (NEURONTIN) 100 MG capsule Take 2 capsules (200 mg total) by mouth 3 (three) times daily.  Marland Kitchen HYDROcodone-acetaminophen (NORCO) 10-325 MG tablet Take 1 tablet by mouth every 6 (six) hours as needed.   . metoprolol succinate (TOPROL XL) 25 MG 24 hr tablet TAKE ONE-HALF (1/2) TABLET AT BEDTIME  . Multiple Vitamins-Minerals (PRESERVISION AREDS PO) Take 1 capsule by mouth 2 (two) times daily.  Vladimir Faster Glycol-Propyl Glycol (SYSTANE OP) Apply 1-2 drops to eye 3 (three) times daily as needed (dry eyes.).   Marland Kitchen promethazine (PHENERGAN) 6.25 MG/5ML syrup Take 5 mLs (6.25 mg total) by mouth  every 6 (six) hours as needed (cough).  . sertraline (ZOLOFT) 100 MG tablet Take 2 tablets (200 mg total) by mouth daily.   No facility-administered encounter medications on file as of 07/30/2019.     Review of Systems  Review of Systems  Constitutional: Positive for fatigue. Negative for chills and fever.  HENT: Negative.   Respiratory: Negative for cough and shortness of breath.   Cardiovascular: Negative.  Negative for chest pain, palpitations and leg swelling.  Gastrointestinal: Negative.   Allergic/Immunologic: Negative.   Neurological: Positive for weakness (generalized) and headaches.       Loss of taste and smell  Psychiatric/Behavioral: Positive for decreased concentration.       Physical Exam  Pulse (!) 54   Temp 97.9 F (36.6 C)   SpO2 97% Comment: RA  Wt Readings from Last 5 Encounters:  05/13/19 193 lb 2 oz (87.6 kg)  04/29/19 192 lb 8 oz (87.3 kg)  02/06/19 191 lb (86.6 kg)  12/11/18 195 lb 1  oz (88.5 kg)  11/02/18 195 lb 3.2 oz (88.5 kg)     Physical Exam Vitals and nursing note reviewed.  Constitutional:      General: She is not in acute distress.    Appearance: She is well-developed.  Cardiovascular:     Rate and Rhythm: Normal rate and regular rhythm.  Pulmonary:     Effort: Pulmonary effort is normal.     Breath sounds: Normal breath sounds. No wheezing or rhonchi.  Musculoskeletal:     Right lower leg: No edema.     Left lower leg: No edema.  Neurological:     Mental Status: She is alert and oriented to person, place, and time.  Psychiatric:        Mood and Affect: Mood normal.        Behavior: Behavior normal.        Assessment & Plan:   History of COVID-19 Chronic headaches after Covid Memory loss Olfactory Dysfunction:  Will refer to neurology - Dr. Brett Fairy-  for further evaluation  Hand out given on memory care tips  Headache log given to patient - please use this to keep a record of headaches for upcoming visit with  neurology.   Fatigue:  Stay active  Recent lab work from PCP normal    Per patient request will check Covid Antibody test  Follow up:  Follow up in 2 months or sooner if needed - may need PT if fatigue/strength is not improving        Fenton Foy, NP 07/30/2019

## 2019-07-30 NOTE — Patient Instructions (Addendum)
History of Covid  Chronic headaches after Covid Memory loss Olfactory Dysfunction:  Will refer to neurology - Dr. Brett Fairy-  for further evaluation  Hand out given on memory care tips  Headache log given to patient - please use this to keep a record of headaches for upcoming visit with neurology.   Fatigue:  Stay active  Recent lab work from PCP normal    Per patient request will check Covid Antibody test  Follow up:  Follow up in 2 months or sooner if needed - may need PT if fatigue/strength is not improving

## 2019-07-31 LAB — SAR COV2 SEROLOGY (COVID19)AB(IGG),IA: DiaSorin SARS-CoV-2 Ab, IgG: POSITIVE

## 2019-08-04 ENCOUNTER — Other Ambulatory Visit: Payer: Self-pay | Admitting: Family Medicine

## 2019-09-03 ENCOUNTER — Telehealth: Payer: Self-pay | Admitting: Cardiovascular Disease

## 2019-09-03 ENCOUNTER — Encounter: Payer: Self-pay | Admitting: Physician Assistant

## 2019-09-03 NOTE — Telephone Encounter (Signed)
Called pt to inquire if message stated why the person was calling.  She states it was a rescheduling issue but she got it all straightened out.  Cancelled her appt with Nicki Reaper on 10/26 and rescheduled with Dr. Burt Knack in January.  Advised to call sooner if any issues.  Pt appreciative for call.

## 2019-09-03 NOTE — Telephone Encounter (Signed)
New Message  Patient is returning a missed call to the office. Please give patient a call back if called.

## 2019-09-06 ENCOUNTER — Ambulatory Visit (INDEPENDENT_AMBULATORY_CARE_PROVIDER_SITE_OTHER): Payer: Medicare Other | Admitting: Neurology

## 2019-09-06 ENCOUNTER — Encounter: Payer: Self-pay | Admitting: Neurology

## 2019-09-06 VITALS — BP 119/75 | HR 66 | Ht 65.0 in | Wt 198.0 lb

## 2019-09-06 DIAGNOSIS — G44229 Chronic tension-type headache, not intractable: Secondary | ICD-10-CM

## 2019-09-06 DIAGNOSIS — R430 Anosmia: Secondary | ICD-10-CM | POA: Diagnosis not present

## 2019-09-06 DIAGNOSIS — Z8616 Personal history of COVID-19: Secondary | ICD-10-CM

## 2019-09-06 DIAGNOSIS — R432 Parageusia: Secondary | ICD-10-CM | POA: Diagnosis not present

## 2019-09-06 NOTE — Patient Instructions (Signed)
"  They swell, they get injured by the virus, but they recover," he told [employer] on Thursday. "For some people, perhaps, the initial insult was too much and the olfactory nerve doesn't return." In some cases, Piccirillo said, the olfactory function comes back, but the smell is distorted.    In terms of treatment, Piccirillo said both oral steroids and steroid nose sprays can and should be used early on. For those suffering from long-term loss of smell and taste, Piccirillo said there is a process, called olfactory training, which involves training the nose to smell again. He said it traditionally works by having the patient smell four different types of oil, including rose, eucalyptus, lemon, and cloves, twice a day for up to 12 weeks.  "The idea behind olfactory training is that you are training or retraining your brain to recognize the smell," he explained. "It's a process called neuroplasticity - training the brain to relearn its smell."  Piccirillo said olfactory training is commonly used as a treatment for both anosmia (loss of smell) and hyposmia or parosmia (distorted smell).  In an effort to improve the treatment, Piccirillo is conducting a clinical trial in which patients are allowed to choose which scents they would like to retrain their brain to smell and they are given a photo to look at while they're doing the training of the item they're trying to smell.  Piccirillo said he's fascinated by the high number of people who have had their senses of smell and taste affected during the pandemic and he's curious why these symptoms become chronic or long-term for some.  "It's a real public health problem," he said. "These are the things that fascinate me, but more importantly, motivate me to try to find a treatment."

## 2019-09-06 NOTE — Progress Notes (Signed)
SLEEP MEDICINE CLINIC    Provider:  Larey Seat, MD  Primary Care Physician:  Tower, Wynelle Fanny, MD [place] [address]     Referring Provider: Abner Greenspan, [place],  [place] [address]          Chief Complaint according to patient   Patient presents with:    . New Patient (Initial Visit)           HISTORY OF PRESENT ILLNESS:  [name] [name] is a 85 y.o. year old  Caucasian female patient and seen here as a referral on 09/06/2019 from Dr Glori Bickers.   I have the pleasure of seeing [name] [name] 09-05-2017, a right -handed White or Caucasian female who  has a past medical history of Alternating constipation and diarrhea, Anxiety state, unspecified, Complication of anesthesia, COVID-19 virus infection (12/2018), Depressive disorder, not elsewhere classified, Diaphragmatic hernia without mention of obstruction or gangrene, Difficulty sleeping, Diverticulosis of colon (without mention of hemorrhage), Esophageal reflux, External hemorrhoids without mention of complication, Fatigue, Frequency of urination, Helicobacter pylori (H. 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   Mrs. Carren Blakley. [name] presents today after she had recently been seen in the Covid care clinic.  She was diagnosed with an infection of COVID-19 in December 2020  ( Christmas , her sister infected her- as she did her son) she did receive a monoclonal antibody infusion at that time for treatment.  She has felt that she has not recovered completely on the contrary she has ongoing  problems with memory, excessive fatigue, myalgia, headaches and still trouble with loss of taste and smell.  She did have a pre-existing history of trigeminal neuralgia which flared up after she was infected and she was treated with gabapentin for that specific symptom.  While it controlled the trigeminal pain it did not control the headaches she had expired.  These headaches are throbbing and feel like a band around the hand almost like a vice around the head.  They are worse in the occipital and nape of the neck.  She does walk with a cane and she is independently living at her private home she lives currently with her son.  She had been fully worked up by her primary care physician but she is bothered that her taste and smell has never returned.  She has had only one taste and no smell sensation since.  Her son had heard taste from very hot she will eat pickles.  And she could sense the spies but it is much more of an overall visceral response the heat overwhelmed her body system it was not a true taste or smell sensation at all.  I explained to [name] [name] that we are trying to implement the [employer] [facility] school approach to recalibrating smell and taste sensations.  Smell and taste sensations are separated in different categories such as it salty sweet and sour.  The patients are asked to smell something of each category each day before they needed.  This can be used for example grapefruit oil that can be smelled and then also ingested for example as drops on a hot drink or hot water reduce the same with  menthol or peppermint oil first who sent has to be appreciated or try to, then the oral component comes into play.  For better and salty similar steps are taken as a taste sensation only there is usually not a true bitter smell except for better almonds or a roast.       Review of Systems: Out of a complete 14 system review, the patient complains of only the following symptoms, and all other  reviewed systems are negative.:   Anosmia. Ageusia.   Trigeminal neuralgia-   Vaccine - afraid ! She has not gotten it !    Social History   Socioeconomic History  . Marital status: Legally Separated    Spouse name: Not on file  . Number of children: 3  . Years of education: Not on file  . Highest education level: Not on file  Occupational History  . Occupation: disabled    Employer: RETIRED  Tobacco Use  . Smoking status: Never Smoker  . Smokeless tobacco: Never Used  Vaping Use  . Vaping Use: Never used  Substance and Sexual Activity  . Alcohol use: No    Alcohol/week: 0.0 standard drinks  . Drug use: No  . Sexual activity: Never  Other Topics Concern  . Not on file  Social History Narrative  . Not on file   Social Determinants of Health   Financial Resource Strain:   . Difficulty of Paying Living Expenses: Not on file  Food Insecurity:   . Worried About Charity fundraiser in the Last Year: Not on file  . Ran Out of Food in the Last Year: Not on file  Transportation Needs:   . Lack of Transportation (Medical): Not on file  . Lack of Transportation (Non-Medical): Not on file  Physical Activity:   . Days of Exercise per Week: Not on file  . Minutes of Exercise per Session: Not on file  Stress:   . Feeling of Stress : Not on file  Social Connections:   . Frequency of Communication with Friends and Family: Not on file  . Frequency of Social Gatherings with Friends and Family: Not on file  . Attends Religious Services: Not on file  . Active Member of Clubs or Organizations: Not on file  . Attends Archivist Meetings: Not on file  . Marital Status: Not on file    Family History  Problem Relation Age of Onset  . Heart disease Father   . Coronary artery disease Brother   . Diabetes Brother   . Heart disease Brother   . Pancreatic cancer Daughter   . Diabetes Sister   . Colon cancer Neg Hx   . Colon polyps Neg Hx   . Esophageal cancer Neg Hx     . Gallbladder disease Neg Hx     Past Medical History:  Diagnosis Date  . Alternating constipation and diarrhea   . Anxiety state, unspecified   . Complication of anesthesia    slow to wake up with surgery several years ago   . COVID-19 virus infection 12/2018  . Depressive disorder, not elsewhere classified   . Diaphragmatic hernia without mention of obstruction or gangrene   . Difficulty sleeping   . Diverticulosis of colon (without mention of hemorrhage)   . Esophageal reflux   . External hemorrhoids without mention of complication   . Fatigue   . Frequency of urination   . Helicobacter pylori (H. pylori)   . Hiatal hernia   . History  of transfusion   . Lumbar spondylosis   . Macular degeneration (senile) of retina, unspecified   . Osteoarthrosis, unspecified whether generalized or localized, unspecified site 08-11-11   hx. "rhematoid arthritis", osteoarthritis, DDD, bursitis(hip)  . Osteoporosis   . Palpitations   . PONV (postoperative nausea and vomiting)   . Schatzki's ring   . Shortness of breath 08-11-11   with exertion only at present  . Unspecified cerebral artery occlusion with cerebral infarction 08-11-11   '97/ '06( TIA)-affected lt. side, slight weakness L side  . Unspecified vitamin D deficiency     Past Surgical History:  Procedure Laterality Date  . ABDOMINAL HYSTERECTOMY  08-11-11  . APPENDECTOMY    . BACK SURGERY  08-11-11   '11-hx. lumbar fusion with retained hardware  . CATARACT EXTRACTION  08-11-11   Bilateral  . ESOPHAGEAL DILATION    . KNEE SURGERY  08-11-11   rt. knee scope  . PATELLAR TENDON REPAIR Left 02/22/2014   Procedure: PATELLA TENDON REPAIR;  Surgeon: Gearlean Alf, MD;  Location: [facility];  Service: Orthopedics;  Laterality: Left;  . PATELLAR TENDON REPAIR Left 06/25/2014   Procedure: LEFT PATELLA TENDON REPAIR;  Surgeon: Gaynelle Arabian, MD;  Location: [facility];  Service: Orthopedics;  Laterality: Left;  . TOTAL KNEE ARTHROPLASTY  08/16/2011    Procedure: TOTAL KNEE ARTHROPLASTY;  Surgeon: Gearlean Alf, MD;  Location: [facility];  Service: Orthopedics;  Laterality: Right;  . TOTAL KNEE ARTHROPLASTY Left 01/27/2014   Procedure: LEFT TOTAL KNEE ARTHROPLASTY;  Surgeon: Gearlean Alf, MD;  Location: [facility];  Service: Orthopedics;  Laterality: Left;  . TUBAL LIGATION       Current Outpatient Medications on File Prior to Visit  Medication Sig Dispense Refill  . acetaminophen (TYLENOL) 325 MG tablet Take 2 tablets (650 mg total) by mouth every 6 (six) hours as needed for mild pain (or Fever >/= 101). 40 tablet 0  . ALPRAZolam (XANAX) 0.5 MG tablet TAKE 1 TABLET AT BEDTIME AS NEEDED FOR SLEEP 90 tablet 0  . atorvastatin (LIPITOR) 10 MG tablet Take 1 tablet (10 mg total) by mouth daily. 90 tablet 3  . calcium carbonate (TUMS - DOSED IN MG ELEMENTAL CALCIUM) 500 MG chewable tablet Chew 1 tablet (200 mg of elemental calcium total) by mouth 4 (four) times daily as needed for indigestion or heartburn. 10 tablet 0  . CALCIUM-MAGNESIUM-ZINC PO Take 1 capsule by mouth daily.    . clopidogrel (PLAVIX) 75 MG tablet Take 1 tablet (75 mg total) by mouth daily. 90 tablet 3  . diclofenac sodium (VOLTAREN) 1 % GEL Apply 1 application topically 2 (two) times daily.    Marland Kitchen esomeprazole (NEXIUM) 40 MG capsule TAKE 1 CAPSULE TWICE A DAY BEFORE MEALS 180 capsule 3  . fluticasone (FLONASE) 50 MCG/ACT nasal spray Place 1-2 sprays into both nostrils daily as needed for allergies or rhinitis.    . furosemide (LASIX) 20 MG tablet Take 1 tablet (20 mg total) by mouth daily. 90 tablet 3  . gabapentin (NEURONTIN) 100 MG capsule Take 2 capsules (200 mg total) by mouth 3 (three) times daily. 180 capsule 3  . HYDROcodone-acetaminophen (NORCO) 10-325 MG tablet Take 1 tablet by mouth every 6 (six) hours as needed.   0  . metoprolol succinate (TOPROL XL) 25 MG 24 hr tablet TAKE ONE-HALF (1/2) TABLET AT BEDTIME 45 tablet 3  . Multiple Vitamins-Minerals (PRESERVISION AREDS PO)  Take 1 capsule by mouth 2 (two) times daily.    Marland Kitchen  Polyethyl Glycol-Propyl Glycol (SYSTANE OP) Apply 1-2 drops to eye 3 (three) times daily as needed (dry eyes.).     Marland Kitchen promethazine (PHENERGAN) 6.25 MG/5ML syrup Take 5 mLs (6.25 mg total) by mouth every 6 (six) hours as needed (cough). 120 mL 0  . sertraline (ZOLOFT) 100 MG tablet Take 2 tablets (200 mg total) by mouth daily. 180 tablet 3   No current facility-administered medications on file prior to visit.    Allergies  Allergen Reactions  . Alendronate Sodium     REACTION: JAW PAIN  . Alendronate Sodium Other (See Comments)    REACTION: JAW PAIN  . Celecoxib Other (See Comments)    REACTION: GI UPSET REACTION: GI UPSET  . Omeprazole Nausea Only  . Protonix [Pantoprazole Sodium] Nausea And Vomiting  . Rabeprazole Nausea And Vomiting  . Latex Itching and Rash    Physical exam:  Today's Vitals   09/06/19 1025  BP: 119/75  Pulse: 66  Weight: 198 lb (89.8 kg)  Height: 5\' 5"  (1.651 m)   Body mass index is 32.95 kg/m.   Wt Readings from Last 3 Encounters:  09/06/19 198 lb (89.8 kg)  05/13/19 193 lb 2 oz (87.6 kg)  04/29/19 192 lb 8 oz (87.3 kg)     Ht Readings from Last 3 Encounters:  09/06/19 5\' 5"  (1.651 m)  05/13/19 5' 3.5" (1.613 m)  04/29/19 5' 3.5" (1.613 m)      General: The patient is awake, alert and appears not in acute distress. The patient is well groomed. Head: Normocephalic, atraumatic. Neck is supple.   Cardiovascular:  Regular rate and cardiac rhythm by pulse,  without distended neck veins. Respiratory: Lungs are clear to auscultation.  Skin:  Without evidence of ankle edema, or rash. Trunk: The patient's posture is erect.   Neurologic exam : The patient is awake and alert, oriented to place and time.   Memory subjective described as intact.  Attention span & concentration ability appears normal.  Speech is fluent,  without  dysarthria, dysphonia or aphasia.  Mood and affect are appropriate.     Cranial nerves: severe  loss of smell or taste reported  Pupils are equally pin point- she must have taken opioid medication-  and non-reactive to light. Funduscopic exam deferred.  Extraocular movements in vertical and horizontal planes were intact and without nystagmus. No Diplopia. Visual fields by finger perimetry are intact. Hearing was intact to soft voice and finger rubbing.    Facial sensation intact to fine touch.  Facial motor strength is symmetric and tongue and uvula move midline.  Neck ROM : rotation, tilt and flexion extension were normal for age and shoulder shrug was symmetrical.    Motor exam:  Symmetric bulk, tone - except for the knees- not a normal ROM. she has had bilateral knee replacements.   Normal tone Upper extremities. without cog- wheeling, symmetric grip strength .   Sensory:  Fine touch and vibration - hands are numb , bilaterally, carpal tunnel on the right. No vibration sense on either patella.   Proprioception tested in the upper extremities was normal.   Coordination: Rapid alternating movements in the fingers/hands were of normal speed.  The Finger-to-nose maneuver was intact without evidence of ataxia, dysmetria or tremor.   Gait and station: Patient could rise unassisted from a seated position, walked with the  assistive device, a 4 prong cane. .  Stance is of normal width/ base and the patient turned with 4.5 steps  Toe and  heel walk were deferred.  Deep tendon reflexes: in the upper  extremities are symmetric and intact.  Babinski response was deferred .       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 She is scared to have more side effects as she still struggled long term Covid. She had Ab infusion treatment and steroids/ Dr Chase Caller. 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    My Plan is to proceed with:  1) Encouraged her to continue her gabapentin , she is not a too high a dose. Her children have scared her about this medication.  2) I will implement the "[employer]". For those suffering from long-term loss of smell and taste, we use olfactory training, which involves training the nose to smell again. Traditionally this works by having the patient smell four different types of oil, including rose, eucalyptus, lemon, and cloves, twice a day for up to 12 weeks.  Since the patient has no dyseasthesias or olfactory hallucinations, I will refrain form Tegretol use.    I would like to thank Tower, Wynelle Fanny, MD and [place], [place], [place],  [place] [address] for allowing me to meet with and to take care of this pleasant patient.   In short, [name] [name] is presenting with long haul COVID impairment .   I plan to follow up either personally or through our NP within 2-3 month.   CC: I will share my notes with PCP.  Electronically signed by: Larey Seat, MD 09/06/2019 10:56 AM  [facility] Board certified by The [employer] of Sleep Medicine and Diplomate of the [employer] of Sleep Medicine. Board certified In Neurology through the [employer], Fellow of the [employer] of Neurology. Medical Director of [employer].

## 2019-09-17 ENCOUNTER — Other Ambulatory Visit: Payer: Self-pay | Admitting: Family Medicine

## 2019-09-17 ENCOUNTER — Telehealth: Payer: Self-pay | Admitting: Family Medicine

## 2019-09-17 DIAGNOSIS — Z1231 Encounter for screening mammogram for malignant neoplasm of breast: Secondary | ICD-10-CM

## 2019-09-17 NOTE — Telephone Encounter (Signed)
Pt called needing phone number to [facility]  I could not find correct number  Please call pt with correct number

## 2019-09-24 NOTE — Telephone Encounter (Signed)
Called and left a detailed message with patient - Gave [facility] number [phone]  Nothing further needed.

## 2019-10-11 ENCOUNTER — Ambulatory Visit (INDEPENDENT_AMBULATORY_CARE_PROVIDER_SITE_OTHER)
Admission: RE | Admit: 2019-10-11 | Discharge: 2019-10-11 | Disposition: A | Discharge status: Home / Self Care | Payer: Medicare Other | Source: Ambulatory Visit | Attending: Family Medicine | Admitting: Family Medicine

## 2019-10-11 ENCOUNTER — Ambulatory Visit
Admission: RE | Admit: 2019-10-11 | Discharge: 2019-10-11 | Disposition: A | Discharge status: Home / Self Care | Payer: Medicare Other | Source: Ambulatory Visit | Attending: Family Medicine | Admitting: Family Medicine

## 2019-10-11 ENCOUNTER — Other Ambulatory Visit: Payer: Self-pay

## 2019-10-11 DIAGNOSIS — Z1231 Encounter for screening mammogram for malignant neoplasm of breast: Secondary | ICD-10-CM | POA: Diagnosis not present

## 2019-10-11 DIAGNOSIS — E2839 Other primary ovarian failure: Secondary | ICD-10-CM

## 2019-10-14 ENCOUNTER — Encounter: Payer: Self-pay | Admitting: *Deleted

## 2019-10-15 DIAGNOSIS — H353132 Nonexudative age-related macular degeneration, bilateral, intermediate dry stage: Secondary | ICD-10-CM | POA: Diagnosis not present

## 2019-10-23 ENCOUNTER — Telehealth: Payer: Self-pay | Admitting: *Deleted

## 2019-10-23 DIAGNOSIS — N3281 Overactive bladder: Secondary | ICD-10-CM | POA: Diagnosis not present

## 2019-10-23 DIAGNOSIS — N3942 Incontinence without sensory awareness: Secondary | ICD-10-CM | POA: Diagnosis not present

## 2019-10-23 MED ORDER — GABAPENTIN 100 MG PO CAPS: 200.0000 mg | ORAL_CAPSULE | Freq: Three times a day (TID) | ORAL | 0 refills | Status: DC

## 2019-10-23 NOTE — Telephone Encounter (Signed)
Received fax requesting Rx to be changed to [facility]. done

## 2019-10-28 ENCOUNTER — Other Ambulatory Visit: Payer: Self-pay | Admitting: Family Medicine

## 2019-11-12 ENCOUNTER — Ambulatory Visit: Payer: Medicare Other | Admitting: Physician Assistant

## 2019-11-14 NOTE — Progress Notes (Signed)
Cardiology Office Note:    Date:  11/15/2019   ID:  [name] [name], DOB October 21, 1934, MRN [id number]  PCP:  Abner Greenspan, MD  [facility] Cardiologist:  Sherren Mocha, MD   [facility] Electrophysiologist:  None   Referring MD: Abner Greenspan, MD   Chief Complaint:  Follow-up (Coronary Ca2+; CHF)    Patient Profile:    [name] [name] is a 85 y.o. female with:   Palpitations   Hx of TIA  Chronic shortness of breath   Myoview 02/2018: low risk   Echocardiogram 02/2018: EF 60-65, Gr 2 DD  Heart failure with preserved ejection fraction   Coronary Ca2+ on Chest CT  Aortic atherosclerosis   Hyperlipidemia   DJD   Anxiety   GERD   Macular degeneration   Osteoporosis   Hx of COVID-19 in 12/2018  Prior CV studies: High Resolution Chest CT 03/13/2018 IMPRESSION: 1. No definitive evidence of interstitial lung disease. Air trapping is indicative of small airways disease. 2. Cholelithiasis. 3. Moderate hiatal hernia. 4. Aortic atherosclerosis (ICD10-170.0). Coronary artery calcification.  Myoview 02/23/2018  EF 69, normal perfusion, low risk   Echocardiogram 02/23/2018 EF 60-65, mild LVH, Gr 2 DD, normal RVSF, RVSP 33.5 mmHg, trivial AI, mild TR  Event monitor 05/2015 Sinus rhythm with PAC's and PVC's, periods of ventricular bigeminy (PVC burden approximately 1%) No tachyarrhythmia or pathologic pauses   History of Present Illness:    [name] [name] was last seen by Dr. Burt [name] in 10/2018.  She returns for follow up. She is here alone. Her breathing is overall stable. She has felt worse over the past several months since she had COVID-19. She has long Covid syndrome. She is followed by Dr. Brett [name]. She was started on gabapentin for frequent headaches. She also has not regained taste or smell. She has occasional chest discomfort related to meals. This is resolved by Tums. She denies exertional chest pain. She has not had syncope, orthopnea or significant  leg swelling.      Past Medical History:  Diagnosis Date  . Alternating constipation and diarrhea   . Anxiety state, unspecified   . Complication of anesthesia    slow to wake up with surgery several years ago   . COVID-19 virus infection 12/2018  . Depressive disorder, not elsewhere classified   . Diaphragmatic hernia without mention of obstruction or gangrene   . Difficulty sleeping   . Diverticulosis of colon (without mention of hemorrhage)   . Esophageal reflux   . External hemorrhoids without mention of complication   . Fatigue   . Frequency of urination   . Helicobacter pylori (H. pylori)   . Hiatal hernia   . History of transfusion   . Lumbar spondylosis   . Macular degeneration (senile) of retina, unspecified   . Osteoarthrosis, unspecified whether generalized or localized, unspecified site 08-11-11   hx. "rhematoid arthritis", osteoarthritis, DDD, bursitis(hip)  . Osteoporosis   . Palpitations   . PONV (postoperative nausea and vomiting)   . Schatzki's ring   . Shortness of breath 08-11-11   with exertion only at present  . Unspecified cerebral artery occlusion with cerebral infarction 08-11-11   '97/ '06( TIA)-affected lt. side, slight weakness L side  . Unspecified vitamin D deficiency     Current Medications: Current Meds  Medication Sig  . acetaminophen (TYLENOL) 325 MG tablet Take 2 tablets (650 mg total) by mouth every 6 (six) hours as needed for mild pain (or Fever >/= 101).  Marland Kitchen  ALPRAZolam (XANAX) 0.5 MG tablet TAKE 1 TABLET AT BEDTIME AS NEEDED FOR SLEEP  . atorvastatin (LIPITOR) 10 MG tablet Take 1 tablet (10 mg total) by mouth daily.  . calcium carbonate (TUMS - DOSED IN MG ELEMENTAL CALCIUM) 500 MG chewable tablet Chew 1 tablet (200 mg of elemental calcium total) by mouth 4 (four) times daily as needed for indigestion or heartburn.  Marland Kitchen CALCIUM-MAGNESIUM-ZINC PO Take 1 capsule by mouth daily.  . clopidogrel (PLAVIX) 75 MG tablet Take 1 tablet (75 mg total) by  mouth daily.  . diclofenac sodium (VOLTAREN) 1 % GEL Apply 1 application topically 2 (two) times daily.  Marland Kitchen esomeprazole (NEXIUM) 40 MG capsule TAKE 1 CAPSULE TWICE A DAY BEFORE MEALS  . fluticasone (FLONASE) 50 MCG/ACT nasal spray Place 1-2 sprays into both nostrils daily as needed for allergies or rhinitis.  Marland Kitchen gabapentin (NEURONTIN) 100 MG capsule Take 2 capsules (200 mg total) by mouth 3 (three) times daily.  Marland Kitchen HYDROcodone-acetaminophen (NORCO) 10-325 MG tablet Take 1 tablet by mouth every 6 (six) hours as needed.   . metoprolol succinate (TOPROL XL) 25 MG 24 hr tablet TAKE ONE-HALF (1/2) TABLET AT BEDTIME  . Multiple Vitamins-Minerals (PRESERVISION AREDS PO) Take 1 capsule by mouth 2 (two) times daily.  Vladimir Faster Glycol-Propyl Glycol (SYSTANE OP) Apply 1-2 drops to eye 3 (three) times daily as needed (dry eyes.).   Marland Kitchen sertraline (ZOLOFT) 100 MG tablet Take 2 tablets (200 mg total) by mouth daily.     Allergies:   Alendronate sodium, Alendronate sodium, Celecoxib, Omeprazole, Protonix [pantoprazole sodium], Rabeprazole, and Latex   Social History   Tobacco Use  . Smoking status: Never Smoker  . Smokeless tobacco: Never Used  Vaping Use  . Vaping Use: Never used  Substance Use Topics  . Alcohol use: No    Alcohol/week: 0.0 standard drinks  . Drug use: No     Family Hx: The patient's family history includes Coronary artery disease in her brother; Diabetes in her brother and sister; Heart disease in her brother and father; Pancreatic cancer in her daughter. There is no history of Colon cancer, Colon polyps, Esophageal cancer, or Gallbladder disease.  Review of Systems  Neurological: Positive for headaches and paresthesias (bilat hands c/s CTS).     EKGs/Labs/Other Test Reviewed:    EKG:  EKG is   ordered today.  The ekg ordered today demonstrates normal sinus rhythm, HR 60, normal axis, nonspecific ST-T wave changes, QTC 432, no change from prior tracing  Recent  Labs: 04/29/2019: ALT 14; BUN 18; Creatinine, Ser 0.85; Hemoglobin 13.9; Platelets 245.0; Potassium 4.1; Sodium 140; TSH 2.59   Recent Lipid Panel Lab Results  Component Value Date/Time   CHOL 167 04/29/2019 12:20 PM   TRIG 96.0 04/29/2019 12:20 PM   HDL 69.90 04/29/2019 12:20 PM   CHOLHDL 2 04/29/2019 12:20 PM   [place] 78 04/29/2019 12:20 PM      Risk Assessment/Calculations:      Physical Exam:    VS:  BP 122/68   Pulse 60   Ht 5\' 5"  (1.651 m)   Wt 202 lb (91.6 kg)   SpO2 98%   BMI 33.61 kg/m     Wt Readings from Last 3 Encounters:  11/15/19 202 lb (91.6 kg)  09/06/19 198 lb (89.8 kg)  05/13/19 193 lb 2 oz (87.6 kg)     Constitutional:      Appearance: Healthy appearance. Not in distress.  Neck:     Vascular: JVD normal.  Pulmonary:     Effort: Pulmonary effort is normal.     Breath sounds: No wheezing. No rales.  Cardiovascular:     Normal rate. Regular rhythm. Normal S1. Normal S2.     Murmurs: There is no murmur.  Edema:    Peripheral edema absent.  Abdominal:     Palpations: Abdomen is soft.  Skin:    General: Skin is warm and dry.  Neurological:     Mental Status: Alert and oriented to person, place and time.     Cranial Nerves: Cranial nerves are intact.       ASSESSMENT & PLAN:    1. Chronic heart failure with preserved ejection fraction ([place]) Echo in February 2020 with normal LV function and moderate diastolic dysfunction. Overall, she is limited by arthritis. NYHA IIb-III. Volume status appears stable. Continue current management.  2. Coronary artery calcification seen on CT scan Myoview in 2020 was low risk and negative for ischemia. She is not really having significant symptoms to suggest angina. Continue clopidogrel, atorvastatin.  3. Aortic atherosclerosis ([facility]) Continue atorvastatin.  4. Mixed hyperlipidemia LDL optimal on most recent lab work.  Continue current Rx.    5. History of COVID-19 She has long Covid syndrome. She is  followed by Dr. Brett [name]. We had a long discussion about the benefits of vaccination. She is hesitant to proceed. I have asked her to discuss this further with Dr. Brett [name] to see what her recommendations would be to get the vaccine in the setting of long Covid syndrome.    Dispo:  Return in 2 months (on 01/27/2020) for Scheduled Follow Up, w/ Dr. Burt [name], in person.   Medication Adjustments/Labs and Tests Ordered: Current medicines are reviewed at length with the patient today.  Concerns regarding medicines are outlined above.  Tests Ordered: Orders Placed This Encounter  Procedures  . EKG 12-Lead   Medication Changes: No orders of the defined types were placed in this encounter.   Signed, [name] Dopp, PA-C  11/15/2019 12:13 PM    [facility] [place], [place],   [address] Phone: [phone]; Fax: [phone]

## 2019-11-15 ENCOUNTER — Encounter: Payer: Self-pay | Admitting: Physician Assistant

## 2019-11-15 ENCOUNTER — Other Ambulatory Visit: Payer: Self-pay

## 2019-11-15 ENCOUNTER — Ambulatory Visit (INDEPENDENT_AMBULATORY_CARE_PROVIDER_SITE_OTHER): Payer: Medicare Other | Admitting: Physician Assistant

## 2019-11-15 VITALS — BP 122/68 | HR 60 | Ht 65.0 in | Wt 202.0 lb

## 2019-11-15 DIAGNOSIS — I2510 Atherosclerotic heart disease of native coronary artery without angina pectoris: Secondary | ICD-10-CM | POA: Diagnosis not present

## 2019-11-15 DIAGNOSIS — I5032 Chronic diastolic (congestive) heart failure: Secondary | ICD-10-CM

## 2019-11-15 DIAGNOSIS — E782 Mixed hyperlipidemia: Secondary | ICD-10-CM | POA: Diagnosis not present

## 2019-11-15 DIAGNOSIS — Z8616 Personal history of COVID-19: Secondary | ICD-10-CM | POA: Diagnosis not present

## 2019-11-15 DIAGNOSIS — I700 Atherosclerosis of aorta: Secondary | ICD-10-CM

## 2019-11-15 NOTE — Patient Instructions (Addendum)
Medication Instructions:  Your physician recommends that you continue on your current medications as directed. Please refer to the Current Medication list given to you today.  *If you need a refill on your cardiac medications before your next appointment, please call your pharmacy*  Lab Work: None ordered today  If you have labs (blood work) drawn today and your tests are completely normal, you will receive your results only by: Marland Kitchen MyChart Message (if you have MyChart) OR . A paper copy in the mail If you have any lab test that is abnormal or we need to change your treatment, we will call you to review the results.  Testing/Procedures: None ordered today  Follow-Up: Keep follow up on 01/27/2020 at 11:00AM with Sherren Mocha, MD

## 2019-11-21 DIAGNOSIS — N3281 Overactive bladder: Secondary | ICD-10-CM | POA: Diagnosis not present

## 2019-11-21 DIAGNOSIS — R3915 Urgency of urination: Secondary | ICD-10-CM | POA: Diagnosis not present

## 2019-11-21 DIAGNOSIS — Z23 Encounter for immunization: Secondary | ICD-10-CM | POA: Diagnosis not present

## 2019-11-21 DIAGNOSIS — R351 Nocturia: Secondary | ICD-10-CM | POA: Diagnosis not present

## 2019-11-21 DIAGNOSIS — K589 Irritable bowel syndrome without diarrhea: Secondary | ICD-10-CM | POA: Diagnosis not present

## 2019-11-25 ENCOUNTER — Other Ambulatory Visit: Payer: Self-pay | Admitting: Cardiovascular Disease

## 2019-12-09 ENCOUNTER — Encounter: Payer: Self-pay | Admitting: Neurology

## 2019-12-09 ENCOUNTER — Ambulatory Visit (INDEPENDENT_AMBULATORY_CARE_PROVIDER_SITE_OTHER): Payer: Medicare Other | Admitting: Neurology

## 2019-12-09 ENCOUNTER — Other Ambulatory Visit: Payer: Self-pay

## 2019-12-09 ENCOUNTER — Other Ambulatory Visit: Payer: Self-pay | Admitting: Neurology

## 2019-12-09 ENCOUNTER — Telehealth: Payer: Self-pay | Admitting: Neurology

## 2019-12-09 VITALS — BP 126/75 | HR 64 | Ht 65.0 in | Wt 203.0 lb

## 2019-12-09 DIAGNOSIS — F419 Anxiety disorder, unspecified: Secondary | ICD-10-CM | POA: Insufficient documentation

## 2019-12-09 DIAGNOSIS — M791 Myalgia, unspecified site: Secondary | ICD-10-CM

## 2019-12-09 DIAGNOSIS — G44229 Chronic tension-type headache, not intractable: Secondary | ICD-10-CM | POA: Diagnosis not present

## 2019-12-09 DIAGNOSIS — F411 Generalized anxiety disorder: Secondary | ICD-10-CM | POA: Diagnosis not present

## 2019-12-09 DIAGNOSIS — I2510 Atherosclerotic heart disease of native coronary artery without angina pectoris: Secondary | ICD-10-CM | POA: Diagnosis not present

## 2019-12-09 DIAGNOSIS — U099 Post covid-19 condition, unspecified: Secondary | ICD-10-CM | POA: Diagnosis not present

## 2019-12-09 DIAGNOSIS — R5381 Other malaise: Secondary | ICD-10-CM | POA: Diagnosis not present

## 2019-12-09 MED ORDER — MELOXICAM 7.5 MG PO TABS: 7.5000 mg | ORAL_TABLET | Freq: Every day | ORAL | 0 refills | Status: DC

## 2019-12-09 MED ORDER — GABAPENTIN 100 MG PO CAPS: 200.0000 mg | ORAL_CAPSULE | Freq: Three times a day (TID) | ORAL | 0 refills | Status: DC

## 2019-12-09 NOTE — Telephone Encounter (Signed)
Medicare/tricare order sent to GI. No auth they will reach out to the patient to schedule.  

## 2019-12-09 NOTE — Progress Notes (Signed)
SLEEP MEDICINE CLINIC    Provider:  Larey Seat, MD  Primary Care Physician:  Tower, Wynelle Fanny, MD [place] [address]     Referring Provider: Abner Greenspan, [place],  [place] [address]          Chief Complaint according to patient   Patient presents with:          pt alone, rm 10. presents for follow up [name]. she has been keeping headache journal. she states that continue. she had some concern of light headed and dizziness but states that she is unsure if related to headaches or taking gapapentin. she did admit later that when she stands she feels that dizziness. Has had urinary accidents. Is tearful.       HISTORY OF PRESENT ILLNESS:  [name] [name] is a 85 y.o. 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   And I had encouraged her to continue to use her gabapentin.   We discussed the [facility] anosmia school and the control of olfactory hallucinations which she did not have.  The pulmonology has been replaced with parosmia abnormal olfactory sensations.  She had developed post Covid anosmia ageusia and headaches as well as myalgia.  She is now tearful, depressed she is to be disturbed by the length of symptoms and the impact it has on her life quality.  She kept a headache diary, headaches are usually described as achy, they can last 6 to 7 hours.  The quality is difficult for her to explain.  Gabapentin has reduced some of her pains and aches but it has not really alleviated all of them.  She kept a food diary but stated that often just after a regular breakfast her headaches will set on and she does not feel that there was a food related trigger. No sputum also took her orthostatic blood pressures sit in the  sitting position her blood pressure was 141/74 with a heart rate regular at 58 bpm standing her blood pressure dropped 226/75 mmHg with a heart rate of sixty-four compensatory elevation she did report dizziness with the change in posture. She has a dizziness each morning when getting up from bed, uses a walker.      Consultation on 09-06-2019:  seen here as a referral  from Dr Glori Bickers.   I have the pleasure of seeing [name] [name] 09-05-2017, a right -handed White or Caucasian female who  has a past medical history of Alternating constipation and diarrhea, Anxiety state, unspecified, Complication of anesthesia, COVID-19 virus infection (12/2018), Depressive disorder, not elsewhere classified, Diaphragmatic hernia without mention of obstruction or gangrene, Difficulty sleeping, Diverticulosis of colon (without mention of hemorrhage), Esophageal reflux, External hemorrhoids without mention of complication, Fatigue, Frequency of urination, Helicobacter pylori (H. 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   Mrs. [name] [name] presents today after she had recently been seen in the Covid care clinic.  She was diagnosed with an infection of COVID-19 in  December 2020  ( Christmas , her sister infected her- as she did her son) she did receive a monoclonal antibody infusion at that time for treatment.  She has felt that she has not recovered completely on the contrary she has ongoing problems with memory, excessive fatigue, myalgia, headaches and still trouble with loss of taste and smell.  She did have a pre-existing  history of trigeminal neuralgia which flared up after she was infected and she was treated with gabapentin for that specific symptom.  While it controlled the trigeminal pain it did not control the headaches she had expired.  These headaches are throbbing and feel like a band around the hand almost like a vice around the head.  They are worse in the occipital and nape of the neck.  She does walk with a cane and she is independently living at her private home she lives currently with her son.  She had been fully worked up by her primary care physician but she is bothered that her taste and smell has never returned.  She has had only one taste and no smell sensation since.  Her son had heard taste from very hot she will eat pickles.  And she could sense the spies but it is much more of an overall visceral response the heat overwhelmed her body system it was not a true taste or smell sensation at all.  I explained to [name] [name] that we are trying to implement the [facility] school approach to recalibrating smell and taste sensations.  Smell and taste sensations are separated in different categories such as it salty sweet and sour.  The patients are asked to smell something of each category each day before they needed.  This can be used for example grapefruit oil that can be smelled and then also ingested for example as drops on a hot drink or hot water reduce the same with menthol or peppermint oil first who sent has to be appreciated or try to, then the oral component comes into play.  For better and salty similar steps are taken as a taste sensation only there is usually not a true bitter smell except for better almonds or a roast.       Review of Systems: Out of a complete 14 system review, the patient complains of only the following symptoms, and all other reviewed systems are negative.:    Severely depressed, myalgia , fatigue.   Anosmia. Ageusia.   Trigeminal neuralgia- resolved  .  Now headaches in the crown and neck-  Tension.   Vaccine - afraid ! She has not gotten it !  She needs to take the vaccine -    Social History   Socioeconomic History  . Marital status: Legally Separated    Spouse name: Not on file  . Number of children: 3  . Years of education: Not on file  . Highest education level: Not on file  Occupational History  . Occupation: disabled    Employer: RETIRED  Tobacco Use  . Smoking status: Never Smoker  . Smokeless tobacco: Never Used  Vaping Use  . Vaping Use: Never used  Substance and Sexual Activity  . Alcohol use: No    Alcohol/week: 0.0 standard drinks  . Drug use: No  . Sexual activity: Never  Other Topics Concern  . Not on file  Social History Narrative  . Not on file   Social Determinants of Health   Financial Resource Strain:   . Difficulty of  Paying Living Expenses: Not on file  Food Insecurity:   . Worried About Charity fundraiser in the Last Year: Not on file  . Ran Out of Food in the Last Year: Not on file  Transportation Needs:   . Lack of Transportation (Medical): Not on file  . Lack of Transportation (Non-Medical): Not on file  Physical Activity:   . Days of Exercise per Week: Not on file  . Minutes of Exercise per Session: Not on file  Stress:   . Feeling of Stress : Not on file  Social Connections:   . Frequency of Communication with Friends and Family: Not on file  . Frequency of Social Gatherings with Friends and Family: Not on file  . Attends Religious Services: Not on file  . Active Member of Clubs or Organizations: Not on file  . Attends Archivist Meetings: Not on file  . Marital Status: Not on file    Family History  Problem Relation Age of Onset  . Heart disease Father   . Coronary artery disease Brother   . Diabetes Brother   . Heart disease Brother   . Pancreatic cancer Daughter   . Diabetes Sister   . Colon cancer Neg Hx   . Colon polyps Neg Hx   . Esophageal cancer Neg  Hx   . Gallbladder disease Neg Hx     Past Medical History:  Diagnosis Date  . Alternating constipation and diarrhea   . Anxiety state, unspecified   . Complication of anesthesia    slow to wake up with surgery several years ago   . COVID-19 virus infection 12/2018  . Depressive disorder, not elsewhere classified   . Diaphragmatic hernia without mention of obstruction or gangrene   . Difficulty sleeping   . Diverticulosis of colon (without mention of hemorrhage)   . Esophageal reflux   . External hemorrhoids without mention of complication   . Fatigue   . Frequency of urination   . Helicobacter pylori (H. pylori)   . Hiatal hernia   . History of transfusion   . Lumbar spondylosis   . Macular degeneration (senile) of retina, unspecified   . Osteoarthrosis, unspecified whether generalized or localized, unspecified site 08-11-11   hx. "rhematoid arthritis", osteoarthritis, DDD, bursitis(hip)  . Osteoporosis   . Palpitations   . PONV (postoperative nausea and vomiting)   . Schatzki's ring   . Shortness of breath 08-11-11   with exertion only at present  . Unspecified cerebral artery occlusion with cerebral infarction 08-11-11   '97/ '06( TIA)-affected lt. side, slight weakness L side  . Unspecified vitamin D deficiency     Past Surgical History:  Procedure Laterality Date  . ABDOMINAL HYSTERECTOMY  08-11-11  . APPENDECTOMY    . BACK SURGERY  08-11-11   '11-hx. lumbar fusion with retained hardware  . CATARACT EXTRACTION  08-11-11   Bilateral  . ESOPHAGEAL DILATION    . KNEE SURGERY  08-11-11   rt. knee scope  . PATELLAR TENDON REPAIR Left 02/22/2014   Procedure: PATELLA TENDON REPAIR;  Surgeon: Gearlean Alf, MD;  Location: [facility];  Service: Orthopedics;  Laterality: Left;  . PATELLAR TENDON REPAIR Left 06/25/2014   Procedure: LEFT PATELLA TENDON REPAIR;  Surgeon: Gaynelle Arabian, MD;  Location: [facility];  Service: Orthopedics;  Laterality: Left;  . TOTAL KNEE ARTHROPLASTY   08/16/2011   Procedure: TOTAL KNEE ARTHROPLASTY;  Surgeon: Gearlean Alf, MD;  Location: [facility];  Service: Orthopedics;  Laterality: Right;  . TOTAL KNEE ARTHROPLASTY Left 01/27/2014   Procedure: LEFT TOTAL KNEE ARTHROPLASTY;  Surgeon: Gearlean Alf, MD;  Location: [facility];  Service: Orthopedics;  Laterality: Left;  . TUBAL LIGATION       Current Outpatient Medications on File Prior to [name]  Medication Sig Dispense Refill  . acetaminophen (TYLENOL) 325 MG tablet Take 2 tablets (650 mg total) by mouth every 6 (six) hours as needed for mild pain (or Fever >/= 101). 40 tablet 0  . ALPRAZolam (XANAX) 0.5 MG tablet TAKE 1 TABLET AT BEDTIME AS NEEDED FOR SLEEP 90 tablet 0  . atorvastatin (LIPITOR) 10 MG tablet Take 1 tablet (10 mg total) by mouth daily. 90 tablet 3  . calcium carbonate (TUMS - DOSED IN MG ELEMENTAL CALCIUM) 500 MG chewable tablet Chew 1 tablet (200 mg of elemental calcium total) by mouth 4 (four) times daily as needed for indigestion or heartburn. 10 tablet 0  . CALCIUM-MAGNESIUM-ZINC PO Take 1 capsule by mouth daily.    . clopidogrel (PLAVIX) 75 MG tablet Take 1 tablet (75 mg total) by mouth daily. 90 tablet 3  . diclofenac sodium (VOLTAREN) 1 % GEL Apply 1 application topically 2 (two) times daily.    Marland Kitchen esomeprazole (NEXIUM) 40 MG capsule TAKE 1 CAPSULE TWICE A DAY BEFORE MEALS 180 capsule 3  . fluticasone (FLONASE) 50 MCG/ACT nasal spray Place 1-2 sprays into both nostrils daily as needed for allergies or rhinitis.    Marland Kitchen gabapentin (NEURONTIN) 100 MG capsule Take 2 capsules (200 mg total) by mouth 3 (three) times daily. 540 capsule 0  . HYDROcodone-acetaminophen (NORCO) 10-325 MG tablet Take 1 tablet by mouth every 6 (six) hours as needed.   0  . metoprolol succinate (TOPROL-XL) 25 MG 24 hr tablet TAKE ONE-HALF (1/2) TABLET BY MOUTH AT BEDTIME 45 tablet 3  . Multiple Vitamins-Minerals (PRESERVISION AREDS PO) Take 1 capsule by mouth 2 (two) times daily.    Vladimir Faster  Glycol-Propyl Glycol (SYSTANE OP) Apply 1-2 drops to eye 3 (three) times daily as needed (dry eyes.).     Marland Kitchen sertraline (ZOLOFT) 100 MG tablet Take 2 tablets (200 mg total) by mouth daily. 180 tablet 3  . furosemide (LASIX) 20 MG tablet Take 1 tablet (20 mg total) by mouth daily. 90 tablet 3   No current facility-administered medications on file prior to [name].    Allergies  Allergen Reactions  . Alendronate Sodium     REACTION: JAW PAIN  . Alendronate Sodium Other (See Comments)    REACTION: JAW PAIN  . Celecoxib Other (See Comments)    REACTION: GI UPSET REACTION: GI UPSET  . Omeprazole Nausea Only  . Protonix [Pantoprazole Sodium] Nausea And Vomiting  . Rabeprazole Nausea And Vomiting  . Latex Itching and Rash    Physical exam:  Today's Vitals   12/09/19 1054 12/09/19 1055  BP: (!) 141/74 126/75  Pulse: (!) 58 64  Weight: 203 lb (92.1 kg)   Height: 5\' 5"  (1.651 m)    Body mass index is 33.78 kg/m.   Wt Readings from Last 3 Encounters:  12/09/19 203 lb (92.1 kg)  11/15/19 202 lb (91.6 kg)  09/06/19 198 lb (89.8 kg)     Ht Readings from Last 3 Encounters:  12/09/19 5\' 5"  (1.651 m)  11/15/19 5\' 5"  (1.651 m)  09/06/19 5\' 5"  (1.651 m)      General: The patient is awake, alert and appears not in acute distress. The patient is well  groomed. Head: Normocephalic, atraumatic. Neck is supple.   Cardiovascular:  Regular rate and cardiac rhythm by pulse without distended neck veins. Respiratory: Lungs are clear to auscultation.  Skin:  Without evidence of ankle edema, or rash. Trunk: The patient's posture is erect.   Neurologic exam : The patient is awake and alert, oriented to place and time.   Memory subjective described as intact.  Attention span & concentration ability appears normal.  Speech is fluent,  without  dysarthria, dysphonia or aphasia.  Mood and affect are appropriate.   Cranial nerves: severe  loss of smell or taste reported  Pupils are equally pin  point- she must have taken opioid medication-  and non-reactive to light. Funduscopic exam deferred.  Extraocular movements in vertical and horizontal planes were intact and without nystagmus. No Diplopia. Visual fields by finger perimetry are intact. Hearing was intact to soft voice and finger rubbing.    Facial sensation intact to fine touch.  Facial motor strength is symmetric and tongue and uvula move midline.  Neck ROM : rotation, tilt and flexion extension were normal for age and shoulder shrug was symmetrical.    Motor exam:  Symmetric bulk, tone - except for the knees- not a normal ROM. she has had bilateral knee replacements. Normal tone Upper extremities. without cog- wheeling, symmetric grip strength .   Sensory:  Fine touch and vibration - hands are numb , bilaterally, carpal tunnel on the right. No vibration sense on either patella.   Proprioception tested in the upper extremities was normal.   Coordination: Rapid alternating movements in the fingers/hands were of normal speed.  The Finger-to-nose maneuver was intact without evidence of ataxia, dysmetria or tremor.   Gait and station: Patient could rise unassisted from a seated position, walked with the  assistive device, a 4 prong cane.  Stance is wider/ base and the patient turned with 4.5 steps  Toe and heel walk were deferred.  Deep tendon reflexes: in the upper  extremities are symmetric and intact.  Babinski response was deferred .     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  She is willing to get it at [facility] today -  She was scared to  have more side effects as she still struggled long term Covid.  She had Ab infusion treatment and steroids/ Dr Chase Caller. She had reportedly AB present when tested this summer.    2) post COVID- headaches have been milder on gabapentin-  when she d/c it she had a severe relapse.     My Plan is to proceed with:  1) Encouraged her to continue her gabapentin , she is not a too high a dose. Her children have scared her about this medication.  I also added stronger medication  prn. I like for her to try meloxicam/ robaxin for tension, neck pain.  2)  "[employer]".  She has done it- and reports some improvement, lemon, onion, mint.   She was still unable to identify orange, vanilla, coconut, coffee , nutmeg and only mint woke her sense of smell. 3) she needs a stronger antidepressant, may be cognitive  behavior therapy.    Since the patient has no dyseasthesias or olfactory hallucinations, I will refrain form Tegretol use.    I would like to thank Tower, Wynelle Fanny, MD and [place], [place], [place],  [place] [address] for allowing me to meet with and to take care of this pleasant patient.   In short, LYRIS HITCHMAN is presenting with long haul COVID impairment - myalgia, fatigue but also headaches and anxiety- to the level of "not wanting to be here any more".  .   I plan to follow up in 6 month through NP>   CC: I will share my notes with PCP.  Electronically signed by: Larey Seat, MD 12/09/2019 11:10 AM  [facility] Board certified by The [employer] of Sleep Medicine and Diplomate of the [employer] of Sleep Medicine. Board certified In Neurology through the [place], Fellow of the [employer] of Neurology. Medical Director of [employer].

## 2019-12-09 NOTE — Patient Instructions (Signed)
What You Should Know About COVID-19 to Protect Yourself and Others Know about COVID-19  Coronavirus (COVID-19) is an illness caused by a virus that can spread from person to person.  The virus that causes COVID-19 is a new coronavirus that has spread throughout the world.  COVID-19 symptoms can range from mild (or no symptoms) to severe illness. Know how COVID-19 is spread  You can become infected by coming into close contact (about 6 feet or two arm lengths) with a person who has COVID-19. COVID-19 is primarily spread from person to person.  You can become infected from respiratory droplets when an infected person coughs, sneezes, or talks.  You may also be able to get it by touching a surface or object that has the virus on it, and then by touching your mouth, nose, or eyes. Protect yourself and others from COVID-19  There is currently no vaccine to protect against COVID-19. The best way to protect yourself is to avoid being exposed to the virus that causes COVID-19.  Stay home as much as possible and avoid close contact with others.  Wear a mask that covers your nose and mouth in public settings.  Clean and disinfect frequently touched surfaces.  Wash your hands often with soap and water for at least 20 seconds, or use an alcohol-based hand sanitizer that contains at least 60% alcohol. Practice social distancing  Buy groceries and medicine, go to the doctor, and complete banking activities online when possible.  If you must go in person, stay at least 6 feet away from others and disinfect items you must touch.  Get deliveries and takeout, and limit in-person contact as much as possible. Prevent the spread of COVID-19 if you are sick  Stay home if you are sick, except to get medical care.  Avoid public transportation, ride-sharing, or taxis.  Separate yourself from other people and pets in your home.  There is no specific treatment for COVID-19, but you can seek medical  care to help relieve your symptoms.  If you need medical attention, call ahead. Know your risk for severe illness  Everyone is at risk of getting COVID-19.  Older adults and people of any age who have serious underlying medical conditions may be at higher risk for more severe illness. [email or web address] 06/18/2018 This information is not intended to replace advice given to you by your health care provider. Make sure you discuss any questions you have with your health care provider. Document Revised: 12/20/2018 Document Reviewed: 12/20/2018 Elsevier Patient Education  [place].

## 2019-12-09 NOTE — Addendum Note (Signed)
Addended by: Darleen Crocker on: 12/09/2019 12:13 PM   Modules accepted: Orders

## 2019-12-10 ENCOUNTER — Ambulatory Visit
Admission: RE | Admit: 2019-12-10 | Discharge: 2019-12-10 | Disposition: A | Discharge status: Home / Self Care | Payer: Medicare Other | Source: Ambulatory Visit | Attending: Neurology | Admitting: Neurology

## 2019-12-10 ENCOUNTER — Other Ambulatory Visit: Payer: Self-pay

## 2019-12-10 DIAGNOSIS — M791 Myalgia, unspecified site: Secondary | ICD-10-CM

## 2019-12-10 DIAGNOSIS — F419 Anxiety disorder, unspecified: Secondary | ICD-10-CM | POA: Diagnosis not present

## 2019-12-10 DIAGNOSIS — F411 Generalized anxiety disorder: Secondary | ICD-10-CM | POA: Diagnosis not present

## 2019-12-10 DIAGNOSIS — R5381 Other malaise: Secondary | ICD-10-CM

## 2019-12-10 DIAGNOSIS — G44229 Chronic tension-type headache, not intractable: Secondary | ICD-10-CM | POA: Diagnosis not present

## 2019-12-10 DIAGNOSIS — U099 Post covid-19 condition, unspecified: Secondary | ICD-10-CM

## 2019-12-16 ENCOUNTER — Telehealth: Payer: Self-pay | Admitting: Neurology

## 2019-12-16 NOTE — Progress Notes (Signed)
IMPRESSION: This MRI of the brain without contrast shows the following: 1.   Remote right MCA territory stroke, unchanged in appearance compared to the CT scan from earlier this year. 2.   Very minimal chronic microvascular ischemic changes. 3.   No acute findings.  No changes compared to the 03/13/2019 CT scan.  INTERPRETING PHYSICIAN:  Richard A. Sater, MD, PhD, Charlynn Grimes  This is a good result, no additional stroke findings and no enlarged area of the previous stroke.

## 2019-12-16 NOTE — Telephone Encounter (Signed)
-----   Message from Larey Seat, MD sent at 12/16/2019 12:19 PM EST ----- IMPRESSION: This MRI of the brain without contrast shows the following: 1.   Remote right MCA territory stroke, unchanged in appearance compared to the CT scan from earlier this year. 2.   Very minimal chronic microvascular ischemic changes. 3.   No acute findings.  No changes compared to the 03/13/2019 CT scan.  INTERPRETING PHYSICIAN:  Richard A. Sater, MD, PhD, Charlynn Grimes  This is a good result, no additional stroke findings and no enlarged area of the previous stroke.

## 2019-12-16 NOTE — Telephone Encounter (Signed)
Called the pt to advise of the MRI findings. There was no answer. LVM advising the patient MRI was not changed from previous scan completed in feb 2021. Advised there was no evidence of new stroke. Instructed the pt if she had questions to call the office.

## 2019-12-17 DIAGNOSIS — Z23 Encounter for immunization: Secondary | ICD-10-CM | POA: Diagnosis not present

## 2020-01-07 ENCOUNTER — Other Ambulatory Visit: Payer: Self-pay | Admitting: Family Medicine

## 2020-01-07 DIAGNOSIS — Z23 Encounter for immunization: Secondary | ICD-10-CM | POA: Diagnosis not present

## 2020-01-10 ENCOUNTER — Other Ambulatory Visit: Payer: Self-pay | Admitting: Cardiovascular Disease

## 2020-01-17 DIAGNOSIS — H353132 Nonexudative age-related macular degeneration, bilateral, intermediate dry stage: Secondary | ICD-10-CM | POA: Diagnosis not present

## 2020-01-27 ENCOUNTER — Other Ambulatory Visit: Payer: Self-pay

## 2020-01-27 ENCOUNTER — Encounter: Payer: Self-pay | Admitting: Cardiovascular Disease

## 2020-01-27 ENCOUNTER — Ambulatory Visit (INDEPENDENT_AMBULATORY_CARE_PROVIDER_SITE_OTHER): Payer: Medicare Other | Admitting: Cardiovascular Disease

## 2020-01-27 ENCOUNTER — Ambulatory Visit (HOSPITAL_COMMUNITY): Payer: Medicare Other | Attending: Internal Medicine

## 2020-01-27 VITALS — BP 124/76 | HR 68 | Ht 65.0 in | Wt 202.4 lb

## 2020-01-27 DIAGNOSIS — I5032 Chronic diastolic (congestive) heart failure: Secondary | ICD-10-CM | POA: Diagnosis not present

## 2020-01-27 DIAGNOSIS — R0602 Shortness of breath: Secondary | ICD-10-CM | POA: Insufficient documentation

## 2020-01-27 DIAGNOSIS — E782 Mixed hyperlipidemia: Secondary | ICD-10-CM

## 2020-01-27 DIAGNOSIS — I700 Atherosclerosis of aorta: Secondary | ICD-10-CM

## 2020-01-27 LAB — ECHOCARDIOGRAM COMPLETE
Area-P 1/2: 3.89 cm2
Height: 65 in
S' Lateral: 2.1 cm
Weight: 3238.4 oz

## 2020-01-27 NOTE — Progress Notes (Signed)
Cardiology Office Note:    Date:  01/27/2020   ID:  [name] [name], DOB 1934/09/04, MRN [id number]  PCP:  Abner Greenspan, MD  [place] Cardiologist:  Sherren Mocha, MD  [place] Electrophysiologist:  None   Referring MD: Abner Greenspan, MD   Chief Complaint  Patient presents with  . Shortness of Breath    History of Present Illness:    [name] [name] is a 86 y.o. female with a hx of:  Palpitations   Hx of TIA  Chronic shortness of breath  ? Myoview 02/2018: low risk  ? Echocardiogram 02/2018: EF 60-65, Gr 2 DD  Heart failure with preserved ejection fraction   Coronary Ca2+ on Chest CT  Aortic atherosclerosis   Hyperlipidemia   DJD   Anxiety   GERD   Macular degeneration   Osteoporosis   Hx of COVID-19 in 12/2018  The patient is here with her daughter today.  She continues to struggle with shortness of breath.  She has lost a lot of function because of her left hip.  The patient is in a wheelchair today.  She has difficulty ambulating.  She denies orthopnea, PND, chest pain, or chest pressure.  She denies lightheadedness or syncope.   Past Medical History:  Diagnosis Date  . Alternating constipation and diarrhea   . Anxiety state, unspecified   . Complication of anesthesia    slow to wake up with surgery several years ago   . COVID-19 virus infection 12/2018  . Depressive disorder, not elsewhere classified   . Diaphragmatic hernia without mention of obstruction or gangrene   . Difficulty sleeping   . Diverticulosis of colon (without mention of hemorrhage)   . Esophageal reflux   . External hemorrhoids without mention of complication   . Fatigue   . Frequency of urination   . Helicobacter pylori (H. pylori)   . Hiatal hernia   . History of transfusion   . Lumbar spondylosis   . Macular degeneration (senile) of retina, unspecified   . Osteoarthrosis, unspecified whether generalized or localized, unspecified site 08-11-11   hx.  "rhematoid arthritis", osteoarthritis, DDD, bursitis(hip)  . Osteoporosis   . Palpitations   . PONV (postoperative nausea and vomiting)   . Schatzki's ring   . Shortness of breath 08-11-11   with exertion only at present  . Unspecified cerebral artery occlusion with cerebral infarction 08-11-11   '97/ '06( TIA)-affected lt. side, slight weakness L side  . Unspecified vitamin D deficiency     Past Surgical History:  Procedure Laterality Date  . ABDOMINAL HYSTERECTOMY  08-11-11  . APPENDECTOMY    . BACK SURGERY  08-11-11   '11-hx. lumbar fusion with retained hardware  . CATARACT EXTRACTION  08-11-11   Bilateral  . ESOPHAGEAL DILATION    . KNEE SURGERY  08-11-11   rt. knee scope  . PATELLAR TENDON REPAIR Left 02/22/2014   Procedure: PATELLA TENDON REPAIR;  Surgeon: Gearlean Alf, MD;  Location: [facility];  Service: Orthopedics;  Laterality: Left;  . PATELLAR TENDON REPAIR Left 06/25/2014   Procedure: LEFT PATELLA TENDON REPAIR;  Surgeon: Gaynelle Arabian, MD;  Location: [facility];  Service: Orthopedics;  Laterality: Left;  . TOTAL KNEE ARTHROPLASTY  08/16/2011   Procedure: TOTAL KNEE ARTHROPLASTY;  Surgeon: Gearlean Alf, MD;  Location: [facility];  Service: Orthopedics;  Laterality: Right;  . TOTAL KNEE ARTHROPLASTY Left 01/27/2014   Procedure: LEFT TOTAL KNEE ARTHROPLASTY;  Surgeon: Gearlean Alf, MD;  Location: [facility];  Service: Orthopedics;  Laterality: Left;  . TUBAL LIGATION      Current Medications: Current Meds  Medication Sig  . acetaminophen (TYLENOL) 325 MG tablet Take 2 tablets (650 mg total) by mouth every 6 (six) hours as needed for mild pain (or Fever >/= 101).  Marland Kitchen ALPRAZolam (XANAX) 0.5 MG tablet TAKE 1 TABLET AT BEDTIME AS NEEDED FOR SLEEP  . atorvastatin (LIPITOR) 10 MG tablet Take 1 tablet (10 mg total) by mouth daily.  . calcium carbonate (TUMS - DOSED IN MG ELEMENTAL CALCIUM) 500 MG chewable tablet Chew 1 tablet (200 mg of elemental calcium total) by mouth 4 (four) times daily  as needed for indigestion or heartburn.  Marland Kitchen CALCIUM-MAGNESIUM-ZINC PO Take 1 capsule by mouth daily.  . clopidogrel (PLAVIX) 75 MG tablet Take 1 tablet (75 mg total) by mouth daily.  . diclofenac sodium (VOLTAREN) 1 % GEL Apply 1 application topically 2 (two) times daily.  Marland Kitchen esomeprazole (NEXIUM) 40 MG capsule TAKE 1 CAPSULE TWICE A DAY BEFORE MEALS  . fluticasone (FLONASE) 50 MCG/ACT nasal spray Place 1-2 sprays into both nostrils daily as needed for allergies or rhinitis.  . furosemide (LASIX) 20 MG tablet TAKE 1 TABLET DAILY  . gabapentin (NEURONTIN) 100 MG capsule Take 2 capsules (200 mg total) by mouth 3 (three) times daily.  Marland Kitchen HYDROcodone-acetaminophen (NORCO) 10-325 MG tablet Take 1 tablet by mouth every 6 (six) hours as needed.   . meloxicam (MOBIC) 7.5 MG tablet Take 1 tablet (7.5 mg total) by mouth daily.  . metoprolol succinate (TOPROL-XL) 25 MG 24 hr tablet TAKE ONE-HALF (1/2) TABLET BY MOUTH AT BEDTIME  . Multiple Vitamins-Minerals (PRESERVISION AREDS PO) Take 1 capsule by mouth 2 (two) times daily.  Vladimir Faster Glycol-Propyl Glycol (SYSTANE OP) Apply 1-2 drops to eye 3 (three) times daily as needed (dry eyes.).   Marland Kitchen sertraline (ZOLOFT) 100 MG tablet Take 2 tablets (200 mg total) by mouth daily.     Allergies:   Alendronate sodium, Alendronate sodium, Celecoxib, Omeprazole, Protonix [pantoprazole sodium], Rabeprazole, and Latex   Social History   Socioeconomic History  . Marital status: Legally Separated    Spouse name: Not on file  . Number of children: 3  . Years of education: Not on file  . Highest education level: Not on file  Occupational History  . Occupation: disabled    Employer: RETIRED  Tobacco Use  . Smoking status: Never Smoker  . Smokeless tobacco: Never Used  Vaping Use  . Vaping Use: Never used  Substance and Sexual Activity  . Alcohol use: No    Alcohol/week: 0.0 standard drinks  . Drug use: No  . Sexual activity: Never  Other Topics Concern  . Not  on file  Social History Narrative  . Not on file   Social Determinants of Health   Financial Resource Strain: Not on file  Food Insecurity: Not on file  Transportation Needs: Not on file  Physical Activity: Not on file  Stress: Not on file  Social Connections: Not on file     Family History: The patient's family history includes Coronary artery disease in her brother; Diabetes in her brother and sister; Heart disease in her brother and father; Pancreatic cancer in her daughter. There is no history of Colon cancer, Colon polyps, Esophageal cancer, or Gallbladder disease.  ROS:   Please see the history of present illness.    All other systems reviewed and are negative.  EKGs/Labs/Other Studies Reviewed:  The following studies were reviewed today: Stress Test 02/23/2018: Study Highlights   Nuclear stress EF: 69%.  Normal perfusion  The study is normal.  This is a low risk study.  Echo 02/23/2018: IMPRESSIONS    1. The left ventricle has normal systolic function of 123456. The cavity  size was normal. There is mildly increased left ventricular wall  thickness. Echo evidence of pseudonormalization in diastolic relaxation.  2. The right ventricle has normal systolic function. The cavity was  normal . There is no increase in right ventricular wall thickness. Right  ventricular systolic pressure is mildly elevated with an estimated  pressure of 33.5 mmHg.  3. The tricuspid valve was normal in structure.  4. The aortic valve is tricuspid There is mild calcification of the  aortic valve. Aortic valve regurgitation is trivial by color flow Doppler.  5. The pulmonic valve was normal in structure.  6. Normal LV systolic function; moderate diastolic dysfunction; mild LVH;  mild TR; mild pulmonary hypertension.   EKG:  EKG is not ordered today.   Recent Labs: 04/29/2019: ALT 14; BUN 18; Creatinine, Ser 0.85; Hemoglobin 13.9; Platelets 245.0; Potassium 4.1; Sodium 140; TSH  2.59  Recent Lipid Panel    Component Value Date/Time   CHOL 167 04/29/2019 1220   TRIG 96.0 04/29/2019 1220   HDL 69.90 04/29/2019 1220   CHOLHDL 2 04/29/2019 1220   VLDL 19.2 04/29/2019 1220   LDLCALC 78 04/29/2019 1220     Risk Assessment/Calculations:       Physical Exam:    VS:  BP 124/76   Pulse 68   Ht 5\' 5"  (1.651 m)   Wt 202 lb 6.4 oz (91.8 kg)   SpO2 96%   BMI 33.68 kg/m     Wt Readings from Last 3 Encounters:  01/27/20 202 lb 6.4 oz (91.8 kg)  12/09/19 203 lb (92.1 kg)  11/15/19 202 lb (91.6 kg)     GEN:  Well nourished, well developed elderly woman in no acute distress, in wheelchair HEENT: Normal NECK: No JVD; No carotid bruits LYMPHATICS: No lymphadenopathy CARDIAC: RRR, no murmurs, rubs, gallops RESPIRATORY:  Clear to auscultation without rales, wheezing or rhonchi  ABDOMEN: Soft, non-tender, non-distended MUSCULOSKELETAL:  No edema; No deformity  SKIN: Warm and dry NEUROLOGIC:  Alert and oriented x 3 PSYCHIATRIC:  Normal affect   ASSESSMENT:    1. Shortness of breath   2. Mixed hyperlipidemia   3. Aortic atherosclerosis ([place])   4. Chronic diastolic heart failure (HCC)    PLAN:    In order of problems listed above:  1. Patient with progressive shortness of breath, exam unrevealing, recommend check 2D echocardiogram to rule out pulmonary hypertension or development of LV/RV dysfunction. 2. Treated with atorvastatin.  Last lipids with a total cholesterol 167, HDL 70, LDL 78.  ALT was 14. 3. Treated with clopidogrel and a statin drug for risk reduction. 4. Appears euvolemic on exam.  Treated with furosemide 20 mg daily.        Medication Adjustments/Labs and Tests Ordered: Current medicines are reviewed at length with the patient today.  Concerns regarding medicines are outlined above.  Orders Placed This Encounter  Procedures  . ECHOCARDIOGRAM COMPLETE   No orders of the defined types were placed in this encounter.   Patient  Instructions  Medication Instructions:  Your provider recommends that you continue on your current medications as directed. Please refer to the Current Medication list given to you today.   *If you need  a refill on your cardiac medications before your next appointment, please call your pharmacy*  Testing/Procedures: Your provider has requested that you have an echocardiogram. Echocardiography is a painless test that uses sound waves to create images of your heart. It provides your doctor with information about the size and shape of your heart and how well your heart's chambers and valves are working. This procedure takes approximately one hour. There are no restrictions for this procedure.     Follow-Up: At [facility], you and your health needs are our priority.  As part of our continuing mission to provide you with exceptional heart care, we have created designated Provider Care Teams.  These Care Teams include your primary Cardiologist (physician) and Advanced Practice Providers (APPs -  Physician Assistants and Nurse Practitioners) who all work together to provide you with the care you need, when you need it. Your next appointment:   12 month(s) The format for your next appointment:   In Person Provider:   You may see Sherren Mocha, MD or one of the following Advanced Practice Providers on your designated Care Team:    [name] Dopp, PA-C  Robbie Lis, [place]      Signed, Sherren Mocha, MD  01/27/2020 12:07 PM    [place]

## 2020-01-27 NOTE — Patient Instructions (Signed)
Medication Instructions:  Your provider recommends that you continue on your current medications as directed. Please refer to the Current Medication list given to you today.   *If you need a refill on your cardiac medications before your next appointment, please call your pharmacy*  Testing/Procedures: Your provider has requested that you have an echocardiogram. Echocardiography is a painless test that uses sound waves to create images of your heart. It provides your doctor with information about the size and shape of your heart and how well your heart's chambers and valves are working. This procedure takes approximately one hour. There are no restrictions for this procedure.      Follow-Up: At [facility], you and your health needs are our priority.  As part of our continuing mission to provide you with exceptional heart care, we have created designated Provider Care Teams.  These Care Teams include your primary Cardiologist (physician) and Advanced Practice Providers (APPs -  Physician Assistants and Nurse Practitioners) who all work together to provide you with the care you need, when you need it. Your next appointment:   12 month(s) The format for your next appointment:   In Person Provider:   You may see Michael Cooper, MD or one of the following Advanced Practice Providers on your designated Care Team:    Scott Weaver, PA-C  Vin Bhagat, PA-C   

## 2020-02-11 DIAGNOSIS — M25552 Pain in left hip: Secondary | ICD-10-CM | POA: Diagnosis not present

## 2020-02-16 DIAGNOSIS — H353132 Nonexudative age-related macular degeneration, bilateral, intermediate dry stage: Secondary | ICD-10-CM | POA: Diagnosis not present

## 2020-02-19 DIAGNOSIS — M25552 Pain in left hip: Secondary | ICD-10-CM | POA: Diagnosis not present

## 2020-03-17 DIAGNOSIS — H353132 Nonexudative age-related macular degeneration, bilateral, intermediate dry stage: Secondary | ICD-10-CM | POA: Diagnosis not present

## 2020-03-19 ENCOUNTER — Ambulatory Visit (INDEPENDENT_AMBULATORY_CARE_PROVIDER_SITE_OTHER): Payer: Medicare Other | Admitting: Family Medicine

## 2020-03-19 ENCOUNTER — Telehealth: Payer: Self-pay | Admitting: Family Medicine

## 2020-03-19 ENCOUNTER — Other Ambulatory Visit: Payer: Self-pay

## 2020-03-19 ENCOUNTER — Other Ambulatory Visit: Payer: Self-pay | Admitting: Family Medicine

## 2020-03-19 ENCOUNTER — Encounter: Payer: Self-pay | Admitting: Family Medicine

## 2020-03-19 ENCOUNTER — Telehealth: Payer: Self-pay | Admitting: Gastroenterology

## 2020-03-19 VITALS — BP 134/72 | HR 70 | Temp 96.9°F | Wt 203.1 lb

## 2020-03-19 DIAGNOSIS — E782 Mixed hyperlipidemia: Secondary | ICD-10-CM

## 2020-03-19 DIAGNOSIS — K219 Gastro-esophageal reflux disease without esophagitis: Secondary | ICD-10-CM

## 2020-03-19 DIAGNOSIS — R5382 Chronic fatigue, unspecified: Secondary | ICD-10-CM | POA: Diagnosis not present

## 2020-03-19 DIAGNOSIS — E559 Vitamin D deficiency, unspecified: Secondary | ICD-10-CM

## 2020-03-19 DIAGNOSIS — R1319 Other dysphagia: Secondary | ICD-10-CM

## 2020-03-19 DIAGNOSIS — R7303 Prediabetes: Secondary | ICD-10-CM | POA: Diagnosis not present

## 2020-03-19 DIAGNOSIS — F411 Generalized anxiety disorder: Secondary | ICD-10-CM

## 2020-03-19 LAB — CBC WITH DIFFERENTIAL/PLATELET
Basophils Absolute: 0 10*3/uL (ref 0.0–0.1)
Basophils Relative: 0.7 % (ref 0.0–3.0)
Eosinophils Absolute: 0.4 10*3/uL (ref 0.0–0.7)
Eosinophils Relative: 6.7 % — ABNORMAL HIGH (ref 0.0–5.0)
HCT: 40.7 % (ref 36.0–46.0)
Hemoglobin: 13.7 g/dL (ref 12.0–15.0)
Lymphocytes Relative: 24.6 % (ref 12.0–46.0)
Lymphs Abs: 1.6 10*3/uL (ref 0.7–4.0)
MCHC: 33.6 g/dL (ref 30.0–36.0)
MCV: 86.9 fl (ref 78.0–100.0)
Monocytes Absolute: 0.6 10*3/uL (ref 0.1–1.0)
Monocytes Relative: 9.5 % (ref 3.0–12.0)
Neutro Abs: 3.7 10*3/uL (ref 1.4–7.7)
Neutrophils Relative %: 58.5 % (ref 43.0–77.0)
Platelets: 223 10*3/uL (ref 150.0–400.0)
RBC: 4.68 Mil/uL (ref 3.87–5.11)
RDW: 15.6 % — ABNORMAL HIGH (ref 11.5–15.5)
WBC: 6.3 10*3/uL (ref 4.0–10.5)

## 2020-03-19 LAB — COMPREHENSIVE METABOLIC PANEL
ALT: 18 U/L (ref 0–35)
AST: 19 U/L (ref 0–37)
Albumin: 4 g/dL (ref 3.5–5.2)
Alkaline Phosphatase: 81 U/L (ref 39–117)
BUN: 16 mg/dL (ref 6–23)
CO2: 34 mEq/L — ABNORMAL HIGH (ref 19–32)
Calcium: 9.3 mg/dL (ref 8.4–10.5)
Chloride: 102 mEq/L (ref 96–112)
Creatinine, Ser: 0.76 mg/dL (ref 0.40–1.20)
GFR: 72.59 mL/min (ref >60.00)
Glucose, Bld: 94 mg/dL (ref 70–99)
Potassium: 4.4 mEq/L (ref 3.5–5.1)
Sodium: 142 mEq/L (ref 135–145)
Total Bilirubin: 0.5 mg/dL (ref 0.2–1.2)
Total Protein: 6.7 g/dL (ref 6.0–8.3)

## 2020-03-19 LAB — LIPID PANEL
Cholesterol: 163 mg/dL (ref 0–200)
HDL: 65.2 mg/dL (ref >39.00)
LDL Cholesterol: 73 mg/dL (ref 0–99)
NonHDL: 97.86
Total CHOL/HDL Ratio: 3
Triglycerides: 123 mg/dL (ref 0.0–149.0)
VLDL: 24.6 mg/dL (ref 0.0–40.0)

## 2020-03-19 LAB — VITAMIN D 25 HYDROXY (VIT D DEFICIENCY, FRACTURES): VITD: 42.55 ng/mL (ref 30.00–100.00)

## 2020-03-19 LAB — HEMOGLOBIN A1C: Hgb A1c MFr Bld: 6.1 % (ref 4.6–6.5)

## 2020-03-19 LAB — TSH: TSH: 2.17 u[IU]/mL (ref 0.35–4.50)

## 2020-03-19 NOTE — Telephone Encounter (Signed)
Pt called in returning phone call for referral

## 2020-03-19 NOTE — Assessment & Plan Note (Signed)
With GERD Past esoph stricture in 2016, EGD report reviewed (Dr Ardis Hughs)  Suspect that she may have another stricture (also known HH) Ref made for GI eval  Enc to continue nexium 40 mg bid  Update in meantime if symptoms worsen

## 2020-03-19 NOTE — Assessment & Plan Note (Signed)
Worse lately with dysphagia  Suspect possible esoph stricture  GI ref made  Continue nexium

## 2020-03-19 NOTE — Progress Notes (Signed)
Subjective:    Patient ID: [name] [name], female    DOB: 1934/12/12, 86 y.o.   MRN: [id number]  This visit occurred during the SARS-CoV-2 public health emergency.  Safety protocols were in place, including screening questions prior to the visit, additional usage of staff PPE, and extensive cleaning of exam room while observing appropriate contact time as indicated for disinfecting solutions.    HPI  This visit occurred during the SARS-CoV-2 public health emergency.  Safety protocols were in place, including screening questions prior to the visit, additional usage of staff PPE, and extensive cleaning of exam room while observing appropriate contact time as indicated for disinfecting solutions.    HPI Pt presents for GERD and swallowing issues   Wt Readings from Last 3 Encounters:  03/19/20 203 lb 2 oz (92.1 kg)  01/27/20 202 lb 6.4 oz (91.8 kg)  12/09/19 203 lb (92.1 kg)   33.80 kg/m  Pt has nausea and swallowing issues (feels like knot in throat) Gets strangled on anything at all  Headaches    Takes nexium 40 mg bid   Last EGD was 3/16 with schatzki's ring and esoph dilatation done 4 cm HH also  Dr Ardis Hughs   Has post covid headaches Sees neurology Takes gabapentin  Seen in November and 6 mo f/u recommended (has appt in may)   More depressed and irritable  Feels like she cannot control things  Health problems are tedious and she has to live with her son Has been on zoloft for a long time  Declines counseling ref  Patient Active Problem List   Diagnosis Date Noted  . Myalgia 12/09/2019  . 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 Chronic tension-type headache, not intractable 09/06/2019  . Ageusia 09/06/2019  . Anosmia 09/06/2019  . History of COVID-19 07/30/2019  . Olfactory impairment 07/30/2019  . Trigeminal neuralgia 03/15/2019  . Mobility impaired 12/11/2018  . Poor balance 08/13/2018  . Fall in home 08/13/2018  . Memory loss  06/27/2018  . Physical deconditioning 03/19/2018  . Chronic diastolic heart failure ([place]) 02/09/2018  . Prediabetes 01/26/2017  . Hearing loss 01/19/2016  . Carpal tunnel syndrome 08/31/2015  . Encounter for Medicare annual wellness exam 06/04/2014  . Obesity 06/04/2014  . Estrogen deficiency 06/04/2014  . Colon cancer screening 06/04/2014  . Schatzki's ring 03/27/2014  . Dysphagia 10/17/2011  . OA (osteoarthritis) of knee 08/16/2011  . Hemorrhoids 08/10/2011  . Other dyspnea and respiratory abnormality 06/21/2010  . Urinary incontinence 05/27/2010  . OVERACTIVE BLADDER 03/12/2010  . Lumbar degenerative disc disease 04/13/2009  . GERD 10/15/2008  . CEREBROVASCULAR ACCIDENT, HX OF 10/15/2008  . Arthropathy, multiple sites 06/26/2008  . EXTERNAL HEMORRHOIDS 02/01/2008  . HIATAL HERNIA 02/01/2008  . DIVERTICULOSIS OF COLON 02/01/2008  . GASTRITIS, HX OF 02/01/2008  . DYSPNEA 09/19/2007  . Vitamin D deficiency 07/03/2007  . Osteoporosis of lumbar spine 06/13/2007  . Fatigue 06/13/2007  . HELICOBACTER PYLORI INFECTION 06/06/2006  . Hyperlipidemia 06/06/2006  . Generalized anxiety disorder 06/06/2006  . Adjustment disorder with mixed anxiety and depressed mood 06/06/2006  . MACULAR DEGENERATION 06/06/2006  . H/O: CVA (cerebrovascular accident) 06/06/2006  . OSTEOARTHRITIS 06/06/2006   Past Medical History:  Diagnosis Date  . Alternating constipation and diarrhea   . Anxiety state, unspecified   . Complication of anesthesia    slow to wake up with surgery several years ago   . COVID-19 virus infection 12/2018  . Depressive disorder, not elsewhere classified   .  Diaphragmatic hernia without mention of obstruction or gangrene   . Difficulty sleeping   . Diverticulosis of colon (without mention of hemorrhage)   . Esophageal reflux   . External hemorrhoids without mention of complication   . Fatigue   . Frequency of urination   . Helicobacter pylori (H. pylori)   . Hiatal  hernia   . History of transfusion   . Lumbar spondylosis   . Macular degeneration (senile) of retina, unspecified   . Osteoarthrosis, unspecified whether generalized or localized, unspecified site 08-11-11   hx. "rhematoid arthritis", osteoarthritis, DDD, bursitis(hip)  . Osteoporosis   . Palpitations   . PONV (postoperative nausea and vomiting)   . Schatzki's ring   . Shortness of breath 08-11-11   with exertion only at present  . Unspecified cerebral artery occlusion with cerebral infarction 08-11-11   '97/ '06( TIA)-affected lt. side, slight weakness L side  . Unspecified vitamin D deficiency    Past Surgical History:  Procedure Laterality Date  . ABDOMINAL HYSTERECTOMY  08-11-11  . APPENDECTOMY    . BACK SURGERY  08-11-11   '11-hx. lumbar fusion with retained hardware  . CATARACT EXTRACTION  08-11-11   Bilateral  . ESOPHAGEAL DILATION    . KNEE SURGERY  08-11-11   rt. knee scope  . PATELLAR TENDON REPAIR Left 02/22/2014   Procedure: PATELLA TENDON REPAIR;  Surgeon: Gearlean Alf, MD;  Location: [facility];  Service: Orthopedics;  Laterality: Left;  . PATELLAR TENDON REPAIR Left 06/25/2014   Procedure: LEFT PATELLA TENDON REPAIR;  Surgeon: Gaynelle Arabian, MD;  Location: [facility];  Service: Orthopedics;  Laterality: Left;  . TOTAL KNEE ARTHROPLASTY  08/16/2011   Procedure: TOTAL KNEE ARTHROPLASTY;  Surgeon: Gearlean Alf, MD;  Location: [facility];  Service: Orthopedics;  Laterality: Right;  . TOTAL KNEE ARTHROPLASTY Left 01/27/2014   Procedure: LEFT TOTAL KNEE ARTHROPLASTY;  Surgeon: Gearlean Alf, MD;  Location: [facility];  Service: Orthopedics;  Laterality: Left;  . TUBAL LIGATION     Social History   Tobacco Use  . Smoking status: Never Smoker  . Smokeless tobacco: Never Used  Vaping Use  . Vaping Use: Never used  Substance Use Topics  . Alcohol use: No    Alcohol/week: 0.0 standard drinks  . Drug use: No   Family History  Problem Relation Age of Onset  . Heart disease Father   .  Coronary artery disease Brother   . Diabetes Brother   . Heart disease Brother   . Pancreatic cancer Daughter   . Diabetes Sister   . Colon cancer Neg Hx   . Colon polyps Neg Hx   . Esophageal cancer Neg Hx   . Gallbladder disease Neg Hx    Allergies  Allergen Reactions  . Alendronate Sodium     REACTION: JAW PAIN  . Alendronate Sodium Other (See Comments)    REACTION: JAW PAIN  . Celecoxib Other (See Comments)    REACTION: GI UPSET REACTION: GI UPSET  . Omeprazole Nausea Only  . Protonix [Pantoprazole Sodium] Nausea And Vomiting  . Rabeprazole Nausea And Vomiting  . Latex Itching and Rash   Current Outpatient Medications on File Prior to Visit  Medication Sig Dispense Refill  . acetaminophen (TYLENOL) 325 MG tablet Take 2 tablets (650 mg total) by mouth every 6 (six) hours as needed for mild pain (or Fever >/= 101). 40 tablet 0  . ALPRAZolam (XANAX) 0.5 MG tablet TAKE 1 TABLET AT BEDTIME AS NEEDED FOR  SLEEP 90 tablet 0  . atorvastatin (LIPITOR) 10 MG tablet Take 1 tablet (10 mg total) by mouth daily. 90 tablet 3  . calcium carbonate (TUMS - DOSED IN MG ELEMENTAL CALCIUM) 500 MG chewable tablet Chew 1 tablet (200 mg of elemental calcium total) by mouth 4 (four) times daily as needed for indigestion or heartburn. 10 tablet 0  . CALCIUM-MAGNESIUM-ZINC PO Take 1 capsule by mouth daily.    . diclofenac sodium (VOLTAREN) 1 % GEL Apply 1 application topically 2 (two) times daily.    Marland Kitchen esomeprazole (NEXIUM) 40 MG capsule TAKE 1 CAPSULE TWICE A DAY BEFORE MEALS 180 capsule 3  . furosemide (LASIX) 20 MG tablet TAKE 1 TABLET DAILY 90 tablet 2  . gabapentin (NEURONTIN) 100 MG capsule Take 2 capsules (200 mg total) by mouth 3 (three) times daily. 540 capsule 0  . HYDROcodone-acetaminophen (NORCO) 10-325 MG tablet Take 1 tablet by mouth every 6 (six) hours as needed.   0  . metoprolol succinate (TOPROL-XL) 25 MG 24 hr tablet TAKE ONE-HALF (1/2) TABLET BY MOUTH AT BEDTIME 45 tablet 3  .  Multiple Vitamins-Minerals (PRESERVISION AREDS PO) Take 1 capsule by mouth 2 (two) times daily.    Vladimir Faster Glycol-Propyl Glycol (SYSTANE OP) Apply 1-2 drops to eye 3 (three) times daily as needed (dry eyes.).     Marland Kitchen sertraline (ZOLOFT) 100 MG tablet Take 2 tablets (200 mg total) by mouth daily. 180 tablet 3   No current facility-administered medications on file prior to visit.    Review of Systems  Constitutional: Positive for fatigue. Negative for activity change, appetite change, fever and unexpected weight change.  HENT: Positive for trouble swallowing. Negative for congestion, ear pain, rhinorrhea, sinus pressure and sore throat.   Eyes: Negative for pain, redness and visual disturbance.  Respiratory: Negative for cough, shortness of breath and wheezing.   Cardiovascular: Negative for chest pain and palpitations.  Gastrointestinal: Positive for diarrhea and nausea. Negative for abdominal pain, blood in stool, constipation and vomiting.  Endocrine: Negative for polydipsia and polyuria.  Genitourinary: Negative for dysuria, frequency and urgency.  Musculoskeletal: Positive for arthralgias, back pain and gait problem. Negative for joint swelling and myalgias.       L hip bursitis    Skin: Negative for pallor and rash.  Allergic/Immunologic: Negative for environmental allergies.  Neurological: Negative for dizziness, syncope and headaches.  Hematological: Negative for adenopathy. Does not bruise/bleed easily.  Psychiatric/Behavioral: Negative for decreased concentration and dysphoric mood. The patient is not nervous/anxious.        Objective:   Physical Exam Constitutional:      General: She is not in acute distress.    Appearance: Normal appearance. She is well-developed and well-nourished. She is obese. She is not ill-appearing.     Comments: Pt in wheelchair  HENT:     Head: Normocephalic and atraumatic.     Mouth/Throat:     Mouth: Oropharynx is clear and moist. Mucous  membranes are moist.  Eyes:     General: No scleral icterus.    Extraocular Movements: EOM normal.     Conjunctiva/sclera: Conjunctivae normal.     Pupils: Pupils are equal, round, and reactive to light.  Cardiovascular:     Rate and Rhythm: Normal rate and regular rhythm.     Heart sounds: Normal heart sounds.  Pulmonary:     Effort: Pulmonary effort is normal. No respiratory distress.     Breath sounds: Normal breath sounds. No wheezing or rales.  Abdominal:     General: Abdomen is protuberant. Bowel sounds are normal. There is no distension.     Palpations: Abdomen is soft. There is no mass or pulsatile mass.     Tenderness: There is no abdominal tenderness. There is no right CVA tenderness, left CVA tenderness, guarding or rebound. Negative signs include Murphy's sign.     Hernia: No hernia is present.  Musculoskeletal:     Cervical back: Normal range of motion and neck supple. No tenderness.     Comments: Tender over L greater trochanter  Lymphadenopathy:     Cervical: No cervical adenopathy.  Skin:    General: Skin is warm and dry.     Coloration: Skin is not pale.     Findings: No erythema.  Neurological:     Mental Status: She is alert.     Sensory: No sensory deficit.     Deep Tendon Reflexes: Reflexes normal.  Psychiatric:        Mood and Affect: Mood is depressed.        Speech: Speech normal.        Cognition and Memory: Cognition normal.           Assessment & Plan:   Problem List Items Addressed This Visit      Digestive   GERD    Worse lately with dysphagia  Suspect possible esoph stricture  GI ref made  Continue nexium       Relevant Orders   Ambulatory referral to Gastroenterology   Dysphagia - Primary    With GERD Past esoph stricture in 2016, EGD report reviewed (Dr Ardis Hughs)  Suspect that she may have another stricture (also known HH) Ref made for GI eval  Enc to continue nexium 40 mg bid  Update in meantime if symptoms worsen       Relevant Orders   Ambulatory referral to Gastroenterology     Other   Vitamin D deficiency   Relevant Orders   VITAMIN D 25 Hydroxy (Vit-D Deficiency, Fractures)   Hyperlipidemia   Relevant Orders   Comprehensive metabolic panel   Lipid panel   Generalized anxiety disorder    With depression  Worse lately in light of current health problems  Taking zoloft high dose  Of note has chronic diarrhea  Stress is worse (health problems)  Reviewed stressors/ coping techniques/symptoms/ support sources/ tx options and side effects in detail today Once GI issues are improved will plan a virtual visit to discuss further She declines counseling at this time No SI      Fatigue    Labs today  Will disc at upcoming annual exam      Relevant Orders   CBC with Differential/Platelet   Comprehensive metabolic panel   TSH   Prediabetes   Relevant Orders   Hemoglobin A1c

## 2020-03-19 NOTE — Telephone Encounter (Signed)
Called patient and her duaghter back and let them know that Patient has been worked in with [facility] tomorrow at 11:30 am.

## 2020-03-19 NOTE — Telephone Encounter (Signed)
Pt informed

## 2020-03-19 NOTE — Patient Instructions (Signed)
Continue your nexium   Let's discuss depression again - schedule a phone visit to discuss that once your GI problems are improved   I placed an urgent referral to GI for your reflux and swallowing    Labs today for your upcoming annual exam

## 2020-03-19 NOTE — Assessment & Plan Note (Signed)
Labs today  Will disc at upcoming annual exam

## 2020-03-19 NOTE — Telephone Encounter (Signed)
I added her to tomorrow at 1130 am with Nicoletta Ba.  Can you please call and let then know? PCP or the pt.  Thank you

## 2020-03-19 NOTE — Assessment & Plan Note (Signed)
With depression  Worse lately in light of current health problems  Taking zoloft high dose  Of note has chronic diarrhea  Stress is worse (health problems)  Reviewed stressors/ coping techniques/symptoms/ support sources/ tx options and side effects in detail today Once GI issues are improved will plan a virtual visit to discuss further She declines counseling at this time No SI

## 2020-03-19 NOTE — Telephone Encounter (Signed)
[name] with PCP office called requesting appt asap for pt. She stated that pt is having trouble swallowing and Dr. Glori Bickers would like pt to be seen asap. Pls call PCP office with any questions at [phone]. Or advise on scheduling. Thank you.

## 2020-03-20 ENCOUNTER — Encounter: Payer: Self-pay | Admitting: Physician Assistant

## 2020-03-20 ENCOUNTER — Ambulatory Visit (INDEPENDENT_AMBULATORY_CARE_PROVIDER_SITE_OTHER): Payer: Medicare Other | Admitting: Physician Assistant

## 2020-03-20 VITALS — BP 106/66 | HR 76 | Ht 63.0 in | Wt 203.0 lb

## 2020-03-20 DIAGNOSIS — K580 Irritable bowel syndrome with diarrhea: Secondary | ICD-10-CM

## 2020-03-20 DIAGNOSIS — R1310 Dysphagia, unspecified: Secondary | ICD-10-CM

## 2020-03-20 DIAGNOSIS — K219 Gastro-esophageal reflux disease without esophagitis: Secondary | ICD-10-CM

## 2020-03-20 MED ORDER — GLYCOPYRROLATE 2 MG PO TABS: 2.0000 mg | ORAL_TABLET | Freq: Every day | ORAL | 2 refills | Status: DC

## 2020-03-20 MED ORDER — FAMOTIDINE 40 MG PO TABS: 40.0000 mg | ORAL_TABLET | Freq: Every day | ORAL | 2 refills | Status: DC

## 2020-03-20 NOTE — Progress Notes (Signed)
Subjective:    Patient ID: [name] [name], female    DOB: 11/01/1934, 86 y.o.   MRN: [id number]  HPI [name] [name] is a pleasant 86 year old white female, known to Dr. Ardis [name] who was last seen here in 2016.  She has history of GERD, and Schatzki's ring and required dilation in March 2016 with TTS balloon dilation. Patient says that she has been having her current symptoms over the past 3 to 4 months.  She reports that prior esophageal dilation definitely helped her for a long time as far she recalls.  She has noticed some recurrence of dysphagia to liquids and solids.  She says she has been having some choking with liquids intermittently, has sort of a constant fullness sensation in her throat and has vague sense of dysphagia with solids.  She also has some heartburn indigestion type symptoms despite being on Nexium 40 mg twice daily.  She reports frequent reflux and intermittent regurgitation of foods after swallowing. Her daughter also reports that she has been having intermittent episodes of urgent loose stools and has had occasions of incontinence.  Patient has started wearing a depends.  She says she may have loose stools and some urgency for a day or 2 then have no bowel movements for a day or 2 then goes back to loose stool.  No ongoing abdominal pain.  She does complain of gassiness. Patient has several comorbidities including history of congestive heart failure with preserved EF, 2 prior CVAs for which she is maintained on Plavix, diverticulosis, and was diagnosed with Covid in December 2020.  She has had several ongoing symptoms consistent with long-haul Covid.  She says she is still having headaches on a regular basis, and weakness.  Review of Systems Pertinent positive and negative review of systems were noted in the above HPI section.  All other review of systems was otherwise negative.  Outpatient Encounter Medications as of 03/20/2020  Medication Sig  . acetaminophen (TYLENOL) 325 MG tablet  Take 2 tablets (650 mg total) by mouth every 6 (six) hours as needed for mild pain (or Fever >/= 101).  Marland Kitchen ALPRAZolam (XANAX) 0.5 MG tablet TAKE 1 TABLET AT BEDTIME AS NEEDED FOR SLEEP  . atorvastatin (LIPITOR) 10 MG tablet Take 1 tablet (10 mg total) by mouth daily.  . calcium carbonate (TUMS - DOSED IN MG ELEMENTAL CALCIUM) 500 MG chewable tablet Chew 1 tablet (200 mg of elemental calcium total) by mouth 4 (four) times daily as needed for indigestion or heartburn.  Marland Kitchen CALCIUM-MAGNESIUM-ZINC PO Take 1 capsule by mouth daily.  . clopidogrel (PLAVIX) 75 MG tablet TAKE 1 TABLET DAILY  . diclofenac sodium (VOLTAREN) 1 % GEL Apply 1 application topically 2 (two) times daily.  Marland Kitchen esomeprazole (NEXIUM) 40 MG capsule TAKE 1 CAPSULE TWICE A DAY BEFORE MEALS  . famotidine (PEPCID) 40 MG tablet Take 1 tablet (40 mg total) by mouth daily before breakfast.  . furosemide (LASIX) 20 MG tablet TAKE 1 TABLET DAILY  . gabapentin (NEURONTIN) 100 MG capsule Take 2 capsules (200 mg total) by mouth 3 (three) times daily.  Marland Kitchen glycopyrrolate (ROBINUL) 2 MG tablet Take 1 tablet (2 mg total) by mouth daily.  Marland Kitchen HYDROcodone-acetaminophen (NORCO) 10-325 MG tablet Take 1 tablet by mouth every 6 (six) hours as needed.   . metoprolol succinate (TOPROL-XL) 25 MG 24 hr tablet TAKE ONE-HALF (1/2) TABLET BY MOUTH AT BEDTIME  . Multiple Vitamins-Minerals (PRESERVISION AREDS PO) Take 1 capsule by mouth 2 (two) times daily.  Marland Kitchen  Polyethyl Glycol-Propyl Glycol (SYSTANE OP) Apply 1-2 drops to eye 3 (three) times daily as needed (dry eyes.).   Marland Kitchen sertraline (ZOLOFT) 100 MG tablet Take 2 tablets (200 mg total) by mouth daily.   No facility-administered encounter medications on file as of 03/20/2020.   Allergies  Allergen Reactions  . Alendronate Sodium     REACTION: JAW PAIN  . Alendronate Sodium Other (See Comments)    REACTION: JAW PAIN  . Celecoxib Other (See Comments)    REACTION: GI UPSET REACTION: GI UPSET  . Omeprazole Nausea Only   . Protonix [Pantoprazole Sodium] Nausea And Vomiting  . Rabeprazole Nausea And Vomiting  . Latex Itching and Rash   Patient Active Problem List   Diagnosis Date Noted  . Myalgia 12/09/2019  . 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 Chronic tension-type headache, not intractable 09/06/2019  . Ageusia 09/06/2019  . Anosmia 09/06/2019  . History of COVID-19 07/30/2019  . Olfactory impairment 07/30/2019  . Trigeminal neuralgia 03/15/2019  . Mobility impaired 12/11/2018  . Poor balance 08/13/2018  . Fall in home 08/13/2018  . Memory loss 06/27/2018  . Physical deconditioning 03/19/2018  . Chronic diastolic heart failure ([place]) 02/09/2018  . Prediabetes 01/26/2017  . Hearing loss 01/19/2016  . Carpal tunnel syndrome 08/31/2015  . Encounter for Medicare annual wellness exam 06/04/2014  . Obesity 06/04/2014  . Estrogen deficiency 06/04/2014  . Colon cancer screening 06/04/2014  . Schatzki's ring 03/27/2014  . Dysphagia 10/17/2011  . OA (osteoarthritis) of knee 08/16/2011  . Hemorrhoids 08/10/2011  . Other dyspnea and respiratory abnormality 06/21/2010  . Urinary incontinence 05/27/2010  . OVERACTIVE BLADDER 03/12/2010  . Lumbar degenerative disc disease 04/13/2009  . GERD 10/15/2008  . CEREBROVASCULAR ACCIDENT, HX OF 10/15/2008  . Arthropathy, multiple sites 06/26/2008  . EXTERNAL HEMORRHOIDS 02/01/2008  . HIATAL HERNIA 02/01/2008  . DIVERTICULOSIS OF COLON 02/01/2008  . GASTRITIS, HX OF 02/01/2008  . DYSPNEA 09/19/2007  . Vitamin D deficiency 07/03/2007  . Osteoporosis of lumbar spine 06/13/2007  . Fatigue 06/13/2007  . HELICOBACTER PYLORI INFECTION 06/06/2006  . Hyperlipidemia 06/06/2006  . Generalized anxiety disorder 06/06/2006  . Adjustment disorder with mixed anxiety and depressed mood 06/06/2006  . MACULAR DEGENERATION 06/06/2006  . H/O: CVA (cerebrovascular accident) 06/06/2006  . OSTEOARTHRITIS 06/06/2006   Social History    Socioeconomic History  . Marital status: Legally Separated    Spouse name: Not on file  . Number of children: 3  . Years of education: Not on file  . Highest education level: Not on file  Occupational History  . Occupation: disabled    Employer: RETIRED  Tobacco Use  . Smoking status: Never Smoker  . Smokeless tobacco: Never Used  Vaping Use  . Vaping Use: Never used  Substance and Sexual Activity  . Alcohol use: No    Alcohol/week: 0.0 standard drinks  . Drug use: No  . Sexual activity: Never  Other Topics Concern  . Not on file  Social History Narrative  . Not on file   Social Determinants of Health   Financial Resource Strain: Not on file  Food Insecurity: Not on file  Transportation Needs: Not on file  Physical Activity: Not on file  Stress: Not on file  Social Connections: Not on file  Intimate Partner Violence: Not on file    Ms. Pfluger's family history includes Coronary artery disease in her brother; Diabetes in her brother and sister; Heart disease in her brother and father; Pancreatic cancer in  her daughter.      Objective:    Vitals:   03/20/20 1120  BP: 106/66  Pulse: 76    Physical Exam Well-developed well-nourished eld WF in no acute distress.  Pleasant,.In a wheelchair- accompanied by daughter  Height, Weight,2013 BMI 35.9  HEENT; nontraumatic normocephalic, EOMI, PE R LA, sclera anicteric. Oropharynx; not examined today Neck; supple, no JVD Cardiovascular; regular rate and rhythm with S1-S2, no murmur rub or gallop Pulmonary; Clear bilaterally Abdomen; soft, obese ,nontender, nondistended, no palpable mass or hepatosplenomegaly, bowel sounds are active Rectal; Skin; benign exam, no jaundice rash or appreciable lesions Extremities; no clubbing cyanosis or edema skin warm and dry Neuro/Psych; alert and oriented x4, grossly nonfocal mood and affect appropriate       Assessment & Plan:   #3 86 year old white female with history of  chronic GERD who presents with increase in symptoms over the past 3 to 4 months with some refractory GERD symptoms despite twice daily Nexium, episodes of regurgitation, some heartburn and vague dysphagia which has happened both with solids and liquids.  I suspect the liquid dysphagia is more oral pharyngeal as she reports coughing and choking. She does have prior history of distal esophageal ring/Schatzki's ring and had dilation in 2016 with improvement in symptoms. Rule out recurrent ring  #2 intermittent urgency and diarrhea/loose stool with incontinence-likely IBS #3 probable long-haul Covid #4 prior history of CVAs-on Plavix  5.  Congestive heart failure with preserved EF 6.  Diverticulosis  Plan; continue Nexium 40 mg p.o. twice daily Add trial of Pepcid 40 mg p.o. every morning as the majority of her symptoms are daytime Tighten up on antireflux regimen We discussed repeat EGD versus barium swallow.  Will proceed with barium swallow as initial study.  If barium swallow positive for recurrent distal esophageal ring, then she can be set up for repeat EGD with balloon dilation with Dr. Ardis [name] which would require her coming off of Plavix.  Start Benefiber 1 scoop daily in a glass of water Start trial of glycopyrrolate 2 mg p.o. every morning for urgency/loose stool episodes. Have asked her to give this regimen to try over the next few weeks and to call for advice if she does not have any improvement in symptoms.    Sylvio Weatherall S Xabi Wittler PA-C 03/20/2020   Cc: Tower, Wynelle Fanny, MD

## 2020-03-20 NOTE — Patient Instructions (Signed)
You will be contacted by [facility] in the next 2 days to arrange a Barium Swallow with tablet.  The number on your caller ID will be [phone], please answer when they call.  If you have not heard from them in 2 days please call [phone] to schedule.    We have sent the following medications to your pharmacy for you to pick up at your convenience: Generic pepcid, glycopyrolate    Continue your Nexium twice daily.   Add over the counter Benefiber, one scoop daily in a glass of water.   We are providing you with GERD information to read.   I appreciate the opportunity to care for you. Amy Esterwood, PA-C

## 2020-03-23 NOTE — Progress Notes (Signed)
I agree with the above note, plan 

## 2020-03-26 DIAGNOSIS — M25552 Pain in left hip: Secondary | ICD-10-CM | POA: Diagnosis not present

## 2020-03-30 ENCOUNTER — Ambulatory Visit (HOSPITAL_COMMUNITY)
Admission: RE | Admit: 2020-03-30 | Discharge: 2020-03-30 | Disposition: A | Discharge status: Home / Self Care | Payer: Medicare Other | Source: Ambulatory Visit | Attending: Physician Assistant | Admitting: Physician Assistant

## 2020-03-30 ENCOUNTER — Other Ambulatory Visit: Payer: Self-pay | Admitting: Physician Assistant

## 2020-03-30 ENCOUNTER — Other Ambulatory Visit: Payer: Self-pay

## 2020-03-30 DIAGNOSIS — R1310 Dysphagia, unspecified: Secondary | ICD-10-CM | POA: Insufficient documentation

## 2020-03-30 DIAGNOSIS — K219 Gastro-esophageal reflux disease without esophagitis: Secondary | ICD-10-CM | POA: Diagnosis not present

## 2020-03-31 ENCOUNTER — Telehealth: Payer: Self-pay | Admitting: Physician Assistant

## 2020-03-31 NOTE — Telephone Encounter (Signed)
Patient notified of the results of the BS

## 2020-03-31 NOTE — Telephone Encounter (Signed)
Pt is requesting a call back regarding her barium results

## 2020-04-06 ENCOUNTER — Telehealth: Payer: Self-pay | Admitting: *Deleted

## 2020-04-06 NOTE — Telephone Encounter (Signed)
Labs look stable overall.  A1c is still in the prediabetes range

## 2020-04-06 NOTE — Telephone Encounter (Signed)
Pt notified of Dr. Tower's comments and verbalized understanding  

## 2020-04-06 NOTE — Telephone Encounter (Signed)
Patient called stating that she is still experiencing diarrhea and nausea. Patient has sent GI a mychart message but has not heard back from them. Patient was advised that she should call GI and advise them of her ongoing GI problems and she verbalized understanding.   Patient wants to know what her lab results were that Dr. Glori Bickers had drawn back a couple of weeks ago.

## 2020-04-07 ENCOUNTER — Other Ambulatory Visit: Payer: Self-pay | Admitting: Family Medicine

## 2020-04-09 ENCOUNTER — Other Ambulatory Visit: Payer: Self-pay

## 2020-04-09 ENCOUNTER — Telehealth: Payer: Self-pay

## 2020-04-09 MED ORDER — ONDANSETRON HCL 4 MG PO TABS: 4.0000 mg | ORAL_TABLET | Freq: Four times a day (QID) | ORAL | 1 refills | Status: DC | PRN

## 2020-04-09 NOTE — Telephone Encounter (Signed)
Called patient and she states she does not use MyChart, that her daughter had sent the message, and she was waiting for a call back. States her diarrhea lasted for 3 days with a coffee ground look. She has also been having reflux & nausea. She is not having diarrhea anymore, her BMs are hard now. She is taking Nexium-40mg  BID and Pepcid-40mg  QAM, with little improvement in her reflux. She took 4 Tums this morning. She still has nausea. I called into her pharmacy Zofran-4mg  PO Q6hrs. PRN and gave  instructions for taking. Gave Amy Esterwood PA recommendations post swallow study;  for eating small bites, chew well and drink plenty of fluids with eating. Also ask her to increase water/fluids to help with the constipation, since she is probably dehydrated from the diarrhea. Asked her to call us after a week or two if these measures do not help

## 2020-04-13 ENCOUNTER — Other Ambulatory Visit: Payer: Self-pay | Admitting: Family Medicine

## 2020-04-16 DIAGNOSIS — H353132 Nonexudative age-related macular degeneration, bilateral, intermediate dry stage: Secondary | ICD-10-CM | POA: Diagnosis not present

## 2020-04-23 DIAGNOSIS — M25552 Pain in left hip: Secondary | ICD-10-CM | POA: Diagnosis not present

## 2020-05-11 ENCOUNTER — Other Ambulatory Visit: Payer: Self-pay

## 2020-05-11 ENCOUNTER — Encounter: Payer: Self-pay | Admitting: Family Medicine

## 2020-05-11 ENCOUNTER — Ambulatory Visit (INDEPENDENT_AMBULATORY_CARE_PROVIDER_SITE_OTHER): Payer: Medicare Other | Admitting: Family Medicine

## 2020-05-11 VITALS — BP 110/62 | HR 73 | Temp 97.0°F | Ht 62.5 in | Wt 193.1 lb

## 2020-05-11 DIAGNOSIS — E782 Mixed hyperlipidemia: Secondary | ICD-10-CM

## 2020-05-11 DIAGNOSIS — R1310 Dysphagia, unspecified: Secondary | ICD-10-CM

## 2020-05-11 DIAGNOSIS — M810 Age-related osteoporosis without current pathological fracture: Secondary | ICD-10-CM | POA: Diagnosis not present

## 2020-05-11 DIAGNOSIS — F411 Generalized anxiety disorder: Secondary | ICD-10-CM

## 2020-05-11 DIAGNOSIS — R7303 Prediabetes: Secondary | ICD-10-CM | POA: Diagnosis not present

## 2020-05-11 DIAGNOSIS — Z0000 Encounter for general adult medical examination without abnormal findings: Secondary | ICD-10-CM

## 2020-05-11 DIAGNOSIS — G500 Trigeminal neuralgia: Secondary | ICD-10-CM | POA: Diagnosis not present

## 2020-05-11 DIAGNOSIS — E559 Vitamin D deficiency, unspecified: Secondary | ICD-10-CM | POA: Diagnosis not present

## 2020-05-11 DIAGNOSIS — I5032 Chronic diastolic (congestive) heart failure: Secondary | ICD-10-CM

## 2020-05-11 MED ORDER — GABAPENTIN 100 MG PO CAPS: 200.0000 mg | ORAL_CAPSULE | Freq: Three times a day (TID) | ORAL | 1 refills | Status: DC

## 2020-05-11 NOTE — Assessment & Plan Note (Signed)
dexa 9/21-mixed trend In osteopenia range  No new falls or fx  D level is tx Exercising

## 2020-05-11 NOTE — Assessment & Plan Note (Signed)
Reviewed health habits including diet and exercise and skin cancer prevention Reviewed appropriate screening tests for age  Also reviewed health mt list, fam hx and immunization status , as well as social and family history   See HPI utd cologuard and mammogram  Due for covid booster after late may  dexa utd with no new falls and D is tx  Reviewed cognitive concerns (short term memory) and no changes today Has hearing aides and utd eye/vision care  Care team reviewed

## 2020-05-11 NOTE — Assessment & Plan Note (Signed)
Lab Results  Component Value Date   HGBA1C 6.1 03/19/2020   disc imp of low glycemic diet and wt loss to prevent DM2

## 2020-05-11 NOTE — Assessment & Plan Note (Addendum)
  Plan to continue zoloft 200 mg daily  Reviewed stressors/ coping techniques/symptoms/ support sources/ tx options and side effects in detail today

## 2020-05-11 NOTE — Assessment & Plan Note (Signed)
Pt continues GERD tx and GI f/u for this

## 2020-05-11 NOTE — Assessment & Plan Note (Signed)
Level 42.5  Vitamin D level is therapeutic with current supplementation Disc importance of this to bone and overall health

## 2020-05-11 NOTE — Assessment & Plan Note (Signed)
Disc goals for lipids and reasons to control them Rev last labs with pt Rev low sat fat diet in detail Plan to continue atorvastatin 10 mg daily  Overall stable

## 2020-05-11 NOTE — Assessment & Plan Note (Signed)
Pt wants to continue gabapentin but does not plan to return to neurology  This was refilled 200 mg tid Disc possible side effect of brain fog /word searching

## 2020-05-11 NOTE — Patient Instructions (Addendum)
You can have a covid booster after the end of may   You are pre diabetic  If you want a meter - check with your insurance and if they do let me know what brand is covered and I will send it in   Take care of yourself   Overall labs are stable   I sent in gabapentin

## 2020-05-11 NOTE — Progress Notes (Addendum)
Subjective:    Patient ID: [name] [name], female    DOB: [birth year], 86 y.o.   MRN: [id number]  This visit occurred during the SARS-CoV-2 public health emergency.  Safety protocols were in place, including screening questions prior to the visit, additional usage of staff PPE, and extensive cleaning of exam room while observing appropriate contact time as indicated for disinfecting solutions.    HPI Pt presents for amw and annual f/u of chronic health problems  I have personally reviewed the Medicare Annual Wellness questionnaire and have noted 1. The patient's medical and social history 2. Their use of alcohol, tobacco or illicit drugs 3. Their current medications and supplements 4. The patient's functional ability including ADL's, fall risks, home safety risks and hearing or visual             impairment. 5. Diet and physical activities 6. Evidence for depression or mood disorders  The patients weight, height, BMI have been recorded in the chart and visual acuity is per eye clinic.  I have made referrals, counseling and provided education to the patient based review of the above and I have provided the pt with a written personalized care plan for preventive services. Reviewed and updated provider list, see scanned forms.  See scanned forms.  Routine anticipatory guidance given to patient.  See health maintenance. Colon cancer screening-negative cologuard test 6/21 Breast cancer screening 9/21 mammogram Self breast exam- no lumps  Flu vaccine 9/20 Tetanus vaccine  09- def for insurance covid status -has had covid with longhauler symptoms  Had vaccines -not booster yet -plans in June  Pneumovax commpleted Zoster vaccine-had shingrix Dexa  9/21-osteopenia mixed trend (intol of alendronate in the past) Falls -none new  Fractures-none  Supplements-vit D D level is tx at 42.55 Exercise -walks with walker and uses 3 lb weights daily and does some leg lifts from PT   Advance  directive-up to date  Cognitive function addressed- see scanned forms- and if abnormal then additional documentation follows.  Watches this closely  Thinks short term memory has worsened  No change in cognitive ability or change or speed  Knows who the president is  Reads all the time and can do her finances  (kindle)     PMH and SH reviewed  Meds, vitals, and allergies reviewed.   ROS: See HPI.  Otherwise negative.    Weight : Wt Readings from Last 3 Encounters:  05/11/20 193 lb 1 oz (87.6 kg)  03/20/20 203 lb (92.1 kg)  03/19/20 203 lb 2 oz (92.1 kg)   34.75 kg/m  Has been working hard on weight loss   (beans and greens and salads) Eating better  Some exercise  Hearing/vision:  Hearing Screening   125Hz  250Hz  500Hz  1000Hz  2000Hz  3000Hz  4000Hz  6000Hz  8000Hz   Right ear:           Left ear:           Comments: Pt wears hearing aids  Vision Screening Comments: Pt had yearly eye exam with Dr. Bing Plume   Care team Analiyah Lechuga-pcp Cooper-cardiol Alusio-ortho Ramaswamy- pulmonary [place]-oph Carloyn Manner- neuro surg  H/o CHF and past cva Sees both pulmonary and cardiology - uses 02 at night  BP Readings from Last 3 Encounters:  05/11/20 110/62  03/20/20 106/66  03/19/20 134/72   Pulse Readings from Last 3 Encounters:  05/11/20 73  03/20/20 76  03/19/20 70   Seeing GI for ongoing issues with GERD and dyphagia and IBS  Mood H/o chronic anxiety  Takes zoloft 100 mg   Hyperlipidemia Lab Results  Component Value Date   CHOL 163 03/19/2020   CHOL 167 04/29/2019   CHOL 187 02/09/2018   Lab Results  Component Value Date   HDL 65.20 03/19/2020   HDL 69.90 04/29/2019   HDL 72.40 02/09/2018   Lab Results  Component Value Date   LDLCALC 73 03/19/2020   LDLCALC 78 04/29/2019   LDLCALC 91 02/09/2018   Lab Results  Component Value Date   TRIG 123.0 03/19/2020   TRIG 96.0 04/29/2019   TRIG 115.0 02/09/2018   Lab Results  Component Value Date   CHOLHDL 3 03/19/2020    CHOLHDL 2 04/29/2019   CHOLHDL 3 02/09/2018   No results found for: LDLDIRECT  Atorvastatin 10 mg daily  Prediabetes Lab Results  Component Value Date   HGBA1C 6.1 03/19/2020   Wants to go back on gabapentin for headaches   Takes ppi   Lab Results  Component Value Date   TSH 2.17 03/19/2020    Lab Results  Component Value Date   WBC 6.3 03/19/2020   HGB 13.7 03/19/2020   HCT 40.7 03/19/2020   MCV 86.9 03/19/2020   PLT 223.0 03/19/2020   Lab Results  Component Value Date   ALT 18 03/19/2020   AST 19 03/19/2020   ALKPHOS 81 03/19/2020   BILITOT 0.5 03/19/2020   Lab Results  Component Value Date   CREATININE 0.76 03/19/2020   BUN 16 03/19/2020   NA 142 03/19/2020   K 4.4 03/19/2020   CL 102 03/19/2020   CO2 34 (H) 03/19/2020   Patient Active Problem List   Diagnosis Date Noted  . Myalgia 12/09/2019  . 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 Chronic tension-type headache, not intractable 09/06/2019  . Ageusia 09/06/2019  . Anosmia 09/06/2019  . History of COVID-19 07/30/2019  . Olfactory impairment 07/30/2019  . Trigeminal neuralgia 03/15/2019  . Mobility impaired 12/11/2018  . Poor balance 08/13/2018  . Memory loss 06/27/2018  . Physical deconditioning 03/19/2018  . Chronic diastolic heart failure ([place]) 02/09/2018  . Prediabetes 01/26/2017  . Hearing loss 01/19/2016  . Carpal tunnel syndrome 08/31/2015  . Encounter for Medicare annual wellness exam 06/04/2014  . Obesity 06/04/2014  . Estrogen deficiency 06/04/2014  . Colon cancer screening 06/04/2014  . Schatzki's ring 03/27/2014  . Dysphagia 10/17/2011  . OA (osteoarthritis) of knee 08/16/2011  . Hemorrhoids 08/10/2011  . Other dyspnea and respiratory abnormality 06/21/2010  . Urinary incontinence 05/27/2010  . OVERACTIVE BLADDER 03/12/2010  . Lumbar degenerative disc disease 04/13/2009  . GERD 10/15/2008  . CEREBROVASCULAR ACCIDENT, HX OF 10/15/2008  . Arthropathy,  multiple sites 06/26/2008  . EXTERNAL HEMORRHOIDS 02/01/2008  . HIATAL HERNIA 02/01/2008  . DIVERTICULOSIS OF COLON 02/01/2008  . GASTRITIS, HX OF 02/01/2008  . DYSPNEA 09/19/2007  . Vitamin D deficiency 07/03/2007  . Osteoporosis of lumbar spine 06/13/2007  . Fatigue 06/13/2007  . HELICOBACTER PYLORI INFECTION 06/06/2006  . Hyperlipidemia 06/06/2006  . Generalized anxiety disorder 06/06/2006  . Adjustment disorder with mixed anxiety and depressed mood 06/06/2006  . MACULAR DEGENERATION 06/06/2006  . H/O: CVA (cerebrovascular accident) 06/06/2006  . OSTEOARTHRITIS 06/06/2006   Past Medical History:  Diagnosis Date  . Alternating constipation and diarrhea   . Anxiety state, unspecified   . Complication of anesthesia    slow to wake up with surgery several years ago   . COVID-19 virus infection 12/2018  . Depressive disorder, not elsewhere classified   .  Diaphragmatic hernia without mention of obstruction or gangrene   . Difficulty sleeping   . Diverticulosis of colon (without mention of hemorrhage)   . Esophageal reflux   . External hemorrhoids without mention of complication   . Fatigue   . Frequency of urination   . Helicobacter pylori (H. pylori)   . Hiatal hernia   . History of transfusion   . Lumbar spondylosis   . Macular degeneration (senile) of retina, unspecified   . Osteoarthrosis, unspecified whether generalized or localized, unspecified site 08-11-11   hx. "rhematoid arthritis", osteoarthritis, DDD, bursitis(hip)  . Osteoporosis   . Palpitations   . PONV (postoperative nausea and vomiting)   . Schatzki's ring   . Shortness of breath 08-11-11   with exertion only at present  . Unspecified cerebral artery occlusion with cerebral infarction 08-11-11   '97/ '06( TIA)-affected lt. side, slight weakness L side  . Unspecified vitamin D deficiency    Past Surgical History:  Procedure Laterality Date  . ABDOMINAL HYSTERECTOMY  08-11-11  . APPENDECTOMY    . BACK  SURGERY  08-11-11   '11-hx. lumbar fusion with retained hardware  . CATARACT EXTRACTION  08-11-11   Bilateral  . ESOPHAGEAL DILATION    . KNEE SURGERY  08-11-11   rt. knee scope  . PATELLAR TENDON REPAIR Left 02/22/2014   Procedure: PATELLA TENDON REPAIR;  Surgeon: Gearlean Alf, MD;  Location: [facility];  Service: Orthopedics;  Laterality: Left;  . PATELLAR TENDON REPAIR Left 06/25/2014   Procedure: LEFT PATELLA TENDON REPAIR;  Surgeon: Gaynelle Arabian, MD;  Location: [facility];  Service: Orthopedics;  Laterality: Left;  . TOTAL KNEE ARTHROPLASTY  08/16/2011   Procedure: TOTAL KNEE ARTHROPLASTY;  Surgeon: Gearlean Alf, MD;  Location: [facility];  Service: Orthopedics;  Laterality: Right;  . TOTAL KNEE ARTHROPLASTY Left 01/27/2014   Procedure: LEFT TOTAL KNEE ARTHROPLASTY;  Surgeon: Gearlean Alf, MD;  Location: [facility];  Service: Orthopedics;  Laterality: Left;  . TUBAL LIGATION     Social History   Tobacco Use  . Smoking status: Never Smoker  . Smokeless tobacco: Never Used  Vaping Use  . Vaping Use: Never used  Substance Use Topics  . Alcohol use: No    Alcohol/week: 0.0 standard drinks  . Drug use: No   Family History  Problem Relation Age of Onset  . Heart disease Father   . Coronary artery disease Brother   . Diabetes Brother   . Heart disease Brother   . Pancreatic cancer Daughter   . Diabetes Sister   . Colon cancer Neg Hx   . Colon polyps Neg Hx   . Esophageal cancer Neg Hx   . Gallbladder disease Neg Hx    Allergies  Allergen Reactions  . Alendronate Sodium     REACTION: JAW PAIN  . Alendronate Sodium Other (See Comments)    REACTION: JAW PAIN  . Celecoxib Other (See Comments)    REACTION: GI UPSET REACTION: GI UPSET  . Omeprazole Nausea Only  . Protonix [Pantoprazole Sodium] Nausea And Vomiting  . Rabeprazole Nausea And Vomiting  . Latex Itching and Rash   Current Outpatient Medications on File Prior to Visit  Medication Sig Dispense Refill  . acetaminophen  (TYLENOL) 325 MG tablet Take 2 tablets (650 mg total) by mouth every 6 (six) hours as needed for mild pain (or Fever >/= 101). 40 tablet 0  . ALPRAZolam (XANAX) 0.5 MG tablet TAKE 1 TABLET AT BEDTIME AS NEEDED FOR  SLEEP 90 tablet 0  . atorvastatin (LIPITOR) 10 MG tablet TAKE 1 TABLET DAILY 90 tablet 0  . calcium carbonate (TUMS - DOSED IN MG ELEMENTAL CALCIUM) 500 MG chewable tablet Chew 1 tablet (200 mg of elemental calcium total) by mouth 4 (four) times daily as needed for indigestion or heartburn. 10 tablet 0  . CALCIUM-MAGNESIUM-ZINC PO Take 1 capsule by mouth daily.    . clopidogrel (PLAVIX) 75 MG tablet TAKE 1 TABLET DAILY 90 tablet 1  . diclofenac sodium (VOLTAREN) 1 % GEL Apply 1 application topically 2 (two) times daily.    Marland Kitchen esomeprazole (NEXIUM) 40 MG capsule TAKE 1 CAPSULE TWICE A DAY BEFORE MEALS 180 capsule 3  . famotidine (PEPCID) 40 MG tablet Take 1 tablet (40 mg total) by mouth daily before breakfast. 30 tablet 2  . furosemide (LASIX) 20 MG tablet TAKE 1 TABLET DAILY 90 tablet 2  . glycopyrrolate (ROBINUL) 2 MG tablet Take 1 tablet (2 mg total) by mouth daily. 30 tablet 2  . HYDROcodone-acetaminophen (NORCO) 10-325 MG tablet Take 1 tablet by mouth every 6 (six) hours as needed.   0  . metoprolol succinate (TOPROL-XL) 25 MG 24 hr tablet TAKE ONE-HALF (1/2) TABLET BY MOUTH AT BEDTIME 45 tablet 3  . Multiple Vitamins-Minerals (PRESERVISION AREDS PO) Take 1 capsule by mouth 2 (two) times daily.    . ondansetron (ZOFRAN) 4 MG tablet Take 1 tablet (4 mg total) by mouth every 6 (six) hours as needed for nausea or vomiting. 30 tablet 1  . Polyethyl Glycol-Propyl Glycol (SYSTANE OP) Apply 1-2 drops to eye 3 (three) times daily as needed (dry eyes.).     Marland Kitchen sertraline (ZOLOFT) 100 MG tablet TAKE 2 TABLETS DAILY 180 tablet 0   No current facility-administered medications on file prior to visit.     Review of Systems  Constitutional: Negative for activity change, appetite change, fatigue,  fever and unexpected weight change.  HENT: Negative for congestion, ear pain, rhinorrhea, sinus pressure and sore throat.   Eyes: Negative for pain, redness and visual disturbance.  Respiratory: Negative for cough, shortness of breath and wheezing.   Cardiovascular: Negative for chest pain and palpitations.  Gastrointestinal: Negative for abdominal pain, blood in stool, constipation and diarrhea.  Endocrine: Negative for polydipsia and polyuria.  Genitourinary: Negative for dysuria, frequency and urgency.  Musculoskeletal: Positive for arthralgias and back pain. Negative for myalgias.  Skin: Negative for pallor and rash.  Allergic/Immunologic: Negative for environmental allergies.  Neurological: Negative for dizziness, syncope and headaches.       Poor balance  Motility impaired   Hematological: Negative for adenopathy. Does not bruise/bleed easily.  Psychiatric/Behavioral: Positive for dysphoric mood. Negative for confusion and decreased concentration. The patient is nervous/anxious.        Objective:   Physical Exam Constitutional:      General: She is not in acute distress.    Appearance: Normal appearance. She is well-developed. She is obese. She is not ill-appearing or diaphoretic.  HENT:     Head: Normocephalic and atraumatic.     Right Ear: Tympanic membrane, ear canal and external ear normal.     Left Ear: Tympanic membrane, ear canal and external ear normal.     Nose: Nose normal. No congestion.     Mouth/Throat:     Mouth: Mucous membranes are moist.     Pharynx: Oropharynx is clear. No posterior oropharyngeal erythema.  Eyes:     General: No scleral icterus.    Extraocular Movements:  Extraocular movements intact.     Conjunctiva/sclera: Conjunctivae normal.     Pupils: Pupils are equal, round, and reactive to light.  Neck:     Thyroid: No thyromegaly.     Vascular: No carotid bruit or JVD.  Cardiovascular:     Rate and Rhythm: Normal rate and regular rhythm.      Pulses: Normal pulses.     Heart sounds: Normal heart sounds. No gallop.   Pulmonary:     Effort: Pulmonary effort is normal. No respiratory distress.     Breath sounds: Normal breath sounds. No wheezing.     Comments: Good air exch Chest:     Chest wall: No tenderness.  Abdominal:     General: Bowel sounds are normal. There is no distension or abdominal bruit.     Palpations: Abdomen is soft. There is no mass.     Tenderness: There is no abdominal tenderness.     Hernia: No hernia is present.  Genitourinary:    Comments: Breast exam: No mass, nodules, thickening, tenderness, bulging, retraction, inflamation, nipple discharge or skin changes noted.  No axillary or clavicular LA.     Exam done sitting Musculoskeletal:        General: No tenderness. Normal range of motion.     Cervical back: Normal range of motion and neck supple. No rigidity. No muscular tenderness.     Right lower leg: No edema.     Left lower leg: No edema.     Comments: Mild kyphosis  Lymphadenopathy:     Cervical: No cervical adenopathy.  Skin:    General: Skin is warm and dry.     Coloration: Skin is not pale.     Findings: No erythema or rash.     Comments: Solar lentigines diffusely Scattered sks  Neurological:     Mental Status: She is alert. Mental status is at baseline.     Cranial Nerves: No cranial nerve deficit.     Motor: No abnormal muscle tone.     Coordination: Coordination normal.     Gait: Gait normal.     Deep Tendon Reflexes: Reflexes are normal and symmetric. Reflexes normal.  Psychiatric:        Mood and Affect: Mood normal.        Cognition and Memory: Cognition and memory normal.     Comments: Mentally sharp today           Assessment & Plan:   Problem List Items Addressed This Visit      Cardiovascular and Mediastinum   Chronic diastolic heart failure ([place])    Per pt -continues to wear 02 for night time hypoxia and f/u with cardiology and pulmonary  bp and pulse are  stable         Digestive   Dysphagia    Pt continues GERD tx and GI f/u for this        Nervous and Auditory   Trigeminal neuralgia    Pt wants to continue gabapentin but does not plan to return to neurology  This was refilled 200 mg tid Disc possible side effect of brain fog /word searching       Relevant Medications   gabapentin (NEURONTIN) 100 MG capsule     Musculoskeletal and Integument   Osteoporosis of lumbar spine    dexa 9/21-mixed trend In osteopenia range  No new falls or fx  D level is tx Exercising          Other  Vitamin D deficiency    Level 42.5  Vitamin D level is therapeutic with current supplementation Disc importance of this to bone and overall health       Hyperlipidemia    Disc goals for lipids and reasons to control them Rev last labs with pt Rev low sat fat diet in detail Plan to continue atorvastatin 10 mg daily  Overall stable      Generalized anxiety disorder     Plan to continue zoloft 200 mg daily  Reviewed stressors/ coping techniques/symptoms/ support sources/ tx options and side effects in detail today       Encounter for Medicare annual wellness exam - Primary    Reviewed health habits including diet and exercise and skin cancer prevention Reviewed appropriate screening tests for age  Also reviewed health mt list, fam hx and immunization status , as well as social and family history   See HPI utd cologuard and mammogram  Due for covid booster after late may  dexa utd with no new falls and D is tx  Reviewed cognitive concerns (short term memory) and no changes today Has hearing aides and utd eye/vision care  Care team reviewed       Prediabetes    Lab Results  Component Value Date   HGBA1C 6.1 03/19/2020   disc imp of low glycemic diet and wt loss to prevent DM2

## 2020-05-11 NOTE — Assessment & Plan Note (Signed)
Per pt -continues to wear 02 for night time hypoxia and f/u with cardiology and pulmonary  bp and pulse are stable

## 2020-05-16 DIAGNOSIS — H353132 Nonexudative age-related macular degeneration, bilateral, intermediate dry stage: Secondary | ICD-10-CM | POA: Diagnosis not present

## 2020-05-29 ENCOUNTER — Other Ambulatory Visit: Payer: Self-pay | Admitting: Physician Assistant

## 2020-06-06 ENCOUNTER — Emergency Department (HOSPITAL_BASED_OUTPATIENT_CLINIC_OR_DEPARTMENT_OTHER)
Admission: EM | Admit: 2020-06-06 | Discharge: 2020-06-06 | Disposition: A | Discharge status: Home / Self Care | Payer: Medicare Other | Attending: Emergency Medicine | Admitting: Emergency Medicine

## 2020-06-06 ENCOUNTER — Encounter (HOSPITAL_BASED_OUTPATIENT_CLINIC_OR_DEPARTMENT_OTHER): Payer: Self-pay | Admitting: Emergency Medicine

## 2020-06-06 ENCOUNTER — Other Ambulatory Visit: Payer: Self-pay

## 2020-06-06 ENCOUNTER — Emergency Department (HOSPITAL_BASED_OUTPATIENT_CLINIC_OR_DEPARTMENT_OTHER): Payer: Medicare Other

## 2020-06-06 DIAGNOSIS — R0600 Dyspnea, unspecified: Secondary | ICD-10-CM

## 2020-06-06 DIAGNOSIS — R0602 Shortness of breath: Secondary | ICD-10-CM | POA: Diagnosis not present

## 2020-06-06 DIAGNOSIS — Z7902 Long term (current) use of antithrombotics/antiplatelets: Secondary | ICD-10-CM | POA: Insufficient documentation

## 2020-06-06 DIAGNOSIS — Z8616 Personal history of COVID-19: Secondary | ICD-10-CM | POA: Insufficient documentation

## 2020-06-06 DIAGNOSIS — M79662 Pain in left lower leg: Secondary | ICD-10-CM | POA: Diagnosis not present

## 2020-06-06 DIAGNOSIS — M79661 Pain in right lower leg: Secondary | ICD-10-CM | POA: Diagnosis not present

## 2020-06-06 DIAGNOSIS — M7989 Other specified soft tissue disorders: Secondary | ICD-10-CM | POA: Diagnosis not present

## 2020-06-06 DIAGNOSIS — R0609 Other forms of dyspnea: Secondary | ICD-10-CM

## 2020-06-06 DIAGNOSIS — I5032 Chronic diastolic (congestive) heart failure: Secondary | ICD-10-CM | POA: Diagnosis not present

## 2020-06-06 DIAGNOSIS — Z91040 Latex allergy status: Secondary | ICD-10-CM | POA: Diagnosis not present

## 2020-06-06 DIAGNOSIS — I517 Cardiomegaly: Secondary | ICD-10-CM | POA: Diagnosis not present

## 2020-06-06 DIAGNOSIS — Z96652 Presence of left artificial knee joint: Secondary | ICD-10-CM | POA: Diagnosis not present

## 2020-06-06 DIAGNOSIS — I959 Hypotension, unspecified: Secondary | ICD-10-CM | POA: Diagnosis not present

## 2020-06-06 DIAGNOSIS — J9811 Atelectasis: Secondary | ICD-10-CM | POA: Diagnosis not present

## 2020-06-06 LAB — CBC WITH DIFFERENTIAL/PLATELET
Abs Immature Granulocytes: 0.02 10*3/uL (ref 0.00–0.07)
Basophils Absolute: 0 10*3/uL (ref 0.0–0.1)
Basophils Relative: 1 %
Eosinophils Absolute: 0.2 10*3/uL (ref 0.0–0.5)
Eosinophils Relative: 3 %
HCT: 39.1 % (ref 36.0–46.0)
Hemoglobin: 12.7 g/dL (ref 12.0–15.0)
Immature Granulocytes: 0 %
Lymphocytes Relative: 35 %
Lymphs Abs: 1.9 10*3/uL (ref 0.7–4.0)
MCH: 29.4 pg (ref 26.0–34.0)
MCHC: 32.5 g/dL (ref 30.0–36.0)
MCV: 90.5 fL (ref 80.0–100.0)
Monocytes Absolute: 0.6 10*3/uL (ref 0.1–1.0)
Monocytes Relative: 10 %
Neutro Abs: 2.7 10*3/uL (ref 1.7–7.7)
Neutrophils Relative %: 51 %
Platelets: 215 10*3/uL (ref 150–400)
RBC: 4.32 MIL/uL (ref 3.87–5.11)
RDW: 13.2 % (ref 11.5–15.5)
WBC: 5.4 10*3/uL (ref 4.0–10.5)
nRBC: 0 % (ref 0.0–0.2)

## 2020-06-06 LAB — BASIC METABOLIC PANEL
Anion gap: 9 (ref 5–15)
BUN: 12 mg/dL (ref 8–23)
CO2: 29 mmol/L (ref 22–32)
Calcium: 8.7 mg/dL — ABNORMAL LOW (ref 8.9–10.3)
Chloride: 105 mmol/L (ref 98–111)
Creatinine, Ser: 0.86 mg/dL (ref 0.44–1.00)
GFR, Estimated: >60 mL/min (ref >60)
Glucose, Bld: 93 mg/dL (ref 70–99)
Potassium: 3.7 mmol/L (ref 3.5–5.1)
Sodium: 143 mmol/L (ref 135–145)

## 2020-06-06 LAB — BRAIN NATRIURETIC PEPTIDE: B Natriuretic Peptide: 353.5 pg/mL — ABNORMAL HIGH (ref 0.0–100.0)

## 2020-06-06 MED ORDER — POTASSIUM CHLORIDE CRYS ER 20 MEQ PO TBCR
20.0000 meq | EXTENDED_RELEASE_TABLET | Freq: Once | ORAL | Status: AC
  Administered 2020-06-06: 20 meq via ORAL
  Filled 2020-06-06: qty 1

## 2020-06-06 NOTE — ED Notes (Signed)
Gave verbal understanding of discharge instructions

## 2020-06-06 NOTE — Discharge Instructions (Signed)
We saw you in the ER for the shortness of breath and leg swelling. All of our cardiac workup is normal, including labs, EKG and chest X-RAY are normal. We are not sure what is causing your discomfort, but we feel comfortable sending you home at this time. The workup in the ER is not complete, and you should follow up with your primary care doctor for further evaluation.  Please return to the ER if you have worsening chest pain, shortness of breath, pain radiating to your jaw, shoulder, or back, sweats or fainting. Otherwise see the Cardiologist or your primary care doctor as requested.  For the leg swelling, please wear compression stockings during the daytime and you may remove the stockings at night, but elevate your legs at that time.

## 2020-06-06 NOTE — ED Provider Notes (Signed)
[facility] EMERGENCY DEPT Provider Note   CSN: [id number] Arrival date & time: 06/06/20  1201     History Chief Complaint  Patient presents with  . Shortness of Breath    [name] [name] is a 86 y.o. female.  HPI    86 year old female comes in with chief complaint of shortness of breath. Patient has history of diastolic CHF, remote history of COVID-19.  She comes in because over the past few days she has been having exertional shortness of breath.  She at baseline has exertional dyspnea, but thinks that less work gets her short of breath now.  She denies any associated orthopnea, PND, cough and there is no fevers, chills.  Patient also denies any chest pain.  She does indicate some leg swelling that is more pronounced, but its off and on without any intervention.  No new medications.  Past Medical History:  Diagnosis Date  . Alternating constipation and diarrhea   . Anxiety state, unspecified   . Complication of anesthesia    slow to wake up with surgery several years ago   . COVID-19 virus infection 12/2018  . Depressive disorder, not elsewhere classified   . Diaphragmatic hernia without mention of obstruction or gangrene   . Difficulty sleeping   . Diverticulosis of colon (without mention of hemorrhage)   . Esophageal reflux   . External hemorrhoids without mention of complication   . Fatigue   . Frequency of urination   . Helicobacter pylori (H. pylori)   . Hiatal hernia   . History of transfusion   . Lumbar spondylosis   . Macular degeneration (senile) of retina, unspecified   . Osteoarthrosis, unspecified whether generalized or localized, unspecified site 08-11-11   hx. "rhematoid arthritis", osteoarthritis, DDD, bursitis(hip)  . Osteoporosis   . Palpitations   . PONV (postoperative nausea and vomiting)   . Schatzki's ring   . Shortness of breath 08-11-11   with exertion only at present  . Unspecified cerebral artery occlusion with cerebral  infarction 08-11-11   '97/ '06( TIA)-affected lt. side, slight weakness L side  . Unspecified vitamin D deficiency     Patient Active Problem List   Diagnosis Date Noted  . Myalgia 12/09/2019  . 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 Chronic tension-type headache, not intractable 09/06/2019  . Ageusia 09/06/2019  . Anosmia 09/06/2019  . History of COVID-19 07/30/2019  . Olfactory impairment 07/30/2019  . Trigeminal neuralgia 03/15/2019  . Mobility impaired 12/11/2018  . Poor balance 08/13/2018  . Memory loss 06/27/2018  . Physical deconditioning 03/19/2018  . Chronic diastolic heart failure ([place]) 02/09/2018  . Prediabetes 01/26/2017  . Hearing loss 01/19/2016  . Carpal tunnel syndrome 08/31/2015  . Encounter for Medicare annual wellness exam 06/04/2014  . Obesity 06/04/2014  . Estrogen deficiency 06/04/2014  . Colon cancer screening 06/04/2014  . Schatzki's ring 03/27/2014  . Dysphagia 10/17/2011  . OA (osteoarthritis) of knee 08/16/2011  . Hemorrhoids 08/10/2011  . Other dyspnea and respiratory abnormality 06/21/2010  . Urinary incontinence 05/27/2010  . OVERACTIVE BLADDER 03/12/2010  . Lumbar degenerative disc disease 04/13/2009  . GERD 10/15/2008  . CEREBROVASCULAR ACCIDENT, HX OF 10/15/2008  . Arthropathy, multiple sites 06/26/2008  . EXTERNAL HEMORRHOIDS 02/01/2008  . HIATAL HERNIA 02/01/2008  . DIVERTICULOSIS OF COLON 02/01/2008  . GASTRITIS, HX OF 02/01/2008  . DYSPNEA 09/19/2007  . Vitamin D deficiency 07/03/2007  . Osteoporosis of lumbar spine 06/13/2007  . Fatigue 06/13/2007  . HELICOBACTER PYLORI INFECTION  06/06/2006  . Hyperlipidemia 06/06/2006  . Generalized anxiety disorder 06/06/2006  . Adjustment disorder with mixed anxiety and depressed mood 06/06/2006  . MACULAR DEGENERATION 06/06/2006  . H/O: CVA (cerebrovascular accident) 06/06/2006  . OSTEOARTHRITIS 06/06/2006    Past Surgical History:  Procedure Laterality Date  .  ABDOMINAL HYSTERECTOMY  08-11-11  . APPENDECTOMY    . BACK SURGERY  08-11-11   '11-hx. lumbar fusion with retained hardware  . CATARACT EXTRACTION  08-11-11   Bilateral  . ESOPHAGEAL DILATION    . KNEE SURGERY  08-11-11   rt. knee scope  . PATELLAR TENDON REPAIR Left 02/22/2014   Procedure: PATELLA TENDON REPAIR;  Surgeon: Gearlean Alf, MD;  Location: [facility];  Service: Orthopedics;  Laterality: Left;  . PATELLAR TENDON REPAIR Left 06/25/2014   Procedure: LEFT PATELLA TENDON REPAIR;  Surgeon: Gaynelle Arabian, MD;  Location: [facility];  Service: Orthopedics;  Laterality: Left;  . TOTAL KNEE ARTHROPLASTY  08/16/2011   Procedure: TOTAL KNEE ARTHROPLASTY;  Surgeon: Gearlean Alf, MD;  Location: [facility];  Service: Orthopedics;  Laterality: Right;  . TOTAL KNEE ARTHROPLASTY Left 01/27/2014   Procedure: LEFT TOTAL KNEE ARTHROPLASTY;  Surgeon: Gearlean Alf, MD;  Location: [facility];  Service: Orthopedics;  Laterality: Left;  . TUBAL LIGATION       OB History   No obstetric history on file.     Family History  Problem Relation Age of Onset  . Heart disease Father   . Coronary artery disease Brother   . Diabetes Brother   . Heart disease Brother   . Pancreatic cancer Daughter   . Diabetes Sister   . Colon cancer Neg Hx   . Colon polyps Neg Hx   . Esophageal cancer Neg Hx   . Gallbladder disease Neg Hx     Social History   Tobacco Use  . Smoking status: Never Smoker  . Smokeless tobacco: Never Used  Vaping Use  . Vaping Use: Never used  Substance Use Topics  . Alcohol use: No    Alcohol/week: 0.0 standard drinks  . Drug use: No    Home Medications Prior to Admission medications   Medication Sig Start Date End Date Taking? Authorizing Provider  atorvastatin (LIPITOR) 10 MG tablet TAKE 1 TABLET DAILY 04/13/20  Yes Tower, Wynelle Fanny, MD  calcium carbonate (TUMS - DOSED IN MG ELEMENTAL CALCIUM) 500 MG chewable tablet Chew 1 tablet (200 mg of elemental calcium total) by mouth 4 (four) times  daily as needed for indigestion or heartburn. 02/25/14  Yes Perkins, Alexzandrew L, PA-C  CALCIUM-MAGNESIUM-ZINC PO Take 1 capsule by mouth daily.   Yes [provider]  clopidogrel (PLAVIX) 75 MG tablet TAKE 1 TABLET DAILY 03/19/20  Yes Tower, Marne A, MD  diclofenac sodium (VOLTAREN) 1 % GEL Apply 1 application topically 2 (two) times daily. 12/22/15  Yes [provider]  esomeprazole (NEXIUM) 40 MG capsule TAKE 1 CAPSULE TWICE A DAY BEFORE MEALS 08/06/19  Yes Tower, Marne A, MD  famotidine (PEPCID) 40 MG tablet TAKE 1 TABLET(40 MG) BY MOUTH DAILY BEFORE BREAKFAST 05/29/20  Yes Esterwood, Amy S, PA-C  furosemide (LASIX) 20 MG tablet TAKE 1 TABLET DAILY 01/13/20  Yes Sherren Mocha, MD  gabapentin (NEURONTIN) 100 MG capsule Take 2 capsules (200 mg total) by mouth 3 (three) times daily. 05/11/20  Yes Tower, Wynelle Fanny, MD  HYDROcodone-acetaminophen (NORCO) 10-325 MG tablet Take 1 tablet by mouth every 6 (six) hours as needed.  05/07/15  Yes  [provider]  metoprolol succinate (TOPROL-XL) 25 MG 24 hr tablet TAKE ONE-HALF (1/2) TABLET BY MOUTH AT BEDTIME 11/26/19  Yes Sherren Mocha, MD  Multiple Vitamins-Minerals (PRESERVISION AREDS PO) Take 1 capsule by mouth 2 (two) times daily.   Yes [provider]  Polyethyl Glycol-Propyl Glycol (SYSTANE OP) Apply 1-2 drops to eye 3 (three) times daily as needed (dry eyes.).    Yes [provider]  sertraline (ZOLOFT) 100 MG tablet TAKE 2 TABLETS DAILY 04/07/20  Yes Tower, Wynelle Fanny, MD  acetaminophen (TYLENOL) 325 MG tablet Take 2 tablets (650 mg total) by mouth every 6 (six) hours as needed for mild pain (or Fever >/= 101). 01/29/14   Perkins, Alexzandrew L, PA-C  ALPRAZolam (XANAX) 0.5 MG tablet TAKE 1 TABLET AT BEDTIME AS NEEDED FOR SLEEP 04/03/19   Tower, Wynelle Fanny, MD  glycopyrrolate (ROBINUL) 2 MG tablet Take 1 tablet (2 mg total) by mouth daily. 03/20/20   Esterwood, Amy S, PA-C  ondansetron (ZOFRAN) 4 MG tablet Take 1 tablet (4  mg total) by mouth every 6 (six) hours as needed for nausea or vomiting. 04/09/20   Esterwood, Amy S, PA-C    Allergies    Alendronate sodium, Alendronate sodium, Celecoxib, Omeprazole, Protonix [pantoprazole sodium], Rabeprazole, and Latex  Review of Systems   Review of Systems  Constitutional: Positive for activity change.  HENT: Negative for congestion.   Respiratory: Positive for shortness of breath.   Cardiovascular: Negative for chest pain.  Gastrointestinal: Negative for nausea and vomiting.  All other systems reviewed and are negative.   Physical Exam Updated Vital Signs BP (!) 112/42   Pulse (!) 50   Temp 97.7 F (36.5 C) (Oral)   Resp 18   Ht 5\' 2"  (1.575 m)   Wt 81.6 kg   SpO2 100%   BMI 32.92 kg/m   Physical Exam Vitals and nursing note reviewed.  Constitutional:      Appearance: She is well-developed.  HENT:     Head: Normocephalic and atraumatic.  Eyes:     Pupils: Pupils are equal, round, and reactive to light.  Cardiovascular:     Rate and Rhythm: Normal rate and regular rhythm.     Heart sounds: Normal heart sounds. No murmur heard.   Pulmonary:     Effort: Pulmonary effort is normal. No respiratory distress.     Breath sounds: No decreased breath sounds.  Abdominal:     General: There is no distension.     Palpations: Abdomen is soft.     Tenderness: There is no abdominal tenderness. There is no guarding or rebound.  Musculoskeletal:     Cervical back: Neck supple.  Skin:    General: Skin is warm and dry.  Neurological:     Mental Status: She is alert and oriented to person, place, and time.     ED Results / Procedures / Treatments   Labs (all labs ordered are listed, but only abnormal results are displayed) Labs Reviewed  BASIC METABOLIC PANEL - Abnormal; Notable for the following components:      Result Value   Calcium 8.7 (*)    All other components within normal limits  BRAIN NATRIURETIC PEPTIDE - Abnormal; Notable for the  following components:   B Natriuretic Peptide 353.5 (*)    All other components within normal limits  CBC WITH DIFFERENTIAL/PLATELET    EKG EKG Interpretation  Date/Time:  Saturday Jun 06 2020 13:39:16 EDT Ventricular Rate:  51 PR Interval:  155  QRS Duration: 91 QT Interval:  467 QTC Calculation: 431 R Axis:   79 Text Interpretation: Sinus rhythm [id number] Nonspecific ST and T wave abnormality No significant change since last tracing Confirmed by Varney Biles [phone]) on 06/06/2020 3:31:06 PM   Radiology DG Chest Portable 1 View  Result Date: 06/06/2020 CLINICAL DATA:  Shortness of breath EXAM: PORTABLE CHEST 1 VIEW COMPARISON:  None. FINDINGS: Cardiomegaly. Bibasilar atelectasis. No overt edema or effusions. No acute bony abnormality. IMPRESSION: Cardiomegaly.  Bibasilar atelectasis. Electronically Signed   By: Rolm Baptise M.D.   On: 06/06/2020 14:42    Procedures Procedures   Medications Ordered in ED Medications  potassium chloride SA (KLOR-CON) CR tablet 20 mEq (20 mEq Oral Given 06/06/20 1354)    ED Course  I have reviewed the triage vital signs and the nursing notes.  Pertinent labs & imaging results that were available during my care of the patient were reviewed by me and considered in my medical decision making (see chart for details).    MDM Rules/Calculators/A&P                          86 year old comes in a chief complaint of shortness of breath.  Patient is having exertional dyspnea, no chest pain and there is no orthopnea, PND.  Leg exam did not reveal any evidence of unilateral swelling or pitting edema or calf tenderness.  Low suspicion for PE, DVT.  Lung exam is clear, no rales.  Clinically does not sound like CHF.  ACS considered in the differential, EKG is not showing any concerning findings.  Initial labs revealed elevated BNP.  Concerns are that she could be having diastolic heart failure.  We also discussed ACS is a possibility but patient did not  feel strongly about getting troponins midway into her stay in the ER.  We reviewed patient's last stress test which was end of 2020 and it was normal.  Patient reports that the stress test was because of shortness of breath.  We discussed the utility of getting troponins to ensure that there is no myocardial injury.  Given that patient symptoms have been present for few days, she is actually comfortable following up as an outpatient with cardiology service to be assessed for echo versus stress test.  I will send a message to cardiology service to get her in as soon as possible.  Strict ER return precautions also discussed with the patient and her daughter who is at the bedside.  Final Clinical Impression(s) / ED Diagnoses Final diagnoses:  Exertional dyspnea    Rx / DC Orders ED Discharge Orders    None       Varney Biles, MD 06/07/20 [phone]

## 2020-06-06 NOTE — ED Triage Notes (Signed)
Short of breath started this am, went to urgent care at [facility]. Bilateral legs are red/blue color. Has history of heart failure.

## 2020-06-08 ENCOUNTER — Telehealth: Payer: Self-pay

## 2020-06-08 ENCOUNTER — Other Ambulatory Visit: Payer: Self-pay

## 2020-06-08 ENCOUNTER — Telehealth: Payer: Self-pay | Admitting: Physician Assistant

## 2020-06-08 DIAGNOSIS — K580 Irritable bowel syndrome with diarrhea: Secondary | ICD-10-CM

## 2020-06-08 NOTE — Telephone Encounter (Signed)
Spoke with the patient. She reports urgent diarrhea "for months." She tells me it is the same as when she came in in March to be seen. She is taking fiber with a pre-biotic. She eats and almost immediately has to go to the bathroom. There have been spells of incontinence. Stools are watery, no blood or mucous that she has noticed. No nausea, abdominal pain or recent antibiotics. She has not tried the glycopyrrolate but she will try it today. Please advise.

## 2020-06-08 NOTE — Telephone Encounter (Signed)
Spoke with pt and she is aware of Dr. Ardis Hughs recommendations. Pt to come for lab, order in epic. Pt scheduled to see Dr. Ardis Hughs 07/15/20@10 :10am. Pt aware of appt.

## 2020-06-08 NOTE — Telephone Encounter (Signed)
She needs GI pathogen panel. She needs my first available OV. She should stop the fiber, she should stop the 'pre-biotic'.  She should start taking 1 OTC imodium every morning on a scheduled basis and only hold for 1-2 days if she becomes constipated.

## 2020-06-08 NOTE — Telephone Encounter (Signed)
Pt has been experiencing watery diarrhea and would like some advise.

## 2020-06-08 NOTE — Telephone Encounter (Signed)
Per ER MD recommendation, called patient and scheduled her 5/26 at 1040 with Dr. Burt Knack. The patient was grateful for call and agrees with plan.

## 2020-06-09 ENCOUNTER — Ambulatory Visit: Payer: Medicare Other | Admitting: Adult Health

## 2020-06-11 ENCOUNTER — Other Ambulatory Visit: Payer: Self-pay

## 2020-06-11 ENCOUNTER — Encounter: Payer: Self-pay | Admitting: Cardiovascular Disease

## 2020-06-11 ENCOUNTER — Ambulatory Visit (INDEPENDENT_AMBULATORY_CARE_PROVIDER_SITE_OTHER): Payer: Medicare Other | Admitting: Cardiovascular Disease

## 2020-06-11 VITALS — BP 116/60 | HR 70 | Ht 64.0 in | Wt 195.0 lb

## 2020-06-11 DIAGNOSIS — I5032 Chronic diastolic (congestive) heart failure: Secondary | ICD-10-CM

## 2020-06-11 MED ORDER — FUROSEMIDE 20 MG PO TABS: ORAL_TABLET | ORAL | 3 refills | Status: DC

## 2020-06-11 MED ORDER — POTASSIUM CHLORIDE ER 10 MEQ PO TBCR: 10.0000 meq | EXTENDED_RELEASE_TABLET | Freq: Every day | ORAL | 3 refills | Status: DC

## 2020-06-11 NOTE — Patient Instructions (Signed)
Medication Instructions:  1) ALTERNATE LASIX 20 mg and 40 mg every other day 2) START KDUR (potassium supplement) 10 meq daily *If you need a refill on your cardiac medications before your next appointment, please call your pharmacy*  Lab Work: Your provider recommends that you return for lab work in [place]. You do not need to be fasting. You may come ANY TIME the day of your appointment between 7:30AM and 4:30PM. If you have labs (blood work) drawn today and your tests are completely normal, you will receive your results only by: Marland Kitchen MyChart Message (if you have MyChart) OR . A paper copy in the mail If you have any lab test that is abnormal or we need to change your treatment, we will call you to review the results.  Follow-Up: At [facility], you and your health needs are our priority.  As part of our continuing mission to provide you with exceptional heart care, we have created designated Provider Care Teams.  These Care Teams include your primary Cardiologist (physician) and Advanced Practice Providers (APPs -  Physician Assistants and Nurse Practitioners) who all work together to provide you with the care you need, when you need it. Your next appointment:   6 month(s) The format for your next appointment:   In Person Provider:   You will see one of the following Advanced Practice Providers on your designated Care Team:    [name] Dopp, PA-C  Robbie Lis, PA-C  Then, Sherren Mocha, MD will plan to see you again in 1 year(s).

## 2020-06-11 NOTE — Progress Notes (Signed)
Cardiology Office Note:    Date:  06/11/2020   ID:  [name], DOB 31-Aug-1934, MRN [id number]  PCP:  Abner Greenspan, MD   [facility] Cardiologist:  Sherren Mocha, MD     Referring MD: Abner Greenspan, MD   Chief Complaint  Patient presents with  . Shortness of Breath    History of Present Illness:    [name] [name] is a 86 y.o. female with a hx of:  Palpitations   Hx of TIA  Chronic shortness of breath  ? Myoview 02/2018: low risk  ? Echocardiogram 02/2018: EF 60-65, Gr 2 DD  Heart failure with preserved ejection fraction   Coronary Ca2+ on Chest CT  Aortic atherosclerosis   Hyperlipidemia   DJD   Anxiety   GERD   Macular degeneration   Osteoporosis  Hx of COVID-19 in 12/2018  The patient is here with her son today.  She was recently evaluated in the emergency room for progressive shortness of breath.  She actually tells me today that she went in because her legs were bothering her, but she does admit that her breathing has been worse of late.  This has been a chronic complaint but she is more short of breath with activity than in the past.  She has had mild orthopnea.  She denies PND.  She has been compliant with furosemide 20 mg daily.  She does not eat excessive salt.  She has no chest pain or pressure, lightheadedness, or heart palpitations.  We also discussed her use of gabapentin and some of the issues she has been having that might be related to this.  She has felt like she has been in a brain fog at times and also complains of fatigue.    Past Medical History:  Diagnosis Date  . Alternating constipation and diarrhea   . Anxiety state, unspecified   . Complication of anesthesia    slow to wake up with surgery several years ago   . COVID-19 virus infection 12/2018  . Depressive disorder, not elsewhere classified   . Diaphragmatic hernia without mention of obstruction or gangrene   . Difficulty sleeping   . Diverticulosis of  colon (without mention of hemorrhage)   . Esophageal reflux   . External hemorrhoids without mention of complication   . Fatigue   . Frequency of urination   . Helicobacter pylori (H. pylori)   . Hiatal hernia   . History of transfusion   . Lumbar spondylosis   . Macular degeneration (senile) of retina, unspecified   . Osteoarthrosis, unspecified whether generalized or localized, unspecified site 08-11-11   hx. "rhematoid arthritis", osteoarthritis, DDD, bursitis(hip)  . Osteoporosis   . Palpitations   . PONV (postoperative nausea and vomiting)   . Schatzki's ring   . Shortness of breath 08-11-11   with exertion only at present  . Unspecified cerebral artery occlusion with cerebral infarction 08-11-11   '97/ '06( TIA)-affected lt. side, slight weakness L side  . Unspecified vitamin D deficiency     Past Surgical History:  Procedure Laterality Date  . ABDOMINAL HYSTERECTOMY  08-11-11  . APPENDECTOMY    . BACK SURGERY  08-11-11   '11-hx. lumbar fusion with retained hardware  . CATARACT EXTRACTION  08-11-11   Bilateral  . ESOPHAGEAL DILATION    . KNEE SURGERY  08-11-11   rt. knee scope  . PATELLAR TENDON REPAIR Left 02/22/2014   Procedure: PATELLA TENDON REPAIR;  Surgeon: Gearlean Alf,  MD;  Location: [facility];  Service: Orthopedics;  Laterality: Left;  . PATELLAR TENDON REPAIR Left 06/25/2014   Procedure: LEFT PATELLA TENDON REPAIR;  Surgeon: Gaynelle Arabian, MD;  Location: [facility];  Service: Orthopedics;  Laterality: Left;  . TOTAL KNEE ARTHROPLASTY  08/16/2011   Procedure: TOTAL KNEE ARTHROPLASTY;  Surgeon: Gearlean Alf, MD;  Location: [facility];  Service: Orthopedics;  Laterality: Right;  . TOTAL KNEE ARTHROPLASTY Left 01/27/2014   Procedure: LEFT TOTAL KNEE ARTHROPLASTY;  Surgeon: Gearlean Alf, MD;  Location: [facility];  Service: Orthopedics;  Laterality: Left;  . TUBAL LIGATION      Current Medications: Current Meds  Medication Sig  . acetaminophen (TYLENOL) 325 MG tablet Take 2  tablets (650 mg total) by mouth every 6 (six) hours as needed for mild pain (or Fever >/= 101).  Marland Kitchen ALPRAZolam (XANAX) 0.5 MG tablet TAKE 1 TABLET AT BEDTIME AS NEEDED FOR SLEEP  . atorvastatin (LIPITOR) 10 MG tablet TAKE 1 TABLET DAILY  . calcium carbonate (TUMS - DOSED IN MG ELEMENTAL CALCIUM) 500 MG chewable tablet Chew 1 tablet (200 mg of elemental calcium total) by mouth 4 (four) times daily as needed for indigestion or heartburn.  Marland Kitchen CALCIUM-MAGNESIUM-ZINC PO Take 1 capsule by mouth daily.  . clopidogrel (PLAVIX) 75 MG tablet TAKE 1 TABLET DAILY  . diclofenac sodium (VOLTAREN) 1 % GEL Apply 1 application topically 2 (two) times daily.  Marland Kitchen esomeprazole (NEXIUM) 40 MG capsule TAKE 1 CAPSULE TWICE A DAY BEFORE MEALS  . famotidine (PEPCID) 40 MG tablet TAKE 1 TABLET(40 MG) BY MOUTH DAILY BEFORE BREAKFAST  . furosemide (LASIX) 20 MG tablet Take 20 mg (1 tablet) every other day. Alternate with 40 mg (2 tablets) every other day.  . gabapentin (NEURONTIN) 100 MG capsule Take 2 capsules (200 mg total) by mouth 3 (three) times daily.  Marland Kitchen glycopyrrolate (ROBINUL) 2 MG tablet Take 1 tablet (2 mg total) by mouth daily.  Marland Kitchen HYDROcodone-acetaminophen (NORCO) 10-325 MG tablet Take 1 tablet by mouth every 6 (six) hours as needed.   . metoprolol succinate (TOPROL-XL) 25 MG 24 hr tablet TAKE ONE-HALF (1/2) TABLET BY MOUTH AT BEDTIME  . Multiple Vitamins-Minerals (PRESERVISION AREDS PO) Take 1 capsule by mouth 2 (two) times daily.  . ondansetron (ZOFRAN) 4 MG tablet Take 1 tablet (4 mg total) by mouth every 6 (six) hours as needed for nausea or vomiting.  Vladimir Faster Glycol-Propyl Glycol (SYSTANE OP) Apply 1-2 drops to eye 3 (three) times daily as needed (dry eyes.).   Marland Kitchen potassium chloride (KLOR-CON) 10 MEQ tablet Take 1 tablet (10 mEq total) by mouth daily.  . sertraline (ZOLOFT) 100 MG tablet TAKE 2 TABLETS DAILY  . [DISCONTINUED] furosemide (LASIX) 20 MG tablet TAKE 1 TABLET DAILY     Allergies:   Alendronate  sodium, Alendronate sodium, Celecoxib, Omeprazole, Protonix [pantoprazole sodium], Rabeprazole, and Latex   Social History   Socioeconomic History  . Marital status: Widowed    Spouse name: Not on file  . Number of children: 3  . Years of education: Not on file  . Highest education level: Not on file  Occupational History  . Occupation: disabled    Employer: RETIRED  Tobacco Use  . Smoking status: Never Smoker  . Smokeless tobacco: Never Used  Vaping Use  . Vaping Use: Never used  Substance and Sexual Activity  . Alcohol use: No    Alcohol/week: 0.0 standard drinks  . Drug use: No  . Sexual activity: Never  Other Topics Concern  . Not on file  Social History Narrative  . Not on file   Social Determinants of Health   Financial Resource Strain: Not on file  Food Insecurity: Not on file  Transportation Needs: Not on file  Physical Activity: Not on file  Stress: Not on file  Social Connections: Not on file     Family History: The patient's family history includes Coronary artery disease in her brother; Diabetes in her brother and sister; Heart disease in her brother and father; Pancreatic cancer in her daughter. There is no history of Colon cancer, Colon polyps, Esophageal cancer, or Gallbladder disease.  ROS:   Please see the history of present illness.    All other systems reviewed and are negative.  EKGs/Labs/Other Studies Reviewed:    The following studies were reviewed today: Echo 01/27/2020: IMPRESSIONS    1. Left ventricular ejection fraction, by estimation, is 60 to 65%. The  left ventricle has normal function. The left ventricle has no regional  wall motion abnormalities. There is mild concentric left ventricular  hypertrophy. Left ventricular diastolic  parameters are consistent with Grade II diastolic dysfunction  (pseudonormalization).  2. Right ventricular systolic function is normal. The right ventricular  size is normal.  3. The mitral valve is  grossly normal. Trivial mitral valve  regurgitation.  4. The aortic valve is tricuspid. There is mild calcification of the  aortic valve. There is mild thickening of the aortic valve. Aortic valve  regurgitation is trivial. No aortic stenosis is present.   Comparison(s): A prior study was performed on 02/23/2018. Similar study-  new, trivial aortic regurgitation.   EKG:  EKG is not ordered today.    Recent Labs: 03/19/2020: ALT 18; TSH 2.17 06/06/2020: B Natriuretic Peptide 353.5; BUN 12; Creatinine, Ser 0.86; Hemoglobin 12.7; Platelets 215; Potassium 3.7; Sodium 143  Recent Lipid Panel    Component Value Date/Time   CHOL 163 03/19/2020 1223   TRIG 123.0 03/19/2020 1223   HDL 65.20 03/19/2020 1223   CHOLHDL 3 03/19/2020 1223   VLDL 24.6 03/19/2020 1223   LDLCALC 73 03/19/2020 1223     Risk Assessment/Calculations:       Physical Exam:    VS:  BP 116/60   Pulse 70   Ht 5\' 4"  (1.626 m)   Wt 195 lb (88.5 kg)   SpO2 96%   BMI 33.47 kg/m     Wt Readings from Last 3 Encounters:  06/11/20 195 lb (88.5 kg)  06/06/20 180 lb (81.6 kg)  05/11/20 193 lb 1 oz (87.6 kg)     GEN:  Well nourished, well developed in no acute distress HEENT: Normal NECK: No JVD; No carotid bruits LYMPHATICS: No lymphadenopathy CARDIAC: RRR, no murmurs, rubs, gallops RESPIRATORY:  Clear to auscultation without rales, wheezing or rhonchi  ABDOMEN: Soft, non-tender, non-distended MUSCULOSKELETAL:  No edema; No deformity.  There are varicosities and spider veins present but no edema in the legs. SKIN: Warm and dry NEUROLOGIC:  Alert and oriented x 3 PSYCHIATRIC:  Normal affect   ASSESSMENT:    1. Chronic diastolic heart failure (HCC)    PLAN:    In order of problems listed above:  1. The patient has grade 2 diastolic dysfunction on echo.  Her exam is unrevealing, but BNP was elevated on her emergency room visit.  I reviewed her chest x-ray which showed no evidence of pulmonary edema or  interstitial changes.  However, she has progressive symptoms and is short of breath  with fairly low level activity.  I have recommended alternating furosemide 20 mg daily with furosemide 40 mg every other day.  She will take K-Dur 10 mill equivalents daily.  I will repeat a metabolic panel in 3 to 4 weeks.  I like her to see an APP in follow-up in 6 months and I will see her back in 1 year.  As part of her evaluation today, I reviewed the emergency room records including laboratory data, EKG, and chest x-ray.        Medication Adjustments/Labs and Tests Ordered: Current medicines are reviewed at length with the patient today.  Concerns regarding medicines are outlined above.  Orders Placed This Encounter  Procedures  . Basic metabolic panel   Meds ordered this encounter  Medications  . potassium chloride (KLOR-CON) 10 MEQ tablet    Sig: Take 1 tablet (10 mEq total) by mouth daily.    Dispense:  90 tablet    Refill:  3  . furosemide (LASIX) 20 MG tablet    Sig: Take 20 mg (1 tablet) every other day. Alternate with 40 mg (2 tablets) every other day.    Dispense:  270 tablet    Refill:  3    Patient Instructions  Medication Instructions:  1) ALTERNATE LASIX 20 mg and 40 mg every other day 2) START KDUR (potassium supplement) 10 meq daily *If you need a refill on your cardiac medications before your next appointment, please call your pharmacy*  Lab Work: Your provider recommends that you return for lab work in [place]. You do not need to be fasting. You may come ANY TIME the day of your appointment between 7:30AM and 4:30PM. If you have labs (blood work) drawn today and your tests are completely normal, you will receive your results only by: Marland Kitchen MyChart Message (if you have MyChart) OR . A paper copy in the mail If you have any lab test that is abnormal or we need to change your treatment, we will call you to review the results.  Follow-Up: At [facility], you and your health needs  are our priority.  As part of our continuing mission to provide you with exceptional heart care, we have created designated Provider Care Teams.  These Care Teams include your primary Cardiologist (physician) and Advanced Practice Providers (APPs -  Physician Assistants and Nurse Practitioners) who all work together to provide you with the care you need, when you need it. Your next appointment:   6 month(s) The format for your next appointment:   In Person Provider:   You will see one of the following Advanced Practice Providers on your designated Care Team:    [name] Dopp, PA-C  Robbie Lis, PA-C  Then, Sherren Mocha, MD will plan to see you again in 1 year(s).     Signed, Sherren Mocha, MD  06/11/2020 1:25 PM    [facility]

## 2020-06-15 DIAGNOSIS — H353132 Nonexudative age-related macular degeneration, bilateral, intermediate dry stage: Secondary | ICD-10-CM | POA: Diagnosis not present

## 2020-06-19 DIAGNOSIS — D4981 Neoplasm of unspecified behavior of retina and choroid: Secondary | ICD-10-CM | POA: Diagnosis not present

## 2020-06-19 DIAGNOSIS — H52223 Regular astigmatism, bilateral: Secondary | ICD-10-CM | POA: Diagnosis not present

## 2020-06-19 DIAGNOSIS — Z23 Encounter for immunization: Secondary | ICD-10-CM | POA: Diagnosis not present

## 2020-06-19 DIAGNOSIS — H524 Presbyopia: Secondary | ICD-10-CM | POA: Diagnosis not present

## 2020-06-19 DIAGNOSIS — H43813 Vitreous degeneration, bilateral: Secondary | ICD-10-CM | POA: Diagnosis not present

## 2020-06-19 DIAGNOSIS — H353132 Nonexudative age-related macular degeneration, bilateral, intermediate dry stage: Secondary | ICD-10-CM | POA: Diagnosis not present

## 2020-06-19 DIAGNOSIS — H04123 Dry eye syndrome of bilateral lacrimal glands: Secondary | ICD-10-CM | POA: Diagnosis not present

## 2020-06-22 ENCOUNTER — Telehealth: Payer: Self-pay

## 2020-06-22 NOTE — Telephone Encounter (Signed)
Pt called to say she got her 1st Covid booster on 06-19-20 (Friday). Saturday she did not feel well. Yesterday (06-21-20) she started itching all over. Asking what to do about the itching. I suggested she get some Zyrtec if possible and try that until she hears back from Dr Glori Bickers. Told her if she has Benadryl she can take that, but she will not be able to drive as it can make her drowsy. She uses [facility]. and [place]. Please advise.

## 2020-06-22 NOTE — Telephone Encounter (Signed)
Zyrtec 10 mg is my preference.  Does she see a rash or just itching.  Watch for swelling of mouth or lips or throat or trouble breathing (call 911 if this occurs) and keep [place] posted  F/u if needed Thanks for letting me know

## 2020-06-22 NOTE — Telephone Encounter (Signed)
Pt said she only had benadryl at home so she has taken that instead of benadryl. Pt said the itching has resolved with benadryl. Pt advised med can be sedating so fall risk info given and advise no driving while on med. Pt verbalized understanding. Pt said she does have a rash all over, it's not just itching. Pt said it's mainly on chest, shoulders knees/behind knees but the rash is all over. Pt said it's red from her scratching so much, but since taking benadryl it has helped and she isn't scratching or itchy right now

## 2020-07-06 ENCOUNTER — Other Ambulatory Visit: Payer: Self-pay | Admitting: Family Medicine

## 2020-07-06 ENCOUNTER — Other Ambulatory Visit: Payer: Medicare Other

## 2020-07-06 ENCOUNTER — Other Ambulatory Visit: Payer: Self-pay

## 2020-07-06 DIAGNOSIS — I5032 Chronic diastolic (congestive) heart failure: Secondary | ICD-10-CM

## 2020-07-06 LAB — BASIC METABOLIC PANEL
BUN/Creatinine Ratio: 17 (ref 12–28)
BUN: 17 mg/dL (ref 8–27)
CO2: 25 mmol/L (ref 20–29)
Calcium: 9.2 mg/dL (ref 8.7–10.3)
Chloride: 102 mmol/L (ref 96–106)
Creatinine, Ser: 0.98 mg/dL (ref 0.57–1.00)
Glucose: 108 mg/dL — ABNORMAL HIGH (ref 65–99)
Potassium: 4.4 mmol/L (ref 3.5–5.2)
Sodium: 140 mmol/L (ref 134–144)
eGFR: 57 mL/min/{1.73_m2} — ABNORMAL LOW (ref >59)

## 2020-07-10 ENCOUNTER — Other Ambulatory Visit: Payer: Self-pay | Admitting: Family Medicine

## 2020-07-15 ENCOUNTER — Ambulatory Visit: Payer: Medicare Other | Admitting: Gastroenterology

## 2020-07-15 DIAGNOSIS — H353132 Nonexudative age-related macular degeneration, bilateral, intermediate dry stage: Secondary | ICD-10-CM | POA: Diagnosis not present

## 2020-07-22 DIAGNOSIS — I5032 Chronic diastolic (congestive) heart failure: Secondary | ICD-10-CM

## 2020-07-22 DIAGNOSIS — Z79899 Other long term (current) drug therapy: Secondary | ICD-10-CM

## 2020-07-23 NOTE — Telephone Encounter (Signed)
Spoke with the patient, no other potential source for rash found. Will stop potassium in hopes that rash resolves. Scheduled lab to recheck potassium. Patient in agreement and verbalized understanding.

## 2020-07-24 NOTE — Telephone Encounter (Signed)
Thank you - agree with plan.

## 2020-08-06 ENCOUNTER — Other Ambulatory Visit: Payer: Medicare Other

## 2020-08-06 ENCOUNTER — Other Ambulatory Visit: Payer: Self-pay

## 2020-08-06 DIAGNOSIS — I5032 Chronic diastolic (congestive) heart failure: Secondary | ICD-10-CM

## 2020-08-06 DIAGNOSIS — Z79899 Other long term (current) drug therapy: Secondary | ICD-10-CM

## 2020-08-06 LAB — BASIC METABOLIC PANEL
BUN/Creatinine Ratio: 17 (ref 12–28)
BUN: 16 mg/dL (ref 8–27)
CO2: 24 mmol/L (ref 20–29)
Calcium: 9.2 mg/dL (ref 8.7–10.3)
Chloride: 102 mmol/L (ref 96–106)
Creatinine, Ser: 0.95 mg/dL (ref 0.57–1.00)
Glucose: 98 mg/dL (ref 65–99)
Potassium: 4.4 mmol/L (ref 3.5–5.2)
Sodium: 141 mmol/L (ref 134–144)
eGFR: 59 mL/min/{1.73_m2} — ABNORMAL LOW (ref >59)

## 2020-08-18 ENCOUNTER — Other Ambulatory Visit: Payer: Self-pay | Admitting: Family Medicine

## 2020-09-08 ENCOUNTER — Ambulatory Visit: Payer: Medicare Other | Admitting: Gastroenterology

## 2020-09-13 DIAGNOSIS — H353132 Nonexudative age-related macular degeneration, bilateral, intermediate dry stage: Secondary | ICD-10-CM | POA: Diagnosis not present

## 2020-09-15 ENCOUNTER — Other Ambulatory Visit: Payer: Self-pay | Admitting: Family Medicine

## 2020-09-23 ENCOUNTER — Telehealth: Payer: Self-pay

## 2020-09-23 MED ORDER — MOLNUPIRAVIR EUA 200MG CAPSULE: 4.0000 | ORAL_CAPSULE | Freq: Two times a day (BID) | ORAL | 0 refills | Status: AC

## 2020-09-23 NOTE — Telephone Encounter (Signed)
Paxlovid has drug interactions for her and would also require lab work.  I prefer molnupiravir  I pended it to her pharmacy of choice Side effect-possible loose stool  If any intolerable side effect let me know  Keep [place] posted If worse-needs UC (ER if severe or sob) Drink lots of fluids

## 2020-09-23 NOTE — Telephone Encounter (Signed)
Pt was cold transferred to [facility] per Caryl Pina C. And I spoke with pt; pt understood from access nurse that pt could get a paxlovid rx sent to [facility] due to pt being high risk. Pt tested + covid on 09/23/20 with home test. Pt said she lives with her son and he tested + for covid on 09/21/20. Pt said symptoms started for her on 09/22/20 with tiredness. Today 09/23/20 pt continues with feeling really tired, runny nose, pt feels like heavy chest but not CP but SOB upon exertion. Pt does not have SOB when sitting or resting. Pt is on O2 2L at nighttime. Pt also has body aches, H/A with pain level of 8. Pt has prod cough with white phlegm that alternates with a dry cough also. Pt has scratchy S/T today but no difficulty in swallowing. Pt is not SOB while talking on phone. Pt has had 2 covid vaccines and last covid booster was over 1 month ago and pt had rash on torso but did not have difficulty breathing and no swelling in throat. Pt has temp now of 97.2 P 72 Pulse ox 96%. Pt does not have way to ck BP.  Pt does not have fever, chills, diarrhea or vomiting. And no loss of taste or smell and no head or sinus congestion. There are no available appts today at [facility] and pt is not confident of doing video visit. I spoke with Dr Glori Bickers and she said to send note to her and she will review and pt will get cb. Pt voiced understanding. Self quarantine, drink plenty of fluids, rest, and take Tylenol for fever. UC & ED precautions given and pt voiced understanding. [facility].

## 2020-09-23 NOTE — Telephone Encounter (Signed)
Pt notified of DR. Tower's comments and Rx sent to pharmacy. ER precautions given

## 2020-09-23 NOTE — Telephone Encounter (Signed)
[facility] - Client TELEPHONE ADVICE RECORD AccessNurse Patient Name: [name] [name] Gender: Female DOB: [birth year] Age: 86 Y 15 M 7 D Return Phone Number: [phone] (Primary) Address[place] [address] Client [facility] - Client Client Site [place] MD Contact Type Call Who Is Calling Patient / Member / Family / Caregiver Call Type Triage / Clinical Relationship To Patient Self Return Phone Number [phone] (Primary) Chief Complaint Cough Reason for Call Symptomatic / Request for [place] states, pt pos. covid 19. Pt has cough and congestion, runny nose, and tired. Pt is 96 02. Translation No Nurse Assessment Nurse: D'Heur Lucia Gaskins, RN, Adrienne Date/Time (Eastern Time): 09/23/2020 10:41:43 AM Confirm and document reason for call. If symptomatic, describe symptoms. ---Caller states she is positive for covid 19 today w/ home test. Pt has cough and congestion, runny nose, sore throat and fatigue. Temp is 97.2 forehead. S/S started yesterday. O2SAT is 93%, pulse is 59. Does the patient have any new or worsening symptoms? ---Yes Will a triage be completed? ---Yes Related visit to physician within the last 2 weeks? ---No Does the PT have any chronic conditions? (i.e. diabetes, asthma, this includes High risk factors for pregnancy, etc.) ---Yes List chronic conditions. ---Depression, acid reflux, Is this a behavioral health or substance abuse call? ---No Guidelines Guideline Title Affirmed Question Affirmed Notes Nurse Date/Time ([place] Time) COVID-19 - Diagnosed or Suspected [1] HIGH RISK for severe COVID complications (e.g., weak immune system, age > 59 years, obesity with BMI 30 or higher, pregnant, chronic lung disease or other chronic medical D'Heur Lucia Gaskins, RN, Vincente Liberty 09/23/2020  10:46:45 AM PLEASE NOTE: All timestamps contained within this report are represented as [place] Standard Time. CONFIDENTIALTY NOTICE: This fax transmission is intended only for the addressee. It contains information that is legally privileged, confidential or otherwise protected from use or disclosure. If you are not the intended recipient, you are strictly prohibited from reviewing, disclosing, copying using or disseminating any of this information or taking any action in reliance on or regarding this information. If you have received this fax in error, please notify us immediately by telephone so that we can arrange for its return to [place]. Phone: [phone], Toll-Free: [phone], Fax: [phone] Page: 2 of 2 Call Id: [id number] Guidelines Guideline Title Affirmed Question Affirmed Notes Nurse Date/Time Eilene Ghazi Time) condition) AND [2] COVID symptoms (e.g., cough, fever) (Exceptions: Already seen by PCP and no new or worsening symptoms.) Disp. Time Eilene Ghazi Time) Disposition Final User 09/23/2020 10:56:18 AM Call PCP within 24 Hours Yes D'Heur Lucia Gaskins, RN, Vincente Liberty Caller Disagree/Comply Comply Caller Understands Yes PreDisposition Call Doctor Care Advice Given Per Guideline CALL PCP WITHIN 24 HOURS: * You need to discuss this with your doctor (or NP/PA) within the next 24 hours. * Feeling dehydrated: Drink extra liquids. If the air in your home is dry, use a humidifier. * HOME REMEDY - HONEY: This old home remedy has been shown to help decrease coughing at night. The adult dosage is 2 teaspoons (10 ml) at bedtime. CALL BACK IF: * You become worse CARE ADVICE given per COVID-19 - DIAGNOSED OR SUSPECTED (Adult) guideline. Comments User: Vincente Liberty, D'Heur Lucia Gaskins, RN Date/Time Eilene Ghazi Time): 09/23/2020 10:47:26 AM Caller uses O2 at night. User: Vincente Liberty, D'Heur Lucia Gaskins, RN Date/Time Eilene Ghazi Time): 09/23/2020 10:50:25 AM Caller had pain in her chest yesterday. She thinks it was gas. She  also  feels pain when she coughs. User: Vincente Liberty, D'Heur Lucia Gaskins, RN Date/Time Eilene Ghazi Time): 09/23/2020 11:01:27 AM Warm transferred to office as per disposition.

## 2020-09-30 ENCOUNTER — Other Ambulatory Visit: Payer: Self-pay | Admitting: Family Medicine

## 2020-09-30 DIAGNOSIS — Z1231 Encounter for screening mammogram for malignant neoplasm of breast: Secondary | ICD-10-CM

## 2020-10-13 DIAGNOSIS — H353132 Nonexudative age-related macular degeneration, bilateral, intermediate dry stage: Secondary | ICD-10-CM | POA: Diagnosis not present

## 2020-10-14 ENCOUNTER — Telehealth: Payer: Self-pay

## 2020-10-14 NOTE — Telephone Encounter (Signed)
Patient called stating she dropped off a form to be filled out by Dr Glori Bickers for [employer] on 10/12/20 and wanted to see if this has been looked at yet? She needs this sooner than later to turn in please. Also, would like to know if her friend Neville Route can pick it up for her because patient does not drive out there on her own and has to find a ride. Thank you

## 2020-10-14 NOTE — Telephone Encounter (Signed)
Called pt and answered questions and placed at the front for pick up, copy sent to scanning

## 2020-10-14 NOTE — Telephone Encounter (Signed)
I put it in [place]'s IN box to review, it had a few more questions to fill in and it will be ready

## 2020-10-19 DIAGNOSIS — D3132 Benign neoplasm of left choroid: Secondary | ICD-10-CM | POA: Diagnosis not present

## 2020-10-19 DIAGNOSIS — H353112 Nonexudative age-related macular degeneration, right eye, intermediate dry stage: Secondary | ICD-10-CM | POA: Diagnosis not present

## 2020-10-19 DIAGNOSIS — H353123 Nonexudative age-related macular degeneration, left eye, advanced atrophic without subfoveal involvement: Secondary | ICD-10-CM | POA: Diagnosis not present

## 2020-10-19 DIAGNOSIS — H43813 Vitreous degeneration, bilateral: Secondary | ICD-10-CM | POA: Diagnosis not present

## 2020-11-04 ENCOUNTER — Ambulatory Visit: Payer: Medicare Other

## 2020-11-06 DIAGNOSIS — Z23 Encounter for immunization: Secondary | ICD-10-CM | POA: Diagnosis not present

## 2020-11-09 ENCOUNTER — Other Ambulatory Visit: Payer: Self-pay | Admitting: Family Medicine

## 2020-11-12 DIAGNOSIS — H353132 Nonexudative age-related macular degeneration, bilateral, intermediate dry stage: Secondary | ICD-10-CM | POA: Diagnosis not present

## 2020-11-18 ENCOUNTER — Other Ambulatory Visit: Payer: Self-pay | Admitting: Cardiovascular Disease

## 2020-11-18 NOTE — Telephone Encounter (Signed)
Spoke with local pharmacy and was informed the patient last refill both Lasix and metoprolol in July.   I called the patient who [facility]   Rx(s) sent to pharmacy electronically.  Patient voiced understanding.

## 2020-11-24 ENCOUNTER — Other Ambulatory Visit: Payer: Self-pay

## 2020-11-24 ENCOUNTER — Encounter: Payer: Self-pay | Admitting: Family Medicine

## 2020-11-24 ENCOUNTER — Telehealth (INDEPENDENT_AMBULATORY_CARE_PROVIDER_SITE_OTHER): Payer: Medicare Other | Admitting: Family Medicine

## 2020-11-24 ENCOUNTER — Telehealth: Payer: Self-pay

## 2020-11-24 VITALS — HR 63

## 2020-11-24 DIAGNOSIS — J40 Bronchitis, not specified as acute or chronic: Secondary | ICD-10-CM

## 2020-11-24 MED ORDER — AMOXICILLIN-POT CLAVULANATE 875-125 MG PO TABS: 1.0000 | ORAL_TABLET | Freq: Two times a day (BID) | ORAL | 0 refills | Status: AC

## 2020-11-24 NOTE — Patient Instructions (Addendum)
Continue to take over the counter medication as needed  Take antibiotics  Call if new fevers, chills, worsening breathing or cough  Return if cough does not resolve by 6 weeks.

## 2020-11-24 NOTE — Telephone Encounter (Signed)
See note from today

## 2020-11-24 NOTE — Telephone Encounter (Signed)
Per appt notes pt already has a video visit scheduled for 11/24/20 at 2:40. If pt worsens care advice given in access nurse note. Sending note to Dr Einar Pheasant and [facility] CMA. Will send FYI to Dr Glori Bickers as PCP. Will also teams [place].

## 2020-11-24 NOTE — Progress Notes (Signed)
I connected with [name] on 11/24/20 at  2:40 PM EST by video and verified that I am speaking with the correct person using two identifiers.   I discussed the limitations, risks, security and privacy concerns of performing an evaluation and management service by video and the availability of in person appointments. I also discussed with the patient that there may be a patient responsible charge related to this service. The patient expressed understanding and agreed to proceed.  Patient location: Home Provider Location: [place] Participants: [name] and Cydni Reddoch [facility]   Subjective:     [name] [name] is a 86 y.o. female presenting for Cough (With a lot of congestion in chest ), Nasal Congestion, and Shortness of Breath (Upon exertion/talking)     Cough This is a new problem. The current episode started 1 to 4 weeks ago. The problem has been gradually worsening. The cough is Productive of purulent sputum. Associated symptoms include chest pain (just with cough), nasal congestion and shortness of breath. Pertinent negatives include no chills, ear congestion, ear pain, fever, headaches, myalgias, sore throat or wheezing. She has tried OTC cough suppressant (mucinex) for the symptoms. The treatment provided moderate relief.  Shortness of Breath Associated symptoms include chest pain (just with cough). Pertinent negatives include no ear pain, fever, headaches, sore throat, vomiting or wheezing.   On oxygen at night - due to CHF No leg swelling  Review of Systems  Constitutional:  Negative for chills and fever.  HENT:  Negative for ear pain and sore throat.   Respiratory:  Positive for cough and shortness of breath. Negative for wheezing.   Cardiovascular:  Positive for chest pain (just with cough).  Gastrointestinal:  Positive for diarrhea (this morning). Negative for nausea and vomiting.  Musculoskeletal:  Negative for arthralgias and myalgias.   Neurological:  Negative for headaches.    Social History   Tobacco Use  Smoking Status Never  Smokeless Tobacco Never        Objective:   BP Readings from Last 3 Encounters:  06/11/20 116/60  06/06/20 (!) 112/42  05/11/20 110/62   Wt Readings from Last 3 Encounters:  06/11/20 195 lb (88.5 kg)  06/06/20 180 lb (81.6 kg)  05/11/20 193 lb 1 oz (87.6 kg)     Pulse 63   SpO2 95%   Physical Exam Constitutional:      Appearance: Normal appearance. She is not ill-appearing.  HENT:     Head: Normocephalic and atraumatic.     Right Ear: External ear normal.     Left Ear: External ear normal.  Eyes:     Conjunctiva/sclera: Conjunctivae normal.  Pulmonary:     Effort: Pulmonary effort is normal. No respiratory distress.     Comments: Thick cough noted throughout visit Neurological:     Mental Status: She is alert. Mental status is at baseline.  Psychiatric:        Mood and Affect: Mood normal.        Behavior: Behavior normal.        Thought Content: Thought content normal.        Judgment: Judgment normal.          Assessment & Plan:   Problem List Items Addressed This Visit   None Visit Diagnoses     Bronchitis    -  Primary   Relevant Medications   amoxicillin-clavulanate (AUGMENTIN) 875-125 MG tablet      Overall well appearing but  with thick sounding cough. Given prior nasal congestion and persistence of symptoms will treat for possible sinus infection vs pneumonia.   Discussed return precautions and that the cough could linger for 6 weeks but if persisting would recommend f/u and consideration for CXR     Return if symptoms worsen or fail to improve.  [name], MD

## 2020-11-24 NOTE — Telephone Encounter (Signed)
Aware, will watch for correspondence  

## 2020-11-24 NOTE — Telephone Encounter (Signed)
[facility] - Client TELEPHONE ADVICE RECORD [facility] Patient Name: [name] [name] Gender: Female DOB: 1934/04/01 Age: 86 Y 10 M 8 D Return Phone Number: [phone] (Primary) [place]  [address] Client [facility] - Client Client Site [place] MD Contact Type Call Who Is Calling Patient / Member / Family / Caregiver Call Type Triage / Clinical Relationship To Patient Self Return Phone Number [phone] (Primary) Chief Complaint BREATHING - shortness of breath or sounds breathless Reason for Call Symptomatic / Request for [place] states they have PT that needs to be triaged. Had bad chest cold for 2 weeks that started as head cold, congestion, coughing up grey thick mucus. No pain and no fever. Sometimes cannot catch breath from coughing Translation No Nurse Assessment Nurse: Malena Peer, RN, Edwena Felty Date/Time (Eastern Time): 11/24/2020 10:38:26 AM Confirm and document reason for call. If symptomatic, describe symptoms. ---Caller states she has had a cold for the past 2 weeks that started as head cold. She states she now has chest congestion and occasional cough but then coughs up deep gray colored mucous. Denies any fever. She gets short of breath when she is coughing. She was negative for covid with a rapid home test on 11/22/20. Does the patient have any new or worsening symptoms? ---Yes Will a triage be completed? ---Yes Related visit to physician within the last 2 weeks? ---No Does the PT have any chronic conditions? (i.e. diabetes, asthma, this includes High risk factors for pregnancy, etc.) ---Yes List chronic conditions. ---Caller is on O2 during sleep Is this a behavioral health or substance abuse call? ---No Guidelines Guideline Title Affirmed Question Affirmed Notes Nurse  Date/Time (Eastern Time) Cough - Acute Productive SEVERE coughing spells (e.g., whooping sound after coughing, Strick, RN, Edwena Felty 11/24/2020 10:42:32 AM PLEASE NOTE: All timestamps contained within this report are represented as [place] Standard Time. CONFIDENTIALTY NOTICE: This fax transmission is intended only for the addressee. It contains information that is legally privileged, confidential or otherwise protected from use or disclosure. If you are not the intended recipient, you are strictly prohibited from reviewing, disclosing, copying using or disseminating any of this information or taking any action in reliance on or regarding this information. If you have received this fax in error, please notify us immediately by telephone so that we can arrange for its return to [place]. Phone: [phone], Toll-Free: [phone], Fax: [phone] Page: 2 of 2 Call Id: [id number] Guidelines Guideline Title Affirmed Question Affirmed Notes Nurse Date/Time Eilene Ghazi Time) vomiting after coughing) Disp. Time Eilene Ghazi Time) Disposition Final User 11/24/2020 10:36:36 AM Send to Urgent Queue Ian Malkin 11/24/2020 10:50:54 AM See PCP within 24 Hours Yes Malena Peer, RN, Osie Cheeks Disagree/Comply Disagree Caller Understands Yes PreDisposition Call Doctor Care Advice Given Per Guideline SEE PCP WITHIN 24 HOURS: * IF OFFICE WILL BE OPEN: You need to be examined within the next 24 hours. Call your doctor (or NP/PA) when the office opens and make an appointment. COUGHING SPELLS: * Drink warm fluids. Inhale warm mist. This can help relax the airway and also loosen up phlegm. * Suck on cough drops or hard candy to coat the irritated throat. COUGH MEDICINES: * COUGH DROPS: Over-the-counter cough drops can help a lot, especially for mild coughs. They soothe an irritated throat and remove the tickle sensation in the back of the throat. Cough drops are easy to carry  with you. * COUGH SYRUP WITH  DEXTROMETHORPHAN: An over-the-counter cough syrup can help your cough. The most common cough suppressant in over-the-counter cough medicines is dextromethorphan. * HOME REMEDY - HONEY: This old home remedy has been shown to help decrease coughing at night. The adult dosage is 2 teaspoons (10 ml) at bedtime. * HOME REMEDY - HARD CANDY: Hard candy works just as well as over-the-counter cough drops. People who have diabetes should use sugar-free candy. COUGH SYRUP WITH DEXTROMETHORPHAN: * Do not try to completely stop coughs that produce mucus and phlegm. COUGH SYRUP WITH DEXTROMETHORPHAN - EXTRA NOTES AND WARNINGS: HUMIDIFIER: * If the air is dry, use a humidifier in the bedroom. * Dry air makes coughs worse. AVOID TOBACCO SMOKE: * Avoid tobacco smoke and e-cigarettes. * Smoking or being exposed to smoke makes coughs much worse. CALL BACK IF: * Difficulty breathing occurs * You become worse CARE ADVICE given per Cough - Acute Productive (Adult) guideline. Referrals GO TO FACILITY REFUSE

## 2020-12-12 DIAGNOSIS — H353132 Nonexudative age-related macular degeneration, bilateral, intermediate dry stage: Secondary | ICD-10-CM | POA: Diagnosis not present

## 2020-12-27 NOTE — Progress Notes (Deleted)
Cardiology Office Note:    Date:  12/27/2020   ID:  [name] [name], DOB [birth year], MRN [id number]  PCP:  Abner Greenspan, MD   [facility] Cardiologist:  Sherren Mocha, MD { Click to update primary MD,subspecialty MD or APP then REFRESH:1}    Referring MD: Tower, Wynelle Fanny, MD   No chief complaint on file. ***  History of Present Illness:    [name] [name] is a 86 y.o. female with a hx of HFpEF, GERD, hyperlipidemia, CVA, aortic atherosclerosis,   She was last seen in our office by Dr. Burt Knack on 06/11/20 for worsening shortness of breath. She was advised to increase furosemide to alternate 40 mg every other day with 20 mg.  Developed a rash thought to be r/t potassium   Today, she is here   CVA or TIA on chronic plavix prescribed by pcp   Past Medical History:  Diagnosis Date   Alternating constipation and diarrhea    Anxiety state, unspecified    Complication of anesthesia    slow to wake up with surgery several years ago    COVID-19 virus infection 12/2018   Depressive disorder, not elsewhere classified    Diaphragmatic hernia without mention of obstruction or gangrene    Difficulty sleeping    Diverticulosis of colon (without mention of hemorrhage)    Esophageal reflux    External hemorrhoids without mention of complication    Fatigue    Frequency of urination    Helicobacter pylori (H. pylori)    Hiatal hernia    History of transfusion    Lumbar spondylosis    Macular degeneration (senile) of retina, unspecified    Osteoarthrosis, unspecified whether generalized or localized, unspecified site 08-11-11   hx. "rhematoid arthritis", osteoarthritis, DDD, bursitis(hip)   Osteoporosis    Palpitations    PONV (postoperative nausea and vomiting)    Schatzki's ring    Shortness of breath 08-11-11   with exertion only at present   Unspecified cerebral artery occlusion with cerebral infarction 08-11-11   '97/ '06( TIA)-affected lt. side, slight  weakness L side   Unspecified vitamin D deficiency     Past Surgical History:  Procedure Laterality Date   ABDOMINAL HYSTERECTOMY  08-11-11   APPENDECTOMY     BACK SURGERY  08-11-11   '11-hx. lumbar fusion with retained hardware   CATARACT EXTRACTION  08-11-11   Bilateral   ESOPHAGEAL DILATION     KNEE SURGERY  08-11-11   rt. knee scope   PATELLAR TENDON REPAIR Left 02/22/2014   Procedure: PATELLA TENDON REPAIR;  Surgeon: Gearlean Alf, MD;  Location: [facility];  Service: Orthopedics;  Laterality: Left;   PATELLAR TENDON REPAIR Left 06/25/2014   Procedure: LEFT PATELLA TENDON REPAIR;  Surgeon: Gaynelle Arabian, MD;  Location: [facility];  Service: Orthopedics;  Laterality: Left;   TOTAL KNEE ARTHROPLASTY  08/16/2011   Procedure: TOTAL KNEE ARTHROPLASTY;  Surgeon: Gearlean Alf, MD;  Location: [facility];  Service: Orthopedics;  Laterality: Right;   TOTAL KNEE ARTHROPLASTY Left 01/27/2014   Procedure: LEFT TOTAL KNEE ARTHROPLASTY;  Surgeon: Gearlean Alf, MD;  Location: [facility];  Service: Orthopedics;  Laterality: Left;   TUBAL LIGATION      Current Medications: No outpatient medications have been marked as taking for the 12/28/20 encounter (Appointment) with Charlie Pitter, PA-C.     Allergies:   Alendronate sodium, Alendronate sodium, Celecoxib, Omeprazole, Protonix [pantoprazole sodium], Rabeprazole, and Latex   Social History  Socioeconomic History   Marital status: Widowed    Spouse name: Not on file   Number of children: 3   Years of education: Not on file   Highest education level: Not on file  Occupational History   Occupation: disabled    Employer: RETIRED  Tobacco Use   Smoking status: Never   Smokeless tobacco: Never  Vaping Use   Vaping Use: Never used  Substance and Sexual Activity   Alcohol use: No    Alcohol/week: 0.0 standard drinks   Drug use: No   Sexual activity: Never  Other Topics Concern   Not on file  Social History Narrative   Not on file   Social  Determinants of Health   Financial Resource Strain: Not on file  Food Insecurity: Not on file  Transportation Needs: Not on file  Physical Activity: Not on file  Stress: Not on file  Social Connections: Not on file     Family History: The patient's ***family history includes Coronary artery disease in her brother; Diabetes in her brother and sister; Heart disease in her brother and father; Pancreatic cancer in her daughter. There is no history of Colon cancer, Colon polyps, Esophageal cancer, or Gallbladder disease.  ROS:   Please see the history of present illness.    *** All other systems reviewed and are negative.  Labs/Other Studies Reviewed:    The following studies were reviewed today:  Echo 01/27/20  Left Ventricle: Left ventricular ejection fraction, by estimation, is 60  to 65%. The left ventricle has normal function. The left ventricle has no  regional wall motion abnormalities. The left ventricular internal cavity  size was normal in size. There is  mild concentric left ventricular hypertrophy. Left ventricular diastolic parameters are consistent with Grade II diastolic dysfunction  (pseudonormalization).  Right Ventricle: The right ventricular size is normal. No increase in  right ventricular wall thickness. Right ventricular systolic function is  normal.  Left Atrium: Left atrial size was normal in size.  Right Atrium: Right atrial size was normal in size.  Pericardium: There is no evidence of pericardial effusion.  Mitral Valve: The mitral valve is grossly normal. Mild mitral annular  calcification. Trivial mitral valve regurgitation.  Tricuspid Valve: The tricuspid valve is grossly normal. Tricuspid valve  regurgitation is trivial.  Aortic Valve: The aortic valve is tricuspid. There is mild calcification  of the aortic valve. There is mild thickening of the aortic valve. There  is mild aortic valve annular calcification. Aortic valve regurgitation is  trivial.  No aortic stenosis is  present.  Pulmonic Valve: The pulmonic valve was grossly normal. Pulmonic valve regurgitation is trivial.  Aorta: The ascending aorta was not well visualized and the aortic root and ascending aorta are structurally normal, with no evidence of dilitation.  Venous: The inferior vena cava was not well visualized.  IAS/Shunts: The atrial septum is grossly normal.   02/23/18  Nuclear stress EF: 69%. Normal perfusion The study is normal. This is a low risk study.  Recent Labs: 03/19/2020: ALT 18; TSH 2.17 06/06/2020: B Natriuretic Peptide 353.5; Hemoglobin 12.7; Platelets 215 08/06/2020: BUN 16; Creatinine, Ser 0.95; Potassium 4.4; Sodium 141  Recent Lipid Panel    Component Value Date/Time   CHOL 163 03/19/2020 1223   TRIG 123.0 03/19/2020 1223   HDL 65.20 03/19/2020 1223   CHOLHDL 3 03/19/2020 1223   VLDL 24.6 03/19/2020 1223   LDLCALC 73 03/19/2020 1223     Risk Assessment/Calculations:   {Does this  patient have ATRIAL FIBRILLATION?[phone]}       Physical Exam:    VS:  There were no vitals taken for this visit.    Wt Readings from Last 3 Encounters:  06/11/20 195 lb (88.5 kg)  06/06/20 180 lb (81.6 kg)  05/11/20 193 lb 1 oz (87.6 kg)     GEN: *** Well nourished, well developed in no acute distress HEENT: Normal NECK: No JVD; No carotid bruits LYMPHATICS: No lymphadenopathy CARDIAC: ***RRR, no murmurs, rubs, gallops RESPIRATORY:  Clear to auscultation without rales, wheezing or rhonchi  ABDOMEN: Soft, non-tender, non-distended MUSCULOSKELETAL:  No edema; No deformity  SKIN: Warm and dry NEUROLOGIC:  Alert and oriented x 3 PSYCHIATRIC:  Normal affect   EKG:  EKG is *** ordered today.  The ekg ordered today demonstrates ***  Diagnoses:    No diagnosis found. Assessment and Plan:     ***      {Are you ordering a CV Procedure (e.g. stress test, cath, DCCV, TEE, etc)?   Press F2        :[id number]}    Medication Adjustments/Labs and  Tests Ordered: Current medicines are reviewed at length with the patient today.  Concerns regarding medicines are outlined above.  No orders of the defined types were placed in this encounter.  No orders of the defined types were placed in this encounter.   There are no Patient Instructions on file for this visit.   Signed, Emmaline Life, NP  12/27/2020 8:21 PM    [place]

## 2020-12-28 ENCOUNTER — Ambulatory Visit: Payer: Medicare Other | Admitting: Physician Assistant

## 2020-12-28 DIAGNOSIS — L308 Other specified dermatitis: Secondary | ICD-10-CM | POA: Diagnosis not present

## 2021-01-04 ENCOUNTER — Other Ambulatory Visit: Payer: Self-pay | Admitting: Family Medicine

## 2021-01-11 DIAGNOSIS — H353132 Nonexudative age-related macular degeneration, bilateral, intermediate dry stage: Secondary | ICD-10-CM | POA: Diagnosis not present

## 2021-01-22 ENCOUNTER — Other Ambulatory Visit: Payer: Self-pay | Admitting: Family Medicine

## 2021-02-08 ENCOUNTER — Other Ambulatory Visit: Payer: Self-pay | Admitting: Family Medicine

## 2021-02-08 MED ORDER — GABAPENTIN 100 MG PO CAPS: 200.0000 mg | ORAL_CAPSULE | Freq: Three times a day (TID) | ORAL | 1 refills | Status: DC

## 2021-02-08 NOTE — Addendum Note (Signed)
Addended by: Tammi Sou on: 02/08/2021 03:02 PM   Modules accepted: Orders

## 2021-02-08 NOTE — Telephone Encounter (Signed)
°  Encourage patient to contact the pharmacy for refills or they can request refills through [facility]:  Please schedule appointment if longer than 1 year  NEXT APPOINTMENT DATE:  MEDICATION:gabapentin (NEURONTIN) 100 MG capsule  Is the patient out of medication? yes  IDCVUDTH:YHOOILN [place], [place]  Let patient know to contact pharmacy at the end of the day to make sure medication is ready.  Please notify patient to allow 48-72 hours to process  CLINICAL FILLS OUT ALL BELOW:   LAST REFILL:  QTY:  REFILL DATE:    OTHER COMMENTS:    Okay for refill?  Please advise

## 2021-02-08 NOTE — Telephone Encounter (Signed)
Pt had a recent acute appt with another provider but last CPE was on 05/11/20, last filled was on 05/11/20 #540 caps with 1 refill, please advise   [facility]

## 2021-02-08 NOTE — Addendum Note (Signed)
Addended by: Loura Pardon A on: 02/08/2021 03:12 PM   Modules accepted: Orders

## 2021-02-10 DIAGNOSIS — H353132 Nonexudative age-related macular degeneration, bilateral, intermediate dry stage: Secondary | ICD-10-CM | POA: Diagnosis not present

## 2021-02-16 ENCOUNTER — Encounter: Payer: Self-pay | Admitting: Physician Assistant

## 2021-02-16 ENCOUNTER — Other Ambulatory Visit: Payer: Self-pay

## 2021-02-16 ENCOUNTER — Ambulatory Visit (INDEPENDENT_AMBULATORY_CARE_PROVIDER_SITE_OTHER): Payer: Medicare Other | Admitting: Physician Assistant

## 2021-02-16 VITALS — BP 102/50 | HR 71 | Ht 65.0 in | Wt 198.8 lb

## 2021-02-16 DIAGNOSIS — I5032 Chronic diastolic (congestive) heart failure: Secondary | ICD-10-CM | POA: Diagnosis not present

## 2021-02-16 DIAGNOSIS — R002 Palpitations: Secondary | ICD-10-CM

## 2021-02-16 DIAGNOSIS — E782 Mixed hyperlipidemia: Secondary | ICD-10-CM | POA: Diagnosis not present

## 2021-02-16 DIAGNOSIS — R0602 Shortness of breath: Secondary | ICD-10-CM | POA: Diagnosis not present

## 2021-02-16 NOTE — Progress Notes (Signed)
Office Visit    Patient Name: [name] [name] Date of Encounter: 02/16/2021  PCP:  Abner Greenspan, MD   [place]  Cardiologist:  Sherren Mocha, MD  Advanced Practice Provider:  No care team member to display Electrophysiologist:  None    HPI    [name] [name] is a 87 y.o. female with a hx of palpitations, history of TIA, chronic shortness of breath, heart failure with preserved ejection fraction, aortic atherosclerosis, hyperlipidemia, DJD, anxiety, GERD, macular degeneration, osteoporosis, and history of COVID-19 infection 12/2018 presents today for 93-month follow-up appointment.  She was in the emergency room before her last office visit sprain of 2022 for progressive shortness of breath. This has been a chronic complaint but she had been more short of breath with activity in the past. She had mild orthopnea.  She had been compliant with her furosemide 20 mg daily.  She denied any chest pain or pressure, lightheadedness, or heart palpitations.  She complained of fatigue and "brain fog".   Today, she states today that she has had no energy and shortness of breath which started a couple of months ago.  Her blood pressure is borderline low today at 102/50.  She states that she has a cup of coffee in the morning, an energy drink midmorning, and drinks 2 gallons of sweet tea a week.  She does not drink any water.  I encouraged her to decrease her sweet tea consumption and replace with water.  I also advised her against energy drinks since they can cause tachycardia and other heart related issues.  We have decided to order some labs today since she was recently started back on a daily diuretic of Lasix 20 mg.  She also is on low-dose metoprolol succinate for her past history of palpitations.  She denies any chest pain, syncope, or near syncope.  She has not had any dizziness or lightheadedness.  Reports no chest pain, pressure, or tightness. No edema, orthopnea,  PND. Reports no palpitations.    Past Medical History    Past Medical History:  Diagnosis Date   Alternating constipation and diarrhea    Anxiety state, unspecified    Complication of anesthesia    slow to wake up with surgery several years ago    COVID-19 virus infection 12/2018   Depressive disorder, not elsewhere classified    Diaphragmatic hernia without mention of obstruction or gangrene    Difficulty sleeping    Diverticulosis of colon (without mention of hemorrhage)    Esophageal reflux    External hemorrhoids without mention of complication    Fatigue    Frequency of urination    Helicobacter pylori (H. pylori)    Hiatal hernia    History of transfusion    Lumbar spondylosis    Macular degeneration (senile) of retina, unspecified    Osteoarthrosis, unspecified whether generalized or localized, unspecified site 08-11-11   hx. "rhematoid arthritis", osteoarthritis, DDD, bursitis(hip)   Osteoporosis    Palpitations    PONV (postoperative nausea and vomiting)    Schatzki's ring    Shortness of breath 08-11-11   with exertion only at present   Unspecified cerebral artery occlusion with cerebral infarction 08-11-11   '97/ '06( TIA)-affected lt. side, slight weakness L side   Unspecified vitamin D deficiency    Past Surgical History:  Procedure Laterality Date   ABDOMINAL HYSTERECTOMY  08-11-11   APPENDECTOMY     BACK SURGERY  08-11-11   '11-hx. lumbar  fusion with retained hardware   CATARACT EXTRACTION  08-11-11   Bilateral   ESOPHAGEAL DILATION     KNEE SURGERY  08-11-11   rt. knee scope   PATELLAR TENDON REPAIR Left 02/22/2014   Procedure: PATELLA TENDON REPAIR;  Surgeon: Gearlean Alf, MD;  Location: [facility];  Service: Orthopedics;  Laterality: Left;   PATELLAR TENDON REPAIR Left 06/25/2014   Procedure: LEFT PATELLA TENDON REPAIR;  Surgeon: Gaynelle Arabian, MD;  Location: [facility];  Service: Orthopedics;  Laterality: Left;   TOTAL KNEE ARTHROPLASTY  08/16/2011   Procedure:  TOTAL KNEE ARTHROPLASTY;  Surgeon: Gearlean Alf, MD;  Location: [facility];  Service: Orthopedics;  Laterality: Right;   TOTAL KNEE ARTHROPLASTY Left 01/27/2014   Procedure: LEFT TOTAL KNEE ARTHROPLASTY;  Surgeon: Gearlean Alf, MD;  Location: [facility];  Service: Orthopedics;  Laterality: Left;   TUBAL LIGATION      Allergies  Allergies  Allergen Reactions   Alendronate Sodium     REACTION: JAW PAIN   Alendronate Sodium Other (See Comments)    REACTION: JAW PAIN   Celecoxib Other (See Comments)    REACTION: GI UPSET REACTION: GI UPSET   Omeprazole Nausea Only   Protonix [Pantoprazole Sodium] Nausea And Vomiting   Rabeprazole Nausea And Vomiting   Latex Itching and Rash     EKGs/Labs/Other Studies Reviewed:   The following studies were reviewed today:  Echo 01/27/2020: IMPRESSIONS     1. Left ventricular ejection fraction, by estimation, is 60 to 65%. The  left ventricle has normal function. The left ventricle has no regional  wall motion abnormalities. There is mild concentric left ventricular  hypertrophy. Left ventricular diastolic  parameters are consistent with Grade II diastolic dysfunction  (pseudonormalization).   2. Right ventricular systolic function is normal. The right ventricular  size is normal.   3. The mitral valve is grossly normal. Trivial mitral valve  regurgitation.   4. The aortic valve is tricuspid. There is mild calcification of the  aortic valve. There is mild thickening of the aortic valve. Aortic valve  regurgitation is trivial. No aortic stenosis is present.   Comparison(s): A prior study was performed on 02/23/2018. Similar study-  new, trivial aortic regurgitation.   EKG:  EKG is not ordered today.    Recent Labs: 03/19/2020: ALT 18; TSH 2.17 06/06/2020: B Natriuretic Peptide 353.5; Hemoglobin 12.7; Platelets 215 08/06/2020: BUN 16; Creatinine, Ser 0.95; Potassium 4.4; Sodium 141  Recent Lipid Panel    Component Value Date/Time   CHOL 163  03/19/2020 1223   TRIG 123.0 03/19/2020 1223   HDL 65.20 03/19/2020 1223   CHOLHDL 3 03/19/2020 1223   VLDL 24.6 03/19/2020 1223   LDLCALC 73 03/19/2020 1223     Home Medications   Current Meds  Medication Sig   acetaminophen (TYLENOL) 325 MG tablet Take 2 tablets (650 mg total) by mouth every 6 (six) hours as needed for mild pain (or Fever >/= 101).   atorvastatin (LIPITOR) 10 MG tablet TAKE 1 TABLET BY MOUTH DAILY   calcium carbonate (TUMS - DOSED IN MG ELEMENTAL CALCIUM) 500 MG chewable tablet Chew 1 tablet (200 mg of elemental calcium total) by mouth 4 (four) times daily as needed for indigestion or heartburn.   CALCIUM-MAGNESIUM-ZINC PO Take 1 capsule by mouth daily.   clopidogrel (PLAVIX) 75 MG tablet TAKE 1 TABLET DAILY   diclofenac sodium (VOLTAREN) 1 % GEL Apply 1 application topically 2 (two) times daily.   esomeprazole (NEXIUM) 40  MG capsule TAKE 1 CAPSULE TWICE A DAY BEFORE MEALS   furosemide (LASIX) 20 MG tablet TAKE 1 TABLET DAILY   gabapentin (NEURONTIN) 100 MG capsule Take 2 capsules (200 mg total) by mouth 3 (three) times daily.   HYDROcodone-acetaminophen (NORCO) 10-325 MG tablet Take 1 tablet by mouth every 6 (six) hours as needed.    metoprolol succinate (TOPROL-XL) 25 MG 24 hr tablet TAKE ONE-HALF (1/2) TABLET AT BEDTIME   Multiple Vitamins-Minerals (PRESERVISION AREDS PO) Take 1 capsule by mouth 2 (two) times daily.   ondansetron (ZOFRAN) 4 MG tablet Take 1 tablet (4 mg total) by mouth every 6 (six) hours as needed for nausea or vomiting.   Polyethyl Glycol-Propyl Glycol (SYSTANE OP) Apply 1-2 drops to eye 3 (three) times daily as needed (dry eyes.).    sertraline (ZOLOFT) 100 MG tablet TAKE 2 TABLETS DAILY     Review of Systems      All other systems reviewed and are otherwise negative except as noted above.  Physical Exam    VS:  BP (!) 102/50 (BP Location: Left Arm)    Pulse 71    Ht 5\' 5"  (1.651 m)    Wt 198 lb 12.8 oz (90.2 kg)    SpO2 96%    BMI 33.08  kg/m  , BMI Body mass index is 33.08 kg/m.  Wt Readings from Last 3 Encounters:  02/16/21 198 lb 12.8 oz (90.2 kg)  06/11/20 195 lb (88.5 kg)  06/06/20 180 lb (81.6 kg)     GEN: Well nourished, well developed, in no acute distress. HEENT: normal. Neck: Supple, no JVD, carotid bruits, or masses. Cardiac: RRR, no murmurs, rubs, or gallops. No clubbing, cyanosis, edema.  Radials/PT 2+ and equal bilaterally.  Respiratory:  Respirations regular and unlabored, clear to auscultation bilaterally. GI: Soft, nontender, nondistended. MS: No deformity or atrophy. Skin: Warm and dry, no rash. Neuro:  Strength and sensation are intact. Psych: Normal affect.  Assessment & Plan    Chronic diastolic heart failure -Appears euvolemic on exam -Continue Lasix 20 mg daily -We will check a CMP and BNP today  Hyperlipidemia -Last lipid panel showed total cholesterol 163, HDL 65, LDL 73, triglycerides 123 -We have ordered a lipid panel, CMP, CBC, and BNP today  Palpitations -these have resolved -Continue low-dose metoprolol succinate  Chronic SOB -Worse over the last couple of months -We will check an echocardiogram today to look at her heart function and valvular function     Disposition: Follow up 3 months with Sherren Mocha, MD or APP.  Signed, Elgie Collard, PA-C 02/16/2021, 5:18 PM [facility]

## 2021-02-16 NOTE — Patient Instructions (Signed)
Medication Instructions:   Your physician recommends that you continue on your current medications as directed. Please refer to the Current Medication list given to you today.   *If you need a refill on your cardiac medications before your next appointment, please call your pharmacy*   Lab Work:  TODAY!!!!!! CBC/CMET/LIPID/PRO BNP  If you have labs (blood work) drawn today and your tests are completely normal, you will receive your results only by: [place] (if you have MyChart) OR A paper copy in the mail If you have any lab test that is abnormal or we need to change your treatment, we will call you to review the results.   Testing/Procedures:  Your physician has requested that you have an echocardiogram. Echocardiography is a painless test that uses sound waves to create images of your heart. It provides your doctor with information about the size and shape of your heart and how well your hearts chambers and valves are working. This procedure takes approximately one hour. There are no restrictions for this procedure.    Follow-Up: At [facility], you and your health needs are our priority.  As part of our continuing mission to provide you with exceptional heart care, we have created designated Provider Care Teams.  These Care Teams include your primary Cardiologist (physician) and Advanced Practice Providers (APPs -  Physician Assistants and Nurse Practitioners) who all work together to provide you with the care you need, when you need it.  We recommend signing up for the patient portal called "MyChart".  Sign up information is provided on this After Visit Summary.  MyChart is used to connect with patients for Virtual Visits (Telemedicine).  Patients are able to view lab/test results, encounter notes, upcoming appointments, etc.  Non-urgent messages can be sent to your provider as well.   To learn more about what you can do with MyChart, go to [email or web address].     Your next appointment:   3 month(s)  The format for your next appointment:   In Person  Provider:   [name] Dopp, PA-C         Other Instructions  Please send in blood pressure readings X 2 weeks either call @ [phone] or mychart.    Blood Pressure Record Sheet To take your blood pressure, you will need a blood pressure machine. You can buy a blood pressure machine (blood pressure monitor) at your clinic, drug store, or online. When choosing one, consider: An automatic monitor that has an arm cuff. A cuff that wraps snugly around your upper arm. You should be able to fit only one finger between your arm and the cuff. A device that stores blood pressure reading results. Do not choose a monitor that measures your blood pressure from your wrist or finger. Follow your health care provider's instructions for how to take your blood pressure. To use this form: Get one reading in the morning (a.m.) before you take any medicines. Get one reading in the evening (p.m.) before supper. Take at least 2 readings with each blood pressure check. This makes sure the results are correct. Wait 1-2 minutes between measurements. Write down the results in the spaces on this form. Repeat this once a week, or as told by your health care provider. Make a follow-up appointment with your health care provider to discuss the results. Blood pressure log Date: _______________________ a.m. _____________________(1st reading) _____________________(2nd reading) p.m. _____________________(1st reading) _____________________(2nd reading) Date: _______________________ a.m. _____________________(1st reading) _____________________(2nd reading) p.m. _____________________(1st reading) _____________________(2nd reading) Date: _______________________ a.m.  _____________________(1st reading) _____________________(2nd reading) p.m. _____________________(1st reading) _____________________(2nd reading) Date:  _______________________ a.m. _____________________(1st reading) _____________________(2nd reading) p.m. _____________________(1st reading) _____________________(2nd reading) Date: _______________________ a.m. _____________________(1st reading) _____________________(2nd reading) p.m. _____________________(1st reading) _____________________(2nd reading) This information is not intended to replace advice given to you by your health care provider. Make sure you discuss any questions you have with your health care provider. Document Revised: 04/23/2019 Document Reviewed: 04/24/2019 Elsevier Patient Education  [place].

## 2021-02-17 LAB — COMPREHENSIVE METABOLIC PANEL
ALT: 16 IU/L (ref 0–32)
AST: 24 IU/L (ref 0–40)
Albumin/Globulin Ratio: 1.6 (ref 1.2–2.2)
Albumin: 4.5 g/dL (ref 3.6–4.6)
Alkaline Phosphatase: 107 IU/L (ref 44–121)
BUN/Creatinine Ratio: 21 (ref 12–28)
BUN: 20 mg/dL (ref 8–27)
Bilirubin Total: 0.2 mg/dL (ref 0.0–1.2)
CO2: 25 mmol/L (ref 20–29)
Calcium: 9.5 mg/dL (ref 8.7–10.3)
Chloride: 101 mmol/L (ref 96–106)
Creatinine, Ser: 0.94 mg/dL (ref 0.57–1.00)
Globulin, Total: 2.8 g/dL (ref 1.5–4.5)
Glucose: 102 mg/dL — ABNORMAL HIGH (ref 70–99)
Potassium: 4.5 mmol/L (ref 3.5–5.2)
Sodium: 141 mmol/L (ref 134–144)
Total Protein: 7.3 g/dL (ref 6.0–8.5)
eGFR: 60 mL/min/{1.73_m2} (ref >59)

## 2021-02-17 LAB — CBC
Hematocrit: 40.8 % (ref 34.0–46.6)
Hemoglobin: 13.2 g/dL (ref 11.1–15.9)
MCH: 28.9 pg (ref 26.6–33.0)
MCHC: 32.4 g/dL (ref 31.5–35.7)
MCV: 90 fL (ref 79–97)
Platelets: 275 10*3/uL (ref 150–450)
RBC: 4.56 x10E6/uL (ref 3.77–5.28)
RDW: 12.7 % (ref 11.7–15.4)
WBC: 6.2 10*3/uL (ref 3.4–10.8)

## 2021-02-17 LAB — LIPID PANEL
Chol/HDL Ratio: 2.6 ratio (ref 0.0–4.4)
Cholesterol, Total: 186 mg/dL (ref 100–199)
HDL: 72 mg/dL (ref >39)
LDL Chol Calc (NIH): 96 mg/dL (ref 0–99)
Triglycerides: 103 mg/dL (ref 0–149)
VLDL Cholesterol Cal: 18 mg/dL (ref 5–40)

## 2021-02-17 LAB — PRO B NATRIURETIC PEPTIDE: NT-Pro BNP: 164 pg/mL (ref 0–738)

## 2021-03-02 ENCOUNTER — Encounter: Payer: Self-pay | Admitting: Family

## 2021-03-02 ENCOUNTER — Ambulatory Visit (INDEPENDENT_AMBULATORY_CARE_PROVIDER_SITE_OTHER): Payer: Medicare Other | Admitting: Family

## 2021-03-02 ENCOUNTER — Other Ambulatory Visit: Payer: Self-pay

## 2021-03-02 VITALS — BP 116/64 | HR 70 | Ht 65.0 in | Wt 199.0 lb

## 2021-03-02 DIAGNOSIS — R300 Dysuria: Secondary | ICD-10-CM | POA: Diagnosis not present

## 2021-03-02 DIAGNOSIS — N3001 Acute cystitis with hematuria: Secondary | ICD-10-CM | POA: Diagnosis not present

## 2021-03-02 LAB — POCT URINALYSIS DIPSTICK
Bilirubin, UA: NEGATIVE
Glucose, UA: NEGATIVE
Ketones, UA: NEGATIVE
Nitrite, UA: NEGATIVE
Protein, UA: NEGATIVE
Spec Grav, UA: 1.01
Urobilinogen, UA: 0.2 U/dL
pH, UA: 6

## 2021-03-02 MED ORDER — AMOXICILLIN-POT CLAVULANATE 875-125 MG PO TABS: 1.0000 | ORAL_TABLET | Freq: Two times a day (BID) | ORAL | 0 refills | Status: AC

## 2021-03-02 NOTE — Patient Instructions (Signed)
It was a pleasure seeing you today.   You were found to have a urinary tract infection, you have been prescribed an antibiotic to your preferred pharmacy. Please start antibiotic today as directed.   We are sending your urine for a culture to make sure you do not have a resistant bacteria. We will call you if we need to change your medications.   Please make sure you are drinking plenty of fluids over the next few days.  If your symptoms do not improve over the next 5-7 days, or if they worsen, please let us know. Please also let us know if you have worsening back pain, fevers, chills, or body aches.   Regards,   Huzaifa Viney  

## 2021-03-02 NOTE — Assessment & Plan Note (Signed)
antbx sent to pharmacy, pt to take as directed. Encouraged increased water intake throughout the day. Urine culture/reflex pending results. Choosing to treat due to being symptomatic. If no improvement in the next 2 days pt advised to let me know.  

## 2021-03-02 NOTE — Assessment & Plan Note (Signed)
poct urine and urine culture ordered, pending culture.

## 2021-03-02 NOTE — Progress Notes (Signed)
Established Patient Office Visit  Subjective:  Patient ID: [name] [name], female    DOB: 1934-10-06  Age: 87 y.o. MRN: [id number]  CC:  Chief Complaint  Patient presents with   Urinary Tract Infection    Pt stated--when urinating have burning, pain--over 1 week. Tried Azo did not help.    HPI [name] is here today with concerns.   One week h/o dysuria, urinary frequency and urinary urgency. Lower abdominal pelvic discomfort. Tried azo with no relief. No flank pain. No fever no chills. Doesn't see blood in urine.   Past Medical History:  Diagnosis Date   Alternating constipation and diarrhea    Anxiety state, unspecified    Complication of anesthesia    slow to wake up with surgery several years ago    COVID-19 virus infection 12/2018   Depressive disorder, not elsewhere classified    Diaphragmatic hernia without mention of obstruction or gangrene    Difficulty sleeping    Diverticulosis of colon (without mention of hemorrhage)    Esophageal reflux    External hemorrhoids without mention of complication    Fatigue    Frequency of urination    Helicobacter pylori (H. pylori)    Hiatal hernia    History of transfusion    Lumbar spondylosis    Macular degeneration (senile) of retina, unspecified    Osteoarthrosis, unspecified whether generalized or localized, unspecified site 08-11-11   hx. "rhematoid arthritis", osteoarthritis, DDD, bursitis(hip)   Osteoporosis    Palpitations    PONV (postoperative nausea and vomiting)    Schatzki's ring    Shortness of breath 08-11-11   with exertion only at present   Unspecified cerebral artery occlusion with cerebral infarction 08-11-11   '97/ '06( TIA)-affected lt. side, slight weakness L side   Unspecified vitamin D deficiency     Past Surgical History:  Procedure Laterality Date   ABDOMINAL HYSTERECTOMY  08-11-11   APPENDECTOMY     BACK SURGERY  08-11-11   '11-hx. lumbar fusion with retained hardware   CATARACT  EXTRACTION  08-11-11   Bilateral   ESOPHAGEAL DILATION     KNEE SURGERY  08-11-11   rt. knee scope   PATELLAR TENDON REPAIR Left 02/22/2014   Procedure: PATELLA TENDON REPAIR;  Surgeon: Gearlean Alf, MD;  Location: [facility];  Service: Orthopedics;  Laterality: Left;   PATELLAR TENDON REPAIR Left 06/25/2014   Procedure: LEFT PATELLA TENDON REPAIR;  Surgeon: Gaynelle Arabian, MD;  Location: [facility];  Service: Orthopedics;  Laterality: Left;   TOTAL KNEE ARTHROPLASTY  08/16/2011   Procedure: TOTAL KNEE ARTHROPLASTY;  Surgeon: Gearlean Alf, MD;  Location: [facility];  Service: Orthopedics;  Laterality: Right;   TOTAL KNEE ARTHROPLASTY Left 01/27/2014   Procedure: LEFT TOTAL KNEE ARTHROPLASTY;  Surgeon: Gearlean Alf, MD;  Location: [facility];  Service: Orthopedics;  Laterality: Left;   TUBAL LIGATION      Family History  Problem Relation Age of Onset   Heart disease Father    Coronary artery disease Brother    Diabetes Brother    Heart disease Brother    Pancreatic cancer Daughter    Diabetes Sister    Colon cancer Neg Hx    Colon polyps Neg Hx    Esophageal cancer Neg Hx    Gallbladder disease Neg Hx     Social History   Socioeconomic History   Marital status: Widowed    Spouse name: Not on file   Number of  children: 3   Years of education: Not on file   Highest education level: Not on file  Occupational History   Occupation: disabled    Employer: RETIRED  Tobacco Use   Smoking status: Never   Smokeless tobacco: Never  Vaping Use   Vaping Use: Never used  Substance and Sexual Activity   Alcohol use: No    Alcohol/week: 0.0 standard drinks   Drug use: No   Sexual activity: Never  Other Topics Concern   Not on file  Social History Narrative   Not on file   Social Determinants of Health   Financial Resource Strain: Not on file  Food Insecurity: Not on file  Transportation Needs: Not on file  Physical Activity: Not on file  Stress: Not on file  Social Connections: Not on  file  Intimate Partner Violence: Not on file    Outpatient Medications Prior to Visit  Medication Sig Dispense Refill   acetaminophen (TYLENOL) 325 MG tablet Take 2 tablets (650 mg total) by mouth every 6 (six) hours as needed for mild pain (or Fever >/= 101). 40 tablet 0   atorvastatin (LIPITOR) 10 MG tablet TAKE 1 TABLET BY MOUTH DAILY 90 tablet 2   calcium carbonate (TUMS - DOSED IN MG ELEMENTAL CALCIUM) 500 MG chewable tablet Chew 1 tablet (200 mg of elemental calcium total) by mouth 4 (four) times daily as needed for indigestion or heartburn. 10 tablet 0   CALCIUM-MAGNESIUM-ZINC PO Take 1 capsule by mouth daily.     clopidogrel (PLAVIX) 75 MG tablet TAKE 1 TABLET DAILY 90 tablet 1   diclofenac sodium (VOLTAREN) 1 % GEL Apply 1 application topically 2 (two) times daily.     esomeprazole (NEXIUM) 40 MG capsule TAKE 1 CAPSULE TWICE A DAY BEFORE MEALS 180 capsule 0   furosemide (LASIX) 20 MG tablet TAKE 1 TABLET DAILY 90 tablet 2   gabapentin (NEURONTIN) 100 MG capsule Take 2 capsules (200 mg total) by mouth 3 (three) times daily. 540 capsule 1   HYDROcodone-acetaminophen (NORCO) 10-325 MG tablet Take 1 tablet by mouth every 6 (six) hours as needed.   0   metoprolol succinate (TOPROL-XL) 25 MG 24 hr tablet TAKE ONE-HALF (1/2) TABLET AT BEDTIME 45 tablet 2   Multiple Vitamins-Minerals (PRESERVISION AREDS PO) Take 1 capsule by mouth 2 (two) times daily.     ondansetron (ZOFRAN) 4 MG tablet Take 1 tablet (4 mg total) by mouth every 6 (six) hours as needed for nausea or vomiting. 30 tablet 1   Polyethyl Glycol-Propyl Glycol (SYSTANE OP) Apply 1-2 drops to eye 3 (three) times daily as needed (dry eyes.).      sertraline (ZOLOFT) 100 MG tablet TAKE 2 TABLETS DAILY 180 tablet 0   No facility-administered medications prior to visit.    Allergies  Allergen Reactions   Alendronate Sodium     REACTION: JAW PAIN   Alendronate Sodium Other (See Comments)    REACTION: JAW PAIN   Celecoxib Other  (See Comments)    REACTION: GI UPSET REACTION: GI UPSET   Omeprazole Nausea Only   Protonix [Pantoprazole Sodium] Nausea And Vomiting   Rabeprazole Nausea And Vomiting   Latex Itching and Rash    ROS Review of Systems  Constitutional:  Negative for chills and fever.  Gastrointestinal:  Negative for abdominal pain.  Genitourinary:  Positive for dysuria, frequency and urgency. Negative for decreased urine volume, difficulty urinating, flank pain, hematuria, pelvic pain and vaginal discharge.  Musculoskeletal:  Negative for arthralgias.  Objective:    Physical Exam Constitutional:      General: She is not in acute distress.    Appearance: Normal appearance. She is well-developed and well-groomed. She is not ill-appearing.  HENT:     Mouth/Throat:     Pharynx: No pharyngeal swelling.     Tonsils: No tonsillar exudate.  Neck:     Thyroid: No thyroid mass.  Cardiovascular:     Rate and Rhythm: Normal rate and regular rhythm.  Pulmonary:     Effort: Pulmonary effort is normal.  Abdominal:     General: Abdomen is flat.     Tenderness: There is no abdominal tenderness.  Musculoskeletal:     Lumbar back: Normal. No tenderness.     Comments: No CV tenderness  Lymphadenopathy:     Cervical:     Right cervical: No superficial cervical adenopathy.    Left cervical: No superficial cervical adenopathy.  Neurological:     General: No focal deficit present.     Mental Status: She is alert.    BP 116/64    Pulse 70    Ht 5' 5"  (1.651 m)    Wt 199 lb (90.3 kg)    SpO2 96%    BMI 33.12 kg/m  Wt Readings from Last 3 Encounters:  03/02/21 199 lb (90.3 kg)  02/16/21 198 lb 12.8 oz (90.2 kg)  06/11/20 195 lb (88.5 kg)     Health Maintenance Due  Topic Date Due   TETANUS/TDAP  06/12/2017   COVID-19 Vaccine (3 - Pfizer risk series) 02/04/2020   INFLUENZA VACCINE  08/17/2020   MAMMOGRAM  10/10/2020    There are no preventive care reminders to display for this patient.  Lab  Results  Component Value Date   TSH 2.17 03/19/2020   Lab Results  Component Value Date   WBC 6.2 02/16/2021   HGB 13.2 02/16/2021   HCT 40.8 02/16/2021   MCV 90 02/16/2021   PLT 275 02/16/2021   Lab Results  Component Value Date   NA 141 02/16/2021   K 4.5 02/16/2021   CO2 25 02/16/2021   GLUCOSE 102 (H) 02/16/2021   BUN 20 02/16/2021   CREATININE 0.94 02/16/2021   BILITOT 0.2 02/16/2021   ALKPHOS 107 02/16/2021   AST 24 02/16/2021   ALT 16 02/16/2021   PROT 7.3 02/16/2021   ALBUMIN 4.5 02/16/2021   CALCIUM 9.5 02/16/2021   ANIONGAP 9 06/06/2020   EGFR 60 02/16/2021   GFR 72.59 03/19/2020   Lab Results  Component Value Date   HGBA1C 6.1 03/19/2020      Assessment & Plan:   Problem List Items Addressed This Visit       Genitourinary   Acute cystitis with hematuria    antbx sent to pharmacy, pt to take as directed. Encouraged increased water intake throughout the day. Urine culture/reflex pending results. Choosing to treat due to being symptomatic. If no improvement in the next 2 days pt advised to let me know.       Relevant Medications   amoxicillin-clavulanate (AUGMENTIN) 875-125 MG tablet   Other Relevant Orders   POCT Urinalysis Dipstick   Urine Culture     Other   Dysuria - Primary    poct urine and urine culture ordered, pending culture.       Relevant Orders   POCT Urinalysis Dipstick   Urine Culture    Meds ordered this encounter  Medications   amoxicillin-clavulanate (AUGMENTIN) 875-125 MG tablet    Sig:  Take 1 tablet by mouth 2 (two) times daily for 10 days.    Dispense:  20 tablet    Refill:  0    Order Specific Question:   Supervising Provider    Answer:   BEDSOLE, AMY E [[id number]]    Follow-up: Return if symptoms worsen or fail to improve.    Eugenia Pancoast, FNP

## 2021-03-04 DIAGNOSIS — H524 Presbyopia: Secondary | ICD-10-CM | POA: Diagnosis not present

## 2021-03-04 DIAGNOSIS — D3132 Benign neoplasm of left choroid: Secondary | ICD-10-CM | POA: Diagnosis not present

## 2021-03-04 DIAGNOSIS — H353132 Nonexudative age-related macular degeneration, bilateral, intermediate dry stage: Secondary | ICD-10-CM | POA: Diagnosis not present

## 2021-03-04 DIAGNOSIS — H04123 Dry eye syndrome of bilateral lacrimal glands: Secondary | ICD-10-CM | POA: Diagnosis not present

## 2021-03-04 DIAGNOSIS — H43813 Vitreous degeneration, bilateral: Secondary | ICD-10-CM | POA: Diagnosis not present

## 2021-03-05 ENCOUNTER — Other Ambulatory Visit: Payer: Self-pay

## 2021-03-05 ENCOUNTER — Ambulatory Visit (HOSPITAL_COMMUNITY): Payer: Medicare Other | Attending: Internal Medicine

## 2021-03-05 ENCOUNTER — Other Ambulatory Visit: Payer: Self-pay | Admitting: Family

## 2021-03-05 DIAGNOSIS — I5032 Chronic diastolic (congestive) heart failure: Secondary | ICD-10-CM

## 2021-03-05 DIAGNOSIS — E782 Mixed hyperlipidemia: Secondary | ICD-10-CM | POA: Diagnosis not present

## 2021-03-05 DIAGNOSIS — R002 Palpitations: Secondary | ICD-10-CM | POA: Insufficient documentation

## 2021-03-05 DIAGNOSIS — N3001 Acute cystitis with hematuria: Secondary | ICD-10-CM

## 2021-03-05 DIAGNOSIS — R0602 Shortness of breath: Secondary | ICD-10-CM | POA: Diagnosis not present

## 2021-03-05 LAB — URINE CULTURE
MICRO NUMBER:: 13006839
SPECIMEN QUALITY:: ADEQUATE

## 2021-03-05 LAB — ECHOCARDIOGRAM COMPLETE
Area-P 1/2: 3.27 cm2
P 1/2 time: 503 msec
S' Lateral: 2.9 cm

## 2021-03-05 MED ORDER — CIPROFLOXACIN HCL 500 MG PO TABS: 500.0000 mg | ORAL_TABLET | Freq: Two times a day (BID) | ORAL | 0 refills | Status: AC

## 2021-03-05 NOTE — Progress Notes (Signed)
Please let patient know that her susceptibility report from a urinary tract infection did come back with the E. coli as the main bacteria.  The medication that she is currently taking Augmentin and is resistant to this bacteria.  I have sent in prescription for ciprofloxacin I want patient to stop Augmentin and start Cipro when she picks it up from the pharmacy.

## 2021-03-09 ENCOUNTER — Encounter: Payer: Self-pay | Admitting: Family Medicine

## 2021-03-10 NOTE — Telephone Encounter (Signed)
Spoke to pt and advised her of Dr. Marliss Coots comments. Pt will keep drinking water. Pt said she does feel better today. She is unable to say when she can have someone drop off a urine sample but once she gets a ride she will stop by the office to leave urine sample. But since she is feeling better today she will just keep drinking water and update if no improvement

## 2021-03-12 ENCOUNTER — Other Ambulatory Visit: Payer: Self-pay

## 2021-03-12 ENCOUNTER — Other Ambulatory Visit: Payer: Medicare Other

## 2021-03-12 DIAGNOSIS — R300 Dysuria: Secondary | ICD-10-CM | POA: Diagnosis not present

## 2021-03-12 DIAGNOSIS — H353132 Nonexudative age-related macular degeneration, bilateral, intermediate dry stage: Secondary | ICD-10-CM | POA: Diagnosis not present

## 2021-03-13 LAB — URINE CULTURE
MICRO NUMBER:: 13054623
Result:: NO GROWTH
SPECIMEN QUALITY:: ADEQUATE

## 2021-03-15 ENCOUNTER — Other Ambulatory Visit: Payer: Self-pay | Admitting: Family Medicine

## 2021-03-15 NOTE — Telephone Encounter (Signed)
Please schedule PE May or later I refilled for 3 months

## 2021-03-15 NOTE — Telephone Encounter (Signed)
Last filled on 09/15/20 #90 tabs with 1 refills, last OV was an acute with another provider on 03/02/21, last CPE was on 05/11/20

## 2021-03-29 DIAGNOSIS — Z20822 Contact with and (suspected) exposure to covid-19: Secondary | ICD-10-CM | POA: Diagnosis not present

## 2021-04-02 ENCOUNTER — Other Ambulatory Visit: Payer: Self-pay | Admitting: Family Medicine

## 2021-04-06 ENCOUNTER — Ambulatory Visit: Payer: Medicare Other | Admitting: Physician Assistant

## 2021-04-06 ENCOUNTER — Other Ambulatory Visit: Payer: Self-pay | Admitting: Family Medicine

## 2021-04-11 DIAGNOSIS — H353132 Nonexudative age-related macular degeneration, bilateral, intermediate dry stage: Secondary | ICD-10-CM | POA: Diagnosis not present

## 2021-04-15 NOTE — Telephone Encounter (Signed)
Appt scheduled for 05/12/21 at 11:30 am ?

## 2021-05-03 DIAGNOSIS — Z20822 Contact with and (suspected) exposure to covid-19: Secondary | ICD-10-CM | POA: Diagnosis not present

## 2021-05-06 NOTE — Progress Notes (Signed)
?Cardiology Office Note:   ? ?Date:  05/07/2021  ? ?[name] [name], DOB [birth year], MRN [id number] ? ?PCP:  Abner Greenspan, MD  ?[facility] Providers ?Cardiologist:  Sherren Mocha, MD    ?Referring MD: Abner Greenspan, MD  ? ?Chief Complaint:  f/u for CHF and Chest Pain ?  ? ?Patient Profile: ?Palpitations  ?Hx of TIA ?Chronic shortness of breath  ?Myoview 02/2018: low risk  ?Echocardiogram 02/2018: EF 60-65, Gr 2 DD ?Echocardiogram 02/2021: EF 60-65, Gr 2 DD, mild to mod TR ?Heart failure with preserved ejection fraction  ?Coronary Ca2+ on Chest CT ?Aortic atherosclerosis  ?Hyperlipidemia  ?Hx of CVA ?MRI 11/21: remote R MCA area infarct ?DJD  ?Anxiety  ?GERD  ?Macular degeneration  ?Osteoporosis  ?Hx of COVID-19 in 12/2018 ?Long COVID ?  ?Prior CV studies: ?Echocardiogram 03/05/2021 ?EF 60-65, no RWMA, mild LVH, Gr 2 DD, normal RVSF, mild LAE, mild to mod TR, mild AI, AV sclerosis w/o AS, cannot exclude PFO ? ?High Resolution Chest CT 03/13/2018 ?IMPRESSION: ?1. No definitive evidence of interstitial lung disease. Air trapping ?is indicative of small airways disease. ?2. Cholelithiasis. ?3. Moderate hiatal hernia. ?4. Aortic atherosclerosis (ICD10-170.0). Coronary artery ?calcification. ?  ?Myoview 02/23/2018  ?EF 69, normal perfusion, low risk  ?  ?Event monitor 05/2015 ?Sinus rhythm with PAC's and PVC's, periods of ventricular bigeminy (PVC burden approximately 1%) ?No tachyarrhythmia or pathologic pauses ? ? ? ?History of Present Illness:   ?[name] [name] is a 87 y.o. female with the above problem list.  She was last seen by Nicholes Rough, PA-C in Jan 2023.  She returns for f/u.  She is here with her daughter.  She continues to have issues with shortness of breath.  She has been evaluated for shortness of breath since 2019.  CT was neg for ILD (Interstitial Lung Disease).  Myoview in 2020 was low risk.  Echocardiogram after her last visit with Tessa demonstrated normal EF and mod diastolic dysfunction.   She feels her shortness of breath is worse now.  She also has chest pain.  She notes it with palpation and with activity.  She has symptoms with cleaning, etc at her home.       ?   ?Past Medical History:  ?Diagnosis Date  ? Alternating constipation and diarrhea   ? Anxiety state, unspecified   ? Complication of anesthesia   ? slow to wake up with surgery several years ago   ? COVID-19 virus infection 12/2018  ? Depressive disorder, not elsewhere classified   ? Diaphragmatic hernia without mention of obstruction or gangrene   ? Difficulty sleeping   ? Diverticulosis of colon (without mention of hemorrhage)   ? Esophageal reflux   ? External hemorrhoids without mention of complication   ? Fatigue   ? Frequency of urination   ? Helicobacter pylori (H. pylori)   ? Hiatal hernia   ? History of transfusion   ? Lumbar spondylosis   ? Macular degeneration (senile) of retina, unspecified   ? Osteoarthrosis, unspecified whether generalized or localized, unspecified site 08-11-11  ? hx. "rhematoid arthritis", osteoarthritis, DDD, bursitis(hip)  ? Osteoporosis   ? Palpitations   ? PONV (postoperative nausea and vomiting)   ? Schatzki's ring   ? Shortness of breath 08-11-11  ? with exertion only at present  ? Unspecified cerebral artery occlusion with cerebral infarction 08-11-11  ? '97/ '06( TIA)-affected lt. side, slight weakness L side  ? Unspecified vitamin D deficiency   ? ?  Current Medications: ?Current Meds  ?Medication Sig  ? acetaminophen (TYLENOL) 325 MG tablet Take 2 tablets (650 mg total) by mouth every 6 (six) hours as needed for mild pain (or Fever >/= 101).  ? atorvastatin (LIPITOR) 10 MG tablet TAKE 1 TABLET DAILY  ? calcium carbonate (TUMS - DOSED IN MG ELEMENTAL CALCIUM) 500 MG chewable tablet Chew 1 tablet (200 mg of elemental calcium total) by mouth 4 (four) times daily as needed for indigestion or heartburn.  ? CALCIUM-MAGNESIUM-ZINC PO Take 1 capsule by mouth daily.  ? clopidogrel (PLAVIX) 75 MG tablet TAKE 1  TABLET DAILY  ? diclofenac sodium (VOLTAREN) 1 % GEL Apply 1 application topically 2 (two) times daily.  ? esomeprazole (NEXIUM) 40 MG capsule TAKE 1 CAPSULE TWICE A DAY BEFORE MEALS  ? furosemide (LASIX) 20 MG tablet TAKE 1 TABLET DAILY  ? gabapentin (NEURONTIN) 100 MG capsule Take 2 capsules (200 mg total) by mouth 3 (three) times daily.  ? HYDROcodone-acetaminophen (NORCO) 10-325 MG tablet Take 1 tablet by mouth every 6 (six) hours as needed.   ? metoprolol succinate (TOPROL-XL) 25 MG 24 hr tablet TAKE ONE-HALF (1/2) TABLET AT BEDTIME  ? Multiple Vitamins-Minerals (PRESERVISION AREDS PO) Take 1 capsule by mouth 2 (two) times daily.  ? nitroGLYCERIN (NITROSTAT) 0.4 MG SL tablet Place 1 tablet (0.4 mg total) under the tongue every 5 (five) minutes as needed for chest pain.  ? Polyethyl Glycol-Propyl Glycol (SYSTANE OP) Apply 1-2 drops to eye 3 (three) times daily as needed (dry eyes.).   ? sertraline (ZOLOFT) 100 MG tablet TAKE 2 TABLETS DAILY  ?  ?Allergies:   Alendronate sodium, Alendronate sodium, Celecoxib, Omeprazole, Protonix [pantoprazole sodium], Rabeprazole, and Latex  ? ?Social History  ? ?Tobacco Use  ? Smoking status: Never  ? Smokeless tobacco: Never  ?Vaping Use  ? Vaping Use: Never used  ?Substance Use Topics  ? Alcohol use: No  ?  Alcohol/week: 0.0 standard drinks  ? Drug use: No  ?  ?Family Hx: ?The patient's family history includes Coronary artery disease in her brother; Diabetes in her brother and sister; Heart disease in her brother and father; Pancreatic cancer in her daughter. There is no history of Colon cancer, Colon polyps, Esophageal cancer, or Gallbladder disease. ? ?Review of Systems  ?Constitutional: Negative for fever.  ?Respiratory:  Negative for cough.   ?Gastrointestinal:  Positive for diarrhea (IBS). Negative for hematochezia.  ?Genitourinary:  Negative for hematuria.   ? ?EKGs/Labs/Other Test Reviewed:   ? ?EKG:  EKG is   ordered today.  The ekg ordered today demonstrates NSR, HR  66, normal axis, no ST-T wave changes, QTc 440 ? ?Recent Labs: ?06/06/2020: B Natriuretic Peptide 353.5 ?02/16/2021: ALT 16; BUN 20; Creatinine, Ser 0.94; Hemoglobin 13.2; NT-Pro BNP 164; Platelets 275; Potassium 4.5; Sodium 141  ? ?Recent Lipid Panel ?Recent Labs  ?  02/16/21 ?1550  ?CHOL 186  ?TRIG 103  ?HDL 72  ?[place] 96  ?  ? ?Risk Assessment/Calculations:   ?  ?    ?Physical Exam:   ? ?VS:  BP (!) 108/50   Pulse 78   Ht '5\' 4"'$  (1.626 m)   Wt 198 lb 3.2 oz (89.9 kg)   SpO2 93%   BMI 34.02 kg/m?    ? ?Wt Readings from Last 3 Encounters:  ?05/07/21 198 lb 3.2 oz (89.9 kg)  ?03/02/21 199 lb (90.3 kg)  ?02/16/21 198 lb 12.8 oz (90.2 kg)  ?  ?Constitutional:   ?  Appearance: Healthy appearance. Not in distress.  ?Neck:  ?   Vascular: No JVR.  ?Pulmonary:  ?   Effort: Pulmonary effort is normal.  ?   Breath sounds: No wheezing. No rales.  ?Cardiovascular:  ?   Normal rate. Regular rhythm. Normal S1. Normal S2.   ?   Murmurs: There is no murmur.  ?Edema: ?   Peripheral edema absent.  ?Abdominal:  ?   Palpations: Abdomen is soft.  ?Skin: ?   General: Skin is warm and dry.  ?Neurological:  ?   Mental Status: Alert and oriented to person, place and time.  ?   Cranial Nerves: Cranial nerves are intact.  ?  ?    ?ASSESSMENT & PLAN:   ?Precordial chest pain ?She notes symptoms of chest pain with activity and at rest. She also notes more shortness of breath.  She has had chronic shortness of breath for years.  I was able to reproduce symptoms with palpation today as well.  I suspect she is having MSK chest pain.  She has a lot of back and joint issues.  She arrived in a wheelchair today.  She usually uses a walker. It may be that her walking posture has led to her chest pain.  She is likely short of breath from deconditioning given her unremarkable workup in the past.  She has coronary Ca2+ on prior CT.  EKG today is without acute changes.  Arrange Lexiscan Myoview.  NTG prn Rx.  F/u 6 mos or sooner if Myoview abnormal  or symptoms worsen.   ? ?Aortic atherosclerosis ([place]) ?Continue Lipitor, Plavix. ? ?Chronic heart failure with preserved ejection fraction ([place]) ?Echo in January 2023 with normal EF, moderate diastolic dysfun

## 2021-05-07 ENCOUNTER — Encounter: Payer: Self-pay | Admitting: Physician Assistant

## 2021-05-07 ENCOUNTER — Ambulatory Visit (INDEPENDENT_AMBULATORY_CARE_PROVIDER_SITE_OTHER): Payer: Medicare Other | Admitting: Physician Assistant

## 2021-05-07 VITALS — BP 108/50 | HR 78 | Ht 64.0 in | Wt 198.2 lb

## 2021-05-07 DIAGNOSIS — I5032 Chronic diastolic (congestive) heart failure: Secondary | ICD-10-CM

## 2021-05-07 DIAGNOSIS — I700 Atherosclerosis of aorta: Secondary | ICD-10-CM

## 2021-05-07 DIAGNOSIS — I2510 Atherosclerotic heart disease of native coronary artery without angina pectoris: Secondary | ICD-10-CM | POA: Diagnosis not present

## 2021-05-07 DIAGNOSIS — E782 Mixed hyperlipidemia: Secondary | ICD-10-CM

## 2021-05-07 DIAGNOSIS — R072 Precordial pain: Secondary | ICD-10-CM

## 2021-05-07 MED ORDER — NITROGLYCERIN 0.4 MG SL SUBL: 0.4000 mg | SUBLINGUAL_TABLET | SUBLINGUAL | 3 refills | Status: AC | PRN

## 2021-05-07 NOTE — Assessment & Plan Note (Signed)
LDL in January 2023 was 96.  Continue Lipitor 10 mg daily. ?

## 2021-05-07 NOTE — Patient Instructions (Addendum)
Medication Instructions:  ?Your physician has recommended you make the following change in your medication:  ?We sent you a prescription in for Nitroglycerin.  The proper use and anticipated side effects of nitroglycerine has been carefully explained.  If a single episode of chest pain is not relieved by one tablet, the patient will try another within 5 minutes; and if this doesn't relieve the pain, the patient is instructed to call 911 for transportation to an emergency department. ? ?*If you need a refill on your cardiac medications before your next appointment, please call your pharmacy* ? ? ?Lab Work: ?None ordered ? ?If you have labs (blood work) drawn today and your tests are completely normal, you will receive your results only by: ?MyChart Message (if you have MyChart) OR ?A paper copy in the mail ?If you have any lab test that is abnormal or we need to change your treatment, we will call you to review the results. ? ? ?Testing/Procedures: ?Your physician has requested that you have a lexiscan myoview. For further information please visit [email or web address]. Please follow instruction sheet, BELOW: ? ? ? ?You are scheduled for a Myocardial Perfusion Imaging Study  ?Please arrive 15 minutes prior to your appointment time for registration and insurance purposes. ? ?The test will take approximately 3 to 4 hours to complete; you may bring reading material.  If someone comes with you to your appointment, they will need to remain in the main lobby due to limited space in the testing area. **If you are pregnant or breastfeeding, please notify the nuclear lab prior to your appointment** ? ?How to prepare for your Myocardial Perfusion Test: ?Do not eat or drink 3 hours prior to your test, except you may have water. ?Do not consume products containing caffeine (regular or decaffeinated) 12 hours prior to your test. (ex: coffee, chocolate, sodas, tea). ?Do bring a list of your current medications with you.  If not  listed below, you may take your medications as normal. ?Do wear comfortable clothes (no dresses or overalls) and walking shoes, tennis shoes preferred (No heels or open toe shoes are allowed). ?Do NOT wear cologne, perfume, aftershave, or lotions (deodorant is allowed). ?If these instructions are not followed, your test will have to be rescheduled. ? ? ? ? ? ?Follow-Up: ?At [facility], you and your health needs are our priority.  As part of our continuing mission to provide you with exceptional heart care, we have created designated Provider Care Teams.  These Care Teams include your primary Cardiologist (physician) and Advanced Practice Providers (APPs -  Physician Assistants and Nurse Practitioners) who all work together to provide you with the care you need, when you need it. ? ?We recommend signing up for the patient portal called "MyChart".  Sign up information is provided on this After Visit Summary.  MyChart is used to connect with patients for Virtual Visits (Telemedicine).  Patients are able to view lab/test results, encounter notes, upcoming appointments, etc.  Non-urgent messages can be sent to your provider as well.   ?To learn more about what you can do with MyChart, go to [email or web address].   ? ?Your next appointment:   ?6 month(s) ? ?The format for your next appointment:   ?In Person ? ?Provider:   ?Sherren Mocha, MD   ? ? ?Other Instructions ? ? ?Important Information About Sugar ? ? ? ? ?  ?

## 2021-05-07 NOTE — Assessment & Plan Note (Signed)
Echo in January 2023 with normal EF, moderate diastolic dysfunction.  Overall, volume status appears stable.  BNP in January was normal.  Continue furosemide 20 mg daily. ?

## 2021-05-07 NOTE — Assessment & Plan Note (Signed)
She notes symptoms of chest pain with activity and at rest. She also notes more shortness of breath.  She has had chronic shortness of breath for years.  I was able to reproduce symptoms with palpation today as well.  I suspect she is having MSK chest pain.  She has a lot of back and joint issues.  She arrived in a wheelchair today.  She usually uses a walker. It may be that her walking posture has led to her chest pain.  She is likely short of breath from deconditioning given her unremarkable workup in the past.  She has coronary Ca2+ on prior CT.  EKG today is without acute changes.  Arrange Lexiscan Myoview.  NTG prn Rx.  F/u 6 mos or sooner if Myoview abnormal or symptoms worsen.   ?

## 2021-05-07 NOTE — Assessment & Plan Note (Signed)
Continue Plavix 75 mg daily, Lipitor 10 mg daily.  Obtain Lexiscan Myoview as noted. ?

## 2021-05-07 NOTE — Assessment & Plan Note (Signed)
Continue Lipitor, Plavix. ?

## 2021-05-07 NOTE — Addendum Note (Signed)
Addended by: Gaetano Net on: 05/07/2021 11:56 AM ? ? Modules accepted: Orders ? ?

## 2021-05-11 DIAGNOSIS — H353132 Nonexudative age-related macular degeneration, bilateral, intermediate dry stage: Secondary | ICD-10-CM | POA: Diagnosis not present

## 2021-05-12 ENCOUNTER — Ambulatory Visit (INDEPENDENT_AMBULATORY_CARE_PROVIDER_SITE_OTHER): Payer: Medicare Other | Admitting: Family Medicine

## 2021-05-12 ENCOUNTER — Encounter: Payer: Self-pay | Admitting: Family Medicine

## 2021-05-12 VITALS — BP 118/68 | HR 64 | Temp 97.7°F | Ht 63.5 in | Wt 201.1 lb

## 2021-05-12 DIAGNOSIS — R413 Other amnesia: Secondary | ICD-10-CM | POA: Diagnosis not present

## 2021-05-12 DIAGNOSIS — Z1211 Encounter for screening for malignant neoplasm of colon: Secondary | ICD-10-CM | POA: Diagnosis not present

## 2021-05-12 DIAGNOSIS — Z8673 Personal history of transient ischemic attack (TIA), and cerebral infarction without residual deficits: Secondary | ICD-10-CM

## 2021-05-12 DIAGNOSIS — R7303 Prediabetes: Secondary | ICD-10-CM | POA: Diagnosis not present

## 2021-05-12 DIAGNOSIS — Z0000 Encounter for general adult medical examination without abnormal findings: Secondary | ICD-10-CM

## 2021-05-12 DIAGNOSIS — M810 Age-related osteoporosis without current pathological fracture: Secondary | ICD-10-CM

## 2021-05-12 DIAGNOSIS — R002 Palpitations: Secondary | ICD-10-CM | POA: Diagnosis not present

## 2021-05-12 DIAGNOSIS — Z6835 Body mass index (BMI) 35.0-35.9, adult: Secondary | ICD-10-CM

## 2021-05-12 DIAGNOSIS — K219 Gastro-esophageal reflux disease without esophagitis: Secondary | ICD-10-CM

## 2021-05-12 DIAGNOSIS — H9193 Unspecified hearing loss, bilateral: Secondary | ICD-10-CM

## 2021-05-12 DIAGNOSIS — I2510 Atherosclerotic heart disease of native coronary artery without angina pectoris: Secondary | ICD-10-CM

## 2021-05-12 DIAGNOSIS — E559 Vitamin D deficiency, unspecified: Secondary | ICD-10-CM

## 2021-05-12 DIAGNOSIS — F419 Anxiety disorder, unspecified: Secondary | ICD-10-CM

## 2021-05-12 DIAGNOSIS — F4323 Adjustment disorder with mixed anxiety and depressed mood: Secondary | ICD-10-CM | POA: Diagnosis not present

## 2021-05-12 DIAGNOSIS — E782 Mixed hyperlipidemia: Secondary | ICD-10-CM | POA: Diagnosis not present

## 2021-05-12 DIAGNOSIS — Z79899 Other long term (current) drug therapy: Secondary | ICD-10-CM | POA: Diagnosis not present

## 2021-05-12 DIAGNOSIS — G500 Trigeminal neuralgia: Secondary | ICD-10-CM

## 2021-05-12 DIAGNOSIS — U099 Post covid-19 condition, unspecified: Secondary | ICD-10-CM

## 2021-05-12 LAB — CBC WITH DIFFERENTIAL/PLATELET
Basophils Absolute: 0 10*3/uL (ref 0.0–0.1)
Basophils Relative: 0.9 % (ref 0.0–3.0)
Eosinophils Absolute: 0.2 10*3/uL (ref 0.0–0.7)
Eosinophils Relative: 2.9 % (ref 0.0–5.0)
HCT: 40.5 % (ref 36.0–46.0)
Hemoglobin: 13.1 g/dL (ref 12.0–15.0)
Lymphocytes Relative: 34.5 % (ref 12.0–46.0)
Lymphs Abs: 1.8 10*3/uL (ref 0.7–4.0)
MCHC: 32.4 g/dL (ref 30.0–36.0)
MCV: 86.1 fl (ref 78.0–100.0)
Monocytes Absolute: 0.5 10*3/uL (ref 0.1–1.0)
Monocytes Relative: 8.8 % (ref 3.0–12.0)
Neutro Abs: 2.8 10*3/uL (ref 1.4–7.7)
Neutrophils Relative %: 52.9 % (ref 43.0–77.0)
Platelets: 234 10*3/uL (ref 150.0–400.0)
RBC: 4.7 Mil/uL (ref 3.87–5.11)
RDW: 14.8 % (ref 11.5–15.5)
WBC: 5.3 10*3/uL (ref 4.0–10.5)

## 2021-05-12 LAB — COMPREHENSIVE METABOLIC PANEL
ALT: 13 U/L (ref 0–35)
AST: 20 U/L (ref 0–37)
Albumin: 4.3 g/dL (ref 3.5–5.2)
Alkaline Phosphatase: 83 U/L (ref 39–117)
BUN: 15 mg/dL (ref 6–23)
CO2: 31 mEq/L (ref 19–32)
Calcium: 9.4 mg/dL (ref 8.4–10.5)
Chloride: 104 mEq/L (ref 96–112)
Creatinine, Ser: 0.79 mg/dL (ref 0.40–1.20)
GFR: 68.74 mL/min (ref >60.00)
Glucose, Bld: 107 mg/dL — ABNORMAL HIGH (ref 70–99)
Potassium: 4 mEq/L (ref 3.5–5.1)
Sodium: 143 mEq/L (ref 135–145)
Total Bilirubin: 0.5 mg/dL (ref 0.2–1.2)
Total Protein: 7.2 g/dL (ref 6.0–8.3)

## 2021-05-12 LAB — VITAMIN B12: Vitamin B-12: 666 pg/mL (ref 211–911)

## 2021-05-12 LAB — HEMOGLOBIN A1C: Hgb A1c MFr Bld: 6.2 % (ref 4.6–6.5)

## 2021-05-12 LAB — TSH: TSH: 2.97 u[IU]/mL (ref 0.35–5.50)

## 2021-05-12 LAB — VITAMIN D 25 HYDROXY (VIT D DEFICIENCY, FRACTURES): VITD: 45.37 ng/mL (ref 30.00–100.00)

## 2021-05-12 MED ORDER — FAMOTIDINE 40 MG PO TABS: 40.0000 mg | ORAL_TABLET | Freq: Every day | ORAL | 3 refills | Status: DC

## 2021-05-12 NOTE — Assessment & Plan Note (Signed)
Added B12 to lab order ?

## 2021-05-12 NOTE — Assessment & Plan Note (Signed)
Level ordered ?Takes D3 ? ?Disc imp to bone and overall health ?

## 2021-05-12 NOTE — Progress Notes (Signed)
? ?Subjective:  ? ? Patient ID: [name] [name], female    DOB: [birth year], 87 y.o.   MRN: [id number] ? ?HPI ?Pt presents for amw and annual follow up of chronic medical problems ? ?I have personally reviewed the Medicare Annual Wellness questionnaire and have noted ?1. The patient's medical and social history ?2. Their use of alcohol, tobacco or illicit drugs ?3. Their current medications and supplements ?4. The patient's functional ability including ADL's, fall risks, home safety risks and hearing or visual ?            impairment. ?5. Diet and physical activities ?6. Evidence for depression or mood disorders ? ?The patients weight, height, BMI have been recorded in the chart and visual acuity is per eye clinic.  ?I have made referrals, counseling and provided education to the patient based review of the above and I have provided the pt with a written personalized care plan for preventive services. ?Reviewed and updated provider list, see scanned forms. ? ?See scanned forms.  Routine anticipatory guidance given to patient.  See health maintenance. ?Colon cancer screening cologuard neg 2021  ?Breast cancer screening  mammogram 2021-declines due to age  ?Self breast exam: no lumps  ?Flu vaccine fall ?Tetanus vaccine postponed for ins ?Pneumovax utd ?Zoster vaccine utd ?Dexa 09/2019 mixed trend with osteopenia  (elam) ?Falls: none ?Fractures: none ?Supplements: vit D and ca  ?Exercise : limited mobility/walker  ? ?Seeing derm tomorrow for lesion on R elbow/hurts ? ?Advance directive: has this up to date  ?Cognitive function addressed- see scanned forms- and if abnormal then additional documentation follows.  ? ?Keeping an eye on this  ?Thinks she is getting worse  ?Puts things in the refrigerator that should not be  ?Trouble focusing-can not organize things  ?Not paranoid (but her son wants to get her help at home)  ? ?Has not become confused ?Has not become lost  ? ?PMH and SH reviewed ? ?Meds, vitals, and  allergies reviewed.  ? ?ROS: See HPI.  Otherwise negative.   ? ?Weight : ?Wt Readings from Last 3 Encounters:  ?05/12/21 201 lb 2 oz (91.2 kg)  ?05/07/21 198 lb 3.2 oz (89.9 kg)  ?03/02/21 199 lb (90.3 kg)  ? ?35.07 kg/m? ? ?Appetite is good  ?Eating is very limited due to bad reflux  ? ?Hearing/vision: ?Hearing Screening - Comments:: Pt wears hearing aids ?Vision Screening - Comments:: eye exam with Dr. Bing Plume in Jan 2023 ? ? ?PHQ: ? ?  05/12/2021  ? 11:34 AM 05/11/2020  ? 10:56 AM 04/29/2019  ? 11:27 AM 02/09/2018  ?  1:39 PM 02/08/2017  ? 12:53 PM  ?Depression screen PHQ 2/9  ?Decreased Interest 0 0 0 0 3  ?Down, Depressed, Hopeless 0 0 0 2 3  ?PHQ - 2 Score 0 0 0 2 6  ?Altered sleeping    0 3  ?Tired, decreased energy    0 3  ?Change in appetite    1 3  ?Feeling bad or failure about yourself     1 1  ?Trouble concentrating    0 2  ?Moving slowly or fidgety/restless    0 0  ?Suicidal thoughts    0 0  ?PHQ-9 Score    4 18  ?Difficult doing work/chores    Not difficult at all Very difficult  ? ?Thinks depression is worse  ?Wants to discuss that  ?Makes her cognition worse  ? ?Nods off easily  ?No h/o sleep apnea  ?Wears  02 at night  ? ? ?ADLs: fairly independent at her son's house ? ?Functionality: mobility issues limit this  ? ?Care team : ?Shaneya Taketa: pcp ?Digby-oph ?Kuzma-ortho/hand ?Alusio-ortho ?Ramaswamy-pulm ?Burt Knack -cardiology ? ? ?Gabapentin for trigeminal neuralgia  ?Still has headaches but no worse  ? ?Plavix for past h/o CVA ? ?Palpitations  in setting of CHF and chronic sob and chronic precoridal cp ?Saw cardiology recently  ? ? ?BP Readings from Last 3 Encounters:  ?05/12/21 118/68  ?05/07/21 (!) 108/50  ?03/02/21 116/64  ? ?Pulse Readings from Last 3 Encounters:  ?05/12/21 64  ?05/07/21 78  ?03/02/21 70  ? ? ? ?GERD: nexium 40 mg  ?Her reflux is bad  ?It limits her from eating much (everything comes back up) ?Was on extra med and it helped  ?Was better on pepcid ? ? ?Zoloft -long time / ? Still working   ? ?Hyperlipidemia ?Lab Results  ?Component Value Date  ? CHOL 186 02/16/2021  ? CHOL 163 03/19/2020  ? CHOL 167 04/29/2019  ? ?Lab Results  ?Component Value Date  ? HDL 72 02/16/2021  ? HDL 65.20 03/19/2020  ? HDL 69.90 04/29/2019  ? ?Lab Results  ?Component Value Date  ? [place] 96 02/16/2021  ? [place] 73 03/19/2020  ? [place] 78 04/29/2019  ? ?Lab Results  ?Component Value Date  ? TRIG 103 02/16/2021  ? TRIG 123.0 03/19/2020  ? TRIG 96.0 04/29/2019  ? ?Lab Results  ?Component Value Date  ? CHOLHDL 2.6 02/16/2021  ? CHOLHDL 3 03/19/2020  ? CHOLHDL 2 04/29/2019  ? ?No results found for: LDLDIRECT ? ?Atorvastatin 10 mg daily  ? ?Prediabetes ?Lab Results  ?Component Value Date  ? HGBA1C 6.1 03/19/2020  ? ?Due for re check  ? ?Patient Active Problem List  ? Diagnosis Date Noted  ? Current use of proton pump inhibitor 05/12/2021  ? Palpitation 05/12/2021  ? Precordial chest pain 05/07/2021  ? Aortic atherosclerosis ([place]) 05/07/2021  ? Coronary artery calcification seen on CT scan 05/07/2021  ? Acute cystitis with hematuria 03/02/2021  ? Myalgia 12/09/2019  ? 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 Chronic tension-type headache, not intractable 09/06/2019  ? Ageusia 09/06/2019  ? Anosmia 09/06/2019  ? Trigeminal neuralgia 03/15/2019  ? Mobility impaired 12/11/2018  ? Poor balance 08/13/2018  ? Memory loss 06/27/2018  ? Physical deconditioning 03/19/2018  ? Chronic heart failure with preserved ejection fraction ([place]) 02/09/2018  ? Prediabetes 01/26/2017  ? Hearing loss 01/19/2016  ? Carpal tunnel syndrome 08/31/2015  ? Encounter for Medicare annual wellness exam 06/04/2014  ? Obesity 06/04/2014  ? Estrogen deficiency 06/04/2014  ? Colon cancer screening 06/04/2014  ? Schatzki's ring 03/27/2014  ? OA (osteoarthritis) of knee 08/16/2011  ? Hemorrhoids 08/10/2011  ? Other dyspnea and respiratory abnormality 06/21/2010  ? Urinary incontinence 05/27/2010  ? OVERACTIVE BLADDER 03/12/2010  ? Lumbar  degenerative disc disease 04/13/2009  ? GERD 10/15/2008  ? History of cardiovascular disorder 10/15/2008  ? Arthropathy, multiple sites 06/26/2008  ? EXTERNAL HEMORRHOIDS 02/01/2008  ? HIATAL HERNIA 02/01/2008  ? DIVERTICULOSIS OF COLON 02/01/2008  ? GASTRITIS, HX OF 02/01/2008  ? DYSPNEA 09/19/2007  ? Vitamin D deficiency 07/03/2007  ? Osteoporosis of lumbar spine 06/13/2007  ? Fatigue 06/13/2007  ? HELICOBACTER PYLORI INFECTION 06/06/2006  ? Hyperlipidemia 06/06/2006  ? Generalized anxiety disorder 06/06/2006  ? Adjustment disorder with mixed anxiety and depressed mood 06/06/2006  ? MACULAR DEGENERATION 06/06/2006  ? H/O: CVA (cerebrovascular accident) 06/06/2006  ? OSTEOARTHRITIS  06/06/2006  ? ?Past Medical History:  ?Diagnosis Date  ? Alternating constipation and diarrhea   ? Anxiety state, unspecified   ? Complication of anesthesia   ? slow to wake up with surgery several years ago   ? COVID-19 virus infection 12/2018  ? Depressive disorder, not elsewhere classified   ? Diaphragmatic hernia without mention of obstruction or gangrene   ? Difficulty sleeping   ? Diverticulosis of colon (without mention of hemorrhage)   ? Esophageal reflux   ? External hemorrhoids without mention of complication   ? Fatigue   ? Frequency of urination   ? Helicobacter pylori (H. pylori)   ? Hiatal hernia   ? History of transfusion   ? Lumbar spondylosis   ? Macular degeneration (senile) of retina, unspecified   ? Osteoarthrosis, unspecified whether generalized or localized, unspecified site 08-11-11  ? hx. "rhematoid arthritis", osteoarthritis, DDD, bursitis(hip)  ? Osteoporosis   ? Palpitations   ? PONV (postoperative nausea and vomiting)   ? Schatzki's ring   ? Shortness of breath 08-11-11  ? with exertion only at present  ? Unspecified cerebral artery occlusion with cerebral infarction 08-11-11  ? '97/ '06( TIA)-affected lt. side, slight weakness L side  ? Unspecified vitamin D deficiency   ? ?Past Surgical History:  ?Procedure  Laterality Date  ? ABDOMINAL HYSTERECTOMY  08-11-11  ? APPENDECTOMY    ? BACK SURGERY  08-11-11  ? '11-hx. lumbar fusion with retained hardware  ? CATARACT EXTRACTION  08-11-11  ? Bilateral  ? ESOPHAGEAL DILATION    ? KNEE SURG

## 2021-05-12 NOTE — Assessment & Plan Note (Signed)
Disc goals for lipids and reasons to control them ?Rev last labs with pt ?Rev low sat fat diet in detail ?Stable ?Continues atorvastatn 10 mg daily ?

## 2021-05-12 NOTE — Assessment & Plan Note (Signed)
Per pt severe and limits her intake  ?Taking nexium 40 mg bid  ?Was better when she took pepcid 40 mg also  ?Will refill this  ?Strongly enc her to f/u with GI (she is apprehensive and has transportation issues) ? ? ?

## 2021-05-12 NOTE — Assessment & Plan Note (Signed)
No changes ?Continues plavix ?

## 2021-05-12 NOTE — Assessment & Plan Note (Signed)
Distressing to pt ?No doubt depression adds, will f/u for virtual visit for this  ?Less socialization  ? ?Would like ref to neurology  ? ?

## 2021-05-12 NOTE — Patient Instructions (Addendum)
I will refer you to neurology to discuss memory change and cognition  ? ?It will take a while  ?You will get a call  ?If you don't hear in a month then let us know  ? ?Schedule a virtual follow up to discuss depression/mood  ? ?Get back on pepcid for upper GI problems  ?It sounds like you need a follow up with GI (Amy [facility]wood) when you are ready to make an appt  ? ?Take care of yourself  ? ?Try to socialize more /phone and computer or emailing really help  ?Would you consider some talk therapy  ? ?Labs today  ? ? ? ?

## 2021-05-12 NOTE — Assessment & Plan Note (Signed)
Still feels tired and sob ?

## 2021-05-12 NOTE — Assessment & Plan Note (Signed)
Ongoing ?Sees cardiology  ? ?Lab today ?

## 2021-05-12 NOTE — Assessment & Plan Note (Signed)
Reviewed health habits including diet and exercise and skin cancer prevention ?Reviewed appropriate screening tests for age  ?Also reviewed health mt list, fam hx and immunization status , as well as social and family history   ?See HPI ?Labs ordered ?cologuard utd ?Declines mammograms  ?Tetanus postponed for ins  ?dexa utd 09/2019 ?No falls or fx, takes vit D ?Derm visit planned tomorrow ?Adv directive utd ?Cognitive status worse per pt/ req neuro ref  ?Depression may play a role, virtual visit planned and given info on CBT ?utd hearing and vision care  ?

## 2021-05-12 NOTE — Assessment & Plan Note (Signed)
More depressed but PHQ score is 0 ?Has been on zoloft long term ?Reviewed stressors/ coping techniques/symptoms/ support sources/ tx options and side effects in detail today ?Frustrated re: living with son and dependent on someone ?Is withdrawing  ?Needs more socialization  ?Disc opt of CBT and handout given  ?This may add to cognition and memory complaints  ?Scheduled virtual visit to discuss further  ?

## 2021-05-12 NOTE — Assessment & Plan Note (Signed)
Continues gabapentin  200 mg tid  ?No clinical changes ?

## 2021-05-12 NOTE — Assessment & Plan Note (Signed)
Discussed how this problem influences overall health and the risks it imposes  ?Reviewed plan for weight loss with lower calorie diet (via better food choices and also portion control or program like weight watchers) and exercise building up to or more than 30 minutes 5 days per week including some aerobic activity  ? ?Is mobility impaired ?Diet is limited due to severe gerd ?

## 2021-05-12 NOTE — Assessment & Plan Note (Signed)
cologuard utd from 2021 ?

## 2021-05-12 NOTE — Assessment & Plan Note (Signed)
Hearing aides currently ?

## 2021-05-12 NOTE — Assessment & Plan Note (Signed)
utd dexa -osteopenia 09/2019 ?No new falls or fx ?Takes ca and D ?Limited mobility for exercise ?

## 2021-05-12 NOTE — Assessment & Plan Note (Signed)
A1C ordered °disc imp of low glycemic diet and wt loss to prevent DM2  °

## 2021-05-13 ENCOUNTER — Encounter: Payer: Self-pay | Admitting: *Deleted

## 2021-05-13 DIAGNOSIS — D485 Neoplasm of uncertain behavior of skin: Secondary | ICD-10-CM | POA: Diagnosis not present

## 2021-05-13 DIAGNOSIS — L853 Xerosis cutis: Secondary | ICD-10-CM | POA: Diagnosis not present

## 2021-05-13 DIAGNOSIS — D0461 Carcinoma in situ of skin of right upper limb, including shoulder: Secondary | ICD-10-CM | POA: Diagnosis not present

## 2021-05-13 DIAGNOSIS — L821 Other seborrheic keratosis: Secondary | ICD-10-CM | POA: Diagnosis not present

## 2021-05-14 ENCOUNTER — Ambulatory Visit: Payer: Medicare Other | Admitting: Physician Assistant

## 2021-05-15 DIAGNOSIS — Z20822 Contact with and (suspected) exposure to covid-19: Secondary | ICD-10-CM | POA: Diagnosis not present

## 2021-05-18 ENCOUNTER — Telehealth (HOSPITAL_COMMUNITY): Payer: Self-pay

## 2021-05-18 ENCOUNTER — Other Ambulatory Visit: Payer: Self-pay | Admitting: Family Medicine

## 2021-05-18 DIAGNOSIS — Z20822 Contact with and (suspected) exposure to covid-19: Secondary | ICD-10-CM | POA: Diagnosis not present

## 2021-05-18 NOTE — Telephone Encounter (Signed)
Spoke wit the patient, detailed instructions given. She stated that she would be here for her test. Asked to call back with any questions. S.Alcario Tinkey EMTP ?

## 2021-05-20 ENCOUNTER — Encounter: Payer: Self-pay | Admitting: Physician Assistant

## 2021-05-20 ENCOUNTER — Ambulatory Visit (HOSPITAL_COMMUNITY): Payer: Medicare Other | Attending: Internal Medicine

## 2021-05-20 DIAGNOSIS — R072 Precordial pain: Secondary | ICD-10-CM | POA: Insufficient documentation

## 2021-05-20 LAB — MYOCARDIAL PERFUSION IMAGING
LV dias vol: 61 mL (ref 46–106)
LV sys vol: 18 mL
Nuc Stress EF: 70 %
Peak HR: 85 {beats}/min
Rest HR: 61 {beats}/min
Rest Nuclear Isotope Dose: 10.3 mCi
SDS: 1
SRS: 0
SSS: 1
ST Depression (mm): 0 mm
Stress Nuclear Isotope Dose: 29.5 mCi
TID: 1.06

## 2021-05-20 MED ORDER — REGADENOSON 0.4 MG/5ML IV SOLN
0.4000 mg | Freq: Once | INTRAVENOUS | Status: AC
  Administered 2021-05-20: 0.4 mg via INTRAVENOUS

## 2021-05-20 MED ORDER — TECHNETIUM TC 99M TETROFOSMIN IV KIT
29.5000 | PACK | Freq: Once | INTRAVENOUS | Status: AC | PRN
  Administered 2021-05-20: 29.5 via INTRAVENOUS
  Filled 2021-05-20: qty 30

## 2021-05-20 MED ORDER — TECHNETIUM TC 99M TETROFOSMIN IV KIT
10.3000 | PACK | Freq: Once | INTRAVENOUS | Status: AC | PRN
  Administered 2021-05-20: 10.3 via INTRAVENOUS
  Filled 2021-05-20: qty 11

## 2021-05-24 NOTE — Progress Notes (Signed)
Pt has been made aware of normal result and verbalized understanding.  jw

## 2021-06-10 DIAGNOSIS — H353132 Nonexudative age-related macular degeneration, bilateral, intermediate dry stage: Secondary | ICD-10-CM | POA: Diagnosis not present

## 2021-06-14 ENCOUNTER — Other Ambulatory Visit: Payer: Self-pay | Admitting: Family Medicine

## 2021-06-15 NOTE — Telephone Encounter (Signed)
Last filled on 03/15/21 #90 tabs, CPE was on 05/12/21

## 2021-07-02 ENCOUNTER — Other Ambulatory Visit: Payer: Self-pay | Admitting: Family Medicine

## 2021-07-03 ENCOUNTER — Encounter: Payer: Self-pay | Admitting: Family Medicine

## 2021-07-05 ENCOUNTER — Other Ambulatory Visit: Payer: Self-pay | Admitting: Family

## 2021-07-05 NOTE — Telephone Encounter (Signed)
Medication is no longer on medication list. No record of recent refills Last office visit 05/12/21

## 2021-07-07 MED ORDER — ESTRADIOL 0.1 MG/GM VA CREA: TOPICAL_CREAM | VAGINAL | 0 refills | Status: DC

## 2021-07-10 DIAGNOSIS — H353132 Nonexudative age-related macular degeneration, bilateral, intermediate dry stage: Secondary | ICD-10-CM | POA: Diagnosis not present

## 2021-07-21 ENCOUNTER — Other Ambulatory Visit: Payer: Self-pay | Admitting: Family Medicine

## 2021-07-21 NOTE — Telephone Encounter (Signed)
Is this refill early?

## 2021-07-21 NOTE — Telephone Encounter (Signed)
04/12/23last filled

## 2021-07-22 NOTE — Telephone Encounter (Signed)
Mail order usually asks a few weeks early to make sure they get it processed and out to the pt. Looks like it was last written 02-08-21 #540 and 1 refill.

## 2021-08-10 ENCOUNTER — Encounter: Payer: Self-pay | Admitting: Family Medicine

## 2021-08-10 DIAGNOSIS — N3946 Mixed incontinence: Secondary | ICD-10-CM

## 2021-08-10 DIAGNOSIS — N318 Other neuromuscular dysfunction of bladder: Secondary | ICD-10-CM

## 2021-08-16 ENCOUNTER — Other Ambulatory Visit: Payer: Self-pay | Admitting: Cardiovascular Disease

## 2021-09-07 NOTE — Therapy (Addendum)
OUTPATIENT PHYSICAL THERAPY FEMALE PELVIC EVALUATION   Patient Name: [name] [name] MRN: [id number] DOB:11-Jul-1934, 87 y.o., female Today's Date: 09/08/2021   PT End of Session - 09/08/21 1218     Visit Number 1    Date for PT Re-Evaluation 12/01/21    PT Start Time 1205    PT Stop Time 0600    PT Time Calculation (min) 60 min    Activity Tolerance Patient tolerated treatment well    Behavior During Therapy [facility] for tasks assessed/performed             Past Medical History:  Diagnosis Date   Alternating constipation and diarrhea    Anxiety state, unspecified    Complication of anesthesia    slow to wake up with surgery several years ago    COVID-19 virus infection 12/2018   Depressive disorder, not elsewhere classified    Diaphragmatic hernia without mention of obstruction or gangrene    Difficulty sleeping    Diverticulosis of colon (without mention of hemorrhage)    Esophageal reflux    External hemorrhoids without mention of complication    Fatigue    Frequency of urination    Helicobacter pylori (H. pylori)    Hiatal hernia    History of nuclear stress test    Myoview 05/2021: EF 70, normal perfusion; low risk   History of transfusion    Lumbar spondylosis    Macular degeneration (senile) of retina, unspecified    Osteoarthrosis, unspecified whether generalized or localized, unspecified site 08/11/2011   hx. "rhematoid arthritis", osteoarthritis, DDD, bursitis(hip)   Osteoporosis    Palpitations    PONV (postoperative nausea and vomiting)    Schatzki's ring    Shortness of breath 08/11/2011   with exertion only at present   Unspecified cerebral artery occlusion with cerebral infarction 08/11/2011   '97/ '06( TIA)-affected lt. side, slight weakness L side   Unspecified vitamin D deficiency    Past Surgical History:  Procedure Laterality Date   ABDOMINAL HYSTERECTOMY  08-11-11   APPENDECTOMY     BACK SURGERY  08-11-11   '11-hx. lumbar fusion with  retained hardware   CATARACT EXTRACTION  08-11-11   Bilateral   ESOPHAGEAL DILATION     KNEE SURGERY  08-11-11   rt. knee scope   PATELLAR TENDON REPAIR Left 02/22/2014   Procedure: PATELLA TENDON REPAIR;  Surgeon: Gearlean Alf, MD;  Location: [facility];  Service: Orthopedics;  Laterality: Left;   PATELLAR TENDON REPAIR Left 06/25/2014   Procedure: LEFT PATELLA TENDON REPAIR;  Surgeon: Gaynelle Arabian, MD;  Location: [facility];  Service: Orthopedics;  Laterality: Left;   TOTAL KNEE ARTHROPLASTY  08/16/2011   Procedure: TOTAL KNEE ARTHROPLASTY;  Surgeon: Gearlean Alf, MD;  Location: [facility];  Service: Orthopedics;  Laterality: Right;   TOTAL KNEE ARTHROPLASTY Left 01/27/2014   Procedure: LEFT TOTAL KNEE ARTHROPLASTY;  Surgeon: Gearlean Alf, MD;  Location: [facility];  Service: Orthopedics;  Laterality: Left;   TUBAL LIGATION     Patient Active Problem List   Diagnosis Date Noted   Current use of proton pump inhibitor 05/12/2021   Palpitation 05/12/2021   Precordial chest pain 05/07/2021   Aortic atherosclerosis ([place]) 05/07/2021   Coronary artery calcification seen on CT scan 05/07/2021   Acute cystitis with hematuria 03/02/2021   Myalgia 12/09/2019   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 12/09/2019   Chronic tension-type headache, not intractable 09/06/2019   Ageusia 09/06/2019   Anosmia 09/06/2019  Trigeminal neuralgia 03/15/2019   Mobility impaired 12/11/2018   Poor balance 08/13/2018   Memory loss 06/27/2018   Physical deconditioning 03/19/2018   Chronic heart failure with preserved ejection fraction ([place]) 02/09/2018   Prediabetes 01/26/2017   Hearing loss 01/19/2016   Carpal tunnel syndrome 08/31/2015   Encounter for Medicare annual wellness exam 06/04/2014   Obesity 06/04/2014   Estrogen deficiency 06/04/2014   Colon cancer screening 06/04/2014   Schatzki's ring 03/27/2014   OA (osteoarthritis) of knee 08/16/2011   Hemorrhoids 08/10/2011   Other dyspnea and  respiratory abnormality 06/21/2010   Mixed incontinence 05/27/2010   OVERACTIVE BLADDER 03/12/2010   Lumbar degenerative disc disease 04/13/2009   GERD 10/15/2008   History of cardiovascular disorder 10/15/2008   Arthropathy, multiple sites 06/26/2008   EXTERNAL HEMORRHOIDS 02/01/2008   HIATAL HERNIA 02/01/2008   DIVERTICULOSIS OF COLON 02/01/2008   GASTRITIS, HX OF 02/01/2008   DYSPNEA 09/19/2007   Vitamin D deficiency 07/03/2007   Osteoporosis of lumbar spine 06/13/2007   Fatigue 78/24/2353   HELICOBACTER PYLORI INFECTION 06/06/2006   Hyperlipidemia 06/06/2006   Generalized anxiety disorder 06/06/2006   Adjustment disorder with mixed anxiety and depressed mood 06/06/2006   MACULAR DEGENERATION 06/06/2006   H/O: CVA (cerebrovascular accident) 06/06/2006   OSTEOARTHRITIS 06/06/2006    PCP: Abner Greenspan, MD  REFERRING PROVIDER: Abner Greenspan, MD  REFERRING DIAG:  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 A year ago I wasn't wearing depends because I knew when I was going to have to go. During the day I can wait 3 hours to go.  Nocturia is 2x in the middle of the night.  My body gets numb at night.  Pt reports that her husband passed away 3 years ago and she used to travel and do more activities.  She reports that in the last year she moved in with her son and it is hard for her because she went from taking care of  everything at home to not doing any cleaning and housework. Fluid intake: Yes: sweet tea, beet juice,  energy drink (verve)     PAIN:  Are you having pain? No  PRECAUTIONS: None  WEIGHT BEARING RESTRICTIONS No  FALLS:  Has patient fallen in last 6 months? No  LIVING ENVIRONMENT: Lives with: lives with their family and lives with their son Lives in: House/apartment OCCUPATION: no  PLOF: Independent  PATIENT GOALS be able control urge, be able to not wear depends, reduce nocturia  PERTINENT HISTORY:  constipation/diarrhea, diaphragmatic hernia, osteoporosis; PMH of TKA bil, abdomial hysterectomy, lumbar fusion with hardware Sexual abuse: No  BOWEL MOVEMENT Pain with bowel movement: Yes Type of bowel movement:Type (Bristol Stool Scale) loose stools, Frequency 1 at least daily, and Strain No Fully empty rectum: Yes:   Leakage: No Pads: No Fiber supplement: probiotics  URINATION Pain with urination: No Fully empty bladder: Yes: but have to sit longer Stream: Strong Urgency: Yes:   Frequency: normal other than at night Leakage: Urge to void and Walking to the bathroom usually 2/day Pads: Yes: 1 night in bed and 1 in the morning  INTERCOURSE   PREGNANCY Vaginal deliveries 3 Tearing No   PROLAPSE     OBJECTIVE:   DIAGNOSTIC FINDINGS:    PATIENT SURVEYS:    PFIQ-7 = 52  COGNITION:  Overall cognitive status: Within functional limits for tasks assessed     SENSATION:    MUSCLE LENGTH: Hamstrings: Right 60 deg; Left 60 deg   LUMBAR SPECIAL TESTS:    FUNCTIONAL TESTS:  Transfers and sit to stand with increased trunk flexion, increased time and UE support needed  GAIT: Distance walked: from waiting area to room 4 Assistive device utilized: Single point cane Level of assistance: Min A - UE support x1 on PT Comments: very flexed, antalgic on left, smaller stride length               POSTURE: rounded shoulders, forward head, decreased lumbar  lordosis, flexed trunk , and weight shift right   PELVIC ALIGNMENT:  LUMBARAROM/PROM  A/PROM A/PROM  eval  Flexion 20%  Extension 0%  Right lateral flexion   Left lateral flexion   Right rotation   Left rotation    (Blank rows = not tested)  LOWER EXTREMITY ROM:  Passive ROM Right eval Left eval  Hip flexion 50% 50%  Hip extension    Hip abduction    Hip adduction    Hip internal rotation    Hip external rotation 50% 50%  Knee flexion    Knee extension    Ankle dorsiflexion    Ankle plantarflexion    Ankle inversion    Ankle eversion     (Blank rows = not tested)  LOWER EXTREMITY MMT:  MMT Right eval Left eval  Hip flexion 5/5   Hip extension    Hip abduction    Hip adduction    Hip internal rotation 5/5 4/5  Hip external rotation 4/5 4/5  Knee flexion    Knee extension    Ankle dorsiflexion    Ankle plantarflexion    Ankle inversion    Ankle eversion      PALPATION:   General tight lumbar and thoracic paraspinals, upper traps tight; abdominal wall decreased tone                External Perineal Exam normal good vulvar skin condition with pink color                             Internal  Pelvic Floor indroitus and tissues around hymen remnants are tight and stenosis present   Patient confirms identification and approves PT to assess internal pelvic floor and treatment Yes No emotional/communication barriers or cognitive limitation. Patient is motivated to learn. Patient understands and agrees with treatment goals and plan. PT explains patient will be examined in standing, sitting, and lying down to see how their muscles and joints work. When they are ready, they will be asked to remove their underwear so PT can examine their perineum. The patient is also given the option of providing their own chaperone as one is not provided in our facility. The patient also has the right and is explained the right to defer or refuse any part of the evaluation or treatment  including the internal exam. With the patient's consent, PT will use one gloved finger to gently assess the muscles of the pelvic floor, seeing how well it contracts and relaxes and if there is muscle symmetry. After, the patient will get dressed and PT and patient will discuss exam findings and plan of care. PT and patient discuss plan of care, schedule, attendance policy and HEP activities.   PELVIC MMT:   MMT eval  Vaginal 2/5 for 4 reps; can hold up to 2 seconds  Internal Anal Sphincter   External Anal Sphincter   Puborectalis   Diastasis Recti   (Blank rows = not tested)        TONE: Slightly high levator muscles  PROLAPSE: None observed in supine with valsalva  TODAY'S TREATMENT   Treatment: 09/08/21  [place] Re-ed Education and cues for coordination of breathing and pelvic floor muscle contracting and relaxing at appropriate times  Self care activities: using estrogen and apply with finger to get it into the tissues    PATIENT EDUCATION:  Education details: estrogen application and urge techniques Person educated: Patient Education method: Explanation, Demonstration, Verbal cues, and Handouts Education comprehension: verbalized understanding   HOME EXERCISE PROGRAM:   ASSESSMENT:  CLINICAL IMPRESSION: Patient is a pleasant 87 y.o. female who was seen today for physical therapy evaluation and treatment for urinary urge and frequency. Pt has posture and gait impairments as mentioned above. Pt has core and pelvic floor weakness as mentioned above.  Pt ambulates with a cane and UE support min assist from PT for balance.  Pt has difficulty walking and standing and can do for short periods of time.  Pt has pelvic floor 2/5 MMT 4 reps and 2 sec hold max.  Pt is expected to progress slowly due to higher amount of comorbidities.  Pt will benefit from skilled PT to address impairments and improve overall function and return to higher level of activities.   OBJECTIVE  IMPAIRMENTS decreased endurance, difficulty walking, decreased ROM, decreased strength, increased fascial restrictions, impaired flexibility, postural dysfunction, and pain.   ACTIVITY LIMITATIONS lifting, standing, and continence  PARTICIPATION LIMITATIONS: meal prep, cleaning, shopping, and community activity  PERSONAL FACTORS 3+ comorbidities: constipation/diarrhea, diaphragmatic hernia, osteoporosis; PMH of TKA bil, abdomial hysterectomy, lumbar fusion with hardware  are also affecting patient's functional outcome.   REHAB POTENTIAL: Excellent  CLINICAL DECISION MAKING: Evolving/moderate complexity  EVALUATION COMPLEXITY: Moderate   GOALS: Goals reviewed with patient? Yes  SHORT TERM GOALS: Target date: 10/06/2021  Pt will be independent with initial HEP.  Baseline: Goal status: INITIAL  2.  Pt will notice 50% more time to get to the bathroom after the urge Baseline:  Goal status: INITIAL    LONG TERM GOALS: Target date:  12/01/2021   Pt will be independent with advanced HEP to maintain improvements made throughout therapy  Baseline:  Goal status: INITIAL  2.  Pt will report 75% reduction of pain when standing and walking due to improvements in posture, strength, and muscle length  Baseline:  Goal status: INITIAL  3.  Pt will be able to functional actions such as walking to the bathroom without leakage  Baseline:  Goal status: INITIAL  4.  Pt will have to use 1 pads per day  Baseline: 2 Goal status: INITIAL  5.  Pt will feel confident to take a day trip due to reduced leakage and improved mobility Baseline:  Goal status: INITIAL   PLAN: PT FREQUENCY: 1x/week  PT DURATION: 12 weeks  PLANNED INTERVENTIONS: Therapeutic exercises, Therapeutic activity, Neuromuscular re-education, Balance training, Gait training, Patient/Family education, Self Care, Joint mobilization, Dry Needling, Electrical stimulation, Cryotherapy, Moist heat, Taping, Biofeedback, Manual  therapy, and Re-evaluation  PLAN FOR NEXT SESSION: posture, soft tissue mobs to lumbar and gltueals and spine mobility; basic transversus abdominus ex's; review estrogen application   Camillo Flaming Rykker Coviello, PT 09/08/2021, 2:14 PM   PHYSICAL THERAPY DISCHARGE SUMMARY  Visits from Start of Care: 1  Current functional level related to goals / functional outcomes: See above goals - eval only   Remaining deficits: See above   Education / Equipment: HEP   Patient agrees to discharge. Patient goals were not met. Patient is being discharged due to not returning since the last visit.  Gustavus Bryant, PT 10/27/21 3:57 PM

## 2021-09-08 ENCOUNTER — Other Ambulatory Visit: Payer: Self-pay

## 2021-09-08 ENCOUNTER — Encounter: Payer: Self-pay | Admitting: Physical Therapy

## 2021-09-08 ENCOUNTER — Ambulatory Visit: Payer: Medicare Other | Attending: Family Medicine | Admitting: Physical Therapy

## 2021-09-08 DIAGNOSIS — H353132 Nonexudative age-related macular degeneration, bilateral, intermediate dry stage: Secondary | ICD-10-CM | POA: Diagnosis not present

## 2021-09-08 DIAGNOSIS — R262 Difficulty in walking, not elsewhere classified: Secondary | ICD-10-CM | POA: Insufficient documentation

## 2021-09-08 DIAGNOSIS — R293 Abnormal posture: Secondary | ICD-10-CM | POA: Diagnosis not present

## 2021-09-08 DIAGNOSIS — N3946 Mixed incontinence: Secondary | ICD-10-CM | POA: Diagnosis not present

## 2021-09-08 DIAGNOSIS — N318 Other neuromuscular dysfunction of bladder: Secondary | ICD-10-CM | POA: Diagnosis not present

## 2021-09-08 DIAGNOSIS — M6281 Muscle weakness (generalized): Secondary | ICD-10-CM | POA: Insufficient documentation

## 2021-09-24 ENCOUNTER — Emergency Department (HOSPITAL_COMMUNITY)
Admission: EM | Admit: 2021-09-24 | Discharge: 2021-09-24 | Disposition: A | Discharge status: Home / Self Care | Payer: Medicare Other | Attending: Emergency Medicine | Admitting: Emergency Medicine

## 2021-09-24 ENCOUNTER — Telehealth: Payer: Self-pay | Admitting: Physician Assistant

## 2021-09-24 ENCOUNTER — Emergency Department (HOSPITAL_COMMUNITY): Payer: Medicare Other

## 2021-09-24 ENCOUNTER — Other Ambulatory Visit: Payer: Self-pay

## 2021-09-24 DIAGNOSIS — K8020 Calculus of gallbladder without cholecystitis without obstruction: Secondary | ICD-10-CM | POA: Insufficient documentation

## 2021-09-24 DIAGNOSIS — I10 Essential (primary) hypertension: Secondary | ICD-10-CM | POA: Diagnosis not present

## 2021-09-24 DIAGNOSIS — Z91040 Latex allergy status: Secondary | ICD-10-CM | POA: Insufficient documentation

## 2021-09-24 DIAGNOSIS — Z20822 Contact with and (suspected) exposure to covid-19: Secondary | ICD-10-CM | POA: Diagnosis not present

## 2021-09-24 DIAGNOSIS — K5730 Diverticulosis of large intestine without perforation or abscess without bleeding: Secondary | ICD-10-CM | POA: Diagnosis not present

## 2021-09-24 DIAGNOSIS — K449 Diaphragmatic hernia without obstruction or gangrene: Secondary | ICD-10-CM | POA: Diagnosis not present

## 2021-09-24 DIAGNOSIS — I959 Hypotension, unspecified: Secondary | ICD-10-CM | POA: Diagnosis not present

## 2021-09-24 DIAGNOSIS — R0789 Other chest pain: Secondary | ICD-10-CM | POA: Insufficient documentation

## 2021-09-24 DIAGNOSIS — R059 Cough, unspecified: Secondary | ICD-10-CM | POA: Insufficient documentation

## 2021-09-24 DIAGNOSIS — R0602 Shortness of breath: Secondary | ICD-10-CM | POA: Diagnosis not present

## 2021-09-24 DIAGNOSIS — R1013 Epigastric pain: Secondary | ICD-10-CM | POA: Diagnosis not present

## 2021-09-24 DIAGNOSIS — R0902 Hypoxemia: Secondary | ICD-10-CM | POA: Diagnosis not present

## 2021-09-24 DIAGNOSIS — R079 Chest pain, unspecified: Secondary | ICD-10-CM | POA: Diagnosis not present

## 2021-09-24 DIAGNOSIS — G4489 Other headache syndrome: Secondary | ICD-10-CM | POA: Diagnosis not present

## 2021-09-24 LAB — CBC WITH DIFFERENTIAL/PLATELET
Abs Immature Granulocytes: 0.02 10*3/uL (ref 0.00–0.07)
Basophils Absolute: 0.1 10*3/uL (ref 0.0–0.1)
Basophils Relative: 1 %
Eosinophils Absolute: 0.3 10*3/uL (ref 0.0–0.5)
Eosinophils Relative: 4 %
HCT: 38.8 % (ref 36.0–46.0)
Hemoglobin: 12.8 g/dL (ref 12.0–15.0)
Immature Granulocytes: 0 %
Lymphocytes Relative: 22 %
Lymphs Abs: 1.4 10*3/uL (ref 0.7–4.0)
MCH: 28.5 pg (ref 26.0–34.0)
MCHC: 33 g/dL (ref 30.0–36.0)
MCV: 86.4 fL (ref 80.0–100.0)
Monocytes Absolute: 0.6 10*3/uL (ref 0.1–1.0)
Monocytes Relative: 10 %
Neutro Abs: 4 10*3/uL (ref 1.7–7.7)
Neutrophils Relative %: 63 %
Platelets: 243 10*3/uL (ref 150–400)
RBC: 4.49 MIL/uL (ref 3.87–5.11)
RDW: 14.2 % (ref 11.5–15.5)
WBC: 6.3 10*3/uL (ref 4.0–10.5)
nRBC: 0 % (ref 0.0–0.2)

## 2021-09-24 LAB — COMPREHENSIVE METABOLIC PANEL
ALT: 15 U/L (ref 0–44)
AST: 21 U/L (ref 15–41)
Albumin: 3.9 g/dL (ref 3.5–5.0)
Alkaline Phosphatase: 81 U/L (ref 38–126)
Anion gap: 10 (ref 5–15)
BUN: 11 mg/dL (ref 8–23)
CO2: 26 mmol/L (ref 22–32)
Calcium: 9.3 mg/dL (ref 8.9–10.3)
Chloride: 105 mmol/L (ref 98–111)
Creatinine, Ser: 0.93 mg/dL (ref 0.44–1.00)
GFR, Estimated: >60 mL/min (ref >60)
Glucose, Bld: 114 mg/dL — ABNORMAL HIGH (ref 70–99)
Potassium: 3.9 mmol/L (ref 3.5–5.1)
Sodium: 141 mmol/L (ref 135–145)
Total Bilirubin: 0.7 mg/dL (ref 0.3–1.2)
Total Protein: 6.9 g/dL (ref 6.5–8.1)

## 2021-09-24 LAB — URINALYSIS, ROUTINE W REFLEX MICROSCOPIC
Bacteria, UA: NONE SEEN
Bilirubin Urine: NEGATIVE
Glucose, UA: NEGATIVE mg/dL
Hgb urine dipstick: NEGATIVE
Ketones, ur: NEGATIVE mg/dL
Nitrite: NEGATIVE
Protein, ur: NEGATIVE mg/dL
Specific Gravity, Urine: 1.008 (ref 1.005–1.030)
pH: 7 (ref 5.0–8.0)

## 2021-09-24 LAB — LACTIC ACID, PLASMA
Lactic Acid, Venous: 1 mmol/L (ref 0.5–1.9)
Lactic Acid, Venous: 0.8 mmol/L (ref 0.5–1.9)

## 2021-09-24 LAB — LIPASE, BLOOD: Lipase: 42 U/L (ref 11–51)

## 2021-09-24 LAB — SARS CORONAVIRUS 2 BY RT PCR: SARS Coronavirus 2 by RT PCR: NEGATIVE

## 2021-09-24 LAB — TROPONIN I (HIGH SENSITIVITY)
Troponin I (High Sensitivity): 6 ng/L (ref <18)
Troponin I (High Sensitivity): 5 ng/L (ref <18)

## 2021-09-24 MED ORDER — IOHEXOL 350 MG/ML SOLN
100.0000 mL | Freq: Once | INTRAVENOUS | Status: AC | PRN
  Administered 2021-09-24: 100 mL via INTRAVENOUS

## 2021-09-24 NOTE — ED Triage Notes (Addendum)
EMS stated, chest pressure for 2 days. , thinks it might be acid reflux. IV 20g left AC ASA '324mg'$  Nitro x 1  no results. Pt. Stated, Im mostly SOB and have some tingling during the night and have to get up and walk around.

## 2021-09-24 NOTE — Telephone Encounter (Addendum)
Error

## 2021-09-24 NOTE — ED Provider Notes (Signed)
[facility] Provider Note   CSN: [id number] Arrival date & time: 09/24/21  9562     History  Chief Complaint  Patient presents with   Chest Pain   Shortness of Breath    [name] [name] is a 87 y.o. female.  The history is provided by medical records, the patient and a significant other. No language interpreter was used.  Chest Pain Pain location:  Substernal area, epigastric, L chest and R chest Pain quality: pressure and tightness   Pain radiates to:  Epigastrium and neck Pain severity:  Moderate Onset quality:  Gradual Duration:  2 days Timing:  Constant Chronicity:  New Context: not trauma   Relieved by:  Nothing Worsened by:  Nothing Ineffective treatments:  None tried Associated symptoms: abdominal pain (upper epigastric), cough, nausea, shortness of breath and vomiting   Associated symptoms: no altered mental status, no back pain, no diaphoresis, no dizziness, no fatigue, no headache, no lower extremity edema, no numbness, no palpitations and no weakness   Shortness of Breath Associated symptoms: abdominal pain (upper epigastric), chest pain, cough and vomiting   Associated symptoms: no diaphoresis, no headaches, no neck pain, no rash and no wheezing        Home Medications Prior to Admission medications   Medication Sig Start Date End Date Taking? Authorizing Provider  acetaminophen (TYLENOL) 325 MG tablet Take 2 tablets (650 mg total) by mouth every 6 (six) hours as needed for mild pain (or Fever >/= 101). 01/29/14   Perkins, Alexzandrew L, PA-C  APPLE CIDER VINEGAR PO Take 2 tablets by mouth daily.    [provider]  atorvastatin (LIPITOR) 10 MG tablet TAKE 1 TABLET DAILY 07/02/21   Tower, Wynelle Fanny, MD  calcium carbonate (TUMS - DOSED IN MG ELEMENTAL CALCIUM) 500 MG chewable tablet Chew 1 tablet (200 mg of elemental calcium total) by mouth 4 (four) times daily as needed for indigestion or heartburn. 02/25/14    Perkins, Alexzandrew L, PA-C  CALCIUM-MAGNESIUM-ZINC PO Take 1 capsule by mouth daily.    [provider]  clopidogrel (PLAVIX) 75 MG tablet TAKE 1 TABLET DAILY 06/15/21   Tower, [place] A, MD  diclofenac sodium (VOLTAREN) 1 % GEL Apply 1 application topically 2 (two) times daily. 12/22/15   [provider]  esomeprazole (NEXIUM) 40 MG capsule TAKE 1 CAPSULE TWICE A DAY BEFORE MEALS 05/18/21   Tower, Tracy A, MD  estradiol (ESTRACE VAGINAL) 0.1 MG/GM vaginal cream Use a pea sized amount of cream vaginally twice per week 07/07/21   Tower, Wynelle Fanny, MD  famotidine (PEPCID) 40 MG tablet Take 1 tablet (40 mg total) by mouth daily. 05/12/21   Tower, Wynelle Fanny, MD  furosemide (LASIX) 20 MG tablet TAKE 1 TABLET DAILY 11/18/20   Sherren Mocha, MD  gabapentin (NEURONTIN) 100 MG capsule TAKE 2 CAPSULES THREE TIMES A DAY 07/22/21   Tower, Wynelle Fanny, MD  HYDROcodone-acetaminophen (NORCO) 10-325 MG tablet Take 1 tablet by mouth every 6 (six) hours as needed.  05/07/15   [provider]  metoprolol succinate (TOPROL-XL) 25 MG 24 hr tablet TAKE ONE-HALF (1/2) TABLET AT BEDTIME 08/17/21   Weaver, Scott T, PA-C  Multiple Vitamins-Minerals (PRESERVISION AREDS PO) Take 1 capsule by mouth 2 (two) times daily.    [provider]  nitroGLYCERIN (NITROSTAT) 0.4 MG SL tablet Place 1 tablet (0.4 mg total) under the tongue every 5 (five) minutes as needed for chest pain. 05/07/21 08/05/21  Liliane Shi,  PA-C  Polyethyl Glycol-Propyl Glycol (SYSTANE OP) Apply 1-2 drops to eye 3 (three) times daily as needed (dry eyes.).     [provider]  sertraline (ZOLOFT) 100 MG tablet TAKE 2 TABLETS DAILY 04/02/21   Tower, Wynelle Fanny, MD      Allergies    Alendronate sodium, Alendronate sodium, Celecoxib, Omeprazole, Protonix [pantoprazole sodium], Rabeprazole, and Latex    Review of Systems   Review of Systems  Constitutional:  Negative for chills, diaphoresis and fatigue.  HENT:  Negative for congestion.    Respiratory:  Positive for cough, chest tightness and shortness of breath. Negative for wheezing.   Cardiovascular:  Positive for chest pain. Negative for palpitations.  Gastrointestinal:  Positive for abdominal pain (upper epigastric), nausea and vomiting. Negative for abdominal distention, constipation and diarrhea.  Genitourinary:  Negative for dysuria and flank pain.  Musculoskeletal:  Negative for back pain, neck pain and neck stiffness.  Skin:  Negative for rash and wound.  Neurological:  Negative for dizziness, weakness, light-headedness, numbness and headaches.  Psychiatric/Behavioral:  Negative for agitation and confusion.   All other systems reviewed and are negative.   Physical Exam Updated Vital Signs BP 127/60 (BP Location: Left Arm)   Pulse 64   Temp 99.6 F (37.6 C) (Oral)   Resp 16   SpO2 95%  Physical Exam Vitals and nursing note reviewed.  Constitutional:      General: She is not in acute distress.    Appearance: She is well-developed. She is not ill-appearing, toxic-appearing or diaphoretic.  HENT:     Head: Normocephalic and atraumatic.  Eyes:     Conjunctiva/sclera: Conjunctivae normal.     Pupils: Pupils are equal, round, and reactive to light.  Cardiovascular:     Rate and Rhythm: Normal rate and regular rhythm.     Heart sounds: Normal heart sounds. No murmur heard. Pulmonary:     Effort: Pulmonary effort is normal. No respiratory distress.     Breath sounds: Normal breath sounds. No wheezing, rhonchi or rales.  Chest:     Chest wall: No tenderness.  Abdominal:     Palpations: Abdomen is soft.     Tenderness: There is no abdominal tenderness.  Musculoskeletal:        General: No swelling.     Cervical back: Neck supple.     Right lower leg: No tenderness. No edema.     Left lower leg: No tenderness. No edema.  Skin:    General: Skin is warm and dry.     Capillary Refill: Capillary refill takes less than 2 seconds.  Neurological:     General:  No focal deficit present.     Mental Status: She is alert.  Psychiatric:        Mood and Affect: Mood normal.     ED Results / Procedures / Treatments   Labs (all labs ordered are listed, but only abnormal results are displayed) Labs Reviewed  COMPREHENSIVE METABOLIC PANEL - Abnormal; Notable for the following components:      Result Value   Glucose, Bld 114 (*)    All other components within normal limits  URINALYSIS, ROUTINE W REFLEX MICROSCOPIC - Abnormal; Notable for the following components:   Leukocytes,Ua TRACE (*)    All other components within normal limits  SARS CORONAVIRUS 2 BY RT PCR  CBC WITH DIFFERENTIAL/PLATELET  LACTIC ACID, PLASMA  LACTIC ACID, PLASMA  LIPASE, BLOOD  TROPONIN I (HIGH SENSITIVITY)  TROPONIN I (HIGH SENSITIVITY)  EKG EKG Interpretation  Date/Time:  Friday September 24 2021 07:41:22 EDT Ventricular Rate:  65 PR Interval:  164 QRS Duration: 76 QT Interval:  438 QTC Calculation: 455 R Axis:   86 Text Interpretation: Normal sinus rhythm Normal ECG When compared with ECG of 20-May-2021 11:53, PREVIOUS ECG IS PRESENT when compared to prior, similar appearance with less artifact. NO STEMI Confirmed by Antony Blackbird [id number]) on 09/24/2021 8:42:24 AM  Radiology US Abdomen Limited RUQ (LIVER/GB)  Result Date: 09/24/2021 CLINICAL DATA:  Gallbladder stones EXAM: ULTRASOUND ABDOMEN LIMITED RIGHT UPPER QUADRANT COMPARISON:  Sonogram done on 12/03/2008, CT than on 09/24/2021 FINDINGS: Gallbladder: There are multiple gallbladder stones. There is no wall thickening in gallbladder. There is no fluid around the gallbladder. Technologist did not observe any tenderness over the gallbladder. Common bile duct: Diameter: 8.7 mm. Distal common bile duct is not adequately visualized for evaluation. In the CT done earlier today, there was no evidence of any significant bile duct dilation. Liver: There is slightly increased echogenicity in the liver. No focal  abnormalities are seen in visualized portions of liver. Portal vein is patent on color Doppler imaging with normal direction of blood flow towards the liver. Other: None. IMPRESSION: Gallbladder stones.  There are no signs of acute cholecystitis. Electronically Signed   By: Elmer Picker M.D.   On: 09/24/2021 13:36   CT Angio Chest PE W and/or Wo Contrast  Result Date: 09/24/2021 CLINICAL DATA:  Suspected bowel obstruction in an 87 year old female presenting with chest discomfort in epigastric pain, history of hiatal hernia. EXAM: CT ANGIOGRAPHY CHEST CT ABDOMEN AND PELVIS WITH CONTRAST TECHNIQUE: Multidetector CT imaging of the chest was performed using the standard protocol during bolus administration of intravenous contrast. Multiplanar CT image reconstructions and MIPs were obtained to evaluate the vascular anatomy. Multidetector CT imaging of the abdomen and pelvis was performed using the standard protocol during bolus administration of intravenous contrast. RADIATION DOSE REDUCTION: This exam was performed according to the departmental dose-optimization program which includes automated exposure control, adjustment of the mA and/or kV according to patient size and/or use of iterative reconstruction technique. CONTRAST:  119m OMNIPAQUE IOHEXOL 350 MG/ML SOLN COMPARISON:  CT of the chest from February of 2020. No recent abdominal imaging is available for comparison. FINDINGS: CTA CHEST FINDINGS Cardiovascular: The aorta shows calcified atherosclerotic plaque, not well evaluated given that the study is optimized for assessment of pulmonary vasculature. 4 cm ascending thoracic aortic caliber. No stranding adjacent to the aorta. Tortuous great vessel origins in the chest. Mild cardiac enlargement. Enlargement of LEFT atrium and RIGHT heart. 3.5 cm central pulmonary artery caliber. Density of main pulmonary artery is 547 Hounsfield units. Study is adequate for assessment of the pulmonary arterial bed and is  negative for pulmonary embolism Mediastinum/Nodes: No thoracic inlet, axillary, mediastinal or hilar adenopathy. Esophagus grossly normal. Moderate size hiatal hernia is similar to imaging from February of 2020 Lungs/Pleura: Patchy ground-glass attenuation and mild septal thickening. Basilar atelectasis. No consolidation. No pleural effusion. No pneumothorax. Airways mildly slit like with respect to mainstem bronchi. Note that on the abdominal portion of the study the lung bases appear normal and well expanded. Musculoskeletal: Visualized clavicles and scapulae are intact. Sternum is intact. No displaced rib fractures. Costochondral elements are intact. Spinal degenerative changes without acute or destructive sternal deformity also similar to prior imaging indicative of remote trauma to the sternum and spine. Review of the MIP images confirms the above findings. CT ABDOMEN and PELVIS FINDINGS Hepatobiliary: No focal,  suspicious hepatic lesion. No pericholecystic stranding. No biliary duct dilation. Portal vein is patent. Cholelithiasis with distension of the gallbladder but without pericholecystic stranding or wall thickening. Gallbladder distension is moderate measuring less than 5 cm axial by 10 cm in craniocaudal extent. Mild fissural widening of hepatic fissures. Pancreas: Mild atrophy without signs of inflammation or ductal dilation. Spleen: Normal size and contour. No focal lesion or signs of acute process. Adrenals/Urinary Tract: Adrenal glands are unremarkable. Symmetric renal enhancement. No sign of hydronephrosis. No suspicious renal lesion or perinephric stranding. Urinary bladder is grossly unremarkable. Stomach/Bowel: Moderate size hiatal hernia similar to prior imaging. No signs of bowel obstruction or perienteric stranding. Colonic diverticulosis. Stool in the colon mainly in the proximal and mid colon without substantial distension. The appendix is not visible and there are no secondary signs in the  area of the cecum to suggest acute appendiceal process. Vascular/Lymphatic: Aortic atherosclerosis. No sign of aneurysm. Smooth contour of the IVC. There is no gastrohepatic or hepatoduodenal ligament lymphadenopathy. No retroperitoneal or mesenteric lymphadenopathy. No pelvic sidewall lymphadenopathy. Atherosclerotic changes are mild. Reproductive: Post hysterectomy Other: No ascites.  No pneumoperitoneum. Musculoskeletal: Signs of spinal fusion from L4 through S1. L3 compression fracture, all findings similar to imaging from April of 2014. Osteopenia. Review of the MIP images confirms the above findings. IMPRESSION: 1. Negative for pulmonary embolism. 2. Cholelithiasis with moderate gallbladder distension but without adjacent inflammatory changes. Correlate with any RIGHT upper quadrant symptoms. 3. Signs of potential pulmonary arterial hypertension. 4. Areas of mosaic attenuation in the chest that normalize at the lung bases on the abdominal portion of the study favors diffuse air trapping and atelectasis. Findings could reflect sequela of small airways disease. Also with potential bronchomalacia. 5. Moderate size hiatal hernia, likely mixed type, paraesophageal and sliding type hernia without surrounding stranding. Unchanged from previous imaging. 6. Colonic diverticulosis without acute inflammation. Aortic Atherosclerosis (ICD10-I70.0). Electronically Signed   By: Zetta Bills M.D.   On: 09/24/2021 12:41   CT ABDOMEN PELVIS W CONTRAST  Result Date: 09/24/2021 CLINICAL DATA:  Suspected bowel obstruction in an 87 year old female presenting with chest discomfort in epigastric pain, history of hiatal hernia. EXAM: CT ANGIOGRAPHY CHEST CT ABDOMEN AND PELVIS WITH CONTRAST TECHNIQUE: Multidetector CT imaging of the chest was performed using the standard protocol during bolus administration of intravenous contrast. Multiplanar CT image reconstructions and MIPs were obtained to evaluate the vascular anatomy.  Multidetector CT imaging of the abdomen and pelvis was performed using the standard protocol during bolus administration of intravenous contrast. RADIATION DOSE REDUCTION: This exam was performed according to the departmental dose-optimization program which includes automated exposure control, adjustment of the mA and/or kV according to patient size and/or use of iterative reconstruction technique. CONTRAST:  130m OMNIPAQUE IOHEXOL 350 MG/ML SOLN COMPARISON:  CT of the chest from February of 2020. No recent abdominal imaging is available for comparison. FINDINGS: CTA CHEST FINDINGS Cardiovascular: The aorta shows calcified atherosclerotic plaque, not well evaluated given that the study is optimized for assessment of pulmonary vasculature. 4 cm ascending thoracic aortic caliber. No stranding adjacent to the aorta. Tortuous great vessel origins in the chest. Mild cardiac enlargement. Enlargement of LEFT atrium and RIGHT heart. 3.5 cm central pulmonary artery caliber. Density of main pulmonary artery is 547 Hounsfield units. Study is adequate for assessment of the pulmonary arterial bed and is negative for pulmonary embolism Mediastinum/Nodes: No thoracic inlet, axillary, mediastinal or hilar adenopathy. Esophagus grossly normal. Moderate size hiatal hernia is similar to imaging from  February of 2020 Lungs/Pleura: Patchy ground-glass attenuation and mild septal thickening. Basilar atelectasis. No consolidation. No pleural effusion. No pneumothorax. Airways mildly slit like with respect to mainstem bronchi. Note that on the abdominal portion of the study the lung bases appear normal and well expanded. Musculoskeletal: Visualized clavicles and scapulae are intact. Sternum is intact. No displaced rib fractures. Costochondral elements are intact. Spinal degenerative changes without acute or destructive sternal deformity also similar to prior imaging indicative of remote trauma to the sternum and spine. Review of the MIP  images confirms the above findings. CT ABDOMEN and PELVIS FINDINGS Hepatobiliary: No focal, suspicious hepatic lesion. No pericholecystic stranding. No biliary duct dilation. Portal vein is patent. Cholelithiasis with distension of the gallbladder but without pericholecystic stranding or wall thickening. Gallbladder distension is moderate measuring less than 5 cm axial by 10 cm in craniocaudal extent. Mild fissural widening of hepatic fissures. Pancreas: Mild atrophy without signs of inflammation or ductal dilation. Spleen: Normal size and contour. No focal lesion or signs of acute process. Adrenals/Urinary Tract: Adrenal glands are unremarkable. Symmetric renal enhancement. No sign of hydronephrosis. No suspicious renal lesion or perinephric stranding. Urinary bladder is grossly unremarkable. Stomach/Bowel: Moderate size hiatal hernia similar to prior imaging. No signs of bowel obstruction or perienteric stranding. Colonic diverticulosis. Stool in the colon mainly in the proximal and mid colon without substantial distension. The appendix is not visible and there are no secondary signs in the area of the cecum to suggest acute appendiceal process. Vascular/Lymphatic: Aortic atherosclerosis. No sign of aneurysm. Smooth contour of the IVC. There is no gastrohepatic or hepatoduodenal ligament lymphadenopathy. No retroperitoneal or mesenteric lymphadenopathy. No pelvic sidewall lymphadenopathy. Atherosclerotic changes are mild. Reproductive: Post hysterectomy Other: No ascites.  No pneumoperitoneum. Musculoskeletal: Signs of spinal fusion from L4 through S1. L3 compression fracture, all findings similar to imaging from April of 2014. Osteopenia. Review of the MIP images confirms the above findings. IMPRESSION: 1. Negative for pulmonary embolism. 2. Cholelithiasis with moderate gallbladder distension but without adjacent inflammatory changes. Correlate with any RIGHT upper quadrant symptoms. 3. Signs of potential  pulmonary arterial hypertension. 4. Areas of mosaic attenuation in the chest that normalize at the lung bases on the abdominal portion of the study favors diffuse air trapping and atelectasis. Findings could reflect sequela of small airways disease. Also with potential bronchomalacia. 5. Moderate size hiatal hernia, likely mixed type, paraesophageal and sliding type hernia without surrounding stranding. Unchanged from previous imaging. 6. Colonic diverticulosis without acute inflammation. Aortic Atherosclerosis (ICD10-I70.0). Electronically Signed   By: Zetta Bills M.D.   On: 09/24/2021 12:41   DG Chest Portable 1 View  Result Date: 09/24/2021 CLINICAL DATA:  Midline chest pain for 2 days with shortness of breath with exertion for 6 months. EXAM: PORTABLE CHEST 1 VIEW COMPARISON:  Radiographs 06/06/2020 and 01/23/2019.  CT 03/13/2018. FINDINGS: 0923 hours. The heart size and mediastinal contours are stable. Stable mild left basilar scarring. The lungs are otherwise clear. No pleural effusion or pneumothorax. No acute osseous findings are evident. There are mild degenerative changes in the thoracic spine associated with a mild scoliosis. IMPRESSION: Stable chest.  No evidence of active cardiopulmonary process. Electronically Signed   By: Richardean Sale M.D.   On: 09/24/2021 09:33    Procedures Procedures    Medications Ordered in ED Medications  iohexol (OMNIPAQUE) 350 MG/ML injection 100 mL (100 mLs Intravenous Contrast Given 09/24/21 1218)    ED Course/ Medical Decision Making/ A&P  Medical Decision Making Amount and/or Complexity of Data Reviewed Labs: ordered. Radiology: ordered.  Risk Prescription drug management.    SKARLET LYONS is a 87 y.o. female with a past medical history significant for previous stroke, diverticulosis, irritable bowel syndrome, previous Schatzki ring status post esophageal dilation, hiatal hernia, chronic heart failure with  preserved ejection fraction, previous gastritis, and previous hysterectomy who presents with 2 days of severe pleuritic chest discomfort, upper epigastric discomfort, nausea, vomiting, and shortness of breath.  According to patient, she has had some cough with some phlegm since yesterday but is also having severe pain in her central chest that also goes to the upper part of her abdomen in the epigastric area.  She reports it is different than some of the reflux type burning pain she has had in the past but does feel somewhat similar.  She reports she does drink caffeinated beverages and tea and is unsure if she exacerbated her reflux.  She denies any new trauma.  Denies any constipation or diarrhea but does report-year-old bowels and it fluctuates frequently.  Denies any urinary changes.  Reports the pain does not go straight to her back but she feels there is a knot in her central chest and epigastric area.  She reports she had vomiting this morning with nausea and has not eaten much since yesterday.  She reports the pain is moderate to severe but does not want pain medicine initially until we figure out what is going on.  She reports she cannot take a deep breath and feels short of breath.  Denies any history of blood clots to her knowledge.  On exam, lungs were clear and chest was nontender.  No murmur.  Abdomen was nontender on my exam I did appreciate some bowel sounds.  Flanks and back nontender.  Good pulses in extremities.  Patient otherwise well-appearing  EKG did not show STEMI.  Clinically I suspect the patient has an exacerbation of her previous reflux however with the mild discomfort she is having and her other symptoms I do feel that we need to rule out pulmonary embolism or even a bowel obstruction or diaphragmatic/hiatal hernia with obstruction.  We will get CT PE study and CT of the abdomen pelvis.  We will get screening labs as well.  We will get chest x-ray first although her lungs do not  sound like a pneumothorax at this time. With reported cough with phlegm we will get a COVID test.  Patient did not want medicine initially and we will wait on results.  Anticipate reassessment after work-up to determine disposition.  Work-up returned overall reassuring.  CT chest showed no PE and shows a similar hiatal hernia to prior.  There are gallstones but the abdomen pelvis CT did not show acute cholecystitis.  Did show some distention so we will get a right upper quad ultrasound to rule out acute cholecystitis or symptomatic cholelithiasis.  Labs overall otherwise returned reassuring.  On my reassessment she is feeling much better and would like to go home.  Ultrasound did not show convincing evidence of acute cholecystitis but I do suspect some degree of either some of the cholelithiasis versus hiatal hernia discomfort versus reflux.  Patient says she has a GI doctor she will call and will also follow with general surgery for her gallstones.  She will continue to take her reflux medications but try to modify her diet to decrease acidic and spicy things.  She understands return precautions and follow-up instructions and was  discharged in good condition with improved symptoms.         Final Clinical Impression(s) / ED Diagnoses Final diagnoses:  Atypical chest pain  Epigastric pain  Gallstones  Hiatal hernia    Rx / DC Orders ED Discharge Orders     None      Clinical Impression: 1. Atypical chest pain   2. Epigastric pain   3. Gallstones   4. Hiatal hernia     Disposition: Discharge  Condition: Good  I have discussed the results, Dx and Tx plan with the pt(& family if present). He/she/they expressed understanding and agree(s) with the plan. Discharge instructions discussed at great length. Strict return precautions discussed and pt &/or family have verbalized understanding of the instructions. No further questions at time of discharge.    New Prescriptions   No  medications on file    Follow Up: Tower, Wynelle Fanny, MD [place] [address] [phone]     your GI team   For the hiatal hernia and reflux troubles  Surgery, [facility] [place] [address] [phone]   For your gallstones and gallbladder     Jessenia Filippone, Gwenyth Allegra, MD 09/24/21 [phone]

## 2021-09-24 NOTE — ED Notes (Signed)
Discharge instructions reviewed with patient. Patient verbalized understanding of instructions. Follow-up care and medications were reviewed. Patient ambulatory with steady gait. VSS upon discharge.  ?

## 2021-09-24 NOTE — Discharge Instructions (Signed)
Your history, exam, work-up today was overall reassuring.  We did see evidence of gallstones but no convincing evidence of acute cholecystitis.  He did have the hiatal hernia appeared similar to prior but this may be related to some of your reflux and burning chest discomfort.  The rest of the work-up was overall reassuring as we discussed.  Given your improvement in symptoms and well appearance we do feel you are safe for discharge home however we do want you to call your GI team and the general surgery team for follow-up.  If any symptoms change or worsen acutely, please return to the nearest emergency department.

## 2021-09-29 ENCOUNTER — Ambulatory Visit (INDEPENDENT_AMBULATORY_CARE_PROVIDER_SITE_OTHER): Payer: Medicare Other | Admitting: Nurse Practitioner

## 2021-09-29 ENCOUNTER — Encounter: Payer: Self-pay | Admitting: Nurse Practitioner

## 2021-09-29 ENCOUNTER — Telehealth: Payer: Self-pay

## 2021-09-29 VITALS — BP 126/66 | HR 67 | Temp 96.6°F | Resp 16 | Ht 66.75 in | Wt 200.0 lb

## 2021-09-29 DIAGNOSIS — R195 Other fecal abnormalities: Secondary | ICD-10-CM

## 2021-09-29 LAB — HEMOCCULT GUIAC POC 1CARD (OFFICE): Fecal Occult Blood, POC: NEGATIVE

## 2021-09-29 LAB — CBC
HCT: 39.2 % (ref 36.0–46.0)
Hemoglobin: 12.8 g/dL (ref 12.0–15.0)
MCHC: 32.7 g/dL (ref 30.0–36.0)
MCV: 85.7 fl (ref 78.0–100.0)
Platelets: 255 10*3/uL (ref 150.0–400.0)
RBC: 4.57 Mil/uL (ref 3.87–5.11)
RDW: 14.3 % (ref 11.5–15.5)
WBC: 5.6 10*3/uL (ref 4.0–10.5)

## 2021-09-29 NOTE — Patient Instructions (Signed)
Nice to see you today I will be in touch with the labs once I have the result I want you to follow up with Dr. Glori Bickers to go over your recent emergency room visit, sooner if you need me  You stool card was negative in office.

## 2021-09-29 NOTE — Assessment & Plan Note (Signed)
Patient is on clopidogrel but no anticoagulation.  Not on any iron or super recent Pepto-Bismol use.  Does not use NSAIDs very frequently per her report.  Hemoccult negative in office.  We will check CBC if declining in counts from previous CBC last week will have patient use I fob if normal we will not do I fob.  Patient will follow-up in approximate 1 week with primary care Dr. Glori Bickers to discuss recent emergency room visit.  Patient already has appointment scheduled with GI from the emergency department.

## 2021-09-29 NOTE — Progress Notes (Signed)
Acute Office Visit  Subjective:     Patient ID: [name] [name], female    DOB: 12-11-1934, 87 y.o.   MRN: [id number]  Chief Complaint  Patient presents with   dark stools    Noticed dark/tarry very loose bowel since 09/28/21. No red blood.     HPI Patient is in today for Dark stools  First noticed on 09/28/2021 with a dark stool x2. States that this morning she had an additional dark stool it was described as loose and some formed.  States that she does experience diarrhea approximately 2-3 times a week. Not regular NSAID use.  States she has not been using iron or eating lots of leafy greens over the past few days. States middle of last week she did Pepto bismal but nothing since.  Was recently evaluated in the emergency department.  A CT angio chest and abdomen along with a right upper quadrant ultrasound.  Did review these.  Patient was concerned as it did show her hiatal hernia which is being characterized in the past but does not seem to have changed per imaging.  Review of Systems  Constitutional:  Positive for malaise/fatigue. Negative for chills and fever.  Gastrointestinal:  Positive for diarrhea. Negative for abdominal pain, nausea and vomiting.       Black stool  Genitourinary:  Negative for dysuria and hematuria.        Objective:    BP 126/66   Pulse 67   Temp (!) 96.6 F (35.9 C)   Resp 16   Ht 5' 6.75" (1.695 m)   Wt 200 lb (90.7 kg)   SpO2 97%   BMI 31.56 kg/m    Physical Exam Vitals and nursing note reviewed. Exam conducted with a chaperone present [facility], RMA).  Constitutional:      Appearance: Normal appearance.  Cardiovascular:     Rate and Rhythm: Normal rate and regular rhythm.     Heart sounds: Normal heart sounds.  Pulmonary:     Effort: Pulmonary effort is normal.     Breath sounds: Normal breath sounds.  Abdominal:     General: Bowel sounds are normal. There is no distension.     Palpations: There is no mass.      Tenderness: There is no abdominal tenderness.     Hernia: No hernia is present.  Genitourinary:    Rectum: Normal. Guaiac result negative. No mass, tenderness, anal fissure, external hemorrhoid or internal hemorrhoid. Normal anal tone.  Neurological:     Mental Status: She is alert.     No results found for any visits on 09/29/21.      Assessment & Plan:   Problem List Items Addressed This Visit       Other   Dark stools - Primary    Patient is on clopidogrel but no anticoagulation.  Not on any iron or super recent Pepto-Bismol use.  Does not use NSAIDs very frequently per her report.  Hemoccult negative in office.  We will check CBC if declining in counts from previous CBC last week will have patient use I fob if normal we will not do I fob.  Patient will follow-up in approximate 1 week with primary care Dr. Glori Bickers to discuss recent emergency room visit.  Patient already has appointment scheduled with GI from the emergency department.      Relevant Orders   CBC   Hemoccult - 1 Card (office)   Fecal occult blood, imunochemical    No orders  of the defined types were placed in this encounter.   Return in about 1 week (around 10/06/2021) for With DR tower for ed follow up .  Romilda Garret, NP

## 2021-09-29 NOTE — Chronic Care Management (AMB) (Signed)
    Chronic Care Management Pharmacy Assistant   Name: [name] [name]  MRN: [id number] DOB: 03/30/1934   Reason for Encounter: Hospital Follow up non CCM   Medications: Outpatient Encounter Medications as of 09/29/2021  Medication Sig   acetaminophen (TYLENOL) 325 MG tablet Take 2 tablets (650 mg total) by mouth every 6 (six) hours as needed for mild pain (or Fever >/= 101).   APPLE CIDER VINEGAR PO Take 2 tablets by mouth daily.   atorvastatin (LIPITOR) 10 MG tablet TAKE 1 TABLET DAILY   calcium carbonate (TUMS - DOSED IN MG ELEMENTAL CALCIUM) 500 MG chewable tablet Chew 1 tablet (200 mg of elemental calcium total) by mouth 4 (four) times daily as needed for indigestion or heartburn.   CALCIUM-MAGNESIUM-ZINC PO Take 1 capsule by mouth daily.   clopidogrel (PLAVIX) 75 MG tablet TAKE 1 TABLET DAILY   diclofenac sodium (VOLTAREN) 1 % GEL Apply 1 application topically 2 (two) times daily.   esomeprazole (NEXIUM) 40 MG capsule TAKE 1 CAPSULE TWICE A DAY BEFORE MEALS   estradiol (ESTRACE VAGINAL) 0.1 MG/GM vaginal cream Use a pea sized amount of cream vaginally twice per week   famotidine (PEPCID) 40 MG tablet Take 1 tablet (40 mg total) by mouth daily.   furosemide (LASIX) 20 MG tablet TAKE 1 TABLET DAILY   gabapentin (NEURONTIN) 100 MG capsule TAKE 2 CAPSULES THREE TIMES A DAY (Patient taking differently: 2 in the am, 2 in the pm, and 1 at bedtime)   HYDROcodone-acetaminophen (NORCO) 10-325 MG tablet Take 1 tablet by mouth every 6 (six) hours as needed.  (Patient not taking: Reported on 09/29/2021)   metoprolol succinate (TOPROL-XL) 25 MG 24 hr tablet TAKE ONE-HALF (1/2) TABLET AT BEDTIME   Multiple Vitamins-Minerals (PRESERVISION AREDS PO) Take 1 capsule by mouth 2 (two) times daily.   nitroGLYCERIN (NITROSTAT) 0.4 MG SL tablet Place 1 tablet (0.4 mg total) under the tongue every 5 (five) minutes as needed for chest pain.   Polyethyl Glycol-Propyl Glycol (SYSTANE OP) Apply 1-2 drops to eye  3 (three) times daily as needed (dry eyes.).    sertraline (ZOLOFT) 100 MG tablet TAKE 2 TABLETS DAILY   No facility-administered encounter medications on file as of 09/29/2021.     Reviewed hospital notes for details of recent visit. Patient has been contacted by Transitions of Care team: No  Admitted to the ED on 09/24/21. Discharge date was 09/24/21.  Discharged from [facility].   Discharge diagnosis (Principal Problem): Atypical chest pain  Patient was discharged to Home  Brief summary of hospital course: Clinically I suspect the patient has an exacerbation of her previous reflux however with the mild discomfort she is having and her other symptoms I do feel that we need to rule out pulmonary embolism or even a bowel obstruction or diaphragmatic/hiatal hernia with obstruction.Did show some distention so we will get a right upper quad ultrasound to rule out acute cholecystitis or symptomatic cholelithiasis.Labs overall otherwise returned reassuring.follow up with GI and discharged to home   Medications that remain the same after Hospital Discharge:??  -All other medications will remain the same.    Next CCM appt: none  Other upcoming appts: PCP appointment on 09/29/21  family medicine  (Matt Cable,NP)  Charlene Brooke, PharmD notified and will determine if action is needed.   Avel Sensor, [place]  [phone]

## 2021-10-12 ENCOUNTER — Other Ambulatory Visit: Payer: Self-pay | Admitting: Cardiovascular Disease

## 2021-10-13 ENCOUNTER — Ambulatory Visit: Payer: Medicare Other | Admitting: Physical Therapy

## 2021-10-20 ENCOUNTER — Ambulatory Visit: Payer: Medicare Other | Admitting: Physical Therapy

## 2021-10-26 ENCOUNTER — Ambulatory Visit (INDEPENDENT_AMBULATORY_CARE_PROVIDER_SITE_OTHER): Payer: Medicare Other | Admitting: Physician Assistant

## 2021-10-26 ENCOUNTER — Encounter: Payer: Self-pay | Admitting: Physician Assistant

## 2021-10-26 ENCOUNTER — Telehealth: Payer: Self-pay

## 2021-10-26 VITALS — BP 124/70 | HR 83 | Ht 66.75 in | Wt 200.0 lb

## 2021-10-26 DIAGNOSIS — K219 Gastro-esophageal reflux disease without esophagitis: Secondary | ICD-10-CM | POA: Diagnosis not present

## 2021-10-26 DIAGNOSIS — R1319 Other dysphagia: Secondary | ICD-10-CM | POA: Diagnosis not present

## 2021-10-26 DIAGNOSIS — Z7901 Long term (current) use of anticoagulants: Secondary | ICD-10-CM | POA: Diagnosis not present

## 2021-10-26 DIAGNOSIS — I2510 Atherosclerotic heart disease of native coronary artery without angina pectoris: Secondary | ICD-10-CM

## 2021-10-26 MED ORDER — PANTOPRAZOLE SODIUM 40 MG PO TBEC: 40.0000 mg | DELAYED_RELEASE_TABLET | Freq: Two times a day (BID) | ORAL | 5 refills | Status: DC

## 2021-10-26 MED ORDER — FAMOTIDINE 40 MG PO TABS: 40.0000 mg | ORAL_TABLET | Freq: Two times a day (BID) | ORAL | 5 refills | Status: DC

## 2021-10-26 NOTE — Progress Notes (Signed)
Chief Complaint: Follow up ER visit for worsening reflux  HPI:   [name] [name] is an 87 year old Caucasian female, known to Dr. Ardis Hughs, with a past medical history as listed below including CHF (03/05/2021 echo with LVEF 60-65%), who presents to clinic today after being seen in the ER for worsening reflux symptoms.    04/02/2014 EGD with a 4 cm hiatal hernia causing typical result in esophageal tortuosity, especially distally and a Schatzki's ring at the GE junction which was dilated.    09/24/2021 patient seen in the ED for atypical chest pain.  Labs with a glucose of 114 and otherwise normal CMP, CBC, lactic acid, lipase, troponins and negative COVID test.  Right upper quadrant ultrasound with gallbladder stones but no sign of acute cholecystitis.  CTAP at that time with cholelithiasis and moderate gallbladder distention but without adjacent inflammatory changes, areas of mosaic attenuation in the chest that normalized at the lung bases on the abdominal portion of the study which favors diffuse air trapping and atelectasis, moderate-sized hiatal hernia likely mixed type, paraesophageal and sliding without surrounding stranding, unchanged.  At that time diagnosed with an exacerbation of her previous reflux.    09/29/2021 patient seen by PCP for some dark stools.  CBC at the time with normal hemoglobin the same as a week prior at 12.8.  Hemoccult cards negative.    Today, the patient tells me that over the past year she has had increasing difficulty swallowing and feels like there is constantly something stuck in her throat and when she eats she has to take very small bites and chew very well and even then feels like things are not going down, she takes lots of sips of water and feels like this does help to push things down slowly, she is avoiding some foods entirely due to symptoms.  Also she eats and then bends over her food just comes right back up.  Also believes that her Nexium is not working as well as  it once did do for her reflux symptoms if she is variances this around eating as well.  Currently taking Nexium 40 mg twice a day and Famotidine 40 mg in the morning.    Patient ambulates with walker but tells me she can walk short distances by herself, she is supposed to be on oxygen at night but she chooses not to wear this.  She has 2 grandchildren getting married next year.    Denies fever, chills, abdominal pain, weight loss or blood in her stool.  Past Medical History:  Diagnosis Date   Alternating constipation and diarrhea    Anxiety state, unspecified    Complication of anesthesia    slow to wake up with surgery several years ago    COVID-19 virus infection 12/2018   Depressive disorder, not elsewhere classified    Diaphragmatic hernia without mention of obstruction or gangrene    Difficulty sleeping    Diverticulosis of colon (without mention of hemorrhage)    Esophageal reflux    External hemorrhoids without mention of complication    Fatigue    Frequency of urination    Helicobacter pylori (H. pylori)    Hiatal hernia    History of nuclear stress test    Myoview 05/2021: EF 70, normal perfusion; low risk   History of transfusion    Lumbar spondylosis    Macular degeneration (senile) of retina, unspecified    Osteoarthrosis, unspecified whether generalized or localized, unspecified site 08/11/2011   hx. "rhematoid arthritis",  osteoarthritis, DDD, bursitis(hip)   Osteoporosis    Palpitations    PONV (postoperative nausea and vomiting)    Schatzki's ring    Shortness of breath 08/11/2011   with exertion only at present   Unspecified cerebral artery occlusion with cerebral infarction 08/11/2011   '97/ '06( TIA)-affected lt. side, slight weakness L side   Unspecified vitamin D deficiency     Past Surgical History:  Procedure Laterality Date   ABDOMINAL HYSTERECTOMY  08-11-11   APPENDECTOMY     BACK SURGERY  08-11-11   '11-hx. lumbar fusion with retained hardware    CATARACT EXTRACTION  08-11-11   Bilateral   ESOPHAGEAL DILATION     KNEE SURGERY  08-11-11   rt. knee scope   PATELLAR TENDON REPAIR Left 02/22/2014   Procedure: PATELLA TENDON REPAIR;  Surgeon: Gearlean Alf, MD;  Location: [facility];  Service: Orthopedics;  Laterality: Left;   PATELLAR TENDON REPAIR Left 06/25/2014   Procedure: LEFT PATELLA TENDON REPAIR;  Surgeon: Gaynelle Arabian, MD;  Location: [facility];  Service: Orthopedics;  Laterality: Left;   TOTAL KNEE ARTHROPLASTY  08/16/2011   Procedure: TOTAL KNEE ARTHROPLASTY;  Surgeon: Gearlean Alf, MD;  Location: [facility];  Service: Orthopedics;  Laterality: Right;   TOTAL KNEE ARTHROPLASTY Left 01/27/2014   Procedure: LEFT TOTAL KNEE ARTHROPLASTY;  Surgeon: Gearlean Alf, MD;  Location: [facility];  Service: Orthopedics;  Laterality: Left;   TUBAL LIGATION      Current Outpatient Medications  Medication Sig Dispense Refill   acetaminophen (TYLENOL) 325 MG tablet Take 2 tablets (650 mg total) by mouth every 6 (six) hours as needed for mild pain (or Fever >/= 101). 40 tablet 0   APPLE CIDER VINEGAR PO Take 2 tablets by mouth daily.     atorvastatin (LIPITOR) 10 MG tablet TAKE 1 TABLET DAILY 90 tablet 1   calcium carbonate (TUMS - DOSED IN MG ELEMENTAL CALCIUM) 500 MG chewable tablet Chew 1 tablet (200 mg of elemental calcium total) by mouth 4 (four) times daily as needed for indigestion or heartburn. 10 tablet 0   CALCIUM-MAGNESIUM-ZINC PO Take 1 capsule by mouth daily.     clopidogrel (PLAVIX) 75 MG tablet TAKE 1 TABLET DAILY 90 tablet 3   diclofenac sodium (VOLTAREN) 1 % GEL Apply 1 application topically 2 (two) times daily.     esomeprazole (NEXIUM) 40 MG capsule TAKE 1 CAPSULE TWICE A DAY BEFORE MEALS 180 capsule 2   estradiol (ESTRACE VAGINAL) 0.1 MG/GM vaginal cream Use a pea sized amount of cream vaginally twice per week 42.5 g 0   famotidine (PEPCID) 40 MG tablet Take 1 tablet (40 mg total) by mouth daily. 90 tablet 3   furosemide (LASIX) 20 MG  tablet TAKE 1 TABLET DAILY 90 tablet 2   gabapentin (NEURONTIN) 100 MG capsule TAKE 2 CAPSULES THREE TIMES A DAY (Patient taking differently: 2 in the am, 2 in the pm, and 1 at bedtime) 540 capsule 1   HYDROcodone-acetaminophen (NORCO) 10-325 MG tablet Take 1 tablet by mouth every 6 (six) hours as needed.  0   metoprolol succinate (TOPROL-XL) 25 MG 24 hr tablet TAKE ONE-HALF (1/2) TABLET AT BEDTIME 45 tablet 2   Multiple Vitamins-Minerals (PRESERVISION AREDS PO) Take 1 capsule by mouth 2 (two) times daily.     Polyethyl Glycol-Propyl Glycol (SYSTANE OP) Apply 1-2 drops to eye 3 (three) times daily as needed (dry eyes.).      sertraline (ZOLOFT) 100 MG tablet TAKE 2 TABLETS  DAILY 180 tablet 3   nitroGLYCERIN (NITROSTAT) 0.4 MG SL tablet Place 1 tablet (0.4 mg total) under the tongue every 5 (five) minutes as needed for chest pain. 25 tablet 3   No current facility-administered medications for this visit.    Allergies as of 10/26/2021 - Review Complete 09/24/2021  Allergen Reaction Noted   Alendronate sodium  03/14/2006   Alendronate sodium Other (See Comments) 03/14/2006   Celecoxib Other (See Comments) 03/14/2006   Omeprazole Nausea Only 09/03/2015   Protonix [pantoprazole sodium] Nausea And Vomiting 09/03/2015   Rabeprazole Nausea And Vomiting 09/03/2015   Latex Itching and Rash 12/22/2008    Family History  Problem Relation Age of Onset   Heart disease Father    Coronary artery disease Brother    Diabetes Brother    Heart disease Brother    Pancreatic cancer Daughter    Diabetes Sister    Colon cancer Neg Hx    Colon polyps Neg Hx    Esophageal cancer Neg Hx    Gallbladder disease Neg Hx     Social History   Socioeconomic History   Marital status: Widowed    Spouse name: Not on file   Number of children: 3   Years of education: Not on file   Highest education level: Not on file  Occupational History   Occupation: disabled    Employer: RETIRED  Tobacco Use   Smoking  status: Never   Smokeless tobacco: Never  Vaping Use   Vaping Use: Never used  Substance and Sexual Activity   Alcohol use: No    Alcohol/week: 0.0 standard drinks of alcohol   Drug use: No   Sexual activity: Never  Other Topics Concern   Not on file  Social History Narrative   Not on file   Social Determinants of Health   Financial Resource Strain: Not on file  Food Insecurity: Not on file  Transportation Needs: Not on file  Physical Activity: Not on file  Stress: Not on file  Social Connections: Not on file  Intimate Partner Violence: Not on file    Review of Systems:    Constitutional: No weight loss, fever or chills Skin: No rash or itching Cardiovascular: No chest pain Respiratory: No SOB  Gastrointestinal: See HPI and otherwise negative   Physical Exam:  Vital signs: BP 124/70   Pulse 83   Ht 5' 6.75" (1.695 m)   Wt 200 lb (90.7 kg)   SpO2 98%   BMI 31.56 kg/m    Constitutional:   Pleasant Elderly Caucasian female appears to be in NAD, Well developed, Well nourished, alert and cooperative Respiratory: Respirations even and unlabored. Lungs clear to auscultation bilaterally.   No wheezes, crackles, or rhonchi.  Cardiovascular: Normal S1, S2. No MRG. Regular rate and rhythm. No peripheral edema, cyanosis or pallor.  Gastrointestinal:  Soft, nondistended, nontender. No rebound or guarding. Normal bowel sounds. No appreciable masses or hepatomegaly. Rectal:  Not performed.  Msk:  Symmetrical without gross deformities. Without edema, no deformity or joint abnormality. +ambulates with walker Psychiatric: Oriented to person, place and time. Demonstrates good judgement and reason without abnormal affect or behaviors.  RELEVANT LABS AND IMAGING: CBC    Component Value Date/Time   WBC 5.6 09/29/2021 1301   RBC 4.57 09/29/2021 1301   HGB 12.8 09/29/2021 1301   HGB 13.2 02/16/2021 1550   HCT 39.2 09/29/2021 1301   HCT 40.8 02/16/2021 1550   PLT 255.0 09/29/2021  1301   PLT 275 02/16/2021  1550   MCV 85.7 09/29/2021 1301   MCV 90 02/16/2021 1550   MCH 28.5 09/24/2021 0945   MCHC 32.7 09/29/2021 1301   RDW 14.3 09/29/2021 1301   RDW 12.7 02/16/2021 1550   LYMPHSABS 1.4 09/24/2021 0945   MONOABS 0.6 09/24/2021 0945   EOSABS 0.3 09/24/2021 0945   BASOSABS 0.1 09/24/2021 0945    CMP     Component Value Date/Time   NA 141 09/24/2021 0945   NA 141 02/16/2021 1550   K 3.9 09/24/2021 0945   CL 105 09/24/2021 0945   CO2 26 09/24/2021 0945   GLUCOSE 114 (H) 09/24/2021 0945   BUN 11 09/24/2021 0945   BUN 20 02/16/2021 1550   CREATININE 0.93 09/24/2021 0945   CALCIUM 9.3 09/24/2021 0945   PROT 6.9 09/24/2021 0945   PROT 7.3 02/16/2021 1550   ALBUMIN 3.9 09/24/2021 0945   ALBUMIN 4.5 02/16/2021 1550   AST 21 09/24/2021 0945   ALT 15 09/24/2021 0945   ALKPHOS 81 09/24/2021 0945   BILITOT 0.7 09/24/2021 0945   BILITOT 0.2 02/16/2021 1550   GFRNONAA >60 09/24/2021 0945   GFRAA 79 07/03/2017 1102    Assessment: 1.  Dysphagia: With solid foods, tend to get stuck and sometimes even come back up on their own, previous history of Schatzki's ring dilated in 2016; likely Schatzki's ring 2.  GERD: Uncontrolled on Nexium 40 twice daily and Famotidine 40 every morning; likely ongoing gastritis 3.  Known hiatal hernia: Likely contributing to symptoms above  Plan: 1.  Due to the fact the patient is prescribed oxygen at night we will do her procedure at the hospital.  Discussed with Dr. Candis Schatz and he agreed to add her to his November schedule.  She was scheduled with him for EGD and dilation at the hospital.  Did provide the patient a detailed list of risks for the procedure and she agrees to proceed. 2.  In the interim we will stop Nexium and start Pantoprazole 40 mg twice daily, 30-60 minutes for breakfast and dinner.  #60 with 5 refills. 3.  Also increased Famotidine to 40 mg twice daily, every morning and nightly #60 with 5 refills. 4.  Reviewed  anti-dysphagia measures including taking small bites, drinking sips of water between bites, avoiding distraction while eating the chin tuck technique. 5. Patient advised to hold her Plavix for 5 days prior to time of procedure, we will communicate with her prescribing physician to ensure this is acceptable for her. 5.  Patient to follow in clinic per recommendations from Dr. Candis Schatz after time of procedure.  Note was sent to him today in lieu of Dr. Ardis Hughs absence.  [name] Newer, PA-C [facility] 10/26/2021, 9:00 AM  Cc: Tower, Wynelle Fanny, MD

## 2021-10-26 NOTE — Telephone Encounter (Signed)
[facility] Pre-operative Risk Assessment     Request for surgical clearance:     Endoscopy Procedure  What type of surgery is being performed?     EGD  When is this surgery scheduled?     12/06/21  What type of clearance is required ?   Pharmacy  Are there any medications that need to be held prior to surgery and how long? Plavix for 5 days  Practice name and name of physician performing surgery?  Dr. Candis Schatz,    [facility]  What is your office phone and fax number?      Phone- [phone]  [phone]  Anesthesia type (None, local, MAC, general) ?       MAC

## 2021-10-26 NOTE — Patient Instructions (Addendum)
    _______________________________________________________ We have sent the following medications to your pharmacy for you to pick up at your convenience: Pantoprazole, Famotidine  Stop Nexium. Take Pantoprazole 40 MG two times daily.  Take Famotidine to 40 MG every morning and at night.  You have been scheduled for an endoscopy. Please follow written instructions given to you at your visit today. If you use inhalers (even only as needed), please bring them with you on the day of your procedure.   You will be contaced by our office prior to your procedure for directions on holding your Plavix.  If you do not hear from our office 1 week prior to your scheduled procedure, please call [phone] to discuss.    If you are age 85 or older, your body mass index should be between 23-30. Your Body mass index is 31.56 kg/m. If this is out of the aforementioned range listed, please consider follow up with your Primary Care Provider.  ________________________________________________________  The [facility] providers would like to encourage you to use [facility] to communicate with providers for non-urgent requests or questions.  Due to long hold times on the telephone, sending your provider a message by [facility] may be a faster and more efficient way to get a response.  Please allow 48 business hours for a response.  Please remember that this is for non-urgent requests.  _______________________________________________________  Due to recent changes in healthcare laws, you may see the results of your imaging and laboratory studies on MyChart before your provider has had a chance to review them.  We understand that in some cases there may be results that are confusing or concerning to you. Not all laboratory results come back in the same time frame and the provider may be waiting for multiple results in order to interpret others.  Please give [place] 48 hours in order for your provider to thoroughly review all  the results before contacting the office for clarification of your results.     Thank you for choosing me and [facility].  Mort Sawyers, PA-C

## 2021-10-26 NOTE — Telephone Encounter (Signed)
Do you have any concerns with 5 day plavix hold for endoscopy?    Thanks --[facility]

## 2021-10-26 NOTE — Telephone Encounter (Signed)
From a cardiac standpoint, she can hold Plavix for 5 days. However, it looks like she also takes Plavix for a hx of TIA and stroke. She would need to get clearance to hold Plavix from neurology if followed by them. [name] Dopp, PA-C    10/26/2021 5:26 PM

## 2021-10-27 ENCOUNTER — Ambulatory Visit: Payer: Medicare Other | Admitting: Physical Therapy

## 2021-10-27 ENCOUNTER — Telehealth: Payer: Self-pay | Admitting: Physical Therapy

## 2021-10-27 DIAGNOSIS — D3132 Benign neoplasm of left choroid: Secondary | ICD-10-CM | POA: Diagnosis not present

## 2021-10-27 DIAGNOSIS — H524 Presbyopia: Secondary | ICD-10-CM | POA: Diagnosis not present

## 2021-10-27 DIAGNOSIS — H04123 Dry eye syndrome of bilateral lacrimal glands: Secondary | ICD-10-CM | POA: Diagnosis not present

## 2021-10-27 DIAGNOSIS — H43813 Vitreous degeneration, bilateral: Secondary | ICD-10-CM | POA: Diagnosis not present

## 2021-10-27 DIAGNOSIS — H353132 Nonexudative age-related macular degeneration, bilateral, intermediate dry stage: Secondary | ICD-10-CM | POA: Diagnosis not present

## 2021-10-27 NOTE — Telephone Encounter (Signed)
Pt notified of Dr. Marliss Coots comments. Pt said she hasn't had any neurologic issues at all and she just had her vision checked and it's the exact same also. Pt will hold plavix 5 days and restart when GI advises

## 2021-10-27 NOTE — Telephone Encounter (Signed)
I think it is ok to hold plavix for 5 days before procedure and re start per GI/surgeon.   She has had several surgeries and held it before. [place] please check in with her and make sure she has not new neurologic changes (stroke symptoms, etc) -follow up with me first if this is the case   Her vitals at last visit here were ok

## 2021-10-27 NOTE — Progress Notes (Signed)
Agree with the assessment and plan as outlined by Ellouise Newer, PA-C, however, I am also suspicious that her symptoms may be more due to progression of her hiatal hernia (reports of difficulty swallowing but also significant regurgitation may indicate food sitting in a hernia sac).  She had a barium swallow last year which showed a moderate sized hiatal hernia and did not demonstrate a stricture/ring/stenosis.  The barium tablet passed easily.  I would like to repeat a barium swallow to reassess the size of the hernia, and whether the barium tablet passes easily.  This can be done much more quickly than the EGD in the hospital.  If the hiatal hernia appears to be the source of her dysphagia, she may need to discuss surgery.  Paolo Okane E. Candis Schatz, MD

## 2021-10-27 NOTE — Therapy (Deleted)
OUTPATIENT PHYSICAL THERAPY FEMALE PELVIC TREATMENT   Patient Name: [name] [name] MRN: [id number] DOB:May 18, 1934, 87 y.o., female Today's Date: 10/27/2021     Past Medical History:  Diagnosis Date   Alternating constipation and diarrhea    Anxiety state, unspecified    Complication of anesthesia    slow to wake up with surgery several years ago    COVID-19 virus infection 12/2018   Depressive disorder, not elsewhere classified    Diaphragmatic hernia without mention of obstruction or gangrene    Difficulty sleeping    Diverticulosis of colon (without mention of hemorrhage)    Esophageal reflux    External hemorrhoids without mention of complication    Fatigue    Frequency of urination    Helicobacter pylori (H. pylori)    Hiatal hernia    History of nuclear stress test    Myoview 05/2021: EF 70, normal perfusion; low risk   History of transfusion    Lumbar spondylosis    Macular degeneration (senile) of retina, unspecified    Osteoarthrosis, unspecified whether generalized or localized, unspecified site 08/11/2011   hx. "rhematoid arthritis", osteoarthritis, DDD, bursitis(hip)   Osteoporosis    Palpitations    PONV (postoperative nausea and vomiting)    Schatzki's ring    Shortness of breath 08/11/2011   with exertion only at present   Unspecified cerebral artery occlusion with cerebral infarction 08/11/2011   '97/ '06( TIA)-affected lt. side, slight weakness L side   Unspecified vitamin D deficiency    Past Surgical History:  Procedure Laterality Date   ABDOMINAL HYSTERECTOMY  08-11-11   APPENDECTOMY     BACK SURGERY  08-11-11   '11-hx. lumbar fusion with retained hardware   CATARACT EXTRACTION  08-11-11   Bilateral   ESOPHAGEAL DILATION     KNEE SURGERY  08-11-11   rt. knee scope   PATELLAR TENDON REPAIR Left 02/22/2014   Procedure: PATELLA TENDON REPAIR;  Surgeon: Gearlean Alf, MD;  Location: [facility];  Service: Orthopedics;  Laterality: Left;   PATELLAR  TENDON REPAIR Left 06/25/2014   Procedure: LEFT PATELLA TENDON REPAIR;  Surgeon: Gaynelle Arabian, MD;  Location: [facility];  Service: Orthopedics;  Laterality: Left;   TOTAL KNEE ARTHROPLASTY  08/16/2011   Procedure: TOTAL KNEE ARTHROPLASTY;  Surgeon: Gearlean Alf, MD;  Location: [facility];  Service: Orthopedics;  Laterality: Right;   TOTAL KNEE ARTHROPLASTY Left 01/27/2014   Procedure: LEFT TOTAL KNEE ARTHROPLASTY;  Surgeon: Gearlean Alf, MD;  Location: [facility];  Service: Orthopedics;  Laterality: Left;   TUBAL LIGATION     Patient Active Problem List   Diagnosis Date Noted   Dark stools 09/29/2021   Current use of proton pump inhibitor 05/12/2021   Palpitation 05/12/2021   Precordial chest pain 05/07/2021   Aortic atherosclerosis ([place]) 05/07/2021   Coronary artery calcification seen on CT scan 05/07/2021   Acute cystitis with hematuria 03/02/2021   Myalgia 12/09/2019   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 12/09/2019   Chronic tension-type headache, not intractable 09/06/2019   Ageusia 09/06/2019   Anosmia 09/06/2019   Trigeminal neuralgia 03/15/2019   Mobility impaired 12/11/2018   Poor balance 08/13/2018   Memory loss 06/27/2018   Physical deconditioning 03/19/2018   Chronic heart failure with preserved ejection fraction ([place]) 02/09/2018   Prediabetes 01/26/2017   Hearing loss 01/19/2016   Carpal tunnel syndrome 08/31/2015   Encounter for Medicare annual wellness exam 06/04/2014   Obesity 06/04/2014   Estrogen deficiency 06/04/2014  Colon cancer screening 06/04/2014   Schatzki's ring 03/27/2014   OA (osteoarthritis) of knee 08/16/2011   Hemorrhoids 08/10/2011   Other dyspnea and respiratory abnormality 06/21/2010   Mixed incontinence 05/27/2010   OVERACTIVE BLADDER 03/12/2010   Lumbar degenerative disc disease 04/13/2009   GERD 10/15/2008   History of cardiovascular disorder 10/15/2008   Arthropathy, multiple sites 06/26/2008   EXTERNAL HEMORRHOIDS 02/01/2008    HIATAL HERNIA 02/01/2008   DIVERTICULOSIS OF COLON 02/01/2008   GASTRITIS, HX OF 02/01/2008   DYSPNEA 09/19/2007   Vitamin D deficiency 07/03/2007   Osteoporosis of lumbar spine 06/13/2007   Fatigue 44/03/4740   HELICOBACTER PYLORI INFECTION 06/06/2006   Hyperlipidemia 06/06/2006   Generalized anxiety disorder 06/06/2006   Adjustment disorder with mixed anxiety and depressed mood 06/06/2006   MACULAR DEGENERATION 06/06/2006   H/O: CVA (cerebrovascular accident) 06/06/2006   OSTEOARTHRITIS 06/06/2006    PCP: Abner Greenspan, MD  REFERRING PROVIDER: Abner Greenspan, MD  REFERRING DIAG:  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  Rationale for Evaluation and Treatment Rehabilitation  ONSET DATE: about a year and gotten worse  SUBJECTIVE:                                                                                                                                                                                           SUBJECTIVE STATEMENT: My daughter thought this would help me not have to go to the bathroom as much. A year ago I wasn't wearing depends because I knew when I was going to have to go. During the day I can wait 3 hours to go.  Nocturia is 2x in the middle of the night.  My body gets numb at night.  Pt reports that her husband passed away 3 years ago and she used to travel and do more activities.  She reports that in the last year she moved in with her son and it is hard for her because she went from taking care of everything at home to not doing any cleaning and housework. Fluid intake: Yes: sweet tea, beet juice, energy drink (verve)     PAIN:  Are you having pain? No  PRECAUTIONS: None  WEIGHT BEARING RESTRICTIONS No  FALLS:  Has patient fallen in last 6 months? No  LIVING ENVIRONMENT: Lives with: lives with their family and lives with their son Lives in:  House/apartment OCCUPATION: no  PLOF: Independent  PATIENT GOALS be able control urge, be able to not wear depends, reduce nocturia  PERTINENT HISTORY:  constipation/diarrhea, diaphragmatic hernia, osteoporosis; PMH  of TKA bil, abdomial hysterectomy, lumbar fusion with hardware Sexual abuse: No  BOWEL MOVEMENT Pain with bowel movement: Yes Type of bowel movement:Type (Bristol Stool Scale) loose stools, Frequency 1 at least daily, and Strain No Fully empty rectum: Yes:   Leakage: No Pads: No Fiber supplement: probiotics  URINATION Pain with urination: No Fully empty bladder: Yes: but have to sit longer Stream: Strong Urgency: Yes:   Frequency: normal other than at night Leakage: Urge to void and Walking to the bathroom usually 2/day Pads: Yes: 1 night in bed and 1 in the morning  INTERCOURSE   PREGNANCY Vaginal deliveries 3 Tearing No   PROLAPSE     OBJECTIVE:   DIAGNOSTIC FINDINGS:    PATIENT SURVEYS:    PFIQ-7 = 52  COGNITION:  Overall cognitive status: Within functional limits for tasks assessed     SENSATION:    MUSCLE LENGTH: Hamstrings: Right 60 deg; Left 60 deg   LUMBAR SPECIAL TESTS:    FUNCTIONAL TESTS:  Transfers and sit to stand with increased trunk flexion, increased time and UE support needed  GAIT: Distance walked: from waiting area to room 4 Assistive device utilized: Single point cane Level of assistance: Min A - UE support x1 on PT Comments: very flexed, antalgic on left, smaller stride length               POSTURE: rounded shoulders, forward head, decreased lumbar lordosis, flexed trunk , and weight shift right   PELVIC ALIGNMENT:  LUMBARAROM/PROM  A/PROM A/PROM  eval  Flexion 20%  Extension 0%  Right lateral flexion   Left lateral flexion   Right rotation   Left rotation    (Blank rows = not tested)  LOWER EXTREMITY ROM:  Passive ROM Right eval Left eval  Hip flexion 50% 50%  Hip extension    Hip  abduction    Hip adduction    Hip internal rotation    Hip external rotation 50% 50%  Knee flexion    Knee extension    Ankle dorsiflexion    Ankle plantarflexion    Ankle inversion    Ankle eversion     (Blank rows = not tested)  LOWER EXTREMITY MMT:  MMT Right eval Left eval  Hip flexion 5/5   Hip extension    Hip abduction    Hip adduction    Hip internal rotation 5/5 4/5  Hip external rotation 4/5 4/5  Knee flexion    Knee extension    Ankle dorsiflexion    Ankle plantarflexion    Ankle inversion    Ankle eversion      PALPATION:   General tight lumbar and thoracic paraspinals, upper traps tight; abdominal wall decreased tone                External Perineal Exam normal good vulvar skin condition with pink color                             Internal Pelvic Floor indroitus and tissues around hymen remnants are tight and stenosis present   Patient confirms identification and approves PT to assess internal pelvic floor and treatment Yes No emotional/communication barriers or cognitive limitation. Patient is motivated to learn. Patient understands and agrees with treatment goals and plan. PT explains patient will be examined in standing, sitting, and lying down to see how their muscles and joints work. When they are ready, they will be asked to remove  their underwear so PT can examine their perineum. The patient is also given the option of providing their own chaperone as one is not provided in our facility. The patient also has the right and is explained the right to defer or refuse any part of the evaluation or treatment including the internal exam. With the patient's consent, PT will use one gloved finger to gently assess the muscles of the pelvic floor, seeing how well it contracts and relaxes and if there is muscle symmetry. After, the patient will get dressed and PT and patient will discuss exam findings and plan of care. PT and patient discuss plan of care, schedule,  attendance policy and HEP activities.   PELVIC MMT:   MMT eval  Vaginal 2/5 for 4 reps; can hold up to 2 seconds  Internal Anal Sphincter   External Anal Sphincter   Puborectalis   Diastasis Recti   (Blank rows = not tested)        TONE: Slightly high levator muscles  PROLAPSE: None observed in supine with valsalva  TODAY'S TREATMENT   Treatment: 09/08/21  [place] Re-ed Education and cues for coordination of breathing and pelvic floor muscle contracting and relaxing at appropriate times  Self care activities: using estrogen and apply with finger to get it into the tissues    PATIENT EDUCATION:  Education details: estrogen application and urge techniques Person educated: Patient Education method: Explanation, Demonstration, Verbal cues, and Handouts Education comprehension: verbalized understanding   HOME EXERCISE PROGRAM:   ASSESSMENT:  CLINICAL IMPRESSION: Patient is a pleasant 87 y.o. female who was seen today for physical therapy evaluation and treatment for urinary urge and frequency. Pt has posture and gait impairments as mentioned above. Pt has core and pelvic floor weakness as mentioned above.  Pt ambulates with a cane and UE support min assist from PT for balance.  Pt has difficulty walking and standing and can do for short periods of time.  Pt has pelvic floor 2/5 MMT 4 reps and 2 sec hold max.  Pt is expected to progress slowly due to higher amount of comorbidities.  Pt will benefit from skilled PT to address impairments and improve overall function and return to higher level of activities.   OBJECTIVE IMPAIRMENTS decreased endurance, difficulty walking, decreased ROM, decreased strength, increased fascial restrictions, impaired flexibility, postural dysfunction, and pain.   ACTIVITY LIMITATIONS lifting, standing, and continence  PARTICIPATION LIMITATIONS: meal prep, cleaning, shopping, and community activity  PERSONAL FACTORS 3+ comorbidities:  constipation/diarrhea, diaphragmatic hernia, osteoporosis; PMH of TKA bil, abdomial hysterectomy, lumbar fusion with hardware  are also affecting patient's functional outcome.   REHAB POTENTIAL: Excellent  CLINICAL DECISION MAKING: Evolving/moderate complexity  EVALUATION COMPLEXITY: Moderate   GOALS: Goals reviewed with patient? Yes  SHORT TERM GOALS: Target date: 10/06/2021  Pt will be independent with initial HEP.  Baseline: Goal status: INITIAL  2.  Pt will notice 50% more time to get to the bathroom after the urge Baseline:  Goal status: INITIAL    LONG TERM GOALS: Target date: 12/01/2021   Pt will be independent with advanced HEP to maintain improvements made throughout therapy  Baseline:  Goal status: INITIAL  2.  Pt will report 75% reduction of pain when standing and walking due to improvements in posture, strength, and muscle length  Baseline:  Goal status: INITIAL  3.  Pt will be able to functional actions such as walking to the bathroom without leakage  Baseline:  Goal status: INITIAL  4.  Pt  will have to use 1 pads per day  Baseline: 2 Goal status: INITIAL  5.  Pt will feel confident to take a day trip due to reduced leakage and improved mobility Baseline:  Goal status: INITIAL   PLAN: PT FREQUENCY: 1x/week  PT DURATION: 12 weeks  PLANNED INTERVENTIONS: Therapeutic exercises, Therapeutic activity, Neuromuscular re-education, Balance training, Gait training, Patient/Family education, Self Care, Joint mobilization, Dry Needling, Electrical stimulation, Cryotherapy, Moist heat, Taping, Biofeedback, Manual therapy, and Re-evaluation  PLAN FOR NEXT SESSION: posture, soft tissue mobs to lumbar and gltueals and spine mobility; basic transversus abdominus ex's; review estrogen application   Camillo Flaming Yafet Cline, PT 10/27/2021, 8:26 AM

## 2021-10-27 NOTE — Telephone Encounter (Signed)
Spoke with patient. She is aware to hold Plavix 5 days prior procedure. No questions or concerns at the end of call.

## 2021-10-27 NOTE — Telephone Encounter (Signed)
Patient did not show for appointment.  Patient was called and PT left message to please call us back.  Gustavus Bryant, PT 10/27/21 12:53 PM

## 2021-10-27 NOTE — Progress Notes (Signed)
She is cleared to for EGD / to hold plavix

## 2021-10-28 ENCOUNTER — Telehealth: Payer: Self-pay

## 2021-10-28 DIAGNOSIS — K449 Diaphragmatic hernia without obstruction or gangrene: Secondary | ICD-10-CM

## 2021-10-28 DIAGNOSIS — R1319 Other dysphagia: Secondary | ICD-10-CM

## 2021-10-28 DIAGNOSIS — Z7901 Long term (current) use of anticoagulants: Secondary | ICD-10-CM

## 2021-10-28 DIAGNOSIS — K219 Gastro-esophageal reflux disease without esophagitis: Secondary | ICD-10-CM

## 2021-10-28 NOTE — Telephone Encounter (Signed)
Called and spoke with patient regarding Dr. Dayle Points recommendations. Pt is aware that I have placed the order and knows to expect a call from radiology scheduling to set up her appointment. Pt knows that we will be in touch with the results. Pt verbalized understanding and had no concerns at the end of the call.  Barium swallow study order in epic. Secure staff message sent to radiology scheduling to contact patient to set up appt.

## 2021-10-28 NOTE — Telephone Encounter (Signed)
-----   Message from [place], [place] sent at 10/28/2021 10:04 AM EDT ----- Regarding: Barium esophagram with tablet Can you call patient and let her know that Dr. Candis Schatz recommends a barium esophagram first which is an imaging study where we can assess the size of her hiatal hernia.  This should be done with tablet.  Please arrange thanks, JL L ----- Message ----- From: Daryel November, MD Sent: 10/27/2021   4:09 PM EDT To: Levin Erp, PA     ----- Message ----- From: Levin Erp, [place] Sent: 10/26/2021  10:35 AM EDT To: Daryel November, MD  Patient's chart sent to in lieu of Dr. Ardis Hughs absence.

## 2021-11-01 ENCOUNTER — Telehealth: Payer: Self-pay | Admitting: Family Medicine

## 2021-11-01 NOTE — Telephone Encounter (Signed)
We get faxes all the time about a back brace scam, pt notified and let her know we do not approve them

## 2021-11-01 NOTE — Telephone Encounter (Signed)
Patient called and stated she got a call about a Medicare new card, and about a back brace and she think she was being scammed. Call back number [phone].

## 2021-11-03 ENCOUNTER — Ambulatory Visit: Payer: Medicare Other | Admitting: Physical Therapy

## 2021-11-03 ENCOUNTER — Telehealth: Payer: Self-pay | Admitting: Family Medicine

## 2021-11-03 NOTE — Telephone Encounter (Signed)
[facility] called in stated they have Fax over a HIPA form to make PCP aware

## 2021-11-03 NOTE — Telephone Encounter (Signed)
We do not complete these due to possible scam risk in the past

## 2021-11-07 DIAGNOSIS — H353132 Nonexudative age-related macular degeneration, bilateral, intermediate dry stage: Secondary | ICD-10-CM | POA: Diagnosis not present

## 2021-11-10 ENCOUNTER — Encounter: Payer: Medicare Other | Admitting: Physical Therapy

## 2021-11-15 ENCOUNTER — Telehealth: Payer: Self-pay | Admitting: Family Medicine

## 2021-11-15 ENCOUNTER — Ambulatory Visit (HOSPITAL_COMMUNITY)
Admission: RE | Admit: 2021-11-15 | Discharge: 2021-11-15 | Disposition: A | Discharge status: Home / Self Care | Payer: Medicare Other | Source: Ambulatory Visit | Attending: Physician Assistant | Admitting: Physician Assistant

## 2021-11-15 DIAGNOSIS — K449 Diaphragmatic hernia without obstruction or gangrene: Secondary | ICD-10-CM | POA: Diagnosis not present

## 2021-11-15 DIAGNOSIS — R1319 Other dysphagia: Secondary | ICD-10-CM | POA: Diagnosis not present

## 2021-11-15 DIAGNOSIS — D3132 Benign neoplasm of left choroid: Secondary | ICD-10-CM | POA: Diagnosis not present

## 2021-11-15 DIAGNOSIS — H04123 Dry eye syndrome of bilateral lacrimal glands: Secondary | ICD-10-CM | POA: Diagnosis not present

## 2021-11-15 DIAGNOSIS — H353132 Nonexudative age-related macular degeneration, bilateral, intermediate dry stage: Secondary | ICD-10-CM | POA: Diagnosis not present

## 2021-11-15 DIAGNOSIS — K224 Dyskinesia of esophagus: Secondary | ICD-10-CM | POA: Diagnosis not present

## 2021-11-15 DIAGNOSIS — R1314 Dysphagia, pharyngoesophageal phase: Secondary | ICD-10-CM | POA: Diagnosis not present

## 2021-11-15 DIAGNOSIS — Z7901 Long term (current) use of anticoagulants: Secondary | ICD-10-CM | POA: Insufficient documentation

## 2021-11-15 DIAGNOSIS — K219 Gastro-esophageal reflux disease without esophagitis: Secondary | ICD-10-CM | POA: Insufficient documentation

## 2021-11-15 NOTE — Telephone Encounter (Signed)
Mary from [employer] called and stated they need the last office visit notes. Call back number [phone].

## 2021-11-15 NOTE — Telephone Encounter (Signed)
Clinical team has discuss this being a scam, will NOT be faxing OV to these people.  Their fax sometimes says [facility] and sometimes says [facility] and it's a scam  **DO NOT FAX ANYTHING TO THESE PEOPLE

## 2021-11-17 ENCOUNTER — Encounter: Payer: Self-pay | Admitting: Cardiovascular Disease

## 2021-11-17 ENCOUNTER — Ambulatory Visit: Payer: Medicare Other | Attending: Cardiovascular Disease | Admitting: Cardiovascular Disease

## 2021-11-17 ENCOUNTER — Encounter: Payer: Medicare Other | Admitting: Physical Therapy

## 2021-11-17 VITALS — BP 126/72 | HR 70 | Ht 65.0 in | Wt 199.6 lb

## 2021-11-17 DIAGNOSIS — I2510 Atherosclerotic heart disease of native coronary artery without angina pectoris: Secondary | ICD-10-CM | POA: Diagnosis not present

## 2021-11-17 DIAGNOSIS — I700 Atherosclerosis of aorta: Secondary | ICD-10-CM | POA: Diagnosis not present

## 2021-11-17 DIAGNOSIS — I5032 Chronic diastolic (congestive) heart failure: Secondary | ICD-10-CM | POA: Insufficient documentation

## 2021-11-17 DIAGNOSIS — E782 Mixed hyperlipidemia: Secondary | ICD-10-CM | POA: Diagnosis not present

## 2021-11-17 NOTE — Patient Instructions (Signed)
Medication Instructions:  Your physician recommends that you continue on your current medications as directed. Please refer to the Current Medication list given to you today.  *If you need a refill on your cardiac medications before your next appointment, please call your pharmacy*   Lab Work: NONE If you have labs (blood work) drawn today and your tests are completely normal, you will receive your results only by: [place] (if you have MyChart) OR A paper copy in the mail If you have any lab test that is abnormal or we need to change your treatment, we will call you to review the results.   Testing/Procedures: NONE  Follow-Up: At [facility], you and your health needs are our priority.  As part of our continuing mission to provide you with exceptional heart care, we have created designated Provider Care Teams.  These Care Teams include your primary Cardiologist (physician) and Advanced Practice Providers (APPs -  Physician Assistants and Nurse Practitioners) who all work together to provide you with the care you need, when you need it.  Your next appointment:   1 year(s)  The format for your next appointment:   In Person  Provider:   Sherren Mocha, MD  or APP     Important Information About [place]

## 2021-11-17 NOTE — Progress Notes (Unsigned)
Cardiology Office Note:    Date:  11/18/2021   ID:  [name] [name], DOB 1934-08-22, MRN [id number]  PCP:  Abner Greenspan, MD   [place] Providers Cardiologist:  Sherren Mocha, MD     Referring MD: Abner Greenspan, MD   Chief Complaint  Patient presents with   Shortness of Breath    History of Present Illness:    [name] [name] is a 87 y.o. female with a hx of: Palpitations  Hx of TIA Chronic shortness of breath  Myoview 02/2018: low risk  Echocardiogram 02/2018: EF 60-65, Gr 2 DD Echocardiogram 02/2021: EF 60-65, Gr 2 DD, mild to mod TR Heart failure with preserved ejection fraction  Coronary Ca2+ on Chest CT Aortic atherosclerosis  Hyperlipidemia  Hx of CVA MRI 11/21: remote R MCA area infarct DJD  Anxiety  GERD  Macular degeneration  Osteoporosis  Hx of COVID-19 in 12/2018 Long COVID  The patient is here with her daughter today.  She continues to struggle with shortness of breath with activity.  Is a longstanding complaint for her.  She has mild orthopnea, no PND, and no leg swelling.  She has no chest pain or pressure.  States that she has a lot of difficulty with sleep.  She stays up late watching TV or reading books.  She does not feel well rested in the mornings.  She does not recall ever being told that she snores and she has not been checked for sleep apnea.  Past Medical History:  Diagnosis Date   Alternating constipation and diarrhea    Anxiety state, unspecified    Complication of anesthesia    slow to wake up with surgery several years ago    COVID-19 virus infection 12/2018   Depressive disorder, not elsewhere classified    Diaphragmatic hernia without mention of obstruction or gangrene    Difficulty sleeping    Diverticulosis of colon (without mention of hemorrhage)    Esophageal reflux    External hemorrhoids without mention of complication    Fatigue    Frequency of urination    Helicobacter pylori (H. pylori)    Hiatal hernia     History of nuclear stress test    Myoview 05/2021: EF 70, normal perfusion; low risk   History of transfusion    Lumbar spondylosis    Macular degeneration (senile) of retina, unspecified    Osteoarthrosis, unspecified whether generalized or localized, unspecified site 08/11/2011   hx. "rhematoid arthritis", osteoarthritis, DDD, bursitis(hip)   Osteoporosis    Palpitations    PONV (postoperative nausea and vomiting)    Schatzki's ring    Shortness of breath 08/11/2011   with exertion only at present   Unspecified cerebral artery occlusion with cerebral infarction 08/11/2011   '97/ '06( TIA)-affected lt. side, slight weakness L side   Unspecified vitamin D deficiency     Past Surgical History:  Procedure Laterality Date   ABDOMINAL HYSTERECTOMY  08-11-11   APPENDECTOMY     BACK SURGERY  08-11-11   '11-hx. lumbar fusion with retained hardware   CATARACT EXTRACTION  08-11-11   Bilateral   ESOPHAGEAL DILATION     KNEE SURGERY  08-11-11   rt. knee scope   PATELLAR TENDON REPAIR Left 02/22/2014   Procedure: PATELLA TENDON REPAIR;  Surgeon: Gearlean Alf, MD;  Location: [facility];  Service: Orthopedics;  Laterality: Left;   PATELLAR TENDON REPAIR Left 06/25/2014   Procedure: LEFT PATELLA TENDON REPAIR;  Surgeon: Gaynelle Arabian,  MD;  Location: [facility];  Service: Orthopedics;  Laterality: Left;   TOTAL KNEE ARTHROPLASTY  08/16/2011   Procedure: TOTAL KNEE ARTHROPLASTY;  Surgeon: Gearlean Alf, MD;  Location: [facility];  Service: Orthopedics;  Laterality: Right;   TOTAL KNEE ARTHROPLASTY Left 01/27/2014   Procedure: LEFT TOTAL KNEE ARTHROPLASTY;  Surgeon: Gearlean Alf, MD;  Location: [facility];  Service: Orthopedics;  Laterality: Left;   TUBAL LIGATION      Current Medications: Current Meds  Medication Sig   acetaminophen (TYLENOL) 325 MG tablet Take 2 tablets (650 mg total) by mouth every 6 (six) hours as needed for mild pain (or Fever >/= 101).   APPLE CIDER VINEGAR PO Take 2 tablets by  mouth daily.   atorvastatin (LIPITOR) 10 MG tablet TAKE 1 TABLET DAILY   calcium carbonate (TUMS - DOSED IN MG ELEMENTAL CALCIUM) 500 MG chewable tablet Chew 1 tablet (200 mg of elemental calcium total) by mouth 4 (four) times daily as needed for indigestion or heartburn.   CALCIUM-MAGNESIUM-ZINC PO Take 1 capsule by mouth daily.   clopidogrel (PLAVIX) 75 MG tablet TAKE 1 TABLET DAILY   diclofenac sodium (VOLTAREN) 1 % GEL Apply 1 application topically 2 (two) times daily.   estradiol (ESTRACE VAGINAL) 0.1 MG/GM vaginal cream Use a pea sized amount of cream vaginally twice per week   famotidine (PEPCID) 40 MG tablet Take 1 tablet (40 mg total) by mouth 2 (two) times daily.   furosemide (LASIX) 20 MG tablet TAKE 1 TABLET DAILY   gabapentin (NEURONTIN) 100 MG capsule TAKE 2 CAPSULES THREE TIMES A DAY (Patient taking differently: 2 in the am, 2 in the pm, and 1 at bedtime)   HYDROcodone-acetaminophen (NORCO) 10-325 MG tablet Take 1 tablet by mouth every 6 (six) hours as needed.   metoprolol succinate (TOPROL-XL) 25 MG 24 hr tablet TAKE ONE-HALF (1/2) TABLET AT BEDTIME   Multiple Vitamins-Minerals (PRESERVISION AREDS PO) Take 1 capsule by mouth 2 (two) times daily.   pantoprazole (PROTONIX) 40 MG tablet Take 1 tablet (40 mg total) by mouth 2 (two) times daily.   Polyethyl Glycol-Propyl Glycol (SYSTANE OP) Apply 1-2 drops to eye 3 (three) times daily as needed (dry eyes.).    sertraline (ZOLOFT) 100 MG tablet TAKE 2 TABLETS DAILY     Allergies:   Alendronate sodium, Alendronate sodium, Celecoxib, Omeprazole, Protonix [pantoprazole sodium], Rabeprazole, and Latex   Social History   Socioeconomic History   Marital status: Widowed    Spouse name: Not on file   Number of children: 3   Years of education: Not on file   Highest education level: Not on file  Occupational History   Occupation: disabled    Employer: RETIRED  Tobacco Use   Smoking status: Never   Smokeless tobacco: Never  Vaping  Use   Vaping Use: Never used  Substance and Sexual Activity   Alcohol use: No    Alcohol/week: 0.0 standard drinks of alcohol   Drug use: No   Sexual activity: Never  Other Topics Concern   Not on file  Social History Narrative   Not on file   Social Determinants of Health   Financial Resource Strain: Not on file  Food Insecurity: Not on file  Transportation Needs: Not on file  Physical Activity: Not on file  Stress: Not on file  Social Connections: Not on file     Family History: The patient's family history includes Coronary artery disease in her brother; Diabetes in her brother and  sister; Heart disease in her brother and father; Pancreatic cancer in her daughter. There is no history of Colon cancer, Colon polyps, Esophageal cancer, or Gallbladder disease.  ROS:   Please see the history of present illness.    All other systems reviewed and are negative.  EKGs/Labs/Other Studies Reviewed:    The following studies were reviewed today: Myocardial Perfusion Imaging 05/20/2021:   The study is normal. The study is low risk.   No ST deviation was noted.   Left ventricular function is normal. Nuclear stress EF: 70 %. The left ventricular ejection fraction is hyperdynamic (>65%). End diastolic cavity size is normal. End systolic cavity size is normal.   Prior study available for comparison from 06/24/2015. No changes compared to prior study. Low risk, normal perfusion, LVEF 69%   Normal perfusion. LVEF 70% with normal wall motion. This is a low risk study. No change compared to prior study in 2017.  Echo 03/05/2021:  1. Left ventricular ejection fraction, by estimation, is 60 to 65%. The  left ventricle has normal function. The left ventricle has no regional  wall motion abnorma morning and have a little coffee and going to impression in the chair continue sleep through the just wake up in the morning, she will likely want to get up and move around  Is harder to sleep or  sedation  Cardiologist proactive place appointment at [facility] for practice particularly related generally just I cannot order scans patient's benign recommend control with he has some plaque buildup here he is to see Dr. Elyn Peers lities. There is mild left ventricular hypertrophy.  Left ventricular diastolic parameters  are consistent with Grade II diastolic dysfunction (pseudonormalization).   2. Right ventricular systolic function is normal. The right ventricular  size is normal.   3. Left atrial size was mildly dilated.   4. The mitral valve is grossly normal. No evidence of mitral valve  regurgitation.   5. Tricuspid valve regurgitation is mild to moderate.   6. Aortic valve regurgitation is mild. Aortic valve  sclerosis/calcification is present, without any evidence of aortic  stenosis.   7. The inferior vena cava is normal in size with greater than 50%  respiratory variability, suggesting right atrial pressure of 3 mmHg.   8. Cannot exclude a small PFO.   Comparison(s): No significant change from prior study.  EKG:  EKG is ordered today.  The ekg ordered today demonstrates normal sinus rhythm 70 bpm, rightward axis, otherwise normal  Recent Labs: 02/16/2021: NT-Pro BNP 164 05/12/2021: TSH 2.97 09/24/2021: ALT 15; BUN 11; Creatinine, Ser 0.93; Potassium 3.9; Sodium 141 09/29/2021: Hemoglobin 12.8; Platelets 255.0  Recent Lipid Panel    Component Value Date/Time   CHOL 186 02/16/2021 1550   TRIG 103 02/16/2021 1550   HDL 72 02/16/2021 1550   CHOLHDL 2.6 02/16/2021 1550   CHOLHDL 3 03/19/2020 1223   VLDL 24.6 03/19/2020 1223   LDLCALC 96 02/16/2021 1550     Risk Assessment/Calculations:                Physical Exam:    VS:  BP 126/72   Pulse 70   Ht '5\' 5"'$  (1.651 m)   Wt 199 lb 9.6 oz (90.5 kg)   SpO2 95%   BMI 33.22 kg/m     Wt Readings from Last 3 Encounters:  11/17/21 199 lb 9.6 oz (90.5 kg)  10/26/21 200 lb (90.7 kg)  09/29/21 200 lb (90.7 kg)     GEN:   Well  nourished, well developed elderly woman in no acute distress HEENT: Normal NECK: No JVD; No carotid bruits LYMPHATICS: No lymphadenopathy CARDIAC: RRR, no murmurs, rubs, gallops RESPIRATORY:  Clear to auscultation without rales, wheezing or rhonchi  ABDOMEN: Soft, non-tender, non-distended MUSCULOSKELETAL:  No edema; No deformity  SKIN: Warm and dry NEUROLOGIC:  Alert and oriented x 3 PSYCHIATRIC:  Normal affect   ASSESSMENT:    1. Chronic heart failure with preserved ejection fraction ([place])   2. Mixed hyperlipidemia   3. Aortic atherosclerosis (H[facility])    PLAN:    In order of problems listed above:  Patient has stable symptoms.  Suspect this is multifactorial with her advanced age, limited functional capacity due to advanced osteoarthritis, and diastolic dysfunction.  She will continue on low-dose furosemide.  She watches sodium in her diet and her blood pressure is well controlled.  No changes are made in her medical regimen today.  We discussed natural history and management strategies of diastolic heart failure today. Treated with atorvastatin.  Last lipids reviewed with cholesterol 186, LDL 96, HDL 72.  She has never had a coronary event.  Continue risk reduction measures. Continue statin drug and clopidogrel.       Medication Adjustments/Labs and Tests Ordered: Current medicines are reviewed at length with the patient today.  Concerns regarding medicines are outlined above.  Orders Placed This Encounter  Procedures   EKG 12-Lead   No orders of the defined types were placed in this encounter.   Patient Instructions  Medication Instructions:  Your physician recommends that you continue on your current medications as directed. Please refer to the Current Medication list given to you today.  *If you need a refill on your cardiac medications before your next appointment, please call your pharmacy*   Lab Work: NONE If you have labs (blood work) drawn today and your  tests are completely normal, you will receive your results only by: [place] (if you have MyChart) OR A paper copy in the mail If you have any lab test that is abnormal or we need to change your treatment, we will call you to review the results.   Testing/Procedures: NONE  Follow-Up: At [facility], you and your health needs are our priority.  As part of our continuing mission to provide you with exceptional heart care, we have created designated Provider Care Teams.  These Care Teams include your primary Cardiologist (physician) and Advanced Practice Providers (APPs -  Physician Assistants and Nurse Practitioners) who all work together to provide you with the care you need, when you need it.  Your next appointment:   1 year(s)  The format for your next appointment:   In Person  Provider:   Sherren Mocha, MD  or APP     Important Information About Sugar         Signed, Sherren Mocha, MD  11/18/2021 2:28 PM    [place]

## 2021-11-25 ENCOUNTER — Other Ambulatory Visit: Payer: Self-pay

## 2021-11-25 ENCOUNTER — Encounter (HOSPITAL_COMMUNITY): Payer: Self-pay | Admitting: Gastroenterology

## 2021-12-05 NOTE — Anesthesia Preprocedure Evaluation (Addendum)
Anesthesia Evaluation  Patient identified by MRN, date of birth, ID band Patient awake    Reviewed: Allergy & Precautions, NPO status , Patient's Chart, lab work & pertinent test results  History of Anesthesia Complications (+) PONV and history of anesthetic complications  Airway Mallampati: I  TM Distance: >3 FB Neck ROM: Full    Dental no notable dental hx. (+) Upper Dentures, Lower Dentures   Pulmonary shortness of breath   Pulmonary exam normal breath sounds clear to auscultation       Cardiovascular hypertension, Pt. on home beta blockers + CAD and +CHF (with Preserved EF)  Normal cardiovascular exam+ Valvular Problems/Murmurs (mild to mod TR)  Rhythm:Regular Rate:Normal  05/2021 Myoview  The study is normal. The study is low risk.   No ST deviation was noted.   Left ventricular function is normal. Nuclear stress EF: 70 %. The left ventricular ejection fraction is hyperdynamic (>65%). End diastolic cavity size is normal. End systolic cavity size is normal.   Prior study available for comparison from 06/24/2015. No changes compared to prior study. Low risk, normal perfusion, LVEF 69%   02/2021 ECHO 1. Left ventricular ejection fraction, by estimation, is 60 to 65%. The  left ventricle has normal function. The left ventricle has no regional  wall motion abnormalities. There is mild left ventricular hypertrophy.  Left ventricular diastolic parameters  are consistent with Grade II diastolic dysfunction (pseudonormalization).   2. Right ventricular systolic function is normal. The right ventricular  size is normal.   3. Left atrial size was mildly dilated.   4. The mitral valve is grossly normal. No evidence of mitral valve  regurgitation.   5. Tricuspid valve regurgitation is mild to moderate.   6. Aortic valve regurgitation is mild. Aortic valve  sclerosis/calcification is present, without any evidence of aortic  stenosis.    7. The inferior vena cava is normal in size with greater than 50%  respiratory variability, suggesting right atrial pressure of 3 mmHg.   8. Cannot exclude a small PFO.      Neuro/Psych  PSYCHIATRIC DISORDERS Anxiety Depression    TIA   GI/Hepatic hiatal hernia,GERD  ,,  Endo/Other    Renal/GU      Musculoskeletal  (+) Arthritis ,    Abdominal  (+) + obese (BMI 33)  Peds  Hematology Lab Results      Component                Value               Date                          HGB                      12.8                09/29/2021                HCT                      39.2                09/29/2021                MCV                      85.7  09/29/2021                PLT                      255.0               09/29/2021           Pt on plavix   Anesthesia Other Findings ALL: Celecoxib, omeprazole, pantoprazole, Rabeprozole, Latex  Reproductive/Obstetrics                             Anesthesia Physical Anesthesia Plan  ASA: 3  Anesthesia Plan: MAC   Post-op Pain Management:    Induction: Intravenous  PONV Risk Score and Plan: Treatment may vary due to age or medical condition and Ondansetron  Airway Management Planned: Natural Airway and Nasal Cannula  Additional Equipment:   Intra-op Plan:   Post-operative Plan:   Informed Consent: I have reviewed the patients History and Physical, chart, labs and discussed the procedure including the risks, benefits and alternatives for the proposed anesthesia with the patient or authorized representative who has indicated his/her understanding and acceptance.     Dental advisory given  Plan Discussed with:   Anesthesia Plan Comments: (Dysphagia and GERD for EGD w Dilitation)       Anesthesia Quick Evaluation

## 2021-12-06 ENCOUNTER — Encounter (HOSPITAL_COMMUNITY)
Admission: RE | Disposition: A | Discharge status: Home / Self Care | Payer: Self-pay | Source: Home / Self Care | Attending: Gastroenterology

## 2021-12-06 ENCOUNTER — Other Ambulatory Visit: Payer: Self-pay

## 2021-12-06 ENCOUNTER — Ambulatory Visit (HOSPITAL_COMMUNITY)
Admission: RE | Admit: 2021-12-06 | Discharge: 2021-12-06 | Disposition: A | Discharge status: Home / Self Care | Payer: Medicare Other | Attending: Gastroenterology | Admitting: Gastroenterology

## 2021-12-06 ENCOUNTER — Ambulatory Visit (HOSPITAL_BASED_OUTPATIENT_CLINIC_OR_DEPARTMENT_OTHER): Payer: Medicare Other | Admitting: Anesthesiology

## 2021-12-06 ENCOUNTER — Ambulatory Visit (HOSPITAL_COMMUNITY): Payer: Medicare Other | Admitting: Anesthesiology

## 2021-12-06 DIAGNOSIS — F32A Depression, unspecified: Secondary | ICD-10-CM | POA: Diagnosis not present

## 2021-12-06 DIAGNOSIS — I700 Atherosclerosis of aorta: Secondary | ICD-10-CM | POA: Insufficient documentation

## 2021-12-06 DIAGNOSIS — I509 Heart failure, unspecified: Secondary | ICD-10-CM | POA: Diagnosis not present

## 2021-12-06 DIAGNOSIS — K222 Esophageal obstruction: Secondary | ICD-10-CM | POA: Diagnosis not present

## 2021-12-06 DIAGNOSIS — Q399 Congenital malformation of esophagus, unspecified: Secondary | ICD-10-CM | POA: Diagnosis not present

## 2021-12-06 DIAGNOSIS — M1990 Unspecified osteoarthritis, unspecified site: Secondary | ICD-10-CM | POA: Insufficient documentation

## 2021-12-06 DIAGNOSIS — Z79899 Other long term (current) drug therapy: Secondary | ICD-10-CM | POA: Diagnosis not present

## 2021-12-06 DIAGNOSIS — K449 Diaphragmatic hernia without obstruction or gangrene: Secondary | ICD-10-CM | POA: Insufficient documentation

## 2021-12-06 DIAGNOSIS — I080 Rheumatic disorders of both mitral and aortic valves: Secondary | ICD-10-CM | POA: Insufficient documentation

## 2021-12-06 DIAGNOSIS — Z7902 Long term (current) use of antithrombotics/antiplatelets: Secondary | ICD-10-CM | POA: Insufficient documentation

## 2021-12-06 DIAGNOSIS — I2510 Atherosclerotic heart disease of native coronary artery without angina pectoris: Secondary | ICD-10-CM | POA: Insufficient documentation

## 2021-12-06 DIAGNOSIS — Z6833 Body mass index (BMI) 33.0-33.9, adult: Secondary | ICD-10-CM | POA: Diagnosis not present

## 2021-12-06 DIAGNOSIS — I110 Hypertensive heart disease with heart failure: Secondary | ICD-10-CM

## 2021-12-06 DIAGNOSIS — R1319 Other dysphagia: Secondary | ICD-10-CM

## 2021-12-06 DIAGNOSIS — K219 Gastro-esophageal reflux disease without esophagitis: Secondary | ICD-10-CM | POA: Diagnosis not present

## 2021-12-06 DIAGNOSIS — F419 Anxiety disorder, unspecified: Secondary | ICD-10-CM | POA: Diagnosis not present

## 2021-12-06 DIAGNOSIS — E669 Obesity, unspecified: Secondary | ICD-10-CM | POA: Insufficient documentation

## 2021-12-06 DIAGNOSIS — R1310 Dysphagia, unspecified: Secondary | ICD-10-CM | POA: Insufficient documentation

## 2021-12-06 HISTORY — DX: Headache, unspecified: R51.9

## 2021-12-06 HISTORY — PX: ESOPHAGOGASTRODUODENOSCOPY (EGD) WITH PROPOFOL: SHX5813

## 2021-12-06 HISTORY — DX: Family history of other specified conditions: Z84.89

## 2021-12-06 HISTORY — DX: Heart failure, unspecified: I50.9

## 2021-12-06 HISTORY — PX: SAVORY DILATION: SHX5439

## 2021-12-06 HISTORY — DX: Unspecified macular degeneration: H35.30

## 2021-12-06 SURGERY — ESOPHAGOGASTRODUODENOSCOPY (EGD) WITH PROPOFOL
Anesthesia: Monitor Anesthesia Care

## 2021-12-06 MED ORDER — CLOPIDOGREL BISULFATE 75 MG PO TABS: 75.0000 mg | ORAL_TABLET | Freq: Every day | ORAL | 3 refills | Status: DC

## 2021-12-06 MED ORDER — ONDANSETRON HCL 4 MG/2ML IJ SOLN
INTRAMUSCULAR | Status: DC | PRN
  Administered 2021-12-06: 4 mg via INTRAVENOUS

## 2021-12-06 MED ORDER — PROPOFOL 10 MG/ML IV BOLUS
INTRAVENOUS | Status: DC | PRN
  Administered 2021-12-06: 40 mg via INTRAVENOUS
  Administered 2021-12-06 (×2): 10 mg via INTRAVENOUS
  Administered 2021-12-06: 20 mg via INTRAVENOUS

## 2021-12-06 MED ORDER — SODIUM CHLORIDE 0.9 % IV SOLN: INTRAVENOUS | Status: DC

## 2021-12-06 MED ORDER — LACTATED RINGERS IV SOLN
INTRAVENOUS | Status: AC | PRN
  Administered 2021-12-06: 1000 mL via INTRAVENOUS

## 2021-12-06 MED ORDER — LIDOCAINE 2% (20 MG/ML) 5 ML SYRINGE
INTRAMUSCULAR | Status: DC | PRN
  Administered 2021-12-06: 100 mg via INTRAVENOUS

## 2021-12-06 MED ORDER — PROPOFOL 500 MG/50ML IV EMUL
INTRAVENOUS | Status: DC | PRN
  Administered 2021-12-06: 100 ug/kg/min via INTRAVENOUS

## 2021-12-06 SURGICAL SUPPLY — 15 items

## 2021-12-06 NOTE — H&P (Signed)
[facility] History and Physical   Primary Care Physician:  Tower, Wynelle Fanny, MD   Reason for Procedure:   Dysphagia  Plan:    EGD with dilation     HPI: [name] [name] is a 87 y.o. female undergoing EGD with dilation for therapy of dysphagia.  She has worsening solid dysphagia as well as persistent GERD symptoms despite PPI use.  Recent barium swallow demonstrated a prominent CP bar, large hiatal hernia and delayed passage of the barium tablet at the GEJ, suspected stricture/ring.  She has a history of Schatzki ring that was previously dilated, most recently in 2016.   Past Medical History:  Diagnosis Date   Alternating constipation and diarrhea    Anxiety state, unspecified    CHF (congestive heart failure) (HCC)    Complication of anesthesia    slow to wake up with surgery several years ago    COVID-19 virus infection 12/2018   Depressive disorder, not elsewhere classified    Diaphragmatic hernia without mention of obstruction or gangrene    Difficulty sleeping    Diverticulosis of colon (without mention of hemorrhage)    Esophageal reflux    External hemorrhoids without mention of complication    Family history of adverse reaction to anesthesia    Dad very slow to wake upi   Fatigue    Frequency of urination    Gallstones    Headache    Helicobacter pylori (H. pylori)    Hiatal hernia    History of nuclear stress test    Myoview 05/2021: EF 70, normal perfusion; low risk   History of transfusion    Lumbar spondylosis    Macular degeneration    Macular degeneration (senile) of retina, unspecified    Osteoarthrosis, unspecified whether generalized or localized, unspecified site 08/11/2011   hx. "rhematoid arthritis", osteoarthritis, DDD, bursitis(hip)   Osteoporosis    Palpitations    PONV (postoperative nausea and vomiting)    Schatzki's ring    Shortness of breath 08/11/2011   with exertion only at present   Unspecified cerebral artery occlusion  with cerebral infarction 08/11/2011   '97/ '06( TIA)-affected lt. side, slight weakness L side   Unspecified vitamin D deficiency     Past Surgical History:  Procedure Laterality Date   ABDOMINAL HYSTERECTOMY  08-11-11   APPENDECTOMY     BACK SURGERY  08-11-11   '11-hx. lumbar fusion with retained hardware   CATARACT EXTRACTION  08-11-11   Bilateral   ESOPHAGEAL DILATION     KNEE SURGERY  08-11-11   rt. knee scope   PATELLAR TENDON REPAIR Left 02/22/2014   Procedure: PATELLA TENDON REPAIR;  Surgeon: Gearlean Alf, MD;  Location: [facility];  Service: Orthopedics;  Laterality: Left;   PATELLAR TENDON REPAIR Left 06/25/2014   Procedure: LEFT PATELLA TENDON REPAIR;  Surgeon: Gaynelle Arabian, MD;  Location: [facility];  Service: Orthopedics;  Laterality: Left;   TOTAL KNEE ARTHROPLASTY  08/16/2011   Procedure: TOTAL KNEE ARTHROPLASTY;  Surgeon: Gearlean Alf, MD;  Location: [facility];  Service: Orthopedics;  Laterality: Right;   TOTAL KNEE ARTHROPLASTY Left 01/27/2014   Procedure: LEFT TOTAL KNEE ARTHROPLASTY;  Surgeon: Gearlean Alf, MD;  Location: [facility];  Service: Orthopedics;  Laterality: Left;   TUBAL LIGATION      Prior to Admission medications   Medication Sig Start Date End Date Taking? Authorizing Provider  acetaminophen (TYLENOL) 325 MG tablet Take 2 tablets (650 mg total) by mouth every 6 (six)  hours as needed for mild pain (or Fever >/= 101). 01/29/14  Yes Perkins, Alexzandrew L, PA-C  APPLE CIDER VINEGAR PO Take 2 tablets by mouth at bedtime.   Yes [provider]  atorvastatin (LIPITOR) 10 MG tablet TAKE 1 TABLET DAILY 07/02/21  Yes Tower, Wynelle Fanny, MD  calcium carbonate (TUMS - DOSED IN MG ELEMENTAL CALCIUM) 500 MG chewable tablet Chew 1 tablet (200 mg of elemental calcium total) by mouth 4 (four) times daily as needed for indigestion or heartburn. 02/25/14  Yes Perkins, Alexzandrew L, PA-C  diclofenac sodium (VOLTAREN) 1 % GEL Apply 1 application  topically at bedtime. 12/22/15  Yes  [provider]  estradiol (ESTRACE VAGINAL) 0.1 MG/GM vaginal cream Use a pea sized amount of cream vaginally twice per week Patient taking differently: Place 1 Applicatorful vaginally daily as needed. 07/07/21  Yes Tower, Wynelle Fanny, MD  famotidine (PEPCID) 40 MG tablet Take 1 tablet (40 mg total) by mouth 2 (two) times daily. Patient taking differently: Take 40 mg by mouth daily. 10/26/21  Yes Levin Erp, PA  furosemide (LASIX) 20 MG tablet TAKE 1 TABLET DAILY 10/12/21  Yes Sherren Mocha, MD  gabapentin (NEURONTIN) 100 MG capsule TAKE 2 CAPSULES THREE TIMES A DAY Patient taking differently: Take 100-200 mg by mouth 2 (two) times daily. Take 200 mg in the morning, 100 mg at noon, and 100 mg at bedtime 07/22/21  Yes Tower, Wynelle Fanny, MD  Liniments (ABSORBINE ARTHRITIS STRENGTH EX) Apply 1 application  topically daily as needed (Back pain).   Yes [provider]  loperamide (IMODIUM) 2 MG capsule Take 2 mg by mouth as needed for diarrhea or loose stools.   Yes [provider]  metoprolol succinate (TOPROL-XL) 25 MG 24 hr tablet TAKE ONE-HALF (1/2) TABLET AT BEDTIME 08/17/21  Yes Weaver, Verlin Duke T, PA-C  Multiple Vitamins-Minerals (PRESERVISION AREDS PO) Take 1 capsule by mouth 2 (two) times daily.   Yes [provider]  nitroGLYCERIN (NITROSTAT) 0.4 MG SL tablet Place 1 tablet (0.4 mg total) under the tongue every 5 (five) minutes as needed for chest pain. 05/07/21 12/01/21 Yes Weaver, Ellerie Arenz T, PA-C  pantoprazole (PROTONIX) 40 MG tablet Take 1 tablet (40 mg total) by mouth 2 (two) times daily. 10/26/21  Yes Lemmon, Lavone Nian, PA  Polyethyl Glycol-Propyl Glycol (SYSTANE OP) Apply 1-2 drops to eye 3 (three) times daily as needed (dry eyes.).    Yes [provider]  sertraline (ZOLOFT) 100 MG tablet TAKE 2 TABLETS DAILY 04/02/21  Yes Tower, Wynelle Fanny, MD  Turmeric Curcumin 500 MG CAPS Take 1,000 mg by mouth daily.   Yes [provider]   clopidogrel (PLAVIX) 75 MG tablet TAKE 1 TABLET DAILY 06/15/21   Tower, Wynelle Fanny, MD    Current Facility-Administered Medications  Medication Dose Route Frequency Provider Last Rate Last Admin   0.9 %  sodium chloride infusion   Intravenous Continuous Levin Erp, PA       lactated ringers infusion    Continuous PRN Daryel November, MD 10 mL/hr at 12/06/21 1118 Continued from Pre-op at 12/06/21 1118    Allergies as of 10/26/2021 - Review Complete 10/26/2021  Allergen Reaction Noted   Alendronate sodium  03/14/2006   Alendronate sodium Other (See Comments) 03/14/2006   Celecoxib Other (See Comments) 03/14/2006   Omeprazole Nausea Only 09/03/2015   Protonix [pantoprazole sodium] Nausea And Vomiting 09/03/2015   Rabeprazole Nausea And Vomiting 09/03/2015   Latex Itching and Rash 12/22/2008  Family History  Problem Relation Age of Onset   Heart disease Father    Coronary artery disease Brother    Diabetes Brother    Heart disease Brother    Pancreatic cancer Daughter    Diabetes Sister    Colon cancer Neg Hx    Colon polyps Neg Hx    Esophageal cancer Neg Hx    Gallbladder disease Neg Hx     Social History   Socioeconomic History   Marital status: Widowed    Spouse name: Not on file   Number of children: 3   Years of education: Not on file   Highest education level: Not on file  Occupational History   Occupation: disabled    Employer: RETIRED  Tobacco Use   Smoking status: Never   Smokeless tobacco: Never  Vaping Use   Vaping Use: Never used  Substance and Sexual Activity   Alcohol use: No    Alcohol/week: 0.0 standard drinks of alcohol   Drug use: No   Sexual activity: Not Currently  Other Topics Concern   Not on file  Social History Narrative   Not on file   Social Determinants of Health   Financial Resource Strain: Not on file  Food Insecurity: Not on file  Transportation Needs: Not on file  Physical Activity: Not on file  Stress: Not  on file  Social Connections: Not on file  Intimate Partner Violence: Not on file    Review of Systems:  All other review of systems negative except as mentioned in the HPI.  Physical Exam: Vital signs BP (!) 144/55   Pulse (!) 54   Temp 97.7 F (36.5 C) (Temporal)   Resp 18   Ht '5\' 5"'$  (1.651 m)   Wt 90.7 kg   SpO2 97%   BMI 33.28 kg/m   General:   Alert,  Well-developed, well-nourished, pleasant and cooperative in NAD Airway:  Mallampati 2 Lungs:  Clear throughout to auscultation.   Heart:  Regular rate and rhythm; no murmurs, clicks, rubs,  or gallops. Abdomen:  Soft, nontender and nondistended. Normal bowel sounds.   Neuro/Psych:  Normal mood and affect. A and O x 3   Joaquim Tolen E. Candis Schatz, MD [facility]

## 2021-12-06 NOTE — Op Note (Signed)
[facility] Patient Name: [name] [name] Procedure Date: 12/06/2021 MRN: [id number] Attending MD: Gladstone Pih. Candis [name] , MD, [id number] Date of Birth: [birth year] CSN: [id number] Age: 87 Admit Type: Outpatient Procedure:                Upper GI endoscopy Indications:              Dysphagia (patient with prominent CP bar, tortuous                            distal esophagus and large hiatal hernia on barium                            swallow) Providers:                Gladstone Pih. Candis Schatz, MD, Brien Mates,                            Technician, Velva Harman, RN Referring MD:              Medicines:                Monitored Anesthesia Care Complications:            No immediate complications. Estimated Blood Loss:     Estimated blood loss was minimal. Procedure:                Pre-Anesthesia Assessment:                           - Prior to the procedure, a History and Physical                            was performed, and patient medications and                            allergies were reviewed. The patient's tolerance of                            previous anesthesia was also reviewed. The risks                            and benefits of the procedure and the sedation                            options and risks were discussed with the patient.                            All questions were answered, and informed consent                            was obtained. Prior Anticoagulants: The patient has                            taken Plavix (clopidogrel), last dose was 6 days  prior to procedure. ASA Grade Assessment: III - A                            patient with severe systemic disease. After                            reviewing the risks and benefits, the patient was                            deemed in satisfactory condition to undergo the                            procedure.                           After obtaining informed  consent, the endoscope was                            passed under direct vision. Throughout the                            procedure, the patient's blood pressure, pulse, and                            oxygen saturations were monitored continuously. The                            [id number]-H190 ([id number]) Olympus endoscope was introduced                            through the mouth, and advanced to the second part                            of duodenum. The upper GI endoscopy was                            accomplished without difficulty. The patient                            tolerated the procedure well. Scope In: Scope Out: Findings:      The examined portions of the nasopharynx, oropharynx and larynx were       normal.      The distal esophagus was moderately tortuous.      One mild stenosis was found at the cricopharyngeus as appreciated by       mild resistance to passage of the endoscope into the esophagus. This       stenosis measured less than one cm (in length). The stenosis was       traversed. A guidewire was placed and the scope was withdrawn. Dilation       was performed with a Savary dilator with mild resistance at 16 mm. The       dilation site was examined and showed mild improvement in luminal       narrowing with heme at the UES. Estimated blood loss was minimal.  A 9 cm hiatal hernia was present.      The exam of the stomach was otherwise normal.      The examined duodenum was normal. Impression:               - Normal upper airway.                           - Tortuous esophagus.                           - 9 cm hiatal hernia.                           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 Dilated.                           - Normal examined duodenum.                           - No specimens collected.                           - Suspect patient's dysphagia is multifactorial,                            with her hiatal hernia, tortuous, angulated distal                             esophagus and CP bar all contributing. A                            significant narrowing of the distal esophagus was                            not appreciated, and no mucosal rent was acheived                            with dilation to 16 mm. Moderate Sedation:      Not Applicable - Patient had care per Anesthesia. Recommendation:           - Patient has a contact number available for                            emergencies. The signs and symptoms of potential                            delayed complications were discussed with the                            patient. Return to normal activities tomorrow.                            Written discharge instructions were provided to the                            patient.                           -  Resume previous diet.                           - Continue present medications.                           - Repeat upper endoscopy with dilation as needed                            for retreatment.                           - Resume Plavix (clopidogrel) at prior dose                            tomorrow. Procedure Code(s):        --- Professional ---                           [phone], Esophagogastroduodenoscopy, flexible,                            transoral; with insertion of guide wire followed by                            passage of dilator(s) through esophagus over guide                            wire Diagnosis Code(s):        --- Professional ---                           Q39.9, Congenital malformation of esophagus,                            unspecified                           K44.9, Diaphragmatic hernia without obstruction or                            gangrene                           K22.2, Esophageal obstruction                           R13.10, Dysphagia, unspecified CPT copyright 2022 American Medical Association. All rights reserved. The codes documented in this report are preliminary and upon coder review may  be  revised to meet current compliance requirements. [name] Shad E. Candis Schatz, MD 12/06/2021 12:31:48 PM This report has been signed electronically. Number of Addenda: 0

## 2021-12-06 NOTE — Transfer of Care (Signed)
Immediate Anesthesia Transfer of Care Note  Patient: Lisandra Mathisen Pressnell  Procedure(s) Performed: Procedure(s): ESOPHAGOGASTRODUODENOSCOPY (EGD) WITH PROPOFOL (N/A) SAVORY DILATION (N/A)  Patient Location: PACU  Anesthesia Type:MAC  Level of Consciousness: Patient easily awoken, sedated, comfortable, cooperative, following commands, responds to stimulation.   Airway & Oxygen Therapy: Patient spontaneously breathing, ventilating well, oxygen via simple oxygen mask.  Post-op Assessment: Report given to PACU RN, vital signs reviewed and stable, moving all extremities.   Post vital signs: Reviewed and stable.  Complications: No apparent anesthesia complications  Last Vitals:  Vitals Value Taken Time  BP 119/52 12/06/21 1228  Temp 36.3 C 12/06/21 1227  Pulse 61 12/06/21 1229  Resp 19 12/06/21 1229  SpO2 100 % 12/06/21 1229  Vitals shown include unvalidated device data.  Last Pain:  Vitals:   12/06/21 1227  TempSrc: Temporal  PainSc:          Complications: No notable events documented.

## 2021-12-06 NOTE — Discharge Instructions (Signed)
YOU HAD AN ENDOSCOPIC PROCEDURE TODAY: Refer to the procedure report and other information in the discharge instructions given to you for any specific questions about what was found during the examination. If this information does not answer your questions, please call [place] office at [phone] to clarify.  ° °YOU SHOULD EXPECT: Some feelings of bloating in the abdomen. Passage of more gas than usual. Walking can help get rid of the air that was put into your GI tract during the procedure and reduce the bloating. If you had a lower endoscopy (such as a colonoscopy or flexible sigmoidoscopy) you may notice spotting of blood in your stool or on the toilet paper. Some abdominal soreness may be present for a day or two, also. ° °DIET: Your first meal following the procedure should be a light meal and then it is ok to progress to your normal diet. A half-sandwich or bowl of soup is an example of a good first meal. Heavy or fried foods are harder to digest and may make you feel nauseous or bloated. Drink plenty of fluids but you should avoid alcoholic beverages for 24 hours. If you had a esophageal dilation, please see attached instructions for diet.   ° °ACTIVITY: Your care partner should take you home directly after the procedure. You should plan to take it easy, moving slowly for the rest of the day. You can resume normal activity the day after the procedure however YOU SHOULD NOT DRIVE, use power tools, machinery or perform tasks that involve climbing or major physical exertion for 24 hours (because of the sedation medicines used during the test).  ° °SYMPTOMS TO REPORT IMMEDIATELY: °A gastroenterologist can be reached at any hour. Please call [phone]  for any of the following symptoms:  °Following lower endoscopy (colonoscopy, flexible sigmoidoscopy) °Excessive amounts of blood in the stool  °Significant tenderness, worsening of abdominal pains  °Swelling of the abdomen that is new, acute  °Fever of 100° or  higher  °Following upper endoscopy (EGD, EUS, ERCP, esophageal dilation) °Vomiting of blood or coffee ground material  °New, significant abdominal pain  °New, significant chest pain or pain under the shoulder blades  °Painful or persistently difficult swallowing  °New shortness of breath  °Black, tarry-looking or red, bloody stools ° °FOLLOW UP:  °If any biopsies were taken you will be contacted by phone or by letter within the next 1-3 weeks. Call [phone]  if you have not heard about the biopsies in 3 weeks.  °Please also call with any specific questions about appointments or follow up tests. ° °

## 2021-12-06 NOTE — Anesthesia Procedure Notes (Signed)
Procedure Name: MAC Date/Time: 12/06/2021 12:05 PM  Performed by: Deliah Boston, CRNAPre-anesthesia Checklist: Patient identified, Emergency Drugs available, Suction available and Patient being monitored Patient Re-evaluated:Patient Re-evaluated prior to induction Oxygen Delivery Method: Simple face mask Preoxygenation: Pre-oxygenation with 100% oxygen Placement Confirmation: positive ETCO2 and breath sounds checked- equal and bilateral

## 2021-12-06 NOTE — Anesthesia Postprocedure Evaluation (Signed)
Anesthesia Post Note  Patient: [name] [name]  Procedure(s) Performed: ESOPHAGOGASTRODUODENOSCOPY (EGD) WITH PROPOFOL SAVORY DILATION     Patient location during evaluation: Endoscopy Anesthesia Type: MAC Level of consciousness: awake and alert Pain management: pain level controlled Vital Signs Assessment: post-procedure vital signs reviewed and stable Respiratory status: spontaneous breathing, nonlabored ventilation, respiratory function stable and patient connected to nasal cannula oxygen Cardiovascular status: blood pressure returned to baseline and stable Postop Assessment: no apparent nausea or vomiting Anesthetic complications: no  No notable events documented.  Last Vitals:  Vitals:   12/06/21 1230 12/06/21 1250  BP: (!) 132/49 (!) 123/50  Pulse: 62 (!) 55  Resp: 20 15  Temp:    SpO2: 100% 95%    Last Pain:  Vitals:   12/06/21 1250  TempSrc:   PainSc: 0-No pain                 [name]

## 2021-12-07 ENCOUNTER — Encounter: Payer: Self-pay | Admitting: Family Medicine

## 2021-12-07 DIAGNOSIS — H353132 Nonexudative age-related macular degeneration, bilateral, intermediate dry stage: Secondary | ICD-10-CM | POA: Diagnosis not present

## 2021-12-08 ENCOUNTER — Telehealth: Payer: Self-pay

## 2021-12-08 NOTE — Telephone Encounter (Signed)
-----   Message from Daryel November, MD sent at 12/06/2021  1:23 PM EST ----- Regarding: General surgery referral [facility] Basta,  Can you please place a referral to Dr. Greer Pickerel of [facility] for consideration of hiatal hernia repair/fundoplication?  Thanks,

## 2021-12-08 NOTE — Telephone Encounter (Signed)
Referral faxed to [facility] 

## 2021-12-10 ENCOUNTER — Encounter (HOSPITAL_COMMUNITY): Payer: Self-pay | Admitting: Gastroenterology

## 2021-12-14 ENCOUNTER — Telehealth: Payer: Self-pay | Admitting: Family Medicine

## 2021-12-14 MED ORDER — UP4 PROBIOTICS WOMENS PO CAPS: ORAL_CAPSULE | ORAL | 3 refills | Status: DC

## 2021-12-14 NOTE — Telephone Encounter (Signed)
Sent!

## 2021-12-14 NOTE — Telephone Encounter (Signed)
Pt called in stated would like PCP to call in a scrip for woman's Probiotic to [place]  . Please advise # [phone]

## 2021-12-23 DIAGNOSIS — Z23 Encounter for immunization: Secondary | ICD-10-CM | POA: Diagnosis not present

## 2021-12-27 ENCOUNTER — Telehealth: Payer: Self-pay | Admitting: Family Medicine

## 2021-12-27 NOTE — Telephone Encounter (Signed)
Patient called and stated she is having severe pain in her fingers and also when laying down she is having numbness in her whole body and she has to get up to walk it off. Patient was sent to access nurse.

## 2021-12-27 NOTE — Telephone Encounter (Signed)
Per appt notes pt already has appt with Dr Glori Bickers on 12/28/21 at 12 noon. Sending note to Dr Glori Bickers, [employer] pool and Teams [place] CMA.    [facility] - Client TELEPHONE ADVICE RECORD AccessNurse Patient Name: [name] [name] Reading Gender: Female DOB: Jun 16, 1934 Age: 87 Y 11 M 11 D Return Phone Number: [phone] (Primary) Address[place]  [address] Client [facility] - Client Client Site [place] Provider Glori Bickers, Roque Lias - MD Contact Type Call Who Is Calling Patient / Member / Family / Caregiver Call Type Triage / Clinical Relationship To Patient Self Return Phone Number [phone] (Primary) Chief Complaint Numbness Reason for Call Symptomatic / Request for Health Information Initial Comment Office transferred PT: Caller says that she has severe pain on her fingers, and when she lays down her entire body gets numb. She has to get up and move around to get over the numbness, but right now her arms and fingers are very painful and it feels like they are going numb. The office made her an appt. for tomorrow. Translation No Nurse Assessment Nurse: Alvis Lemmings, RN, Marcie Bal Date/Time Eilene Ghazi Time): 12/27/2021 10:55:20 AM Confirm and document reason for call. If symptomatic, describe symptoms. ---Office transferred PT: Caller says that she has severe pain to tip of her fingers, and when she lays down her entire body gets numb.(arms and shoulder) She has to get up and move around to get over the numbness, but right now her arms and fingers are very painful and it feels like they are going numb. The office made her an appt. for tomorrow. Has been going on for a yr. It was one arm and now both. Degenerative spine, hx of surgery to [facility], fusion,2013 Does the patient have any new or worsening symptoms? ---Yes Will a triage be completed? ---Yes Related visit to  physician within the last 2 weeks? ---No Does the PT have any chronic conditions? (i.e. diabetes, asthma, this includes High risk factors for pregnancy, etc.) ---Yes List chronic conditions. ---degenerative spine, Lumbar fusion, Fall Thanksgiving, Nov. 2023, Hiatal hernia, going to surgeon about, Is this a behavioral health or substance abuse call? ---No PLEASE NOTE: All timestamps contained within this report are represented as [place] Standard Time. CONFIDENTIALTY NOTICE: This fax transmission is intended only for the addressee. It contains information that is legally privileged, confidential or otherwise protected from use or disclosure. If you are not the intended recipient, you are strictly prohibited from reviewing, disclosing, copying using or disseminating any of this information or taking any action in reliance on or regarding this information. If you have received this fax in error, please notify us immediately by telephone so that we can arrange for its return to [place]. Phone: [phone], Toll-Free: [phone], Fax: [phone] Page: 2 of 2 Call Id: [id number] Guidelines Guideline Title Affirmed Question Affirmed Notes Nurse Date/Time Eilene Ghazi Time) Hand and Wrist Pain Numbness (i.e., loss of sensation) in hand or fingers (Exception: Just tingling; numbness present > 2 weeks.) Etter Sjogren 12/27/2021 11:00:50 AM Disp. Time Eilene Ghazi Time) Disposition Final User 12/27/2021 11:07:08 AM See PCP within 24 Hours Yes Alvis Lemmings RN, Marcie Bal Final Disposition 12/27/2021 11:07:08 AM See PCP within 24 Hours Yes Alvis Lemmings, RN, Lenon Oms Disagree/Comply Comply Caller Understands Yes PreDisposition Call Doctor Care Advice Given Per Guideline SEE PCP WITHIN 24 HOURS: * IF OFFICE WILL BE OPEN: You need to be examined within the next 24 hours. Call  your doctor (or NP/PA) when the office opens and make an appointment. PAIN MEDICINES: * For pain relief, you can take  either acetaminophen, ibuprofen, or naproxen. * They are over-the-counter (OTC) pain drugs. You can buy them at the drugstore. * ACETAMINOPHEN - REGULAR STRENGTH TYLENOL: Take 650 mg (two 325 mg pills) by mouth every 4 to 6 hours as needed. Each Regular Strength Tylenol pill has 325 mg of acetaminophen. The most you should take is 10 pills a day (3,250 mg total). Note: In [place], the maximum is 12 pills a day (3,900 mg total). CARE ADVICE given per Hand and Wrist Pain (Adult) guideline. CALL BACK IF: * You become worse Comments User: Manning Charity, RN Date/Time Eilene Ghazi Time): 12/27/2021 11:02:02 AM Severe arthritis to the right hand, OA. User: Manning Charity, RN Date/Time Eilene Ghazi Time): 12/27/2021 11:12:07 AM Advised to have evaluation for fall? neck injury and was not evaluated after at [facility]. Advised to ue heat to hands, TID, or get heating pad, Has hydrocodone, and advised to try for pain and rest at night. Supportive neck pillow. Massage the areas. appt is 12/28/21 to see Dr. Is with her caregiver now. Referrals REFERRED TO PCP OFFIC

## 2021-12-28 ENCOUNTER — Ambulatory Visit (INDEPENDENT_AMBULATORY_CARE_PROVIDER_SITE_OTHER): Payer: Medicare Other | Admitting: Family Medicine

## 2021-12-28 ENCOUNTER — Encounter: Payer: Self-pay | Admitting: Family Medicine

## 2021-12-28 ENCOUNTER — Ambulatory Visit (INDEPENDENT_AMBULATORY_CARE_PROVIDER_SITE_OTHER)
Admission: RE | Admit: 2021-12-28 | Discharge: 2021-12-28 | Disposition: A | Discharge status: Home / Self Care | Payer: Medicare Other | Source: Ambulatory Visit | Attending: Family Medicine | Admitting: Family Medicine

## 2021-12-28 VITALS — BP 122/68 | HR 77 | Temp 98.0°F | Ht 63.5 in | Wt 196.1 lb

## 2021-12-28 DIAGNOSIS — G8929 Other chronic pain: Secondary | ICD-10-CM | POA: Diagnosis not present

## 2021-12-28 DIAGNOSIS — M25511 Pain in right shoulder: Secondary | ICD-10-CM

## 2021-12-28 DIAGNOSIS — M5136 Other intervertebral disc degeneration, lumbar region: Secondary | ICD-10-CM

## 2021-12-28 DIAGNOSIS — R1319 Other dysphagia: Secondary | ICD-10-CM | POA: Diagnosis not present

## 2021-12-28 DIAGNOSIS — I2510 Atherosclerotic heart disease of native coronary artery without angina pectoris: Secondary | ICD-10-CM | POA: Diagnosis not present

## 2021-12-28 DIAGNOSIS — J9811 Atelectasis: Secondary | ICD-10-CM | POA: Diagnosis not present

## 2021-12-28 DIAGNOSIS — G5601 Carpal tunnel syndrome, right upper limb: Secondary | ICD-10-CM | POA: Diagnosis not present

## 2021-12-28 NOTE — Assessment & Plan Note (Addendum)
This appears to be worse in R hand (she is R dominant)  Pos tinel test  Enc her to go back to using wrist splints at night  May also try voltaren gel on wrists as directed  She already takes gabapendin  Urged her to schedule f/u with her hand specialist/ Dr Fredna Dow  Did review his last note from 2021 regarding this ? If injections or surgery are options in light of worsened symptoms

## 2021-12-28 NOTE — Progress Notes (Signed)
Subjective:    Patient ID: [name] [name], female    DOB: 1934/04/03, 87 y.o.   MRN: [id number]  HPI Pt presents fwith hand /arm pain and numbness  Swallowing   Wt Readings from Last 3 Encounters:  12/28/21 196 lb 2 oz (89 kg)  12/06/21 200 lb (90.7 kg)  11/17/21 199 lb 9.6 oz (90.5 kg)   34.20 kg/m  Numbness and tingling in hands'sharp shooting pains in the fingers  One shoulder  Arms   Saw her hand doctor for this in past- Dr Fredna Dow  Dx with carpal tunnel but was not a good candidate for surgery   Last seen about a year ago     Had EGD and dilation on 11/20 Was dilated  Still having swallowing problems   Referred to a surgeon next wk - [facility] , do discuss [place] None of them medicines-ppi and H2 are not helping    Had a fall at [facility] holiday - off a slippery stool (was in [place])  Got back up and was able to walk  Back was sore   Dr Nelva Bush used to give her hydrocodone for her back (? Then percocet)  Compression fx in past  Also fusion by Dr Carloyn Manner    She lifts light weights every day  Has to walk with cane, this bothers her hand   Patient Active Problem List   Diagnosis Date Noted   Right shoulder pain 12/28/2021   Esophageal dysphagia 12/06/2021   Dark stools 09/29/2021   Current use of proton pump inhibitor 05/12/2021   Palpitation 05/12/2021   Precordial chest pain 05/07/2021   Aortic atherosclerosis ([place]) 05/07/2021   Coronary artery calcification seen on CT scan 05/07/2021   Acute cystitis with hematuria 03/02/2021   Myalgia 12/09/2019   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 12/09/2019   Chronic tension-type headache, not intractable 09/06/2019   Ageusia 09/06/2019   Anosmia 09/06/2019   Trigeminal neuralgia 03/15/2019   Mobility impaired 12/11/2018   Poor balance 08/13/2018   Memory loss 06/27/2018   Physical deconditioning 03/19/2018   Chronic heart failure with preserved ejection fraction ([place]) 02/09/2018   Prediabetes 01/26/2017    Hearing loss 01/19/2016   Carpal tunnel syndrome 08/31/2015   Encounter for Medicare annual wellness exam 06/04/2014   Obesity 06/04/2014   Estrogen deficiency 06/04/2014   Colon cancer screening 06/04/2014   Schatzki's ring 03/27/2014   OA (osteoarthritis) of knee 08/16/2011   Hemorrhoids 08/10/2011   Other dyspnea and respiratory abnormality 06/21/2010   Mixed incontinence 05/27/2010   OVERACTIVE BLADDER 03/12/2010   Lumbar degenerative disc disease 04/13/2009   Chronic GERD 10/15/2008   History of cardiovascular disorder 10/15/2008   Arthropathy, multiple sites 06/26/2008   EXTERNAL HEMORRHOIDS 02/01/2008   HIATAL HERNIA 02/01/2008   DIVERTICULOSIS OF COLON 02/01/2008   GASTRITIS, HX OF 02/01/2008   DYSPNEA 09/19/2007   Vitamin D deficiency 07/03/2007   Osteoporosis of lumbar spine 06/13/2007   Fatigue 53/97/6734   HELICOBACTER PYLORI INFECTION 06/06/2006   Hyperlipidemia 06/06/2006   Generalized anxiety disorder 06/06/2006   Adjustment disorder with mixed anxiety and depressed mood 06/06/2006   MACULAR DEGENERATION 06/06/2006   H/O: CVA (cerebrovascular accident) 06/06/2006   OSTEOARTHRITIS 06/06/2006   Past Medical History:  Diagnosis Date   Alternating constipation and diarrhea    Anxiety state, unspecified    CHF (congestive heart failure) ([place])    Complication of anesthesia    slow to wake up with surgery several years ago  COVID-19 virus infection 12/2018   Depressive disorder, not elsewhere classified    Diaphragmatic hernia without mention of obstruction or gangrene    Difficulty sleeping    Diverticulosis of colon (without mention of hemorrhage)    Esophageal reflux    External hemorrhoids without mention of complication    Family history of adverse reaction to anesthesia    Dad very slow to wake upi   Fatigue    Frequency of urination    Gallstones    Headache    Helicobacter pylori (H. pylori)    Hiatal hernia    History of nuclear stress test     Myoview 05/2021: EF 70, normal perfusion; low risk   History of transfusion    Lumbar spondylosis    Macular degeneration    Macular degeneration (senile) of retina, unspecified    Osteoarthrosis, unspecified whether generalized or localized, unspecified site 08/11/2011   hx. "rhematoid arthritis", osteoarthritis, DDD, bursitis(hip)   Osteoporosis    Palpitations    PONV (postoperative nausea and vomiting)    Schatzki's ring    Shortness of breath 08/11/2011   with exertion only at present   Unspecified cerebral artery occlusion with cerebral infarction 08/11/2011   '97/ '06( TIA)-affected lt. side, slight weakness L side   Unspecified vitamin D deficiency    Past Surgical History:  Procedure Laterality Date   ABDOMINAL HYSTERECTOMY  08-11-11   APPENDECTOMY     BACK SURGERY  08-11-11   '11-hx. lumbar fusion with retained hardware   CATARACT EXTRACTION  08-11-11   Bilateral   ESOPHAGEAL DILATION     ESOPHAGOGASTRODUODENOSCOPY (EGD) WITH PROPOFOL N/A 12/06/2021   Procedure: ESOPHAGOGASTRODUODENOSCOPY (EGD) WITH PROPOFOL;  Surgeon: Daryel November, MD;  Location: [facility];  Service: Gastroenterology;  Laterality: N/A;   KNEE SURGERY  08-11-11   rt. knee scope   PATELLAR TENDON REPAIR Left 02/22/2014   Procedure: PATELLA TENDON REPAIR;  Surgeon: Gearlean Alf, MD;  Location: [facility];  Service: Orthopedics;  Laterality: Left;   PATELLAR TENDON REPAIR Left 06/25/2014   Procedure: LEFT PATELLA TENDON REPAIR;  Surgeon: Gaynelle Arabian, MD;  Location: [facility];  Service: Orthopedics;  Laterality: Left;   SAVORY DILATION N/A 12/06/2021   Procedure: SAVORY DILATION;  Surgeon: Daryel November, MD;  Location: [facility];  Service: Gastroenterology;  Laterality: N/A;   TOTAL KNEE ARTHROPLASTY  08/16/2011   Procedure: TOTAL KNEE ARTHROPLASTY;  Surgeon: Gearlean Alf, MD;  Location: [facility];  Service: Orthopedics;  Laterality: Right;   TOTAL KNEE ARTHROPLASTY Left 01/27/2014   Procedure: LEFT  TOTAL KNEE ARTHROPLASTY;  Surgeon: Gearlean Alf, MD;  Location: [facility];  Service: Orthopedics;  Laterality: Left;   TUBAL LIGATION     Social History   Tobacco Use   Smoking status: Never   Smokeless tobacco: Never  Vaping Use   Vaping Use: Never used  Substance Use Topics   Alcohol use: No    Alcohol/week: 0.0 standard drinks of alcohol   Drug use: No   Family History  Problem Relation Age of Onset   Heart disease Father    Coronary artery disease Brother    Diabetes Brother    Heart disease Brother    Pancreatic cancer Daughter    Diabetes Sister    Colon cancer Neg Hx    Colon polyps Neg Hx    Esophageal cancer Neg Hx    Gallbladder disease Neg Hx    Allergies  Allergen Reactions   Alendronate Sodium  Other (See Comments)    REACTION: JAW PAIN   Celecoxib Other (See Comments)    REACTION: GI UPSET    Chocolate Diarrhea   Omeprazole Nausea Only   Protonix [Pantoprazole Sodium] Nausea And Vomiting   Rabeprazole Nausea And Vomiting   Latex Itching and Rash   Current Outpatient Medications on File Prior to Visit  Medication Sig Dispense Refill   acetaminophen (TYLENOL) 325 MG tablet Take 2 tablets (650 mg total) by mouth every 6 (six) hours as needed for mild pain (or Fever >/= 101). 40 tablet 0   APPLE CIDER VINEGAR PO Take 2 tablets by mouth at bedtime.     atorvastatin (LIPITOR) 10 MG tablet TAKE 1 TABLET DAILY 90 tablet 1   calcium carbonate (TUMS - DOSED IN MG ELEMENTAL CALCIUM) 500 MG chewable tablet Chew 1 tablet (200 mg of elemental calcium total) by mouth 4 (four) times daily as needed for indigestion or heartburn. 10 tablet 0   clopidogrel (PLAVIX) 75 MG tablet Take 1 tablet (75 mg total) by mouth daily. 90 tablet 3   diclofenac sodium (VOLTAREN) 1 % GEL Apply 1 application  topically at bedtime.     estradiol (ESTRACE VAGINAL) 0.1 MG/GM vaginal cream Use a pea sized amount of cream vaginally twice per week (Patient taking differently: Place 1 Applicatorful  vaginally daily as needed.) 42.5 g 0   famotidine (PEPCID) 40 MG tablet Take 1 tablet (40 mg total) by mouth 2 (two) times daily. (Patient taking differently: Take 40 mg by mouth daily.) 60 tablet 5   furosemide (LASIX) 20 MG tablet TAKE 1 TABLET DAILY 90 tablet 2   gabapentin (NEURONTIN) 100 MG capsule TAKE 2 CAPSULES THREE TIMES A DAY (Patient taking differently: Take 100-200 mg by mouth 2 (two) times daily. Take 200 mg in the morning, 100 mg at noon, and 100 mg at bedtime) 540 capsule 1   Liniments (ABSORBINE ARTHRITIS STRENGTH EX) Apply 1 application  topically daily as needed (Back pain).     loperamide (IMODIUM) 2 MG capsule Take 2 mg by mouth as needed for diarrhea or loose stools.     metoprolol succinate (TOPROL-XL) 25 MG 24 hr tablet TAKE ONE-HALF (1/2) TABLET AT BEDTIME 45 tablet 2   Multiple Vitamins-Minerals (PRESERVISION AREDS PO) Take 1 capsule by mouth 2 (two) times daily.     pantoprazole (PROTONIX) 40 MG tablet Take 1 tablet (40 mg total) by mouth 2 (two) times daily. 60 tablet 5   Polyethyl Glycol-Propyl Glycol (SYSTANE OP) Apply 1-2 drops to eye 3 (three) times daily as needed (dry eyes.).      Probiotic Product (UP4 PROBIOTICS WOMENS) CAPS Take one capsule by mouth daily as directed 90 capsule 3   sertraline (ZOLOFT) 100 MG tablet TAKE 2 TABLETS DAILY 180 tablet 3   Turmeric Curcumin 500 MG CAPS Take 1,000 mg by mouth daily.     nitroGLYCERIN (NITROSTAT) 0.4 MG SL tablet Place 1 tablet (0.4 mg total) under the tongue every 5 (five) minutes as needed for chest pain. 25 tablet 3   No current facility-administered medications on file prior to visit.     Review of Systems  Constitutional:  Negative for activity change, appetite change, fatigue, fever and unexpected weight change.  HENT:  Negative for congestion, ear pain, rhinorrhea, sinus pressure and sore throat.   Eyes:  Negative for pain, redness and visual disturbance.  Respiratory:  Negative for cough, shortness of breath  and wheezing.   Cardiovascular:  Negative for chest  pain and palpitations.  Gastrointestinal:  Negative for abdominal pain, blood in stool, constipation and diarrhea.  Endocrine: Negative for polydipsia and polyuria.  Genitourinary:  Negative for dysuria, frequency and urgency.  Musculoskeletal:  Positive for arthralgias, back pain and gait problem. Negative for joint swelling and myalgias.  Skin:  Negative for pallor and rash.  Allergic/Immunologic: Negative for environmental allergies.  Neurological:  Negative for dizziness, syncope and headaches.  Hematological:  Negative for adenopathy. Does not bruise/bleed easily.  Psychiatric/Behavioral:  Negative for decreased concentration and dysphoric mood. The patient is not nervous/anxious.        Objective:   Physical Exam Constitutional:      General: She is not in acute distress.    Appearance: Normal appearance. She is well-developed. She is obese. She is not ill-appearing or diaphoretic.  HENT:     Head: Normocephalic and atraumatic.  Eyes:     General:        Right eye: No discharge.        Left eye: No discharge.     Conjunctiva/sclera: Conjunctivae normal.     Pupils: Pupils are equal, round, and reactive to light.  Neck:     Thyroid: No thyromegaly.     Vascular: No carotid bruit or JVD.  Cardiovascular:     Rate and Rhythm: Normal rate and regular rhythm.     Heart sounds: Normal heart sounds.     No gallop.  Pulmonary:     Effort: Pulmonary effort is normal. No respiratory distress.     Breath sounds: Normal breath sounds. No wheezing or rales.  Abdominal:     General: There is no distension or abdominal bruit.     Palpations: Abdomen is soft.     Tenderness: There is no abdominal tenderness.  Musculoskeletal:     Right shoulder: Tenderness and bony tenderness present. No swelling, deformity, effusion or crepitus. Decreased range of motion.     Cervical back: Normal range of motion and neck supple.     Right lower  leg: No edema.     Left lower leg: No edema.     Comments: Mild kyphosis   Limited rom of R shoulder  Tender over acromion and superiorly  Pain with Hawking's test  Pain to abduct over 90 degrees   Finger deformities of OA/distal worse on R Grip is limited by pain     Lymphadenopathy:     Cervical: No cervical adenopathy.  Skin:    General: Skin is warm and dry.     Coloration: Skin is not pale.     Findings: No rash.  Neurological:     Mental Status: She is alert.     Coordination: Coordination normal.     Deep Tendon Reflexes: Reflexes are normal and symmetric. Reflexes normal.     Comments: Positive tinel and phalen test on R   L wrist is not tender and neg tinel   Grip is limited by pain   Psychiatric:        Mood and Affect: Mood is anxious.     Comments: Mildly anxious Pleasant  In pain today making it hard for her to concentrate            Assessment & Plan:   Problem List Items Addressed This Visit       Digestive   Esophageal dysphagia    Notes from GI and recent EGD reviewed today  Had EGD with dilatation Noted HH and tortuous esoph  She was  ref to gen surg (next wk) for the [facility]  Still symptomatic  Recommended she eat with small bites slowly and avoid foods that stick She also continues ppi and H2 blocker         Nervous and Auditory   Carpal tunnel syndrome - Primary    This appears to be worse in R hand (she is R dominant)  Pos tinel test  Enc her to go back to using wrist splints at night  May also try voltaren gel on wrists as directed  She already takes gabapendin  Urged her to schedule f/u with her hand specialist/ Dr Fredna Dow  Did review his last note from 2021 regarding this ? If injections or surgery are options in light of worsened symptoms         Musculoskeletal and Integument   Lumbar degenerative disc disease    Has seen Dr Nelva Bush before for this  Also h/o compression fracture in the past  Took hydrocodone and poss percocet  years ago        Other   Right shoulder pain    Chronic but worse lately  Had a fall last mo and used that arm to get up /worse since then Based on h/o OA I suspect that may be a factor Limited rom today and exam consistent with rotator cuff involvement  Enc her to use voltaren gel  Ice as well  Pend report for advisement       Relevant Orders   DG Shoulder Right

## 2021-12-28 NOTE — Assessment & Plan Note (Signed)
Chronic but worse lately  Had a fall last mo and used that arm to get up /worse since then Based on h/o OA I suspect that may be a factor Limited rom today and exam consistent with rotator cuff involvement  Enc her to use voltaren gel  Ice as well  Pend report for advisement

## 2021-12-28 NOTE — Patient Instructions (Signed)
[name] today  We will call you with a reading tomorrow or Thursday   Use ice on it   Use voltaren gel on your shoulder and your wrists (for the carpal tunnel)  Get out your wrist splints for both wrists  Wear them at night in bed  I hope this will help calm symptoms down   I did your handicapped parking form   Call and schedule an appt with Dr Fredna Dow  Let me know if you need a referral  See the surgeon about the swallowing as planned Eat small bites slowly

## 2021-12-28 NOTE — Assessment & Plan Note (Addendum)
Notes from GI and recent EGD reviewed today  Had EGD with dilatation Noted HH and tortuous esoph  She was ref to gen surg (next wk) for the [facility]  Still symptomatic  Recommended she eat with small bites slowly and avoid foods that stick She also continues ppi and H2 blocker

## 2021-12-28 NOTE — Assessment & Plan Note (Signed)
Has seen Dr Nelva Bush before for this  Also h/o compression fracture in the past  Took hydrocodone and poss percocet years ago

## 2021-12-29 ENCOUNTER — Other Ambulatory Visit: Payer: Self-pay | Admitting: Family Medicine

## 2021-12-29 ENCOUNTER — Encounter: Payer: Self-pay | Admitting: Family Medicine

## 2021-12-29 DIAGNOSIS — J9811 Atelectasis: Secondary | ICD-10-CM | POA: Insufficient documentation

## 2021-12-29 NOTE — Addendum Note (Signed)
Addended by: Loura Pardon A on: 12/29/2021 11:05 AM   Modules accepted: Orders

## 2021-12-29 NOTE — Telephone Encounter (Signed)
Last filled on 07/22/21 #540 caps with 1 refill (mail order pharmacy), last OV was yesterday

## 2021-12-29 NOTE — Telephone Encounter (Signed)
See mychart message pt will come on 12/18, please put xray order in

## 2021-12-29 NOTE — Telephone Encounter (Signed)
?   Not due until early January? Jan 6th?

## 2021-12-30 DIAGNOSIS — R1310 Dysphagia, unspecified: Secondary | ICD-10-CM | POA: Diagnosis not present

## 2021-12-30 DIAGNOSIS — I5032 Chronic diastolic (congestive) heart failure: Secondary | ICD-10-CM | POA: Diagnosis not present

## 2021-12-30 DIAGNOSIS — Z7902 Long term (current) use of antithrombotics/antiplatelets: Secondary | ICD-10-CM | POA: Diagnosis not present

## 2021-12-30 DIAGNOSIS — K449 Diaphragmatic hernia without obstruction or gangrene: Secondary | ICD-10-CM | POA: Diagnosis not present

## 2021-12-30 DIAGNOSIS — R1313 Dysphagia, pharyngeal phase: Secondary | ICD-10-CM | POA: Diagnosis not present

## 2021-12-30 DIAGNOSIS — I2510 Atherosclerotic heart disease of native coronary artery without angina pectoris: Secondary | ICD-10-CM | POA: Diagnosis not present

## 2021-12-30 DIAGNOSIS — R12 Heartburn: Secondary | ICD-10-CM | POA: Diagnosis not present

## 2021-12-30 DIAGNOSIS — Z8673 Personal history of transient ischemic attack (TIA), and cerebral infarction without residual deficits: Secondary | ICD-10-CM | POA: Diagnosis not present

## 2021-12-30 NOTE — Telephone Encounter (Signed)
It's going to mail order and due to mail being delayed and holidays coming up they requested it early to make sure pt gets meds on time.

## 2021-12-31 DIAGNOSIS — G5601 Carpal tunnel syndrome, right upper limb: Secondary | ICD-10-CM | POA: Diagnosis not present

## 2022-01-03 ENCOUNTER — Ambulatory Visit (INDEPENDENT_AMBULATORY_CARE_PROVIDER_SITE_OTHER)
Admission: RE | Admit: 2022-01-03 | Discharge: 2022-01-03 | Disposition: A | Discharge status: Home / Self Care | Payer: Medicare Other | Source: Ambulatory Visit | Attending: Family Medicine | Admitting: Family Medicine

## 2022-01-03 ENCOUNTER — Other Ambulatory Visit: Payer: Self-pay

## 2022-01-03 ENCOUNTER — Telehealth: Payer: Self-pay

## 2022-01-03 DIAGNOSIS — J9811 Atelectasis: Secondary | ICD-10-CM

## 2022-01-03 DIAGNOSIS — R1319 Other dysphagia: Secondary | ICD-10-CM

## 2022-01-03 DIAGNOSIS — S22000A Wedge compression fracture of unspecified thoracic vertebra, initial encounter for closed fracture: Secondary | ICD-10-CM | POA: Diagnosis not present

## 2022-01-03 NOTE — Telephone Encounter (Signed)
Pt scheduled for EM at [facility] 05/25/22 at 12:30pm.  Message sent to [facility] to add pt to hospital list for repeat EGD and bravo placement at the hospital. Amb ref in epic for EM and pt sent instructions via mychart.

## 2022-01-03 NOTE — Telephone Encounter (Signed)
-----   Message from Daryel November, MD sent at 12/30/2021  5:03 PM EST ----- Regarding: FW: hiatal hernia/dysphagia Vaughan Basta,  Can you please get Ms. Labreck set up for esophageal manometry and on the list for a repeat EGD with Bravo probe placement in the hospital?    Thanks ----- Message ----- From: Greer Pickerel, MD Sent: 12/30/2021   2:16 PM EST To: Merideth Abbey; Daryel November, MD Subject: hiatal hernia/dysphagia                        Hi Scott  Saw this nice lady earlier this am.  Thanks for the referral. Agree her dysphagia likely multifactorial.  The CP bar is a concern.  I thinks she needs manometry and Bravo ph to help with surgical planning.  As of now I don't know what type of wrap to do or whether or not one should be done at the time of hiatal hernia repair.   Can your office arrange manometry and Bravo?  Thanks Randall Hiss [phone]  Leighton Ruff. Redmond Pulling, MD, FACS General, Bariatric, & Minimally Invasive Surgery [facility],  [place]

## 2022-01-06 ENCOUNTER — Other Ambulatory Visit (HOSPITAL_COMMUNITY): Payer: Self-pay | Admitting: *Deleted

## 2022-01-06 DIAGNOSIS — R1310 Dysphagia, unspecified: Secondary | ICD-10-CM

## 2022-01-06 DIAGNOSIS — H353132 Nonexudative age-related macular degeneration, bilateral, intermediate dry stage: Secondary | ICD-10-CM | POA: Diagnosis not present

## 2022-01-06 DIAGNOSIS — R059 Cough, unspecified: Secondary | ICD-10-CM

## 2022-01-28 ENCOUNTER — Ambulatory Visit (HOSPITAL_COMMUNITY)
Admission: RE | Admit: 2022-01-28 | Discharge: 2022-01-28 | Disposition: A | Discharge status: Home / Self Care | Payer: Medicare Other | Source: Ambulatory Visit | Attending: Family Medicine | Admitting: Family Medicine

## 2022-01-28 DIAGNOSIS — I509 Heart failure, unspecified: Secondary | ICD-10-CM | POA: Insufficient documentation

## 2022-01-28 DIAGNOSIS — M549 Dorsalgia, unspecified: Secondary | ICD-10-CM | POA: Diagnosis not present

## 2022-01-28 DIAGNOSIS — Z8673 Personal history of transient ischemic attack (TIA), and cerebral infarction without residual deficits: Secondary | ICD-10-CM | POA: Insufficient documentation

## 2022-01-28 DIAGNOSIS — R059 Cough, unspecified: Secondary | ICD-10-CM

## 2022-01-28 DIAGNOSIS — Z9049 Acquired absence of other specified parts of digestive tract: Secondary | ICD-10-CM | POA: Insufficient documentation

## 2022-01-28 DIAGNOSIS — R0989 Other specified symptoms and signs involving the circulatory and respiratory systems: Secondary | ICD-10-CM | POA: Diagnosis not present

## 2022-01-28 DIAGNOSIS — K219 Gastro-esophageal reflux disease without esophagitis: Secondary | ICD-10-CM | POA: Insufficient documentation

## 2022-01-28 DIAGNOSIS — Z90710 Acquired absence of both cervix and uterus: Secondary | ICD-10-CM | POA: Insufficient documentation

## 2022-01-28 DIAGNOSIS — R1310 Dysphagia, unspecified: Secondary | ICD-10-CM

## 2022-01-28 DIAGNOSIS — Z8719 Personal history of other diseases of the digestive system: Secondary | ICD-10-CM | POA: Diagnosis not present

## 2022-01-28 DIAGNOSIS — Z8616 Personal history of COVID-19: Secondary | ICD-10-CM | POA: Diagnosis not present

## 2022-02-05 DIAGNOSIS — H353132 Nonexudative age-related macular degeneration, bilateral, intermediate dry stage: Secondary | ICD-10-CM | POA: Diagnosis not present

## 2022-02-16 ENCOUNTER — Other Ambulatory Visit: Payer: Self-pay | Admitting: Orthopedic Surgery

## 2022-02-16 DIAGNOSIS — M65331 Trigger finger, right middle finger: Secondary | ICD-10-CM | POA: Diagnosis not present

## 2022-02-16 DIAGNOSIS — G5601 Carpal tunnel syndrome, right upper limb: Secondary | ICD-10-CM | POA: Diagnosis not present

## 2022-02-28 ENCOUNTER — Emergency Department (HOSPITAL_COMMUNITY): Payer: Medicare Other

## 2022-02-28 ENCOUNTER — Emergency Department (HOSPITAL_COMMUNITY)
Admission: EM | Admit: 2022-02-28 | Discharge: 2022-02-28 | Disposition: A | Discharge status: Home / Self Care | Payer: Medicare Other | Attending: Emergency Medicine | Admitting: Emergency Medicine

## 2022-02-28 DIAGNOSIS — Z7902 Long term (current) use of antithrombotics/antiplatelets: Secondary | ICD-10-CM | POA: Insufficient documentation

## 2022-02-28 DIAGNOSIS — M25552 Pain in left hip: Secondary | ICD-10-CM | POA: Diagnosis not present

## 2022-02-28 DIAGNOSIS — M79606 Pain in leg, unspecified: Secondary | ICD-10-CM | POA: Diagnosis not present

## 2022-02-28 DIAGNOSIS — R0789 Other chest pain: Secondary | ICD-10-CM

## 2022-02-28 DIAGNOSIS — I509 Heart failure, unspecified: Secondary | ICD-10-CM | POA: Insufficient documentation

## 2022-02-28 DIAGNOSIS — W19XXXA Unspecified fall, initial encounter: Secondary | ICD-10-CM

## 2022-02-28 DIAGNOSIS — Z91040 Latex allergy status: Secondary | ICD-10-CM | POA: Insufficient documentation

## 2022-02-28 DIAGNOSIS — R079 Chest pain, unspecified: Secondary | ICD-10-CM | POA: Diagnosis not present

## 2022-02-28 DIAGNOSIS — M79672 Pain in left foot: Secondary | ICD-10-CM | POA: Diagnosis not present

## 2022-02-28 DIAGNOSIS — M19072 Primary osteoarthritis, left ankle and foot: Secondary | ICD-10-CM | POA: Diagnosis not present

## 2022-02-28 DIAGNOSIS — R072 Precordial pain: Secondary | ICD-10-CM | POA: Diagnosis not present

## 2022-02-28 DIAGNOSIS — S99922A Unspecified injury of left foot, initial encounter: Secondary | ICD-10-CM | POA: Diagnosis not present

## 2022-02-28 DIAGNOSIS — R9431 Abnormal electrocardiogram [ECG] [EKG]: Secondary | ICD-10-CM | POA: Diagnosis not present

## 2022-02-28 MED ORDER — CYCLOBENZAPRINE HCL 10 MG PO TABS: 10.0000 mg | ORAL_TABLET | Freq: Two times a day (BID) | ORAL | 0 refills | Status: DC | PRN

## 2022-02-28 MED ORDER — ACETAMINOPHEN 500 MG PO TABS: 1000.0000 mg | ORAL_TABLET | Freq: Once | ORAL | Status: DC

## 2022-02-28 NOTE — ED Provider Triage Note (Signed)
Emergency Medicine Provider Triage Evaluation Note  [name] [name] , a 88 y.o. female  was evaluated in triage.  Pt complains of left hip and foot pain after a mechanical fall.  Patient tripped over her oxygen cord.  Patient was helped up and heard a pop in her sternum.  Admits to significant sternal pain.  No head injury.  Patient currently on Plavix.  Review of Systems  Positive: arthralgia Negative: headache  Physical Exam  BP (!) 134/94 (BP Location: Left Arm)   Pulse 60   Temp 97.7 F (36.5 C) (Oral)   Resp 18   SpO2 100%  Gen:   Awake, no distress   Resp:  Normal effort  MSK:   Moves extremities without difficulty  Other:    Medical Decision Making  Medically screening exam initiated at 3:38 PM.  Appropriate orders placed.  Rahni Maillard Gotto was informed that the remainder of the evaluation will be completed by another provider, this initial triage assessment does not replace that evaluation, and the importance of remaining in the ED until their evaluation is complete.  X-rays   Suzy Bouchard, PA-C 02/28/22 1539

## 2022-02-28 NOTE — ED Provider Notes (Signed)
[facility] Provider Note   CSN: [id number] Arrival date & time: 02/28/22  1445     History  Chief Complaint  Patient presents with   [name] [name] is a 88 y.o. female presenting today for evaluation after fall.  Patient complains of left hip and foot pain after mechanical fall when she tripped over an oxygen cord.  Patient reports she was helped standing up however she heard a pop across the chest.  She denies hitting her head or LOC.  She is currently on Plavix.  No headache, nausea, vomiting.   Fall      Past Medical History:  Diagnosis Date   Alternating constipation and diarrhea    Anxiety state, unspecified    CHF (congestive heart failure) (HCC)    Complication of anesthesia    slow to wake up with surgery several years ago    COVID-19 virus infection 12/2018   Depressive disorder, not elsewhere classified    Diaphragmatic hernia without mention of obstruction or gangrene    Difficulty sleeping    Diverticulosis of colon (without mention of hemorrhage)    Esophageal reflux    External hemorrhoids without mention of complication    Family history of adverse reaction to anesthesia    Dad very slow to wake upi   Fatigue    Frequency of urination    Gallstones    Headache    Helicobacter pylori (H. pylori)    Hiatal hernia    History of nuclear stress test    Myoview 05/2021: EF 70, normal perfusion; low risk   History of transfusion    Lumbar spondylosis    Macular degeneration    Macular degeneration (senile) of retina, unspecified    Osteoarthrosis, unspecified whether generalized or localized, unspecified site 08/11/2011   hx. "rhematoid arthritis", osteoarthritis, DDD, bursitis(hip)   Osteoporosis    Palpitations    PONV (postoperative nausea and vomiting)    Schatzki's ring    Shortness of breath 08/11/2011   with exertion only at present   Unspecified cerebral artery occlusion with cerebral  infarction 08/11/2011   '97/ '06( TIA)-affected lt. side, slight weakness L side   Unspecified vitamin D deficiency    Past Surgical History:  Procedure Laterality Date   ABDOMINAL HYSTERECTOMY  08-11-11   APPENDECTOMY     BACK SURGERY  08-11-11   '11-hx. lumbar fusion with retained hardware   CATARACT EXTRACTION  08-11-11   Bilateral   ESOPHAGEAL DILATION     ESOPHAGOGASTRODUODENOSCOPY (EGD) WITH PROPOFOL N/A 12/06/2021   Procedure: ESOPHAGOGASTRODUODENOSCOPY (EGD) WITH PROPOFOL;  Surgeon: Daryel November, MD;  Location: [facility];  Service: Gastroenterology;  Laterality: N/A;   KNEE SURGERY  08-11-11   rt. knee scope   PATELLAR TENDON REPAIR Left 02/22/2014   Procedure: PATELLA TENDON REPAIR;  Surgeon: Gearlean Alf, MD;  Location: [facility];  Service: Orthopedics;  Laterality: Left;   PATELLAR TENDON REPAIR Left 06/25/2014   Procedure: LEFT PATELLA TENDON REPAIR;  Surgeon: Gaynelle Arabian, MD;  Location: [facility];  Service: Orthopedics;  Laterality: Left;   SAVORY DILATION N/A 12/06/2021   Procedure: SAVORY DILATION;  Surgeon: Daryel November, MD;  Location: [facility];  Service: Gastroenterology;  Laterality: N/A;   TOTAL KNEE ARTHROPLASTY  08/16/2011   Procedure: TOTAL KNEE ARTHROPLASTY;  Surgeon: Gearlean Alf, MD;  Location: [facility];  Service: Orthopedics;  Laterality: Right;   TOTAL KNEE ARTHROPLASTY Left 01/27/2014  Procedure: LEFT TOTAL KNEE ARTHROPLASTY;  Surgeon: Gearlean Alf, MD;  Location: [facility];  Service: Orthopedics;  Laterality: Left;   TUBAL LIGATION       Home Medications Prior to Admission medications   Medication Sig Start Date End Date Taking? Authorizing Provider  cyclobenzaprine (FLEXERIL) 10 MG tablet Take 1 tablet (10 mg total) by mouth 2 (two) times daily as needed for muscle spasms. 02/28/22  Yes Rex Kras, PA  acetaminophen (TYLENOL) 325 MG tablet Take 2 tablets (650 mg total) by mouth every 6 (six) hours as needed for mild pain (or Fever >/= 101).  01/29/14   Perkins, Alexzandrew L, PA-C  APPLE CIDER VINEGAR PO Take 2 tablets by mouth at bedtime.    [provider]  atorvastatin (LIPITOR) 10 MG tablet TAKE 1 TABLET DAILY 12/29/21   Tower, Wynelle Fanny, MD  calcium carbonate (TUMS - DOSED IN MG ELEMENTAL CALCIUM) 500 MG chewable tablet Chew 1 tablet (200 mg of elemental calcium total) by mouth 4 (four) times daily as needed for indigestion or heartburn. 02/25/14   Perkins, Alexzandrew L, PA-C  clopidogrel (PLAVIX) 75 MG tablet Take 1 tablet (75 mg total) by mouth daily. 12/07/21   Daryel November, MD  diclofenac sodium (VOLTAREN) 1 % GEL Apply 1 application  topically at bedtime. 12/22/15   [provider]  estradiol (ESTRACE VAGINAL) 0.1 MG/GM vaginal cream Use a pea sized amount of cream vaginally twice per week Patient taking differently: Place 1 Applicatorful vaginally daily as needed. 07/07/21   Tower, Wynelle Fanny, MD  famotidine (PEPCID) 40 MG tablet Take 1 tablet (40 mg total) by mouth 2 (two) times daily. Patient taking differently: Take 40 mg by mouth daily. 10/26/21   Levin Erp, PA  furosemide (LASIX) 20 MG tablet TAKE 1 TABLET DAILY 10/12/21   Sherren Mocha, MD  gabapentin (NEURONTIN) 100 MG capsule TAKE 2 CAPSULES THREE TIMES A DAY 12/30/21   Tower, Wynelle Fanny, MD  Liniments (ABSORBINE ARTHRITIS STRENGTH EX) Apply 1 application  topically daily as needed (Back pain).    [provider]  loperamide (IMODIUM) 2 MG capsule Take 2 mg by mouth as needed for diarrhea or loose stools.    [provider]  metoprolol succinate (TOPROL-XL) 25 MG 24 hr tablet TAKE ONE-HALF (1/2) TABLET AT BEDTIME 08/17/21   Weaver, Scott T, PA-C  Multiple Vitamins-Minerals (PRESERVISION AREDS PO) Take 1 capsule by mouth 2 (two) times daily.    [provider]  nitroGLYCERIN (NITROSTAT) 0.4 MG SL tablet Place 1 tablet (0.4 mg total) under the tongue every 5 (five) minutes as needed for chest pain. 05/07/21 12/01/21   [name] Dopp T, PA-C  pantoprazole (PROTONIX) 40 MG tablet Take 1 tablet (40 mg total) by mouth 2 (two) times daily. 10/26/21   Levin Erp, PA  Polyethyl Glycol-Propyl Glycol (SYSTANE OP) Apply 1-2 drops to eye 3 (three) times daily as needed (dry eyes.).     [provider]  Probiotic Product (UP4 PROBIOTICS WOMENS) CAPS Take one capsule by mouth daily as directed 12/14/21   Tower, Wynelle Fanny, MD  sertraline (ZOLOFT) 100 MG tablet TAKE 2 TABLETS DAILY 04/02/21   Tower, Wynelle Fanny, MD  Turmeric Curcumin 500 MG CAPS Take 1,000 mg by mouth daily.    [provider]      Allergies    Alendronate sodium, Celecoxib, Chocolate, Omeprazole, Protonix [pantoprazole sodium], Rabeprazole, Latex, and Sulfa antibiotics    Review of Systems   Review of Systems  Negative except as per HPI.  Physical Exam Updated Vital Signs BP (!) 134/94 (BP Location: Left Arm)   Pulse 60   Temp 97.7 F (36.5 C) (Oral)   Resp 18   SpO2 100%  Physical Exam Vitals and nursing note reviewed.  Constitutional:      Appearance: Normal appearance.  HENT:     Head: Normocephalic and atraumatic.     Mouth/Throat:     Mouth: Mucous membranes are moist.  Eyes:     General: No scleral icterus. Cardiovascular:     Rate and Rhythm: Normal rate and regular rhythm.     Pulses: Normal pulses.     Heart sounds: Normal heart sounds.  Pulmonary:     Effort: Pulmonary effort is normal.     Breath sounds: Normal breath sounds.  Abdominal:     General: Abdomen is flat.     Palpations: Abdomen is soft.     Tenderness: There is no abdominal tenderness.  Musculoskeletal:        General: No deformity.  Skin:    General: Skin is warm.     Findings: No rash.  Neurological:     General: No focal deficit present.     Mental Status: She is alert. Mental status is at baseline.     Cranial Nerves: No cranial nerve deficit.     Sensory: No sensory deficit.     Motor: No weakness.     Coordination:  Coordination normal.     Gait: Gait normal.     Deep Tendon Reflexes: Reflexes normal.  Psychiatric:        Mood and Affect: Mood normal.     ED Results / Procedures / Treatments   Labs (all labs ordered are listed, but only abnormal results are displayed) Labs Reviewed - No data to display  EKG EKG Interpretation  Date/Time:  Monday February 28 2022 16:06:36 EST Ventricular Rate:  57 PR Interval:  155 QRS Duration: 98 QT Interval:  459 QTC Calculation: 447 R Axis:   100 Text Interpretation: Sinus rhythm Probable right ventricular hypertrophy Borderline T abnormalities, inferior leads Confirmed by Sherwood Gambler [id number]) on 02/28/2022 5:08:10 PM  Radiology DG Chest 1 View  Result Date: 02/28/2022 CLINICAL DATA:  Mechanical fall.  Sternal pain. EXAM: CHEST  1 VIEW COMPARISON:  Chest radiograph 01/03/2022. FINDINGS: The cardiomediastinal silhouette is normal There is no focal consolidation or pulmonary edema. There is no pleural effusion or pneumothorax. No displaced rib fracture is seen. The sternum is not assessed on this AP radiograph. IMPRESSION: 1. No radiographic evidence of acute cardiopulmonary process. 2. No displaced rib fracture identified. The sternum is not assessed on this single AP radiograph. Electronically Signed   By: Valetta Mole M.D.   On: 02/28/2022 16:07   DG Foot Complete Left  Result Date: 02/28/2022 CLINICAL DATA:  Pain after injury EXAM: LEFT FOOT - COMPLETE 3 VIEW COMPARISON:  None Available. FINDINGS: Osteopenia. No acute fracture or dislocation. Mild degenerative changes noted along the interphalangeal joints, second through fourth digit in particular as well as at the first metatarsophalangeal joint. IMPRESSION: 1. No acute fracture or dislocation. 2. Mild degenerative changes. Electronically Signed   By: Jill Side M.D.   On: 02/28/2022 16:06   DG Hip Unilat W or Wo Pelvis 2-3 Views Left  Result Date: 02/28/2022 CLINICAL DATA:  Left hip pain after a  fall. EXAM: DG HIP (WITH OR WITHOUT PELVIS) 2-3V LEFT COMPARISON:  Hip radiographs 07/21/2009, CT abdomen/pelvis 09/24/2021 FINDINGS:  There is no evidence of acute fracture or dislocation. Femoroacetabular alignment is normal bilaterally. There is mild degenerative change about the hips. The SI joints and symphysis pubis are intact. Lower lumbar spine fusion hardware is noted. The soft tissues are unremarkable. IMPRESSION: No acute fracture or dislocation. Electronically Signed   By: Valetta Mole M.D.   On: 02/28/2022 16:04    Procedures Procedures    Medications Ordered in ED Medications  acetaminophen (TYLENOL) tablet 1,000 mg (has no administration in time range)    ED Course/ Medical Decision Making/ A&P                             Medical Decision Making Risk OTC drugs. Prescription drug management.   This patient presents to the ED for evaluation after a fall, this involves an extensive number of treatment options, and is a complaint that carries with a high risk of complications and morbidity.  The differential diagnosis includes bone fracture, dislocation, ICH.  This is not an exhaustive list.  Imaging studies: I ordered imaging studies. I personally reviewed, interpreted imaging and agree with the radiologist's interpretations. The results include: Chest x-ray, foot x-ray and hip x-ray all negative.  Problem list/ ED course/ Critical interventions/ Medical management: HPI: See above Vital signs within normal range and stable throughout visit. Laboratory/imaging studies significant for: See above. On physical examination, patient is afebrile and appears in no acute distress.  Based on patient's clinical presentations and laboratory/imaging studies I suspect musculoskeletal pain.  Tylenol ordered for pain. Advised patient to take Tylenol/ibuprofen/naproxen for pain, follow-up with primary care physician for further evaluation and management, return to the ER if new or worsening  symptoms.  I have reviewed the patient home medicines and have made adjustments as needed.  Cardiac monitoring/EKG: The patient was maintained on a cardiac monitor.  I personally reviewed and interpreted the cardiac monitor which showed an underlying rhythm of: sinus rhythm.  Additional history obtained: External records from outside source obtained and reviewed including: Chart review including previous notes, labs, imaging.  Disposition Continued outpatient therapy. Follow-up with PCP recommended for reevaluation of symptoms. Treatment plan discussed with patient.  Pt acknowledged understanding was agreeable to the plan. Worrisome signs and symptoms were discussed with patient, and patient acknowledged understanding to return to the ED if they noticed these signs and symptoms. Patient was stable upon discharge.   This chart was dictated using voice recognition software.  Despite best efforts to proofread,  errors can occur which can change the documentation meaning.          Final Clinical Impression(s) / ED Diagnoses Final diagnoses:  Fall, initial encounter  Pain of left hip  Left foot pain  Pain of sternum    Rx / DC Orders ED Discharge Orders          Ordered    cyclobenzaprine (FLEXERIL) 10 MG tablet  2 times daily PRN        02/28/22 1744              Rex Kras, PA 03/01/22 1052    Sherwood Gambler, MD 03/04/22 [phone]

## 2022-02-28 NOTE — ED Triage Notes (Signed)
BIB EMS mechanical fall after tripping over o2 cord, complains of left hip and left ankle pain. When son went to pick her up off the floor pt heard and felt a pop in sternum. Sternum pain is worse than hip and ankle at this time.  No LOC, did not hit head. Pt is on blood thinners.   A&O x 4  128/66 HR 60 98% RA RR 20  CBG 136

## 2022-02-28 NOTE — Discharge Instructions (Addendum)
Please take your medications as prescribed. Take tylenol/ibuprofen and flexeril for pain. I recommend close follow-up with PCP for reevaluation.  Please do not hesitate to return to emergency department if worrisome signs symptoms we discussed become apparent.

## 2022-03-07 DIAGNOSIS — L820 Inflamed seborrheic keratosis: Secondary | ICD-10-CM | POA: Diagnosis not present

## 2022-03-07 DIAGNOSIS — L821 Other seborrheic keratosis: Secondary | ICD-10-CM | POA: Diagnosis not present

## 2022-03-07 DIAGNOSIS — Z85828 Personal history of other malignant neoplasm of skin: Secondary | ICD-10-CM | POA: Diagnosis not present

## 2022-03-07 DIAGNOSIS — H353132 Nonexudative age-related macular degeneration, bilateral, intermediate dry stage: Secondary | ICD-10-CM | POA: Diagnosis not present

## 2022-03-14 ENCOUNTER — Other Ambulatory Visit: Payer: Self-pay | Admitting: Family Medicine

## 2022-03-14 NOTE — Telephone Encounter (Signed)
Called patient to verify she needs. Patient states that she does use about 2 times a week and is helpful for her vaginal symptoms. Would like refill as requested.

## 2022-04-06 DIAGNOSIS — H353132 Nonexudative age-related macular degeneration, bilateral, intermediate dry stage: Secondary | ICD-10-CM | POA: Diagnosis not present

## 2022-04-13 ENCOUNTER — Telehealth: Payer: Self-pay | Admitting: Cardiovascular Disease

## 2022-04-13 ENCOUNTER — Telehealth: Payer: Self-pay | Admitting: *Deleted

## 2022-04-13 NOTE — Telephone Encounter (Signed)
Pt has been scheduled for a tele visit, 04/21/22 2:00.  Consent on file / medications reconciled.    Patient Consent for Virtual Visit        [name] [name] has provided verbal consent on 04/13/2022 for a virtual visit (video or telephone).   CONSENT FOR VIRTUAL VISIT FOR:  [name] [name]  By participating in this virtual visit I agree to the following:  I hereby voluntarily request, consent and authorize [facility] and its employed or contracted physicians, physician assistants, nurse practitioners or other licensed health care professionals (the Practitioner), to provide me with telemedicine health care services (the "Services") as deemed necessary by the treating Practitioner. I acknowledge and consent to receive the Services by the Practitioner via telemedicine. I understand that the telemedicine visit will involve communicating with the Practitioner through live audiovisual communication technology and the disclosure of certain medical information by electronic transmission. I acknowledge that I have been given the opportunity to request an in-person assessment or other available alternative prior to the telemedicine visit and am voluntarily participating in the telemedicine visit.  I understand that I have the right to withhold or withdraw my consent to the use of telemedicine in the course of my care at any time, without affecting my right to future care or treatment, and that the Practitioner or I may terminate the telemedicine visit at any time. I understand that I have the right to inspect all information obtained and/or recorded in the course of the telemedicine visit and may receive copies of available information for a reasonable fee.  I understand that some of the potential risks of receiving the Services via telemedicine include:  Delay or interruption in medical evaluation due to technological equipment failure or disruption; Information transmitted may not be  sufficient (e.g. poor resolution of images) to allow for appropriate medical decision making by the Practitioner; and/or  In rare instances, security protocols could fail, causing a breach of personal health information.  Furthermore, I acknowledge that it is my responsibility to provide information about my medical history, conditions and care that is complete and accurate to the best of my ability. I acknowledge that Practitioner's advice, recommendations, and/or decision may be based on factors not within their control, such as incomplete or inaccurate data provided by me or distortions of diagnostic images or specimens that may result from electronic transmissions. I understand that the practice of medicine is not an exact science and that Practitioner makes no warranties or guarantees regarding treatment outcomes. I acknowledge that a copy of this consent can be made available to me via my patient portal ([facility]), or I can request a printed copy by calling the office of [facility].    I understand that my insurance will be billed for this visit.   I have read or had this consent read to me. I understand the contents of this consent, which adequately explains the benefits and risks of the Services being provided via telemedicine.  I have been provided ample opportunity to ask questions regarding this consent and the Services and have had my questions answered to my satisfaction. I give my informed consent for the services to be provided through the use of telemedicine in my medical care

## 2022-04-13 NOTE — Telephone Encounter (Signed)
Primary Delmar, MD   Preoperative team, please contact this patient and set up a phone call appointment for further preoperative risk assessment. Please obtain consent and complete medication review. Thank you for your help.   From a cardiac perspective, she may hold Plavix for 5 days prior to procedure. Would recommend that provider ask about TIA/stroke history of if she is taking for any other reason.  Emmaline Life, NP-C  04/13/2022, 3:56 PM [address], [address] Office [phone] Fax [phone]

## 2022-04-13 NOTE — Telephone Encounter (Signed)
Pt has been scheduled for a tele visit, 04/21/22 2:00.  Consent on file / medications reconciled.

## 2022-04-13 NOTE — Telephone Encounter (Signed)
   Pre-operative Risk Assessment    Patient Name: [name] [name]  DOB: September 10, 1934 MRN: [id number]      Request for Surgical Clearance    Procedure:   Right carpule tunnel release and right long trigger release   Date of Surgery:  Clearance 05/02/22                                 Surgeon:  Dr. Leanora Cover Surgeon's Group or Practice Name:  [facility] Phone number:  [phone] Fax number:  [phone]   Type of Clearance Requested:   - Medical  - Pharmacy:  Hold Clopidogrel (Plavix) TBD   Type of Anesthesia:   Choice   Additional requests/questions:      Eston Mould   04/13/2022, 3:12 PM

## 2022-04-14 ENCOUNTER — Telehealth: Payer: Self-pay | Admitting: Family Medicine

## 2022-04-14 NOTE — Telephone Encounter (Signed)
Contacted [name] [name] to schedule their annual wellness visit. Appointment made for 05/17/2022.  [facility] Direct Dial: [phone]

## 2022-04-18 ENCOUNTER — Other Ambulatory Visit: Payer: Self-pay | Admitting: Family Medicine

## 2022-04-20 NOTE — Progress Notes (Unsigned)
{Choose 1 Note Type (Telehealth Visit or Telephone Visit):[phone]}  Evaluation Performed:  Preoperative cardiovascular risk assessment _____________   Date:  04/20/2022   Patient ID:  [name] [name], DOB 1934/08/06, MRN [id number] Patient Location:  Home Provider location:   Office  Primary Care Provider:  Abner Greenspan, MD Primary Cardiologist:  Sherren Mocha, MD  Chief Complaint / Patient Profile   88 y.o. y/o female with a h/o *** who is pending *** and presents today for telephonic preoperative cardiovascular risk assessment.  History of Present Illness    [name] [name] is a 88 y.o. female who presents via audio/video conferencing for a telehealth visit today.  Pt was last seen in cardiology clinic on *** by ***.  At that time [name] [name] was doing well ***.  The patient is now pending procedure as outlined above. Since her last visit, she ***  Past Medical History    Past Medical History:  Diagnosis Date   Alternating constipation and diarrhea    Anxiety state, unspecified    CHF (congestive heart failure) (HCC)    Complication of anesthesia    slow to wake up with surgery several years ago    COVID-19 virus infection 12/2018   Depressive disorder, not elsewhere classified    Diaphragmatic hernia without mention of obstruction or gangrene    Difficulty sleeping    Diverticulosis of colon (without mention of hemorrhage)    Esophageal reflux    External hemorrhoids without mention of complication    Family history of adverse reaction to anesthesia    Dad very slow to wake upi   Fatigue    Frequency of urination    Gallstones    Headache    Helicobacter pylori (H. pylori)    Hiatal hernia    History of nuclear stress test    Myoview 05/2021: EF 70, normal perfusion; low risk   History of transfusion    Lumbar spondylosis    Macular degeneration    Macular degeneration (senile) of retina, unspecified    Osteoarthrosis, unspecified whether  generalized or localized, unspecified site 08/11/2011   hx. "rhematoid arthritis", osteoarthritis, DDD, bursitis(hip)   Osteoporosis    Palpitations    PONV (postoperative nausea and vomiting)    Schatzki's ring    Shortness of breath 08/11/2011   with exertion only at present   Unspecified cerebral artery occlusion with cerebral infarction 08/11/2011   '97/ '06( TIA)-affected lt. side, slight weakness L side   Unspecified vitamin D deficiency    Past Surgical History:  Procedure Laterality Date   ABDOMINAL HYSTERECTOMY  08-11-11   APPENDECTOMY     BACK SURGERY  08-11-11   '11-hx. lumbar fusion with retained hardware   CATARACT EXTRACTION  08-11-11   Bilateral   ESOPHAGEAL DILATION     ESOPHAGOGASTRODUODENOSCOPY (EGD) WITH PROPOFOL N/A 12/06/2021   Procedure: ESOPHAGOGASTRODUODENOSCOPY (EGD) WITH PROPOFOL;  Surgeon: Daryel November, MD;  Location: [facility];  Service: Gastroenterology;  Laterality: N/A;   KNEE SURGERY  08-11-11   rt. knee scope   PATELLAR TENDON REPAIR Left 02/22/2014   Procedure: PATELLA TENDON REPAIR;  Surgeon: Gearlean Alf, MD;  Location: [facility];  Service: Orthopedics;  Laterality: Left;   PATELLAR TENDON REPAIR Left 06/25/2014   Procedure: LEFT PATELLA TENDON REPAIR;  Surgeon: Gaynelle Arabian, MD;  Location: [facility];  Service: Orthopedics;  Laterality: Left;   SAVORY DILATION N/A 12/06/2021   Procedure: SAVORY DILATION;  Surgeon: Daryel November, MD;  Location:  [facility];  Service: Gastroenterology;  Laterality: N/A;   TOTAL KNEE ARTHROPLASTY  08/16/2011   Procedure: TOTAL KNEE ARTHROPLASTY;  Surgeon: Gearlean Alf, MD;  [facility];  Service: Orthopedics;  Laterality: Right;   TOTAL KNEE ARTHROPLASTY Left 01/27/2014   Procedure: LEFT TOTAL KNEE ARTHROPLASTY;  Surgeon: Gearlean Alf, MD;  Location: [facility];  Service: Orthopedics;  Laterality: Left;   TUBAL LIGATION      Allergies  Allergies  Allergen Reactions   Alendronate Sodium Other (See  Comments)    REACTION: JAW PAIN   Celecoxib Other (See Comments)    REACTION: GI UPSET    Chocolate Diarrhea   Omeprazole Nausea Only   Protonix [Pantoprazole Sodium] Nausea And Vomiting   Rabeprazole Nausea And Vomiting   Latex Itching and Rash   Sulfa Antibiotics Rash    Home Medications    Prior to Admission medications   Medication Sig Start Date End Date Taking? Authorizing Provider  acetaminophen (TYLENOL) 325 MG tablet Take 2 tablets (650 mg total) by mouth every 6 (six) hours as needed for mild pain (or Fever >/= 101). 01/29/14   Perkins, Alexzandrew L, PA-C  APPLE CIDER VINEGAR PO Take 2 tablets by mouth at bedtime.    [provider]  atorvastatin (LIPITOR) 10 MG tablet TAKE 1 TABLET DAILY 12/29/21   Tower, Wynelle Fanny, MD  calcium carbonate (TUMS - DOSED IN MG ELEMENTAL CALCIUM) 500 MG chewable tablet Chew 1 tablet (200 mg of elemental calcium total) by mouth 4 (four) times daily as needed for indigestion or heartburn. 02/25/14   Perkins, Alexzandrew L, PA-C  clopidogrel (PLAVIX) 75 MG tablet Take 1 tablet (75 mg total) by mouth daily. 12/07/21   Daryel November, MD  diclofenac sodium (VOLTAREN) 1 % GEL Apply 1 application  topically at bedtime. 12/22/15   [provider]  estradiol (ESTRACE) 0.1 MG/GM vaginal cream USE A PEA SIZED AMOUNT OF CREAM VAGINALLY TWICE PER WEEK 03/14/22   Tower, Wynelle Fanny, MD  famotidine (PEPCID) 40 MG tablet Take 1 tablet (40 mg total) by mouth 2 (two) times daily. Patient taking differently: Take 40 mg by mouth daily. 10/26/21   Levin Erp, PA  furosemide (LASIX) 20 MG tablet TAKE 1 TABLET DAILY 10/12/21   Sherren Mocha, MD  gabapentin (NEURONTIN) 100 MG capsule TAKE 2 CAPSULES THREE TIMES A DAY 12/30/21   Tower, Wynelle Fanny, MD  Liniments (ABSORBINE ARTHRITIS STRENGTH EX) Apply 1 application  topically daily as needed (Back pain).    [provider]  loperamide (IMODIUM) 2 MG capsule Take 2 mg by mouth as needed for  diarrhea or loose stools.    [provider]  metoprolol succinate (TOPROL-XL) 25 MG 24 hr tablet TAKE ONE-HALF (1/2) TABLET AT BEDTIME 08/17/21   Weaver, Scott T, PA-C  Multiple Vitamins-Minerals (PRESERVISION AREDS PO) Take 1 capsule by mouth 2 (two) times daily.    [provider]  nitroGLYCERIN (NITROSTAT) 0.4 MG SL tablet Place 1 tablet (0.4 mg total) under the tongue every 5 (five) minutes as needed for chest pain. 05/07/21 04/13/22  [name] Dopp T, PA-C  pantoprazole (PROTONIX) 40 MG tablet Take 1 tablet (40 mg total) by mouth 2 (two) times daily. 10/26/21   Levin Erp, PA  Polyethyl Glycol-Propyl Glycol (SYSTANE OP) Apply 1-2 drops to eye 3 (three) times daily as needed (dry eyes.).     [provider]  sertraline (ZOLOFT) 100 MG tablet TAKE 2 TABLETS DAILY 04/19/22  Tower, Wynelle Fanny, MD    Physical Exam    Vital Signs:  [name] [name] does not have vital signs available for review today.***  Given telephonic nature of communication, physical exam is limited. AAOx3. NAD. Normal affect.  Speech and respirations are unlabored.  Accessory Clinical Findings    None  Assessment & Plan    1.  Preoperative Cardiovascular Risk Assessment:  The patient was advised that if she develops new symptoms prior to surgery to contact our office to arrange for a follow-up visit, and she verbalized understanding.  (Reminder: Include SBE prophylaxis/Antiplatelet/Anticoag Instructions***)  A copy of this note will be routed to requesting surgeon.  Time:   Today, I have spent *** minutes with the patient with telehealth technology discussing medical history, symptoms, and management plan.     Deberah Pelton, NP  04/20/2022, 7:36 AM

## 2022-04-21 ENCOUNTER — Telehealth: Payer: Self-pay | Admitting: Cardiovascular Disease

## 2022-04-21 ENCOUNTER — Ambulatory Visit: Payer: Medicare Other | Attending: Cardiology

## 2022-04-21 DIAGNOSIS — Z01810 Encounter for preprocedural cardiovascular examination: Secondary | ICD-10-CM

## 2022-04-21 NOTE — Telephone Encounter (Signed)
Per chart today:   Attempted to contact patient x 4.  A total of 4 voice messages were left.  She will need to reschedule her preoperative telephone appointment for another day and time.   Jossie Ng. Cleaver NP-C       Will route back to pre-op to see if they can have her rescheduled for clearance visit.

## 2022-04-21 NOTE — Telephone Encounter (Signed)
Follow Up;        Patient was returning a all from today. She does not kow who called.

## 2022-04-22 NOTE — Telephone Encounter (Signed)
Pt has been rescheduled to April 9 10:40, due to her surgery date and time of holding her medicine.

## 2022-04-22 NOTE — Telephone Encounter (Signed)
Preoperative team, please contact this patient and reschedule her telephone virtual preoperative cardiac appointment.  Several attempts were made to contact her during her previous appointment time.  Thank you for your help.  Thomasene Ripple. Adeoluwa Silvers NP-C     04/22/2022, 7:11 AM [facility] [address] Office [phone] Fax [phone]

## 2022-04-25 ENCOUNTER — Encounter (HOSPITAL_BASED_OUTPATIENT_CLINIC_OR_DEPARTMENT_OTHER): Payer: Self-pay | Admitting: Orthopedic Surgery

## 2022-04-25 ENCOUNTER — Other Ambulatory Visit: Payer: Self-pay | Admitting: Family Medicine

## 2022-04-25 ENCOUNTER — Telehealth: Payer: Self-pay | Admitting: Pharmacist

## 2022-04-25 ENCOUNTER — Other Ambulatory Visit: Payer: Self-pay

## 2022-04-25 DIAGNOSIS — M810 Age-related osteoporosis without current pathological fracture: Secondary | ICD-10-CM

## 2022-04-25 DIAGNOSIS — I5032 Chronic diastolic (congestive) heart failure: Secondary | ICD-10-CM

## 2022-04-25 DIAGNOSIS — E782 Mixed hyperlipidemia: Secondary | ICD-10-CM

## 2022-04-25 NOTE — Telephone Encounter (Signed)
PharmD reviewed patient chart to assess eligibility for [facility] services. Patient was determined to be a good candidate for the program given the complexity of the medication regimen and overall risk for hospitalization and/or high healthcare utilization.   Referral entered in order to outreach patient and offer appointment with PharmD. Referral cosigned to PCP.  

## 2022-04-26 ENCOUNTER — Ambulatory Visit: Payer: Medicare Other | Attending: Cardiovascular Disease

## 2022-04-26 DIAGNOSIS — Z01810 Encounter for preprocedural cardiovascular examination: Secondary | ICD-10-CM | POA: Diagnosis not present

## 2022-04-26 NOTE — Progress Notes (Signed)
Virtual Visit via Telephone Note   Because of [name]'s co-morbid illnesses, she is at least at moderate risk for complications without adequate follow up.  This format is felt to be most appropriate for this patient at this time.  The patient did not have access to video technology/had technical difficulties with video requiring transitioning to audio format only (telephone).  All issues noted in this document were discussed and addressed.  No physical exam could be performed with this format.  Please refer to the patient's chart for her consent to telehealth for [facility].  Evaluation Performed:  Preoperative cardiovascular risk assessment _____________   Date:  04/26/2022   Patient ID:  [name] [name], DOB March 26, 1934, MRN [id number] Patient Location:  Home Provider location:   Office  Primary Care Provider:  Judy Pimple, MD Primary Cardiologist:  Tonny Bollman, MD  Chief Complaint / Patient Profile   88 y.o. y/o female with a h/o chronic heart failure with preserved EF, mixed hyperlipidemia, aortic atherosclerosis who is pending right carpal tunnel release with right long trigger finger release and presents today for telephonic preoperative cardiovascular risk assessment.  History of Present Illness    [name] [name] is a 88 y.o. female who presents via audio/video conferencing for a telehealth visit today.  Pt was last seen in cardiology clinic on 11/17/2021 by Dr. Excell Seltzer.  At that time JAKAIYA NETHERLAND was doing well .  The patient is now pending procedure as outlined above. Since her last visit, she remained stable from a cardiac standpoint.  Today she denies chest pain, shortness of breath, lower extremity edema, fatigue, palpitations, melena, hematuria, hemoptysis, diaphoresis, weakness, presyncope, syncope, orthopnea, and PND.   Past Medical History    Past Medical History:  Diagnosis Date   Alternating constipation and diarrhea    Anxiety  state, unspecified    CHF (congestive heart failure)    Complication of anesthesia    slow to wake up with surgery several years ago    COVID-19 virus infection 12/2018   Depressive disorder, not elsewhere classified    Diaphragmatic hernia without mention of obstruction or gangrene    Difficulty sleeping    Diverticulosis of colon (without mention of hemorrhage)    Esophageal reflux    External hemorrhoids without mention of complication    Family history of adverse reaction to anesthesia    Dad very slow to wake upi   Fatigue    Frequency of urination    Gallstones    Headache    Helicobacter pylori (H. pylori)    Hiatal hernia    History of nuclear stress test    Myoview 05/2021: EF 70, normal perfusion; low risk   History of transfusion    Lumbar spondylosis    Macular degeneration    Macular degeneration (senile) of retina, unspecified    Osteoarthrosis, unspecified whether generalized or localized, unspecified site 08/11/2011   hx. "rhematoid arthritis", osteoarthritis, DDD, bursitis(hip)   Osteoporosis    Palpitations    PONV (postoperative nausea and vomiting)    Schatzki's ring    Shortness of breath 08/11/2011   with exertion only at present   Unspecified cerebral artery occlusion with cerebral infarction 08/11/2011   '97/ '06( TIA)-affected lt. side, slight weakness L side   Unspecified vitamin D deficiency    Past Surgical History:  Procedure Laterality Date   ABDOMINAL HYSTERECTOMY  08-11-11   APPENDECTOMY     BACK SURGERY  08-11-11   '  11-hx. lumbar fusion with retained hardware   CATARACT EXTRACTION  08-11-11   Bilateral   ESOPHAGEAL DILATION     ESOPHAGOGASTRODUODENOSCOPY (EGD) WITH PROPOFOL N/A 12/06/2021   Procedure: ESOPHAGOGASTRODUODENOSCOPY (EGD) WITH PROPOFOL;  Surgeon: Jenel Lucksunningham, Scott E, MD;  Location: [facility];  Service: Gastroenterology;  Laterality: N/A;   KNEE SURGERY  08-11-11   rt. knee scope   PATELLAR TENDON REPAIR Left 02/22/2014    Procedure: PATELLA TENDON REPAIR;  Surgeon: Loanne DrillingFrank Aluisio V, MD;  Location: [facility];  Service: Orthopedics;  Laterality: Left;   PATELLAR TENDON REPAIR Left 06/25/2014   Procedure: LEFT PATELLA TENDON REPAIR;  Surgeon: Ollen GrossFrank Aluisio, MD;  Location: [facility];  Service: Orthopedics;  Laterality: Left;   SAVORY DILATION N/A 12/06/2021   Procedure: SAVORY DILATION;  Surgeon: Jenel Lucksunningham, Scott E, MD;  Location: [facility];  Service: Gastroenterology;  Laterality: N/A;   TOTAL KNEE ARTHROPLASTY  08/16/2011   Procedure: TOTAL KNEE ARTHROPLASTY;  Surgeon: Loanne DrillingFrank V Aluisio, MD;  Location: [facility];  Service: Orthopedics;  Laterality: Right;   TOTAL KNEE ARTHROPLASTY Left 01/27/2014   Procedure: LEFT TOTAL KNEE ARTHROPLASTY;  Surgeon: Loanne DrillingFrank Aluisio V, MD;  [facility]: [facility];  Service: Orthopedics;  Laterality: Left;   TUBAL LIGATION      Allergies  Allergies  Allergen Reactions   Alendronate Sodium Other (See Comments)    REACTION: JAW PAIN   Celecoxib Other (See Comments)    REACTION: GI UPSET    Chocolate Diarrhea   Omeprazole Nausea Only   Protonix [Pantoprazole Sodium] Nausea And Vomiting   Rabeprazole Nausea And Vomiting   Latex Itching and Rash   Sulfa Antibiotics Rash    Home Medications    Prior to Admission medications   Medication Sig Start Date End Date Taking? Authorizing Provider  acetaminophen (TYLENOL) 325 MG tablet Take 2 tablets (650 mg total) by mouth every 6 (six) hours as needed for mild pain (or Fever >/= 101). 01/29/14   Perkins, Alexzandrew L, PA-C  APPLE CIDER VINEGAR PO Take 2 tablets by mouth at bedtime.    [provider]  atorvastatin (LIPITOR) 10 MG tablet TAKE 1 TABLET DAILY 12/29/21   Tower, Audrie GallusMarne A, MD  calcium carbonate (TUMS - DOSED IN MG ELEMENTAL CALCIUM) 500 MG chewable tablet Chew 1 tablet (200 mg of elemental calcium total) by mouth 4 (four) times daily as needed for indigestion or heartburn. 02/25/14   Perkins, Alexzandrew L, PA-C  clopidogrel (PLAVIX)  75 MG tablet Take 1 tablet (75 mg total) by mouth daily. 12/07/21   Jenel Lucksunningham, Scott E, MD  diclofenac sodium (VOLTAREN) 1 % GEL Apply 1 application  topically at bedtime. 12/22/15   [provider]  estradiol (ESTRACE) 0.1 MG/GM vaginal cream USE A PEA SIZED AMOUNT OF CREAM VAGINALLY TWICE PER WEEK 03/14/22   Tower, Idamae SchullerMarne A, MD  famotidine (PEPCID) 40 MG tablet TAKE 1 TABLET DAILY 04/25/22   Tower, Audrie GallusMarne A, MD  furosemide (LASIX) 20 MG tablet TAKE 1 TABLET DAILY 10/12/21   Tonny Bollmanooper, Michael, MD  gabapentin (NEURONTIN) 100 MG capsule TAKE 2 CAPSULES THREE TIMES A DAY 12/30/21   Tower, Audrie GallusMarne A, MD  Liniments (ABSORBINE ARTHRITIS STRENGTH EX) Apply 1 application  topically daily as needed (Back pain).    [provider]  loperamide (IMODIUM) 2 MG capsule Take 2 mg by mouth as needed for diarrhea or loose stools.    [provider]  metoprolol succinate (TOPROL-XL) 25 MG 24 hr tablet TAKE ONE-HALF (1/2) TABLET AT BEDTIME 08/17/21  Weaver, Scott T, PA-C  Multiple Vitamins-Minerals (PRESERVISION AREDS PO) Take 1 capsule by mouth 2 (two) times daily.    [provider]  nitroGLYCERIN (NITROSTAT) 0.4 MG SL tablet Place 1 tablet (0.4 mg total) under the tongue every 5 (five) minutes as needed for chest pain. 05/07/21 04/13/22  Tereso Newcomer T, PA-C  pantoprazole (PROTONIX) 40 MG tablet Take 1 tablet (40 mg total) by mouth 2 (two) times daily. 10/26/21   Unk Lightning, PA  Polyethyl Glycol-Propyl Glycol (SYSTANE OP) Apply 1-2 drops to eye 3 (three) times daily as needed (dry eyes.).     [provider]  sertraline (ZOLOFT) 100 MG tablet TAKE 2 TABLETS DAILY 04/19/22   Tower, Audrie Gallus, MD    Physical Exam    Vital Signs:  Makynzee Curles Kleinpeter does not have vital signs available for review today.  Given telephonic nature of communication, physical exam is limited. AAOx3. NAD. Normal affect.  Speech and respirations are unlabored.  Accessory Clinical Findings     None  Assessment & Plan    1.  Preoperative Cardiovascular Risk Assessment: Right carpule tunnel release and right long trigger release,  Dr. Betha Loa ,  [facility]       Primary Cardiologist: Tonny Bollman, MD  Chart reviewed as part of pre-operative protocol coverage. Given past medical history and time since last visit, based on ACC/AHA guidelines, KATONA SECKER would be at acceptable risk for the planned procedure without further cardiovascular testing.   Her RCRI is a class II risk, 0.9% risk of major cardiac event.  She is able to complete greater than 4 METS of physical activity.   From a cardiac perspective, she may hold Plavix for 5 days prior to procedure. Would recommend that provider ask about TIA/stroke history of if she is taking for any other reason.   Patient was advised that if she develops new symptoms prior to surgery to contact our office to arrange a follow-up appointment.  He verbalized understanding.  I will route this recommendation to the requesting party via Epic fax function and remove from pre-op pool.        Time:   Today, I have spent 8 minutes with the patient with telehealth technology discussing medical history, symptoms, and management plan.  Prior to her phone evaluation I spent greater than 10 minutes reviewing her past medical history and cardiac medications.   Ronney Asters, NP  04/26/2022, 7:36 AM

## 2022-04-26 NOTE — Progress Notes (Signed)
Pending cardiac clearance planned for 04/26/2022.  History of home O2 use infrequently at hs only per patient. Chart reviewed with Dr. Tacy Dura and ok to proceed with planned surgery pending cardiac clearance.

## 2022-04-27 ENCOUNTER — Telehealth: Payer: Self-pay

## 2022-04-27 ENCOUNTER — Encounter (HOSPITAL_BASED_OUTPATIENT_CLINIC_OR_DEPARTMENT_OTHER)
Admission: RE | Admit: 2022-04-27 | Discharge: 2022-04-27 | Disposition: A | Discharge status: Home / Self Care | Payer: Medicare Other | Source: Ambulatory Visit | Attending: Orthopedic Surgery | Admitting: Orthopedic Surgery

## 2022-04-27 DIAGNOSIS — Z01818 Encounter for other preprocedural examination: Secondary | ICD-10-CM

## 2022-04-27 DIAGNOSIS — Z01812 Encounter for preprocedural laboratory examination: Secondary | ICD-10-CM | POA: Insufficient documentation

## 2022-04-27 NOTE — Progress Notes (Signed)
Care Management & Coordination Services Pharmacy Team  Reason for Encounter: Appointment Reminder  Contacted patient to confirm telephone appointment with Al Corpus , PharmD on 04/28/22 at 10:00. Spoke with patient on 04/27/2022   Do you have any problems getting your medications? No  What is your top health concern you would like to discuss at your upcoming visit? Patient reports getting ready for right carpal tunnel surgery  Have you seen any other providers since your last visit with PCP? Yes- Cardiology telemedicine, orthopedics   Chart review:  Recent office visits:  12/12/2-Marne Tower,MD(PCP)- right hand pain and numbness,use OTC gel and splints at night, f/u hand surgeon.  Recent consult visits:  04/26/22-Jesse Cleaver,NP(cardio)-telemedicine- pre op CTS 02/16/22-Kevin Kuzma,MD(ortho)-rt. Wrist CTS,schedule surgery  12/30/21-[facility] - hiatal hernia- no data 11/17/21-Michael Cooper,MD(cardio)-f/u SOB,EKG,no medication changes,f/u 1 year  Hospital visits:  02/28/22- at [facility] - [facility] -discharged  12/06/21-[facility]- upper endoscopy - discharged home  Star Rating Drugs:  Medication:  Last Fill: [facility] Atorvastatin 10mg  12/29/21 90          per [facility],awaiting new rx from provider.   Care Gaps: Annual wellness visit in last year? Yes   Al Corpus, PharmD notified  Burt Knack, [facility] Clinical Pharmacy Assistant [phone]

## 2022-04-27 NOTE — Progress Notes (Signed)

## 2022-04-28 ENCOUNTER — Telehealth: Payer: Self-pay | Admitting: Pharmacist

## 2022-04-28 ENCOUNTER — Ambulatory Visit: Payer: Medicare Other | Admitting: Pharmacist

## 2022-04-28 DIAGNOSIS — F4323 Adjustment disorder with mixed anxiety and depressed mood: Secondary | ICD-10-CM

## 2022-04-28 LAB — BASIC METABOLIC PANEL
Anion gap: 12 (ref 5–15)
BUN: 11 mg/dL (ref 8–23)
CO2: 26 mmol/L (ref 22–32)
Calcium: 9.1 mg/dL (ref 8.9–10.3)
Chloride: 103 mmol/L (ref 98–111)
Creatinine, Ser: 0.9 mg/dL (ref 0.44–1.00)
GFR, Estimated: >60 mL/min (ref >60)
Glucose, Bld: 52 mg/dL — ABNORMAL LOW (ref 70–99)
Potassium: 4.1 mmol/L (ref 3.5–5.1)
Sodium: 141 mmol/L (ref 135–145)

## 2022-04-28 MED ORDER — BUPROPION HCL ER (XL) 150 MG PO TB24: 150.0000 mg | ORAL_TABLET | Freq: Every day | ORAL | 0 refills | Status: DC

## 2022-04-28 NOTE — Telephone Encounter (Signed)
Called patient reviewed all information and repeated back to me. Will call if any questions.  I have set up follow up for patient.

## 2022-04-28 NOTE — Telephone Encounter (Signed)
Agreed and sent in  Thanks for that   Please have her f/u with me in about a month to check in  If any side effects hold med and let us know

## 2022-04-28 NOTE — Progress Notes (Signed)
Care Management & Coordination Services Pharmacy Note  04/28/2022 Name:  [name] [name] MRN:  [id number] DOB:  31-May-1934  Summary: Initial televist -Depression: pt reports she feels very depressed lately, frequent crying spells, fatigue, trouble concentrating; she reports she has been on sertraline since 1998, she denies use of other mood medication prior to that; discussed SSRI efficacy does tend to wane after 5-10 years; discussed switching sertraline to something else vs adding on bupropion, pt opted for add-on treatment -Osteopenia: pt is not taking calcium/vitamin D  Recommendations/Changes made from today's visit: -Add bupropion XL 150 mg daily -Advised patient to call Behavioral Health for virtual appt [phone]) -Advised to start Calcium/Vitamin D combo supplement daily  Follow up plan: -Pharmacist follow up televisit scheduled for 6 weeks -Carpal tunnel surgery 05/02/22; PCP appt 06/24/22 (CPE)    Subjective: [name] [name] is an 88 y.o. year old female who is a primary patient of Tower, Audrie Gallus, MD.  The care coordination team was consulted for assistance with disease management and care coordination needs.    Engaged with patient by telephone for initial visit. Patient lives with her son. She has 2 rental properties, she must make decisions about whether to rent or sell. She enjoy bible studies. She has an aid 3 times a week.  Recent office visits: 12/28/21 Dr Milinda Antis OV: carpal tunnel, advised f/u with ortho hand specialist  05/12/21 Dr Elroy Channel annual visit  Recent consult visits: 02/16/22 Dr Merlyn Lot (Ortho surg): carpal tunnel, pursue surgical intervention  11/17/21 Dr Excell Seltzer (Cardiology): HF - SOB w/ activity, stable. No changes.   10/26/21 PA Lemmon (GI): esophageal dysphagia - Scheduled EGD. Change esomeprazole to pantoprazole 40 mg BID, increase famotidine to BID.  Hospital visits: 02/28/22 ED visit ([facility]): mechanical fall  12/06/21 planned admission ([facility]):  EGD  10/14/21 ED visit [facility]): atypical chest pain - likely reflux related.   Objective:  Lab Results  Component Value Date   CREATININE 0.90 04/27/2022   BUN 11 04/27/2022   GFR 68.74 05/12/2021   EGFR 60 02/16/2021   GFRNONAA >60 04/27/2022   GFRAA 79 07/03/2017   NA 141 04/27/2022   K 4.1 04/27/2022   CALCIUM 9.1 04/27/2022   CO2 26 04/27/2022   GLUCOSE 52 (L) 04/27/2022    Lab Results  Component Value Date/Time   HGBA1C 6.2 05/12/2021 12:27 PM   HGBA1C 6.1 03/19/2020 12:23 PM   GFR 68.74 05/12/2021 12:27 PM   GFR 72.59 03/19/2020 12:23 PM    Last diabetic Eye exam: No results found for: "HMDIABEYEEXA"  Last diabetic Foot exam: No results found for: "HMDIABFOOTEX"   Lab Results  Component Value Date   CHOL 186 02/16/2021   HDL 72 02/16/2021   LDLCALC 96 02/16/2021   TRIG 103 02/16/2021   CHOLHDL 2.6 02/16/2021       Latest Ref Rng & Units 09/24/2021    9:45 AM 05/12/2021   12:27 PM 02/16/2021    3:50 PM  Hepatic Function  Total Protein 6.5 - 8.1 g/dL 6.9  7.2  7.3   Albumin 3.5 - 5.0 g/dL 3.9  4.3  4.5   AST 15 - 41 U/L 21  20  24    ALT 0 - 44 U/L 15  13  16    Alk Phosphatase 38 - 126 U/L 81  83  107   Total Bilirubin 0.3 - 1.2 mg/dL 0.7  0.5  0.2     Lab Results  Component Value Date/Time   TSH 2.97 05/12/2021  12:27 PM   TSH 2.17 03/19/2020 12:23 PM       Latest Ref Rng & Units 09/29/2021    1:01 PM 09/24/2021    9:45 AM 05/12/2021   12:27 PM  CBC  WBC 4.0 - 10.5 K/uL 5.6  6.3  5.3   Hemoglobin 12.0 - 15.0 g/dL 82.9  56.2  13.0   Hematocrit 36.0 - 46.0 % 39.2  38.8  40.5   Platelets 150.0 - 400.0 K/uL 255.0  243  234.0     Lab Results  Component Value Date/Time   VD25OH 45.37 05/12/2021 12:27 PM   VD25OH 42.55 03/19/2020 12:23 PM   VITAMINB12 666 05/12/2021 12:27 PM   VITAMINB12 1,304 (H) 06/27/2018 10:55 AM    Clinical ASCVD: Yes  The ASCVD Risk score (Arnett DK, et al., 2019) failed to calculate for the following reasons:   The 2019 ASCVD  risk score is only valid for ages 60 to 59   The patient has a prior MI or stroke diagnosis        05/12/2021   11:34 AM 05/11/2020   10:56 AM 04/29/2019   11:27 AM  Depression screen PHQ 2/9  Decreased Interest 0 0 0  Down, Depressed, Hopeless 0 0 0  PHQ - 2 Score 0 0 0     Social History   Tobacco Use  Smoking Status Never  Smokeless Tobacco Never   BP Readings from Last 3 Encounters:  02/28/22 (!) 131/90  12/28/21 122/68  12/06/21 (!) 123/50   Pulse Readings from Last 3 Encounters:  02/28/22 60  12/28/21 77  12/06/21 (!) 55   Wt Readings from Last 3 Encounters:  12/28/21 196 lb 2 oz (89 kg)  12/06/21 200 lb (90.7 kg)  11/17/21 199 lb 9.6 oz (90.5 kg)   BMI Readings from Last 3 Encounters:  12/28/21 34.20 kg/m  12/06/21 33.28 kg/m  11/17/21 33.22 kg/m    Allergies  Allergen Reactions   Alendronate Sodium Other (See Comments)    REACTION: JAW PAIN   Celecoxib Other (See Comments)    REACTION: GI UPSET    Chocolate Diarrhea   Omeprazole Nausea Only   Protonix [Pantoprazole Sodium] Nausea And Vomiting   Rabeprazole Nausea And Vomiting   Latex Itching and Rash   Sulfa Antibiotics Rash    Medications Reviewed Today     Reviewed by Kathyrn Sheriff, RPH (Pharmacist) on 04/28/22 at 1057  Med List Status: <None>   Medication Order Taking? Sig Documenting Provider Last Dose Status Informant  acetaminophen (TYLENOL) 500 MG tablet [id number] Yes Take 500 mg by mouth every 6 (six) hours as needed. [provider] Taking Active   APPLE CIDER VINEGAR PO [id number] Yes Take 2 tablets by mouth at bedtime. [provider] Taking Active Self  atorvastatin (LIPITOR) 10 MG tablet [id number] Yes TAKE 1 TABLET DAILY Tower, Audrie Gallus, MD Taking Active   calcium carbonate (TUMS - DOSED IN MG ELEMENTAL CALCIUM) 500 MG chewable tablet [id number] Yes Chew 1 tablet (200 mg of elemental calcium total) by mouth 4 (four) times daily as needed for indigestion or  heartburn. Perkins, Alexzandrew L, PA-C Taking Active Self  clopidogrel (PLAVIX) 75 MG tablet [id number] Yes Take 1 tablet (75 mg total) by mouth daily. Jenel Lucks, MD Taking Active   diclofenac sodium (VOLTAREN) 1 % GEL [id number] Yes Apply 1 application  topically at bedtime. [provider] Taking Active Self           Med Note (  Ernie Hew   Wed Feb 14, 2018  1:27 PM)    estradiol (ESTRACE) 0.1 MG/GM vaginal cream [id number] Yes USE A PEA SIZED AMOUNT OF CREAM VAGINALLY TWICE PER WEEK Tower, Audrie Gallus, MD Taking Active   famotidine (PEPCID) 40 MG tablet [id number] Yes TAKE 1 TABLET DAILY Tower, Audrie Gallus, MD Taking Active   furosemide (LASIX) 20 MG tablet [id number] Yes TAKE 1 TABLET DAILY Tonny Bollman, MD Taking Active Self  gabapentin (NEURONTIN) 100 MG capsule [id number] Yes TAKE 2 CAPSULES THREE TIMES A DAY Tower, Audrie Gallus, MD Taking Active   Liniments (ABSORBINE ARTHRITIS STRENGTH EX) [id number] Yes Apply 1 application  topically daily as needed (Back pain). [provider] Taking Active Self  loperamide (IMODIUM) 2 MG capsule [id number] Yes Take 2 mg by mouth as needed for diarrhea or loose stools. [provider] Taking Active Self  metoprolol succinate (TOPROL-XL) 25 MG 24 hr tablet [id number] Yes TAKE ONE-HALF (1/2) TABLET AT BEDTIME Tereso Newcomer T, PA-C Taking Active Self  Multiple Vitamins-Minerals (PRESERVISION AREDS PO) [id number] Yes Take 1 capsule by mouth 2 (two) times daily. [provider] Taking Active Self  nitroGLYCERIN (NITROSTAT) 0.4 MG SL tablet [id number]  Place 1 tablet (0.4 mg total) under the tongue every 5 (five) minutes as needed for chest pain. Tereso Newcomer T, PA-C  Expired 04/13/22 2359 Self  pantoprazole (PROTONIX) 40 MG tablet [id number] Yes Take 1 tablet (40 mg total) by mouth 2 (two) times daily. Unk Lightning, [place] Taking Active Self  Polyethyl Glycol-Propyl Glycol (SYSTANE OP) [id number]  Apply 1-2 drops to eye 3  (three) times daily as needed (dry eyes.).  [provider]  Active Self  sertraline (ZOLOFT) 100 MG tablet [id number] Yes TAKE 2 TABLETS DAILY Tower, Audrie Gallus, MD Taking Active             SDOH:  (Social Determinants of Health) assessments and interventions performed: Yes SDOH Interventions    Flowsheet Row Clinical Support from 02/09/2018 in [facility] Clinical Support from 02/08/2017 in [facility] Clinical Support from 12/14/2015 in [facility] [place]  SDOH Interventions     Depression Interventions/Treatment  Currently on Treatment  [referral to PCP] --  [referral to PCP] --  [pt is being referred to PCP for for further evaluation]       Medication Assistance: None required.  Patient affirms current coverage meets needs.  Medication Access: Within the past 30 days, how often has patient missed a dose of medication? 0 Is a pillbox or other method used to improve adherence? Yes  Factors that may affect medication adherence? no barriers identified Are meds synced by current pharmacy? No  Are meds delivered by current pharmacy? Yes  Does patient experience delays in picking up medications due to transportation concerns? No   [facility] Services Reviewed: Is patient disadvantaged to use [facility]?: Yes  Current Rx insurance plan: [employer] Name and location of Current pharmacy:  [id number] [place], [place] - [address] AT [facility] [address] [facility] [address] [place] [address] Phone: [phone] Fax: [phone]  [facility] - [place], [place] - [address] [place] [address] Phone: [phone] Fax: [phone]  [facility] services reviewed with patient today?: No  Patient requests to transfer care to [facility] Pharmacy?: No  Reason patient declined to change  pharmacies:  Disadvantaged due to insurance/mail order  Compliance/Adherence/Medication fill history: Care Gaps: None  Star-Rating Drugs: Atorvastatin - PDC 88%; LF 12/29/21 x 90 ds   Assessment/Plan   Heart Failure (Goal: BP < 140/90; prevent exacerbations) -Controlled - pt reports SOB, but declines swelling;she has lost 11 lbs (trying to lose weight - apple cider vinegar gummies, Keto-vite, cutting back on diet) -Current home BP/HR readings: none available -Current home daily weights: not checking; current weight 190 lbs -Last ejection fraction: 60-65% (Date: 02/2021) -HF type: HFpEF (EF > 50%) -NYHA Class: II (slight limitation of activity) -Current treatment: Furosemide 20 mg - alternate 1 and 2 tablets QOD- Appropriate, Effective, Safe, Accessible Metoprolol succinate 25 mg - 1/2 tab HS - Appropriate, Effective, Safe, Accessible -Medications previously tried: n/a  -Educated on Benefits of medications for managing symptoms and prolonging life Importance of weighing daily; if you gain more than 3 pounds in one day or 5 pounds in one week, contact provider -Recommended to continue current medication  Hyperlipidemia: (LDL goal < 100) -Controlled - LDL 96 (01/2021) reasonable given age, lack of cardiac events -Hx aortic atherosclerosis on CT; no hx of cardiac event -Hx CVA 1998, 2021 -Current treatment: Atorvastatin 10 mg daily - Appropriate, Effective, Safe, Accessible Clopidogrel 75 mg daily - Appropriate, Effective, Safe, Accessible Nitroglycerin 0.4 mg SL PRN - has not needed -Medications previously tried: n/a  -Educated on Cholesterol goals;  -Recommended to continue current medication  Depression/Anxiety (Goal: manage symptoms) -Uncontrolled - pt reports she feels very depressed lately, frequent crying spells, fatigue, trouble concentrating; she reports she has been on sertraline since 1998, she denies use of other mood medication prior to that -PHQ9: 0 (04/2021) -GAD7: not on  file -Connected with PCP for mental health support -Current treatment: Sertraline 100 mg BID - Appropriate, Query Effective -Medications previously tried/failed: xanax -discussed SSRIs do tend to wane in efficacy after about 5-10 years; discussed switching sertraline to something else vs adding on bupropion, pt opted for add-on treatment -Recommend to continue sertraline; add buproprion XL 150 mg daily  Osteopenia (Goal: prevent fractures) -Not ideally controlled - not taking ca/vit D -Last DEXA Scan: 09/2019; no hx of fracture  T-Score femoral neck: -2.1  T-Score lumbar spine: -0.6  10-year probability of major osteoporotic fracture: 15.5%  10-year probability of hip fracture: 4.8% -Patient is a candidate for pharmacologic treatment due to T-Score -1.0 to -2.5 and 10-year risk of hip fracture > 3% -Current treatment  None -Medications previously tried: alendronate, Evista x 5 years (ended 2017) -Recommend [phone] units of vitamin D daily. Recommend 1200 mg of calcium daily from dietary and supplemental sources.  GERD (Goal: minimize symptoms of reflux ) -Controlled - pt reports improvement with PPI -S/p EGD; hx hiatal hernia -Current treatment  Pantoprazole 40 mg BID - Appropriate, Effective, Safe, Accessible Famotidine 40 mg daily - Appropriate, Effective, Safe, Accessible Tums PRN - not needed as much -Medications previously tried: none reported  -Recommended to continue current medication  Chronic pain (Goal: manage symptoms) -Controlled -Lumbar degenerative disc disease; hx trigeminal neuralgia -Current treatment  Gabapentin 100 mg - 2 cap TID - Appropriate, Effective, Safe, Accessible Voltaren gel - uses on knee, fingers - Appropriate, Effective, Safe, Accessible Tylenol 500 mg PRN - Appropriate, Effective, Safe, Accessible -Medications previously tried: n/a -Discussed Tylenol maximum daily dose 3000 mg/day  -Recommended to continue current medication  Health  Maintenance -OTC: Preservision Areds, apple cider vinegar   Al Corpus, PharmD, BCACP, CPP Clinical Pharmacist Practitioner [facility]  at [facility] [phone]

## 2022-04-28 NOTE — Telephone Encounter (Signed)
Discussed depression with patient. She reports she feels very depressed lately, frequent crying spells, fatigue, trouble concentrating; she reports she has been on sertraline since 1998, and she feels it is not working well for her anymore. Discussed SSRI efficacy does tend to wane after 5-10 years; discussed switching sertraline to something else vs adding on bupropion, pt opted for add-on treatment. Also discussed benefits of counseling, pt agreed to contact counselor for virtual appt.  Recommendations: -Add bupropion XL 150 mg daily. Follow up with PharmD 6 weeks. -Advised patient to call Behavioral Health for virtual appt [phone])  Routing to PCP for input.

## 2022-04-29 ENCOUNTER — Other Ambulatory Visit: Payer: Self-pay | Admitting: *Deleted

## 2022-04-29 MED ORDER — PANTOPRAZOLE SODIUM 40 MG PO TBEC: 40.0000 mg | DELAYED_RELEASE_TABLET | Freq: Two times a day (BID) | ORAL | 5 refills | Status: DC

## 2022-04-29 NOTE — Patient Instructions (Signed)
Visit Information  Phone number for Pharmacist: [phone]  Thank you for meeting with me to discuss your medications! I look forward to working with you to achieve your health care goals. Below is a summary of what we talked about during the visit:  Recommendations/Changes made from today's visit: -Add bupropion XL 150 mg daily -Advised patient to call Behavioral Health for virtual appt [phone]) -Advised to start Calcium/Vitamin D combo supplement daily  Follow up plan: -Health Concierge will call patient 2 weeks for update on bupropion -Pharmacist follow up televisit scheduled for 6 weeks -Carpal tunnel surgery 05/02/22; PCP appt 06/24/22 (CPE)   Al Corpus, PharmD, BCACP Clinical Pharmacist [facility] [phone]

## 2022-05-02 ENCOUNTER — Ambulatory Visit (HOSPITAL_BASED_OUTPATIENT_CLINIC_OR_DEPARTMENT_OTHER): Payer: Medicare Other | Admitting: Anesthesiology

## 2022-05-02 ENCOUNTER — Encounter (HOSPITAL_BASED_OUTPATIENT_CLINIC_OR_DEPARTMENT_OTHER): Payer: Self-pay | Admitting: Orthopedic Surgery

## 2022-05-02 ENCOUNTER — Encounter (HOSPITAL_BASED_OUTPATIENT_CLINIC_OR_DEPARTMENT_OTHER)
Admission: RE | Disposition: A | Discharge status: Home / Self Care | Payer: Self-pay | Source: Home / Self Care | Attending: Orthopedic Surgery

## 2022-05-02 ENCOUNTER — Ambulatory Visit (HOSPITAL_BASED_OUTPATIENT_CLINIC_OR_DEPARTMENT_OTHER)
Admission: RE | Admit: 2022-05-02 | Discharge: 2022-05-02 | Disposition: A | Discharge status: Home / Self Care | Payer: Medicare Other | Attending: Orthopedic Surgery | Admitting: Orthopedic Surgery

## 2022-05-02 DIAGNOSIS — Z7902 Long term (current) use of antithrombotics/antiplatelets: Secondary | ICD-10-CM | POA: Insufficient documentation

## 2022-05-02 DIAGNOSIS — F418 Other specified anxiety disorders: Secondary | ICD-10-CM

## 2022-05-02 DIAGNOSIS — I2510 Atherosclerotic heart disease of native coronary artery without angina pectoris: Secondary | ICD-10-CM

## 2022-05-02 DIAGNOSIS — G5601 Carpal tunnel syndrome, right upper limb: Secondary | ICD-10-CM | POA: Diagnosis not present

## 2022-05-02 DIAGNOSIS — Z01818 Encounter for other preprocedural examination: Secondary | ICD-10-CM

## 2022-05-02 DIAGNOSIS — M1990 Unspecified osteoarthritis, unspecified site: Secondary | ICD-10-CM | POA: Diagnosis not present

## 2022-05-02 DIAGNOSIS — I509 Heart failure, unspecified: Secondary | ICD-10-CM

## 2022-05-02 DIAGNOSIS — K219 Gastro-esophageal reflux disease without esophagitis: Secondary | ICD-10-CM | POA: Insufficient documentation

## 2022-05-02 DIAGNOSIS — M65331 Trigger finger, right middle finger: Secondary | ICD-10-CM | POA: Insufficient documentation

## 2022-05-02 HISTORY — PX: CARPAL TUNNEL RELEASE: SHX101

## 2022-05-02 HISTORY — PX: TRIGGER FINGER RELEASE: SHX641

## 2022-05-02 SURGERY — CARPAL TUNNEL RELEASE
Anesthesia: Regional | Site: Middle Finger | Laterality: Right

## 2022-05-02 MED ORDER — OXYCODONE HCL 5 MG/5ML PO SOLN: 5.0000 mg | Freq: Once | ORAL | Status: DC | PRN

## 2022-05-02 MED ORDER — ONDANSETRON HCL 4 MG/2ML IJ SOLN
INTRAMUSCULAR | Status: DC | PRN
  Administered 2022-05-02: 4 mg via INTRAVENOUS

## 2022-05-02 MED ORDER — MIDAZOLAM HCL 2 MG/2ML IJ SOLN
INTRAMUSCULAR | Status: AC
  Filled 2022-05-02: qty 2

## 2022-05-02 MED ORDER — FENTANYL CITRATE (PF) 100 MCG/2ML IJ SOLN
INTRAMUSCULAR | Status: AC
  Filled 2022-05-02: qty 2

## 2022-05-02 MED ORDER — HYDROCODONE-ACETAMINOPHEN 5-325 MG PO TABS: ORAL_TABLET | ORAL | 0 refills | Status: DC

## 2022-05-02 MED ORDER — CEFAZOLIN SODIUM-DEXTROSE 2-4 GM/100ML-% IV SOLN
2.0000 g | INTRAVENOUS | Status: AC
  Administered 2022-05-02: 2 g via INTRAVENOUS

## 2022-05-02 MED ORDER — OXYCODONE HCL 5 MG PO TABS: 5.0000 mg | ORAL_TABLET | Freq: Once | ORAL | Status: DC | PRN

## 2022-05-02 MED ORDER — ONDANSETRON HCL 4 MG/2ML IJ SOLN: 4.0000 mg | Freq: Once | INTRAMUSCULAR | Status: DC | PRN

## 2022-05-02 MED ORDER — LACTATED RINGERS IV SOLN: INTRAVENOUS | Status: DC

## 2022-05-02 MED ORDER — FENTANYL CITRATE (PF) 100 MCG/2ML IJ SOLN
INTRAMUSCULAR | Status: DC | PRN
  Administered 2022-05-02: 50 ug via INTRAVENOUS

## 2022-05-02 MED ORDER — LIDOCAINE HCL (PF) 0.5 % IJ SOLN
INTRAMUSCULAR | Status: DC | PRN
  Administered 2022-05-02: 30 mL via INTRAVENOUS

## 2022-05-02 MED ORDER — BUPIVACAINE HCL (PF) 0.25 % IJ SOLN
INTRAMUSCULAR | Status: DC | PRN
  Administered 2022-05-02: 9 mL

## 2022-05-02 MED ORDER — PROPOFOL 500 MG/50ML IV EMUL
INTRAVENOUS | Status: DC | PRN
  Administered 2022-05-02: 100 ug/kg/min via INTRAVENOUS

## 2022-05-02 MED ORDER — FENTANYL CITRATE (PF) 100 MCG/2ML IJ SOLN: 25.0000 ug | INTRAMUSCULAR | Status: DC | PRN

## 2022-05-02 MED ORDER — 0.9 % SODIUM CHLORIDE (POUR BTL) OPTIME
TOPICAL | Status: DC | PRN
  Administered 2022-05-02: 50 mL

## 2022-05-02 MED ORDER — CEFAZOLIN SODIUM-DEXTROSE 2-4 GM/100ML-% IV SOLN
INTRAVENOUS | Status: AC
  Filled 2022-05-02: qty 100

## 2022-05-02 SURGICAL SUPPLY — 44 items
APL PRP STRL LF DISP 70% ISPRP (MISCELLANEOUS) ×2
BLADE SURG 15 STRL LF DISP TIS (BLADE) ×4 IMPLANT
BLADE SURG 15 STRL SS (BLADE) ×4
BNDG CMPR 5X2 CHSV 1 LYR STRL (GAUZE/BANDAGES/DRESSINGS)
BNDG CMPR 5X3 KNIT ELC UNQ LF (GAUZE/BANDAGES/DRESSINGS) ×2
BNDG CMPR 9X4 STRL LF SNTH (GAUZE/BANDAGES/DRESSINGS) ×2
BNDG COHESIVE 2X5 TAN ST LF (GAUZE/BANDAGES/DRESSINGS) ×2 IMPLANT
BNDG ELASTIC 3INX 5YD STR LF (GAUZE/BANDAGES/DRESSINGS) ×2 IMPLANT
BNDG ESMARK 4X9 LF (GAUZE/BANDAGES/DRESSINGS) IMPLANT
BNDG GAUZE DERMACEA FLUFF 4 (GAUZE/BANDAGES/DRESSINGS) ×2 IMPLANT
BNDG GZE DERMACEA 4 6PLY (GAUZE/BANDAGES/DRESSINGS) ×2
CHLORAPREP W/TINT 26 (MISCELLANEOUS) ×2 IMPLANT
CORD BIPOLAR FORCEPS 12FT (ELECTRODE) ×2 IMPLANT
COVER BACK TABLE 60X90IN (DRAPES) ×2 IMPLANT
COVER MAYO STAND STRL (DRAPES) ×2 IMPLANT
CUFF TOURN SGL QUICK 18X4 (TOURNIQUET CUFF) ×2 IMPLANT
DRAPE EXTREMITY T 121X128X90 (DISPOSABLE) ×2 IMPLANT
DRAPE SURG 17X23 STRL (DRAPES) ×2 IMPLANT
GAUZE PAD ABD 8X10 STRL (GAUZE/BANDAGES/DRESSINGS) ×2 IMPLANT
GAUZE SPONGE 4X4 12PLY STRL (GAUZE/BANDAGES/DRESSINGS) ×2 IMPLANT
GAUZE XEROFORM 1X8 LF (GAUZE/BANDAGES/DRESSINGS) ×2 IMPLANT
GLOVE BIO SURGEON STRL SZ7.5 (GLOVE) ×2 IMPLANT
GLOVE BIOGEL PI IND STRL 7.0 (GLOVE) IMPLANT
GLOVE BIOGEL PI IND STRL 8 (GLOVE) ×2 IMPLANT
GLOVE SURG SYN 7.5  E (GLOVE) ×2
GLOVE SURG SYN 7.5 E (GLOVE) ×2 IMPLANT
GLOVE SURG SYN 7.5 PF PI (GLOVE) IMPLANT
GLOVE SURG SYN 8.0 (GLOVE) ×2 IMPLANT
GLOVE SURG SYN 8.0 PF PI (GLOVE) IMPLANT
GOWN STRL REUS W/ TWL LRG LVL3 (GOWN DISPOSABLE) ×2 IMPLANT
GOWN STRL REUS W/ TWL XL LVL3 (GOWN DISPOSABLE) IMPLANT
GOWN STRL REUS W/TWL LRG LVL3 (GOWN DISPOSABLE)
GOWN STRL REUS W/TWL XL LVL3 (GOWN DISPOSABLE) ×4 IMPLANT
NDL HYPO 25X1 1.5 SAFETY (NEEDLE) ×2 IMPLANT
NEEDLE HYPO 25X1 1.5 SAFETY (NEEDLE) ×2 IMPLANT
NS IRRIG 1000ML POUR BTL (IV SOLUTION) ×2 IMPLANT
PACK BASIN DAY SURGERY FS (CUSTOM PROCEDURE TRAY) ×2 IMPLANT
PADDING CAST ABS COTTON 4X4 ST (CAST SUPPLIES) ×2 IMPLANT
STOCKINETTE 4X48 STRL (DRAPES) ×2 IMPLANT
SUT ETHILON 4 0 PS 2 18 (SUTURE) ×2 IMPLANT
SYR BULB EAR ULCER 3OZ GRN STR (SYRINGE) ×2 IMPLANT
SYR CONTROL 10ML LL (SYRINGE) ×2 IMPLANT
TOWEL GREEN STERILE FF (TOWEL DISPOSABLE) ×4 IMPLANT
UNDERPAD 30X36 HEAVY ABSORB (UNDERPADS AND DIAPERS) ×2 IMPLANT

## 2022-05-02 NOTE — H&P (Signed)
[name] [name] is an 88 y.o. female.   Chief Complaint: carpal tunnel syndrome HPI: 88 y.o. yo female with numbness and tingling right hand and right long finger trigger digit.  Positive nerve conduction studies. She wishes to have right carpal tunnel release and long finger trigger release.  Allergies:  Allergies  Allergen Reactions   Alendronate Sodium Other (See Comments)    REACTION: JAW PAIN   Celecoxib Other (See Comments)    REACTION: GI UPSET    Chocolate Diarrhea   Omeprazole Nausea Only   Rabeprazole Nausea And Vomiting   Latex Itching and Rash   Sulfa Antibiotics Rash    Past Medical History:  Diagnosis Date   Alternating constipation and diarrhea    Anxiety state, unspecified    CHF (congestive heart failure)    Complication of anesthesia    slow to wake up with surgery several years ago    COVID-19 virus infection 12/2018   Depressive disorder, not elsewhere classified    Diaphragmatic hernia without mention of obstruction or gangrene    Difficulty sleeping    Diverticulosis of colon (without mention of hemorrhage)    Esophageal reflux    External hemorrhoids without mention of complication    Family history of adverse reaction to anesthesia    Dad very slow to wake upi   Fatigue    Frequency of urination    Gallstones    Headache    Helicobacter pylori (H. pylori)    Hiatal hernia    History of nuclear stress test    Myoview 05/2021: EF 70, normal perfusion; low risk   History of transfusion    Lumbar spondylosis    Macular degeneration    Macular degeneration (senile) of retina, unspecified    Osteoarthrosis, unspecified whether generalized or localized, unspecified site 08/11/2011   hx. "rhematoid arthritis", osteoarthritis, DDD, bursitis(hip)   Osteoporosis    Palpitations    PONV (postoperative nausea and vomiting)    Schatzki's ring    Shortness of breath 08/11/2011   with exertion only at present   Unspecified cerebral artery occlusion  with cerebral infarction 08/11/2011   '97/ '06( TIA)-affected lt. side, slight weakness L side   Unspecified vitamin D deficiency     Past Surgical History:  Procedure Laterality Date   ABDOMINAL HYSTERECTOMY  08-11-11   APPENDECTOMY     BACK SURGERY  08-11-11   '11-hx. lumbar fusion with retained hardware   CATARACT EXTRACTION  08-11-11   Bilateral   ESOPHAGEAL DILATION     ESOPHAGOGASTRODUODENOSCOPY (EGD) WITH PROPOFOL N/A 12/06/2021   Procedure: ESOPHAGOGASTRODUODENOSCOPY (EGD) WITH PROPOFOL;  Surgeon: Jenel Lucks, MD;  Location: [facility];  Service: Gastroenterology;  Laterality: N/A;   KNEE SURGERY  08-11-11   rt. knee scope   PATELLAR TENDON REPAIR Left 02/22/2014   Procedure: PATELLA TENDON REPAIR;  Surgeon: Loanne Drilling, MD;  Location: [facility];  Service: Orthopedics;  Laterality: Left;   PATELLAR TENDON REPAIR Left 06/25/2014   Procedure: LEFT PATELLA TENDON REPAIR;  Surgeon: Ollen Gross, MD;  Location: [facility];  Service: Orthopedics;  Laterality: Left;   SAVORY DILATION N/A 12/06/2021   Procedure: SAVORY DILATION;  Surgeon: Jenel Lucks, MD;  Location: [facility];  Service: Gastroenterology;  Laterality: N/A;   TOTAL KNEE ARTHROPLASTY  08/16/2011   Procedure: TOTAL KNEE ARTHROPLASTY;  Surgeon: Loanne Drilling, MD;  Location: [facility];  Service: Orthopedics;  Laterality: Right;   TOTAL KNEE ARTHROPLASTY Left 01/27/2014   Procedure: LEFT  TOTAL KNEE ARTHROPLASTY;  Surgeon: Loanne Drilling, MD;  Location: [facility];  Service: Orthopedics;  Laterality: Left;   TUBAL LIGATION      Family History: Family History  Problem Relation Age of Onset   Heart disease Father    Coronary artery disease Brother    Diabetes Brother    Heart disease Brother    Pancreatic cancer Daughter    Diabetes Sister    Colon cancer Neg Hx    Colon polyps Neg Hx    Esophageal cancer Neg Hx    Gallbladder disease Neg Hx     Social History:   reports that she has never smoked. She has  never used smokeless tobacco. She reports that she does not drink alcohol and does not use drugs.  Medications: Medications Prior to Admission  Medication Sig Dispense Refill   acetaminophen (TYLENOL) 500 MG tablet Take 500 mg by mouth every 6 (six) hours as needed.     APPLE CIDER VINEGAR PO Take 2 tablets by mouth at bedtime.     atorvastatin (LIPITOR) 10 MG tablet TAKE 1 TABLET DAILY 90 tablet 0   clopidogrel (PLAVIX) 75 MG tablet Take 1 tablet (75 mg total) by mouth daily. 90 tablet 3   diclofenac sodium (VOLTAREN) 1 % GEL Apply 1 application  topically at bedtime.     estradiol (ESTRACE) 0.1 MG/GM vaginal cream USE A PEA SIZED AMOUNT OF CREAM VAGINALLY TWICE PER WEEK 42.5 g 0   famotidine (PEPCID) 40 MG tablet TAKE 1 TABLET DAILY 90 tablet 0   furosemide (LASIX) 20 MG tablet TAKE 1 TABLET DAILY (Patient taking differently: Alternate 20 and 40 mg every other day) 90 tablet 2   gabapentin (NEURONTIN) 100 MG capsule TAKE 2 CAPSULES THREE TIMES A DAY 540 capsule 0   Liniments (ABSORBINE ARTHRITIS STRENGTH EX) Apply 1 application  topically daily as needed (Back pain).     loperamide (IMODIUM) 2 MG capsule Take 2 mg by mouth as needed for diarrhea or loose stools.     metoprolol succinate (TOPROL-XL) 25 MG 24 hr tablet TAKE ONE-HALF (1/2) TABLET AT BEDTIME 45 tablet 2   Multiple Vitamins-Minerals (PRESERVISION AREDS PO) Take 1 capsule by mouth 2 (two) times daily.     pantoprazole (PROTONIX) 40 MG tablet Take 1 tablet (40 mg total) by mouth 2 (two) times daily. 60 tablet 5   Polyethyl Glycol-Propyl Glycol (SYSTANE OP) Apply 1-2 drops to eye 3 (three) times daily as needed (dry eyes.).      sertraline (ZOLOFT) 100 MG tablet TAKE 2 TABLETS DAILY 180 tablet 0   buPROPion (WELLBUTRIN XL) 150 MG 24 hr tablet Take 1 tablet (150 mg total) by mouth daily. 90 tablet 0   calcium carbonate (TUMS - DOSED IN MG ELEMENTAL CALCIUM) 500 MG chewable tablet Chew 1 tablet (200 mg of elemental calcium total) by  mouth 4 (four) times daily as needed for indigestion or heartburn. 10 tablet 0   nitroGLYCERIN (NITROSTAT) 0.4 MG SL tablet Place 1 tablet (0.4 mg total) under the tongue every 5 (five) minutes as needed for chest pain. 25 tablet 3    No results found for this or any previous visit (from the past 48 hour(s)).  No results found.    Blood pressure 122/65, pulse 66, temperature 98.1 F (36.7 C), temperature source Oral, resp. rate 20, height 5' 5.5" (1.664 m), weight 87 kg, SpO2 98 %.  General appearance: alert, cooperative, and appears stated age Head: Normocephalic, without obvious abnormality,  atraumatic Neck: supple, symmetrical, trachea midline Extremities: Intact sensation and capillary refill all digits.  +epl/fpl/io.  No wounds.  Pulses: 2+ and symmetric Skin: Skin color, texture, turgor normal. No rashes or lesions Neurologic: Grossly normal Incision/Wound: none  Assessment/Plan Right carpal tunnel syndrome and long finger trigger digit.  Non operative and operative treatment options have been discussed with the patient and patient wishes to proceed with operative treatment. Risks, benefits, and alternatives of surgery have been discussed and the patient agrees with the plan of care.   Betha Loa 05/02/2022, 12:24 PM

## 2022-05-02 NOTE — Anesthesia Postprocedure Evaluation (Signed)
Anesthesia Post Note  Patient: [name] [name]  Procedure(s) Performed: RIGHT CARPAL TUNNEL RELEASE (Right: Hand) RELEASE TRIGGER FINGER/A-1 PULLEY RIGHT LONG FINGER (Right: Middle Finger)     Patient location during evaluation: PACU Anesthesia Type: Bier Block and MAC Level of consciousness: awake and alert and oriented Pain management: pain level controlled Vital Signs Assessment: post-procedure vital signs reviewed and stable Respiratory status: spontaneous breathing, nonlabored ventilation and respiratory function stable Cardiovascular status: stable and blood pressure returned to baseline Postop Assessment: no apparent nausea or vomiting Anesthetic complications: no   No notable events documented.  Last Vitals:  Vitals:   05/02/22 1316 05/02/22 1330  BP: 128/64 133/68  Pulse: (!) 59 (!) 58  Resp: 15 17  Temp:    SpO2: 92% 97%    Last Pain:  Vitals:   05/02/22 1330  TempSrc:   PainSc: 0-No pain                 Khushbu Pippen A.

## 2022-05-02 NOTE — Anesthesia Procedure Notes (Signed)
Anesthesia Regional Block: Bier block (IV Regional)   Pre-Anesthetic Checklist: , timeout performed,  Correct Patient, Correct Site, Correct Laterality,  Correct Procedure, Correct Position, site marked,  Risks and benefits discussed,  Surgical consent,  Pre-op evaluation,  At surgeon's request and post-op pain management  Laterality: Right          Procedures:,,,,, intact distal pulses, Esmarch exsanguination,  Single tourniquet utilized,  #20gu IV placed    Narrative:  CRNA: Devesh Monforte M, CRNA     

## 2022-05-02 NOTE — Discharge Instructions (Addendum)

## 2022-05-02 NOTE — Transfer of Care (Signed)
Immediate Anesthesia Transfer of Care Note  Patient: [name] [name]  Procedure(s) Performed: RIGHT CARPAL TUNNEL RELEASE (Right: Hand) RELEASE TRIGGER FINGER/A-1 PULLEY RIGHT LONG FINGER (Right: Middle Finger)  Patient Location: PACU  Anesthesia Type:MAC  Level of Consciousness: awake, alert , and oriented  Airway & Oxygen Therapy: Patient Spontanous Breathing and Patient connected to face mask oxygen  Post-op Assessment: Report given to RN and Post -op Vital signs reviewed and stable  Post vital signs: Reviewed and stable  Last Vitals:  Vitals Value Taken Time  BP    Temp 36.1 C 05/02/22 1315  Pulse 62 05/02/22 1315  Resp 10 05/02/22 1315  SpO2 96 % 05/02/22 1315    Last Pain:  Vitals:   05/02/22 1142  TempSrc: Oral  PainSc: 8       Patients Stated Pain Goal: 8 (05/02/22 1142)  Complications: No notable events documented.

## 2022-05-02 NOTE — Op Note (Signed)
05/02/2022 [facility]                              OPERATIVE REPORT   PREOPERATIVE DIAGNOSIS:   1. Right carpal tunnel syndrome 2. Right long finger trigger digit  POSTOPERATIVE DIAGNOSIS:   Right carpal tunnel syndrome Right long finger trigger digit  PROCEDURE:   Right carpal tunnel release Right long finger trigger release  SURGEON:  Betha Loa, MD  ASSISTANT:  none.  ANESTHESIA: Bier block with sedation  IV FLUIDS:  Per anesthesia flow sheet.  ESTIMATED BLOOD LOSS:  Minimal.  COMPLICATIONS:  None.  SPECIMENS:  None.  TOURNIQUET TIME:    Total Tourniquet Time Documented: Forearm (Right) - 27 minutes Total: Forearm (Right) - 27 minutes   DISPOSITION:  Stable to PACU.  LOCATION: [facility]  INDICATIONS:  88 y.o. yo female with numbness and tingling right hand and triggering of long finger.  Positive nerve conduction studies. She wishes to have right carpal tunnel release and long finger trigger release.  She wishes to have a carpal tunnel release for management of her symptoms.  Risks, benefits and alternatives of surgery were discussed including the risk of blood loss; infection; damage to nerves, vessels, tendons, ligaments, bone; failure of surgery; need for additional surgery; complications with wound healing; continued pain; recurrence of carpal tunnel syndrome; and damage to motor branch. She voiced understanding of these risks and elected to proceed.   OPERATIVE COURSE:  After being identified preoperatively by myself, the patient and I agreed upon the procedure and site of procedure.  The surgical site was marked.  Surgical consent had been signed.  She was given IV Ancef as preoperative antibiotic prophylaxis.  She was transferred to the operating room and placed on the operating room table in supine position with the right upper extremity on an armboard.  Bier block anesthesia was induced by the anesthesiologist.  Right upper  extremity was prepped and draped in normal sterile orthopaedic fashion.  A surgical pause was performed between the surgeons, anesthesia, and operating room staff, and all were in agreement as to the patient, procedure, and site of procedure.  Tourniquet at the proximal aspect of the forearm had been inflated for the Bier block.  An incision was made at the volar aspect of the MP joint of the long finger.  This was carried into the subcutaneous tissues by spreading technique.  The wound was injected with 0.25% plain marcaine.  Bipolar electrocautery was used to obtain hemostasis.  The radial and ulnar digital nerves were protected throughout the case. The flexor sheath was identified.  The A1 pulley was identified and sharply incised.  It was released in its entirety.  The proximal 1-2 mm of the A2 pulley was vented to allow better excursion of the tendons.  The finger was placed through a range of motion and there was noted to be no catching.  The tendons were brought through the wound and any adherences released.  The wound was then copiously irrigated with sterile saline. It was closed with 4-0 nylon in a horizontal mattress fashion.    Incision was made over the transverse carpal ligament and carried into the subcutaneous tissues by spreading technique.  Bipolar electrocautery was used to obtain hemostasis.  The palmar fascia was sharply incised.  The transverse carpal ligament was identified.  The fascia distal to the ligament was opened.  Retractor was placed and the  flexor tendons were identified.  The flexor tendon to the ring finger was identified and retracted radially.  The transverse carpal ligament was then incised from distal to proximal under direct visualization.  Scissors were used to split the distal aspect of the volar antebrachial fascia.  A finger was placed into the wound to ensure complete decompression, which was the case.  The nerve was examined.  It was adherent to the radial leaflet.  The  motor branch was identified and was intact.  The wound was copiously irrigated with sterile saline.  It was then closed with 4-0 nylon in a horizontal mattress fashion.  It was injected with 0.25% plain Marcaine to aid in postoperative analgesia.  It was dressed with sterile Xeroform, 4x4s, an ABD, and wrapped with Kerlix and an Ace bandage.  Tourniquet was deflated at 27 minutes.  Fingertips were pink with brisk capillary refill after deflation of the tourniquet.  Operative drapes were broken down.  The patient was awoken from anesthesia safely.  She was transferred back to stretcher and taken to the PACU in stable condition.  I will see her back in the office in 1 week for postoperative followup.  I will give her a prescription for Norco 5/325 1-2 tabs PO q6 hours prn pain, dispense # 15.    Betha Loa, MD Electronically signed, 05/02/22

## 2022-05-02 NOTE — Anesthesia Preprocedure Evaluation (Signed)
Anesthesia Evaluation  Patient identified by MRN, date of birth, ID band Patient awake    Reviewed: Allergy & Precautions, NPO status , Patient's Chart, lab work & pertinent test results, reviewed documented beta blocker date and time   History of Anesthesia Complications (+) PONV, Family history of anesthesia reaction and history of anesthetic complications  Airway Mallampati: I  TM Distance: >3 FB Neck ROM: Full    Dental  (+) Lower Dentures, Upper Dentures   Pulmonary shortness of breath and with exertion   Pulmonary exam normal breath sounds clear to auscultation       Cardiovascular + CAD and +CHF  Normal cardiovascular exam Rhythm:Regular Rate:Normal     Neuro/Psych  Headaches PSYCHIATRIC DISORDERS Anxiety Depression    Macular degeneration Right Carpal tunnel syndrome  Neuromuscular disease CVA, No Residual Symptoms    GI/Hepatic Neg liver ROS, hiatal hernia,GERD  Medicated,,Diverticular disease   Endo/Other  Obesity Hyperlipidemia  Renal/GU negative Renal ROS Bladder dysfunction  SUI    Musculoskeletal  (+) Arthritis , Osteoarthritis,  Osteoporosis Trigger finger right long finger   Abdominal  (+) + obese  Peds  Hematology negative hematology ROS (+) Plavix therapy- last dose 4/8   Anesthesia Other Findings   Reproductive/Obstetrics                              Anesthesia Physical Anesthesia Plan  ASA: 3  Anesthesia Plan: Bier Block and Bier Block-Lidocaine Only   Post-op Pain Management: Regional block* and Minimal or no pain anticipated   Induction: Intravenous  PONV Risk Score and Plan: 3 and Treatment may vary due to age or medical condition, Propofol infusion and Ondansetron  Airway Management Planned: Natural Airway and Simple Face Mask  Additional Equipment:   Intra-op Plan:   Post-operative Plan:   Informed Consent: I have reviewed the patients History  and Physical, chart, labs and discussed the procedure including the risks, benefits and alternatives for the proposed anesthesia with the patient or authorized representative who has indicated his/her understanding and acceptance.     Dental advisory given  Plan Discussed with:   Anesthesia Plan Comments:          Anesthesia Quick Evaluation

## 2022-05-03 ENCOUNTER — Encounter (HOSPITAL_BASED_OUTPATIENT_CLINIC_OR_DEPARTMENT_OTHER): Payer: Self-pay | Admitting: Orthopedic Surgery

## 2022-05-05 ENCOUNTER — Emergency Department (HOSPITAL_BASED_OUTPATIENT_CLINIC_OR_DEPARTMENT_OTHER): Payer: Medicare Other

## 2022-05-05 ENCOUNTER — Other Ambulatory Visit: Payer: Self-pay

## 2022-05-05 ENCOUNTER — Encounter (HOSPITAL_BASED_OUTPATIENT_CLINIC_OR_DEPARTMENT_OTHER): Payer: Self-pay | Admitting: Emergency Medicine

## 2022-05-05 ENCOUNTER — Emergency Department (HOSPITAL_BASED_OUTPATIENT_CLINIC_OR_DEPARTMENT_OTHER): Payer: Medicare Other | Admitting: Radiology

## 2022-05-05 ENCOUNTER — Emergency Department (HOSPITAL_BASED_OUTPATIENT_CLINIC_OR_DEPARTMENT_OTHER)
Admission: EM | Admit: 2022-05-05 | Discharge: 2022-05-06 | Disposition: A | Discharge status: Home / Self Care | Payer: Medicare Other | Attending: Emergency Medicine | Admitting: Emergency Medicine

## 2022-05-05 DIAGNOSIS — K449 Diaphragmatic hernia without obstruction or gangrene: Secondary | ICD-10-CM | POA: Diagnosis not present

## 2022-05-05 DIAGNOSIS — R0602 Shortness of breath: Secondary | ICD-10-CM | POA: Diagnosis not present

## 2022-05-05 DIAGNOSIS — R0789 Other chest pain: Secondary | ICD-10-CM | POA: Insufficient documentation

## 2022-05-05 DIAGNOSIS — Z91040 Latex allergy status: Secondary | ICD-10-CM | POA: Insufficient documentation

## 2022-05-05 DIAGNOSIS — Z7902 Long term (current) use of antithrombotics/antiplatelets: Secondary | ICD-10-CM | POA: Insufficient documentation

## 2022-05-05 DIAGNOSIS — Z8673 Personal history of transient ischemic attack (TIA), and cerebral infarction without residual deficits: Secondary | ICD-10-CM | POA: Insufficient documentation

## 2022-05-05 DIAGNOSIS — I10 Essential (primary) hypertension: Secondary | ICD-10-CM | POA: Diagnosis not present

## 2022-05-05 DIAGNOSIS — R079 Chest pain, unspecified: Secondary | ICD-10-CM | POA: Diagnosis not present

## 2022-05-05 LAB — URINALYSIS, ROUTINE W REFLEX MICROSCOPIC
Bacteria, UA: NONE SEEN
Bilirubin Urine: NEGATIVE
Glucose, UA: NEGATIVE mg/dL
Hgb urine dipstick: NEGATIVE
Ketones, ur: NEGATIVE mg/dL
Leukocytes,Ua: NEGATIVE
Nitrite: NEGATIVE
Specific Gravity, Urine: >1.046 — ABNORMAL HIGH (ref 1.005–1.030)
pH: 6 (ref 5.0–8.0)

## 2022-05-05 LAB — CBC
HCT: 38.9 % (ref 36.0–46.0)
Hemoglobin: 13 g/dL (ref 12.0–15.0)
MCH: 29.3 pg (ref 26.0–34.0)
MCHC: 33.4 g/dL (ref 30.0–36.0)
MCV: 87.8 fL (ref 80.0–100.0)
Platelets: 261 10*3/uL (ref 150–400)
RBC: 4.43 MIL/uL (ref 3.87–5.11)
RDW: 14 % (ref 11.5–15.5)
WBC: 5.8 10*3/uL (ref 4.0–10.5)
nRBC: 0 % (ref 0.0–0.2)

## 2022-05-05 LAB — HEPATIC FUNCTION PANEL
ALT: 8 U/L (ref 0–44)
AST: 15 U/L (ref 15–41)
Albumin: 4 g/dL (ref 3.5–5.0)
Alkaline Phosphatase: 85 U/L (ref 38–126)
Bilirubin, Direct: 0.1 mg/dL (ref 0.0–0.2)
Indirect Bilirubin: 0.3 mg/dL (ref 0.3–0.9)
Total Bilirubin: 0.4 mg/dL (ref 0.3–1.2)
Total Protein: 7.1 g/dL (ref 6.5–8.1)

## 2022-05-05 LAB — BASIC METABOLIC PANEL
Anion gap: 12 (ref 5–15)
BUN: 17 mg/dL (ref 8–23)
CO2: 26 mmol/L (ref 22–32)
Calcium: 9.5 mg/dL (ref 8.9–10.3)
Chloride: 102 mmol/L (ref 98–111)
Creatinine, Ser: 0.75 mg/dL (ref 0.44–1.00)
GFR, Estimated: >60 mL/min (ref >60)
Glucose, Bld: 105 mg/dL — ABNORMAL HIGH (ref 70–99)
Potassium: 3.7 mmol/L (ref 3.5–5.1)
Sodium: 140 mmol/L (ref 135–145)

## 2022-05-05 LAB — TROPONIN I (HIGH SENSITIVITY): Troponin I (High Sensitivity): 3 ng/L (ref <18)

## 2022-05-05 MED ORDER — OXYCODONE-ACETAMINOPHEN 5-325 MG PO TABS: 1.0000 | ORAL_TABLET | Freq: Four times a day (QID) | ORAL | 0 refills | Status: DC | PRN

## 2022-05-05 MED ORDER — HYDROMORPHONE HCL 1 MG/ML IJ SOLN
0.5000 mg | Freq: Once | INTRAMUSCULAR | Status: AC
  Administered 2022-05-05: 0.5 mg via INTRAVENOUS
  Filled 2022-05-05: qty 1

## 2022-05-05 MED ORDER — ONDANSETRON HCL 4 MG/2ML IJ SOLN
4.0000 mg | Freq: Once | INTRAMUSCULAR | Status: AC
  Administered 2022-05-05: 4 mg via INTRAVENOUS
  Filled 2022-05-05: qty 2

## 2022-05-05 MED ORDER — IOHEXOL 350 MG/ML SOLN
100.0000 mL | Freq: Once | INTRAVENOUS | Status: AC | PRN
  Administered 2022-05-05: 75 mL via INTRAVENOUS

## 2022-05-05 MED ORDER — DEXAMETHASONE SODIUM PHOSPHATE 10 MG/ML IJ SOLN
10.0000 mg | Freq: Once | INTRAMUSCULAR | Status: AC
  Administered 2022-05-05: 10 mg via INTRAVENOUS
  Filled 2022-05-05: qty 1

## 2022-05-05 MED ORDER — MORPHINE SULFATE (PF) 4 MG/ML IV SOLN
4.0000 mg | Freq: Once | INTRAVENOUS | Status: AC
  Administered 2022-05-05: 4 mg via INTRAVENOUS
  Filled 2022-05-05: qty 1

## 2022-05-05 NOTE — ED Notes (Signed)
Patient to CT via stretcher.

## 2022-05-05 NOTE — ED Triage Notes (Signed)
Chest pains left side into back with sob, started this morning and has been getting worse through the day. On plavix, recently held for carpal tunnel surgery.

## 2022-05-05 NOTE — ED Provider Notes (Signed)
[name]   CSN: [id number] Arrival date & time: 05/05/22  2137     History  Chief Complaint  Patient presents with   Chest Pain    [name] [name] is a 88 y.o. female.  Pt is a an 88 yo with pmhx significant for depression, anxiety, HLD, HTN, CVA, and GERD.  Pt had carpal tunnel surgery on Monday.  She developed left sided cp today.  She said it hurts to take a deep breath.  It does hurt to move.  She is on Plavix, but held it for a week prior to surgery.  She started taking it again yesterday.       Home Medications Prior to Admission medications   Medication Sig Start Date End Date Taking? Authorizing Provider  APPLE CIDER VINEGAR PO Take 2 tablets by mouth at bedtime.    [provider]  atorvastatin (LIPITOR) 10 MG tablet TAKE 1 TABLET DAILY 12/29/21   Tower, Audrie Gallus, MD  buPROPion (WELLBUTRIN XL) 150 MG 24 hr tablet Take 1 tablet (150 mg total) by mouth daily. 04/28/22   Tower, Audrie Gallus, MD  calcium carbonate (TUMS - DOSED IN MG ELEMENTAL CALCIUM) 500 MG chewable tablet Chew 1 tablet (200 mg of elemental calcium total) by mouth 4 (four) times daily as needed for indigestion or heartburn. 02/25/14   Perkins, Alexzandrew L, PA-C  clopidogrel (PLAVIX) 75 MG tablet Take 1 tablet (75 mg total) by mouth daily. 12/07/21   Jenel Lucks, MD  diclofenac sodium (VOLTAREN) 1 % GEL Apply 1 application  topically at bedtime. 12/22/15   [provider]  estradiol (ESTRACE) 0.1 MG/GM vaginal cream USE A PEA SIZED AMOUNT OF CREAM VAGINALLY TWICE PER WEEK 03/14/22   Tower, Idamae Schuller A, MD  famotidine (PEPCID) 40 MG tablet TAKE 1 TABLET DAILY 04/25/22   Tower, Audrie Gallus, MD  furosemide (LASIX) 20 MG tablet TAKE 1 TABLET DAILY Patient taking differently: Alternate 20 and 40 mg every other day 10/12/21   Tonny Bollman, MD  gabapentin (NEURONTIN) 100 MG capsule TAKE 2 CAPSULES THREE TIMES A DAY 12/30/21   Tower, Audrie Gallus, MD   HYDROcodone-acetaminophen (NORCO/VICODIN) 5-325 MG tablet 1-2 tabs PO q6 hours prn pain 05/02/22   Betha Loa, MD  Liniments (ABSORBINE ARTHRITIS STRENGTH EX) Apply 1 application  topically daily as needed (Back pain).    [provider]  loperamide (IMODIUM) 2 MG capsule Take 2 mg by mouth as needed for diarrhea or loose stools.    [provider]  metoprolol succinate (TOPROL-XL) 25 MG 24 hr tablet TAKE ONE-HALF (1/2) TABLET AT BEDTIME 08/17/21   Weaver, Scott T, PA-C  Multiple Vitamins-Minerals (PRESERVISION AREDS PO) Take 1 capsule by mouth 2 (two) times daily.    [provider]  nitroGLYCERIN (NITROSTAT) 0.4 MG SL tablet Place 1 tablet (0.4 mg total) under the tongue every 5 (five) minutes as needed for chest pain. 05/07/21 04/13/22  Tereso Newcomer T, PA-C  pantoprazole (PROTONIX) 40 MG tablet Take 1 tablet (40 mg total) by mouth 2 (two) times daily. 04/29/22   Unk Lightning, PA  Polyethyl Glycol-Propyl Glycol (SYSTANE OP) Apply 1-2 drops to eye 3 (three) times daily as needed (dry eyes.).     [provider]  sertraline (ZOLOFT) 100 MG tablet TAKE 2 TABLETS DAILY 04/19/22   Tower, Audrie Gallus, MD      Allergies    Alendronate sodium, Celecoxib, Chocolate, Omeprazole, Rabeprazole, Latex, and Sulfa  antibiotics    Review of Systems   Review of Systems  Cardiovascular:  Positive for chest pain.  All other systems reviewed and are negative.   Physical Exam Updated Vital Signs BP (!) 145/64   Pulse (!) 51   Temp 97.7 F (36.5 C) (Oral)   Resp 20   Ht 5\' 5"  (1.651 m)   Wt 87 kg   SpO2 97%   BMI 31.92 kg/m  Physical Exam Vitals and nursing note reviewed.  Constitutional:      Appearance: She is well-developed.  HENT:     Head: Normocephalic and atraumatic.  Eyes:     Extraocular Movements: Extraocular movements intact.     Pupils: Pupils are equal, round, and reactive to light.  Cardiovascular:     Rate and Rhythm: Normal rate and regular  rhythm.     Heart sounds: Normal heart sounds.  Pulmonary:     Effort: Pulmonary effort is normal.     Breath sounds: Normal breath sounds.  Abdominal:     General: Bowel sounds are normal.     Palpations: Abdomen is soft.  Musculoskeletal:        General: Normal range of motion.     Cervical back: Normal range of motion and neck supple.  Skin:    General: Skin is warm and dry.     Capillary Refill: Capillary refill takes less than 2 seconds.  Neurological:     General: No focal deficit present.     Mental Status: She is alert and oriented to person, place, and time.  Psychiatric:        Mood and Affect: Mood normal.        Behavior: Behavior normal.     ED Results / Procedures / Treatments   Labs (all labs ordered are listed, but only abnormal results are displayed) Labs Reviewed  BASIC METABOLIC PANEL - Abnormal; Notable for the following components:      Result Value   Glucose, Bld 105 (*)    All other components within normal limits  CBC  HEPATIC FUNCTION PANEL  URINALYSIS, ROUTINE W REFLEX MICROSCOPIC  TROPONIN I (HIGH SENSITIVITY)  TROPONIN I (HIGH SENSITIVITY)    EKG EKG Interpretation  Date/Time:  Thursday May 05 2022 21:45:13 EDT Ventricular Rate:  65 PR Interval:  154 QRS Duration: 76 QT Interval:  418 QTC Calculation: 434 R Axis:   75 Text Interpretation: Normal sinus rhythm Normal ECG When compared with ECG of 28-Feb-2022 16:06, PREVIOUS ECG IS PRESENT No significant change since last tracing Confirmed by Jacalyn Lefevre [phone]) on 05/05/2022 11:23:49 PM  Radiology CT Angio Chest PE W and/or Wo Contrast  Result Date: 05/05/2022 CLINICAL DATA:  Left chest pain, shortness of breath, on Plavix EXAM: CT ANGIOGRAPHY CHEST WITH CONTRAST TECHNIQUE: Multidetector CT imaging of the chest was performed using the standard protocol during bolus administration of intravenous contrast. Multiplanar CT image reconstructions and MIPs were obtained to evaluate the  vascular anatomy. RADIATION DOSE REDUCTION: This exam was performed according to the departmental dose-optimization program which includes automated exposure control, adjustment of the mA and/or kV according to patient size and/or use of iterative reconstruction technique. CONTRAST:  75mL OMNIPAQUE IOHEXOL 350 MG/ML SOLN COMPARISON:  09/24/2021 FINDINGS: Cardiovascular: Satisfactory opacification of the bilateral pulmonary arteries to the segmental level. No evidence of pulmonary embolism. Study is not tailored for evaluation of the thoracic aorta. No evidence of thoracic aortic aneurysm. Atherosclerotic calcifications of the aortic arch. The heart is top-normal in size.  No pericardial effusion. Mild coronary atherosclerosis of the LAD. Mediastinum/Nodes: No suspicious mediastinal lymphadenopathy. Visualized thyroid is unremarkable. Lungs/Pleura: Mild dependent atelectasis in the bilateral upper and lower lobes. Mild centrilobular emphysematous changes, upper lung predominant. Evaluation of the lung parenchyma is constrained by respiratory motion. Within that constraint, there are no suspicious pulmonary nodules. No focal consolidation. No pleural effusion or pneumothorax. Upper Abdomen: Visualized upper abdomen is grossly unremarkable, noting vascular calcifications and a moderate duodenal diverticulum. Musculoskeletal: Degenerative changes of the visualized thoracolumbar spine. Mild superior endplate compression fracture deformity at T11, unchanged. Review of the MIP images confirms the above findings. IMPRESSION: No evidence of pulmonary embolism. No acute cardiopulmonary disease. Aortic Atherosclerosis (ICD10-I70.0) and Emphysema (ICD10-J43.9). Electronically Signed   By: Charline Bills M.D.   On: 05/05/2022 23:05   DG Chest 2 View  Result Date: 05/05/2022 CLINICAL DATA:  Chest pain. Left-sided pain with shortness of breath. EXAM: CHEST - 2 VIEW COMPARISON:  Radiograph 02/28/2022 CT 09/24/2021 FINDINGS:  Heart is normal in size. Normal mediastinal contours. There is a retrocardiac hiatal hernia. There are mild defined subsegmental opacities in the mid lower lung zones, greatest at the left lung base. No pulmonary edema, pleural effusion or pneumothorax. Exaggerated thoracic kyphosis. Chronic compression deformity in the lower thoracic spine. IMPRESSION: 1. Mild ill-defined opacities in both mid lower lung zones that may represent atelectasis or infection. 2. Hiatal hernia. Electronically Signed   By: Narda Rutherford M.D.   On: 05/05/2022 22:17    Procedures Procedures    Medications Ordered in ED Medications  dexamethasone (DECADRON) injection 10 mg (has no administration in time range)  HYDROmorphone (DILAUDID) injection 0.5 mg (has no administration in time range)  morphine (PF) 4 MG/ML injection 4 mg (4 mg Intravenous Given 05/05/22 2235)  ondansetron (ZOFRAN) injection 4 mg (4 mg Intravenous Given 05/05/22 2235)  iohexol (OMNIPAQUE) 350 MG/ML injection 100 mL (75 mLs Intravenous Contrast Given 05/05/22 2249)    ED Course/ Medical Decision Making/ A&P                             Medical Decision Making Amount and/or Complexity of Data Reviewed Labs: ordered. Radiology: ordered.  Risk Prescription drug management.   This patient presents to the ED for concern of chest pain, this involves an extensive number of treatment options, and is a complaint that carries with it a high risk of complications and morbidity.  The differential diagnosis includes PE, PNA, cardiac, msk   Co morbidities that complicate the patient evaluation  depression, anxiety, HLD, HTN, CVA, and GERD   Additional history obtained:  Additional history obtained from epic chart review External records from outside source obtained and reviewed including family   Lab Tests:  I Ordered, and personally interpreted labs.  The pertinent results include:  cbc nl, bmp nl, lfts nl,    Imaging Studies  ordered:  I ordered imaging studies including cxr and ct chest  I independently visualized and interpreted imaging which showed  CXR: . Mild ill-defined opacities in both mid lower lung zones that may  represent atelectasis or infection.  2. Hiatal hernia.  CT chest: No evidence of pulmonary embolism.    No acute cardiopulmonary disease.    Aortic Atherosclerosis (ICD10-I70.0) and Emphysema (ICD10-J43.9).   I agree with the radiologist interpretation   Cardiac Monitoring:  The patient was maintained on a cardiac monitor.  I personally viewed and interpreted the cardiac monitored which showed an  underlying rhythm of: nsr   Medicines ordered and prescription drug management:  I ordered medication including morphine, decadron, dilaudid  for sx  Reevaluation of the patient after these medicines showed that the patient improved I have reviewed the patients home medicines and have made adjustments as needed   Test Considered:  ct   Problem List / ED Course:  CP:  pleuritic.  No PE.  If pt has no uti, she can go home with pain meds.  Doubt CAD.   Reevaluation:  After the interventions noted above, I reevaluated the patient and found that they have :improved   Social Determinants of Health:  Lives at home   Dispostion:  After consideration of the diagnostic results and the patients response to treatment, I feel that the patent would benefit from discharge with outpatient f/u.          Final Clinical Impression(s) / ED Diagnoses Final diagnoses:  Atypical chest pain    Rx / DC Orders ED Discharge Orders     None         Jacalyn Lefevre, MD 05/05/22 2327

## 2022-05-06 DIAGNOSIS — H353132 Nonexudative age-related macular degeneration, bilateral, intermediate dry stage: Secondary | ICD-10-CM | POA: Diagnosis not present

## 2022-05-06 LAB — TROPONIN I (HIGH SENSITIVITY): Troponin I (High Sensitivity): 3 ng/L (ref <18)

## 2022-05-06 NOTE — ED Provider Notes (Signed)
Care of the patient assumed at the change of shift. Here with pleuritic L chest/flank pain. Recent surgery. CTA is neg. Pending UA and delta trop at shift change.  Physical Exam  BP (!) 116/53   Pulse (!) 59   Temp 97.7 F (36.5 C) (Oral)   Resp 15   Ht  (1.651 m)   Wt 87 kg   SpO2 98%   BMI 31.92 kg/m   Physical Exam  Procedures  Procedures  ED Course / MDM   Clinical Course as of 05/06/22 0059  Thu May 05, 2022  2357 UA is concentrated but no infection. Repeat Trop is pending.  [CS]  Fri May 06, 2022  0058 Repeat Trop remains normal. Patient reports pain is well controlled. She is eager to go home. Discussed negative ED workup which gave her some reassurance. Recommend NSAIDs/APAP for pain and PCP follow up, RTED for any other concerns.   [CS]    Clinical Course User Index [CS] Pollyann Savoy, MD   Medical Decision Making Given presenting complaint, I considered that admission might be necessary. After review of results from ED lab and/or imaging studies, admission to the hospital is not indicated at this time.    Problems Addressed: Atypical chest pain: acute illness or injury  Amount and/or Complexity of Data Reviewed Labs: ordered. Decision-making details documented in ED Course.  Risk Prescription drug management. Decision regarding hospitalization.          Pollyann Savoy, MD 05/06/22 0100

## 2022-05-09 DIAGNOSIS — M65331 Trigger finger, right middle finger: Secondary | ICD-10-CM | POA: Diagnosis not present

## 2022-05-09 DIAGNOSIS — G5601 Carpal tunnel syndrome, right upper limb: Secondary | ICD-10-CM | POA: Diagnosis not present

## 2022-05-11 ENCOUNTER — Telehealth: Payer: Self-pay

## 2022-05-11 NOTE — Telephone Encounter (Signed)
     Patient  visit on 05/06/2022  at [facility] was for chest pain.  Have you been able to follow up with your primary care physician? Yes  The patient was or was not able to obtain any needed medicine or equipment. No medication prescribed.  Are there diet recommendations that you are having difficulty following? No  Patient expresses understanding of discharge instructions and education provided has no other needs at this time. Yes  Verified patient's mailing address to send [employer] transportation application.   Ayinde Swim [facility]  [facility] Population Health Community Resource Care Guide   [email or web address]  ?? [phone]   Website: [email or web address]  [email or web address]

## 2022-05-12 ENCOUNTER — Other Ambulatory Visit: Payer: Self-pay | Admitting: Physician Assistant

## 2022-05-16 DIAGNOSIS — M65331 Trigger finger, right middle finger: Secondary | ICD-10-CM | POA: Diagnosis not present

## 2022-05-16 DIAGNOSIS — G5601 Carpal tunnel syndrome, right upper limb: Secondary | ICD-10-CM | POA: Diagnosis not present

## 2022-05-17 ENCOUNTER — Ambulatory Visit (INDEPENDENT_AMBULATORY_CARE_PROVIDER_SITE_OTHER): Payer: Medicare Other

## 2022-05-17 VITALS — Ht 63.5 in | Wt 196.0 lb

## 2022-05-17 DIAGNOSIS — Z0000 Encounter for general adult medical examination without abnormal findings: Secondary | ICD-10-CM | POA: Diagnosis not present

## 2022-05-17 NOTE — Patient Instructions (Addendum)
[name] , Thank you for taking time to come for your Medicare Wellness Visit. I appreciate your ongoing commitment to your health goals. Please review the following plan we discussed and let me know if I can assist you in the future.   These are the goals we discussed:  Goals       Follow up with Primary Care Provider      Starting 02/09/2018, I will continue to take medications as prescribed and to keep appointments with PCP as scheduled.      Patient Stated (pt-stated)      Get lower back to get better        This is a list of the screening recommended for you and due dates:  Health Maintenance  Topic Date Due   DTaP/Tdap/Td vaccine (3 - Tdap) 06/12/2017   COVID-19 Vaccine (3 - Pfizer risk series) 02/04/2020   Mammogram  05/13/2031*   Flu Shot  08/18/2022   Medicare Annual Wellness Visit  05/17/2023   Pneumonia Vaccine  Completed   DEXA scan (bone density measurement)  Completed   Zoster (Shingles) Vaccine  Completed   HPV Vaccine  Aged Out  *Topic was postponed. The date shown is not the original due date.    Advanced directives: Please bring a copy of your health care power of attorney and living will to the office to be added to your chart at your convenience.   Conditions/risks identified: Aim for 30 minutes of exercise or brisk walking, 6-8 glasses of water, and 5 servings of fruits and vegetables each day.   Next appointment: Follow up in one year for your annual wellness visit 05/18/23 @ 1:00 televisit   Preventive Care 65 Years and Older, Female Preventive care refers to lifestyle choices and visits with your health care provider that can promote health and wellness. What does preventive care include? A yearly physical exam. This is also called an annual well check. Dental exams once or twice a year. Routine eye exams. Ask your health care provider how often you should have your eyes checked. Personal lifestyle choices, including: Daily care of your teeth  and gums. Regular physical activity. Eating a healthy diet. Avoiding tobacco and drug use. Limiting alcohol use. Practicing safe sex. Taking low-dose aspirin every day. Taking vitamin and mineral supplements as recommended by your health care provider. What happens during an annual well check? The services and screenings done by your health care provider during your annual well check will depend on your age, overall health, lifestyle risk factors, and family history of disease. Counseling  Your health care provider may ask you questions about your: Alcohol use. Tobacco use. Drug use. Emotional well-being. Home and relationship well-being. Sexual activity. Eating habits. History of falls. Memory and ability to understand (cognition). Work and work Astronomer. Reproductive health. Screening  You may have the following tests or measurements: Height, weight, and BMI. Blood pressure. Lipid and cholesterol levels. These may be checked every 5 years, or more frequently if you are over 42 years old. Skin check. Lung cancer screening. You may have this screening every year starting at age 65 if you have a 30-pack-year history of smoking and currently smoke or have quit within the past 15 years. Fecal occult blood test (FOBT) of the stool. You may have this test every year starting at age 5. Flexible sigmoidoscopy or colonoscopy. You may have a sigmoidoscopy every 5 years or a colonoscopy every 10 years starting at age 43. Hepatitis C blood test.  Hepatitis B blood test. Sexually transmitted disease (STD) testing. Diabetes screening. This is done by checking your blood sugar (glucose) after you have not eaten for a while (fasting). You may have this done every 1-3 years. Bone density scan. This is done to screen for osteoporosis. You may have this done starting at age 38. Mammogram. This may be done every 1-2 years. Talk to your health care provider about how often you should have regular  mammograms. Talk with your health care provider about your test results, treatment options, and if necessary, the need for more tests. Vaccines  Your health care provider may recommend certain vaccines, such as: Influenza vaccine. This is recommended every year. Tetanus, diphtheria, and acellular pertussis (Tdap, Td) vaccine. You may need a Td booster every 10 years. Zoster vaccine. You may need this after age 87. Pneumococcal 13-valent conjugate (PCV13) vaccine. One dose is recommended after age 36. Pneumococcal polysaccharide (PPSV23) vaccine. One dose is recommended after age 63. Talk to your health care provider about which screenings and vaccines you need and how often you need them. This information is not intended to replace advice given to you by your health care provider. Make sure you discuss any questions you have with your health care provider. Document Released: 01/30/2015 Document Revised: 09/23/2015 Document Reviewed: 11/04/2014 Elsevier Interactive Patient Education  2017 [employer] Prevention in the Home Falls can cause injuries. They can happen to people of all ages. There are many things you can do to make your home safe and to help prevent falls. What can I do on the outside of my home? Regularly fix the edges of walkways and driveways and fix any cracks. Remove anything that might make you trip as you walk through a door, such as a raised step or threshold. Trim any bushes or trees on the path to your home. Use bright outdoor lighting. Clear any walking paths of anything that might make someone trip, such as rocks or tools. Regularly check to see if handrails are loose or broken. Make sure that both sides of any steps have handrails. Any raised decks and porches should have guardrails on the edges. Have any leaves, snow, or ice cleared regularly. Use sand or salt on walking paths during winter. Clean up any spills in your garage right away. This includes oil  or grease spills. What can I do in the bathroom? Use night lights. Install grab bars by the toilet and in the tub and shower. Do not use towel bars as grab bars. Use non-skid mats or decals in the tub or shower. If you need to sit down in the shower, use a plastic, non-slip stool. Keep the floor dry. Clean up any water that spills on the floor as soon as it happens. Remove soap buildup in the tub or shower regularly. Attach bath mats securely with double-sided non-slip rug tape. Do not have throw rugs and other things on the floor that can make you trip. What can I do in the bedroom? Use night lights. Make sure that you have a light by your bed that is easy to reach. Do not use any sheets or blankets that are too big for your bed. They should not hang down onto the floor. Have a firm chair that has side arms. You can use this for support while you get dressed. Do not have throw rugs and other things on the floor that can make you trip. What can I do in the kitchen? Clean up  any spills right away. Avoid walking on wet floors. Keep items that you use a lot in easy-to-reach places. If you need to reach something above you, use a strong step stool that has a grab bar. Keep electrical cords out of the way. Do not use floor polish or wax that makes floors slippery. If you must use wax, use non-skid floor wax. Do not have throw rugs and other things on the floor that can make you trip. What can I do with my stairs? Do not leave any items on the stairs. Make sure that there are handrails on both sides of the stairs and use them. Fix handrails that are broken or loose. Make sure that handrails are as long as the stairways. Check any carpeting to make sure that it is firmly attached to the stairs. Fix any carpet that is loose or worn. Avoid having throw rugs at the top or bottom of the stairs. If you do have throw rugs, attach them to the floor with carpet tape. Make sure that you have a light  switch at the top of the stairs and the bottom of the stairs. If you do not have them, ask someone to add them for you. What else can I do to help prevent falls? Wear shoes that: Do not have high heels. Have rubber bottoms. Are comfortable and fit you well. Are closed at the toe. Do not wear sandals. If you use a stepladder: Make sure that it is fully opened. Do not climb a closed stepladder. Make sure that both sides of the stepladder are locked into place. Ask someone to hold it for you, if possible. Clearly mark and make sure that you can see: Any grab bars or handrails. First and last steps. Where the edge of each step is. Use tools that help you move around (mobility aids) if they are needed. These include: Canes. Walkers. Scooters. Crutches. Turn on the lights when you go into a dark area. Replace any light bulbs as soon as they burn out. Set up your furniture so you have a clear path. Avoid moving your furniture around. If any of your floors are uneven, fix them. If there are any pets around you, be aware of where they are. Review your medicines with your doctor. Some medicines can make you feel dizzy. This can increase your chance of falling. Ask your doctor what other things that you can do to help prevent falls. This information is not intended to replace advice given to you by your health care provider. Make sure you discuss any questions you have with your health care provider. Document Released: 10/30/2008 Document Revised: 06/11/2015 Document Reviewed: 02/07/2014 Elsevier Interactive Patient Education  2017 [employer].

## 2022-05-17 NOTE — Progress Notes (Addendum)
I connected with  Sable Knoles Giovannetti on 05/17/22 by a audio enabled telemedicine application and verified that I am speaking with the correct person using two identifiers.  Patient Location: Home  Provider Location: Office/Clinic  I discussed the limitations of evaluation and management by telemedicine. The patient expressed understanding and agreed to proceed.  Subjective:   SHERYLANN VANGORDEN is a 88 y.o. female who presents for Medicare Annual (Subsequent) preventive examination.  Review of Systems     Cardiac Risk Factors include: advanced age (>62men, >77 women);sedentary lifestyle     Objective:    Today's Vitals   05/17/22 1306 05/17/22 1339  PainSc: 10-Worst pain ever 10-Worst pain ever   There is no height or weight on file to calculate BMI.     05/17/2022    1:23 PM 05/02/2022   11:40 AM 02/28/2022    3:32 PM 12/06/2021   10:00 AM 11/25/2021    3:01 PM 09/24/2021    8:05 AM 09/24/2021    7:53 AM  Advanced Directives  Does Patient Have a Medical Advance Directive? Yes Yes Yes Yes Yes Yes No  Type of Estate agent of [place];Living will Healthcare Power of [place];Living will Living will;Healthcare Power of [employer] Power of [place];Living will Living will;Healthcare Power of Attorney    Does patient want to make changes to medical advance directive?     No - Patient declined    Copy of Healthcare Power of Attorney in Chart?    No - copy requested No - copy requested    Would patient like information on creating a medical advance directive?  No - Patient declined         Current Medications (verified) Outpatient Encounter Medications as of 05/17/2022  Medication Sig   APPLE CIDER VINEGAR PO Take 2 tablets by mouth at bedtime.   atorvastatin (LIPITOR) 10 MG tablet TAKE 1 TABLET DAILY   buPROPion (WELLBUTRIN XL) 150 MG 24 hr tablet Take 1 tablet (150 mg total) by mouth daily.   calcium carbonate (TUMS - DOSED IN MG ELEMENTAL CALCIUM)  500 MG chewable tablet Chew 1 tablet (200 mg of elemental calcium total) by mouth 4 (four) times daily as needed for indigestion or heartburn.   clopidogrel (PLAVIX) 75 MG tablet Take 1 tablet (75 mg total) by mouth daily.   diclofenac sodium (VOLTAREN) 1 % GEL Apply 1 application  topically at bedtime.   estradiol (ESTRACE) 0.1 MG/GM vaginal cream USE A PEA SIZED AMOUNT OF CREAM VAGINALLY TWICE PER WEEK   famotidine (PEPCID) 40 MG tablet TAKE 1 TABLET DAILY   furosemide (LASIX) 20 MG tablet TAKE 1 TABLET DAILY (Patient taking differently: Alternate 20 and 40 mg every other day)   gabapentin (NEURONTIN) 100 MG capsule TAKE 2 CAPSULES THREE TIMES A DAY   HYDROcodone-acetaminophen (NORCO/VICODIN) 5-325 MG tablet 1-2 tabs PO q6 hours prn pain   loperamide (IMODIUM) 2 MG capsule Take 2 mg by mouth as needed for diarrhea or loose stools.   metoprolol succinate (TOPROL-XL) 25 MG 24 hr tablet TAKE ONE-HALF (1/2) TABLET AT BEDTIME   Multiple Vitamins-Minerals (PRESERVISION AREDS PO) Take 1 capsule by mouth 2 (two) times daily.   pantoprazole (PROTONIX) 40 MG tablet Take 1 tablet (40 mg total) by mouth 2 (two) times daily.   Polyethyl Glycol-Propyl Glycol (SYSTANE OP) Apply 1-2 drops to eye 3 (three) times daily as needed (dry eyes.).    sertraline (ZOLOFT) 100 MG tablet TAKE 2 TABLETS DAILY  Liniments (ABSORBINE ARTHRITIS STRENGTH EX) Apply 1 application  topically daily as needed (Back pain). (Patient not taking: Reported on 05/17/2022)   nitroGLYCERIN (NITROSTAT) 0.4 MG SL tablet Place 1 tablet (0.4 mg total) under the tongue every 5 (five) minutes as needed for chest pain.   oxyCODONE-acetaminophen (PERCOCET/ROXICET) 5-325 MG tablet Take 1 tablet by mouth every 6 (six) hours as needed for severe pain.   No facility-administered encounter medications on file as of 05/17/2022.    Allergies (verified) Alendronate sodium, Celecoxib, Chocolate, Omeprazole, Rabeprazole, Latex, and Sulfa antibiotics    History: Past Medical History:  Diagnosis Date   Alternating constipation and diarrhea    Anxiety state, unspecified    CHF (congestive heart failure) (HCC)    Complication of anesthesia    slow to wake up with surgery several years ago    COVID-19 virus infection 12/2018   Depressive disorder, not elsewhere classified    Diaphragmatic hernia without mention of obstruction or gangrene    Difficulty sleeping    Diverticulosis of colon (without mention of hemorrhage)    Esophageal reflux    External hemorrhoids without mention of complication    Family history of adverse reaction to anesthesia    Dad very slow to wake upi   Fatigue    Frequency of urination    Gallstones    Headache    Helicobacter pylori (H. pylori)    Hiatal hernia    History of nuclear stress test    Myoview 05/2021: EF 70, normal perfusion; low risk   History of transfusion    Lumbar spondylosis    Macular degeneration    Macular degeneration (senile) of retina, unspecified    Osteoarthrosis, unspecified whether generalized or localized, unspecified site 08/11/2011   hx. "rhematoid arthritis", osteoarthritis, DDD, bursitis(hip)   Osteoporosis    Palpitations    PONV (postoperative nausea and vomiting)    Schatzki's ring    Shortness of breath 08/11/2011   with exertion only at present   Unspecified cerebral artery occlusion with cerebral infarction 08/11/2011   '97/ '06( TIA)-affected lt. side, slight weakness L side   Unspecified vitamin D deficiency    Past Surgical History:  Procedure Laterality Date   ABDOMINAL HYSTERECTOMY  08-11-11   APPENDECTOMY     BACK SURGERY  08-11-11   '11-hx. lumbar fusion with retained hardware   CARPAL TUNNEL RELEASE Right 05/02/2022   Procedure: RIGHT CARPAL TUNNEL RELEASE;  Surgeon: Betha Loa, MD;  Location:  SURGERY CENTER;  Service: Orthopedics;  Laterality: Right;  Bier block   CATARACT EXTRACTION  08-11-11   Bilateral   ESOPHAGEAL DILATION      ESOPHAGOGASTRODUODENOSCOPY (EGD) WITH PROPOFOL N/A 12/06/2021   Procedure: ESOPHAGOGASTRODUODENOSCOPY (EGD) WITH PROPOFOL;  Surgeon: Jenel Lucks, MD;  Location: [facility];  Service: Gastroenterology;  Laterality: N/A;   KNEE SURGERY  08-11-11   rt. knee scope   PATELLAR TENDON REPAIR Left 02/22/2014   Procedure: PATELLA TENDON REPAIR;  Surgeon: Loanne Drilling, MD;  Location: [facility];  Service: Orthopedics;  Laterality: Left;   PATELLAR TENDON REPAIR Left 06/25/2014   Procedure: LEFT PATELLA TENDON REPAIR;  Surgeon: Ollen Gross, MD;  Location: [facility];  Service: Orthopedics;  Laterality: Left;   SAVORY DILATION N/A 12/06/2021   Procedure: SAVORY DILATION;  Surgeon: Jenel Lucks, MD;  Location: [facility];  Service: Gastroenterology;  Laterality: N/A;   TOTAL KNEE ARTHROPLASTY  08/16/2011   Procedure: TOTAL KNEE ARTHROPLASTY;  Surgeon: Loanne Drilling, MD;  Location: [facility];  Service: Orthopedics;  Laterality: Right;   TOTAL KNEE ARTHROPLASTY Left 01/27/2014   Procedure: LEFT TOTAL KNEE ARTHROPLASTY;  Surgeon: Loanne Drilling, MD;  Location: [facility];  Service: Orthopedics;  Laterality: Left;   TRIGGER FINGER RELEASE Right 05/02/2022   Procedure: RELEASE TRIGGER FINGER/A-1 PULLEY RIGHT LONG FINGER;  Surgeon: Betha Loa, MD;  Location: [facility];  Service: Orthopedics;  Laterality: Right;  Bier block   TUBAL LIGATION     Family History  Problem Relation Age of Onset   Heart disease Father    Coronary artery disease Brother    Diabetes Brother    Heart disease Brother    Pancreatic cancer Daughter    Diabetes Sister    Colon cancer Neg Hx    Colon polyps Neg Hx    Esophageal cancer Neg Hx    Gallbladder disease Neg Hx    Social History   Socioeconomic History   Marital status: Widowed    Spouse name: Not on file   Number of children: 3   Years of education: Not on file   Highest education level: Not on file  Occupational History   Occupation: disabled     Employer: RETIRED  Tobacco Use   Smoking status: Never   Smokeless tobacco: Never  Vaping Use   Vaping Use: Never used  Substance and Sexual Activity   Alcohol use: No    Alcohol/week: 0.0 standard drinks of alcohol   Drug use: No   Sexual activity: Not Currently  Other Topics Concern   Not on file  Social History Narrative   Not on file   Social Determinants of Health   Financial Resource Strain: Low Risk  (05/17/2022)   Overall Financial Resource Strain (CARDIA)    Difficulty of Paying Living Expenses: Not hard at all  Food Insecurity: No Food Insecurity (05/17/2022)   Hunger Vital Sign    Worried About Running Out of Food in the Last Year: Never true    Ran Out of Food in the Last Year: Never true  Transportation Needs: No Transportation Needs (05/17/2022)   PRAPARE - Administrator, Civil Service (Medical): No    Lack of Transportation (Non-Medical): No  Physical Activity: Inactive (05/17/2022)   Exercise Vital Sign    Days of Exercise per Week: 0 days    Minutes of Exercise per Session: 0 min  Stress: Stress Concern Present (05/17/2022)   [employer] of Occupational Health - Occupational Stress Questionnaire    Feeling of Stress : Very much  Social Connections: Moderately Integrated (05/17/2022)   Social Connection and Isolation Panel [NHANES]    Frequency of Communication with Friends and Family: More than three times a week    Frequency of Social Gatherings with Friends and Family: Three times a week    Attends Religious Services: More than 4 times per year    Active Member of Clubs or Organizations: Yes    Attends Banker Meetings: More than 4 times per year    Marital Status: Widowed    Tobacco Counseling Counseling given: Not Answered   Clinical Intake:  Pre-visit preparation completed: Yes  Pain : 0-10 Pain Score: 10-Worst pain ever Pain Type: Chronic pain Pain Location: Generalized Pain Descriptors / Indicators:  Constant Pain Onset: More than a month ago Pain Frequency: Constant     Nutritional Risks: Nausea/ vomitting/ diarrhea Diabetes: No  How often do you need to have someone help you when you  read instructions, pamphlets, or other written materials from your doctor or pharmacy?: 1 - Never  Diabetic:No  Interpreter Needed?: No  Comments: lives with son Information entered by :: P. Foy CMA   Activities of Daily Living    05/17/2022    1:28 PM 05/17/2022   11:38 AM  In your present state of health, do you have any difficulty performing the following activities:  Hearing? 0 1  Comment  diffuculty hearing with aids in due to back ground noises  Vision? 0 1  Comment  macular degeneration  Difficulty concentrating or making decisions? 0 1  Comment  occasional forget  Walking or climbing stairs? 1 1  Comment  uses walker  Dressing or bathing? 1 0  Doing errands, shopping? 1 1  Comment  son transports  Quarry manager and eating ? Y N  Using the Toilet? Y N  In the past six months, have you accidently leaked urine? Y Y  Comment incontienet   Do you have problems with loss of bowel control? N N  Comment  wears depends occasional loose stools  Managing your Medications? Y N  Managing your Finances? N N  Housekeeping or managing your Housekeeping? Malvin Johns  Comment  caregiver    Patient Care Team: Tower, Audrie Gallus, MD as PCP - Jerelene Redden, MD as PCP - Cardiology (Cardiology) Nelson Chimes, MD as Consulting Physician (Ophthalmology) Trey Sailors, MD as Attending Physician (Neurosurgery) Cindee Salt, MD as Consulting Physician (Orthopedic Surgery) Ollen Gross, MD as Consulting Physician (Orthopedic Surgery) Kalman Shan, MD as Consulting Physician (Pulmonary Disease) Alberteen Spindle, DDS as Referring Physician (Dentistry) Kathyrn Sheriff, [facility] as Pharmacist (Pharmacist)  Indicate any recent Medical Services you may have received from other than [facility] providers in  the past year (date may be approximate).     Assessment:   This is a routine wellness examination for [place].  Hearing/Vision screen Hearing Screening - Comments:: Hearing aids Vision Screening - Comments:: Wears glasses- Dr. Rachael Darby office  Dietary issues and exercise activities discussed: Current Exercise Habits: The patient does not participate in regular exercise at present, Exercise limited by: orthopedic condition(s)   Goals Addressed               This Visit's Progress     Patient Stated (pt-stated)        Get lower back to get better       Depression Screen    05/17/2022    1:45 PM 05/17/2022    1:21 PM 05/12/2021   11:34 AM 05/11/2020   10:56 AM 04/29/2019   11:27 AM 02/09/2018    1:39 PM 02/08/2017   12:53 PM  PHQ 2/9 Scores  PHQ - 2 Score 4 6 0 0 0 2 6  PHQ- 9 Score 10 6    4 18     Fall Risk    05/17/2022    1:26 PM 05/17/2022   11:38 AM 05/12/2021   11:34 AM 05/11/2020   10:56 AM 04/29/2019   11:21 AM  Fall Risk   Falls in the past year? 1 1 0 0 0  Number falls in past yr: 0 1   0  Injury with Fall? 1 0     Comment cracked stenum had x ray experienced chest discomfort no fracture found     Risk for fall due to : No Fall Risks Impaired mobility;Impaired balance/gait     Risk for fall due to: Comment  uses walker  Follow up Falls prevention discussed;Education provided Education provided;Falls evaluation completed;Falls prevention discussed Falls evaluation completed  Falls evaluation completed    FALL RISK PREVENTION PERTAINING TO THE HOME:  Any stairs in or around the home? No  If so, are there any without handrails? No  Home free of loose throw rugs in walkways, pet beds, electrical cords, etc? No  Adequate lighting in your home to reduce risk of falls? Yes   ASSISTIVE DEVICES UTILIZED TO PREVENT FALLS:  Life alert? No  Use of a cane, walker or w/c? Yes  Grab bars in the bathroom? Yes  Shower chair or bench in shower? Yes  Elevated toilet seat  or a handicapped toilet? Yes   Cognitive Function:    02/09/2018    1:39 PM 02/08/2017   12:53 PM 12/14/2015   11:09 AM  MMSE - Mini Mental State Exam  Orientation to time 5 5 5   Orientation to Place 5 5 5   Registration 3 3 3   Attention/ Calculation 0 0 0  Recall 3 3 3   Language- name 2 objects 0 0 0  Language- repeat 1 1 1   Language- follow 3 step command 3 3 3   Language- read & follow direction 0 0 0  Write a sentence 0 0 0  Copy design 0 0 0  Total score 20 20 20         05/17/2022    1:31 PM  6CIT Screen  What Year? 0 points  What month? 0 points  What time? 0 points  Count back from 20 0 points  Months in reverse 4 points  Repeat phrase 0 points  Total Score 4 points    Immunizations Immunization History  Administered Date(s) Administered   Influenza Split 10/17/2011   Influenza Whole 10/24/2003, 10/19/2006, 10/15/2007, 11/15/2008, 10/17/2009   Influenza, High Dose Seasonal PF 10/25/2016, 09/28/2017, 12/24/2021   Influenza, Seasonal, Injecte, Preservative Fre 09/26/2018   Influenza,inj,Quad PF,6+ Mos 09/19/2012, 11/26/2013   Influenza-Unspecified 11/10/2014, 09/18/2015   PFIZER(Purple Top)SARS-COV-2 Vaccination 12/17/2019, 01/07/2020   Pneumococcal Conjugate-13 05/07/2013   Pneumococcal Polysaccharide-23 01/03/2002, 06/13/2007   Td 10/09/1996, 06/13/2007   Zoster Recombinat (Shingrix) 09/26/2018, 12/19/2018   Zoster, Live 03/21/2013    TDAP status: Due, Education has been provided regarding the importance of this vaccine. Advised may receive this vaccine at local pharmacy or Health Dept. Aware to provide a copy of the vaccination record if obtained from local pharmacy or Health Dept. Verbalized acceptance and understanding.  Flu Vaccine status: Up to date  Pneumococcal vaccine status: Up to date  Covid-19 vaccine status: Declined, Education has been provided regarding the importance of this vaccine but patient still declined. Advised may receive this  vaccine at local pharmacy or Health Dept.or vaccine clinic. Aware to provide a copy of the vaccination record if obtained from local pharmacy or Health Dept. Verbalized acceptance and understanding.  Qualifies for Shingles Vaccine? Yes   Zostavax completed Yes   Shingrix Completed?: Yes  Screening Tests Health Maintenance  Topic Date Due   DTaP/Tdap/Td (3 - Tdap) 06/12/2017   COVID-19 Vaccine (3 - Pfizer risk series) 02/04/2020   MAMMOGRAM  05/13/2031 (Originally 10/10/2020)   INFLUENZA VACCINE  08/18/2022   Medicare Annual Wellness (AWV)  05/17/2023   Pneumonia Vaccine 24+ Years old  Completed   DEXA SCAN  Completed   Zoster Vaccines- Shingrix  Completed   HPV VACCINES  Aged Out    Health Maintenance  Health Maintenance Due  Topic Date Due   DTaP/Tdap/Td (3 -  Tdap) 06/12/2017   COVID-19 Vaccine (3 - Pfizer risk series) 02/04/2020    Colorectal cancer screening: Type of screening: FOBT/FIT. Completed 09/29/21. Repeat every 1 years patient request  Mammogram status: No longer required due to age.  Bone Density status: Completed 10/11/19. Results reflect: Bone density results: OSTEOPOROSIS. Repeat every 2 years.   Lung Cancer Screening: (Low Dose CT Chest recommended if Age 61-80 years, 30 pack-year currently smoking OR have quit w/in 15years.) does not qualify.   Lung Cancer Screening Referral:  Additional Screening:  Hepatitis C Screening: does not qualify; Completed   Vision Screening: Recommended annual ophthalmology exams for early detection of glaucoma and other disorders of the eye. Is the patient up to date with their annual eye exam?   unknown Who is the provider or what is the name of the office in which the patient attends annual eye exams? unknown If pt is not established with a provider, would they like to be referred to a provider to establish care? Yes .   Dental Screening: Recommended annual dental exams for proper oral hygiene  Community Resource Referral  / Chronic Care Management: CRR required this visit?  No   CCM required this visit?  No      Plan:     I have personally reviewed and noted the following in the patient's chart:   Medical and social history Use of alcohol, tobacco or illicit drugs  Current medications and supplements including opioid prescriptions. Patient is currently taking opioid prescriptions. Information provided to patient regarding non-opioid alternatives. Patient advised to discuss non-opioid treatment plan with their provider. Functional ability and status Nutritional status Physical activity Advanced directives List of other physicians Hospitalizations, surgeries, and ER visits in previous 12 months Vitals Screenings to include cognitive, depression, and falls Referrals and appointments  In addition, I have reviewed and discussed with patient certain preventive protocols, quality metrics, and best practice recommendations. A written personalized care plan for preventive services as well as general preventive health recommendations were provided to patient.     Maryan Puls, LPN   1/61/0960   Nurse Notes: Patent declined mammogram but would like to discuss having FOBT performed this year again. Patient to discuss with PCP.

## 2022-05-25 ENCOUNTER — Encounter (HOSPITAL_COMMUNITY)
Admission: RE | Disposition: A | Discharge status: Home / Self Care | Payer: Self-pay | Source: Home / Self Care | Attending: Gastroenterology

## 2022-05-25 ENCOUNTER — Ambulatory Visit (HOSPITAL_COMMUNITY)
Admission: RE | Admit: 2022-05-25 | Discharge: 2022-05-25 | Disposition: A | Discharge status: Home / Self Care | Payer: Medicare Other | Attending: Gastroenterology | Admitting: Gastroenterology

## 2022-05-25 DIAGNOSIS — R1310 Dysphagia, unspecified: Secondary | ICD-10-CM | POA: Insufficient documentation

## 2022-05-25 DIAGNOSIS — Z5309 Procedure and treatment not carried out because of other contraindication: Secondary | ICD-10-CM | POA: Insufficient documentation

## 2022-05-25 SURGERY — Surgical Case
Anesthesia: *Unknown

## 2022-05-25 SURGERY — INVASIVE LAB ABORTED CASE
Anesthesia: Choice

## 2022-05-25 MED ORDER — LIDOCAINE VISCOUS HCL 2 % MT SOLN
OROMUCOSAL | Status: AC
  Filled 2022-05-25: qty 15

## 2022-05-25 SURGICAL SUPPLY — 2 items
FACESHIELD LNG OPTICON STERILE (SAFETY) IMPLANT
GLOVE BIO SURGEON STRL SZ8 (GLOVE) ×2 IMPLANT

## 2022-05-25 NOTE — Progress Notes (Signed)
Esophageal manometry attempted per protocol without complications.  Patient unable to tolerated probe in esophagus. MD Notified. Patient verbalized understanding office will contact to discuss options.

## 2022-05-30 ENCOUNTER — Encounter: Payer: Self-pay | Admitting: Family Medicine

## 2022-05-30 ENCOUNTER — Ambulatory Visit (INDEPENDENT_AMBULATORY_CARE_PROVIDER_SITE_OTHER): Payer: Medicare Other | Admitting: Family Medicine

## 2022-05-30 VITALS — BP 130/72 | HR 64 | Temp 97.6°F | Ht 63.5 in | Wt 194.2 lb

## 2022-05-30 DIAGNOSIS — F4323 Adjustment disorder with mixed anxiety and depressed mood: Secondary | ICD-10-CM

## 2022-05-30 NOTE — Patient Instructions (Addendum)
I want to give the wellbutrin another 2 weeks to work  Try switch dose to am instead of afternoon  If not improving then let us know / we may go up on the dose   Socialize when you can  Talk to people on the phone   Try to do things you enjoy  I hope you can get back to gardening when your hand improves  Stay connected  Read  Get outside when you can   You can call and schedule some counseling when you are ready

## 2022-05-30 NOTE — Assessment & Plan Note (Signed)
Pt was started on wellbutrin xl 150 mg daily 3 weeks ago Already takes sertraline 200 mg daily (divided dose) She has not noted much of a difference but also no side effects Encouraged her to try to change wellbutrin dose time to am  Update [place] in 2 wk If no improvement can consider stopping it or increase dose  Today mood is fairly good Reviewed stressors/ coping techniques/symptoms/ support sources/ tx options and side effects in detail today Chronic pain is a big trigger/ frustrated about losing independence Of note she remains motivated Optometrist on the phone and social media  Gets outside  Reads and keeps busy In fact too busy to schedule counseling appt (but plans to do so ) Has annual exam in June -will touch base with her then as well

## 2022-05-30 NOTE — Progress Notes (Signed)
Subjective:    Patient ID: [name] [name], female    DOB: Jun 21, 1934, 88 y.o.   MRN: [id number]  HPI Pt presents for follow up of depression   Wt Readings from Last 3 Encounters:  05/30/22 194 lb 4 oz (88.1 kg)  05/17/22 196 lb (88.9 kg)  05/05/22 191 lb 12.8 oz (87 kg)   33.87 kg/m  Vitals:   05/30/22 1115  BP: 130/72  Pulse: 64  Temp: 97.6 F (36.4 C)  SpO2: 99%     Pt had visit with pharmacist and noted increase in crying spells and fatigue and trouble concentrating     On ssri - zoloft 200 mg daily  Decided to add on bupropion xl 150 mg daily - started about 3 weeks ago  Also recommended she call behavioral health for virtual appt   Not much change in mood yet No side effects   Severe chronic back pain and this plays a big role  Sees ortho  Had carpal tunnel surgery- that went well   Finds herself feeling down frequently    She calls someone and talks and this helps  A lot going on with family / health problems and losses   She really wants to plant her flowers but is waiting for her hand to improve more   Enjoys her I pad  Is home a lot  Tries to keep her brain busy   Thinks she is ok   Not doing any counseling / has not had time in the past 6 months to schedule that  Stays very busy (which she does like)   Patient Active Problem List   Diagnosis Date Noted   Atelectasis 12/29/2021   Right shoulder pain 12/28/2021   Esophageal dysphagia 12/06/2021   Dark stools 09/29/2021   Current use of proton pump inhibitor 05/12/2021   Palpitation 05/12/2021   Precordial chest pain 05/07/2021   Aortic atherosclerosis (HCC) 05/07/2021   Coronary artery calcification seen on CT scan 05/07/2021   Acute cystitis with hematuria 03/02/2021   Myalgia 12/09/2019   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 12/09/2019   Chronic tension-type headache, not intractable 09/06/2019   Ageusia 09/06/2019   Anosmia 09/06/2019   Trigeminal neuralgia  03/15/2019   Mobility impaired 12/11/2018   Poor balance 08/13/2018   Memory loss 06/27/2018   Physical deconditioning 03/19/2018   Chronic heart failure with preserved ejection fraction (HCC) 02/09/2018   Prediabetes 01/26/2017   Hearing loss 01/19/2016   Carpal tunnel syndrome 08/31/2015   Encounter for Medicare annual wellness exam 06/04/2014   Obesity 06/04/2014   Estrogen deficiency 06/04/2014   Colon cancer screening 06/04/2014   Schatzki's ring 03/27/2014   OA (osteoarthritis) of knee 08/16/2011   Hemorrhoids 08/10/2011   Other dyspnea and respiratory abnormality 06/21/2010   Mixed incontinence 05/27/2010   OVERACTIVE BLADDER 03/12/2010   Lumbar degenerative disc disease 04/13/2009   Chronic GERD 10/15/2008   History of cardiovascular disorder 10/15/2008   Arthropathy, multiple sites 06/26/2008   EXTERNAL HEMORRHOIDS 02/01/2008   HIATAL HERNIA 02/01/2008   DIVERTICULOSIS OF COLON 02/01/2008   GASTRITIS, HX OF 02/01/2008   DYSPNEA 09/19/2007   Vitamin D deficiency 07/03/2007   Osteoporosis of lumbar spine 06/13/2007   Fatigue 06/13/2007   HELICOBACTER PYLORI INFECTION 06/06/2006   Hyperlipidemia 06/06/2006   Generalized anxiety disorder 06/06/2006   Adjustment disorder with mixed anxiety and depressed mood 06/06/2006   MACULAR DEGENERATION 06/06/2006   H/O: CVA (cerebrovascular accident) 06/06/2006   OSTEOARTHRITIS 06/06/2006  Past Medical History:  Diagnosis Date   Alternating constipation and diarrhea    Anxiety state, unspecified    CHF (congestive heart failure) (HCC)    Complication of anesthesia    slow to wake up with surgery several years ago    COVID-19 virus infection 12/2018   Depressive disorder, not elsewhere classified    Diaphragmatic hernia without mention of obstruction or gangrene    Difficulty sleeping    Diverticulosis of colon (without mention of hemorrhage)    Esophageal reflux    External hemorrhoids without mention of complication     Family history of adverse reaction to anesthesia    Dad very slow to wake upi   Fatigue    Frequency of urination    Gallstones    Headache    Helicobacter pylori (H. pylori)    Hiatal hernia    History of nuclear stress test    Myoview 05/2021: EF 70, normal perfusion; low risk   History of transfusion    Lumbar spondylosis    Macular degeneration    Macular degeneration (senile) of retina, unspecified    Osteoarthrosis, unspecified whether generalized or localized, unspecified site 08/11/2011   hx. "rhematoid arthritis", osteoarthritis, DDD, bursitis(hip)   Osteoporosis    Palpitations    PONV (postoperative nausea and vomiting)    Schatzki's ring    Shortness of breath 08/11/2011   with exertion only at present   Unspecified cerebral artery occlusion with cerebral infarction 08/11/2011   '97/ '06( TIA)-affected lt. side, slight weakness L side   Unspecified vitamin D deficiency    Past Surgical History:  Procedure Laterality Date   ABDOMINAL HYSTERECTOMY  08-11-11   APPENDECTOMY     BACK SURGERY  08-11-11   '11-hx. lumbar fusion with retained hardware   CARPAL TUNNEL RELEASE Right 05/02/2022   Procedure: RIGHT CARPAL TUNNEL RELEASE;  Surgeon: Betha Loa, MD;  Location: [facility];  Service: Orthopedics;  Laterality: Right;  Bier block   CATARACT EXTRACTION  08-11-11   Bilateral   ESOPHAGEAL DILATION     ESOPHAGOGASTRODUODENOSCOPY (EGD) WITH PROPOFOL N/A 12/06/2021   Procedure: ESOPHAGOGASTRODUODENOSCOPY (EGD) WITH PROPOFOL;  Surgeon: Jenel Lucks, MD;  Location: [facility];  Service: Gastroenterology;  Laterality: N/A;   KNEE SURGERY  08-11-11   rt. knee scope   PATELLAR TENDON REPAIR Left 02/22/2014   Procedure: PATELLA TENDON REPAIR;  Surgeon: Loanne Drilling, MD;  Location: [facility];  Service: Orthopedics;  Laterality: Left;   PATELLAR TENDON REPAIR Left 06/25/2014   Procedure: LEFT PATELLA TENDON REPAIR;  Surgeon: Ollen Gross, MD;  Location: [facility];   Service: Orthopedics;  Laterality: Left;   SAVORY DILATION N/A 12/06/2021   Procedure: SAVORY DILATION;  Surgeon: Jenel Lucks, MD;  Location: [facility];  Service: Gastroenterology;  Laterality: N/A;   TOTAL KNEE ARTHROPLASTY  08/16/2011   Procedure: TOTAL KNEE ARTHROPLASTY;  Surgeon: Loanne Drilling, MD;  Location: [facility];  Service: Orthopedics;  Laterality: Right;   TOTAL KNEE ARTHROPLASTY Left 01/27/2014   Procedure: LEFT TOTAL KNEE ARTHROPLASTY;  Surgeon: Loanne Drilling, MD;  Location: [facility];  Service: Orthopedics;  Laterality: Left;   TRIGGER FINGER RELEASE Right 05/02/2022   Procedure: RELEASE TRIGGER FINGER/A-1 PULLEY RIGHT LONG FINGER;  Surgeon: Betha Loa, MD;  Location:  [facility];  Service: Orthopedics;  Laterality: Right;  Bier block   TUBAL LIGATION     Social History   Tobacco Use   Smoking status: Never  Smokeless tobacco: Never  Vaping Use   Vaping Use: Never used  Substance Use Topics   Alcohol use: No    Alcohol/week: 0.0 standard drinks of alcohol   Drug use: No   Family History  Problem Relation Age of Onset   Heart disease Father    Coronary artery disease Brother    Diabetes Brother    Heart disease Brother    Pancreatic cancer Daughter    Diabetes Sister    Colon cancer Neg Hx    Colon polyps Neg Hx    Esophageal cancer Neg Hx    Gallbladder disease Neg Hx    Allergies  Allergen Reactions   Alendronate Sodium Other (See Comments)    REACTION: JAW PAIN   Celecoxib Other (See Comments)    REACTION: GI UPSET    Chocolate Diarrhea   Omeprazole Nausea Only   Rabeprazole Nausea And Vomiting   Latex Itching and Rash   Sulfa Antibiotics Rash   Current Outpatient Medications on File Prior to Visit  Medication Sig Dispense Refill   APPLE CIDER VINEGAR PO Take 2 tablets by mouth at bedtime.     atorvastatin (LIPITOR) 10 MG tablet TAKE 1 TABLET DAILY 90 tablet 0   buPROPion (WELLBUTRIN XL) 150 MG 24 hr tablet Take 1 tablet  (150 mg total) by mouth daily. 90 tablet 0   calcium carbonate (TUMS - DOSED IN MG ELEMENTAL CALCIUM) 500 MG chewable tablet Chew 1 tablet (200 mg of elemental calcium total) by mouth 4 (four) times daily as needed for indigestion or heartburn. 10 tablet 0   clopidogrel (PLAVIX) 75 MG tablet Take 1 tablet (75 mg total) by mouth daily. 90 tablet 3   diclofenac sodium (VOLTAREN) 1 % GEL Apply 1 application  topically at bedtime.     estradiol (ESTRACE) 0.1 MG/GM vaginal cream USE A PEA SIZED AMOUNT OF CREAM VAGINALLY TWICE PER WEEK 42.5 g 0   famotidine (PEPCID) 40 MG tablet TAKE 1 TABLET DAILY 90 tablet 0   furosemide (LASIX) 20 MG tablet TAKE 1 TABLET DAILY (Patient taking differently: Alternate 20 and 40 mg every other day) 90 tablet 2   gabapentin (NEURONTIN) 100 MG capsule TAKE 2 CAPSULES THREE TIMES A DAY 540 capsule 0   HYDROcodone-acetaminophen (NORCO/VICODIN) 5-325 MG tablet 1-2 tabs PO q6 hours prn pain 15 tablet 0   Liniments (ABSORBINE ARTHRITIS STRENGTH EX) Apply 1 application  topically daily as needed (Back pain).     loperamide (IMODIUM) 2 MG capsule Take 2 mg by mouth as needed for diarrhea or loose stools.     metoprolol succinate (TOPROL-XL) 25 MG 24 hr tablet TAKE ONE-HALF (1/2) TABLET AT BEDTIME 45 tablet 3   Multiple Vitamins-Minerals (PRESERVISION AREDS PO) Take 1 capsule by mouth 2 (two) times daily.     pantoprazole (PROTONIX) 40 MG tablet Take 1 tablet (40 mg total) by mouth 2 (two) times daily. 60 tablet 5   Polyethyl Glycol-Propyl Glycol (SYSTANE OP) Apply 1-2 drops to eye 3 (three) times daily as needed (dry eyes.).      sertraline (ZOLOFT) 100 MG tablet TAKE 2 TABLETS DAILY 180 tablet 0   nitroGLYCERIN (NITROSTAT) 0.4 MG SL tablet Place 1 tablet (0.4 mg total) under the tongue every 5 (five) minutes as needed for chest pain. 25 tablet 3   No current facility-administered medications on file prior to visit.     Review of Systems  Constitutional:  Positive for fatigue.  Negative for activity change, appetite change, fever  and unexpected weight change.  HENT:  Negative for congestion, ear pain, rhinorrhea, sinus pressure and sore throat.   Eyes:  Negative for pain, redness and visual disturbance.  Respiratory:  Negative for cough, shortness of breath and wheezing.   Cardiovascular:  Negative for chest pain and palpitations.  Gastrointestinal:  Negative for abdominal pain, blood in stool, constipation and diarrhea.  Endocrine: Negative for polydipsia and polyuria.  Genitourinary:  Negative for dysuria, frequency and urgency.  Musculoskeletal:  Positive for back pain. Negative for arthralgias and myalgias.       Chronic pain  Worst in back   Skin:  Negative for pallor and rash.  Allergic/Immunologic: Negative for environmental allergies.  Neurological:  Negative for dizziness, syncope and headaches.       R hand is healing from carpal tunnel surgery  Hematological:  Negative for adenopathy. Does not bruise/bleed easily.  Psychiatric/Behavioral:  Positive for dysphoric mood. Negative for agitation, behavioral problems, confusion, decreased concentration, self-injury and suicidal ideas. The patient is not nervous/anxious.        Objective:   Physical Exam Constitutional:      General: She is not in acute distress.    Appearance: Normal appearance. She is well-developed. She is obese. She is not ill-appearing or diaphoretic.  HENT:     Head: Normocephalic and atraumatic.  Eyes:     Conjunctiva/sclera: Conjunctivae normal.     Pupils: Pupils are equal, round, and reactive to light.  Neck:     Thyroid: No thyromegaly.     Vascular: No carotid bruit or JVD.  Cardiovascular:     Rate and Rhythm: Normal rate and regular rhythm.     Heart sounds: Normal heart sounds.     No gallop.  Pulmonary:     Effort: Pulmonary effort is normal. No respiratory distress.     Breath sounds: Normal breath sounds. No wheezing or rales.  Abdominal:     General: There is  no distension or abdominal bruit.     Palpations: Abdomen is soft.  Musculoskeletal:     Cervical back: Normal range of motion and neck supple.     Right lower leg: No edema.     Left lower leg: No edema.     Comments: Arthritis changes in spine and hands   Healing incision in R hand   Lymphadenopathy:     Cervical: No cervical adenopathy.  Skin:    General: Skin is warm and dry.     Coloration: Skin is not pale.     Findings: No rash.  Neurological:     Mental Status: She is alert.     Coordination: Coordination normal.     Deep Tendon Reflexes: Reflexes are normal and symmetric. Reflexes normal.  Psychiatric:        Attention and Perception: Attention normal.        Mood and Affect: Mood normal. Mood is not anxious or depressed.        Behavior: Behavior normal.        Cognition and Memory: Cognition normal.     Comments: Mood is pretty positive today (a good day) Candidly discusses symptoms and stressors             Assessment & Plan:   Problem List Items Addressed This Visit       Other   Adjustment disorder with mixed anxiety and depressed mood - Primary    Pt was started on wellbutrin xl 150 mg daily 3 weeks ago Already takes  sertraline 200 mg daily (divided dose) She has not noted much of a difference but also no side effects Encouraged her to try to change wellbutrin dose time to am  Update [place] in 2 wk If no improvement can consider stopping it or increase dose  Today mood is fairly good Reviewed stressors/ coping techniques/symptoms/ support sources/ tx options and side effects in detail today Chronic pain is a big trigger/ frustrated about losing independence Of note she remains motivated Optometrist on the phone and social media  Gets outside  Reads and keeps busy In fact too busy to schedule counseling appt (but plans to do so ) Has annual exam in June -will touch base with her then as well

## 2022-05-31 DIAGNOSIS — M961 Postlaminectomy syndrome, not elsewhere classified: Secondary | ICD-10-CM | POA: Diagnosis not present

## 2022-05-31 DIAGNOSIS — M545 Low back pain, unspecified: Secondary | ICD-10-CM | POA: Diagnosis not present

## 2022-05-31 DIAGNOSIS — Z79891 Long term (current) use of opiate analgesic: Secondary | ICD-10-CM | POA: Diagnosis not present

## 2022-05-31 DIAGNOSIS — M4856XD Collapsed vertebra, not elsewhere classified, lumbar region, subsequent encounter for fracture with routine healing: Secondary | ICD-10-CM | POA: Diagnosis not present

## 2022-06-05 DIAGNOSIS — H353132 Nonexudative age-related macular degeneration, bilateral, intermediate dry stage: Secondary | ICD-10-CM | POA: Diagnosis not present

## 2022-06-09 ENCOUNTER — Ambulatory Visit: Payer: Medicare Other | Admitting: Pharmacist

## 2022-06-09 DIAGNOSIS — F4323 Adjustment disorder with mixed anxiety and depressed mood: Secondary | ICD-10-CM

## 2022-06-09 NOTE — Progress Notes (Signed)
Care Management & Coordination Services Pharmacy Note  06/09/2022 Name:  [name] [name] MRN:  [id number] DOB:  1934/11/16  Summary: F/U televist -Depression: pt has been on bupropion 150 mg for ~6 weeks now and reports no significant improvement; she has moved dose to AM as directed by PCP; she feels to busy to schedule counseling -Osteopenia: pt has started calcium-Vitamin gummies  Recommendations/Changes made from today's visit: -Increase bupropion XL 150 mg to 2 tablet (300 mg) daily; update PCP with effect at appt in 2 weeks  Follow up plan: -Pharmacist follow up televisit scheduled for 8 weeks -PCP appt 06/24/22 (CPE)    Subjective: [name] [name] is an 88 y.o. year old female who is a primary patient of Tower, Audrie Gallus, MD.  The care coordination team was consulted for assistance with disease management and care coordination needs.    Engaged with patient by telephone for initial visit. Patient lives with her son. She has 2 rental properties, she must make decisions about whether to rent or sell. She enjoy bible studies. She has an aid 3 times a week.  Recent office visits: 05/30/22 Dr Milinda Antis OV: adjustment disorder - give wellbutrin another 2 weeks, switch dose to AM.  12/28/21 Dr Milinda Antis OV: carpal tunnel, advised f/u with ortho hand specialist  05/12/21 Dr Elroy Channel annual visit  Recent consult visits: 02/16/22 Dr Merlyn Lot (Ortho surg): carpal tunnel, pursue surgical intervention  11/17/21 Dr Excell Seltzer (Cardiology): HF - SOB w/ activity, stable. No changes.   10/26/21 PA Lemmon (GI): esophageal dysphagia - Scheduled EGD. Change esomeprazole to pantoprazole 40 mg BID, increase famotidine to BID.  Hospital visits: 05/05/22 ED visit ([facility]): atypical chest pain - negative workup.  05/02/22 planned admission [facility]): carpal tunnel procedure  02/28/22 ED visit ([facility]): mechanical fall  12/06/21 planned admission ([facility]): EGD  10/14/21 ED visit [facility]): atypical chest pain - likely reflux  related.   Objective:  Lab Results  Component Value Date   CREATININE 0.75 05/05/2022   BUN 17 05/05/2022   GFR 68.74 05/12/2021   EGFR 60 02/16/2021   GFRNONAA >60 05/05/2022   GFRAA 79 07/03/2017   NA 140 05/05/2022   K 3.7 05/05/2022   CALCIUM 9.5 05/05/2022   CO2 26 05/05/2022   GLUCOSE 105 (H) 05/05/2022    Lab Results  Component Value Date/Time   HGBA1C 6.2 05/12/2021 12:27 PM   HGBA1C 6.1 03/19/2020 12:23 PM   GFR 68.74 05/12/2021 12:27 PM   GFR 72.59 03/19/2020 12:23 PM    Last diabetic Eye exam: No results found for: "HMDIABEYEEXA"  Last diabetic Foot exam: No results found for: "HMDIABFOOTEX"   Lab Results  Component Value Date   CHOL 186 02/16/2021   HDL 72 02/16/2021   LDLCALC 96 02/16/2021   TRIG 103 02/16/2021   CHOLHDL 2.6 02/16/2021       Latest Ref Rng & Units 05/05/2022   10:29 PM 09/24/2021    9:45 AM 05/12/2021   12:27 PM  Hepatic Function  Total Protein 6.5 - 8.1 g/dL 7.1  6.9  7.2   Albumin 3.5 - 5.0 g/dL 4.0  3.9  4.3   AST 15 - 41 U/L 15  21  20    ALT 0 - 44 U/L 8  15  13    Alk Phosphatase 38 - 126 U/L 85  81  83   Total Bilirubin 0.3 - 1.2 mg/dL 0.4  0.7  0.5   Bilirubin, Direct 0.0 - 0.2 mg/dL 0.1  Lab Results  Component Value Date/Time   TSH 2.97 05/12/2021 12:27 PM   TSH 2.17 03/19/2020 12:23 PM       Latest Ref Rng & Units 05/05/2022    9:52 PM 09/29/2021    1:01 PM 09/24/2021    9:45 AM  CBC  WBC 4.0 - 10.5 K/uL 5.8  5.6  6.3   Hemoglobin 12.0 - 15.0 g/dL 04.5  40.9  81.1   Hematocrit 36.0 - 46.0 % 38.9  39.2  38.8   Platelets 150 - 400 K/uL 261  255.0  243     Lab Results  Component Value Date/Time   VD25OH 45.37 05/12/2021 12:27 PM   VD25OH 42.55 03/19/2020 12:23 PM   VITAMINB12 666 05/12/2021 12:27 PM   VITAMINB12 1,304 (H) 06/27/2018 10:55 AM    Clinical ASCVD: Yes  The ASCVD Risk score (Arnett DK, et al., 2019) failed to calculate for the following reasons:   The 2019 ASCVD risk score is only valid for  ages 41 to 80   The patient has a prior MI or stroke diagnosis        05/17/2022    1:45 PM 05/17/2022    1:21 PM 05/12/2021   11:34 AM  Depression screen PHQ 2/9  Decreased Interest 3 3 0  Down, Depressed, Hopeless 1 3 0  PHQ - 2 Score 4 6 0  Altered sleeping 0 0   Tired, decreased energy 2 0   Change in appetite 2 0   Feeling bad or failure about yourself  0 0   Trouble concentrating 2 0   Moving slowly or fidgety/restless 0 0   Suicidal thoughts 0 0   PHQ-9 Score 10 6   Difficult doing work/chores Somewhat difficult Not difficult at all      Social History   Tobacco Use  Smoking Status Never  Smokeless Tobacco Never   BP Readings from Last 3 Encounters:  05/30/22 130/72  05/06/22 (!) 116/53  05/02/22 (!) 135/95   Pulse Readings from Last 3 Encounters:  05/30/22 64  05/06/22 64  05/02/22 61   Wt Readings from Last 3 Encounters:  05/30/22 194 lb 4 oz (88.1 kg)  05/17/22 196 lb (88.9 kg)  05/05/22 191 lb 12.8 oz (87 kg)   BMI Readings from Last 3 Encounters:  05/30/22 33.87 kg/m  05/17/22 34.18 kg/m  05/05/22 31.92 kg/m    Allergies  Allergen Reactions   Alendronate Sodium Other (See Comments)    REACTION: JAW PAIN   Celecoxib Other (See Comments)    REACTION: GI UPSET    Chocolate Diarrhea   Omeprazole Nausea Only   Rabeprazole Nausea And Vomiting   Latex Itching and Rash   Sulfa Antibiotics Rash    Medications Reviewed Today     Reviewed by Kathyrn Sheriff, [facility] (Pharmacist) on 06/09/22 at 1157  Med List Status: <None>   Medication Order Taking? Sig Documenting Provider Last Dose Status Informant  APPLE CIDER VINEGAR PO [id number] Yes Take 2 tablets by mouth at bedtime. [provider] Taking Active Self  atorvastatin (LIPITOR) 10 MG tablet [id number] Yes TAKE 1 TABLET DAILY Tower, Audrie Gallus, MD Taking Active   buPROPion (WELLBUTRIN XL) 150 MG 24 hr tablet [id number] Yes Take 1 tablet (150 mg total) by mouth daily. Tower, Audrie Gallus, MD  Taking Active   calcium carbonate (TUMS - DOSED IN MG ELEMENTAL CALCIUM) 500 MG chewable tablet [id number] Yes Chew 1 tablet (200 mg of elemental calcium total) by mouth  4 (four) times daily as needed for indigestion or heartburn. Perkins, Alexzandrew L, PA-C Taking Active Self  clopidogrel (PLAVIX) 75 MG tablet [id number] Yes Take 1 tablet (75 mg total) by mouth daily. Jenel Lucks, MD Taking Active   diclofenac sodium (VOLTAREN) 1 % GEL [id number] Yes Apply 1 application  topically at bedtime. [provider] Taking Active Self           Med Note Aurther Loft, Kenn File Feb 14, 2018  1:27 PM)    estradiol (ESTRACE) 0.1 MG/GM vaginal cream [id number] Yes USE A PEA SIZED AMOUNT OF CREAM VAGINALLY TWICE PER WEEK Tower, Audrie Gallus, MD Taking Active   famotidine (PEPCID) 40 MG tablet [id number] Yes TAKE 1 TABLET DAILY Tower, Audrie Gallus, MD Taking Active   furosemide (LASIX) 20 MG tablet [id number] Yes TAKE 1 TABLET DAILY  Patient taking differently: Alternate 20 and 40 mg every other day   Tonny Bollman, MD Taking Active Self  gabapentin (NEURONTIN) 100 MG capsule [id number] Yes TAKE 2 CAPSULES THREE TIMES A DAY Tower, Audrie Gallus, MD Taking Active   HYDROcodone-acetaminophen (NORCO/VICODIN) 5-325 MG tablet [id number] Yes 1-2 tabs PO q6 hours prn pain Betha Loa, MD Taking Active   Liniments (ABSORBINE ARTHRITIS STRENGTH EX) [id number] Yes Apply 1 application  topically daily as needed (Back pain). [provider] Taking Active Self  loperamide (IMODIUM) 2 MG capsule [id number] Yes Take 2 mg by mouth as needed for diarrhea or loose stools. [provider] Taking Active Self  metoprolol succinate (TOPROL-XL) 25 MG 24 hr tablet [id number] Yes TAKE ONE-HALF (1/2) TABLET AT BEDTIME Tonny Bollman, MD Taking Active   Multiple Vitamins-Minerals (PRESERVISION AREDS PO) [id number] Yes Take 1 capsule by mouth 2 (two) times daily. [provider] Taking Active Self  nitroGLYCERIN  (NITROSTAT) 0.4 MG SL tablet [id number]  Place 1 tablet (0.4 mg total) under the tongue every 5 (five) minutes as needed for chest pain. Tereso Newcomer T, PA-C  Expired 04/13/22 2359 Self  pantoprazole (PROTONIX) 40 MG tablet [id number] Yes Take 1 tablet (40 mg total) by mouth 2 (two) times daily. Unk Lightning, [place] Taking Active   Polyethyl Glycol-Propyl Glycol (SYSTANE OP) [id number] Yes Apply 1-2 drops to eye 3 (three) times daily as needed (dry eyes.).  [provider] Taking Active Self  sertraline (ZOLOFT) 100 MG tablet [id number] Yes TAKE 2 TABLETS DAILY Tower, Audrie Gallus, MD Taking Active             SDOH:  (Social Determinants of Health) assessments and interventions performed: Yes SDOH Interventions    Flowsheet Row Clinical Support from 05/17/2022 in [facility] [place] Telephone from 05/11/2022 in [facility] Coordination Care Coordination from 04/28/2022 in [facility] Clinical Support from 02/09/2018 in [facility] Clinical Support from 02/08/2017 in [facility] Clinical Support from 12/14/2015 in [facility] [place]  SDOH Interventions        Food Insecurity Interventions Intervention Not Indicated -- Intervention Not Indicated -- -- --  Housing Interventions Intervention Not Indicated -- Intervention Not Indicated -- -- --  Transportation Interventions Intervention Not Indicated Other (Comment)  Lanney Gins [employer] transportation application.] -- -- -- --  Utilities Interventions Intervention Not Indicated -- -- -- -- --  Alcohol Usage Interventions Intervention Not Indicated (Score <7) -- -- -- -- --  Depression Interventions/Treatment  Currently on Treatment  [patient to  call for appt with therapist] -- -- Currently on Treatment  [referral to PCP] --  [referral to PCP] --  [pt is being referred to PCP for for further  evaluation]  Financial Strain Interventions Intervention Not Indicated -- -- -- -- --  Physical Activity Interventions Patient Refused, Other (Comments), Intervention Not Indicated -- -- -- -- --  Stress Interventions Intervention Not Indicated, Other (Comment)  [patient will call for appt with therapist currently under treatment] -- -- -- -- --  Social Connections Interventions Intervention Not Indicated -- -- -- -- --       Medication Assistance: None required.  Patient affirms current coverage meets needs.  Medication Access: Within the past 30 days, how often has patient missed a dose of medication? 0 Is a pillbox or other method used to improve adherence? Yes  Factors that may affect medication adherence? no barriers identified Are meds synced by current pharmacy? No  Are meds delivered by current pharmacy? Yes  Does patient experience delays in picking up medications due to transportation concerns? No  Current Rx insurance plan: T[employer] Name and location of Current pharmacy:  [id number] - [place], [place] - [address] AT [facility] OF [address] [facility] [address] [place] [address] Phone: [phone] Fax: [phone]  [facility] - [place], [place] - [address] [place] [address] Phone: [phone] Fax: [phone]   Compliance/Adherence/Medication fill history: Care Gaps: None  Star-Rating Drugs: Atorvastatin - PDC 62%; LF 12/29/21 x 90 ds   Assessment/Plan   Heart Failure (Goal: BP < 140/90; prevent exacerbations) -Controlled - pt reports SOB, but declines swelling;she has lost 11 lbs (trying to lose weight - apple cider vinegar gummies, Keto-vite, cutting back on diet) -Current home BP/HR readings: none available -Current home daily weights: not checking; current weight 190 lbs -Last ejection fraction: 60-65% (Date: 02/2021) -HF type: HFpEF (EF > 50%) -NYHA Class: II (slight  limitation of activity) -Current treatment: Furosemide 20 mg - alternate 1 and 2 tablets QOD- Appropriate, Effective, Safe, Accessible Metoprolol succinate 25 mg - 1/2 tab HS - Appropriate, Effective, Safe, Accessible -Medications previously tried: n/a  -Educated on Benefits of medications for managing symptoms and prolonging life Importance of weighing daily; if you gain more than 3 pounds in one day or 5 pounds in one week, contact provider -Recommended to continue current medication  Hyperlipidemia: (LDL goal < 100) -Controlled - LDL 96 (01/2021) reasonable given age, lack of cardiac events -Hx aortic atherosclerosis on CT; no hx of cardiac event -Hx CVA 1998, 2021 -Current treatment: Atorvastatin 10 mg daily - Appropriate, Effective, Safe, Accessible Clopidogrel 75 mg daily - Appropriate, Effective, Safe, Accessible Nitroglycerin 0.4 mg SL PRN - has not needed -Medications previously tried: n/a  -Educated on Cholesterol goals;  -Recommended to continue current medication  Depression/Anxiety (Goal: manage symptoms) -Uncontrolled - pt started bupropion about 4 weeks ago, she has not noticed much improvement in mood but denies side effects; she has also been on prednisone the past couple weeks for back pain and will finish course tomorrow -PHQ9: 0 (04/2021) -GAD7: not on file -Connected with PCP for mental health support -Current treatment: Sertraline 100 mg BID - Appropriate, Query Effective Bupropion XL 150 mg daily -Appropriate, Query Effective -Medications previously tried/failed: xanax -Discussed bupropion action on dopamine and norepinephrine; discussed option to keep current dose, d/c bupropion or increase to 300 mg, pt opted to increase in hopes  that higher dose will help -Recommend to continue sertraline; increase bupropion to 300 mg (2 tablets) daily in AM  Osteopenia (Goal: prevent fractures) -Not ideally controlled - not taking ca/vit D -Last DEXA Scan: 09/2019; no hx of  fracture  T-Score femoral neck: -2.1  T-Score lumbar spine: -0.6  10-year probability of major osteoporotic fracture: 15.5%  10-year probability of hip fracture: 4.8% -Patient is a candidate for pharmacologic treatment due to T-Score -1.0 to -2.5 and 10-year risk of hip fracture > 3% -Current treatment  None -Medications previously tried: alendronate, Evista x 5 years (ended 2017) -Recommend [id number] units of vitamin D daily. Recommend 1200 mg of calcium daily from dietary and supplemental sources.  GERD (Goal: minimize symptoms of reflux ) -Controlled - pt reports improvement with PPI -S/p EGD; hx hiatal hernia -Current treatment  Pantoprazole 40 mg BID - Appropriate, Effective, Safe, Accessible Famotidine 40 mg daily - Appropriate, Effective, Safe, Accessible Tums PRN - not needed as much -Medications previously tried: none reported  -Recommended to continue current medication  Chronic pain (Goal: manage symptoms) -Controlled -Lumbar degenerative disc disease; hx trigeminal neuralgia -Current treatment  Gabapentin 100 mg - 2 cap TID - Appropriate, Effective, Safe, Accessible Voltaren gel - uses on knee, fingers - Appropriate, Effective, Safe, Accessible Tylenol 500 mg PRN - Appropriate, Effective, Safe, Accessible -Medications previously tried: n/a -Discussed Tylenol maximum daily dose 3000 mg/day  -Recommended to continue current medication  Health Maintenance -OTC: Preservision Areds, apple cider vinegar   Al Corpus, PharmD, BCACP, CPP Clinical Pharmacist Practitioner [facility] [phone]

## 2022-06-14 MED ORDER — BUPROPION HCL ER (XL) 150 MG PO TB24
300.0000 mg | ORAL_TABLET | Freq: Every day | ORAL | 0 refills | Status: DC
Start: 2022-06-14 — End: 2022-07-04

## 2022-06-15 ENCOUNTER — Encounter: Payer: Medicare Other | Admitting: Family Medicine

## 2022-06-15 DIAGNOSIS — T402X5A Adverse effect of other opioids, initial encounter: Secondary | ICD-10-CM | POA: Diagnosis not present

## 2022-06-15 DIAGNOSIS — Z79891 Long term (current) use of opiate analgesic: Secondary | ICD-10-CM | POA: Diagnosis not present

## 2022-06-15 DIAGNOSIS — M5136 Other intervertebral disc degeneration, lumbar region: Secondary | ICD-10-CM | POA: Diagnosis not present

## 2022-06-15 DIAGNOSIS — M4856XD Collapsed vertebra, not elsewhere classified, lumbar region, subsequent encounter for fracture with routine healing: Secondary | ICD-10-CM | POA: Diagnosis not present

## 2022-06-22 DIAGNOSIS — G5601 Carpal tunnel syndrome, right upper limb: Secondary | ICD-10-CM | POA: Diagnosis not present

## 2022-06-22 DIAGNOSIS — M65331 Trigger finger, right middle finger: Secondary | ICD-10-CM | POA: Diagnosis not present

## 2022-06-24 ENCOUNTER — Ambulatory Visit (INDEPENDENT_AMBULATORY_CARE_PROVIDER_SITE_OTHER): Payer: Medicare Other | Admitting: Family Medicine

## 2022-06-24 ENCOUNTER — Encounter: Payer: Self-pay | Admitting: Family Medicine

## 2022-06-24 VITALS — BP 114/62 | HR 68 | Temp 97.8°F | Wt 193.0 lb

## 2022-06-24 DIAGNOSIS — Z6833 Body mass index (BMI) 33.0-33.9, adult: Secondary | ICD-10-CM | POA: Diagnosis not present

## 2022-06-24 DIAGNOSIS — I700 Atherosclerosis of aorta: Secondary | ICD-10-CM

## 2022-06-24 DIAGNOSIS — F4323 Adjustment disorder with mixed anxiety and depressed mood: Secondary | ICD-10-CM

## 2022-06-24 DIAGNOSIS — E559 Vitamin D deficiency, unspecified: Secondary | ICD-10-CM

## 2022-06-24 DIAGNOSIS — I2510 Atherosclerotic heart disease of native coronary artery without angina pectoris: Secondary | ICD-10-CM

## 2022-06-24 DIAGNOSIS — E6609 Other obesity due to excess calories: Secondary | ICD-10-CM | POA: Diagnosis not present

## 2022-06-24 DIAGNOSIS — E782 Mixed hyperlipidemia: Secondary | ICD-10-CM

## 2022-06-24 DIAGNOSIS — H6121 Impacted cerumen, right ear: Secondary | ICD-10-CM

## 2022-06-24 DIAGNOSIS — M5136 Other intervertebral disc degeneration, lumbar region: Secondary | ICD-10-CM | POA: Diagnosis not present

## 2022-06-24 DIAGNOSIS — E2839 Other primary ovarian failure: Secondary | ICD-10-CM

## 2022-06-24 DIAGNOSIS — Z79899 Other long term (current) drug therapy: Secondary | ICD-10-CM | POA: Diagnosis not present

## 2022-06-24 DIAGNOSIS — K219 Gastro-esophageal reflux disease without esophagitis: Secondary | ICD-10-CM | POA: Diagnosis not present

## 2022-06-24 DIAGNOSIS — I5032 Chronic diastolic (congestive) heart failure: Secondary | ICD-10-CM

## 2022-06-24 DIAGNOSIS — H6120 Impacted cerumen, unspecified ear: Secondary | ICD-10-CM | POA: Insufficient documentation

## 2022-06-24 DIAGNOSIS — R7303 Prediabetes: Secondary | ICD-10-CM | POA: Diagnosis not present

## 2022-06-24 NOTE — Progress Notes (Unsigned)
Subjective:    Patient ID: [name] [name], female    DOB: 1934/06/02, 88 y.o.   MRN: [id number]  HPI  Here for annual f/u of chronic  health problems   Wt Readings from Last 3 Encounters:  06/24/22 193 lb (87.5 kg)  05/30/22 194 lb 4 oz (88.1 kg)  05/17/22 196 lb (88.9 kg)   33.65 kg/m  Vitals:   06/24/22 1421  BP: 114/62  Pulse: 68  Temp: 97.8 F (36.6 C)  SpO2: 95%    Immunization History  Administered Date(s) Administered   Influenza Split 10/17/2011   Influenza Whole 10/24/2003, 10/19/2006, 10/15/2007, 11/15/2008, 10/17/2009   Influenza, High Dose Seasonal PF 10/25/2016, 09/28/2017, 12/24/2021   Influenza, Seasonal, Injecte, Preservative Fre 09/26/2018   Influenza,inj,Quad PF,6+ Mos 09/19/2012, 11/26/2013   Influenza-Unspecified 11/10/2014, 09/18/2015   PFIZER(Purple Top)SARS-COV-2 Vaccination 12/17/2019, 01/07/2020   Pneumococcal Conjugate-13 05/07/2013   Pneumococcal Polysaccharide-23 01/03/2002, 06/13/2007   Td 10/09/1996, 06/13/2007   Zoster Recombinat (Shingrix) 09/26/2018, 12/19/2018   Zoster, Live 03/21/2013   Chronic prednisone and hydrocodone from ortho  Hanging in there   Right ear has been sore  Uses some peroxide   Health Maintenance Due  Topic Date Due   DTaP/Tdap/Td (3 - Tdap) 06/12/2017   COVID-19 Vaccine (3 - Pfizer risk series) 02/04/2020   Tetanus shot - will check with insurance   Mammogram- declines Self breast exam: no lumps on exam  No blood in stool   Out aged colon cancer screening    IBS -back and forth between constipation or diarrhea  Eats a good diet    Dexa  09/2019 osteopenia  (with hx/o osteoporosis in past) Intolerant of alendronate in the past  Falls - 2 this year (foot caught in electric cord) Uses a walker or cane  Fractures  h/o lumbar compression fractures L3 and L5 and previously L3 Supplements  Last vitamin D Lab Results  Component Value Date   VD25OH 45.37 05/12/2021    Exercise : does some  weight lifting with 3 lb weight    Mood    06/24/2022    2:28 PM 05/17/2022    1:45 PM 05/17/2022    1:21 PM 05/12/2021   11:34 AM 05/11/2020   10:56 AM  Depression screen PHQ 2/9  Decreased Interest 2 3 3  0 0  Down, Depressed, Hopeless 3 1 3  0 0  PHQ - 2 Score 5 4 6  0 0  Altered sleeping 1 0 0    Tired, decreased energy 3 2 0    Change in appetite 2 2 0    Feeling bad or failure about yourself  3 0 0    Trouble concentrating 0 2 0    Moving slowly or fidgety/restless 2 0 0    Suicidal thoughts 0 0 0    PHQ-9 Score 16 10 6     Difficult doing work/chores Somewhat difficult Somewhat difficult Not difficult at all      Depression and anxiety  Some good and bad days based on frustrations  Zoloft 100 mg daily bid Wellbutrin xl 300 mg daily   Loneliness Chronic pain   H/o aortic atherosclerosis and CAD on CT Also CHF Also past CVA Plavix Metoprolol  Lasix Cardiology care   Hyperlipidemia Lab Results  Component Value Date   CHOL 186 02/16/2021   CHOL 163 03/19/2020   CHOL 167 04/29/2019   Lab Results  Component Value Date   HDL 72 02/16/2021   HDL 65.20 03/19/2020   HDL  69.90 04/29/2019   Lab Results  Component Value Date   LDLCALC 96 02/16/2021   LDLCALC 73 03/19/2020   LDLCALC 78 04/29/2019   Lab Results  Component Value Date   TRIG 103 02/16/2021   TRIG 123.0 03/19/2020   TRIG 96.0 04/29/2019   Lab Results  Component Value Date   CHOLHDL 2.6 02/16/2021   CHOLHDL 3 03/19/2020   CHOLHDL 2 04/29/2019   No results found for: "LDLDIRECT" Atorvastatin 10 mg daily   Eating well  Grilled chicken     GERD Pepcid 40 mg daily  Protonix 40 mg bid from GI  Lab Results  Component Value Date   VITAMINB12 666 05/12/2021     CMP     Component Value Date/Time   NA 140 05/05/2022 2152   NA 141 02/16/2021 1550   K 3.7 05/05/2022 2152   CL 102 05/05/2022 2152   CO2 26 05/05/2022 2152   GLUCOSE 105 (H) 05/05/2022 2152   BUN 17 05/05/2022 2152   BUN  20 02/16/2021 1550   CREATININE 0.75 05/05/2022 2152   CALCIUM 9.5 05/05/2022 2152   PROT 7.1 05/05/2022 2229   PROT 7.3 02/16/2021 1550   ALBUMIN 4.0 05/05/2022 2229   ALBUMIN 4.5 02/16/2021 1550   AST 15 05/05/2022 2229   ALT 8 05/05/2022 2229   ALKPHOS 85 05/05/2022 2229   BILITOT 0.4 05/05/2022 2229   BILITOT 0.2 02/16/2021 1550   GFR 68.74 05/12/2021 1227   EGFR 60 02/16/2021 1550   GFRNONAA >60 05/05/2022 2152   Prediabetes Lab Results  Component Value Date   HGBA1C 6.2 05/12/2021   Patient Active Problem List   Diagnosis Date Noted   Cerumen impaction 06/24/2022   Right shoulder pain 12/28/2021   Esophageal dysphagia 12/06/2021   Current use of proton pump inhibitor 05/12/2021   Precordial chest pain 05/07/2021   Aortic atherosclerosis (HCC) 05/07/2021   Coronary artery calcification seen on CT scan 05/07/2021   Myalgia 12/09/2019   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 12/09/2019   Chronic tension-type headache, not intractable 09/06/2019   Ageusia 09/06/2019   Anosmia 09/06/2019   Trigeminal neuralgia 03/15/2019   Mobility impaired 12/11/2018   Poor balance 08/13/2018   Memory loss 06/27/2018   Physical deconditioning 03/19/2018   Chronic heart failure with preserved ejection fraction (HCC) 02/09/2018   Prediabetes 01/26/2017   Hearing loss 01/19/2016   Carpal tunnel syndrome 08/31/2015   Encounter for Medicare annual wellness exam 06/04/2014   Obesity 06/04/2014   Estrogen deficiency 06/04/2014   Schatzki's ring 03/27/2014   OA (osteoarthritis) of knee 08/16/2011   Hemorrhoids 08/10/2011   Other dyspnea and respiratory abnormality 06/21/2010   Mixed incontinence 05/27/2010   OVERACTIVE BLADDER 03/12/2010   Lumbar degenerative disc disease 04/13/2009   Chronic GERD 10/15/2008   History of cardiovascular disorder 10/15/2008   Arthropathy, multiple sites 06/26/2008   EXTERNAL HEMORRHOIDS 02/01/2008   HIATAL HERNIA 02/01/2008    DIVERTICULOSIS OF COLON 02/01/2008   GASTRITIS, HX OF 02/01/2008   DYSPNEA 09/19/2007   Vitamin D deficiency 07/03/2007   Osteoporosis of lumbar spine 06/13/2007   Fatigue 06/13/2007   History of Helicobacter pylori infection 06/06/2006   Hyperlipidemia 06/06/2006   Generalized anxiety disorder 06/06/2006   Adjustment disorder with mixed anxiety and depressed mood 06/06/2006   MACULAR DEGENERATION 06/06/2006   H/O: CVA (cerebrovascular accident) 06/06/2006   OSTEOARTHRITIS 06/06/2006   Past Medical History:  Diagnosis Date   Alternating constipation and diarrhea    Anxiety state,  unspecified    CHF (congestive heart failure) (HCC)    Complication of anesthesia    slow to wake up with surgery several years ago    COVID-19 virus infection 12/2018   Depressive disorder, not elsewhere classified    Diaphragmatic hernia without mention of obstruction or gangrene    Difficulty sleeping    Diverticulosis of colon (without mention of hemorrhage)    Esophageal reflux    External hemorrhoids without mention of complication    Family history of adverse reaction to anesthesia    Dad very slow to wake upi   Fatigue    Frequency of urination    Gallstones    Headache    Helicobacter pylori (H. pylori)    Hiatal hernia    History of nuclear stress test    Myoview 05/2021: EF 70, normal perfusion; low risk   History of transfusion    Lumbar spondylosis    Macular degeneration    Macular degeneration (senile) of retina, unspecified    Osteoarthrosis, unspecified whether generalized or localized, unspecified site 08/11/2011   hx. "rhematoid arthritis", osteoarthritis, DDD, bursitis(hip)   Osteoporosis    Palpitations    PONV (postoperative nausea and vomiting)    Schatzki's ring    Shortness of breath 08/11/2011   with exertion only at present   Unspecified cerebral artery occlusion with cerebral infarction 08/11/2011   '97/ '06( TIA)-affected lt. side, slight weakness L side    Unspecified vitamin D deficiency    Past Surgical History:  Procedure Laterality Date   ABDOMINAL HYSTERECTOMY  08-11-11   APPENDECTOMY     BACK SURGERY  08-11-11   '11-hx. lumbar fusion with retained hardware   CARPAL TUNNEL RELEASE Right 05/02/2022   Procedure: RIGHT CARPAL TUNNEL RELEASE;  Surgeon: Betha Loa, MD;  Location: [facility];  Service: Orthopedics;  Laterality: Right;  Bier block   CATARACT EXTRACTION  08-11-11   Bilateral   ESOPHAGEAL DILATION     ESOPHAGOGASTRODUODENOSCOPY (EGD) WITH PROPOFOL N/A 12/06/2021   Procedure: ESOPHAGOGASTRODUODENOSCOPY (EGD) WITH PROPOFOL;  Surgeon: Jenel Lucks, MD;  Location: [facility];  Service: Gastroenterology;  Laterality: N/A;   KNEE SURGERY  08-11-11   rt. knee scope   PATELLAR TENDON REPAIR Left 02/22/2014   Procedure: PATELLA TENDON REPAIR;  Surgeon: Loanne Drilling, MD;  Location: [facility];  Service: Orthopedics;  Laterality: Left;   PATELLAR TENDON REPAIR Left 06/25/2014   Procedure: LEFT PATELLA TENDON REPAIR;  Surgeon: Ollen Gross, MD;  Location: [facility];  Service: Orthopedics;  Laterality: Left;   SAVORY DILATION N/A 12/06/2021   Procedure: SAVORY DILATION;  Surgeon: Jenel Lucks, MD;  Location: [facility];  Service: Gastroenterology;  Laterality: N/A;   TOTAL KNEE ARTHROPLASTY  08/16/2011   Procedure: TOTAL KNEE ARTHROPLASTY;  Surgeon: Loanne Drilling, MD;  Location: [facility];  Service: Orthopedics;  Laterality: Right;   TOTAL KNEE ARTHROPLASTY Left 01/27/2014   Procedure: LEFT TOTAL KNEE ARTHROPLASTY;  Surgeon: Loanne Drilling, MD;  Location: [facility];  Service: Orthopedics;  Laterality: Left;   TRIGGER FINGER RELEASE Right 05/02/2022   Procedure: RELEASE TRIGGER FINGER/A-1 PULLEY RIGHT LONG FINGER;  Surgeon: Betha Loa, MD;  Location: [facility];  Service: Orthopedics;  Laterality: Right;  Bier block   TUBAL LIGATION     Social History   Tobacco Use   Smoking status: Never   Smokeless  tobacco: Never  Vaping Use   Vaping Use: Never used  Substance Use Topics  Alcohol use: No    Alcohol/week: 0.0 standard drinks of alcohol   Drug use: No   Family History  Problem Relation Age of Onset   Heart disease Father    Coronary artery disease Brother    Diabetes Brother    Heart disease Brother    Pancreatic cancer Daughter    Diabetes Sister    Colon cancer Neg Hx    Colon polyps Neg Hx    Esophageal cancer Neg Hx    Gallbladder disease Neg Hx    Allergies  Allergen Reactions   Alendronate Sodium Other (See Comments)    REACTION: JAW PAIN   Celecoxib Other (See Comments)    REACTION: GI UPSET    Chocolate Diarrhea   Omeprazole Nausea Only   Rabeprazole Nausea And Vomiting   Latex Itching and Rash   Sulfa Antibiotics Rash   Current Outpatient Medications on File Prior to Visit  Medication Sig Dispense Refill   APPLE CIDER VINEGAR PO Take 2 tablets by mouth at bedtime.     atorvastatin (LIPITOR) 10 MG tablet TAKE 1 TABLET DAILY 90 tablet 0   buPROPion (WELLBUTRIN XL) 150 MG 24 hr tablet Take 2 tablets (300 mg total) by mouth daily. 180 tablet 0   calcium carbonate (TUMS - DOSED IN MG ELEMENTAL CALCIUM) 500 MG chewable tablet Chew 1 tablet (200 mg of elemental calcium total) by mouth 4 (four) times daily as needed for indigestion or heartburn. 10 tablet 0   clopidogrel (PLAVIX) 75 MG tablet Take 1 tablet (75 mg total) by mouth daily. 90 tablet 3   diclofenac sodium (VOLTAREN) 1 % GEL Apply 1 application  topically at bedtime.     estradiol (ESTRACE) 0.1 MG/GM vaginal cream USE A PEA SIZED AMOUNT OF CREAM VAGINALLY TWICE PER WEEK 42.5 g 0   famotidine (PEPCID) 40 MG tablet TAKE 1 TABLET DAILY 90 tablet 0   furosemide (LASIX) 20 MG tablet TAKE 1 TABLET DAILY (Patient taking differently: Alternate 20 and 40 mg every other day) 90 tablet 2   gabapentin (NEURONTIN) 100 MG capsule TAKE 2 CAPSULES THREE TIMES A DAY 540 capsule 0   HYDROcodone-acetaminophen  (NORCO/VICODIN) 5-325 MG tablet 1-2 tabs PO q6 hours prn pain 15 tablet 0   Liniments (ABSORBINE ARTHRITIS STRENGTH EX) Apply 1 application  topically daily as needed (Back pain).     loperamide (IMODIUM) 2 MG capsule Take 2 mg by mouth as needed for diarrhea or loose stools.     metoprolol succinate (TOPROL-XL) 25 MG 24 hr tablet TAKE ONE-HALF (1/2) TABLET AT BEDTIME 45 tablet 3   Multiple Vitamins-Minerals (PRESERVISION AREDS PO) Take 1 capsule by mouth 2 (two) times daily.     pantoprazole (PROTONIX) 40 MG tablet Take 1 tablet (40 mg total) by mouth 2 (two) times daily. 60 tablet 5   Polyethyl Glycol-Propyl Glycol (SYSTANE OP) Apply 1-2 drops to eye 3 (three) times daily as needed (dry eyes.).      sertraline (ZOLOFT) 100 MG tablet TAKE 2 TABLETS DAILY 180 tablet 0   nitroGLYCERIN (NITROSTAT) 0.4 MG SL tablet Place 1 tablet (0.4 mg total) under the tongue every 5 (five) minutes as needed for chest pain. 25 tablet 3   No current facility-administered medications on file prior to visit.     Review of Systems  Constitutional:  Positive for fatigue. Negative for activity change, appetite change, fever and unexpected weight change.  HENT:  Negative for congestion, ear pain, rhinorrhea, sinus pressure and sore throat.  Eyes:  Negative for pain, redness and visual disturbance.  Respiratory:  Negative for cough, shortness of breath and wheezing.   Cardiovascular:  Negative for chest pain and palpitations.  Gastrointestinal:  Negative for abdominal pain, blood in stool, constipation and diarrhea.  Endocrine: Negative for polydipsia and polyuria.  Genitourinary:  Negative for dysuria, frequency and urgency.  Musculoskeletal:  Positive for arthralgias, back pain, gait problem, joint swelling and myalgias.  Skin:  Negative for pallor and rash.  Allergic/Immunologic: Negative for environmental allergies.  Neurological:  Negative for dizziness, syncope and headaches.  Hematological:  Negative for  adenopathy. Does not bruise/bleed easily.  Psychiatric/Behavioral:  Negative for decreased concentration and dysphoric mood. The patient is not nervous/anxious.        Objective:   Physical Exam Constitutional:      General: She is not in acute distress.    Appearance: Normal appearance. She is well-developed. She is obese. She is not ill-appearing or diaphoretic.  HENT:     Head: Normocephalic and atraumatic.     Right Ear: Tympanic membrane, ear canal and external ear normal.     Left Ear: Tympanic membrane, ear canal and external ear normal.     Nose: Nose normal. No congestion.     Mouth/Throat:     Mouth: Mucous membranes are moist.     Pharynx: Oropharynx is clear. No posterior oropharyngeal erythema.  Eyes:     General: No scleral icterus.    Extraocular Movements: Extraocular movements intact.     Conjunctiva/sclera: Conjunctivae normal.     Pupils: Pupils are equal, round, and reactive to light.  Neck:     Thyroid: No thyromegaly.     Vascular: No carotid bruit or JVD.  Cardiovascular:     Rate and Rhythm: Normal rate and regular rhythm.     Pulses: Normal pulses.     Heart sounds: Normal heart sounds.     No gallop.  Pulmonary:     Effort: Pulmonary effort is normal. No respiratory distress.     Breath sounds: Normal breath sounds. No wheezing.     Comments: Good air exch Chest:     Chest wall: No tenderness.  Abdominal:     General: Bowel sounds are normal. There is no distension or abdominal bruit.     Palpations: Abdomen is soft. There is no mass.     Tenderness: There is no abdominal tenderness.     Hernia: No hernia is present.  Genitourinary:    Comments: Declines breast cancer screening  Musculoskeletal:        General: No tenderness. Normal range of motion.     Cervical back: Normal range of motion and neck supple. No rigidity. No muscular tenderness.     Right lower leg: No edema.     Left lower leg: No edema.     Comments: No kyphosis    Lymphadenopathy:     Cervical: No cervical adenopathy.  Skin:    General: Skin is warm and dry.     Coloration: Skin is not pale.     Findings: No erythema or rash.     Comments: Solar lentigines diffusely   Neurological:     Mental Status: She is alert. Mental status is at baseline.     Cranial Nerves: No cranial nerve deficit.     Motor: No abnormal muscle tone.     Coordination: Coordination normal.     Gait: Gait normal.     Deep Tendon Reflexes: Reflexes are normal  and symmetric. Reflexes normal.  Psychiatric:        Mood and Affect: Mood normal.        Cognition and Memory: Cognition and memory normal.           Assessment & Plan:   Problem List Items Addressed This Visit       Cardiovascular and Mediastinum   Coronary artery calcification seen on CT scan    In setting of CHF and prior CVA Sees cardiology  Continues plavix, metoprolol and lasix No clinical changes       Relevant Orders   CBC with Differential/Platelet   Chronic heart failure with preserved ejection fraction (HCC)    Clinically stable Under care of cardiology Continue lasix       Aortic atherosclerosis (HCC) - Primary    Noted on CT scan No symptoms  Fair cholesterol control with atorvastatin 10 mg daily        Digestive   Chronic GERD    Continues pepcid 40 mg daily  Protonix 40 mg daily  Sees GI  Unable to come off medications Encouraged to watch diet  B12 is in therapeutic range      Relevant Orders   CBC with Differential/Platelet     Nervous and Auditory   Cerumen impaction     Musculoskeletal and Integument   Lumbar degenerative disc disease    And OA Chronic pain  Sees orthopedics- treated with steroid courses and hydrocodone  Adds to her depression        Other   Vitamin D deficiency    Last vitamin D Lab Results  Component Value Date   VD25OH 45.37 05/12/2021  Vitamin D level is therapeutic with current supplementation Disc importance of this to bone  and overall health       Relevant Orders   VITAMIN D 25 Hydroxy (Vit-D Deficiency, Fractures)   Prediabetes    A1c ordered  disc imp of low glycemic diet and wt loss to prevent DM2       Relevant Orders   Comprehensive metabolic panel   Hemoglobin A1c   Obesity    Discussed how this problem influences overall health and the risks it imposes  Reviewed plan for weight loss with lower calorie diet (via better food choices and also portion control or program like weight watchers) and exercise building up to or more than 30 minutes 5 days per week including some aerobic activity        Hyperlipidemia    Disc goals for lipids and reasons to control them Rev last labs with pt Rev low sat fat diet in detail Stable Continues atorvastatn 10 mg daily LDL up to 96 In setting of CAD and aortic atherosclerosis       Relevant Orders   TSH   Lipid panel   Comprehensive metabolic panel   Estrogen deficiency    Dexa ordered      Relevant Orders   DG Bone Density   Current use of proton pump inhibitor    B12 ordered       Relevant Orders   CBC with Differential/Platelet   Vitamin B12   Adjustment disorder with mixed anxiety and depressed mood    Per pt- improved witn increase wellbutrin xl to 300 mg daily Continues zoloft 100 mg bid  Reviewed stressors/ coping techniques/symptoms/ support sources/ tx options and side effects in detail today Affected by loneliness and chronic pain  Encouraged exercise and socialization as tolerated  Relevant Orders   Ambulatory referral to Psychology

## 2022-06-24 NOTE — Patient Instructions (Addendum)
If you want to update your tetanus shot (Td) then talk to your pharmacisty   Daily fiber supplement like metamucil may help your intermittent constipation or diarrhea  Make sure you eat fruits and vegetable   Add some strength training to your routine, this is important for bone and brain health and can reduce your risk of falls and help your body use insulin properly and regulate weight  Light weights, exercise bands , and internet videos are a good way to start  Yoga (chair or regular), machines , floor exercises or a gym with machines are also good options   Keep working with your weights  Go up on the weight if you can !   I want you to talk to a mental health counselor  I put the referral in  Please let us know if you don't hear in 1-2 weeks    Go to [facility] when convenient (after fasting at least 4 hours) - water and black coffee is fine    You have an order for:  []   2D Mammogram  []   3D Mammogram  [x]   Bone Density     Please call for appointment:   []   [facility]  [address] [place] [address]  [phone]  []   [facility])   [address]  [place], [place] [address]  [phone]  []   [facility]      [address] [place], [place]        [phone]         []   [facility]  [address] [place], [place]  [phone]  [x]  [facility]   [address]   [place], [place] [address]  [phone]  []  [facility]  [address] [place], [place] [address] [phone]    Make sure to wear two piece clothing  No lotions powders or deodorants the day of the appointment Make sure to bring picture ID and insurance card.  Bring list of medications you are currently taking including any supplements.    Schedule your screening mammogram through MyChart!   Select [facility] imaging sites can now be scheduled through MyChart.  Log into your MyChart account.  Go to 'Visit' (or 'Appointments' if  on mobile App) --> Schedule an  Appointment  Under 'Select a Reason for Visit' choose the Mammogram  Screening option.  Complete the pre-visit questions  and select the time and place that  best fits your schedule

## 2022-06-26 NOTE — Assessment & Plan Note (Signed)
Disc goals for lipids and reasons to control them Rev last labs with pt Rev low sat fat diet in detail Stable Continues atorvastatn 10 mg daily LDL up to 96 In setting of CAD and aortic atherosclerosis

## 2022-06-26 NOTE — Assessment & Plan Note (Signed)
And OA Chronic pain  Sees orthopedics- treated with steroid courses and hydrocodone  Adds to her depression

## 2022-06-26 NOTE — Assessment & Plan Note (Signed)
Discussed how this problem influences overall health and the risks it imposes  Reviewed plan for weight loss with lower calorie diet (via better food choices and also portion control or program like weight watchers) and exercise building up to or more than 30 minutes 5 days per week including some aerobic activity    

## 2022-06-26 NOTE — Assessment & Plan Note (Signed)
A1c ordered disc imp of low glycemic diet and wt loss to prevent DM2  

## 2022-06-26 NOTE — Assessment & Plan Note (Signed)
Clinically stable Under care of cardiology Continue lasix

## 2022-06-26 NOTE — Assessment & Plan Note (Signed)
Noted on CT scan No symptoms  Fair cholesterol control with atorvastatin 10 mg daily

## 2022-06-26 NOTE — Assessment & Plan Note (Signed)
Continues pepcid 40 mg daily  Protonix 40 mg daily  Sees GI  Unable to come off medications Encouraged to watch diet  B12 is in therapeutic range

## 2022-06-26 NOTE — Assessment & Plan Note (Signed)
Last vitamin D Lab Results  Component Value Date   VD25OH 45.37 05/12/2021   Vitamin D level is therapeutic with current supplementation Disc importance of this to bone and overall health

## 2022-06-26 NOTE — Assessment & Plan Note (Signed)
In setting of CHF and prior CVA Sees cardiology  Continues plavix, metoprolol and lasix No clinical changes

## 2022-06-26 NOTE — Assessment & Plan Note (Signed)
Per pt- improved witn increase wellbutrin xl to 300 mg daily Continues zoloft 100 mg bid  Reviewed stressors/ coping techniques/symptoms/ support sources/ tx options and side effects in detail today Affected by loneliness and chronic pain  Encouraged exercise and socialization as tolerated

## 2022-06-26 NOTE — Assessment & Plan Note (Signed)
Dexa ordered

## 2022-06-26 NOTE — Assessment & Plan Note (Signed)
B12 ordered.

## 2022-06-29 DIAGNOSIS — E782 Mixed hyperlipidemia: Secondary | ICD-10-CM | POA: Diagnosis not present

## 2022-06-29 DIAGNOSIS — Z79899 Other long term (current) drug therapy: Secondary | ICD-10-CM | POA: Diagnosis not present

## 2022-06-29 DIAGNOSIS — K219 Gastro-esophageal reflux disease without esophagitis: Secondary | ICD-10-CM | POA: Diagnosis not present

## 2022-06-29 DIAGNOSIS — I2510 Atherosclerotic heart disease of native coronary artery without angina pectoris: Secondary | ICD-10-CM | POA: Diagnosis not present

## 2022-06-29 DIAGNOSIS — R7303 Prediabetes: Secondary | ICD-10-CM | POA: Diagnosis not present

## 2022-06-29 DIAGNOSIS — E559 Vitamin D deficiency, unspecified: Secondary | ICD-10-CM | POA: Diagnosis not present

## 2022-06-30 LAB — COMPREHENSIVE METABOLIC PANEL
ALT: 11 IU/L (ref 0–32)
AST: 16 IU/L (ref 0–40)
Albumin/Globulin Ratio: 1.9
Albumin: 4.5 g/dL (ref 3.7–4.7)
Alkaline Phosphatase: 101 IU/L (ref 44–121)
BUN/Creatinine Ratio: 18 (ref 12–28)
BUN: 18 mg/dL (ref 8–27)
Bilirubin Total: 0.5 mg/dL (ref 0.0–1.2)
CO2: 26 mmol/L (ref 20–29)
Calcium: 9.3 mg/dL (ref 8.7–10.3)
Chloride: 101 mmol/L (ref 96–106)
Creatinine, Ser: 0.98 mg/dL (ref 0.57–1.00)
Globulin, Total: 2.4 g/dL (ref 1.5–4.5)
Glucose: 111 mg/dL — ABNORMAL HIGH (ref 70–99)
Potassium: 4.1 mmol/L (ref 3.5–5.2)
Sodium: 143 mmol/L (ref 134–144)
Total Protein: 6.9 g/dL (ref 6.0–8.5)
eGFR: 57 mL/min/{1.73_m2} — ABNORMAL LOW (ref >59)

## 2022-06-30 LAB — CBC WITH DIFFERENTIAL/PLATELET
Basophils Absolute: 0.1 10*3/uL (ref 0.0–0.2)
Basos: 1 %
EOS (ABSOLUTE): 0.2 10*3/uL (ref 0.0–0.4)
Eos: 3 %
Hematocrit: 39.8 % (ref 34.0–46.6)
Hemoglobin: 12.7 g/dL (ref 11.1–15.9)
Immature Grans (Abs): 0 10*3/uL (ref 0.0–0.1)
Immature Granulocytes: 0 %
Lymphocytes Absolute: 1.6 10*3/uL (ref 0.7–3.1)
Lymphs: 31 %
MCH: 28.2 pg (ref 26.6–33.0)
MCHC: 31.9 g/dL (ref 31.5–35.7)
MCV: 88 fL (ref 79–97)
Monocytes Absolute: 0.6 10*3/uL (ref 0.1–0.9)
Monocytes: 11 %
Neutrophils Absolute: 2.7 10*3/uL (ref 1.4–7.0)
Neutrophils: 54 %
Platelets: 245 10*3/uL (ref 150–450)
RBC: 4.5 x10E6/uL (ref 3.77–5.28)
RDW: 12.7 % (ref 11.7–15.4)
WBC: 5 10*3/uL (ref 3.4–10.8)

## 2022-06-30 LAB — LIPID PANEL
Chol/HDL Ratio: 2.4 ratio (ref 0.0–4.4)
Cholesterol, Total: 189 mg/dL (ref 100–199)
HDL: 79 mg/dL (ref >39)
LDL Chol Calc (NIH): 90 mg/dL (ref 0–99)
Triglycerides: 115 mg/dL (ref 0–149)
VLDL Cholesterol Cal: 20 mg/dL (ref 5–40)

## 2022-06-30 LAB — TSH: TSH: 3.18 u[IU]/mL (ref 0.450–4.500)

## 2022-06-30 LAB — HEMOGLOBIN A1C
Est. average glucose Bld gHb Est-mCnc: 128 mg/dL
Hgb A1c MFr Bld: 6.1 % — ABNORMAL HIGH (ref 4.8–5.6)

## 2022-06-30 LAB — VITAMIN B12: Vitamin B-12: 530 pg/mL (ref 232–1245)

## 2022-06-30 LAB — VITAMIN D 25 HYDROXY (VIT D DEFICIENCY, FRACTURES): Vit D, 25-Hydroxy: 37.8 ng/mL (ref 30.0–100.0)

## 2022-07-03 ENCOUNTER — Other Ambulatory Visit: Payer: Self-pay | Admitting: Family Medicine

## 2022-07-03 DIAGNOSIS — F4323 Adjustment disorder with mixed anxiety and depressed mood: Secondary | ICD-10-CM

## 2022-07-04 ENCOUNTER — Telehealth: Payer: Self-pay | Admitting: Family Medicine

## 2022-07-04 MED ORDER — BUPROPION HCL ER (XL) 150 MG PO TB24: 300.0000 mg | ORAL_TABLET | Freq: Every day | ORAL | 0 refills | Status: DC

## 2022-07-04 NOTE — Telephone Encounter (Signed)
Patient is requesting a phone call to discuss if she is diabetic or not.She said that based on her levels from her lab,the levels show that shes diabetic,however no one told her that she was diabetic and she hasn't been started on any medications.

## 2022-07-04 NOTE — Telephone Encounter (Signed)
Patient called in stating that [facility] said that they have not received the rx for buPROPion (WELLBUTRIN XL) 150 MG 24 hr tablet ,and would like to know if a verbal order could be called in?   Pharmacy:[phone]   Patient would also like to know if she can have a week worth sent in to the pharmacy,because it will take [facility] a week to send to her.   [id number] [place], [place] - [address] AT [facility] [address] [facility] [address] Phone: [phone]  Fax: [phone]

## 2022-07-04 NOTE — Telephone Encounter (Signed)
Not diabetic Her A1c is in the prediabetic range  Lab Results  Component Value Date   HGBA1C 6.1 (H) 06/29/2022    Please work on low glycemic diet and exercise as tolerated to prevent diabetes   Try to get most of your carbohydrates from produce (with the exception of white potatoes)  Eat less bread/pasta/rice/snack foods/cereals/sweets and other items from the middle of the grocery store (processed carbs)

## 2022-07-04 NOTE — Telephone Encounter (Signed)
Rx was just sent in today.   1 Week Rx sent into [facility]

## 2022-07-05 ENCOUNTER — Ambulatory Visit (INDEPENDENT_AMBULATORY_CARE_PROVIDER_SITE_OTHER): Payer: Medicare Other | Admitting: Behavioral Health

## 2022-07-05 ENCOUNTER — Encounter: Payer: Self-pay | Admitting: Behavioral Health

## 2022-07-05 DIAGNOSIS — F4323 Adjustment disorder with mixed anxiety and depressed mood: Secondary | ICD-10-CM

## 2022-07-05 DIAGNOSIS — F331 Major depressive disorder, recurrent, moderate: Secondary | ICD-10-CM

## 2022-07-05 DIAGNOSIS — H353132 Nonexudative age-related macular degeneration, bilateral, intermediate dry stage: Secondary | ICD-10-CM | POA: Diagnosis not present

## 2022-07-05 DIAGNOSIS — F411 Generalized anxiety disorder: Secondary | ICD-10-CM

## 2022-07-05 NOTE — Telephone Encounter (Signed)
Pt notified of Dr. Tower's comments and recommendations and verbalized understanding  

## 2022-07-05 NOTE — Progress Notes (Signed)
                Bristyl Mclees M Jancie Kercher, LCMHC 

## 2022-07-05 NOTE — Progress Notes (Addendum)
[facility] Counselor Initial Adult Exam  Name: [name] [name] Date: 07/05/2022 MRN: [id number] DOB: 12-Jul-1934 PCP: Judy Pimple, MD  Time spent: 58 minutes, 10 AM to 10:58 AM.This session was held via video teletherapy. The patient consented to the video teletherapy and was located in her home during this session. She is aware it is the responsibility of the patient to secure confidentiality on her end of the session. The provider was in a private home office for the duration of this session.    Guardian/Payee: Self  Paperwork requested: No   Reason for Visit /Presenting Problem: Anxiety is stress and depression  The patient is an 88 year old widowed female who currently lives with her son.  She was married to her husband for 65 years and said for the first 67 things are pretty good.  He died 3 years ago.  After 55 years of marriage she found out that he was being unfaithful to her so she moved out and bought a house where she lived for 10 years until her son built an additional day's house for her to have her own space.  She did not divorce her husband and when he got cancer he moved in with her and she took care of him.  She did not want to move in with her son now because of the relationship because she has a very good relationship with him but because admits losing some of her independence.  For the most part she does not drive only driving to church and the grocery store which are about a mile away.  She has a caregiver 3 days a week for 4 or 5 hours/day which she appreciates having.  There have been multiple episodes of grief for the patient.  In 1998 her daughter who is almost 45 years old died of cancer very quickly after being diagnosed.  She had 86 year old twins at the time as well as a 33 70 and 37-year-old.  Her son-in-law remarried in about 1 year and her grandchildren stepmother, she and her husband as well as the rest of her family off from her grandchildren.  She  would find out where they are going to be and sit in the car and watched him just to see him because the stepmother of her grandchildren would not let her see them and she did not reconnect with them until they were 86 years old.  They all came back to her after they turned 18 he can move away from the house.  The grade children's father and stepmother divorced and for a lengthy amount of time they cut their father off but the patient encouraged reconnecting with her father.  She has rebuilding relationships with all of her grandchildren now which she appreciates but says they are very busy and that can be difficult.  She has another daughter who lives in [place] who she is close to.  She has a great relationship with her son whom she lives with.  Her faith is very important to her and she is as active as she can be in her church.  Although she has her own space at her son's house he is very busy and works a lot.  He is looking for someone about his practice where he makes dentures and other orthodontic type appliances.  The form that she and her husband lived on initially is still in her possession that she is leaving out to her son.  [place] he [place] to her grandchildren.  She rents the home that she was living in before moving in with her son.  She says there are still a lot of things at the farm and in the house that she needs to do something with.  She says she has anxiety and always feels that she has to be doing something.  Intellectually she recognizes that she cannot do physically what she used to do but emotionally she says she has a hard time letting go of doing things.  She has always been a caregiver and has had a hard time accepting the fact that she can do as much for others that she would like to.  She feels guilty even if she sits down for short amount of time.  She describes her anxiety as not being able to calm down but does sleep well.  She does report some  episodes of depression where she has crying spells about losing independence, about past losses.  About health issues.  She was already taking Zoloft and added Wellbutrin to her medication about a month ago but says she has not noticed a significant difference yet.  She also reports it is hard to slow her brain down for time to tortoise her body down.  As her faith is important to her we talked about how she will approach this is spiritually and we also introduced some coping exercises such as relaxation breathing and faith based mindfulness to help her start to look at giving herself Kahdijah and recognizing that she still is doing a lot herself.  She does contract for safety having no thoughts of hurting herself or anyone else.   Mental Status Exam: Appearance:   Well Groomed     Behavior:  Appropriate  Motor:  Normal  Speech/Language:   Normal Rate  Affect:  Appropriate  Mood:  anxious  Thought process:  normal  Thought content:    WNL  Sensory/Perceptual disturbances:    WNL  Orientation:  oriented to person, place, time/date, situation, day of week, and month of year  Attention:  Good  Concentration:  Good  Memory:  WNL patient says at times her short-term memory is not as good as she would like it to be.  Fund of knowledge:   Good  Insight:    Good and Fair  Judgment:   Good  Impulse Control:  Good   Reported Symptoms: Anxiety/stress, depression  Risk Assessment: Danger to Self:  No Self-injurious Behavior: No Danger to Others: No Duty to Warn:no Physical Aggression / Violence:No  Access to Firearms a concern: No  Gang Involvement:No  Patient / guardian was educated about steps to take if suicide or homicide risk level increases between visits: n/a While future psychiatric events cannot be accurately predicted, the patient does not currently require acute inpatient psychiatric care and does not currently meet [facility] involuntary commitment criteria.  Substance Abuse  History: Current substance abuse:  None reported     Past Psychiatric History:   No previous psychological problems have been observed Outpatient Providers: Primary care physician History of Psych Hospitalization:  None reported Psychological Testing:  n/a    Abuse History:  Victim of: No., None Reported    Report needed: No. Victim of Neglect:No. Perpetrator of none reported  Witness / Exposure to Domestic Violence: None reported  Protective Services Involvement: No  Witness to [employer] Violence:  No   Family History:  Family History  Problem Relation Age of Onset  Heart disease Father    Coronary artery disease Brother    Diabetes Brother    Heart disease Brother    Pancreatic cancer Daughter    Diabetes Sister    Colon cancer Neg Hx    Colon polyps Neg Hx    Esophageal cancer Neg Hx    Gallbladder disease Neg Hx     Living situation: the patient lives with their son  Sexual Orientation: Straight  Relationship Status: widowed  Name of spouse / other: If a parent, number of children / ages: The patient has 3 adult children.  She lives with her adult son.  She has an adult daughter who lives close by.  She had a daughter who died in 49 at almost 72 years old.  Support Systems: friends Children and grandchildren  Financial Stress:  No   Income/Employment/Disability: Neurosurgeon: No   Educational History: Education: Risk manager: Protestant  Any cultural differences that may affect / interfere with treatment:  not applicable   Recreation/Hobbies: The patient loves to do things for other people  Stressors: Loss of daughter, husband, Independence    Strengths: Supportive Relationships, Church, Spirituality, Journalist, newspaper, and Able to Communicate Effectively  Barriers:     Legal History: Pending legal issue / charges: The patient has no significant history of legal issues. History  of legal issue / charges:  n/a  Medical History/Surgical History: reviewed Past Medical History:  Diagnosis Date   Alternating constipation and diarrhea    Anxiety state, unspecified    CHF (congestive heart failure) (HCC)    Complication of anesthesia    slow to wake up with surgery several years ago    COVID-19 virus infection 12/2018   Depressive disorder, not elsewhere classified    Diaphragmatic hernia without mention of obstruction or gangrene    Difficulty sleeping    Diverticulosis of colon (without mention of hemorrhage)    Esophageal reflux    External hemorrhoids without mention of complication    Family history of adverse reaction to anesthesia    Dad very slow to wake upi   Fatigue    Frequency of urination    Gallstones    Headache    Helicobacter pylori (H. pylori)    Hiatal hernia    History of nuclear stress test    Myoview 05/2021: EF 70, normal perfusion; low risk   History of transfusion    Lumbar spondylosis    Macular degeneration    Macular degeneration (senile) of retina, unspecified    Osteoarthrosis, unspecified whether generalized or localized, unspecified site 08/11/2011   hx. "rhematoid arthritis", osteoarthritis, DDD, bursitis(hip)   Osteoporosis    Palpitations    PONV (postoperative nausea and vomiting)    Schatzki's ring    Shortness of breath 08/11/2011   with exertion only at present   Unspecified cerebral artery occlusion with cerebral infarction 08/11/2011   '97/ '06( TIA)-affected lt. side, slight weakness L side   Unspecified vitamin D deficiency     Past Surgical History:  Procedure Laterality Date   ABDOMINAL HYSTERECTOMY  08-11-11   APPENDECTOMY     BACK SURGERY  08-11-11   '11-hx. lumbar fusion with retained hardware   CARPAL TUNNEL RELEASE Right 05/02/2022   Procedure: RIGHT CARPAL TUNNEL RELEASE;  Surgeon: Betha Loa, MD;  Location: [facility];  Service: Orthopedics;  Laterality: Right;  Bier block    CATARACT EXTRACTION  08-11-11   Bilateral  ESOPHAGEAL DILATION     ESOPHAGOGASTRODUODENOSCOPY (EGD) WITH PROPOFOL N/A 12/06/2021   Procedure: ESOPHAGOGASTRODUODENOSCOPY (EGD) WITH PROPOFOL;  Surgeon: Jenel Lucks, MD;  Location: [facility];  Service: Gastroenterology;  Laterality: N/A;   KNEE SURGERY  08-11-11   rt. knee scope   PATELLAR TENDON REPAIR Left 02/22/2014   Procedure: PATELLA TENDON REPAIR;  Surgeon: Loanne Drilling, MD;  Location: [facility];  Service: Orthopedics;  Laterality: Left;   PATELLAR TENDON REPAIR Left 06/25/2014   Procedure: LEFT PATELLA TENDON REPAIR;  Surgeon: Ollen Gross, MD;  Location: [facility];  Service: Orthopedics;  Laterality: Left;   SAVORY DILATION N/A 12/06/2021   Procedure: SAVORY DILATION;  Surgeon: Jenel Lucks, MD;  Location: [facility];  Service: Gastroenterology;  Laterality: N/A;   TOTAL KNEE ARTHROPLASTY  08/16/2011   Procedure: TOTAL KNEE ARTHROPLASTY;  Surgeon: Loanne Drilling, MD;  Location: [facility];  Service: Orthopedics;  Laterality: Right;   TOTAL KNEE ARTHROPLASTY Left 01/27/2014   Procedure: LEFT TOTAL KNEE ARTHROPLASTY;  Surgeon: Loanne Drilling, MD;  Location: [facility];  Service: Orthopedics;  Laterality: Left;   TRIGGER FINGER RELEASE Right 05/02/2022   Procedure: RELEASE TRIGGER FINGER/A-1 PULLEY RIGHT LONG FINGER;  Surgeon: Betha Loa, MD;  Location: [facility];  Service: Orthopedics;  Laterality: Right;  Bier block   TUBAL LIGATION      Medications: Current Outpatient Medications  Medication Sig Dispense Refill   APPLE CIDER VINEGAR PO Take 2 tablets by mouth at bedtime.     atorvastatin (LIPITOR) 10 MG tablet TAKE 1 TABLET DAILY 90 tablet 0   buPROPion (WELLBUTRIN XL) 150 MG 24 hr tablet Take 2 tablets (300 mg total) by mouth daily. 180 tablet 1   buPROPion (WELLBUTRIN XL) 150 MG 24 hr tablet Take 2 tablets (300 mg total) by mouth daily. 14 tablet 0   calcium carbonate (TUMS - DOSED IN MG ELEMENTAL CALCIUM)  500 MG chewable tablet Chew 1 tablet (200 mg of elemental calcium total) by mouth 4 (four) times daily as needed for indigestion or heartburn. 10 tablet 0   clopidogrel (PLAVIX) 75 MG tablet Take 1 tablet (75 mg total) by mouth daily. 90 tablet 3   diclofenac sodium (VOLTAREN) 1 % GEL Apply 1 application  topically at bedtime.     estradiol (ESTRACE) 0.1 MG/GM vaginal cream USE A PEA SIZED AMOUNT OF CREAM VAGINALLY TWICE PER WEEK 42.5 g 0   famotidine (PEPCID) 40 MG tablet TAKE 1 TABLET DAILY 90 tablet 0   furosemide (LASIX) 20 MG tablet TAKE 1 TABLET DAILY (Patient taking differently: Alternate 20 and 40 mg every other day) 90 tablet 2   gabapentin (NEURONTIN) 100 MG capsule TAKE 2 CAPSULES THREE TIMES A DAY 540 capsule 0   HYDROcodone-acetaminophen (NORCO/VICODIN) 5-325 MG tablet 1-2 tabs PO q6 hours prn pain 15 tablet 0   Liniments (ABSORBINE ARTHRITIS STRENGTH EX) Apply 1 application  topically daily as needed (Back pain).     loperamide (IMODIUM) 2 MG capsule Take 2 mg by mouth as needed for diarrhea or loose stools.     metoprolol succinate (TOPROL-XL) 25 MG 24 hr tablet TAKE ONE-HALF (1/2) TABLET AT BEDTIME 45 tablet 3   Multiple Vitamins-Minerals (PRESERVISION AREDS PO) Take 1 capsule by mouth 2 (two) times daily.     nitroGLYCERIN (NITROSTAT) 0.4 MG SL tablet Place 1 tablet (0.4 mg total) under the tongue every 5 (five) minutes as needed for chest pain. 25 tablet 3   pantoprazole (PROTONIX) 40  MG tablet Take 1 tablet (40 mg total) by mouth 2 (two) times daily. 60 tablet 5   Polyethyl Glycol-Propyl Glycol (SYSTANE OP) Apply 1-2 drops to eye 3 (three) times daily as needed (dry eyes.).      sertraline (ZOLOFT) 100 MG tablet TAKE 2 TABLETS DAILY 180 tablet 0   No current facility-administered medications for this visit.    Allergies  Allergen Reactions   Alendronate Sodium Other (See Comments)    REACTION: JAW PAIN   Celecoxib Other (See Comments)    REACTION: GI UPSET    Chocolate  Diarrhea   Omeprazole Nausea Only   Rabeprazole Nausea And Vomiting   Latex Itching and Rash   Sulfa Antibiotics Rash    Diagnoses:  Generalized anxiety disorder, major depressive disorder, recurrent, mild to moderate  Plan of Care: I will meet with the patient every 3 to 4 weeks via video session.  Goals will be to reduce anxiety/stress as well as mild to moderate depression.  Treatment plan will be developed in the next session.   French Ana, [facility]

## 2022-07-11 ENCOUNTER — Other Ambulatory Visit: Payer: Self-pay | Admitting: Cardiovascular Disease

## 2022-07-11 NOTE — Telephone Encounter (Signed)
Per OV note by Excell Seltzer on 11/17/21: Patient has stable symptoms. Suspect this is multifactorial with her advanced age, limited functional capacity due to advanced osteoarthritis, and diastolic dysfunction. She will continue on low-dose furosemide. She watches sodium in her diet and her blood pressure is well controlled. No changes are made in her medical regimen today. We discussed natural history and management strategies of diastolic heart failure today.  Cannot find in chart where anyone mentions increasing her lasix as she mentions, including hospital encounter. Reached out to patient to see who advised of her of this change and if she feels this dose is keeping her fluid under control. Awaiting call back.

## 2022-07-18 ENCOUNTER — Other Ambulatory Visit: Payer: Self-pay | Admitting: Family Medicine

## 2022-07-25 ENCOUNTER — Other Ambulatory Visit: Payer: Self-pay | Admitting: Family Medicine

## 2022-07-26 NOTE — Telephone Encounter (Signed)
Called patient again and she verified that she is taking Furosemide 20mg  daily. She does think at some point that she increased to 40mg  every other day, but states she is not having any issues with fluid and is only doing one a day. Advised that since last office note states she is on low-dose furosemide at 20mg /every day, that I would send it in for that and to call us back if she began having issues with her fluid.

## 2022-08-02 ENCOUNTER — Encounter: Payer: Self-pay | Admitting: Pharmacist

## 2022-08-02 NOTE — Progress Notes (Signed)
Patient previously followed by [facility] pharmacist. Per clinical review, no pharmacist appointment needed at this time. Care guide directed to contact patient and cancel appointment and notify pharmacy team of any patient concerns.  

## 2022-08-04 DIAGNOSIS — H353132 Nonexudative age-related macular degeneration, bilateral, intermediate dry stage: Secondary | ICD-10-CM | POA: Diagnosis not present

## 2022-08-05 ENCOUNTER — Encounter: Payer: Medicare Other | Admitting: Pharmacist

## 2022-08-10 ENCOUNTER — Encounter: Payer: Self-pay | Admitting: Behavioral Health

## 2022-08-10 ENCOUNTER — Ambulatory Visit (INDEPENDENT_AMBULATORY_CARE_PROVIDER_SITE_OTHER): Payer: Medicare Other | Admitting: Behavioral Health

## 2022-08-10 DIAGNOSIS — F4323 Adjustment disorder with mixed anxiety and depressed mood: Secondary | ICD-10-CM

## 2022-08-10 DIAGNOSIS — F411 Generalized anxiety disorder: Secondary | ICD-10-CM

## 2022-08-10 DIAGNOSIS — F331 Major depressive disorder, recurrent, moderate: Secondary | ICD-10-CM

## 2022-08-10 NOTE — Progress Notes (Addendum)
[facility] Counselor/Therapist Progress Note  Patient ID: [name] [name], MRN: [id number],    Date: 08/10/2022  Time Spent: 54 minutes, 10 AM to 10:54 AM.This session was held via video teletherapy. The patient consented to the video teletherapy and was located in her home during this session. She is aware it is the responsibility of the patient to secure confidentiality on her end of the session. The provider was in a private home office for the duration of this session.      Treatment Type: Individual Therapy  Reported Symptoms: Anxiety, depression  Mental Status Exam: Appearance:  Well Groomed     Behavior: Appropriate  Motor: Normal  Speech/Language:  Clear and Coherent  Affect: Appropriate  Mood: normal  Thought process: normal  Thought content:   WNL  Sensory/Perceptual disturbances:   WNL  Orientation: oriented to person, place, time/date, situation, day of week, and month of year  Attention: Good  Concentration: Good  Memory: WNL  Fund of knowledge:  Good  Insight:   Good  Judgment:  Good  Impulse Control: Good   Risk Assessment: Danger to Self:  No Self-injurious Behavior: No Danger to Others: No Duty to Warn:no Physical Aggression / Violence:No  Access to Firearms a concern: No  Gang Involvement:No   Subjective: The patient presents with symptoms of depression including low motivation and low energy and a general overall feeling of frustration.  She reports that even things that she typically enjoys doing she does not want to do.  She acknowledges struggling to not be able to do what she used to do physically but says that her mind is still thinking of things to do even if she cannot do them.  She still grieving the loss of her sister from November but still has another sister who is 61 years younger that she enjoys spending time with.  We talked about getting back to those things which she enjoyed doing her which would get her out of the house.   She still drives short distances such as to her sister's house or to the grocery store.  I encouraged her to reached out to her sister about going to lunch on a weekly basis if possible to get her out of the house.  She enjoys going to the grocery store and getting an Chief Technology Officer.  I encouraged her to do that to spend an hour or so away from home.  We looked at the little things that she can do at home including preparing some meals if it does not take long, baking etc.  Her son is eating healthier and therefore is not eating much like that but I encouraged her to make that for herself and to share with family friends if it was not a good fit for him.  We talked about cognitive reframing and focusing on the things that she can do as opposed to what she cannot do and I encouraged her to make a list of things that she would like to do and feels like she can so we can continue to work on that in the next session.  Interventions: Cognitive Behavioral Therapy and Ego-Supportive  Diagnosis: Adjustment disorder with mixed anxiety and depressed mood  Plan: I will meet with the patient every 2 to 3 weeks via care agility  Treatment plan: We will use cognitive behavioral therapy, ego supportive therapy and person centered therapy to help the patient reduce anxiety and depression by at least 50% with a target date of  January 17, 2023.  Goals will be for the patient to have less sadness and more motivation and energy as indicated by patient report and PHQ-9 scores.  We will also target improved mood and return to a healthier level of functioning within the bounds of what the patient can do physically.  We will process the causes and sources of depressed mood and learn ways to cope with them.  Interventions for depression will include using cognitive behavioral therapy to explore and replace unhealthy thoughts contributing to depression.  We will encourage sharing of her feelings related to adjustment issues  of aging and health issues as well as teach and encouraged the use of coping skills for management of depressive symptoms.  Goals for anxiety will be to improve her ability to manage anxiety and stress specifically related to changes in mobility and medical issues.  We will identify other causes for anxiety and work on resolving core conflicts contributing to anxiety.  Lastly we will introduce cognitive behavioral therapy reframing to help her manage thoughts and worrisome thinking contributing to feelings of anxiety.  Interventions include providing education about anxiety, facilitate problem solution skills and teach coping skills for managing anxiety symptoms.  We will also use cognitive behavioral therapy to change anxiety provoking thoughts and behavior patterns as well as use dialectical behavior therapy distress tolerance and mindfulness skills.  French Ana, [facility]

## 2022-08-10 NOTE — Progress Notes (Signed)
                Craig M Peters, LCMHC 

## 2022-08-11 DIAGNOSIS — H353131 Nonexudative age-related macular degeneration, bilateral, early dry stage: Secondary | ICD-10-CM | POA: Diagnosis not present

## 2022-08-11 DIAGNOSIS — D3132 Benign neoplasm of left choroid: Secondary | ICD-10-CM | POA: Diagnosis not present

## 2022-08-11 DIAGNOSIS — H5201 Hypermetropia, right eye: Secondary | ICD-10-CM | POA: Diagnosis not present

## 2022-08-15 ENCOUNTER — Ambulatory Visit (INDEPENDENT_AMBULATORY_CARE_PROVIDER_SITE_OTHER): Payer: Medicare Other | Admitting: Family Medicine

## 2022-08-15 ENCOUNTER — Encounter: Payer: Self-pay | Admitting: Family Medicine

## 2022-08-15 VITALS — BP 122/78 | HR 69 | Temp 97.2°F | Ht 63.5 in | Wt 195.0 lb

## 2022-08-15 DIAGNOSIS — F4323 Adjustment disorder with mixed anxiety and depressed mood: Secondary | ICD-10-CM | POA: Diagnosis not present

## 2022-08-15 DIAGNOSIS — M19042 Primary osteoarthritis, left hand: Secondary | ICD-10-CM

## 2022-08-15 DIAGNOSIS — M19041 Primary osteoarthritis, right hand: Secondary | ICD-10-CM | POA: Insufficient documentation

## 2022-08-15 DIAGNOSIS — R413 Other amnesia: Secondary | ICD-10-CM | POA: Diagnosis not present

## 2022-08-15 DIAGNOSIS — H6121 Impacted cerumen, right ear: Secondary | ICD-10-CM | POA: Diagnosis not present

## 2022-08-15 DIAGNOSIS — G500 Trigeminal neuralgia: Secondary | ICD-10-CM

## 2022-08-15 DIAGNOSIS — G5601 Carpal tunnel syndrome, right upper limb: Secondary | ICD-10-CM

## 2022-08-15 NOTE — Assessment & Plan Note (Signed)
Planning follow up with Dr Merlyn Lot

## 2022-08-15 NOTE — Assessment & Plan Note (Signed)
Today pt is fairly mentally sharp  Some word finding and speech issues may be worsened by gabapentin so will continue to wean  Anxiety worsens as well-discussed trying to get a psychiatrist to help   If still struggling will re eval with [facility] at next visit   Reassuring interview today

## 2022-08-15 NOTE — Assessment & Plan Note (Signed)
This may cause increase sensitivity in right ear  Unable to tolerate simple irrigation- did ref to ENT for this    Interested in slow wean of gabapentin and doing well so far (may be worsening memory and concentration) Will go town to 100 mg at bedtime only and update

## 2022-08-15 NOTE — Assessment & Plan Note (Signed)
Anxiety is worse lately  Continues zoloft 100 mg bid and wellbutrin xl 300 mg daily  Struggling  Affecting cognition (was mentally sharp today)  Reviewed stressors/ coping techniques/symptoms/ support sources/ tx options and side effects in detail today Suggested starting process to find a psychiatrist  Given some # to call for [facility]- will update from there  Strongly encouraged to continue counseling  No SI  Encouraged self care as well   Multiple stressors and health problems add to this

## 2022-08-15 NOTE — Assessment & Plan Note (Signed)
Right ear Recurrent  Unable to tolerate irrigation today due to ear sensitivity (? Due to chronic trigeminal neuralgia)   Instructed to use debrox or similar product 2-3 times per week Referred to ENT as well

## 2022-08-15 NOTE — Assessment & Plan Note (Signed)
Still struggling with numbness after CTS surgery in right hand  Some now in left  Tingling in fingers  Instructed to call Dr Merrilee Seashore office for follow up

## 2022-08-15 NOTE — Progress Notes (Signed)
Subjective:    Patient ID: [name] [name], female    DOB: 08-14-1934, 88 y.o.   MRN: [id number]  HPI  Wt Readings from Last 3 Encounters:  08/15/22 195 lb (88.5 kg)  06/24/22 193 lb (87.5 kg)  05/30/22 194 lb 4 oz (88.1 kg)   34.00 kg/m  Vitals:   08/15/22 1204  BP: 122/78  Pulse: 69  Temp: (!) 97.2 F (36.2 C)  SpO2: 98%    Pt presents for multiple health issues  Left arm numbness for months  Right ear pain and hearing loss  Memory issues  Some word finding/ difficulty putting words into sentences   Some numbness in both arms/hands  Right side- had carpal tunnel surgery in April / helped at first and now it is getting worse again  Also released 3rd finger -trigger finger  All fingers are feeling tingly now-both hands Also loss of grip strength due to pain (? Getting weaker due to not using )  Has a lot of arthritis  Sees Dr Merlyn Lot   Last visit was 2 mo post surgery in June    Ear irrigated in past  The canal is sore  Uses hearing aides  Has appt   Sleeps a lot  Is tired a lot      For mood  Taking wellbutrin xl 300 mg daily  Zoloft 100 mg bid    Very anxious  Seeing a therapist and she likes him / feels relatable    Gabapentin 200 mg tid for trigeminal neuralgia  Has cut to 100 mg twice daily      Lab Results  Component Value Date   TSH 3.180 06/29/2022   Lab Results  Component Value Date   WBC 5.0 06/29/2022   HGB 12.7 06/29/2022   HCT 39.8 06/29/2022   MCV 88 06/29/2022   PLT 245 06/29/2022   Lab Results  Component Value Date   ESRSEDRATE 12 06/27/2018   Lab Results  Component Value Date   VITAMINB12 530 06/29/2022   Last vitamin D Lab Results  Component Value Date   VD25OH 37.8 06/29/2022          Patient Active Problem List   Diagnosis Date Noted   Arthritis of both hands 08/15/2022   Cerumen impaction 06/24/2022   Right shoulder pain 12/28/2021   Esophageal dysphagia 12/06/2021   Current use of proton  pump inhibitor 05/12/2021   Precordial chest pain 05/07/2021   Aortic atherosclerosis (HCC) 05/07/2021   Coronary artery calcification seen on CT scan 05/07/2021   Myalgia 12/09/2019   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 12/09/2019   Chronic tension-type headache, not intractable 09/06/2019   Ageusia 09/06/2019   Anosmia 09/06/2019   Trigeminal neuralgia 03/15/2019   Mobility impaired 12/11/2018   Poor balance 08/13/2018   Memory loss 06/27/2018   Physical deconditioning 03/19/2018   Chronic heart failure with preserved ejection fraction (HCC) 02/09/2018   Prediabetes 01/26/2017   Hearing loss 01/19/2016   Carpal tunnel syndrome 08/31/2015   Encounter for Medicare annual wellness exam 06/04/2014   Obesity 06/04/2014   Estrogen deficiency 06/04/2014   Schatzki's ring 03/27/2014   OA (osteoarthritis) of knee 08/16/2011   Hemorrhoids 08/10/2011   Other dyspnea and respiratory abnormality 06/21/2010   Mixed incontinence 05/27/2010   OVERACTIVE BLADDER 03/12/2010   Lumbar degenerative disc disease 04/13/2009   Chronic GERD 10/15/2008   History of cardiovascular disorder 10/15/2008   Arthropathy, multiple sites 06/26/2008   EXTERNAL HEMORRHOIDS 02/01/2008  HIATAL HERNIA 02/01/2008   DIVERTICULOSIS OF COLON 02/01/2008   GASTRITIS, HX OF 02/01/2008   DYSPNEA 09/19/2007   Vitamin D deficiency 07/03/2007   Osteoporosis of lumbar spine 06/13/2007   Fatigue 06/13/2007   History of Helicobacter pylori infection 06/06/2006   Hyperlipidemia 06/06/2006   Generalized anxiety disorder 06/06/2006   Adjustment disorder with mixed anxiety and depressed mood 06/06/2006   MACULAR DEGENERATION 06/06/2006   H/O: CVA (cerebrovascular accident) 06/06/2006   OSTEOARTHRITIS 06/06/2006   Past Medical History:  Diagnosis Date   Alternating constipation and diarrhea    Anxiety state, unspecified    CHF (congestive heart failure) (HCC)    Complication of anesthesia    slow to wake  up with surgery several years ago    COVID-19 virus infection 12/2018   Depressive disorder, not elsewhere classified    Diaphragmatic hernia without mention of obstruction or gangrene    Difficulty sleeping    Diverticulosis of colon (without mention of hemorrhage)    Esophageal reflux    External hemorrhoids without mention of complication    Family history of adverse reaction to anesthesia    Dad very slow to wake upi   Fatigue    Frequency of urination    Gallstones    Headache    Helicobacter pylori (H. pylori)    Hiatal hernia    History of nuclear stress test    Myoview 05/2021: EF 70, normal perfusion; low risk   History of transfusion    Lumbar spondylosis    Macular degeneration    Macular degeneration (senile) of retina, unspecified    Osteoarthrosis, unspecified whether generalized or localized, unspecified site 08/11/2011   hx. "rhematoid arthritis", osteoarthritis, DDD, bursitis(hip)   Osteoporosis    Palpitations    PONV (postoperative nausea and vomiting)    Schatzki's ring    Shortness of breath 08/11/2011   with exertion only at present   Unspecified cerebral artery occlusion with cerebral infarction 08/11/2011   '97/ '06( TIA)-affected lt. side, slight weakness L side   Unspecified vitamin D deficiency    Past Surgical History:  Procedure Laterality Date   ABDOMINAL HYSTERECTOMY  08-11-11   APPENDECTOMY     BACK SURGERY  08-11-11   '11-hx. lumbar fusion with retained hardware   CARPAL TUNNEL RELEASE Right 05/02/2022   Procedure: RIGHT CARPAL TUNNEL RELEASE;  Surgeon: Betha Loa, MD;  Location: [facility];  Service: Orthopedics;  Laterality: Right;  Bier block   CATARACT EXTRACTION  08-11-11   Bilateral   ESOPHAGEAL DILATION     ESOPHAGOGASTRODUODENOSCOPY (EGD) WITH PROPOFOL N/A 12/06/2021   Procedure: ESOPHAGOGASTRODUODENOSCOPY (EGD) WITH PROPOFOL;  Surgeon: Jenel Lucks, MD;  Location: [facility];  Service: Gastroenterology;   Laterality: N/A;   KNEE SURGERY  08-11-11   rt. knee scope   PATELLAR TENDON REPAIR Left 02/22/2014   Procedure: PATELLA TENDON REPAIR;  Surgeon: Loanne Drilling, MD;  Location: [facility];  Service: Orthopedics;  Laterality: Left;   PATELLAR TENDON REPAIR Left 06/25/2014   Procedure: LEFT PATELLA TENDON REPAIR;  Surgeon: Ollen Gross, MD;  Location: [facility];  Service: Orthopedics;  Laterality: Left;   SAVORY DILATION N/A 12/06/2021   Procedure: SAVORY DILATION;  Surgeon: Jenel Lucks, MD;  Location: [facility];  Service: Gastroenterology;  Laterality: N/A;   TOTAL KNEE ARTHROPLASTY  08/16/2011   Procedure: TOTAL KNEE ARTHROPLASTY;  Surgeon: Loanne Drilling, MD;  Location: [facility];  Service: Orthopedics;  Laterality: Right;   TOTAL KNEE ARTHROPLASTY  Left 01/27/2014   Procedure: LEFT TOTAL KNEE ARTHROPLASTY;  Surgeon: Loanne Drilling, MD;  Location: [facility];  Service: Orthopedics;  Laterality: Left;   TRIGGER FINGER RELEASE Right 05/02/2022   Procedure: RELEASE TRIGGER FINGER/A-1 PULLEY RIGHT LONG FINGER;  Surgeon: Betha Loa, MD;  Location: [facility];  Service: Orthopedics;  Laterality: Right;  Bier block   TUBAL LIGATION     Social History   Tobacco Use   Smoking status: Never   Smokeless tobacco: Never  Vaping Use   Vaping status: Never Used  Substance Use Topics   Alcohol use: No    Alcohol/week: 0.0 standard drinks of alcohol   Drug use: No   Family History  Problem Relation Age of Onset   Heart disease Father    Coronary artery disease Brother    Diabetes Brother    Heart disease Brother    Pancreatic cancer Daughter    Diabetes Sister    Colon cancer Neg Hx    Colon polyps Neg Hx    Esophageal cancer Neg Hx    Gallbladder disease Neg Hx    Allergies  Allergen Reactions   Alendronate Sodium Other (See Comments)    REACTION: JAW PAIN   Celecoxib Other (See Comments)    REACTION: GI UPSET    Chocolate Diarrhea   Omeprazole Nausea Only   Rabeprazole  Nausea And Vomiting   Latex Itching and Rash   Sulfa Antibiotics Rash   Current Outpatient Medications on File Prior to Visit  Medication Sig Dispense Refill   APPLE CIDER VINEGAR PO Take 2 tablets by mouth at bedtime.     atorvastatin (LIPITOR) 10 MG tablet TAKE 1 TABLET DAILY 90 tablet 0   buPROPion (WELLBUTRIN XL) 150 MG 24 hr tablet Take 2 tablets (300 mg total) by mouth daily. 180 tablet 1   calcium carbonate (TUMS - DOSED IN MG ELEMENTAL CALCIUM) 500 MG chewable tablet Chew 1 tablet (200 mg of elemental calcium total) by mouth 4 (four) times daily as needed for indigestion or heartburn. 10 tablet 0   clopidogrel (PLAVIX) 75 MG tablet Take 1 tablet (75 mg total) by mouth daily. 90 tablet 3   diclofenac sodium (VOLTAREN) 1 % GEL Apply 1 application  topically at bedtime.     estradiol (ESTRACE) 0.1 MG/GM vaginal cream USE A PEA SIZED AMOUNT OF CREAM VAGINALLY TWICE PER WEEK 42.5 g 0   famotidine (PEPCID) 40 MG tablet TAKE 1 TABLET DAILY 90 tablet 2   furosemide (LASIX) 20 MG tablet TAKE 1 TABLET DAILY 90 tablet 3   gabapentin (NEURONTIN) 100 MG capsule TAKE 2 CAPSULES THREE TIMES A DAY (Patient taking differently: 100 mg at bedtime.) 540 capsule 0   HYDROcodone-acetaminophen (NORCO/VICODIN) 5-325 MG tablet 1-2 tabs PO q6 hours prn pain 15 tablet 0   Liniments (ABSORBINE ARTHRITIS STRENGTH EX) Apply 1 application  topically daily as needed (Back pain).     loperamide (IMODIUM) 2 MG capsule Take 2 mg by mouth as needed for diarrhea or loose stools.     metoprolol succinate (TOPROL-XL) 25 MG 24 hr tablet TAKE ONE-HALF (1/2) TABLET AT BEDTIME 45 tablet 3   Multiple Vitamins-Minerals (PRESERVISION AREDS PO) Take 1 capsule by mouth 2 (two) times daily.     pantoprazole (PROTONIX) 40 MG tablet Take 1 tablet (40 mg total) by mouth 2 (two) times daily. 60 tablet 5   Polyethyl Glycol-Propyl Glycol (SYSTANE OP) Apply 1-2 drops to eye 3 (three) times daily as needed (dry  eyes.).      sertraline  (ZOLOFT) 100 MG tablet TAKE 2 TABLETS DAILY 180 tablet 2   nitroGLYCERIN (NITROSTAT) 0.4 MG SL tablet Place 1 tablet (0.4 mg total) under the tongue every 5 (five) minutes as needed for chest pain. 25 tablet 3   No current facility-administered medications on file prior to visit.    Review of Systems  Constitutional:  Positive for fatigue. Negative for activity change, appetite change, fever and unexpected weight change.  HENT:  Negative for congestion, ear pain, rhinorrhea, sinus pressure and sore throat.   Eyes:  Negative for pain, redness and visual disturbance.  Respiratory:  Negative for cough, shortness of breath and wheezing.   Cardiovascular:  Negative for chest pain and palpitations.  Gastrointestinal:  Negative for abdominal pain, blood in stool, constipation and diarrhea.  Endocrine: Negative for polydipsia and polyuria.  Genitourinary:  Negative for dysuria, frequency and urgency.  Musculoskeletal:  Positive for arthralgias, back pain and gait problem. Negative for myalgias.  Skin:  Negative for pallor and rash.  Allergic/Immunologic: Negative for environmental allergies.  Neurological:  Negative for dizziness, syncope and headaches.  Hematological:  Negative for adenopathy. Does not bruise/bleed easily.  Psychiatric/Behavioral:  Positive for decreased concentration, dysphoric mood and sleep disturbance. Negative for agitation, confusion and suicidal ideas. The patient is nervous/anxious.        Objective:   Physical Exam Constitutional:      General: She is not in acute distress.    Appearance: Normal appearance. She is well-developed. She is obese. She is not ill-appearing or diaphoretic.  HENT:     Head: Normocephalic and atraumatic.     Right Ear: There is impacted cerumen.     Ears:     Comments: Scant cerumen in left canal/ TM appears normal     Mouth/Throat:     Mouth: Mucous membranes are moist.  Eyes:     Conjunctiva/sclera: Conjunctivae normal.     Pupils:  Pupils are equal, round, and reactive to light.  Neck:     Thyroid: No thyromegaly.     Vascular: No carotid bruit or JVD.  Cardiovascular:     Rate and Rhythm: Normal rate and regular rhythm.     Heart sounds: Normal heart sounds.     No gallop.  Pulmonary:     Effort: Pulmonary effort is normal. No respiratory distress.     Breath sounds: Normal breath sounds. No wheezing or rales.  Abdominal:     General: There is no distension or abdominal bruit.     Palpations: Abdomen is soft.  Musculoskeletal:     Cervical back: Normal range of motion and neck supple.     Right lower leg: No edema.     Left lower leg: No edema.     Comments: OA changes in both hands with some distal pip enlargement  Tenderness of right wrist   No triggering of fingers   Grip is limited by pain   Lymphadenopathy:     Cervical: No cervical adenopathy.  Skin:    General: Skin is warm and dry.     Coloration: Skin is not jaundiced or pale.     Findings: No bruising or rash.  Neurological:     Mental Status: She is alert.     Coordination: Coordination normal.     Deep Tendon Reflexes: Reflexes are normal and symmetric. Reflexes normal.  Psychiatric:        Attention and Perception: Attention normal.  Mood and Affect: Mood is anxious.        Speech: Speech normal.        Behavior: Behavior normal.        Thought Content: Thought content normal.        Cognition and Memory: Cognition and memory normal.     Comments: Mentally sharp today  Candidly discusses symptoms and stressors             Assessment & Plan:   Problem List Items Addressed This Visit       Nervous and Auditory   Trigeminal neuralgia    This may cause increase sensitivity in right ear  Unable to tolerate simple irrigation- did ref to ENT for this    Interested in slow wean of gabapentin and doing well so far (may be worsening memory and concentration) Will go town to 100 mg at bedtime only and update          Cerumen impaction    Right ear Recurrent  Unable to tolerate irrigation today due to ear sensitivity (? Due to chronic trigeminal neuralgia)   Instructed to use debrox or similar product 2-3 times per week Referred to ENT as well      Relevant Orders   Ambulatory referral to ENT   Carpal tunnel syndrome    Still struggling with numbness after CTS surgery in right hand  Some now in left  Tingling in fingers  Instructed to call Dr Merrilee Seashore office for follow up        Musculoskeletal and Integument   Arthritis of both hands    Planning follow up with Dr Merlyn Lot        Other   Memory loss    Today pt is fairly mentally sharp  Some word finding and speech issues may be worsened by gabapentin so will continue to wean  Anxiety worsens as well-discussed trying to get a psychiatrist to help   If still struggling will re eval with MMSE at next visit   Reassuring interview today      Adjustment disorder with mixed anxiety and depressed mood - Primary    Anxiety is worse lately  Continues zoloft 100 mg bid and wellbutrin xl 300 mg daily  Struggling  Affecting cognition (was mentally sharp today)  Reviewed stressors/ coping techniques/symptoms/ support sources/ tx options and side effects in detail today Suggested starting process to find a psychiatrist  Given some # to call for [facility]- will update from there  Strongly encouraged to continue counseling  No SI  Encouraged self care as well   Multiple stressors and health problems add to this

## 2022-08-15 NOTE — Patient Instructions (Addendum)
Call Dr Merrilee Seashore office regarding your hand/arm symptoms   You have a wax impaction in the right ear  Use debrox (or similar product) over the counter 2-3 times weekly to loosen this up , then it may be easier to remove later   I put the referral in for ENT office for the ear wax  Please let us know if you don't hear in 1-2 weeks     Go ahead and cut the dose of gabapentin to 100 mg at bedtime only   I am convinced also that your worsened anxiety is affecting cognition / memory and speech I would like to start looking for a psychiatrist to help with your mental health medications    [facility]/ Dr Regan Lemming Address: [address] [place]. [place], [place] [address] Phone: [phone]  [facility] Address: [address], [place], [place] [address] Phone: [phone]  [facility] [address] [place], [place] [address] [phone]    Call to schedule your bone density test (DEXA)  You have an order for:  []   2D Mammogram  []   3D Mammogram  [x]   Bone Density     Please call for appointment:   []   [facility]  [address] [place] [address]  [phone]  []   [facility])   [address]  [place], [place] [address]  [phone]  []   [facility]      [address] [place], [place]        [phone]         []   [facility]  [address] [place], [place]  [phone]  [x]  [facility]   [address]   [place], [place] [address]  [phone]  []  [facility]  [address] [place], [place] [address] [phone]    Make sure to wear two piece clothing  No lotions powders or deodorants the day of the  appointment Make sure to bring picture ID and insurance card.  Bring list of medications you are currently taking including any supplements.   Schedule your screening mammogram through MyChart!   Select [facility] imaging sites can now be scheduled through MyChart.  Log into your MyChart account.  Go to 'Visit' (or 'Appointments' if  on mobile App) --> Schedule an  Appointment  Under 'Select a Reason for Visit' choose the Mammogram  Screening option.  Complete the pre-visit questions  and select the time and place that  best fits your schedule

## 2022-08-17 DIAGNOSIS — H903 Sensorineural hearing loss, bilateral: Secondary | ICD-10-CM | POA: Diagnosis not present

## 2022-08-17 DIAGNOSIS — H6123 Impacted cerumen, bilateral: Secondary | ICD-10-CM | POA: Diagnosis not present

## 2022-08-22 ENCOUNTER — Ambulatory Visit (INDEPENDENT_AMBULATORY_CARE_PROVIDER_SITE_OTHER)
Admission: RE | Admit: 2022-08-22 | Discharge: 2022-08-22 | Disposition: A | Discharge status: Home / Self Care | Payer: Medicare Other | Source: Ambulatory Visit | Attending: Family Medicine | Admitting: Family Medicine

## 2022-08-22 DIAGNOSIS — E2839 Other primary ovarian failure: Secondary | ICD-10-CM | POA: Diagnosis not present

## 2022-08-30 DIAGNOSIS — L820 Inflamed seborrheic keratosis: Secondary | ICD-10-CM | POA: Diagnosis not present

## 2022-08-30 DIAGNOSIS — L603 Nail dystrophy: Secondary | ICD-10-CM | POA: Diagnosis not present

## 2022-08-30 DIAGNOSIS — D485 Neoplasm of uncertain behavior of skin: Secondary | ICD-10-CM | POA: Diagnosis not present

## 2022-09-01 ENCOUNTER — Ambulatory Visit (INDEPENDENT_AMBULATORY_CARE_PROVIDER_SITE_OTHER): Payer: Medicare Other | Admitting: Behavioral Health

## 2022-09-01 ENCOUNTER — Encounter: Payer: Self-pay | Admitting: Behavioral Health

## 2022-09-01 DIAGNOSIS — F4323 Adjustment disorder with mixed anxiety and depressed mood: Secondary | ICD-10-CM | POA: Diagnosis not present

## 2022-09-01 NOTE — Progress Notes (Signed)
[facility] Counselor/Therapist Progress Note  Patient ID: [name] [name], MRN: [id number],    Date: 09/01/2022  Time Spent: 50 minutes, 10:03 AM to 10:53 AM.This session was held via video teletherapy. The patient consented to the video teletherapy and was located in her home during this session. She is aware it is the responsibility of the patient to secure confidentiality on her end of the session. The provider was in a private home office for the duration of this session.      Treatment Type: Individual Therapy  Reported Symptoms: Anxiety, depression  Mental Status Exam: Appearance:  Well Groomed     Behavior: Appropriate  Motor: Normal  Speech/Language:  Clear and Coherent  Affect: Appropriate  Mood: normal  Thought process: normal  Thought content:   WNL  Sensory/Perceptual disturbances:   WNL  Orientation: oriented to person, place, time/date, situation, day of week, and month of year  Attention: Good  Concentration: Good  Memory: WNL  Fund of knowledge:  Good  Insight:   Good  Judgment:  Good  Impulse Control: Good   Risk Assessment: Danger to Self:  No Self-injurious Behavior: No Danger to Others: No Duty to Warn:no Physical Aggression / Violence:No  Access to Firearms a concern: No  Gang Involvement:No   Subjective: For the most part the patient says the past few weeks have been fairly good.  There are still medical issues which she is addressing including having to have a dermatology procedure on her ear yesterday.  She jokingly said I would not believe how many doctors appointments that she has.  She has been trying to make the most of her time.  Her children are reluctant for her to drive but she is getting out as much as she can with her caregiver or her family.  She met with her sister and nieces and daughter for lunch.  She is going out to eat with her son.  She baked the pound cake, peanut butter, likely processed in the last session.  She  fried some fresh okra which she said was delicious.  She is trying to do the simple things which bring her joy.  She rearranged her living area to make it more comfortable and assessable for her but also the benefits her physically as she is on a walker most of the time.  She presented with bright affect today laughing easily and I encouraged her to share that sense of humor and her personality with as many people as she can.  She acknowledges that there are things that she is adjusting to as she ages but feels that she is doing all that she can to improve her mood and face life in a positive way. Interventions: Cognitive Behavioral Therapy and Ego-Supportive  Diagnosis: Adjustment disorder with mixed anxiety and depressed mood  Plan: I will meet with the patient every 2 to 3 weeks via care agility  Treatment plan: We will use cognitive behavioral therapy, ego supportive therapy and person centered therapy to help the patient reduce anxiety and depression by at least 50% with a target date of January 17, 2023.  Goals will be for the patient to have less sadness and more motivation and energy as indicated by patient report and PHQ-9 scores.  We will also target improved mood and return to a healthier level of functioning within the bounds of what the patient can do physically.  We will process the causes and sources of depressed mood and learn ways to cope  with them.  Interventions for depression will include using cognitive behavioral therapy to explore and replace unhealthy thoughts contributing to depression.  We will encourage sharing of her feelings related to adjustment issues of aging and health issues as well as teach and encouraged the use of coping skills for management of depressive symptoms.  Goals for anxiety will be to improve her ability to manage anxiety and stress specifically related to changes in mobility and medical issues.  We will identify other causes for anxiety and work on resolving  core conflicts contributing to anxiety.  Lastly we will introduce cognitive behavioral therapy reframing to help her manage thoughts and worrisome thinking contributing to feelings of anxiety.  Interventions include providing education about anxiety, facilitate problem solution skills and teach coping skills for managing anxiety symptoms.  We will also use cognitive behavioral therapy to change anxiety provoking thoughts and behavior patterns as well as use dialectical behavior therapy distress tolerance and mindfulness skills.  French Ana, [facility]                  French Ana, [facility]

## 2022-09-14 DIAGNOSIS — G5622 Lesion of ulnar nerve, left upper limb: Secondary | ICD-10-CM | POA: Diagnosis not present

## 2022-09-14 DIAGNOSIS — G5603 Carpal tunnel syndrome, bilateral upper limbs: Secondary | ICD-10-CM | POA: Diagnosis not present

## 2022-09-14 DIAGNOSIS — G5613 Other lesions of median nerve, bilateral upper limbs: Secondary | ICD-10-CM | POA: Diagnosis not present

## 2022-10-04 ENCOUNTER — Other Ambulatory Visit: Payer: Self-pay | Admitting: *Deleted

## 2022-10-04 MED ORDER — ESTRADIOL 0.1 MG/GM VA CREA: TOPICAL_CREAM | VAGINAL | 1 refills | Status: DC

## 2022-10-04 NOTE — Telephone Encounter (Signed)
Last filled on 03/14/22 #42.5 g with 0 refill, last OV was for memory issues on 08/15/22

## 2022-10-06 ENCOUNTER — Encounter: Payer: Self-pay | Admitting: Behavioral Health

## 2022-10-06 ENCOUNTER — Ambulatory Visit (INDEPENDENT_AMBULATORY_CARE_PROVIDER_SITE_OTHER): Payer: Medicare Other | Admitting: Behavioral Health

## 2022-10-06 DIAGNOSIS — F4323 Adjustment disorder with mixed anxiety and depressed mood: Secondary | ICD-10-CM | POA: Diagnosis not present

## 2022-10-06 NOTE — Progress Notes (Signed)
[facility] Counselor/Therapist Progress Note  Patient ID: [name] [name], MRN: [id number],    Date: 10/06/2022  Time Spent: 58 minutes, 10:01 AM to 10:59 AM.This session was held via video teletherapy. The patient consented to the video teletherapy and was located in her home during this session. She is aware it is the responsibility of the patient to secure confidentiality on her end of the session. The provider was in a private home office for the duration of this session.      Treatment Type: Individual Therapy  Reported Symptoms: Anxiety, depression  Mental Status Exam: Appearance:  Well Groomed     Behavior: Appropriate  Motor: Normal  Speech/Language:  Clear and Coherent  Affect: Appropriate  Mood: normal  Thought process: normal  Thought content:   WNL  Sensory/Perceptual disturbances:   WNL  Orientation: oriented to person, place, time/date, situation, day of week, and month of year  Attention: Good  Concentration: Good  Memory: WNL  Fund of knowledge:  Good  Insight:   Good  Judgment:  Good  Impulse Control: Good   Risk Assessment: Danger to Self:  No Self-injurious Behavior: No Danger to Others: No Duty to Warn:no Physical Aggression / Violence:No  Access to Firearms a concern: No  Gang Involvement:No   Subjective: The patient was feeling down today.  Overall she says she is trying to adjust to the fact that she cannot do as much as he used to do and it takes a lot more effort to do things than it used to.  She is tired.  She did spend the weekend with some of her grandchildren and their spouses or partners and had a great time.  They went out on the property and side-by-side's.  They did things around the cabin.  They looked at old photographs.  She said that it is a cabin that she and her husband built in 1972 and there have been a lot of great memories there.  Part of the sadness came from the fact that her husband is deceased and a lot of the  things reminded her of him but that was not outweighed by the joy that she had with her grandchildren.  I encouraged her to do that more often especially with her grandchildren to strengthen those relationships but also to put her in touch with who she is and was in to get her out of the house.  We talked about her adjustment difficulties being valid and normal and the feelings that come along with that.  We also looked at reframing some of those thoughts in a more positive way such as the wisdom love that she offers to her children and her grandchildren.  We validated her feelings of grief that bubble to the surface.  We looked at ways that she could continue to feel productive to her family.  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  She bakes and gives that away to other people because it makes them smile and she enjoys it too.  We talked about finding success in the things that she can do and challenging thoughts that are otherwise.   She does contract for safety having no thoughts of hurting herself or anyone else.  Interventions: Cognitive Behavioral Therapy and Ego-Supportive  Diagnosis: Adjustment disorder with mixed anxiety and depressed mood  Plan: I will meet with the patient every 2 to 3 weeks via care agility  Treatment  plan: We will use cognitive behavioral therapy, ego supportive therapy and person centered therapy to help the patient reduce anxiety and depression by at least 50% with a target date of January 17, 2023.  Goals will be for the patient to have less sadness and more motivation and energy as indicated by patient report and PHQ-9 scores.  We will also target improved mood and return to a healthier level of functioning within the bounds of what the patient can do physically.  We will process the causes and sources of depressed mood and learn ways to cope with them.  Interventions for depression will include using cognitive  behavioral therapy to explore and replace unhealthy thoughts contributing to depression.  We will encourage sharing of her feelings related to adjustment issues of aging and health issues as well as teach and encouraged the use of coping skills for management of depressive symptoms.  Goals for anxiety will be to improve her ability to manage anxiety and stress specifically related to changes in mobility and medical issues.  We will identify other causes for anxiety and work on resolving core conflicts contributing to anxiety.  Lastly we will introduce cognitive behavioral therapy reframing to help her manage thoughts and worrisome thinking contributing to feelings of anxiety.  Interventions include providing education about anxiety, facilitate problem solution skills and teach coping skills for managing anxiety symptoms.  We will also use cognitive behavioral therapy to change anxiety provoking thoughts and behavior patterns as well as use dialectical behavior therapy distress tolerance and mindfulness skills. Progress: 35%  French Ana, [facility]                  French Ana, [facility]               French Ana, [facility]

## 2022-10-18 ENCOUNTER — Other Ambulatory Visit: Payer: Self-pay | Admitting: *Deleted

## 2022-10-18 MED ORDER — ATORVASTATIN CALCIUM 10 MG PO TABS: ORAL_TABLET | ORAL | 2 refills | Status: DC

## 2022-11-02 DIAGNOSIS — H353132 Nonexudative age-related macular degeneration, bilateral, intermediate dry stage: Secondary | ICD-10-CM | POA: Diagnosis not present

## 2022-11-11 ENCOUNTER — Telehealth: Payer: Self-pay | Admitting: Physician Assistant

## 2022-11-11 MED ORDER — PANTOPRAZOLE SODIUM 40 MG PO TBEC: 40.0000 mg | DELAYED_RELEASE_TABLET | Freq: Two times a day (BID) | ORAL | 3 refills | Status: DC

## 2022-11-11 NOTE — Telephone Encounter (Signed)
Refill sent to pharmacy.   

## 2022-11-11 NOTE — Telephone Encounter (Signed)
Inbound call from patients daughter stating patient needs refill for Protonix. Please advise.

## 2022-11-24 ENCOUNTER — Ambulatory Visit: Payer: Medicare Other | Admitting: Behavioral Health

## 2022-11-25 ENCOUNTER — Ambulatory Visit: Payer: Medicare Other | Attending: Cardiovascular Disease | Admitting: Cardiovascular Disease

## 2022-11-25 ENCOUNTER — Encounter: Payer: Self-pay | Admitting: Cardiovascular Disease

## 2022-11-25 VITALS — BP 120/62 | HR 73 | Ht 63.5 in | Wt 194.2 lb

## 2022-11-25 DIAGNOSIS — R0602 Shortness of breath: Secondary | ICD-10-CM | POA: Diagnosis not present

## 2022-11-25 DIAGNOSIS — I700 Atherosclerosis of aorta: Secondary | ICD-10-CM | POA: Diagnosis not present

## 2022-11-25 DIAGNOSIS — I5032 Chronic diastolic (congestive) heart failure: Secondary | ICD-10-CM | POA: Diagnosis not present

## 2022-11-25 NOTE — Assessment & Plan Note (Signed)
Chronic, multifactorial.  Appears clinically stable.  No changes reported over the past year.

## 2022-11-25 NOTE — Assessment & Plan Note (Signed)
Treated with a statin drug.  On clopidogrel.  She has been managed with clopidogrel ever since she had a TIA.

## 2022-11-25 NOTE — Progress Notes (Signed)
Cardiology Office Note:    Date:  11/25/2022   ID:  [name] [name], DOB Sep 24, 1934, MRN [id number]  PCP:  Judy Pimple, MD   [facility] Providers Cardiologist:  Tonny Bollman, MD     Referring MD: Judy Pimple, MD   Chief Complaint  Patient presents with   Shortness of Breath    History of Present Illness:    [name] [name] is a 88 y.o. female with a hx of:  Palpitations  Hx of TIA Chronic shortness of breath  Myoview 02/2018: low risk  Echocardiogram 02/2018: EF 60-65, Gr 2 DD Echocardiogram 02/2021: EF 60-65, Gr 2 DD, mild to mod TR Heart failure with preserved ejection fraction  Coronary Ca2+ on Chest CT Aortic atherosclerosis  Hyperlipidemia  Hx of CVA MRI 11/21: remote R MCA area infarct DJD  Anxiety  GERD  Macular degeneration  Osteoporosis  Hx of COVID-19 in 12/2018 Long COVID  The patient just returned from a week at the beach and she had a really good time.  Today, she denies symptoms of palpitations, chest pain, orthopnea, PND, lower extremity edema, dizziness, or syncope.  She has chronic shortness of breath with exertion and reports no change over the past year.  She has no resting dyspnea.  We discussed her diagnosis of heart failure with preserved ejection fraction today and she understands the importance of sodium restriction, weight management, and blood pressure control.  Current Medications: Current Meds  Medication Sig   APPLE CIDER VINEGAR PO Take 2 tablets by mouth at bedtime.   atorvastatin (LIPITOR) 10 MG tablet TAKE 1 TABLET DAILY   buPROPion (WELLBUTRIN XL) 150 MG 24 hr tablet Take 2 tablets (300 mg total) by mouth daily.   calcium carbonate (TUMS - DOSED IN MG ELEMENTAL CALCIUM) 500 MG chewable tablet Chew 1 tablet (200 mg of elemental calcium total) by mouth 4 (four) times daily as needed for indigestion or heartburn.   clopidogrel (PLAVIX) 75 MG tablet Take 1 tablet (75 mg total) by mouth daily.   diclofenac sodium  (VOLTAREN) 1 % GEL Apply 1 application  topically at bedtime.   estradiol (ESTRACE) 0.1 MG/GM vaginal cream Use a pea sized amount of cream vaginally twice per week   famotidine (PEPCID) 40 MG tablet TAKE 1 TABLET DAILY   furosemide (LASIX) 20 MG tablet TAKE 1 TABLET DAILY   gabapentin (NEURONTIN) 100 MG capsule TAKE 2 CAPSULES THREE TIMES A DAY (Patient taking differently: 100 mg at bedtime.)   HYDROcodone-acetaminophen (NORCO/VICODIN) 5-325 MG tablet 1-2 tabs PO q6 hours prn pain   Liniments (ABSORBINE ARTHRITIS STRENGTH EX) Apply 1 application  topically daily as needed (Back pain).   loperamide (IMODIUM) 2 MG capsule Take 2 mg by mouth as needed for diarrhea or loose stools.   metoprolol succinate (TOPROL-XL) 25 MG 24 hr tablet TAKE ONE-HALF (1/2) TABLET AT BEDTIME   Multiple Vitamins-Minerals (PRESERVISION AREDS PO) Take 1 capsule by mouth 2 (two) times daily.   pantoprazole (PROTONIX) 40 MG tablet Take 1 tablet (40 mg total) by mouth 2 (two) times daily.   Polyethyl Glycol-Propyl Glycol (SYSTANE OP) Apply 1-2 drops to eye 3 (three) times daily as needed (dry eyes.).    sertraline (ZOLOFT) 100 MG tablet TAKE 2 TABLETS DAILY     Allergies:   Alendronate sodium, Celecoxib, Chocolate, Omeprazole, Rabeprazole, Latex, and Sulfa antibiotics   ROS:   Please see the history of present illness.    Positive for easy bruising.  All other  systems reviewed and are negative.  EKGs/Labs/Other Studies Reviewed:    The following studies were reviewed today: Cardiac Studies & Procedures     STRESS TESTS  MYOCARDIAL PERFUSION IMAGING 05/20/2021  Narrative   The study is normal. The study is low risk.   No ST deviation was noted.   Left ventricular function is normal. Nuclear stress EF: 70 %. The left ventricular ejection fraction is hyperdynamic (>65%). End diastolic cavity size is normal. End systolic cavity size is normal.   Prior study available for comparison from 06/24/2015. No changes compared  to prior study. Low risk, normal perfusion, LVEF 69%  Normal perfusion. LVEF 70% with normal wall motion. This is a low risk study. No change compared to prior study in 2017.   ECHOCARDIOGRAM  ECHOCARDIOGRAM COMPLETE 03/05/2021  Narrative ECHOCARDIOGRAM REPORT    Patient Name:   [name] [name] Date of Exam: 03/05/2021 Medical Rec #:  [id number]         Height:       65.0 in Accession #:    [id number]        Weight:       199.0 lb Date of Birth:  1934/01/27        BSA:          1.974 m Patient Age:    86 years          BP:           116/64 mmHg Patient Gender: F                 HR:           60 bpm. Exam Location:  [address]  Procedure: 2D Echo, Cardiac Doppler and Color Doppler  Indications:    I50.32 CHF  History:        Patient has prior history of Echocardiogram examinations, most recent 01/27/2020. CHF, Signs/Symptoms:Shortness of Breath and Palpitations; Risk Factors:Dyslipidemia and Obesity.  Sonographer:    Samule Ohm RDCS Referring Phys: [id number] TESSA N CONTE  IMPRESSIONS   1. Left ventricular ejection fraction, by estimation, is 60 to 65%. The left ventricle has normal function. The left ventricle has no regional wall motion abnormalities. There is mild left ventricular hypertrophy. Left ventricular diastolic parameters are consistent with Grade II diastolic dysfunction (pseudonormalization). 2. Right ventricular systolic function is normal. The right ventricular size is normal. 3. Left atrial size was mildly dilated. 4. The mitral valve is grossly normal. No evidence of mitral valve regurgitation. 5. Tricuspid valve regurgitation is mild to moderate. 6. Aortic valve regurgitation is mild. Aortic valve sclerosis/calcification is present, without any evidence of aortic stenosis. 7. The inferior vena cava is normal in size with greater than 50% respiratory variability, suggesting right atrial pressure of 3 mmHg. 8. Cannot exclude a small PFO.  Comparison(s):  No significant change from prior study.  FINDINGS Left Ventricle: Left ventricular ejection fraction, by estimation, is 60 to 65%. The left ventricle has normal function. The left ventricle has no regional wall motion abnormalities. The left ventricular internal cavity size was normal in size. There is mild left ventricular hypertrophy. Left ventricular diastolic parameters are consistent with Grade II diastolic dysfunction (pseudonormalization).  Right Ventricle: The right ventricular size is normal. No increase in right ventricular wall thickness. Right ventricular systolic function is normal.  Left Atrium: Left atrial size was mildly dilated.  Right Atrium: Right atrial size was normal in size.  Pericardium: There is no evidence of pericardial effusion.  Mitral  Valve: The mitral valve is grossly normal. No evidence of mitral valve regurgitation.  Tricuspid Valve: The tricuspid valve is grossly normal. Tricuspid valve regurgitation is mild to moderate.  Aortic Valve: Aortic valve regurgitation is mild. Aortic regurgitation PHT measures 503 msec. Aortic valve sclerosis/calcification is present, without any evidence of aortic stenosis.  Pulmonic Valve: The pulmonic valve was not well visualized. Pulmonic valve regurgitation is trivial.  Aorta: The aortic root and ascending aorta are structurally normal, with no evidence of dilitation.  Venous: The inferior vena cava is normal in size with greater than 50% respiratory variability, suggesting right atrial pressure of 3 mmHg.  IAS/Shunts: Cannot exclude a small PFO.   LEFT VENTRICLE PLAX 2D LVIDd:         4.00 cm   Diastology LVIDs:         2.90 cm   LV e' medial:    7.94 cm/s LV PW:         1.20 cm   LV E/e' medial:  11.8 LV IVS:        1.30 cm   LV e' lateral:   9.68 cm/s LVOT diam:     1.60 cm   LV E/e' lateral: 9.7 LVOT Area:     2.01 cm   RIGHT VENTRICLE             IVC RV S prime:     14.80 cm/s  IVC diam: 1.20 cm TAPSE  (M-mode): 2.4 cm RVSP:           33.0 mmHg  LEFT ATRIUM             Index        RIGHT ATRIUM           Index LA diam:        4.50 cm 2.28 cm/m   RA Pressure: 3.00 mmHg LA Vol (A2C):   59.2 ml 29.99 ml/m  RA Area:     13.00 cm LA Vol (A4C):   66.7 ml 33.79 ml/m  RA Volume:   31.00 ml  15.70 ml/m LA Biplane Vol: 66.4 ml 33.64 ml/m AORTIC VALVE AI PHT:      503 msec  AORTA Ao Root diam: 3.70 cm Ao Asc diam:  3.50 cm  MITRAL VALVE               TRICUSPID VALVE MV Area (PHT): 3.27 cm    TR Peak grad:   30.0 mmHg MV Decel Time: 232 msec    TR Vmax:        274.00 cm/s MV E velocity: 93.80 cm/s  Estimated RAP:  3.00 mmHg MV A velocity: 67.10 cm/s  RVSP:           33.0 mmHg MV E/A ratio:  1.40 SHUNTS Systemic Diam: 1.60 cm  Carolan Clines Electronically signed by Carolan Clines Signature Date/Time: 03/05/2021/12:50:59 PM    Final             EKG:        Recent Labs: 06/29/2022: ALT 11; BUN 18; Creatinine, Ser 0.98; Hemoglobin 12.7; Platelets 245; Potassium 4.1; Sodium 143; TSH 3.180  Recent Lipid Panel    Component Value Date/Time   CHOL 189 06/29/2022 1118   TRIG 115 06/29/2022 1118   HDL 79 06/29/2022 1118   CHOLHDL 2.4 06/29/2022 1118   CHOLHDL 3 03/19/2020 1223   VLDL 24.6 03/19/2020 1223   LDLCALC 90 06/29/2022 1118     Risk Assessment/Calculations:  Physical Exam:    VS:  BP 120/62   Pulse 73   Ht 5' 3.5" (1.613 m)   Wt 194 lb 3.2 oz (88.1 kg)   SpO2 94%   BMI 33.86 kg/m     Wt Readings from Last 3 Encounters:  11/25/22 194 lb 3.2 oz (88.1 kg)  08/15/22 195 lb (88.5 kg)  06/24/22 193 lb (87.5 kg)     GEN:  Well nourished, well developed in no acute distress HEENT: Normal NECK: No JVD; No carotid bruits LYMPHATICS: No lymphadenopathy CARDIAC: RRR, no murmurs, rubs, gallops RESPIRATORY:  Clear to auscultation without rales, wheezing or rhonchi  ABDOMEN: Soft, non-tender, non-distended MUSCULOSKELETAL:  No edema; No deformity   SKIN: Warm and dry NEUROLOGIC:  Alert and oriented x 3 PSYCHIATRIC:  Normal affect   Assessment & Plan Shortness of breath Chronic, multifactorial.  Appears clinically stable.  No changes reported over the past year. Chronic heart failure with preserved ejection fraction (HCC) Last echo from 2023 reviewed.  Patient with grade 2 diastolic dysfunction.  She is managed with losartan and furosemide.  Overall doing well.  Continue current management. Aortic atherosclerosis (HCC) Treated with a statin drug.  On clopidogrel.  She has been managed with clopidogrel ever since she had a TIA.            Medication Adjustments/Labs and Tests Ordered: Current medicines are reviewed at length with the patient today.  Concerns regarding medicines are outlined above.  No orders of the defined types were placed in this encounter.  No orders of the defined types were placed in this encounter.   Patient Instructions  Follow-Up: At [facility], you and your health needs are our priority.  As part of our continuing mission to provide you with exceptional heart care, we have created designated Provider Care Teams.  These Care Teams include your primary Cardiologist (physician) and Advanced Practice Providers (APPs -  Physician Assistants and Nurse Practitioners) who all work together to provide you with the care you need, when you need it.  Your next appointment:   1 year(s)  Provider:   Tonny Bollman, MD        Signed, Tonny Bollman, MD  11/25/2022 1:05 PM    [facility]

## 2022-11-25 NOTE — Patient Instructions (Signed)
Follow-Up: At [facility], you and your health needs are our priority.  As part of our continuing mission to provide you with exceptional heart care, we have created designated Provider Care Teams.  These Care Teams include your primary Cardiologist (physician) and Advanced Practice Providers (APPs -  Physician Assistants and Nurse Practitioners) who all work together to provide you with the care you need, when you need it.  Your next appointment:   1 year(s)  Provider:   Tonny Bollman, MD

## 2022-11-25 NOTE — Assessment & Plan Note (Signed)
Last echo from 2023 reviewed.  Patient with grade 2 diastolic dysfunction.  She is managed with losartan and furosemide.  Overall doing well.  Continue current management.

## 2022-12-13 ENCOUNTER — Other Ambulatory Visit: Payer: Self-pay | Admitting: Family Medicine

## 2022-12-13 DIAGNOSIS — F4323 Adjustment disorder with mixed anxiety and depressed mood: Secondary | ICD-10-CM

## 2022-12-23 ENCOUNTER — Other Ambulatory Visit: Payer: Self-pay

## 2022-12-23 ENCOUNTER — Telehealth: Payer: Self-pay | Admitting: Cardiovascular Disease

## 2022-12-23 MED ORDER — FUROSEMIDE 20 MG PO TABS: ORAL_TABLET | ORAL | 3 refills | Status: DC

## 2022-12-23 NOTE — Telephone Encounter (Signed)
 Rx sent to requested Pharmacy.

## 2022-12-23 NOTE — Telephone Encounter (Signed)
*  STAT* If patient is at the pharmacy, call can be transferred to refill team.   1. Which medications need to be refilled? (please list name of each medication and dose if known) furosemide (LASIX) 20 MG tablet   2. Which pharmacy/location (including street and city if local pharmacy) is medication to be sent to?  [id number] - [place], [place] - [address]      3. Do they need a 30 day or 90 day supply? 90 day    Pt is out of medication

## 2022-12-30 DIAGNOSIS — Z23 Encounter for immunization: Secondary | ICD-10-CM | POA: Diagnosis not present

## 2023-02-17 ENCOUNTER — Other Ambulatory Visit: Payer: Self-pay | Admitting: Gastroenterology

## 2023-02-21 ENCOUNTER — Telehealth: Payer: Self-pay

## 2023-02-21 NOTE — Telephone Encounter (Signed)
LVM for patient to call back. Patient needs to get refill from PCP. We can't prescribe/refill this meditation

## 2023-02-21 NOTE — Telephone Encounter (Signed)
Per Dr Tomasa Rand on 02-20-23 She is on Plavix apparently due to a history of TIA.  She should direct this refill request to her PCP.   LVM for patient to call back. Denied request form pharmacy

## 2023-02-27 ENCOUNTER — Ambulatory Visit: Payer: Self-pay | Admitting: Family Medicine

## 2023-02-27 NOTE — Telephone Encounter (Signed)
 Will see her then  Agree with Er precautions

## 2023-02-27 NOTE — Telephone Encounter (Signed)
 Chief Complaint: coughing, wheezing, chest congestion Symptoms: coughing, wheezing, chest congestion Frequency: since Thursday Pertinent Negatives: Patient denies fever Disposition: [] ED /[] Urgent Care (no appt availability in office) / [x] Appointment(In office/virtual)/ []  [facility]/ [] Home Care/ [] Refused Recommended Disposition /[] [facility]/ []  Follow-up with PCP Additional Notes: Patient's daughter called in, and conferenced in patient, to report symptoms of feeling unwell since Thursday. Patient was in hospital visiting family member last week and started feeling run down with severe cough/wheezing, chest congestion, viral symptoms. Patient denies running a fever. Patient is on home oxygen  for cardiac condition. Patient to be evaluated in office tomorrow with caregiver bringing her to appt. Advised patient to wear a mask to the appointment and call in if symptoms worsen.    Copied from CRM [id number]. Topic: Clinical - Red Word Triage >> Feb 27, 2023  2:24 PM Jayson Michael wrote: Red Word that prompted transfer to Nurse Triage: suspected pneumonia severe productive cough, wheezing/ rattling breath sounds, congestion, sore throat, no fever. Symptoms presented last Wednesday, patient's daughter sheree is wanting to schedule a virtual visit due to her mother being too weak to leave her home. Reason for Disposition  [1] Known COPD or other severe lung disease (i.e., bronchiectasis, cystic fibrosis, lung surgery) AND [2] worsening symptoms (i.e., increased sputum purulence or amount, increased breathing difficulty    Patient on home oxygen  due to cardiac issues  Answer Assessment - Initial Assessment Questions 1. ONSET: "When did the cough begin?"      Wednesday 2. SEVERITY: "How bad is the cough today?"      Moderately  3. SPUTUM: "Describe the color of your sputum" (none, dry cough; clear, white, yellow, green)     No specific color 4. HEMOPTYSIS: "Are you coughing up any  blood?" If so ask: "How much?" (flecks, streaks, tablespoons, etc.)     No 5. DIFFICULTY BREATHING: "Are you having difficulty breathing?" If Yes, ask: "How bad is it?" (e.g., mild, moderate, severe)    - MILD: No SOB at rest, mild SOB with walking, speaks normally in sentences, can lie down, no retractions, pulse < 100.    - MODERATE: SOB at rest, SOB with minimal exertion and prefers to sit, cannot lie down flat, speaks in phrases, mild retractions, audible wheezing, pulse 100-120.    - SEVERE: Very SOB at rest, speaks in single words, struggling to breathe, sitting hunched forward, retractions, pulse > 120      Only when I get up and down 6. FEVER: "Do you have a fever?" If Yes, ask: "What is your temperature, how was it measured, and when did it start?"     No 7. CARDIAC HISTORY: "Do you have any history of heart disease?" (e.g., heart attack, congestive heart failure)      Weak heart muscles 8. LUNG HISTORY: "Do you have any history of lung disease?"  (e.g., pulmonary embolus, asthma, emphysema)     No 9. PE RISK FACTORS: "Do you have a history of blood clots?" (or: recent major surgery, recent prolonged travel, bedridden)     No 10. OTHER SYMPTOMS: "Do you have any other symptoms?" (e.g., runny nose, wheezing, chest pain)       Wheezing, sore throat from drainage,  Protocols used: Cough - Acute Productive-A-AH

## 2023-02-28 ENCOUNTER — Ambulatory Visit (INDEPENDENT_AMBULATORY_CARE_PROVIDER_SITE_OTHER): Payer: Medicare Other | Admitting: Family Medicine

## 2023-02-28 ENCOUNTER — Ambulatory Visit: Payer: Self-pay | Admitting: Family Medicine

## 2023-02-28 ENCOUNTER — Ambulatory Visit: Payer: Medicare Other | Admitting: Family Medicine

## 2023-02-28 ENCOUNTER — Encounter: Payer: Self-pay | Admitting: Family Medicine

## 2023-02-28 ENCOUNTER — Ambulatory Visit (INDEPENDENT_AMBULATORY_CARE_PROVIDER_SITE_OTHER)
Admission: RE | Admit: 2023-02-28 | Discharge: 2023-02-28 | Disposition: A | Discharge status: Home / Self Care | Payer: Medicare Other | Source: Ambulatory Visit | Attending: Family Medicine | Admitting: Family Medicine

## 2023-02-28 VITALS — BP 110/62 | HR 78 | Temp 99.0°F | Ht 63.5 in | Wt 189.4 lb

## 2023-02-28 DIAGNOSIS — J069 Acute upper respiratory infection, unspecified: Secondary | ICD-10-CM | POA: Diagnosis not present

## 2023-02-28 DIAGNOSIS — J209 Acute bronchitis, unspecified: Secondary | ICD-10-CM | POA: Insufficient documentation

## 2023-02-28 DIAGNOSIS — J42 Unspecified chronic bronchitis: Secondary | ICD-10-CM | POA: Diagnosis not present

## 2023-02-28 DIAGNOSIS — R058 Other specified cough: Secondary | ICD-10-CM | POA: Diagnosis not present

## 2023-02-28 DIAGNOSIS — R051 Acute cough: Secondary | ICD-10-CM | POA: Diagnosis not present

## 2023-02-28 DIAGNOSIS — J029 Acute pharyngitis, unspecified: Secondary | ICD-10-CM

## 2023-02-28 DIAGNOSIS — R0602 Shortness of breath: Secondary | ICD-10-CM | POA: Diagnosis not present

## 2023-02-28 DIAGNOSIS — I5032 Chronic diastolic (congestive) heart failure: Secondary | ICD-10-CM

## 2023-02-28 LAB — POCT INFLUENZA A/B
Influenza A, POC: NEGATIVE
Influenza B, POC: NEGATIVE

## 2023-02-28 LAB — POCT RAPID STREP A (OFFICE): Rapid Strep A Screen: NEGATIVE

## 2023-02-28 MED ORDER — DOXYCYCLINE HYCLATE 100 MG PO TABS: 100.0000 mg | ORAL_TABLET | Freq: Two times a day (BID) | ORAL | 0 refills | Status: DC

## 2023-02-28 MED ORDER — PREDNISONE 10 MG PO TABS: ORAL_TABLET | ORAL | 0 refills | Status: DC

## 2023-02-28 NOTE — Assessment & Plan Note (Addendum)
Over a week  Had fam member in ER and was exposed to a lot  Neg covid test at home Neg strep and flu tests  Suspect viral bronchitis   Green mucous  Some rhonchi but no rales or wheezes  Upper airway sounds on exam / few rhonchi but no rales  Cxr : no infiltrate noted fortunately   For bronchitis - prednisone 30 mg taper  Also doxycycline in light of age and risk factors   Update if not starting to improve in a week or if worsening   Call back and Er precautions noted in detail today

## 2023-02-28 NOTE — Patient Instructions (Addendum)
Drink lots of fluids  Drink fluids and rest  mucinex DM is good for cough and congestion  Vaporizer is helpful or some steam from the shower  Nasal saline for congestion as needed  Tylenol for fever or pain or headache  Please alert [place] if symptoms worsen (if severe or short of breath at rest  please go to the ER)   Chest xray now  After result I will send medicines to the pharmacy

## 2023-02-28 NOTE — Assessment & Plan Note (Signed)
Do not suspect this is adding to respiratory symptoms  No edema on cxr

## 2023-02-28 NOTE — Telephone Encounter (Signed)
Copied from CRM [id number]. Topic: Clinical - Prescription Issue >> Feb 28, 2023  5:40 PM Tiffany H wrote: Reason for CRM: Patient called to advise that Prednisone was not received by [facility]. Please resend. Reason for Disposition  [1] Follow-up call from patient regarding patient's clinical status AND [2] information NON-URGENT  Answer Assessment - Initial Assessment Questions 1. REASON FOR CALL or QUESTION: "What is your reason for calling today?" or "How can I best help you?" or "What question do you have that I can help answer?"     Patient called to advise that Prednisone was not received by [facility]. Please resend. 2. CALLER: Document the source of call. (e.g., laboratory, patient).     Patient  Protocols used: PCP Call - No Triage-A-AH

## 2023-02-28 NOTE — Progress Notes (Signed)
Subjective:    Patient ID: [name] [name], female    DOB: 1934-12-24, 89 y.o.   MRN: [id number]  HPI  Wt Readings from Last 3 Encounters:  02/28/23 189 lb 6 oz (85.9 kg)  11/25/22 194 lb 3.2 oz (88.1 kg)  08/15/22 195 lb (88.5 kg)   33.02 kg/m  Vitals:   02/28/23 1234  BP: 110/62  Pulse: 78  Temp: 99 F (37.2 C)  SpO2: 95%     Pt presents for cough /susp of pna   Symptoms for over a week  Nasal congestion  Chest congestion  Green discharge   A little fever since Friday / highest 99.7   Slept for 4 days   Very sore throat  No ear pain  Headache from hard cough   Did covid test at home -neg    Over the counter  Mucinex dm  Caregiver giving her lots of fluids   Results for orders placed or performed in visit on 02/28/23  Rapid Strep A   Collection Time: 02/28/23  1:00 PM  Result Value Ref Range   Rapid Strep A Screen Negative Negative  POCT Influenza A/B   Collection Time: 02/28/23  1:02 PM  Result Value Ref Range   Influenza A, POC Negative Negative   Influenza B, POC Negative Negative    CXR today  DG Chest 2 View Result Date: 02/28/2023 CLINICAL DATA:  Productive cough.  Shortness of breath. EXAM: CHEST - 2 VIEW COMPARISON:  Chest radiograph dated 05/05/2022. FINDINGS: There is mild chronic bronchitic changes. No focal consolidation, pleural effusion, pneumothorax. The cardiac silhouette is within normal limits. Degenerative changes of the spine. No acute osseous pathology. IMPRESSION: No active cardiopulmonary disease. Electronically Signed   By: Elgie Collard M.D.   On: 02/28/2023 15:11      Patient Active Problem List   Diagnosis Date Noted   Acute bronchitis 02/28/2023   Arthritis of both hands 08/15/2022   Cerumen impaction 06/24/2022   Right shoulder pain 12/28/2021   Esophageal dysphagia 12/06/2021   Current use of proton pump inhibitor 05/12/2021   Precordial chest pain 05/07/2021   Aortic atherosclerosis (HCC) 05/07/2021    Coronary artery calcification seen on CT scan 05/07/2021   Myalgia 12/09/2019   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 12/09/2019   Chronic tension-type headache, not intractable 09/06/2019   Ageusia 09/06/2019   Anosmia 09/06/2019   Trigeminal neuralgia 03/15/2019   Mobility impaired 12/11/2018   Poor balance 08/13/2018   Memory loss 06/27/2018   Physical deconditioning 03/19/2018   Chronic heart failure with preserved ejection fraction (HCC) 02/09/2018   Prediabetes 01/26/2017   Hearing loss 01/19/2016   Carpal tunnel syndrome 08/31/2015   Encounter for Medicare annual wellness exam 06/04/2014   Obesity 06/04/2014   Estrogen deficiency 06/04/2014   Schatzki's ring 03/27/2014   OA (osteoarthritis) of knee 08/16/2011   Hemorrhoids 08/10/2011   Other dyspnea and respiratory abnormality 06/21/2010   Mixed incontinence 05/27/2010   OVERACTIVE BLADDER 03/12/2010   Lumbar degenerative disc disease 04/13/2009   Chronic GERD 10/15/2008   History of cardiovascular disorder 10/15/2008   Arthropathy, multiple sites 06/26/2008   External hemorrhoids 02/01/2008   Diaphragmatic hernia 02/01/2008   Diverticulosis of colon 02/01/2008   GASTRITIS, HX OF 02/01/2008   DYSPNEA 09/19/2007   Vitamin D deficiency 07/03/2007   Osteoporosis of lumbar spine 06/13/2007   Fatigue 06/13/2007   History of Helicobacter pylori infection 06/06/2006   Hyperlipidemia 06/06/2006   Generalized anxiety disorder 06/06/2006  Adjustment disorder with mixed anxiety and depressed mood 06/06/2006   Macular degeneration (senile) of retina 06/06/2006   H/O: CVA (cerebrovascular accident) 06/06/2006   Osteoarthritis 06/06/2006   Past Medical History:  Diagnosis Date   Alternating constipation and diarrhea    Anxiety state, unspecified    CHF (congestive heart failure) (HCC)    Complication of anesthesia    slow to wake up with surgery several years ago    COVID-19 virus infection 12/2018    Depressive disorder, not elsewhere classified    Diaphragmatic hernia without mention of obstruction or gangrene    Difficulty sleeping    Diverticulosis of colon (without mention of hemorrhage)    Esophageal reflux    External hemorrhoids without mention of complication    Family history of adverse reaction to anesthesia    Dad very slow to wake upi   Fatigue    Frequency of urination    Gallstones    Headache    Helicobacter pylori (H. pylori)    Hiatal hernia    History of nuclear stress test    Myoview 05/2021: EF 70, normal perfusion; low risk   History of transfusion    Lumbar spondylosis    Macular degeneration    Macular degeneration (senile) of retina, unspecified    Osteoarthrosis, unspecified whether generalized or localized, unspecified site 08/11/2011   hx. "rhematoid arthritis", osteoarthritis, DDD, bursitis(hip)   Osteoporosis    Palpitations    PONV (postoperative nausea and vomiting)    Schatzki's ring    Shortness of breath 08/11/2011   with exertion only at present   Unspecified cerebral artery occlusion with cerebral infarction 08/11/2011   '97/ '06( TIA)-affected lt. side, slight weakness L side   Unspecified vitamin D deficiency    Past Surgical History:  Procedure Laterality Date   ABDOMINAL HYSTERECTOMY  08-11-11   APPENDECTOMY     BACK SURGERY  08-11-11   '11-hx. lumbar fusion with retained hardware   CARPAL TUNNEL RELEASE Right 05/02/2022   Procedure: RIGHT CARPAL TUNNEL RELEASE;  Surgeon: Betha Loa, MD;  Location: [facility];  Service: Orthopedics;  Laterality: Right;  Bier block   CATARACT EXTRACTION  08-11-11   Bilateral   ESOPHAGEAL DILATION     ESOPHAGOGASTRODUODENOSCOPY (EGD) WITH PROPOFOL N/A 12/06/2021   Procedure: ESOPHAGOGASTRODUODENOSCOPY (EGD) WITH PROPOFOL;  Surgeon: Jenel Lucks, MD;  Location: [facility];  Service: Gastroenterology;  Laterality: N/A;   KNEE SURGERY  08-11-11   rt. knee scope   PATELLAR TENDON  REPAIR Left 02/22/2014   Procedure: PATELLA TENDON REPAIR;  Surgeon: Loanne Drilling, MD;  Location: [facility];  Service: Orthopedics;  Laterality: Left;   PATELLAR TENDON REPAIR Left 06/25/2014   Procedure: LEFT PATELLA TENDON REPAIR;  Surgeon: Ollen Gross, MD;  Location: [facility];  Service: Orthopedics;  Laterality: Left;   SAVORY DILATION N/A 12/06/2021   Procedure: SAVORY DILATION;  Surgeon: Jenel Lucks, MD;  Location: [facility];  Service: Gastroenterology;  Laterality: N/A;   TOTAL KNEE ARTHROPLASTY  08/16/2011   Procedure: TOTAL KNEE ARTHROPLASTY;  Surgeon: Loanne Drilling, MD;  Location: [facility];  Service: Orthopedics;  Laterality: Right;   TOTAL KNEE ARTHROPLASTY Left 01/27/2014   Procedure: LEFT TOTAL KNEE ARTHROPLASTY;  Surgeon: Loanne Drilling, MD;  Location: [facility];  Service: Orthopedics;  Laterality: Left;   TRIGGER FINGER RELEASE Right 05/02/2022   Procedure: RELEASE TRIGGER FINGER/A-1 PULLEY RIGHT LONG FINGER;  Surgeon: Betha Loa, MD;  Location: [facility];  Service: Orthopedics;  Laterality: Right;  Bier block   TUBAL LIGATION     Social History   Tobacco Use   Smoking status: Never   Smokeless tobacco: Never  Vaping Use   Vaping status: Never Used  Substance Use Topics   Alcohol use: No    Alcohol/week: 0.0 standard drinks of alcohol   Drug use: No   Family History  Problem Relation Age of Onset   Heart disease Father    Coronary artery disease Brother    Diabetes Brother    Heart disease Brother    Pancreatic cancer Daughter    Diabetes Sister    Colon cancer Neg Hx    Colon polyps Neg Hx    Esophageal cancer Neg Hx    Gallbladder disease Neg Hx    Allergies  Allergen Reactions   Alendronate Sodium Other (See Comments)    REACTION: JAW PAIN   Celecoxib Other (See Comments)    REACTION: GI UPSET    Chocolate Diarrhea   Omeprazole Nausea Only   Rabeprazole Nausea And Vomiting   Latex Itching and Rash   Sulfa Antibiotics Rash    Current Outpatient Medications on File Prior to Visit  Medication Sig Dispense Refill   APPLE CIDER VINEGAR PO Take 2 tablets by mouth at bedtime.     atorvastatin (LIPITOR) 10 MG tablet TAKE 1 TABLET DAILY 90 tablet 2   buPROPion (WELLBUTRIN XL) 150 MG 24 hr tablet TAKE 2 TABLETS DAILY 180 tablet 1   calcium carbonate (TUMS - DOSED IN MG ELEMENTAL CALCIUM) 500 MG chewable tablet Chew 1 tablet (200 mg of elemental calcium total) by mouth 4 (four) times daily as needed for indigestion or heartburn. 10 tablet 0   clopidogrel (PLAVIX) 75 MG tablet Take 1 tablet (75 mg total) by mouth daily. 90 tablet 3   diclofenac sodium (VOLTAREN) 1 % GEL Apply 1 application  topically at bedtime.     estradiol (ESTRACE) 0.1 MG/GM vaginal cream Use a pea sized amount of cream vaginally twice per week 42.5 g 1   famotidine (PEPCID) 40 MG tablet TAKE 1 TABLET DAILY 90 tablet 2   furosemide (LASIX) 20 MG tablet TAKE 1 TABLET DAILY 90 tablet 3   gabapentin (NEURONTIN) 100 MG capsule TAKE 2 CAPSULES THREE TIMES A DAY (Patient taking differently: 100 mg at bedtime.) 540 capsule 0   HYDROcodone-acetaminophen (NORCO/VICODIN) 5-325 MG tablet 1-2 tabs PO q6 hours prn pain 15 tablet 0   Liniments (ABSORBINE ARTHRITIS STRENGTH EX) Apply 1 application  topically daily as needed (Back pain).     loperamide (IMODIUM) 2 MG capsule Take 2 mg by mouth as needed for diarrhea or loose stools.     metoprolol succinate (TOPROL-XL) 25 MG 24 hr tablet TAKE ONE-HALF (1/2) TABLET AT BEDTIME 45 tablet 3   Multiple Vitamins-Minerals (PRESERVISION AREDS PO) Take 1 capsule by mouth 2 (two) times daily.     pantoprazole (PROTONIX) 40 MG tablet Take 1 tablet (40 mg total) by mouth 2 (two) times daily. 180 tablet 3   Polyethyl Glycol-Propyl Glycol (SYSTANE OP) Apply 1-2 drops to eye 3 (three) times daily as needed (dry eyes.).      sertraline (ZOLOFT) 100 MG tablet TAKE 2 TABLETS DAILY 180 tablet 2   nitroGLYCERIN (NITROSTAT) 0.4 MG SL tablet  Place 1 tablet (0.4 mg total) under the tongue every 5 (five) minutes as needed for chest pain. 25 tablet 3   No current facility-administered medications on file prior to visit.  Review of Systems  Constitutional:  Positive for fatigue. Negative for activity change, appetite change, fever and unexpected weight change.       Low grade temp  HENT:  Positive for congestion, postnasal drip and sneezing. Negative for ear pain, facial swelling, rhinorrhea, sinus pressure and sore throat.   Eyes:  Negative for pain, discharge, redness and visual disturbance.  Respiratory:  Positive for cough and shortness of breath. Negative for chest tightness, wheezing and stridor.   Cardiovascular:  Negative for chest pain and palpitations.  Gastrointestinal:  Negative for abdominal pain, blood in stool, constipation, diarrhea, nausea and vomiting.  Endocrine: Negative for polydipsia and polyuria.  Genitourinary:  Negative for dysuria, frequency, hematuria and urgency.  Musculoskeletal:  Negative for arthralgias, back pain and myalgias.  Skin:  Negative for pallor and rash.  Allergic/Immunologic: Negative for environmental allergies.  Neurological:  Positive for headaches. Negative for dizziness, syncope, weakness and light-headedness.  Hematological:  Negative for adenopathy. Does not bruise/bleed easily.  Psychiatric/Behavioral:  Negative for confusion, decreased concentration and dysphoric mood. The patient is not nervous/anxious.        Objective:   Physical Exam Constitutional:      General: She is not in acute distress.    Appearance: Normal appearance. She is well-developed. She is obese. She is ill-appearing. She is not toxic-appearing or diaphoretic.     Comments: Pt appears ill but also mentally sharp and talkative   HENT:     Head: Normocephalic and atraumatic.     Comments: Nares are injected and congested      Right Ear: Tympanic membrane, ear canal and external ear normal.     Left Ear:  Tympanic membrane, ear canal and external ear normal.     Nose: Congestion and rhinorrhea present.     Mouth/Throat:     Mouth: Mucous membranes are moist.     Pharynx: Oropharynx is clear. Posterior oropharyngeal erythema present. No oropharyngeal exudate.     Comments: Clear pnd Mild posterior injection   Eyes:     General:        Right eye: No discharge.        Left eye: No discharge.     Conjunctiva/sclera: Conjunctivae normal.     Pupils: Pupils are equal, round, and reactive to light.  Cardiovascular:     Rate and Rhythm: Normal rate.     Heart sounds: Normal heart sounds.  Pulmonary:     Effort: Pulmonary effort is normal. No respiratory distress.     Breath sounds: Normal breath sounds. No stridor. No wheezing, rhonchi or rales.     Comments: Scattered rhonchi  Upper airway sounds improved with cough  Junky sounding cough   No wheeze No rales  Chest:     Chest wall: No tenderness.  Musculoskeletal:     Cervical back: Normal range of motion and neck supple.  Lymphadenopathy:     Cervical: No cervical adenopathy.  Skin:    General: Skin is warm and dry.     Capillary Refill: Capillary refill takes less than 2 seconds.     Findings: No rash.  Neurological:     Mental Status: She is alert.     Cranial Nerves: No cranial nerve deficit.  Psychiatric:        Mood and Affect: Mood normal.           Assessment & Plan:   Problem List Items Addressed This Visit       Cardiovascular and Mediastinum  Chronic heart failure with preserved ejection fraction (HCC)   Do not suspect this is adding to respiratory symptoms  No edema on cxr         Respiratory   Acute bronchitis - Primary   Over a week  Had fam member in ER and was exposed to a lot  Neg covid test at home Neg strep and flu tests  Suspect viral bronchitis   Green mucous  Some rhonchi but no rales or wheezes  Upper airway sounds on exam / few rhonchi but no rales  Cxr : no infiltrate noted  fortunately   For bronchitis - prednisone 30 mg taper  Also doxycycline in light of age and risk factors   Update if not starting to improve in a week or if worsening   Call back and Er precautions noted in detail today           Other Visit Diagnoses       Sore throat       Relevant Orders   POCT Influenza A/B (Completed)   Rapid Strep A (Completed)     Acute cough       Relevant Orders   POCT Influenza A/B (Completed)

## 2023-03-01 NOTE — Telephone Encounter (Signed)
Daughter said the after hrs nurse called it in for pt and she picked up med this morning for her

## 2023-03-02 ENCOUNTER — Encounter: Payer: Self-pay | Admitting: Family Medicine

## 2023-03-02 NOTE — Telephone Encounter (Signed)
Patient daughter [name] [name] received the message and said she will take care of it

## 2023-03-02 NOTE — Telephone Encounter (Signed)
Routing to cardiology

## 2023-03-03 ENCOUNTER — Other Ambulatory Visit: Payer: Self-pay | Admitting: Cardiovascular Disease

## 2023-03-03 MED ORDER — CLOPIDOGREL BISULFATE 75 MG PO TABS: 75.0000 mg | ORAL_TABLET | Freq: Every day | ORAL | 3 refills | Status: DC

## 2023-03-03 NOTE — Telephone Encounter (Signed)
Per OV note by Excell Seltzer on 11/25/22:  Aortic atherosclerosis (HCC) Treated with a statin drug.  On clopidogrel.  She has been managed with clopidogrel ever since she had a TIA.  Will send refill for Plavix into pharmacy at this time.

## 2023-03-03 NOTE — Telephone Encounter (Signed)
Pt is requesting a refill on clopidogrel. Dr. Excell Seltzer has never refill this medication or prescribed it. Would Dr. Excell Seltzer like to refill this medication? Please address

## 2023-03-03 NOTE — Telephone Encounter (Signed)
*  STAT* If patient is at the pharmacy, call can be transferred to refill team.   1. Which medications need to be refilled? (please list name of each medication and dose if known)  clopidogrel (PLAVIX) 75 MG tablet  2. Which pharmacy/location (including street and city if local pharmacy) is medication to be sent to? [id number] - [place], [place] - [address]  3. Do they need a 30 day or 90 day supply?  90 day supply

## 2023-03-06 MED ORDER — CLOPIDOGREL BISULFATE 75 MG PO TABS: 75.0000 mg | ORAL_TABLET | Freq: Every day | ORAL | 3 refills | Status: DC

## 2023-03-06 NOTE — Telephone Encounter (Signed)
 I'm not sure if she means clopidogrel 75 mg daily or furosemide 20 mg daily. We can fill either/both of these for her. Thank you

## 2023-03-09 ENCOUNTER — Other Ambulatory Visit: Payer: Self-pay | Admitting: Family Medicine

## 2023-03-10 DIAGNOSIS — H5310 Unspecified subjective visual disturbances: Secondary | ICD-10-CM | POA: Diagnosis not present

## 2023-03-10 DIAGNOSIS — H353131 Nonexudative age-related macular degeneration, bilateral, early dry stage: Secondary | ICD-10-CM | POA: Diagnosis not present

## 2023-03-10 DIAGNOSIS — H16102 Unspecified superficial keratitis, left eye: Secondary | ICD-10-CM | POA: Diagnosis not present

## 2023-03-15 DIAGNOSIS — G5602 Carpal tunnel syndrome, left upper limb: Secondary | ICD-10-CM | POA: Diagnosis not present

## 2023-03-17 DIAGNOSIS — H04123 Dry eye syndrome of bilateral lacrimal glands: Secondary | ICD-10-CM | POA: Diagnosis not present

## 2023-03-27 ENCOUNTER — Other Ambulatory Visit: Payer: Self-pay | Admitting: Family Medicine

## 2023-03-28 NOTE — Telephone Encounter (Signed)
 Last filled on 12/30/21 # 540 caps/ 0 refill  Last OV was for an acute sickness on 02/28/23

## 2023-04-14 ENCOUNTER — Other Ambulatory Visit: Payer: Self-pay | Admitting: Family Medicine

## 2023-04-20 ENCOUNTER — Other Ambulatory Visit: Payer: Self-pay | Admitting: Family Medicine

## 2023-05-07 ENCOUNTER — Inpatient Hospital Stay (HOSPITAL_COMMUNITY)
Admission: EM | Admit: 2023-05-07 | Discharge: 2023-05-13 | DRG: 481 | Disposition: A | Discharge status: Skilled Nursing Facility | Attending: Internal Medicine | Admitting: Internal Medicine

## 2023-05-07 ENCOUNTER — Emergency Department (HOSPITAL_COMMUNITY)

## 2023-05-07 ENCOUNTER — Encounter (HOSPITAL_COMMUNITY): Payer: Self-pay | Admitting: Internal Medicine

## 2023-05-07 ENCOUNTER — Other Ambulatory Visit: Payer: Self-pay

## 2023-05-07 DIAGNOSIS — Z8249 Family history of ischemic heart disease and other diseases of the circulatory system: Secondary | ICD-10-CM

## 2023-05-07 DIAGNOSIS — S72102A Unspecified trochanteric fracture of left femur, initial encounter for closed fracture: Secondary | ICD-10-CM | POA: Diagnosis not present

## 2023-05-07 DIAGNOSIS — G8929 Other chronic pain: Secondary | ICD-10-CM | POA: Diagnosis present

## 2023-05-07 DIAGNOSIS — F411 Generalized anxiety disorder: Secondary | ICD-10-CM | POA: Diagnosis present

## 2023-05-07 DIAGNOSIS — Z90710 Acquired absence of both cervix and uterus: Secondary | ICD-10-CM

## 2023-05-07 DIAGNOSIS — Z7902 Long term (current) use of antithrombotics/antiplatelets: Secondary | ICD-10-CM

## 2023-05-07 DIAGNOSIS — M80052D Age-related osteoporosis with current pathological fracture, left femur, subsequent encounter for fracture with routine healing: Secondary | ICD-10-CM | POA: Diagnosis not present

## 2023-05-07 DIAGNOSIS — S72142A Displaced intertrochanteric fracture of left femur, initial encounter for closed fracture: Secondary | ICD-10-CM | POA: Diagnosis not present

## 2023-05-07 DIAGNOSIS — F32A Depression, unspecified: Secondary | ICD-10-CM | POA: Diagnosis present

## 2023-05-07 DIAGNOSIS — K219 Gastro-esophageal reflux disease without esophagitis: Secondary | ICD-10-CM | POA: Diagnosis present

## 2023-05-07 DIAGNOSIS — I509 Heart failure, unspecified: Secondary | ICD-10-CM | POA: Diagnosis not present

## 2023-05-07 DIAGNOSIS — I2510 Atherosclerotic heart disease of native coronary artery without angina pectoris: Secondary | ICD-10-CM | POA: Diagnosis not present

## 2023-05-07 DIAGNOSIS — D62 Acute posthemorrhagic anemia: Secondary | ICD-10-CM | POA: Diagnosis not present

## 2023-05-07 DIAGNOSIS — S72009A Fracture of unspecified part of neck of unspecified femur, initial encounter for closed fracture: Secondary | ICD-10-CM | POA: Diagnosis present

## 2023-05-07 DIAGNOSIS — M80052A Age-related osteoporosis with current pathological fracture, left femur, initial encounter for fracture: Principal | ICD-10-CM | POA: Diagnosis present

## 2023-05-07 DIAGNOSIS — M25562 Pain in left knee: Secondary | ICD-10-CM | POA: Diagnosis not present

## 2023-05-07 DIAGNOSIS — W08XXXA Fall from other furniture, initial encounter: Secondary | ICD-10-CM | POA: Diagnosis present

## 2023-05-07 DIAGNOSIS — E876 Hypokalemia: Secondary | ICD-10-CM | POA: Diagnosis present

## 2023-05-07 DIAGNOSIS — Z8616 Personal history of COVID-19: Secondary | ICD-10-CM | POA: Diagnosis not present

## 2023-05-07 DIAGNOSIS — Z743 Need for continuous supervision: Secondary | ICD-10-CM | POA: Diagnosis not present

## 2023-05-07 DIAGNOSIS — G8918 Other acute postprocedural pain: Secondary | ICD-10-CM | POA: Diagnosis not present

## 2023-05-07 DIAGNOSIS — Z79899 Other long term (current) drug therapy: Secondary | ICD-10-CM

## 2023-05-07 DIAGNOSIS — Z96652 Presence of left artificial knee joint: Secondary | ICD-10-CM | POA: Diagnosis not present

## 2023-05-07 DIAGNOSIS — R2681 Unsteadiness on feet: Secondary | ICD-10-CM | POA: Diagnosis not present

## 2023-05-07 DIAGNOSIS — E785 Hyperlipidemia, unspecified: Secondary | ICD-10-CM | POA: Diagnosis present

## 2023-05-07 DIAGNOSIS — R5383 Other fatigue: Secondary | ICD-10-CM | POA: Diagnosis not present

## 2023-05-07 DIAGNOSIS — I25119 Atherosclerotic heart disease of native coronary artery with unspecified angina pectoris: Secondary | ICD-10-CM | POA: Diagnosis not present

## 2023-05-07 DIAGNOSIS — I5032 Chronic diastolic (congestive) heart failure: Secondary | ICD-10-CM | POA: Diagnosis present

## 2023-05-07 DIAGNOSIS — H3530 Unspecified macular degeneration: Secondary | ICD-10-CM | POA: Diagnosis not present

## 2023-05-07 DIAGNOSIS — W19XXXA Unspecified fall, initial encounter: Secondary | ICD-10-CM | POA: Diagnosis not present

## 2023-05-07 DIAGNOSIS — M160 Bilateral primary osteoarthritis of hip: Secondary | ICD-10-CM | POA: Diagnosis not present

## 2023-05-07 DIAGNOSIS — G4700 Insomnia, unspecified: Secondary | ICD-10-CM | POA: Diagnosis present

## 2023-05-07 DIAGNOSIS — M810 Age-related osteoporosis without current pathological fracture: Secondary | ICD-10-CM | POA: Diagnosis not present

## 2023-05-07 DIAGNOSIS — G500 Trigeminal neuralgia: Secondary | ICD-10-CM | POA: Diagnosis not present

## 2023-05-07 DIAGNOSIS — I5031 Acute diastolic (congestive) heart failure: Secondary | ICD-10-CM | POA: Diagnosis not present

## 2023-05-07 DIAGNOSIS — I10 Essential (primary) hypertension: Secondary | ICD-10-CM | POA: Diagnosis not present

## 2023-05-07 DIAGNOSIS — K449 Diaphragmatic hernia without obstruction or gangrene: Secondary | ICD-10-CM | POA: Diagnosis present

## 2023-05-07 DIAGNOSIS — Z9889 Other specified postprocedural states: Secondary | ICD-10-CM | POA: Diagnosis not present

## 2023-05-07 DIAGNOSIS — Z8673 Personal history of transient ischemic attack (TIA), and cerebral infarction without residual deficits: Secondary | ICD-10-CM

## 2023-05-07 DIAGNOSIS — I110 Hypertensive heart disease with heart failure: Secondary | ICD-10-CM | POA: Diagnosis present

## 2023-05-07 DIAGNOSIS — M25559 Pain in unspecified hip: Secondary | ICD-10-CM | POA: Diagnosis not present

## 2023-05-07 DIAGNOSIS — R278 Other lack of coordination: Secondary | ICD-10-CM | POA: Diagnosis not present

## 2023-05-07 DIAGNOSIS — G629 Polyneuropathy, unspecified: Secondary | ICD-10-CM | POA: Diagnosis not present

## 2023-05-07 DIAGNOSIS — S72002A Fracture of unspecified part of neck of left femur, initial encounter for closed fracture: Secondary | ICD-10-CM | POA: Diagnosis not present

## 2023-05-07 DIAGNOSIS — M6281 Muscle weakness (generalized): Secondary | ICD-10-CM | POA: Diagnosis not present

## 2023-05-07 DIAGNOSIS — M79662 Pain in left lower leg: Secondary | ICD-10-CM | POA: Diagnosis not present

## 2023-05-07 DIAGNOSIS — R0902 Hypoxemia: Secondary | ICD-10-CM | POA: Diagnosis not present

## 2023-05-07 DIAGNOSIS — M129 Arthropathy, unspecified: Secondary | ICD-10-CM | POA: Diagnosis not present

## 2023-05-07 DIAGNOSIS — R1314 Dysphagia, pharyngoesophageal phase: Secondary | ICD-10-CM | POA: Diagnosis not present

## 2023-05-07 DIAGNOSIS — M25462 Effusion, left knee: Secondary | ICD-10-CM | POA: Diagnosis not present

## 2023-05-07 DIAGNOSIS — S72132D Displaced apophyseal fracture of left femur, subsequent encounter for closed fracture with routine healing: Secondary | ICD-10-CM | POA: Diagnosis not present

## 2023-05-07 LAB — BASIC METABOLIC PANEL WITH GFR
Anion gap: 9 (ref 5–15)
BUN: 21 mg/dL (ref 8–23)
CO2: 26 mmol/L (ref 22–32)
Calcium: 8.3 mg/dL — ABNORMAL LOW (ref 8.9–10.3)
Chloride: 105 mmol/L (ref 98–111)
Creatinine, Ser: 0.97 mg/dL (ref 0.44–1.00)
GFR, Estimated: 57 mL/min — ABNORMAL LOW (ref >60)
Glucose, Bld: 118 mg/dL — ABNORMAL HIGH (ref 70–99)
Potassium: 3.4 mmol/L — ABNORMAL LOW (ref 3.5–5.1)
Sodium: 140 mmol/L (ref 135–145)

## 2023-05-07 LAB — CBC WITH DIFFERENTIAL/PLATELET
Abs Immature Granulocytes: 0.04 10*3/uL (ref 0.00–0.07)
Basophils Absolute: 0.1 10*3/uL (ref 0.0–0.1)
Basophils Relative: 1 %
Eosinophils Absolute: 0.4 10*3/uL (ref 0.0–0.5)
Eosinophils Relative: 5 %
HCT: 37.8 % (ref 36.0–46.0)
Hemoglobin: 12 g/dL (ref 12.0–15.0)
Immature Granulocytes: 0 %
Lymphocytes Relative: 16 %
Lymphs Abs: 1.5 10*3/uL (ref 0.7–4.0)
MCH: 28.1 pg (ref 26.0–34.0)
MCHC: 31.7 g/dL (ref 30.0–36.0)
MCV: 88.5 fL (ref 80.0–100.0)
Monocytes Absolute: 0.8 10*3/uL (ref 0.1–1.0)
Monocytes Relative: 9 %
Neutro Abs: 6.2 10*3/uL (ref 1.7–7.7)
Neutrophils Relative %: 69 %
Platelets: 257 10*3/uL (ref 150–400)
RBC: 4.27 MIL/uL (ref 3.87–5.11)
RDW: 14.2 % (ref 11.5–15.5)
WBC: 8.9 10*3/uL (ref 4.0–10.5)
nRBC: 0 % (ref 0.0–0.2)

## 2023-05-07 LAB — TYPE AND SCREEN
ABO/RH(D): B POS
Antibody Screen: NEGATIVE

## 2023-05-07 LAB — PROTIME-INR
INR: 1 (ref 0.8–1.2)
Prothrombin Time: 13.5 s (ref 11.4–15.2)

## 2023-05-07 MED ORDER — OXYCODONE HCL 5 MG PO TABS
5.0000 mg | ORAL_TABLET | ORAL | Status: DC | PRN
  Administered 2023-05-07 – 2023-05-09 (×2): 5 mg via ORAL
  Filled 2023-05-07 (×2): qty 1

## 2023-05-07 MED ORDER — HYDROMORPHONE HCL 1 MG/ML IJ SOLN
1.0000 mg | Freq: Once | INTRAMUSCULAR | Status: AC
  Administered 2023-05-07: 1 mg via INTRAVENOUS
  Filled 2023-05-07: qty 1

## 2023-05-07 MED ORDER — ATORVASTATIN CALCIUM 10 MG PO TABS
10.0000 mg | ORAL_TABLET | Freq: Every day | ORAL | Status: DC
  Administered 2023-05-07 – 2023-05-12 (×6): 10 mg via ORAL
  Filled 2023-05-07 (×6): qty 1

## 2023-05-07 MED ORDER — FAMOTIDINE 20 MG PO TABS
40.0000 mg | ORAL_TABLET | Freq: Every day | ORAL | Status: DC
  Administered 2023-05-08 – 2023-05-13 (×5): 40 mg via ORAL
  Filled 2023-05-07 (×6): qty 2

## 2023-05-07 MED ORDER — LORATADINE 10 MG PO TABS
10.0000 mg | ORAL_TABLET | Freq: Every day | ORAL | Status: DC | PRN
  Administered 2023-05-07: 10 mg via ORAL
  Filled 2023-05-07: qty 1

## 2023-05-07 MED ORDER — METOPROLOL SUCCINATE ER 25 MG PO TB24
12.5000 mg | ORAL_TABLET | Freq: Every day | ORAL | Status: DC
  Administered 2023-05-07 – 2023-05-12 (×5): 12.5 mg via ORAL
  Filled 2023-05-07 (×6): qty 1

## 2023-05-07 MED ORDER — SERTRALINE HCL 100 MG PO TABS
100.0000 mg | ORAL_TABLET | Freq: Two times a day (BID) | ORAL | Status: DC
  Administered 2023-05-07 – 2023-05-13 (×11): 100 mg via ORAL
  Filled 2023-05-07 (×11): qty 1

## 2023-05-07 MED ORDER — BUPROPION HCL ER (XL) 300 MG PO TB24
300.0000 mg | ORAL_TABLET | Freq: Every day | ORAL | Status: DC
  Administered 2023-05-08 – 2023-05-13 (×5): 300 mg via ORAL
  Filled 2023-05-07 (×5): qty 1

## 2023-05-07 MED ORDER — FENTANYL CITRATE PF 50 MCG/ML IJ SOSY: 50.0000 ug | PREFILLED_SYRINGE | Freq: Once | INTRAMUSCULAR | Status: DC

## 2023-05-07 MED ORDER — ONDANSETRON HCL 4 MG PO TABS: 4.0000 mg | ORAL_TABLET | Freq: Four times a day (QID) | ORAL | Status: DC | PRN

## 2023-05-07 MED ORDER — ACETAMINOPHEN 650 MG RE SUPP: 650.0000 mg | Freq: Four times a day (QID) | RECTAL | Status: DC | PRN

## 2023-05-07 MED ORDER — POTASSIUM CHLORIDE 20 MEQ PO PACK
40.0000 meq | PACK | Freq: Once | ORAL | Status: AC
  Administered 2023-05-07: 40 meq via ORAL
  Filled 2023-05-07: qty 2

## 2023-05-07 MED ORDER — MORPHINE SULFATE (PF) 4 MG/ML IV SOLN
4.0000 mg | INTRAVENOUS | Status: DC | PRN
  Administered 2023-05-07: 4 mg via INTRAVENOUS
  Filled 2023-05-07: qty 1

## 2023-05-07 MED ORDER — ONDANSETRON HCL 4 MG/2ML IJ SOLN
4.0000 mg | Freq: Four times a day (QID) | INTRAMUSCULAR | Status: DC | PRN
  Administered 2023-05-08 – 2023-05-09 (×4): 4 mg via INTRAVENOUS
  Filled 2023-05-07 (×4): qty 2

## 2023-05-07 MED ORDER — HYDROMORPHONE HCL 1 MG/ML IJ SOLN: 0.5000 mg | Freq: Once | INTRAMUSCULAR | Status: DC

## 2023-05-07 MED ORDER — ACETAMINOPHEN 325 MG PO TABS
650.0000 mg | ORAL_TABLET | Freq: Four times a day (QID) | ORAL | Status: DC | PRN
  Administered 2023-05-07: 650 mg via ORAL
  Filled 2023-05-07: qty 2

## 2023-05-07 MED ORDER — MORPHINE SULFATE (PF) 2 MG/ML IV SOLN
2.0000 mg | INTRAVENOUS | Status: DC | PRN
  Administered 2023-05-07 – 2023-05-09 (×9): 2 mg via INTRAVENOUS
  Filled 2023-05-07 (×9): qty 1

## 2023-05-07 MED ORDER — HYDROMORPHONE HCL 1 MG/ML IJ SOLN
0.5000 mg | Freq: Once | INTRAMUSCULAR | Status: AC
  Administered 2023-05-07: 0.5 mg via INTRAVENOUS
  Filled 2023-05-07: qty 1

## 2023-05-07 MED ORDER — ONDANSETRON HCL 4 MG/2ML IJ SOLN
4.0000 mg | Freq: Once | INTRAMUSCULAR | Status: AC | PRN
  Administered 2023-05-07: 4 mg via INTRAVENOUS
  Filled 2023-05-07: qty 2

## 2023-05-07 MED ORDER — GABAPENTIN 100 MG PO CAPS
200.0000 mg | ORAL_CAPSULE | Freq: Three times a day (TID) | ORAL | Status: DC
  Administered 2023-05-07: 200 mg via ORAL
  Filled 2023-05-07 (×2): qty 2

## 2023-05-07 MED ORDER — FUROSEMIDE 20 MG PO TABS: 20.0000 mg | ORAL_TABLET | Freq: Every day | ORAL | Status: DC

## 2023-05-07 NOTE — Progress Notes (Signed)
 Orthopedic Tech Progress Note Patient Details:  [name] [name] August 01, 1934 [id number]  Patient ID: Angele Barbara, female   DOB: Jan 31, 1934, 89 y.o.   MRN: [id number] Can't place in traction until she gets a hospital bed Herbie Loll 05/07/2023, 6:20 PM

## 2023-05-07 NOTE — H&P (Signed)
 History and Physical    [name] [name] HYQ:[id number] DOB: 11/22/1934 DOA: 05/07/2023  PCP: Clemens Curt, MD   Chief Complaint:  fall  HPI: [name] [name] is a 89 y.o. female with medical history significant of hyperlipidemia, depression, CAD, GERD, chronic pain who presents emergency department after mechanical fall.  Patient was on a stool and fell and landed on her left side.  She denied loss of consciousness or head trauma.  She subsequently developed severe left hip pain radiating into her knee.  EMS was called and she was transported to the ER for further assessment.  On arrival she was afebrile and hemodynamically stable.  Labs were obtained which showed potassium 3.4, creatinine 0.97, WBC 8.9, hemoglobin 12, INR 1.0.  X-ray of tibia and fibula showed intertrochanteric femur fracture.  X-ray of pelvis showed similar findings.  Orthopedics was consulted with plans for possible operative intervention in the morning.  Patient's Plavix  was held and she was admitted for further workup.   Review of Systems: Review of Systems  Constitutional:  Negative for chills, fever and weight loss.  HENT: Negative.    Eyes: Negative.   Cardiovascular: Negative.   Genitourinary: Negative.   Musculoskeletal:  Positive for falls and joint pain.  Skin: Negative.   Neurological: Negative.   Endo/Heme/Allergies: Negative.   Psychiatric/Behavioral: Negative.    All other systems reviewed and are negative.    As per HPI otherwise 10 point review of systems negative.   Allergies  Allergen Reactions   Alendronate Sodium Other (See Comments)    JAW PAIN   Celecoxib Other (See Comments)    GI UPSET   Chocolate Diarrhea   Omeprazole Nausea Only   Rabeprazole Nausea And Vomiting   Latex Itching, Dermatitis and Rash   Sulfa  Antibiotics Rash and Dermatitis    Past Medical History:  Diagnosis Date   Alternating constipation and diarrhea    Anxiety state, unspecified    CHF (congestive heart  failure) (HCC)    Complication of anesthesia    slow to wake up with surgery several years ago    COVID-19 virus infection 12/2018   Depressive disorder, not elsewhere classified    Diaphragmatic hernia without mention of obstruction or gangrene    Difficulty sleeping    Diverticulosis of colon (without mention of hemorrhage)    Esophageal reflux    External hemorrhoids without mention of complication    Family history of adverse reaction to anesthesia    Dad very slow to wake upi   Fatigue    Frequency of urination    Gallstones    Headache    Helicobacter pylori (H. pylori)    Hiatal hernia    History of nuclear stress test    Myoview  05/2021: EF 70, normal perfusion; low risk   History of transfusion    Lumbar spondylosis    Macular degeneration    Macular degeneration (senile) of retina, unspecified    Osteoarthrosis, unspecified whether generalized or localized, unspecified site 08/11/2011   hx. "rhematoid arthritis", osteoarthritis, DDD, bursitis(hip)   Osteoporosis    Palpitations    PONV (postoperative nausea and vomiting)    Schatzki's ring    Shortness of breath 08/11/2011   with exertion only at present   Unspecified cerebral artery occlusion with cerebral infarction 08/11/2011   '97/ '06( TIA)-affected lt. side, slight weakness L side   Unspecified vitamin D  deficiency     Past Surgical History:  Procedure Laterality Date   ABDOMINAL HYSTERECTOMY  08-11-11   APPENDECTOMY     BACK SURGERY  08-11-11   '11-hx. lumbar fusion with retained hardware   CARPAL TUNNEL RELEASE Right 05/02/2022   Procedure: RIGHT CARPAL TUNNEL RELEASE;  Surgeon: Brunilda Capra, MD;  Location:  [facility];  Service: Orthopedics;  Laterality: Right;  Bier block   CATARACT EXTRACTION  08-11-11   Bilateral   ESOPHAGEAL DILATION     ESOPHAGOGASTRODUODENOSCOPY (EGD) WITH PROPOFOL  N/A 12/06/2021   Procedure: ESOPHAGOGASTRODUODENOSCOPY (EGD) WITH PROPOFOL ;  Surgeon: Elois Hair, MD;  Location: [facility];  Service: Gastroenterology;  Laterality: N/A;   KNEE SURGERY  08-11-11   rt. knee scope   PATELLAR TENDON REPAIR Left 02/22/2014   Procedure: PATELLA TENDON REPAIR;  Surgeon: Aurther Blue, MD;  Location: [facility];  Service: Orthopedics;  Laterality: Left;   PATELLAR TENDON REPAIR Left 06/25/2014   Procedure: LEFT PATELLA TENDON REPAIR;  Surgeon: Liliane Rei, MD;  Location: [facility];  Service: Orthopedics;  Laterality: Left;   SAVORY DILATION N/A 12/06/2021   Procedure: SAVORY DILATION;  Surgeon: Elois Hair, MD;  Location: [facility];  Service: Gastroenterology;  Laterality: N/A;   TOTAL KNEE ARTHROPLASTY  08/16/2011   Procedure: TOTAL KNEE ARTHROPLASTY;  Surgeon: Aurther Blue, MD;  Location: [facility];  Service: Orthopedics;  Laterality: Right;   TOTAL KNEE ARTHROPLASTY Left 01/27/2014   Procedure: LEFT TOTAL KNEE ARTHROPLASTY;  Surgeon: Aurther Blue, MD;  Location: [facility];  Service: Orthopedics;  Laterality: Left;   TRIGGER FINGER RELEASE Right 05/02/2022   Procedure: RELEASE TRIGGER FINGER/A-1 PULLEY RIGHT LONG FINGER;  Surgeon: Brunilda Capra, MD;  Location:  [facility]ERY [facility];  Service: Orthopedics;  Laterality: Right;  Bier block   TUBAL LIGATION       reports that she has never smoked. She has never used smokeless tobacco. She reports that she does not drink alcohol and does not use drugs.  Family History  Problem Relation Age of Onset   Heart disease Father    Coronary artery disease Brother    Diabetes Brother    Heart disease Brother    Pancreatic cancer Daughter    Diabetes Sister    Colon cancer Neg Hx    Colon polyps Neg Hx    Esophageal cancer Neg Hx    Gallbladder disease Neg Hx     Prior to Admission medications   Medication Sig Start Date End Date Taking? Authorizing Provider  APPLE CIDER VINEGAR PO Take 2 tablets by mouth See admin instructions. Chew 2 gummies by mouth at bedtime   Yes [provider]   atorvastatin  (LIPITOR) 10 MG tablet TAKE 1 TABLET DAILY Patient taking differently: Take 10 mg by mouth every evening. 10/18/22  Yes Tower, Manley Seeds, MD  buPROPion  (WELLBUTRIN  XL) 150 MG 24 hr tablet TAKE 2 TABLETS DAILY Patient taking differently: Take 150 mg by mouth in the morning and at bedtime. 12/13/22  Yes Tower, Manley Seeds, MD  calcium  carbonate (TUMS - DOSED IN MG ELEMENTAL CALCIUM ) 500 MG chewable tablet Chew 1 tablet (200 mg of elemental calcium  total) by mouth 4 (four) times daily as needed for indigestion or heartburn. 02/25/14  Yes Perkins, Alexzandrew L, PA-C  CLARITIN  5 MG/5ML syrup Take 10 mg by mouth See admin instructions. Take 10 mg by mouth in the morning for itching that spans the length of the back   Yes [provider]  clopidogrel  (PLAVIX ) 75 MG tablet Take 1 tablet (75 mg total) by mouth daily. Patient  taking differently: Take 75 mg by mouth every evening. 03/06/23  Yes Arnoldo Lapping, MD  diclofenac sodium (VOLTAREN) 1 % GEL Apply 2-4 g topically at bedtime as needed (for pain- affected areas). 12/22/15  Yes [provider]  estradiol  (ESTRACE ) 0.1 MG/GM vaginal cream APPLY A PEA SIZED AMOUNT OF CREAM VAGINALLY TWICE A WEEK 03/10/23  Yes Tower, Manley Seeds, MD  famotidine  (PEPCID ) 40 MG tablet TAKE 1 TABLET DAILY Patient taking differently: Take 40 mg by mouth daily before breakfast. 04/20/23  Yes Tower, Manley Seeds, MD  furosemide  (LASIX ) 20 MG tablet TAKE 1 TABLET DAILY Patient taking differently: Take 20 mg by mouth in the morning. 12/23/22  Yes Arnoldo Lapping, MD  gabapentin  (NEURONTIN ) 100 MG capsule TAKE 2 CAPSULES THREE TIMES A DAY Patient taking differently: Take 100 mg by mouth in the morning and at bedtime. 03/28/23  Yes Tower, Manley Seeds, MD  HYDROcodone -acetaminophen  (NORCO/VICODIN) 5-325 MG tablet 1-2 tabs PO q6 hours prn pain Patient taking differently: Take 1-2 tablets by mouth every 6 (six) hours as needed (for pain). 05/02/22  Yes Brunilda Capra, MD  IMODIUM  A-D 2 MG tablet Take 2 mg by mouth 4 (four) times daily as needed for diarrhea or loose stools.   Yes [provider]  Liniments (ABSORBINE ARTHRITIS STRENGTH EX) Apply 1 application  topically daily as needed (Back pain).   Yes [provider]  metoprolol  succinate (TOPROL -XL) 25 MG 24 hr tablet TAKE ONE-HALF (1/2) TABLET AT BEDTIME 05/12/22  Yes Arnoldo Lapping, MD  nitroGLYCERIN  (NITROSTAT ) 0.4 MG SL tablet Place 1 tablet (0.4 mg total) under the tongue every 5 (five) minutes as needed for chest pain. 05/07/21 05/07/23 Yes Weaver, Scott T, PA-C  pantoprazole  (PROTONIX ) 40 MG tablet Take 1 tablet (40 mg total) by mouth 2 (two) times daily. Patient taking differently: Take 40 mg by mouth 2 (two) times daily as needed (for reflux or heartburn). 11/11/22  Yes Lemmon, Kathy Parker, PA  Polyethyl Glycol-Propyl Glycol (SYSTANE OP) Place 1-2 drops into both eyes 3 (three) times daily as needed (for dryness).   Yes [provider]  sertraline  (ZOLOFT ) 100 MG tablet TAKE 2 TABLETS DAILY Patient taking differently: Take 100 mg by mouth in the morning and at bedtime. 04/14/23  Yes Tower, Manley Seeds, MD  doxycycline  (VIBRA -TABS) 100 MG tablet Take 1 tablet (100 mg total) by mouth 2 (two) times daily. Patient not taking: Reported on 05/07/2023 02/28/23   Tower, Manley Seeds, MD  predniSONE  (DELTASONE ) 10 MG tablet Take 3 pills once daily by mouth for 3 days, then 2 pills once daily for 3 days, then 1 pill once daily for 3 days and then stop Patient not taking: Reported on 05/07/2023 02/28/23   Clemens Curt, MD    Physical Exam: Vitals:   05/07/23 1507 05/07/23 1800 05/07/23 1909  BP: (!) 134/111 (!) 144/81   Pulse: 78 73   Resp: 16 18   Temp: (!) 97.4 F (36.3 C)  (!) 97.5 F (36.4 C)  TempSrc: Oral  Oral  SpO2: 96% 96%    Physical Exam Vitals reviewed.  Constitutional:      Appearance: She is normal weight.  HENT:     Head: Normocephalic.     Nose: Nose normal.      Mouth/Throat:     Mouth: Mucous membranes are moist.     Pharynx: Oropharynx is clear.  Eyes:     Conjunctiva/sclera: Conjunctivae normal.     Pupils: Pupils are equal, round,  and reactive to light.  Cardiovascular:     Rate and Rhythm: Normal rate and regular rhythm.     Pulses: Normal pulses.     Heart sounds: Normal heart sounds.  Pulmonary:     Breath sounds: Normal breath sounds.  Abdominal:     General: Abdomen is flat. Bowel sounds are normal.  Musculoskeletal:        General: Swelling, tenderness, deformity and signs of injury present. Normal range of motion.     Cervical back: Normal range of motion.  Skin:    General: Skin is warm.     Capillary Refill: Capillary refill takes less than 2 seconds.  Neurological:     General: No focal deficit present.     Mental Status: She is alert and oriented to person, place, and time. Mental status is at baseline.        Labs on Admission: I have personally reviewed the patients's labs and imaging studies.  Assessment/Plan Principal Problem:   Hip fracture (HCC)   Intertrochanteric femur fracture - Patient presented after fall from chair-mechanical in nature without syncopal event  Plan: N.p.o. at midnight for possible operation in the morning Hold Plavix   # Hypokalemia-replete potassium  # Hyperlipidemia-continue Lipitor  #Chronic diastolic HF- continue lasix   # Depression-continue bupropion   # GERD-continue Pepcid   # Chronic pain-continue gabapentin   # Hypertension-continue metoprolol   # Depression-continue Zoloft    Admission status: Observation Med-Surg  Certification: The appropriate patient status for this patient is OBSERVATION. Observation status is judged to be reasonable and necessary in order to provide the required intensity of service to ensure the patient's safety. The patient's presenting symptoms, physical exam findings, and initial radiographic and laboratory data in the context of their  medical condition is felt to place them at decreased risk for further clinical deterioration. Furthermore, it is anticipated that the patient will be medically stable for discharge from the hospital within 2 midnights of admission.     Myrl Askew MD [facility] If 7PM-7AM, please contact night-coverage [email or web address]  05/07/2023, 7:12 PM

## 2023-05-07 NOTE — ED Triage Notes (Signed)
 Per EMS lives at home. Was sitting on a swivel barstool and flipped out of it. Left leg rotation. No LOC. On plavix .   BP 146/80 HR 80 96 on RA

## 2023-05-07 NOTE — Progress Notes (Signed)
 Patient ID: [name] [name], female   DOB: 07-25-1934, 89 y.o.   MRN: [id number] Consult received for left hip fracture  Will need operative repair Will work to get this addressed either tomorrow or Tuesday at the latest  Full consult to follow

## 2023-05-07 NOTE — ED Provider Notes (Signed)
 [place] EMERGENCY DEPARTMENT AT [facility] Provider Note   CSN: [id number] Arrival date & time: 05/07/23  1450     History  Chief Complaint  Patient presents with   Fall   Leg Injury   Leg Pain    [name] [name] is a 89 y.o. female.  HPI 89 year old female presents with a fall and left hip pain.  She was on a stool that could swivel and as she was turning the stool kept going and that she fell and landed on her left side.  No head injury or loss of consciousness.  Pain is primarily in her left hip but it hurts further down in her knee, which has been previously replaced as well.  She denies any weakness or numbness in her leg.  She was given some fentanyl  by EMS.  Home Medications Prior to Admission medications   Medication Sig Start Date End Date Taking? Authorizing Provider  APPLE CIDER VINEGAR PO Take 2 tablets by mouth See admin instructions. Chew 2 gummies by mouth at bedtime   Yes [provider]  atorvastatin  (LIPITOR) 10 MG tablet TAKE 1 TABLET DAILY Patient taking differently: Take 10 mg by mouth every evening. 10/18/22  Yes Tower, Manley Seeds, MD  buPROPion  (WELLBUTRIN  XL) 150 MG 24 hr tablet TAKE 2 TABLETS DAILY Patient taking differently: Take 150 mg by mouth in the morning and at bedtime. 12/13/22  Yes Tower, Manley Seeds, MD  calcium  carbonate (TUMS - DOSED IN MG ELEMENTAL CALCIUM ) 500 MG chewable tablet Chew 1 tablet (200 mg of elemental calcium  total) by mouth 4 (four) times daily as needed for indigestion or heartburn. 02/25/14  Yes Perkins, Alexzandrew L, PA-C  CLARITIN  5 MG/5ML syrup Take 10 mg by mouth See admin instructions. Take 10 mg by mouth in the morning for itching that spans the length of the back   Yes [provider]  clopidogrel  (PLAVIX ) 75 MG tablet Take 1 tablet (75 mg total) by mouth daily. Patient taking differently: Take 75 mg by mouth every evening. 03/06/23  Yes Arnoldo Lapping, MD  diclofenac sodium (VOLTAREN) 1 % GEL  Apply 2-4 g topically at bedtime as needed (for pain- affected areas). 12/22/15  Yes [provider]  estradiol  (ESTRACE ) 0.1 MG/GM vaginal cream APPLY A PEA SIZED AMOUNT OF CREAM VAGINALLY TWICE A WEEK 03/10/23  Yes Tower, Manley Seeds, MD  famotidine  (PEPCID ) 40 MG tablet TAKE 1 TABLET DAILY Patient taking differently: Take 40 mg by mouth daily before breakfast. 04/20/23  Yes Tower, Manley Seeds, MD  furosemide  (LASIX ) 20 MG tablet TAKE 1 TABLET DAILY Patient taking differently: Take 20 mg by mouth in the morning. 12/23/22  Yes Arnoldo Lapping, MD  gabapentin  (NEURONTIN ) 100 MG capsule TAKE 2 CAPSULES THREE TIMES A DAY Patient taking differently: Take 100 mg by mouth in the morning and at bedtime. 03/28/23  Yes Tower, Manley Seeds, MD  HYDROcodone -acetaminophen  (NORCO/VICODIN) 5-325 MG tablet 1-2 tabs PO q6 hours prn pain Patient taking differently: Take 1-2 tablets by mouth every 6 (six) hours as needed (for pain). 05/02/22  Yes Brunilda Capra, MD  IMODIUM A-D 2 MG tablet Take 2 mg by mouth 4 (four) times daily as needed for diarrhea or loose stools.   Yes [provider]  Liniments (ABSORBINE ARTHRITIS STRENGTH EX) Apply 1 application  topically daily as needed (Back pain).   Yes [provider]  metoprolol  succinate (TOPROL -XL) 25 MG 24 hr tablet TAKE ONE-HALF (1/2) TABLET AT BEDTIME  05/12/22  Yes Arnoldo Lapping, MD  nitroGLYCERIN  (NITROSTAT ) 0.4 MG SL tablet Place 1 tablet (0.4 mg total) under the tongue every 5 (five) minutes as needed for chest pain. 05/07/21 05/07/23 Yes Weaver, Maryclaire Stoecker T, PA-C  pantoprazole  (PROTONIX ) 40 MG tablet Take 1 tablet (40 mg total) by mouth 2 (two) times daily. Patient taking differently: Take 40 mg by mouth 2 (two) times daily as needed (for reflux or heartburn). 11/11/22  Yes Lemmon, Kathy Parker, PA  Polyethyl Glycol-Propyl Glycol (SYSTANE OP) Place 1-2 drops into both eyes 3 (three) times daily as needed (for dryness).   Yes [provider]   sertraline  (ZOLOFT ) 100 MG tablet TAKE 2 TABLETS DAILY Patient taking differently: Take 100 mg by mouth in the morning and at bedtime. 04/14/23  Yes Tower, Manley Seeds, MD  doxycycline  (VIBRA -TABS) 100 MG tablet Take 1 tablet (100 mg total) by mouth 2 (two) times daily. Patient not taking: Reported on 05/07/2023 02/28/23   Clemens Curt, MD  predniSONE  (DELTASONE ) 10 MG tablet Take 3 pills once daily by mouth for 3 days, then 2 pills once daily for 3 days, then 1 pill once daily for 3 days and then stop Patient not taking: Reported on 05/07/2023 02/28/23   Clemens Curt, MD      Allergies    Alendronate sodium, Celecoxib, Chocolate, Omeprazole, Rabeprazole, Latex, and Sulfa  antibiotics    Review of Systems   Review of Systems  Musculoskeletal:  Positive for arthralgias.  Neurological:  Negative for weakness, numbness and headaches.    Physical Exam Updated Vital Signs BP 136/67 (BP Location: Right Arm)   Pulse 78   Temp 97.7 F (36.5 C) (Oral)   Resp 20   Ht 5\' 4"  (1.626 m)   Wt 82.1 kg   SpO2 96%   BMI 31.07 kg/m  Physical Exam Vitals and nursing note reviewed.  Constitutional:      Appearance: She is well-developed.  HENT:     Head: Normocephalic and atraumatic.  Cardiovascular:     Rate and Rhythm: Normal rate and regular rhythm.     Pulses:          Dorsalis pedis pulses are 2+ on the left side.  Pulmonary:     Effort: Pulmonary effort is normal.  Abdominal:     General: There is no distension.  Musculoskeletal:     Left hip: Tenderness present. Decreased range of motion.     Left upper leg: Tenderness present. No deformity.     Left knee: No swelling, effusion or ecchymosis. Decreased range of motion. Tenderness present.     Left lower leg: Tenderness (proximal) present.     Left ankle: No tenderness. Normal range of motion.     Comments: Patient's left knee is flexed and her left lower leg is angled towards the right side of her body. Normal ROM of left foot and  ankle. Grossly normal sensation.  Skin:    General: Skin is warm and dry.  Neurological:     Mental Status: She is alert.     ED Results / Procedures / Treatments   Labs (all labs ordered are listed, but only abnormal results are displayed) Labs Reviewed  BASIC METABOLIC PANEL WITH GFR - Abnormal; Notable for the following components:      Result Value   Potassium 3.4 (*)    Glucose, Bld 118 (*)    Calcium  8.3 (*)    GFR, Estimated 57 (*)    All other components  within normal limits  CBC WITH DIFFERENTIAL/PLATELET  PROTIME-INR  BASIC METABOLIC PANEL WITH GFR  CBC  TYPE AND SCREEN    EKG EKG Interpretation Date/Time:  Sunday May 07 2023 15:27:14 EDT Ventricular Rate:  71 PR Interval:  173 QRS Duration:  93 QT Interval:  421 QTC Calculation: 458 R Axis:   97  Text Interpretation: Sinus rhythm Right axis deviation RSR' in V1 or V2, probably normal variant Confirmed by Jerilynn Montenegro [phone]) on 05/07/2023 4:14:55 PM  Radiology DG FEMUR MIN 2 VIEWS LEFT Result Date: 05/07/2023 CLINICAL DATA:  Pain after fall. EXAM: LEFT FEMUR 2 VIEWS; PELVIS - 1-2 VIEW COMPARISON:  None Available. FINDINGS: Comminuted and displaced intertrochanteric femur fracture. Apex lateral angulation. Proximal migration of the femoral shaft. There is involvement of both lesser and greater trochanters. The distal femur is intact. No additional femur fracture. Pelvis: Intact bony pelvis. Pubic rami are intact. No pelvic fracture. Lumbosacral fusion hardware is intact were visualized. There is mild bilateral hip osteoarthritis IMPRESSION: 1. Comminuted and displaced intertrochanteric left femur fracture. 2. No pelvic fracture. Electronically Signed   By: Chadwick Colonel M.D.   On: 05/07/2023 17:37   DG Pelvis 1-2 Views Result Date: 05/07/2023 CLINICAL DATA:  Pain after fall. EXAM: LEFT FEMUR 2 VIEWS; PELVIS - 1-2 VIEW COMPARISON:  None Available. FINDINGS: Comminuted and displaced intertrochanteric femur  fracture. Apex lateral angulation. Proximal migration of the femoral shaft. There is involvement of both lesser and greater trochanters. The distal femur is intact. No additional femur fracture. Pelvis: Intact bony pelvis. Pubic rami are intact. No pelvic fracture. Lumbosacral fusion hardware is intact were visualized. There is mild bilateral hip osteoarthritis IMPRESSION: 1. Comminuted and displaced intertrochanteric left femur fracture. 2. No pelvic fracture. Electronically Signed   By: Chadwick Colonel M.D.   On: 05/07/2023 17:37   DG Tibia/Fibula Left Result Date: 05/07/2023 CLINICAL DATA:  Pain after fall. EXAM: LEFT TIBIA AND FIBULA - 2 VIEW COMPARISON:  Knee radiograph 07/01/2014. FINDINGS: No evidence of acute fracture or dislocation. Assessment of the upper aspect of the tibia and fibula is limited due to persistent flexion. Knee arthroplasty is intact were visualized. There is a small joint effusion. Patellar Margeret Sheer is a chronic finding when compared to remote prior exam. IMPRESSION: 1. No acute fracture or dislocation of the left tibia or fibula. 2. Knee arthroplasty. Small knee joint effusion. Electronically Signed   By: Chadwick Colonel M.D.   On: 05/07/2023 17:36    Procedures Procedures    Medications Ordered in ED Medications  atorvastatin  (LIPITOR) tablet 10 mg (10 mg Oral Given 05/07/23 2056)  furosemide  (LASIX ) tablet 20 mg (has no administration in time range)  metoprolol  succinate (TOPROL -XL) 24 hr tablet 12.5 mg (12.5 mg Oral Given 05/07/23 2056)  buPROPion  (WELLBUTRIN  XL) 24 hr tablet 300 mg (has no administration in time range)  sertraline  (ZOLOFT ) tablet 100 mg (100 mg Oral Given 05/07/23 2056)  famotidine  (PEPCID ) tablet 40 mg (has no administration in time range)  gabapentin  (NEURONTIN ) capsule 200 mg (200 mg Oral Given 05/07/23 2056)  acetaminophen  (TYLENOL ) tablet 650 mg (650 mg Oral Given 05/07/23 2056)    Or  acetaminophen  (TYLENOL ) suppository 650 mg ( Rectal See  Alternative 05/07/23 2056)  oxyCODONE  (Oxy IR/ROXICODONE ) immediate release tablet 5 mg (5 mg Oral Given 05/07/23 2057)  ondansetron  (ZOFRAN ) tablet 4 mg (has no administration in time range)    Or  ondansetron  (ZOFRAN ) injection 4 mg (has no administration in time range)  morphine  (PF)  2 MG/ML injection 2 mg (2 mg Intravenous Given 05/07/23 2239)  loratadine  (CLARITIN ) tablet 10 mg (10 mg Oral Given 05/07/23 2055)  ondansetron  (ZOFRAN ) injection 4 mg (4 mg Intravenous Given 05/07/23 1534)  HYDROmorphone  (DILAUDID ) injection 0.5 mg (0.5 mg Intravenous Given 05/07/23 1638)  HYDROmorphone  (DILAUDID ) injection 1 mg (1 mg Intravenous Given 05/07/23 1826)  potassium chloride  (KLOR-CON ) packet 40 mEq (40 mEq Oral Given 05/07/23 2057)    ED Course/ Medical Decision Making/ A&P                                 Medical Decision Making Amount and/or Complexity of Data Reviewed Labs: ordered.    Details: Mild hypokalemia Radiology: ordered and independent interpretation performed.    Details: Intertrochanteric fracture ECG/medicine tests: ordered and independent interpretation performed.    Details: No ischemia  Risk Prescription drug management. Decision regarding hospitalization.   X-rays confirm patient broke her left hip.  She is neurovascularly intact.  Given multiple doses of IV narcotics for pain.  Discussed with Dr. Bernard Brick, who recommends to pull her leg into more neutral position after some pain control, which should help her pain as well as putting her on 5 pounds of Buck's traction.  Advised her to be n.p.o. after midnight for likely surgery tomorrow versus 2 days from now.  Will need hospitalist admission, discussed with Dr. Jeannene Milling.  I was able to straighten out her leg with out any significant difficulty.  Remains neurovascularly intact.        Final Clinical Impression(s) / ED Diagnoses Final diagnoses:  Closed displaced intertrochanteric fracture of left femur, initial encounter  [facility])    Rx / DC Orders ED Discharge Orders     None         Jerilynn Montenegro, MD 05/07/23 2316

## 2023-05-08 ENCOUNTER — Observation Stay (HOSPITAL_COMMUNITY): Admitting: Anesthesiology

## 2023-05-08 DIAGNOSIS — Z743 Need for continuous supervision: Secondary | ICD-10-CM | POA: Diagnosis not present

## 2023-05-08 DIAGNOSIS — I110 Hypertensive heart disease with heart failure: Secondary | ICD-10-CM | POA: Diagnosis present

## 2023-05-08 DIAGNOSIS — G629 Polyneuropathy, unspecified: Secondary | ICD-10-CM | POA: Diagnosis not present

## 2023-05-08 DIAGNOSIS — G8929 Other chronic pain: Secondary | ICD-10-CM | POA: Diagnosis present

## 2023-05-08 DIAGNOSIS — M6281 Muscle weakness (generalized): Secondary | ICD-10-CM | POA: Diagnosis not present

## 2023-05-08 DIAGNOSIS — M80052A Age-related osteoporosis with current pathological fracture, left femur, initial encounter for fracture: Secondary | ICD-10-CM | POA: Diagnosis present

## 2023-05-08 DIAGNOSIS — M80052D Age-related osteoporosis with current pathological fracture, left femur, subsequent encounter for fracture with routine healing: Secondary | ICD-10-CM | POA: Diagnosis not present

## 2023-05-08 DIAGNOSIS — E785 Hyperlipidemia, unspecified: Secondary | ICD-10-CM | POA: Diagnosis present

## 2023-05-08 DIAGNOSIS — R278 Other lack of coordination: Secondary | ICD-10-CM | POA: Diagnosis not present

## 2023-05-08 DIAGNOSIS — E876 Hypokalemia: Secondary | ICD-10-CM | POA: Diagnosis present

## 2023-05-08 DIAGNOSIS — R5383 Other fatigue: Secondary | ICD-10-CM | POA: Diagnosis not present

## 2023-05-08 DIAGNOSIS — I5031 Acute diastolic (congestive) heart failure: Secondary | ICD-10-CM | POA: Diagnosis not present

## 2023-05-08 DIAGNOSIS — Z8616 Personal history of COVID-19: Secondary | ICD-10-CM | POA: Diagnosis not present

## 2023-05-08 DIAGNOSIS — G8918 Other acute postprocedural pain: Secondary | ICD-10-CM | POA: Diagnosis not present

## 2023-05-08 DIAGNOSIS — G500 Trigeminal neuralgia: Secondary | ICD-10-CM | POA: Diagnosis not present

## 2023-05-08 DIAGNOSIS — I10 Essential (primary) hypertension: Secondary | ICD-10-CM | POA: Diagnosis not present

## 2023-05-08 DIAGNOSIS — Z79899 Other long term (current) drug therapy: Secondary | ICD-10-CM | POA: Diagnosis not present

## 2023-05-08 DIAGNOSIS — I2510 Atherosclerotic heart disease of native coronary artery without angina pectoris: Secondary | ICD-10-CM | POA: Diagnosis present

## 2023-05-08 DIAGNOSIS — Z96652 Presence of left artificial knee joint: Secondary | ICD-10-CM | POA: Diagnosis present

## 2023-05-08 DIAGNOSIS — Z8673 Personal history of transient ischemic attack (TIA), and cerebral infarction without residual deficits: Secondary | ICD-10-CM | POA: Diagnosis not present

## 2023-05-08 DIAGNOSIS — I509 Heart failure, unspecified: Secondary | ICD-10-CM | POA: Diagnosis not present

## 2023-05-08 DIAGNOSIS — W19XXXA Unspecified fall, initial encounter: Secondary | ICD-10-CM | POA: Diagnosis not present

## 2023-05-08 DIAGNOSIS — F411 Generalized anxiety disorder: Secondary | ICD-10-CM | POA: Diagnosis present

## 2023-05-08 DIAGNOSIS — I5032 Chronic diastolic (congestive) heart failure: Secondary | ICD-10-CM | POA: Diagnosis present

## 2023-05-08 DIAGNOSIS — R1314 Dysphagia, pharyngoesophageal phase: Secondary | ICD-10-CM | POA: Diagnosis not present

## 2023-05-08 DIAGNOSIS — M25562 Pain in left knee: Secondary | ICD-10-CM | POA: Diagnosis not present

## 2023-05-08 DIAGNOSIS — K219 Gastro-esophageal reflux disease without esophagitis: Secondary | ICD-10-CM | POA: Diagnosis present

## 2023-05-08 DIAGNOSIS — D62 Acute posthemorrhagic anemia: Secondary | ICD-10-CM | POA: Diagnosis not present

## 2023-05-08 DIAGNOSIS — R2681 Unsteadiness on feet: Secondary | ICD-10-CM | POA: Diagnosis not present

## 2023-05-08 DIAGNOSIS — S72132D Displaced apophyseal fracture of left femur, subsequent encounter for closed fracture with routine healing: Secondary | ICD-10-CM | POA: Diagnosis not present

## 2023-05-08 DIAGNOSIS — I25119 Atherosclerotic heart disease of native coronary artery with unspecified angina pectoris: Secondary | ICD-10-CM | POA: Diagnosis not present

## 2023-05-08 DIAGNOSIS — Z7902 Long term (current) use of antithrombotics/antiplatelets: Secondary | ICD-10-CM | POA: Diagnosis not present

## 2023-05-08 DIAGNOSIS — S72009A Fracture of unspecified part of neck of unspecified femur, initial encounter for closed fracture: Secondary | ICD-10-CM | POA: Diagnosis not present

## 2023-05-08 DIAGNOSIS — Z90710 Acquired absence of both cervix and uterus: Secondary | ICD-10-CM | POA: Diagnosis not present

## 2023-05-08 DIAGNOSIS — M810 Age-related osteoporosis without current pathological fracture: Secondary | ICD-10-CM | POA: Diagnosis not present

## 2023-05-08 DIAGNOSIS — K449 Diaphragmatic hernia without obstruction or gangrene: Secondary | ICD-10-CM | POA: Diagnosis present

## 2023-05-08 DIAGNOSIS — Z8249 Family history of ischemic heart disease and other diseases of the circulatory system: Secondary | ICD-10-CM | POA: Diagnosis not present

## 2023-05-08 DIAGNOSIS — F32A Depression, unspecified: Secondary | ICD-10-CM | POA: Diagnosis present

## 2023-05-08 DIAGNOSIS — M25559 Pain in unspecified hip: Secondary | ICD-10-CM | POA: Diagnosis not present

## 2023-05-08 DIAGNOSIS — Z9889 Other specified postprocedural states: Secondary | ICD-10-CM | POA: Diagnosis not present

## 2023-05-08 DIAGNOSIS — W08XXXA Fall from other furniture, initial encounter: Secondary | ICD-10-CM | POA: Diagnosis present

## 2023-05-08 DIAGNOSIS — G4700 Insomnia, unspecified: Secondary | ICD-10-CM | POA: Diagnosis present

## 2023-05-08 DIAGNOSIS — H3530 Unspecified macular degeneration: Secondary | ICD-10-CM | POA: Diagnosis not present

## 2023-05-08 DIAGNOSIS — M129 Arthropathy, unspecified: Secondary | ICD-10-CM | POA: Diagnosis not present

## 2023-05-08 DIAGNOSIS — S72142A Displaced intertrochanteric fracture of left femur, initial encounter for closed fracture: Secondary | ICD-10-CM | POA: Diagnosis not present

## 2023-05-08 DIAGNOSIS — S72002A Fracture of unspecified part of neck of left femur, initial encounter for closed fracture: Secondary | ICD-10-CM | POA: Diagnosis not present

## 2023-05-08 DIAGNOSIS — S72102A Unspecified trochanteric fracture of left femur, initial encounter for closed fracture: Secondary | ICD-10-CM | POA: Diagnosis not present

## 2023-05-08 LAB — BASIC METABOLIC PANEL WITH GFR
Anion gap: 9 (ref 5–15)
BUN: 22 mg/dL (ref 8–23)
CO2: 27 mmol/L (ref 22–32)
Calcium: 9.1 mg/dL (ref 8.9–10.3)
Chloride: 104 mmol/L (ref 98–111)
Creatinine, Ser: 1.27 mg/dL — ABNORMAL HIGH (ref 0.44–1.00)
GFR, Estimated: 41 mL/min — ABNORMAL LOW (ref >60)
Glucose, Bld: 148 mg/dL — ABNORMAL HIGH (ref 70–99)
Potassium: 4.4 mmol/L (ref 3.5–5.1)
Sodium: 140 mmol/L (ref 135–145)

## 2023-05-08 LAB — CBC
HCT: 38.2 % (ref 36.0–46.0)
Hemoglobin: 11.6 g/dL — ABNORMAL LOW (ref 12.0–15.0)
MCH: 27.7 pg (ref 26.0–34.0)
MCHC: 30.4 g/dL (ref 30.0–36.0)
MCV: 91.2 fL (ref 80.0–100.0)
Platelets: 272 10*3/uL (ref 150–400)
RBC: 4.19 MIL/uL (ref 3.87–5.11)
RDW: 14.3 % (ref 11.5–15.5)
WBC: 9.9 10*3/uL (ref 4.0–10.5)
nRBC: 0 % (ref 0.0–0.2)

## 2023-05-08 LAB — SURGICAL PCR SCREEN
MRSA, PCR: NEGATIVE
Staphylococcus aureus: NEGATIVE

## 2023-05-08 MED ORDER — SENNOSIDES-DOCUSATE SODIUM 8.6-50 MG PO TABS
1.0000 | ORAL_TABLET | Freq: Every day | ORAL | Status: DC
  Administered 2023-05-08: 1 via ORAL
  Filled 2023-05-08: qty 1

## 2023-05-08 MED ORDER — GABAPENTIN 100 MG PO CAPS
200.0000 mg | ORAL_CAPSULE | Freq: Every day | ORAL | Status: DC
  Administered 2023-05-09 – 2023-05-11 (×3): 200 mg via ORAL
  Administered 2023-05-12: 100 mg via ORAL
  Filled 2023-05-08 (×4): qty 2

## 2023-05-08 MED ORDER — FENTANYL CITRATE PF 50 MCG/ML IJ SOSY
50.0000 ug | PREFILLED_SYRINGE | INTRAMUSCULAR | Status: DC
  Administered 2023-05-08: 50 ug via INTRAVENOUS
  Filled 2023-05-08: qty 2

## 2023-05-08 MED ORDER — POLYETHYLENE GLYCOL 3350 17 G PO PACK: 17.0000 g | PACK | Freq: Every day | ORAL | Status: DC | PRN

## 2023-05-08 MED ORDER — MUPIROCIN 2 % EX OINT
1.0000 | TOPICAL_OINTMENT | Freq: Two times a day (BID) | CUTANEOUS | Status: DC
  Administered 2023-05-08 (×3): 1 via NASAL
  Filled 2023-05-08: qty 22

## 2023-05-08 MED ORDER — ROPIVACAINE HCL 5 MG/ML IJ SOLN
INTRAMUSCULAR | Status: DC | PRN
  Administered 2023-05-08: 20 mL via PERINEURAL

## 2023-05-08 MED ORDER — ACETAMINOPHEN 500 MG PO TABS
1000.0000 mg | ORAL_TABLET | Freq: Three times a day (TID) | ORAL | Status: DC
  Administered 2023-05-08 (×2): 1000 mg via ORAL
  Filled 2023-05-08 (×2): qty 2

## 2023-05-08 MED ORDER — CLONIDINE HCL (ANALGESIA) 100 MCG/ML EP SOLN
EPIDURAL | Status: DC | PRN
  Administered 2023-05-08: 50 ug

## 2023-05-08 MED ORDER — SODIUM CHLORIDE 0.9 % IV SOLN: INTRAVENOUS | Status: DC

## 2023-05-08 NOTE — Progress Notes (Signed)
 Orthopedic Tech Progress Note Patient Details:  [name] [name] Feb 28, 1934 [id number]  Patient ID: [name] [name], female   DOB: 11/11/1934, 89 y.o.   MRN: [id number] I was called to apply the bucks traction. When I arrived at the room the patient and family member refused the traction. I explained why we needed to apply the traction and how it would help the patient. They still refused, so I left. Terryann Fiddler 05/08/2023, 2:17 AM

## 2023-05-08 NOTE — Anesthesia Procedure Notes (Signed)
 Anesthesia Regional Block: Femoral nerve block   Pre-Anesthetic Checklist: , timeout performed,  Correct Patient, Correct Site, Correct Laterality,  Correct Procedure, Correct Position, site marked,  Risks and benefits discussed,  Surgical consent,  Pre-op evaluation,  At surgeon's request and post-op pain management  Laterality: Left  Prep: chloraprep       Needles:  Injection technique: Single-shot  Needle Type: Echogenic Needle     Needle Length: 9cm  Needle Gauge: 21     Additional Needles:   Procedures:,,,, ultrasound used (permanent image in chart),,    Narrative:  Start time: 05/08/2023 9:02 AM End time: 05/08/2023 9:10 AM Injection made incrementally with aspirations every 5 mL.  Performed by: Personally  Anesthesiologist: Erin Havers, MD  Additional Notes: No pain on injection. No increased resistance to injection. Injection made in 5cc increments.  Good needle visualization.  Patient tolerated procedure well.

## 2023-05-08 NOTE — Consult Note (Signed)
 Reason for Consult: Left hip fracture Referring Physician: Gwynneth Lessen, MD (Hospitalist)  [name] [name] is an 89 y.o. female.  HPI: [name] [name] is a 89 y.o. female with medical history significant of hyperlipidemia, depression, CAD, GERD, chronic pain who presents emergency department after mechanical fall.  Patient was on a stool and fell and landed on her left side.  She denied loss of consciousness or head trauma.  She subsequently developed severe left hip pain radiating into her knee.  EMS was called and she was transported to the ER for further assessment.  On arrival she was afebrile and hemodynamically stable.  Labs were obtained which showed potassium 3.4, creatinine 0.97, WBC 8.9, hemoglobin 12, INR 1.0.  X-ray of tibia and fibula showed intertrochanteric femur fracture.  X-ray of pelvis showed similar findings.  Orthopedics was consulted with plans for possible operative intervention in the morning.  Patient's Plavix  was held and she was admitted for further workup.   Orthopedics consulted for management of her left hip fracture.  Past Medical History:  Diagnosis Date   Alternating constipation and diarrhea    Anxiety state, unspecified    CHF (congestive heart failure) (HCC)    Complication of anesthesia    slow to wake up with surgery several years ago    COVID-19 virus infection 12/2018   Depressive disorder, not elsewhere classified    Diaphragmatic hernia without mention of obstruction or gangrene    Difficulty sleeping    Diverticulosis of colon (without mention of hemorrhage)    Esophageal reflux    External hemorrhoids without mention of complication    Family history of adverse reaction to anesthesia    Dad very slow to wake upi   Fatigue    Frequency of urination    Gallstones    Headache    Helicobacter pylori (H. pylori)    Hiatal hernia    History of nuclear stress test    Myoview  05/2021: EF 70, normal perfusion; low risk   History of transfusion     Lumbar spondylosis    Macular degeneration    Macular degeneration (senile) of retina, unspecified    Osteoarthrosis, unspecified whether generalized or localized, unspecified site 08/11/2011   hx. "rhematoid arthritis", osteoarthritis, DDD, bursitis(hip)   Osteoporosis    Palpitations    PONV (postoperative nausea and vomiting)    Schatzki's ring    Shortness of breath 08/11/2011   with exertion only at present   Unspecified cerebral artery occlusion with cerebral infarction 08/11/2011   '97/ '06( TIA)-affected lt. side, slight weakness L side   Unspecified vitamin D  deficiency     Past Surgical History:  Procedure Laterality Date   ABDOMINAL HYSTERECTOMY  08-11-11   APPENDECTOMY     BACK SURGERY  08-11-11   '11-hx. lumbar fusion with retained hardware   CARPAL TUNNEL RELEASE Right 05/02/2022   Procedure: RIGHT CARPAL TUNNEL RELEASE;  Surgeon: Brunilda Capra, MD;  Location: [facility];  Service: Orthopedics;  Laterality: Right;  Bier block   CATARACT EXTRACTION  08-11-11   Bilateral   ESOPHAGEAL DILATION     ESOPHAGOGASTRODUODENOSCOPY (EGD) WITH PROPOFOL  N/A 12/06/2021   Procedure: ESOPHAGOGASTRODUODENOSCOPY (EGD) WITH PROPOFOL ;  Surgeon: Elois Hair, MD;  Location: [facility];  Service: Gastroenterology;  Laterality: N/A;   KNEE SURGERY  08-11-11   rt. knee scope   PATELLAR TENDON REPAIR Left 02/22/2014   Procedure: PATELLA TENDON REPAIR;  Surgeon: Aurther Blue, MD;  Location: [facility];  Service: Orthopedics;  Laterality: Left;   PATELLAR TENDON REPAIR Left 06/25/2014   Procedure: LEFT PATELLA TENDON REPAIR;  Surgeon: Liliane Rei, MD;  Location: [facility];  Service: Orthopedics;  Laterality: Left;   SAVORY DILATION N/A 12/06/2021   Procedure: SAVORY DILATION;  Surgeon: Elois Hair, MD;  Location: [facility];  Service: Gastroenterology;  Laterality: N/A;   TOTAL KNEE ARTHROPLASTY  08/16/2011   Procedure: TOTAL KNEE ARTHROPLASTY;  Surgeon: Aurther Blue,  MD;  Location: [facility];  Service: Orthopedics;  Laterality: Right;   TOTAL KNEE ARTHROPLASTY Left 01/27/2014   Procedure: LEFT TOTAL KNEE ARTHROPLASTY;  Surgeon: Aurther Blue, MD;  Location: [facility];  Service: Orthopedics;  Laterality: Left;   TRIGGER FINGER RELEASE Right 05/02/2022   Procedure: RELEASE TRIGGER FINGER/A-1 PULLEY RIGHT LONG FINGER;  Surgeon: Brunilda Capra, MD;  Location: [facility];  Service: Orthopedics;  Laterality: Right;  Bier block   TUBAL LIGATION      Family History  Problem Relation Age of Onset   Heart disease Father    Coronary artery disease Brother    Diabetes Brother    Heart disease Brother    Pancreatic cancer Daughter    Diabetes Sister    Colon cancer Neg Hx    Colon polyps Neg Hx    Esophageal cancer Neg Hx    Gallbladder disease Neg Hx     Social History:  reports that she has never smoked. She has never used smokeless tobacco. She reports that she does not drink alcohol and does not use drugs.  Allergies:  Allergies  Allergen Reactions   Alendronate Sodium Other (See Comments)    JAW PAIN   Celecoxib Other (See Comments)    GI UPSET   Chocolate Diarrhea   Omeprazole Nausea Only   Rabeprazole Nausea And Vomiting   Latex Itching, Dermatitis and Rash   Sulfa  Antibiotics Rash and Dermatitis    Medications: I have reviewed the patient's current medications. Scheduled:  atorvastatin   10 mg Oral QHS   buPROPion   300 mg Oral Daily   famotidine   40 mg Oral Daily   furosemide   20 mg Oral Daily   gabapentin   200 mg Oral TID   metoprolol  succinate  12.5 mg Oral QHS   mupirocin  ointment  1 Application Nasal BID   sertraline   100 mg Oral BID    Results for orders placed or performed during the hospital encounter of 05/07/23 (from the past 24 hours)  Basic metabolic panel     Status: Abnormal   Collection Time: 05/07/23  3:38 PM  Result Value Ref Range   Sodium 140 135 - 145 mmol/L   Potassium 3.4 (L) 3.5 - 5.1 mmol/L    Chloride 105 98 - 111 mmol/L   CO2 26 22 - 32 mmol/L   Glucose, Bld 118 (H) 70 - 99 mg/dL   BUN 21 8 - 23 mg/dL   Creatinine, Ser 4.69 0.44 - 1.00 mg/dL   Calcium  8.3 (L) 8.9 - 10.3 mg/dL   GFR, Estimated 57 (L) >60 mL/min   Anion gap 9 5 - 15  CBC with Differential     Status: None   Collection Time: 05/07/23  3:38 PM  Result Value Ref Range   WBC 8.9 4.0 - 10.5 K/uL   RBC 4.27 3.87 - 5.11 MIL/uL   Hemoglobin 12.0 12.0 - 15.0 g/dL   HCT 62.9 52.8 - 41.3 %   MCV 88.5 80.0 - 100.0 fL   MCH 28.1 26.0 -  34.0 pg   MCHC 31.7 30.0 - 36.0 g/dL   RDW 19.1 47.8 - 29.5 %   Platelets 257 150 - 400 K/uL   nRBC 0.0 0.0 - 0.2 %   Neutrophils Relative % 69 %   Neutro Abs 6.2 1.7 - 7.7 K/uL   Lymphocytes Relative 16 %   Lymphs Abs 1.5 0.7 - 4.0 K/uL   Monocytes Relative 9 %   Monocytes Absolute 0.8 0.1 - 1.0 K/uL   Eosinophils Relative 5 %   Eosinophils Absolute 0.4 0.0 - 0.5 K/uL   Basophils Relative 1 %   Basophils Absolute 0.1 0.0 - 0.1 K/uL   Immature Granulocytes 0 %   Abs Immature Granulocytes 0.04 0.00 - 0.07 K/uL  Protime-INR     Status: None   Collection Time: 05/07/23  3:38 PM  Result Value Ref Range   Prothrombin Time 13.5 11.4 - 15.2 seconds   INR 1.0 0.8 - 1.2  Type and screen [facility]     Status: None   Collection Time: 05/07/23  3:38 PM  Result Value Ref Range   ABO/RH(D) B POS    Antibody Screen NEG    Sample Expiration      05/10/2023,2359 Performed at [facility], [address], [place], [place] [address]   Basic metabolic panel     Status: Abnormal   Collection Time: 05/08/23  3:47 AM  Result Value Ref Range   Sodium 140 135 - 145 mmol/L   Potassium 4.4 3.5 - 5.1 mmol/L   Chloride 104 98 - 111 mmol/L   CO2 27 22 - 32 mmol/L   Glucose, Bld 148 (H) 70 - 99 mg/dL   BUN 22 8 - 23 mg/dL   Creatinine, Ser 8.65 (H) 0.44 - 1.00 mg/dL   Calcium  9.1 8.9 - 10.3 mg/dL   GFR, Estimated 41 (L) >60 mL/min   Anion gap 9 5 - 15   CBC     Status: Abnormal   Collection Time: 05/08/23  3:47 AM  Result Value Ref Range   WBC 9.9 4.0 - 10.5 K/uL   RBC 4.19 3.87 - 5.11 MIL/uL   Hemoglobin 11.6 (L) 12.0 - 15.0 g/dL   HCT 78.4 69.6 - 29.5 %   MCV 91.2 80.0 - 100.0 fL   MCH 27.7 26.0 - 34.0 pg   MCHC 30.4 30.0 - 36.0 g/dL   RDW 28.4 13.2 - 44.0 %   Platelets 272 150 - 400 K/uL   nRBC 0.0 0.0 - 0.2 %  Surgical PCR screen     Status: None   Collection Time: 05/08/23  4:05 AM   Specimen: Nasal Mucosa; Nasal Swab  Result Value Ref Range   MRSA, PCR NEGATIVE NEGATIVE   Staphylococcus aureus NEGATIVE NEGATIVE     X-ray: CLINICAL DATA:  Pain after fall.   EXAM: LEFT FEMUR 2 VIEWS; PELVIS - 1-2 VIEW   COMPARISON:  None Available.   FINDINGS: Comminuted and displaced intertrochanteric femur fracture. Apex lateral angulation. Proximal migration of the femoral shaft. There is involvement of both lesser and greater trochanters. The distal femur is intact. No additional femur fracture.   Pelvis: Intact bony pelvis. Pubic rami are intact. No pelvic fracture. Lumbosacral fusion hardware is intact were visualized. There is mild bilateral hip osteoarthritis   IMPRESSION: 1. Comminuted and displaced intertrochanteric left femur fracture. 2. No pelvic fracture.     Electronically Signed   By: Chadwick Colonel M.D.  ROS: As per  HPI  Blood pressure 127/69, pulse 91, temperature 98 F (36.7 C), temperature source Oral, resp. rate 17, height 5\' 4"  (1.626 m), weight 82.1 kg, SpO2 96%.  Physical Exam: Constitutional:      Appearance: She is normal weight.  HENT:     Head: Normocephalic.     Nose: Nose normal.     Mouth/Throat:     Mouth: Mucous membranes are moist.     Pharynx: Oropharynx is clear.  Eyes:     Conjunctiva/sclera: Conjunctivae normal.     Pupils: Pupils are equal, round, and reactive to light.  Cardiovascular:     Rate and Rhythm: Normal rate and regular rhythm.     Pulses: Normal pulses.      Heart sounds: Normal heart sounds.  Pulmonary:     Breath sounds: Normal breath sounds.  Abdominal:     General: Abdomen is flat. Bowel sounds are normal.  Musculoskeletal:        General: Swelling, tenderness, deformity and signs of injury present. Normal range of motion.     Cervical back: Normal range of motion.  Left lower extremity reveals shortening and external rotation.  Range of motion not assessed due to fracture pattern and pain level Skin:    General: Skin is warm.     Capillary Refill: Capillary refill takes less than 2 seconds.  Neurological:     General: No focal deficit present.     Mental Status: She is alert and oriented to person, place, and time. Mental status is at baseline.   Assessment/Plan: 1.  Acute comminuted left intertrochanteric femur fracture  Plan: She will need operative repair of this intertrochanteric femur fracture for pain control and functional capabilities.  Will work to get her into the operating room within the next 1 to 2 days based on timing the OR availability. Pain control until that time Based on her presentation in the emergency room we recommended mild sedation and bringing her leg to extension and applying Buck's traction with 5 pounds.  Anesthetic anterior hip block ordered.  Bevin Bucks 05/08/2023, 6:36 AM

## 2023-05-08 NOTE — H&P (View-Only) (Signed)
 Reason for Consult: Left hip fracture Referring Physician: Gwynneth Lessen, MD (Hospitalist)  [name] [name] is an 89 y.o. female.  HPI: [name] [name] is a 89 y.o. female with medical history significant of hyperlipidemia, depression, CAD, GERD, chronic pain who presents emergency department after mechanical fall.  Patient was on a stool and fell and landed on her left side.  She denied loss of consciousness or head trauma.  She subsequently developed severe left hip pain radiating into her knee.  EMS was called and she was transported to the ER for further assessment.  On arrival she was afebrile and hemodynamically stable.  Labs were obtained which showed potassium 3.4, creatinine 0.97, WBC 8.9, hemoglobin 12, INR 1.0.  X-ray of tibia and fibula showed intertrochanteric femur fracture.  X-ray of pelvis showed similar findings.  Orthopedics was consulted with plans for possible operative intervention in the morning.  Patient's Plavix  was held and she was admitted for further workup.   Orthopedics consulted for management of her left hip fracture.  Past Medical History:  Diagnosis Date   Alternating constipation and diarrhea    Anxiety state, unspecified    CHF (congestive heart failure) (HCC)    Complication of anesthesia    slow to wake up with surgery several years ago    COVID-19 virus infection 12/2018   Depressive disorder, not elsewhere classified    Diaphragmatic hernia without mention of obstruction or gangrene    Difficulty sleeping    Diverticulosis of colon (without mention of hemorrhage)    Esophageal reflux    External hemorrhoids without mention of complication    Family history of adverse reaction to anesthesia    Dad very slow to wake upi   Fatigue    Frequency of urination    Gallstones    Headache    Helicobacter pylori (H. pylori)    Hiatal hernia    History of nuclear stress test    Myoview  05/2021: EF 70, normal perfusion; low risk   History of transfusion     Lumbar spondylosis    Macular degeneration    Macular degeneration (senile) of retina, unspecified    Osteoarthrosis, unspecified whether generalized or localized, unspecified site 08/11/2011   hx. "rhematoid arthritis", osteoarthritis, DDD, bursitis(hip)   Osteoporosis    Palpitations    PONV (postoperative nausea and vomiting)    Schatzki's ring    Shortness of breath 08/11/2011   with exertion only at present   Unspecified cerebral artery occlusion with cerebral infarction 08/11/2011   '97/ '06( TIA)-affected lt. side, slight weakness L side   Unspecified vitamin D  deficiency     Past Surgical History:  Procedure Laterality Date   ABDOMINAL HYSTERECTOMY  08-11-11   APPENDECTOMY     BACK SURGERY  08-11-11   '11-hx. lumbar fusion with retained hardware   CARPAL TUNNEL RELEASE Right 05/02/2022   Procedure: RIGHT CARPAL TUNNEL RELEASE;  Surgeon: Brunilda Capra, MD;  Location: [facility];  Service: Orthopedics;  Laterality: Right;  Bier block   CATARACT EXTRACTION  08-11-11   Bilateral   ESOPHAGEAL DILATION     ESOPHAGOGASTRODUODENOSCOPY (EGD) WITH PROPOFOL  N/A 12/06/2021   Procedure: ESOPHAGOGASTRODUODENOSCOPY (EGD) WITH PROPOFOL ;  Surgeon: Elois Hair, MD;  [facility]: [facility];  Service: Gastroenterology;  Laterality: N/A;   KNEE SURGERY  08-11-11   rt. knee scope   PATELLAR TENDON REPAIR Left 02/22/2014   Procedure: PATELLA TENDON REPAIR;  Surgeon: Aurther Blue, MD;  Location: [facility];  Service: Orthopedics;  Laterality: Left;   PATELLAR TENDON REPAIR Left 06/25/2014   Procedure: LEFT PATELLA TENDON REPAIR;  Surgeon: Liliane Rei, MD;  Location: [facility];  Service: Orthopedics;  Laterality: Left;   SAVORY DILATION N/A 12/06/2021   Procedure: SAVORY DILATION;  Surgeon: Elois Hair, MD;  Location: [facility];  Service: Gastroenterology;  Laterality: N/A;   TOTAL KNEE ARTHROPLASTY  08/16/2011   Procedure: TOTAL KNEE ARTHROPLASTY;  Surgeon: Aurther Blue,  MD;  Location: [facility];  Service: Orthopedics;  Laterality: Right;   TOTAL KNEE ARTHROPLASTY Left 01/27/2014   Procedure: LEFT TOTAL KNEE ARTHROPLASTY;  Surgeon: Aurther Blue, MD;  Location: [facility];  Service: Orthopedics;  Laterality: Left;   TRIGGER FINGER RELEASE Right 05/02/2022   Procedure: RELEASE TRIGGER FINGER/A-1 PULLEY RIGHT LONG FINGER;  Surgeon: Brunilda Capra, MD;  Location: [facility];  Service: Orthopedics;  Laterality: Right;  Bier block   TUBAL LIGATION      Family History  Problem Relation Age of Onset   Heart disease Father    Coronary artery disease Brother    Diabetes Brother    Heart disease Brother    Pancreatic cancer Daughter    Diabetes Sister    Colon cancer Neg Hx    Colon polyps Neg Hx    Esophageal cancer Neg Hx    Gallbladder disease Neg Hx     Social History:  reports that she has never smoked. She has never used smokeless tobacco. She reports that she does not drink alcohol and does not use drugs.  Allergies:  Allergies  Allergen Reactions   Alendronate Sodium Other (See Comments)    JAW PAIN   Celecoxib Other (See Comments)    GI UPSET   Chocolate Diarrhea   Omeprazole Nausea Only   Rabeprazole Nausea And Vomiting   Latex Itching, Dermatitis and Rash   Sulfa  Antibiotics Rash and Dermatitis    Medications: I have reviewed the patient's current medications. Scheduled:  atorvastatin   10 mg Oral QHS   buPROPion   300 mg Oral Daily   famotidine   40 mg Oral Daily   furosemide   20 mg Oral Daily   gabapentin   200 mg Oral TID   metoprolol  succinate  12.5 mg Oral QHS   mupirocin  ointment  1 Application Nasal BID   sertraline   100 mg Oral BID    Results for orders placed or performed during the hospital encounter of 05/07/23 (from the past 24 hours)  Basic metabolic panel     Status: Abnormal   Collection Time: 05/07/23  3:38 PM  Result Value Ref Range   Sodium 140 135 - 145 mmol/L   Potassium 3.4 (L) 3.5 - 5.1 mmol/L    Chloride 105 98 - 111 mmol/L   CO2 26 22 - 32 mmol/L   Glucose, Bld 118 (H) 70 - 99 mg/dL   BUN 21 8 - 23 mg/dL   Creatinine, Ser 4.09 0.44 - 1.00 mg/dL   Calcium  8.3 (L) 8.9 - 10.3 mg/dL   GFR, Estimated 57 (L) >60 mL/min   Anion gap 9 5 - 15  CBC with Differential     Status: None   Collection Time: 05/07/23  3:38 PM  Result Value Ref Range   WBC 8.9 4.0 - 10.5 K/uL   RBC 4.27 3.87 - 5.11 MIL/uL   Hemoglobin 12.0 12.0 - 15.0 g/dL   HCT 81.1 91.4 - 78.2 %   MCV 88.5 80.0 - 100.0 fL   MCH 28.1 26.0 -  34.0 pg   MCHC 31.7 30.0 - 36.0 g/dL   RDW 40.9 81.1 - 91.4 %   Platelets 257 150 - 400 K/uL   nRBC 0.0 0.0 - 0.2 %   Neutrophils Relative % 69 %   Neutro Abs 6.2 1.7 - 7.7 K/uL   Lymphocytes Relative 16 %   Lymphs Abs 1.5 0.7 - 4.0 K/uL   Monocytes Relative 9 %   Monocytes Absolute 0.8 0.1 - 1.0 K/uL   Eosinophils Relative 5 %   Eosinophils Absolute 0.4 0.0 - 0.5 K/uL   Basophils Relative 1 %   Basophils Absolute 0.1 0.0 - 0.1 K/uL   Immature Granulocytes 0 %   Abs Immature Granulocytes 0.04 0.00 - 0.07 K/uL  Protime-INR     Status: None   Collection Time: 05/07/23  3:38 PM  Result Value Ref Range   Prothrombin Time 13.5 11.4 - 15.2 seconds   INR 1.0 0.8 - 1.2  Type and screen [facility]     Status: None   Collection Time: 05/07/23  3:38 PM  Result Value Ref Range   ABO/RH(D) B POS    Antibody Screen NEG    Sample Expiration      05/10/2023,2359 Performed at [facility], [address], [place], [place] [address]   Basic metabolic panel     Status: Abnormal   Collection Time: 05/08/23  3:47 AM  Result Value Ref Range   Sodium 140 135 - 145 mmol/L   Potassium 4.4 3.5 - 5.1 mmol/L   Chloride 104 98 - 111 mmol/L   CO2 27 22 - 32 mmol/L   Glucose, Bld 148 (H) 70 - 99 mg/dL   BUN 22 8 - 23 mg/dL   Creatinine, Ser 6.21 (H) 0.44 - 1.00 mg/dL   Calcium  9.1 8.9 - 10.3 mg/dL   GFR, Estimated 41 (L) >60 mL/min   Anion gap 9 5 - 15   CBC     Status: Abnormal   Collection Time: 05/08/23  3:47 AM  Result Value Ref Range   WBC 9.9 4.0 - 10.5 K/uL   RBC 4.19 3.87 - 5.11 MIL/uL   Hemoglobin 11.6 (L) 12.0 - 15.0 g/dL   HCT 30.8 65.7 - 84.6 %   MCV 91.2 80.0 - 100.0 fL   MCH 27.7 26.0 - 34.0 pg   MCHC 30.4 30.0 - 36.0 g/dL   RDW 96.2 95.2 - 84.1 %   Platelets 272 150 - 400 K/uL   nRBC 0.0 0.0 - 0.2 %  Surgical PCR screen     Status: None   Collection Time: 05/08/23  4:05 AM   Specimen: Nasal Mucosa; Nasal Swab  Result Value Ref Range   MRSA, PCR NEGATIVE NEGATIVE   Staphylococcus aureus NEGATIVE NEGATIVE     X-ray: CLINICAL DATA:  Pain after fall.   EXAM: LEFT FEMUR 2 VIEWS; PELVIS - 1-2 VIEW   COMPARISON:  None Available.   FINDINGS: Comminuted and displaced intertrochanteric femur fracture. Apex lateral angulation. Proximal migration of the femoral shaft. There is involvement of both lesser and greater trochanters. The distal femur is intact. No additional femur fracture.   Pelvis: Intact bony pelvis. Pubic rami are intact. No pelvic fracture. Lumbosacral fusion hardware is intact were visualized. There is mild bilateral hip osteoarthritis   IMPRESSION: 1. Comminuted and displaced intertrochanteric left femur fracture. 2. No pelvic fracture.     Electronically Signed   By: Chadwick Colonel M.D.  ROS: As per  HPI  Blood pressure 127/69, pulse 91, temperature 98 F (36.7 C), temperature source Oral, resp. rate 17, height 5\' 4"  (1.626 m), weight 82.1 kg, SpO2 96%.  Physical Exam: Constitutional:      Appearance: She is normal weight.  HENT:     Head: Normocephalic.     Nose: Nose normal.     Mouth/Throat:     Mouth: Mucous membranes are moist.     Pharynx: Oropharynx is clear.  Eyes:     Conjunctiva/sclera: Conjunctivae normal.     Pupils: Pupils are equal, round, and reactive to light.  Cardiovascular:     Rate and Rhythm: Normal rate and regular rhythm.     Pulses: Normal pulses.      Heart sounds: Normal heart sounds.  Pulmonary:     Breath sounds: Normal breath sounds.  Abdominal:     General: Abdomen is flat. Bowel sounds are normal.  Musculoskeletal:        General: Swelling, tenderness, deformity and signs of injury present. Normal range of motion.     Cervical back: Normal range of motion.  Left lower extremity reveals shortening and external rotation.  Range of motion not assessed due to fracture pattern and pain level Skin:    General: Skin is warm.     Capillary Refill: Capillary refill takes less than 2 seconds.  Neurological:     General: No focal deficit present.     Mental Status: She is alert and oriented to person, place, and time. Mental status is at baseline.   Assessment/Plan: 1.  Acute comminuted left intertrochanteric femur fracture  Plan: She will need operative repair of this intertrochanteric femur fracture for pain control and functional capabilities.  Will work to get her into the operating room within the next 1 to 2 days based on timing the OR availability. Pain control until that time Based on her presentation in the emergency room we recommended mild sedation and bringing her leg to extension and applying Buck's traction with 5 pounds.  Anesthetic anterior hip block ordered.  Bevin Bucks 05/08/2023, 6:36 AM

## 2023-05-08 NOTE — Progress Notes (Signed)
 PROGRESS NOTE  [name] [name]  DOB: 03/15/1934  PCP: Clemens Curt, MD OZH:[id number]  DOA: 05/07/2023  LOS: 0 days  Hospital Day: 2  Brief narrative: [name] [name] is a 89 y.o. female with PMH significant for HTN, HLD, CAD, CHF, GERD, chronic pain, anxiety/depression 4/20, patient was brought to the ED after a mechanical fall landing on her left side, sustained severe left hip pain Imaging in the ED showed intertrochanteric femur fracture Orthopedics was consulted Admitted to [facility]  Subjective: Patient was seen and examined this morning.  Pleasant elderly Caucasian female. Drowsy under the effect of pain meds and nerve block. Daughter at bedside. Chart reviewed In the last 24 hours, afebrile, hemodynamically stable Labs this morning with creatinine elevated to 1.27 compared to 0.97 yesterday.  Plan to hold Lasix   Assessment and plan: Left intertrochanteric femur fracture Secondary to mechanical fall Imaging as above. Orthopedics consulted. Surgical intervention planned today.  Plavix  on hold Pain regimen: Scheduled Tylenol , as needed morphine , as needed oxycodone  Bowel regimen: Scheduled Senokot, as needed MiraLAX  DVT prophylaxis: Currently on SCDs  Chronic pain Chronically on gabapentin  3 times daily. While on opioids, switch gabapentin  to nightly only  Hypokalemia Potassium level improved with replacement Recent Labs  Lab 05/07/23 1538 05/08/23 0347  K 3.4* 4.4   Chronic diastolic CHF Hypertension Currently compensated CHF PTA meds-Toprol  25 mg daily, Lasix  20 mg daily Continue metoprolol .  Keep Lasix  on hold  CAD, HLD Continue metoprolol , Lipitor Plavix  on hold  GERD Pepcid   Anxiety/depression Zoloft , bupropion     Mobility: PT eval postprocedure  Goals of care   Code Status: Full Code     DVT prophylaxis:  SCDs Start: 05/07/23 1859   Antimicrobials: None Fluid: None Consultants: Orthopedics Family Communication: Daughter at  bedside  Status: Observation Level of care:  Med-Surg   Patient is from: Home Needs to continue in-hospital care: Pending OR Anticipated d/c to: Home versus rehab      Diet:  Diet Order             Diet regular Room service appropriate? Yes; Fluid consistency: Thin  Diet effective now                   Scheduled Meds:  acetaminophen   1,000 mg Oral TID   atorvastatin   10 mg Oral QHS   buPROPion   300 mg Oral Daily   famotidine   40 mg Oral Daily   fentaNYL  (SUBLIMAZE ) injection  50-100 mcg Intravenous UD   [START ON 05/09/2023] gabapentin   200 mg Oral QHS   metoprolol  succinate  12.5 mg Oral QHS   mupirocin  ointment  1 Application Nasal BID   senna-docusate  1 tablet Oral QHS   sertraline   100 mg Oral BID    PRN meds: loratadine , morphine  injection, ondansetron  **OR** ondansetron  (ZOFRAN ) IV, oxyCODONE , polyethylene glycol   Infusions:   sodium chloride  40 mL/hr at 05/08/23 1033    Antimicrobials: Anti-infectives (From admission, onward)    None       Objective: Vitals:   05/08/23 0917 05/08/23 1034  BP:  115/71  Pulse: 93 92  Resp:  16  Temp:  97.9 F (36.6 C)  SpO2: 92% 100%    Intake/Output Summary (Last 24 hours) at 05/08/2023 1307 Last data filed at 05/08/2023 0545 Gross per 24 hour  Intake 240 ml  Output 100 ml  Net 140 ml   Filed Weights   05/07/23 2007  Weight: 82.1 kg   Weight change:  Body  mass index is 31.07 kg/m.   Physical Exam: General exam: Pleasant, elderly Caucasian female.  Pain controlled Skin: No rashes, lesions or ulcers. HEENT: Atraumatic, normocephalic, no obvious bleeding Lungs: Clear to auscultation bilaterally,  CVS: S1, S2, no murmur,   GI/Abd: Soft, nontender, nondistended, bowel sound present,   CNS: Slightly drowsy but opens eyes on command.  Alert, oriented x 3 Psychiatry: Mood appropriate,  Extremities: No pedal edema, no calf tenderness,   Data Review: I have personally reviewed the laboratory data  and studies available.  F/u labs  Unresulted Labs (From admission, onward)     Start     Ordered   05/09/23 0500  Basic metabolic panel with GFR  Tomorrow morning,   R       Question:  Specimen collection method  Answer:  Lab=Lab collect   05/08/23 0823   05/09/23 0500  CBC with Differential/Platelet  Tomorrow morning,   R       Question:  Specimen collection method  Answer:  Lab=Lab collect   05/08/23 0823           Total time spent in review of labs and imaging, patient evaluation, formulation of plan, documentation and communication with family: 45 minutes  Signed, Hoyt Macleod, MD [facility] 05/08/2023

## 2023-05-08 NOTE — Progress Notes (Signed)
 Bladder scan 400 mL. Messaged Denece Finger to request Foley Catheter placement.

## 2023-05-08 NOTE — Plan of Care (Signed)
   Problem: Education: Goal: Knowledge of General Education information will improve Description Including pain rating scale, medication(s)/side effects and non-pharmacologic comfort measures Outcome: Progressing   Problem: Health Behavior/Discharge Planning: Goal: Ability to manage health-related needs will improve Outcome: Progressing

## 2023-05-08 NOTE — Anesthesia Preprocedure Evaluation (Addendum)
 Anesthesia Evaluation  Patient identified by MRN, date of birth, ID band Patient awake    Reviewed: Allergy & Precautions, NPO status , Patient's Chart, lab work & pertinent test results  History of Anesthesia Complications (+) PONV and history of anesthetic complications  Airway Mallampati: II  TM Distance: >3 FB Neck ROM: Full    Dental  (+) Dental Advisory Given, Lower Dentures, Upper Dentures   Pulmonary neg pulmonary ROS   Pulmonary exam normal breath sounds clear to auscultation       Cardiovascular hypertension, Pt. on home beta blockers and Pt. on medications + CAD and +CHF  Normal cardiovascular exam Rhythm:Regular Rate:Normal  Stress Test 05/20/21: Normal perfusion. LVEF 70% with normal wall motion. This is a low risk study. No change compared to prior study in 2017.   Neuro/Psych  Headaches PSYCHIATRIC DISORDERS Anxiety Depression     Neuromuscular disease CVA, Residual Symptoms    GI/Hepatic Neg liver ROS, hiatal hernia,GERD  Medicated,,  Endo/Other  negative endocrine ROS    Renal/GU negative Renal ROS     Musculoskeletal  (+) Arthritis ,    Abdominal   Peds  Hematology  (+) Blood dyscrasia (Plavix ), anemia Obesity    Anesthesia Other Findings   Reproductive/Obstetrics                             Anesthesia Physical Anesthesia Plan  ASA: 3  Anesthesia Plan:    Post-op Pain Management: Regional block*   Induction:   PONV Risk Score and Plan: 3 and Treatment may vary due to age or medical condition  Airway Management Planned: Natural Airway  Additional Equipment:   Intra-op Plan:   Post-operative Plan:   Informed Consent: I have reviewed the patients History and Physical, chart, labs and discussed the procedure including the risks, benefits and alternatives for the proposed anesthesia with the patient or authorized representative who has indicated his/her  understanding and acceptance.       Plan Discussed with:   Anesthesia Plan Comments:         Anesthesia Quick Evaluation

## 2023-05-09 ENCOUNTER — Inpatient Hospital Stay (HOSPITAL_COMMUNITY)

## 2023-05-09 ENCOUNTER — Inpatient Hospital Stay (HOSPITAL_COMMUNITY): Admitting: Anesthesiology

## 2023-05-09 ENCOUNTER — Encounter (HOSPITAL_COMMUNITY): Payer: Self-pay | Admitting: Internal Medicine

## 2023-05-09 ENCOUNTER — Other Ambulatory Visit: Payer: Self-pay

## 2023-05-09 ENCOUNTER — Encounter (HOSPITAL_COMMUNITY)
Admission: EM | Disposition: A | Discharge status: Skilled Nursing Facility | Payer: Self-pay | Source: Home / Self Care | Attending: Internal Medicine

## 2023-05-09 DIAGNOSIS — I2510 Atherosclerotic heart disease of native coronary artery without angina pectoris: Secondary | ICD-10-CM

## 2023-05-09 DIAGNOSIS — I509 Heart failure, unspecified: Secondary | ICD-10-CM | POA: Diagnosis not present

## 2023-05-09 DIAGNOSIS — S72142A Displaced intertrochanteric fracture of left femur, initial encounter for closed fracture: Secondary | ICD-10-CM | POA: Diagnosis not present

## 2023-05-09 DIAGNOSIS — I110 Hypertensive heart disease with heart failure: Secondary | ICD-10-CM | POA: Diagnosis not present

## 2023-05-09 DIAGNOSIS — S72002A Fracture of unspecified part of neck of left femur, initial encounter for closed fracture: Secondary | ICD-10-CM | POA: Diagnosis not present

## 2023-05-09 HISTORY — PX: INTRAMEDULLARY (IM) NAIL INTERTROCHANTERIC: SHX5875

## 2023-05-09 LAB — CBC WITH DIFFERENTIAL/PLATELET
Abs Immature Granulocytes: 0.03 10*3/uL (ref 0.00–0.07)
Basophils Absolute: 0 10*3/uL (ref 0.0–0.1)
Basophils Relative: 0 %
Eosinophils Absolute: 0.2 10*3/uL (ref 0.0–0.5)
Eosinophils Relative: 2 %
HCT: 34.4 % — ABNORMAL LOW (ref 36.0–46.0)
Hemoglobin: 10.5 g/dL — ABNORMAL LOW (ref 12.0–15.0)
Immature Granulocytes: 0 %
Lymphocytes Relative: 11 %
Lymphs Abs: 0.9 10*3/uL (ref 0.7–4.0)
MCH: 28.2 pg (ref 26.0–34.0)
MCHC: 30.5 g/dL (ref 30.0–36.0)
MCV: 92.2 fL (ref 80.0–100.0)
Monocytes Absolute: 1.1 10*3/uL — ABNORMAL HIGH (ref 0.1–1.0)
Monocytes Relative: 13 %
Neutro Abs: 6.2 10*3/uL (ref 1.7–7.7)
Neutrophils Relative %: 74 %
Platelets: 217 10*3/uL (ref 150–400)
RBC: 3.73 MIL/uL — ABNORMAL LOW (ref 3.87–5.11)
RDW: 14.3 % (ref 11.5–15.5)
WBC: 8.4 10*3/uL (ref 4.0–10.5)
nRBC: 0 % (ref 0.0–0.2)

## 2023-05-09 LAB — BASIC METABOLIC PANEL WITH GFR
Anion gap: 7 (ref 5–15)
BUN: 30 mg/dL — ABNORMAL HIGH (ref 8–23)
CO2: 27 mmol/L (ref 22–32)
Calcium: 8.5 mg/dL — ABNORMAL LOW (ref 8.9–10.3)
Chloride: 99 mmol/L (ref 98–111)
Creatinine, Ser: 1.06 mg/dL — ABNORMAL HIGH (ref 0.44–1.00)
GFR, Estimated: 51 mL/min — ABNORMAL LOW (ref >60)
Glucose, Bld: 135 mg/dL — ABNORMAL HIGH (ref 70–99)
Potassium: 4.1 mmol/L (ref 3.5–5.1)
Sodium: 133 mmol/L — ABNORMAL LOW (ref 135–145)

## 2023-05-09 SURGERY — FIXATION, FRACTURE, INTERTROCHANTERIC, WITH INTRAMEDULLARY ROD
Anesthesia: General | Site: Hip | Laterality: Left

## 2023-05-09 MED ORDER — DIPHENHYDRAMINE HCL 12.5 MG/5ML PO ELIX: 12.5000 mg | ORAL_SOLUTION | ORAL | Status: DC | PRN

## 2023-05-09 MED ORDER — BISACODYL 10 MG RE SUPP: 10.0000 mg | Freq: Every day | RECTAL | Status: DC | PRN

## 2023-05-09 MED ORDER — ONDANSETRON HCL 4 MG/2ML IJ SOLN: 4.0000 mg | Freq: Once | INTRAMUSCULAR | Status: DC | PRN

## 2023-05-09 MED ORDER — PROPOFOL 10 MG/ML IV BOLUS
INTRAVENOUS | Status: AC
  Filled 2023-05-09: qty 20

## 2023-05-09 MED ORDER — CLOPIDOGREL BISULFATE 75 MG PO TABS
75.0000 mg | ORAL_TABLET | Freq: Every evening | ORAL | Status: DC
Start: 2023-05-10 — End: 2023-05-13
  Administered 2023-05-10 – 2023-05-12 (×3): 75 mg via ORAL
  Filled 2023-05-09 (×3): qty 1

## 2023-05-09 MED ORDER — BUPIVACAINE-EPINEPHRINE (PF) 0.25% -1:200000 IJ SOLN
INTRAMUSCULAR | Status: AC
  Filled 2023-05-09: qty 30

## 2023-05-09 MED ORDER — FENTANYL CITRATE (PF) 100 MCG/2ML IJ SOLN
INTRAMUSCULAR | Status: AC
  Filled 2023-05-09: qty 2

## 2023-05-09 MED ORDER — CEFAZOLIN SODIUM-DEXTROSE 2-4 GM/100ML-% IV SOLN
2.0000 g | Freq: Four times a day (QID) | INTRAVENOUS | Status: AC
  Administered 2023-05-09 (×2): 2 g via INTRAVENOUS
  Filled 2023-05-09 (×2): qty 100

## 2023-05-09 MED ORDER — PHENYLEPHRINE 80 MCG/ML (10ML) SYRINGE FOR IV PUSH (FOR BLOOD PRESSURE SUPPORT)
PREFILLED_SYRINGE | INTRAVENOUS | Status: DC | PRN
  Administered 2023-05-09 (×4): 120 ug via INTRAVENOUS

## 2023-05-09 MED ORDER — POLYETHYLENE GLYCOL 3350 17 G PO PACK
17.0000 g | PACK | Freq: Two times a day (BID) | ORAL | Status: DC
  Administered 2023-05-09 – 2023-05-12 (×7): 17 g via ORAL
  Filled 2023-05-09 (×7): qty 1

## 2023-05-09 MED ORDER — CEFAZOLIN SODIUM-DEXTROSE 2-4 GM/100ML-% IV SOLN
2.0000 g | INTRAVENOUS | Status: AC
  Administered 2023-05-09: 2 g via INTRAVENOUS
  Filled 2023-05-09: qty 100

## 2023-05-09 MED ORDER — ACETAMINOPHEN 500 MG PO TABS
1000.0000 mg | ORAL_TABLET | Freq: Four times a day (QID) | ORAL | Status: DC
  Administered 2023-05-09 – 2023-05-13 (×15): 1000 mg via ORAL
  Filled 2023-05-09 (×17): qty 2

## 2023-05-09 MED ORDER — MENTHOL 3 MG MT LOZG: 1.0000 | LOZENGE | OROMUCOSAL | Status: DC | PRN

## 2023-05-09 MED ORDER — CHLORHEXIDINE GLUCONATE 4 % EX SOLN: 60.0000 mL | Freq: Once | CUTANEOUS | Status: DC

## 2023-05-09 MED ORDER — SODIUM CHLORIDE 0.9% FLUSH
3.0000 mL | Freq: Two times a day (BID) | INTRAVENOUS | Status: DC
  Administered 2023-05-09 – 2023-05-10 (×2): 10 mL via INTRAVENOUS
  Administered 2023-05-11: 3 mL via INTRAVENOUS
  Administered 2023-05-11: 10 mL via INTRAVENOUS
  Administered 2023-05-12: 3 mL via INTRAVENOUS
  Administered 2023-05-12: 10 mL via INTRAVENOUS

## 2023-05-09 MED ORDER — PANTOPRAZOLE SODIUM 40 MG IV SOLR
40.0000 mg | Freq: Every day | INTRAVENOUS | Status: DC
  Administered 2023-05-09 – 2023-05-10 (×2): 40 mg via INTRAVENOUS
  Filled 2023-05-09 (×2): qty 10

## 2023-05-09 MED ORDER — DEXAMETHASONE SODIUM PHOSPHATE 10 MG/ML IJ SOLN
10.0000 mg | Freq: Once | INTRAMUSCULAR | Status: AC
  Administered 2023-05-10: 10 mg via INTRAVENOUS
  Filled 2023-05-09: qty 1

## 2023-05-09 MED ORDER — POVIDONE-IODINE 10 % EX SWAB
2.0000 | Freq: Once | CUTANEOUS | Status: AC
  Administered 2023-05-09: 2 via TOPICAL

## 2023-05-09 MED ORDER — PHENOL 1.4 % MT LIQD: 1.0000 | OROMUCOSAL | Status: DC | PRN

## 2023-05-09 MED ORDER — METHOCARBAMOL 1000 MG/10ML IJ SOLN: 500.0000 mg | Freq: Four times a day (QID) | INTRAMUSCULAR | Status: DC | PRN

## 2023-05-09 MED ORDER — OXYCODONE HCL 5 MG PO TABS
5.0000 mg | ORAL_TABLET | ORAL | Status: DC | PRN
  Administered 2023-05-10 – 2023-05-11 (×5): 5 mg via ORAL
  Administered 2023-05-12: 10 mg via ORAL
  Filled 2023-05-09 (×5): qty 1
  Filled 2023-05-09: qty 2

## 2023-05-09 MED ORDER — ALUM & MAG HYDROXIDE-SIMETH 200-200-20 MG/5ML PO SUSP
30.0000 mL | ORAL | Status: DC | PRN
  Administered 2023-05-12: 30 mL via ORAL
  Filled 2023-05-09: qty 30

## 2023-05-09 MED ORDER — TRANEXAMIC ACID-NACL 1000-0.7 MG/100ML-% IV SOLN
1000.0000 mg | Freq: Once | INTRAVENOUS | Status: AC
  Administered 2023-05-09: 1000 mg via INTRAVENOUS
  Filled 2023-05-09: qty 100

## 2023-05-09 MED ORDER — LIDOCAINE HCL (PF) 2 % IJ SOLN
INTRAMUSCULAR | Status: AC
  Filled 2023-05-09: qty 5

## 2023-05-09 MED ORDER — ONDANSETRON HCL 4 MG/2ML IJ SOLN: 4.0000 mg | Freq: Four times a day (QID) | INTRAMUSCULAR | Status: DC | PRN

## 2023-05-09 MED ORDER — 0.9 % SODIUM CHLORIDE (POUR BTL) OPTIME
TOPICAL | Status: DC | PRN
Start: 2023-05-09 — End: 2023-05-09
  Administered 2023-05-09: 1000 mL

## 2023-05-09 MED ORDER — HYDROMORPHONE HCL 1 MG/ML IJ SOLN
0.5000 mg | INTRAMUSCULAR | Status: DC | PRN
  Administered 2023-05-09 (×2): 0.5 mg via INTRAVENOUS
  Administered 2023-05-10 (×2): 1 mg via INTRAVENOUS
  Filled 2023-05-09 (×4): qty 1

## 2023-05-09 MED ORDER — CHLORHEXIDINE GLUCONATE 0.12 % MT SOLN
15.0000 mL | Freq: Once | OROMUCOSAL | Status: AC
  Administered 2023-05-09: 15 mL via OROMUCOSAL

## 2023-05-09 MED ORDER — STERILE WATER FOR IRRIGATION IR SOLN
Status: DC | PRN
  Administered 2023-05-09: 1000 mL

## 2023-05-09 MED ORDER — TRANEXAMIC ACID-NACL 1000-0.7 MG/100ML-% IV SOLN
1000.0000 mg | INTRAVENOUS | Status: AC
  Administered 2023-05-09: 1000 mg via INTRAVENOUS
  Filled 2023-05-09: qty 100

## 2023-05-09 MED ORDER — SODIUM CHLORIDE 0.9% FLUSH: 3.0000 mL | INTRAVENOUS | Status: DC | PRN

## 2023-05-09 MED ORDER — LACTATED RINGERS IV SOLN: INTRAVENOUS | Status: DC

## 2023-05-09 MED ORDER — METHOCARBAMOL 500 MG PO TABS
500.0000 mg | ORAL_TABLET | Freq: Four times a day (QID) | ORAL | Status: DC | PRN
  Administered 2023-05-11 – 2023-05-12 (×3): 500 mg via ORAL
  Filled 2023-05-09 (×3): qty 1

## 2023-05-09 MED ORDER — SENNA 8.6 MG PO TABS
2.0000 | ORAL_TABLET | Freq: Every day | ORAL | Status: DC
  Administered 2023-05-09 – 2023-05-12 (×4): 17.2 mg via ORAL
  Filled 2023-05-09 (×4): qty 2

## 2023-05-09 MED ORDER — LIDOCAINE 2% (20 MG/ML) 5 ML SYRINGE
INTRAMUSCULAR | Status: DC | PRN
  Administered 2023-05-09: 60 mg via INTRAVENOUS

## 2023-05-09 MED ORDER — ONDANSETRON HCL 4 MG/2ML IJ SOLN
INTRAMUSCULAR | Status: DC | PRN
  Administered 2023-05-09: 4 mg via INTRAVENOUS

## 2023-05-09 MED ORDER — DEXAMETHASONE SODIUM PHOSPHATE 10 MG/ML IJ SOLN
INTRAMUSCULAR | Status: DC | PRN
Start: 2023-05-09 — End: 2023-05-09
  Administered 2023-05-09: 5 mg via INTRAVENOUS

## 2023-05-09 MED ORDER — FENTANYL CITRATE PF 50 MCG/ML IJ SOSY: 25.0000 ug | PREFILLED_SYRINGE | INTRAMUSCULAR | Status: DC | PRN

## 2023-05-09 MED ORDER — SUGAMMADEX SODIUM 200 MG/2ML IV SOLN
INTRAVENOUS | Status: DC | PRN
  Administered 2023-05-09: 200 mg via INTRAVENOUS

## 2023-05-09 MED ORDER — DEXAMETHASONE SODIUM PHOSPHATE 10 MG/ML IJ SOLN
INTRAMUSCULAR | Status: AC
  Filled 2023-05-09: qty 1

## 2023-05-09 MED ORDER — METOCLOPRAMIDE HCL 5 MG PO TABS: 5.0000 mg | ORAL_TABLET | Freq: Three times a day (TID) | ORAL | Status: DC | PRN

## 2023-05-09 MED ORDER — FENTANYL CITRATE (PF) 100 MCG/2ML IJ SOLN
INTRAMUSCULAR | Status: DC | PRN
  Administered 2023-05-09 (×2): 50 ug via INTRAVENOUS

## 2023-05-09 MED ORDER — ONDANSETRON HCL 4 MG PO TABS: 4.0000 mg | ORAL_TABLET | Freq: Four times a day (QID) | ORAL | Status: DC | PRN

## 2023-05-09 MED ORDER — ROCURONIUM BROMIDE 10 MG/ML (PF) SYRINGE
PREFILLED_SYRINGE | INTRAVENOUS | Status: AC
  Filled 2023-05-09: qty 10

## 2023-05-09 MED ORDER — FENTANYL CITRATE PF 50 MCG/ML IJ SOSY
PREFILLED_SYRINGE | INTRAMUSCULAR | Status: AC
  Administered 2023-05-09: 25 ug via INTRAVENOUS
  Filled 2023-05-09: qty 1

## 2023-05-09 MED ORDER — OXYCODONE HCL 5 MG PO TABS
2.5000 mg | ORAL_TABLET | ORAL | Status: DC | PRN
  Administered 2023-05-09 – 2023-05-12 (×8): 5 mg via ORAL
  Filled 2023-05-09 (×9): qty 1

## 2023-05-09 MED ORDER — ROCURONIUM BROMIDE 10 MG/ML (PF) SYRINGE
PREFILLED_SYRINGE | INTRAVENOUS | Status: DC | PRN
Start: 2023-05-09 — End: 2023-05-09
  Administered 2023-05-09: 50 mg via INTRAVENOUS

## 2023-05-09 MED ORDER — METOCLOPRAMIDE HCL 5 MG/ML IJ SOLN: 5.0000 mg | Freq: Three times a day (TID) | INTRAMUSCULAR | Status: DC | PRN

## 2023-05-09 MED ORDER — FENTANYL CITRATE PF 50 MCG/ML IJ SOSY
25.0000 ug | PREFILLED_SYRINGE | INTRAMUSCULAR | Status: DC | PRN
  Administered 2023-05-09: 25 ug via INTRAVENOUS

## 2023-05-09 MED ORDER — PROPOFOL 10 MG/ML IV BOLUS
INTRAVENOUS | Status: DC | PRN
  Administered 2023-05-09: 70 mg via INTRAVENOUS

## 2023-05-09 MED ORDER — ONDANSETRON HCL 4 MG/2ML IJ SOLN
INTRAMUSCULAR | Status: AC
  Filled 2023-05-09: qty 2

## 2023-05-09 SURGICAL SUPPLY — 35 items
BAG COUNTER SPONGE SURGICOUNT (BAG) IMPLANT
BAG ZIPLOCK 12X15 (MISCELLANEOUS) ×1 IMPLANT
BIT DRILL CANN LG 4.3MM (BIT) IMPLANT
COVER PERINEAL POST (MISCELLANEOUS) ×1 IMPLANT
COVER SURGICAL LIGHT HANDLE (MISCELLANEOUS) ×1 IMPLANT
DERMABOND ADVANCED .7 DNX12 (GAUZE/BANDAGES/DRESSINGS) ×1 IMPLANT
DRAPE STERI IOBAN 125X83 (DRAPES) ×1 IMPLANT
DRESSING AQUACEL AG SP 3.5X10 (GAUZE/BANDAGES/DRESSINGS) IMPLANT
DRESSING AQUACEL AG SP 3.5X4 (GAUZE/BANDAGES/DRESSINGS) IMPLANT
DRSG AQUACEL AG ADV 3.5X 6 (GAUZE/BANDAGES/DRESSINGS) ×2 IMPLANT
DURAPREP 26ML APPLICATOR (WOUND CARE) ×1 IMPLANT
ELECT REM PT RETURN 15FT ADLT (MISCELLANEOUS) IMPLANT
FACESHIELD WRAPAROUND (MASK) ×1 IMPLANT
FACESHIELD WRAPAROUND OR TEAM (MASK) ×1 IMPLANT
GLOVE BIO SURGEON STRL SZ 6 (GLOVE) ×1 IMPLANT
GLOVE BIOGEL PI IND STRL 6.5 (GLOVE) ×1 IMPLANT
GLOVE BIOGEL PI IND STRL 7.5 (GLOVE) ×2 IMPLANT
GLOVE ORTHO TXT STRL SZ7.5 (GLOVE) ×1 IMPLANT
GOWN STRL REUS W/ TWL LRG LVL3 (GOWN DISPOSABLE) ×2 IMPLANT
GUIDEPIN VERSANAIL DSP 3.2X444 (ORTHOPEDIC DISPOSABLE SUPPLIES) IMPLANT
GUIDEWIRE BALL NOSE 100CM (WIRE) IMPLANT
KIT BASIN OR (CUSTOM PROCEDURE TRAY) ×1 IMPLANT
KIT TURNOVER KIT A (KITS) IMPLANT
MANIFOLD NEPTUNE II (INSTRUMENTS) ×1 IMPLANT
NAIL HIP FRACT 130D 11X180 (Screw) IMPLANT
PACK GENERAL/GYN (CUSTOM PROCEDURE TRAY) ×1 IMPLANT
PENCIL SMOKE EVACUATOR (MISCELLANEOUS) ×1 IMPLANT
PROTECTOR NERVE ULNAR (MISCELLANEOUS) ×1 IMPLANT
SCREW BONE CORTICAL 5.0X32 (Screw) IMPLANT
SCREW LAG HIP FR NAIL 10.5X110 (Orthopedic Implant) IMPLANT
SUT MNCRL AB 4-0 PS2 18 (SUTURE) ×1 IMPLANT
SUT VIC AB 0 CT1 36 (SUTURE) ×1 IMPLANT
SUT VIC AB 1 CT1 27XBRD ANBCTR (SUTURE) ×1 IMPLANT
SUT VIC AB 2-0 CT1 TAPERPNT 27 (SUTURE) ×1 IMPLANT
TOWEL OR 17X26 10 PK STRL BLUE (TOWEL DISPOSABLE) ×1 IMPLANT

## 2023-05-09 NOTE — Progress Notes (Signed)
 Patient urinated 275 mL of amber, clear urine.

## 2023-05-09 NOTE — Progress Notes (Signed)
 Patient ID: [name] [name], female   DOB: 1934/05/09, 89 y.o.   MRN: [id number]  Left comminuted intertrochanteric hip fracture  To OR today for IM nail Orders placed Ready to proceed

## 2023-05-09 NOTE — Plan of Care (Signed)
   Problem: Activity: Goal: Risk for activity intolerance will decrease Outcome: Progressing   Problem: Nutrition: Goal: Adequate nutrition will be maintained Outcome: Progressing   Problem: Coping: Goal: Level of anxiety will decrease Outcome: Progressing   Problem: Pain Managment: Goal: General experience of comfort will improve and/or be controlled Outcome: Progressing

## 2023-05-09 NOTE — Anesthesia Preprocedure Evaluation (Signed)
 Anesthesia Evaluation  Patient identified by MRN, date of birth, ID band Patient awake    Reviewed: Allergy & Precautions, NPO status , Patient's Chart, lab work & pertinent test results, reviewed documented beta blocker date and time   History of Anesthesia Complications (+) PONV and history of anesthetic complications  Airway Mallampati: II  TM Distance: >3 FB Neck ROM: Full    Dental  (+) Dental Advisory Given, Lower Dentures, Upper Dentures   Pulmonary neg pulmonary ROS   Pulmonary exam normal breath sounds clear to auscultation       Cardiovascular hypertension, Pt. on home beta blockers and Pt. on medications + CAD and +CHF  Normal cardiovascular exam Rhythm:Regular Rate:Normal  Stress Test 05/20/21: Normal perfusion. LVEF 70% with normal wall motion. This is a low risk study. No change compared to prior study in 2017.   Neuro/Psych  Headaches PSYCHIATRIC DISORDERS Anxiety Depression     Neuromuscular disease CVA, Residual Symptoms    GI/Hepatic Neg liver ROS,GERD  Medicated,,  Endo/Other  Obesity   Renal/GU negative Renal ROS     Musculoskeletal  (+) Arthritis , Rheumatoid disorders,  LEFT INTERTROCH FRACTURE   Abdominal   Peds  Hematology  (+) Blood dyscrasia (Plavix ) Obesity    Anesthesia Other Findings Day of surgery medications reviewed with the patient.  Reproductive/Obstetrics                             Anesthesia Physical Anesthesia Plan  ASA: 3  Anesthesia Plan: General   Post-op Pain Management: Tylenol  PO (pre-op)*   Induction: Intravenous  PONV Risk Score and Plan: 4 or greater and Treatment may vary due to age or medical condition, Dexamethasone  and Ondansetron   Airway Management Planned: Oral ETT  Additional Equipment:   Intra-op Plan:   Post-operative Plan: Extubation in OR  Informed Consent: I have reviewed the patients History and Physical, chart,  labs and discussed the procedure including the risks, benefits and alternatives for the proposed anesthesia with the patient or authorized representative who has indicated his/her understanding and acceptance.     Dental advisory given  Plan Discussed with: CRNA  Anesthesia Plan Comments:         Anesthesia Quick Evaluation

## 2023-05-09 NOTE — Anesthesia Postprocedure Evaluation (Signed)
 Anesthesia Post Note  Patient: [name] [name]  Procedure(s) Performed: FIXATION, FRACTURE, INTERTROCHANTERIC, WITH INTRAMEDULLARY ROD (Left: Hip)     Patient location during evaluation: PACU Anesthesia Type: General Level of consciousness: awake and alert Pain management: pain level controlled Vital Signs Assessment: post-procedure vital signs reviewed and stable Respiratory status: spontaneous breathing, nonlabored ventilation and respiratory function stable Cardiovascular status: blood pressure returned to baseline and stable Postop Assessment: no apparent nausea or vomiting Anesthetic complications: no   No notable events documented.  Last Vitals:  Vitals:   05/09/23 1245 05/09/23 1306  BP: (!) 104/48 95/73  Pulse: 97 82  Resp: 16 18  Temp: 36.8 C   SpO2: 95%     Last Pain:  Vitals:   05/09/23 1333  TempSrc:   PainSc: 6                  Erin Havers

## 2023-05-09 NOTE — Discharge Instructions (Signed)

## 2023-05-09 NOTE — Transfer of Care (Signed)
 Immediate Anesthesia Transfer of Care Note  Patient: [name] [name]  Procedure(s) Performed: FIXATION, FRACTURE, INTERTROCHANTERIC, WITH INTRAMEDULLARY ROD (Left: Hip)  Patient Location: PACU  Anesthesia Type:General  Level of Consciousness: awake and patient cooperative  Airway & Oxygen  Therapy: Patient Spontanous Breathing and Patient connected to face mask  Post-op Assessment: Report given to RN and Post -op Vital signs reviewed and stable  Post vital signs: Reviewed and stable  Last Vitals:  Vitals Value Taken Time  BP 122/56 05/09/23 1123  Temp    Pulse 66 05/09/23 1125  Resp 19 05/09/23 1125  SpO2 81 % 05/09/23 1125  Vitals shown include unfiled device data.  Last Pain:  Vitals:   05/09/23 0847  TempSrc: Oral  PainSc: 10-Worst pain ever      Patients Stated Pain Goal: 0 (05/09/23 0847)  Complications: No notable events documented.

## 2023-05-09 NOTE — Interval H&P Note (Signed)
 History and Physical Interval Note:  05/09/2023 8:43 AM  [name] [name]  has presented today for surgery, with the diagnosis of LEFT INTERTROCH FRACTURE.  The various methods of treatment have been discussed with the patient and family. After consideration of risks, benefits and other options for treatment, the patient has consented to  Procedure(s): FIXATION, FRACTURE, INTERTROCHANTERIC, WITH INTRAMEDULLARY ROD (Left) as a surgical intervention.  The patient's history has been reviewed, patient examined, no change in status, stable for surgery.  I have reviewed the patient's chart and labs.  Questions were answered to the patient's satisfaction.     Bevin Bucks

## 2023-05-09 NOTE — Progress Notes (Addendum)
 PROGRESS NOTE  [name] [name]  DOB: 12-20-1934  PCP: Clemens Curt, MD ZOX:[id number]  DOA: 05/07/2023  LOS: 1 day  Hospital Day: 3  Brief narrative: [name] [name] is a 89 y.o. female with PMH significant for HTN, HLD, CAD, CHF, GERD, chronic pain, anxiety/depression 4/20, patient was brought to the ED after a mechanical fall landing on her left side, sustained severe left hip pain Imaging in the ED showed intertrochanteric femur fracture Orthopedics was consulted Admitted to [facility]  Subjective: Patient was seen and examined this afternoon. Underwent intramedullary rod placement for left femur fracture today. Pain controlled. Daughter at bedside. Afebrile, hemodynamically stable, on 2 L oxygen  Complain of heartburn this morning, IV Protonix  started Labs this morning with creatinine better at 1.06, sodium at 133  Assessment and plan: Left intertrochanteric femur fracture S/p IM rod  -4/22 Dr. Bernard Brick Fractures secondary to secondary to mechanical fall Imaging and procedure as above  Pain regimen: Scheduled Tylenol , as needed morphine , as needed oxycodone  Bowel regimen: Scheduled Senokot, as needed MiraLAX  DVT prophylaxis: Currently on SCDs. Plavix  on hold  Chronic pain Chronically on gabapentin  3 times daily. While on opioids, switched gabapentin  to nightly only  Hypokalemia Potassium level improved with replacement Recent Labs  Lab 05/07/23 1538 05/08/23 0347 05/09/23 0702  K 3.4* 4.4 4.1   Chronic diastolic CHF Hypertension Currently compensated CHF PTA meds-Toprol  25 mg daily, Lasix  20 mg daily Continue metoprolol .  Keep Lasix  on hold  CAD, HLD Continue metoprolol , Lipitor Plavix  on hold.  Will resume if okay with orthopedics tomorrow  GERD Pepcid   Anxiety/depression Zoloft , bupropion     Mobility: PT eval postprocedure  Goals of care   Code Status: Full Code     DVT prophylaxis:  SCDs Start: 05/09/23 1304 Place TED hose Start: 05/09/23  1304 SCDs Start: 05/07/23 1859   Antimicrobials: None Fluid: None Consultants: Orthopedics Family Communication: Daughter at bedside  Status: Observation Level of care:  Med-Surg   Patient is from: Home Needs to continue in-hospital care: POD 0 Anticipated d/c to: Home versus rehab      Diet:  Diet Order             Diet regular Room service appropriate? Yes; Fluid consistency: Thin  Diet effective now                   Scheduled Meds:  acetaminophen   1,000 mg Oral Q6H   atorvastatin   10 mg Oral QHS   buPROPion   300 mg Oral Daily   [START ON 05/10/2023] clopidogrel   75 mg Oral QPM   [START ON 05/10/2023] dexamethasone  (DECADRON ) injection  10 mg Intravenous Once   famotidine   40 mg Oral Daily   gabapentin   200 mg Oral QHS   metoprolol  succinate  12.5 mg Oral QHS   pantoprazole  (PROTONIX ) IV  40 mg Intravenous Daily   polyethylene glycol  17 g Oral BID   senna  2 tablet Oral QHS   sertraline   100 mg Oral BID   sodium chloride  flush  3-10 mL Intravenous Q12H    PRN meds: alum & mag hydroxide-simeth, bisacodyl , diphenhydrAMINE , HYDROmorphone  (DILAUDID ) injection, loratadine , menthol -cetylpyridinium **OR** phenol, methocarbamol  **OR** methocarbamol  (ROBAXIN ) injection, metoCLOPramide  **OR** metoCLOPramide  (REGLAN ) injection, ondansetron  **OR** ondansetron  (ZOFRAN ) IV, oxyCODONE , oxyCODONE , sodium chloride  flush   Infusions:    ceFAZolin  (ANCEF ) IV 2 g (05/09/23 1525)    Antimicrobials: Anti-infectives (From admission, onward)    Start     Dose/Rate Route Frequency Ordered Stop   05/09/23  1600  ceFAZolin  (ANCEF ) IVPB 2g/100 mL premix        2 g 200 mL/hr over 30 Minutes Intravenous Every 6 hours 05/09/23 1304 05/10/23 0359   05/09/23 0830  ceFAZolin  (ANCEF ) IVPB 2g/100 mL premix        2 g 200 mL/hr over 30 Minutes Intravenous On call to O.R. 05/09/23 0741 05/09/23 1016       Objective: Vitals:   05/09/23 1245 05/09/23 1306  BP: (!) 104/48 95/73   Pulse: 97 82  Resp: 16 18  Temp: 98.3 F (36.8 C)   SpO2: 95%     Intake/Output Summary (Last 24 hours) at 05/09/2023 1548 Last data filed at 05/09/2023 1458 Gross per 24 hour  Intake 2315.1 ml  Output 740 ml  Net 1575.1 ml   Filed Weights   05/07/23 2007  Weight: 82.1 kg   Weight change:  Body mass index is 31.07 kg/m.   Physical Exam: General exam: Pleasant, elderly Caucasian female.  Pain controlled Skin: No rashes, lesions or ulcers. HEENT: Atraumatic, normocephalic, no obvious bleeding Lungs: Clear to auscultation bilaterally,  CVS: S1, S2, no murmur,   GI/Abd: Soft, nontender, nondistended, bowel sound present,   CNS: Slightly drowsy but opens eyes on command.  Alert, oriented x 3 Psychiatry: Mood appropriate,  Extremities: No pedal edema, no calf tenderness,   Data Review: I have personally reviewed the laboratory data and studies available.  F/u labs  Unresulted Labs (From admission, onward)     Start     Ordered   05/10/23 0500  CBC  Daily,   R     Question:  Specimen collection method  Answer:  Lab=Lab collect   05/09/23 1304   05/10/23 0500  Basic metabolic panel  Tomorrow morning,   R       Question:  Specimen collection method  Answer:  Lab=Lab collect   05/09/23 1304           Total time spent in review of labs and imaging, patient evaluation, formulation of plan, documentation and communication with family: 45 minutes  Signed, Hoyt Macleod, MD [facility] 05/09/2023

## 2023-05-09 NOTE — Op Note (Unsigned)
 NAME: [name] [name], [name] P. MEDICAL RECORD NO: [id number] ACCOUNT NO: [id number] DATE OF BIRTH: Jul 11, 1934 FACILITY: [facility] LOCATION: [id number] PHYSICIAN: Azalea Lento. Bernard Brick, MD  Operative Report   DATE OF PROCEDURE: 05/09/2023  PREOPERATIVE DIAGNOSIS:  Comminuted peritrochanteric left hip fracture.  POSTOPERATIVE DIAGNOSIS: Comminuted peritrochanteric left hip fracture.  PROCEDURE:  Intramedullary nailing of left hip fracture.  COMPONENTS USED:  A Biomet Affixus nail 11 x 180 mm with a 130-degree lag screw and a distal interlock.  SURGEON:  Azalea Lento.  Bernard Brick, MD  ASSISTANT:  Kim Pen, PA-C.  Note that PA Cleotilde Dago was present for the entirety of the case from preoperative positioning, perioperative management of the operative case including maintenance of the reduction of the fracture while fixation.  Primary  wound closure and general facilitation of the case.  ANESTHESIA:  General.  BLOOD LOSS:  About 150 mL.  DRAINS:  None.  COMPLICATIONS:  None.  INDICATIONS FOR THE PROCEDURE:  The patient is an 89 year old female who had a ground-level fall from a stool.  She had comminuted left proximal femur fracture.  We reviewed the indications for fixation, fracture management, and functional capabilities.   Risks of malunion, nonunion, and need for future surgeries were all discussed and reviewed in addition to infection and DVT.  Consent was obtained for management of the fracture.  DESCRIPTION OF PROCEDURE:  The patient was brought to the operative theater.  Once adequate anesthesia, preoperative antibiotics, Ancef  administered as well as tranexamic acid , she was positioned supine.  All bony prominences were protected and padded.   Her right unaffected extremity was flexed and abducted and positioned on the well leg holder with her lateral aspect of the knee padded.  Once this was carefully addressed, traction and internal rotation was applied to her lower extremity against the  perineal  post.  Fluoroscopy was used to evaluate the fracture.  Her length appeared to be out to near anatomic position; however, there was significant displacement of her shaft of the femur in relationship to the neck and this peritrochanteric fracture.   At this point, the left hip was prepped and draped in a sterile fashion with a shower curtain technique.  A timeout was performed identifying the patient and planned procedure extremity.  Fluoroscopy was brought back to the field.  Landmarks were  identified at the tip of the trochanter.  An incision was made proximal to this.  The guidewire was then inserted into the tip of the trochanter.  I opened up the proximal femur with a drill.  I then used the starting awl to find and identify the shaft  of the femur.  Once this was identified and confirmed radiographically, I opened up a ball tip guidewire and passed it across the fracture site.  I then attempted to pass an 11 x 180 mm nail; however, it was tight in the isthmus of the femur.  For that  reason, we used reamers and reamed up to 12.5 mm.  Following this reamer, I was able to pass the nail by hand to its appropriate depth.  At this point, we evaluated the fracture fluoroscopically.  I used the Cobb elevator in order to apply posterior  pressure on the anterior aspect of the femoral neck in order to reduce it as best as possible to the intertrochanteric segment.  I then passed a guidewire to the center of the femoral head confirmed in both the AP and lateral planes.  Once this was done,  we measured and selected a  110 mm lag screw.  I drilled for this and then passed the screw.  The screw was placed into good subchondral bone.  Once this was done, we took off the gross traction of the femur and then used the compression roll and  medialized the shaft to the fracture site.  There was good fixation.  Based on the nature of this fracture pattern, I elected to use this as a fixed angle device.  I tightened down the  locking bolt proximally.  Once this was done, a distal interlock was  placed.  The jig was then removed.  Final radiographs were obtained.  I then irrigated the wounds.  The proximal gluteal fascia was reapproximated using #1 Vicryl.  The remainder of the wound was closed with 2-0 Vicryl and running Monocryl stitch.  The  wounds were all clean, dry, and dressed sterilely using surgical glue and Aquacel dressing.  Postoperatively, based on the nature of her fracture and osteoporosis, she will be partial weightbearing.  We will see her back in 2 weeks for a wound check and radiographic evaluation.   PUS D: 05/09/2023 11:08:31 am T: 05/09/2023 9:17:00 pm  JOB: [id number]/ [id number]

## 2023-05-09 NOTE — Brief Op Note (Signed)
 05/07/2023 - 05/09/2023  11:01 AM  PATIENT:  [name] [name]  89 y.o. female  PRE-OPERATIVE DIAGNOSIS:  left intertrochanteric hip fracture  POST-OPERATIVE DIAGNOSIS:  left intertrochanteric hip fracture  PROCEDURE:  Procedure(s): FIXATION, FRACTURE, INTERTROCHANTERIC, WITH INTRAMEDULLARY ROD (Left)  SURGEON:  Surgeons and Role:    Claiborne Crew, MD - Primary  PHYSICIAN ASSISTANT: Kim Pen, PA-C  ANESTHESIA:   general  EBL:  100 mL   BLOOD ADMINISTERED:none  DRAINS: none   LOCAL MEDICATIONS USED:  NONE  SPECIMEN:  No Specimen  DISPOSITION OF SPECIMEN:  N/A  COUNTS:  YES  TOURNIQUET:  * No tourniquets in log *  DICTATION: .Other Dictation: Dictation Number [id number]  PLAN OF CARE: Admit to inpatient   PATIENT DISPOSITION:  PACU - hemodynamically stable.   Delay start of Pharmacological VTE agent (>24hrs) due to surgical blood loss or risk of bleeding: no

## 2023-05-09 NOTE — Anesthesia Procedure Notes (Signed)
 Procedure Name: Intubation Date/Time: 05/09/2023 10:03 AM  Performed by: Rochell Chroman, CRNAPre-anesthesia Checklist: Patient identified, Emergency Drugs available, Suction available and Patient being monitored Patient Re-evaluated:Patient Re-evaluated prior to induction Oxygen  Delivery Method: Circle system utilized Preoxygenation: Pre-oxygenation with 100% oxygen  Induction Type: IV induction Ventilation: Mask ventilation without difficulty Laryngoscope Size: 2 and Miller Grade View: Grade I Tube type: Oral Tube size: 7.0 mm Number of attempts: 1 Airway Equipment and Method: Stylet Placement Confirmation: ETT inserted through vocal cords under direct vision, positive ETCO2 and breath sounds checked- equal and bilateral Secured at: 21 cm Tube secured with: Tape Dental Injury: Teeth and Oropharynx as per pre-operative assessment

## 2023-05-10 ENCOUNTER — Encounter (HOSPITAL_COMMUNITY): Payer: Self-pay | Admitting: Orthopedic Surgery

## 2023-05-10 DIAGNOSIS — S72002A Fracture of unspecified part of neck of left femur, initial encounter for closed fracture: Secondary | ICD-10-CM | POA: Diagnosis not present

## 2023-05-10 LAB — CBC
HCT: 26.9 % — ABNORMAL LOW (ref 36.0–46.0)
Hemoglobin: 8.5 g/dL — ABNORMAL LOW (ref 12.0–15.0)
MCH: 28.5 pg (ref 26.0–34.0)
MCHC: 31.6 g/dL (ref 30.0–36.0)
MCV: 90.3 fL (ref 80.0–100.0)
Platelets: 195 10*3/uL (ref 150–400)
RBC: 2.98 MIL/uL — ABNORMAL LOW (ref 3.87–5.11)
RDW: 14.3 % (ref 11.5–15.5)
WBC: 6 10*3/uL (ref 4.0–10.5)
nRBC: 0 % (ref 0.0–0.2)

## 2023-05-10 LAB — BASIC METABOLIC PANEL WITH GFR
Anion gap: 6 (ref 5–15)
BUN: 23 mg/dL (ref 8–23)
CO2: 27 mmol/L (ref 22–32)
Calcium: 8.6 mg/dL — ABNORMAL LOW (ref 8.9–10.3)
Chloride: 103 mmol/L (ref 98–111)
Creatinine, Ser: 0.71 mg/dL (ref 0.44–1.00)
GFR, Estimated: >60 mL/min (ref >60)
Glucose, Bld: 129 mg/dL — ABNORMAL HIGH (ref 70–99)
Potassium: 3.7 mmol/L (ref 3.5–5.1)
Sodium: 136 mmol/L (ref 135–145)

## 2023-05-10 MED ORDER — PANTOPRAZOLE SODIUM 40 MG PO TBEC: 40.0000 mg | DELAYED_RELEASE_TABLET | Freq: Every day | ORAL | Status: DC

## 2023-05-10 MED ORDER — OXYCODONE HCL 5 MG PO TABS: 5.0000 mg | ORAL_TABLET | ORAL | 0 refills | Status: DC | PRN

## 2023-05-10 MED ORDER — POLYETHYLENE GLYCOL 3350 17 G PO PACK: 17.0000 g | PACK | Freq: Two times a day (BID) | ORAL | 0 refills | Status: DC

## 2023-05-10 MED ORDER — METHOCARBAMOL 500 MG PO TABS: 500.0000 mg | ORAL_TABLET | Freq: Four times a day (QID) | ORAL | 2 refills | Status: DC | PRN

## 2023-05-10 MED ORDER — PANTOPRAZOLE SODIUM 40 MG PO TBEC
40.0000 mg | DELAYED_RELEASE_TABLET | Freq: Every day | ORAL | Status: DC
  Administered 2023-05-11 – 2023-05-13 (×3): 40 mg via ORAL
  Filled 2023-05-10 (×3): qty 1

## 2023-05-10 NOTE — TOC Initial Note (Signed)
 Transition of Care [facility]) - Initial/Assessment Note   Patient Details  Name: [name] [name] MRN: [id number] Date of Birth: 03-Feb-1934  Transition of Care [facility]) CM/SW Contact:    Zenon Hilda, LCSW Phone Number: 05/10/2023, 2:06 PM  Clinical Narrative: Patient is from home with family. Patient had surgery yesterday for a hip fracture. PT consulted. TOC awaiting recommendations.  Expected Discharge Plan:  (TBD) Barriers to Discharge: Continued Medical Work up  Expected Discharge Plan and Services In-house Referral: Clinical Social Work Living arrangements for the past 2 months: Single Family Home  Prior Living Arrangements/Services Living arrangements for the past 2 months: Single Family Home Lives with:: Adult Children Patient language and need for interpreter reviewed:: Yes Do you feel safe going back to the place where you live?: Yes      Need for Family Participation in Patient Care: No (Comment) Care giver support system in place?: Yes (comment) Criminal Activity/Legal Involvement Pertinent to Current Situation/Hospitalization: No - Comment as needed  Activities of Daily Living ADL Screening (condition at time of admission) Independently performs ADLs?: Yes (appropriate for developmental age) Is the patient deaf or have difficulty hearing?: Yes Does the patient have difficulty seeing, even when wearing glasses/contacts?: No Does the patient have difficulty concentrating, remembering, or making decisions?: No  Emotional Assessment Orientation: : Oriented to Self, Oriented to Place, Oriented to  Time, Oriented to Situation Alcohol / Substance Use: Not Applicable Psych Involvement: No (comment)  Admission diagnosis:  Hip fracture (HCC) [S72.009A] Closed displaced intertrochanteric fracture of left femur, initial encounter (HCC) [W10.272Z] Patient Active Problem List   Diagnosis Date Noted   Hip fracture (HCC) 05/07/2023   Acute bronchitis 02/28/2023   Arthritis of  both hands 08/15/2022   Cerumen impaction 06/24/2022   Right shoulder pain 12/28/2021   Esophageal dysphagia 12/06/2021   Current use of proton pump inhibitor 05/12/2021   Precordial chest pain 05/07/2021   Aortic atherosclerosis (HCC) 05/07/2021   Coronary artery calcification seen on CT scan 05/07/2021   Myalgia 12/09/2019   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 12/09/2019   Chronic tension-type headache, not intractable 09/06/2019   Ageusia 09/06/2019   Anosmia 09/06/2019   Trigeminal neuralgia 03/15/2019   Mobility impaired 12/11/2018   Poor balance 08/13/2018   Memory loss 06/27/2018   Physical deconditioning 03/19/2018   Chronic heart failure with preserved ejection fraction (HCC) 02/09/2018   Prediabetes 01/26/2017   Hearing loss 01/19/2016   Carpal tunnel syndrome 08/31/2015   Encounter for Medicare annual wellness exam 06/04/2014   Obesity 06/04/2014   Estrogen deficiency 06/04/2014   Schatzki's ring 03/27/2014   OA (osteoarthritis) of knee 08/16/2011   Hemorrhoids 08/10/2011   Other dyspnea and respiratory abnormality 06/21/2010   Mixed incontinence 05/27/2010   OVERACTIVE BLADDER 03/12/2010   Lumbar degenerative disc disease 04/13/2009   Chronic GERD 10/15/2008   History of cardiovascular disorder 10/15/2008   Arthropathy, multiple sites 06/26/2008   External hemorrhoids 02/01/2008   Diaphragmatic hernia 02/01/2008   Diverticulosis of colon 02/01/2008   GASTRITIS, HX OF 02/01/2008   DYSPNEA 09/19/2007   Vitamin D  deficiency 07/03/2007   Osteoporosis of lumbar spine 06/13/2007   Fatigue 06/13/2007   History of Helicobacter pylori infection 06/06/2006   Hyperlipidemia 06/06/2006   Generalized anxiety disorder 06/06/2006   Adjustment disorder with mixed anxiety and depressed mood 06/06/2006   Macular degeneration (senile) of retina 06/06/2006   H/O: CVA (cerebrovascular accident) 06/06/2006   Osteoarthritis 06/06/2006   PCP:  Clemens Curt,  MD  Pharmacy:   [id number] [place] Nest[place], [place] - [address] AT [facility] [address] & [facility] [address] [place] [address] Phone: [phone] Fax: [phone]  Social Drivers of Health (SDOH) Social History: SDOH Screenings   Food Insecurity: No Food Insecurity (05/07/2023)  Housing: Low Risk  (05/07/2023)  Transportation Needs: No Transportation Needs (05/07/2023)  Utilities: Not At Risk (05/07/2023)  Alcohol Screen: Low Risk  (05/17/2022)  Depression (PHQ2-9): High Risk (08/15/2022)  Financial Resource Strain: Low Risk  (05/17/2022)  Physical Activity: Inactive (05/17/2022)  Social Connections: Patient Declined (05/08/2023)  Stress: Stress Concern Present (05/17/2022)  Tobacco Use: Low Risk  (05/09/2023)   SDOH Interventions:    Readmission Risk Interventions     No data to display

## 2023-05-10 NOTE — Progress Notes (Signed)
 Subjective: 1 Day Post-Op Procedure(s) (LRB): FIXATION, FRACTURE, INTERTROCHANTERIC, WITH INTRAMEDULLARY ROD (Left) Patient reports pain as mild.   Patient seen in rounds with Dr. Bernard Brick. Patient is resting in bed on exam this morning. No acute events overnight. Family at the bedside.  We will start therapy today.   Objective: Vital signs in last 24 hours: Temp:  [97.9 F (36.6 C)-99.6 F (37.6 C)] 98.3 F (36.8 C) (04/23 0409) Pulse Rate:  [66-98] 67 (04/23 0409) Resp:  [15-22] 17 (04/23 0409) BP: (87-153)/(48-136) 105/49 (04/23 0409) SpO2:  [93 %-100 %] 99 % (04/23 0409)  Intake/Output from previous day:  Intake/Output Summary (Last 24 hours) at 05/10/2023 0801 Last data filed at 05/10/2023 0600 Gross per 24 hour  Intake 1460 ml  Output 650 ml  Net 810 ml     Intake/Output this shift: No intake/output data recorded.  Labs: Recent Labs    05/07/23 1538 05/08/23 0347 05/09/23 0317 05/10/23 0329  HGB 12.0 11.6* 10.5* 8.5*   Recent Labs    05/09/23 0317 05/10/23 0329  WBC 8.4 6.0  RBC 3.73* 2.98*  HCT 34.4* 26.9*  PLT 217 195   Recent Labs    05/09/23 0702 05/10/23 0329  NA 133* 136  K 4.1 3.7  CL 99 103  CO2 27 27  BUN 30* 23  CREATININE 1.06* 0.71  GLUCOSE 135* 129*  CALCIUM  8.5* 8.6*   Recent Labs    05/07/23 1538  INR 1.0    Exam: General - Patient is Alert and Oriented Extremity - Neurologically intact Sensation intact distally Intact pulses distally Dorsiflexion/Plantar flexion intact Dressing - dressing C/D/I Motor Function - intact, moving foot and toes well on exam.   Past Medical History:  Diagnosis Date   Alternating constipation and diarrhea    Anxiety state, unspecified    CHF (congestive heart failure) (HCC)    Complication of anesthesia    slow to wake up with surgery several years ago    COVID-19 virus infection 12/2018   Depressive disorder, not elsewhere classified    Diaphragmatic hernia without mention of  obstruction or gangrene    Difficulty sleeping    Diverticulosis of colon (without mention of hemorrhage)    Esophageal reflux    External hemorrhoids without mention of complication    Family history of adverse reaction to anesthesia    Dad very slow to wake upi   Fatigue    Frequency of urination    Gallstones    Headache    Helicobacter pylori (H. pylori)    Hiatal hernia    History of nuclear stress test    Myoview  05/2021: EF 70, normal perfusion; low risk   History of transfusion    Lumbar spondylosis    Macular degeneration    Macular degeneration (senile) of retina, unspecified    Osteoarthrosis, unspecified whether generalized or localized, unspecified site 08/11/2011   hx. "rhematoid arthritis", osteoarthritis, DDD, bursitis(hip)   Osteoporosis    Palpitations    PONV (postoperative nausea and vomiting)    Schatzki's ring    Shortness of breath 08/11/2011   with exertion only at present   Unspecified cerebral artery occlusion with cerebral infarction 08/11/2011   '97/ '06( TIA)-affected lt. side, slight weakness L side   Unspecified vitamin D  deficiency     Assessment/Plan: 1 Day Post-Op Procedure(s) (LRB): FIXATION, FRACTURE, INTERTROCHANTERIC, WITH INTRAMEDULLARY ROD (Left) Principal Problem:   Hip fracture (HCC)  Estimated body mass index is 31.07 kg/m as calculated from the  following:   Height as of this encounter: 5\' 4"  (1.626 m).   Weight as of this encounter: 82.1 kg. Advance diet Up with therapy  DVT Prophylaxis - Plavix   PWB 50% LLE with a walker   Discussed overall expectations and plan with patient today Will maintain PWB for at least 6 weeks  Up with PT today to practice Unless she makes good progress may need to look for SNF bed Very supportive family     Kim Pen, [place] [facility] [phone] 05/10/2023, 8:01 AM

## 2023-05-10 NOTE — Progress Notes (Signed)
 PROGRESS NOTE  [name] [name]  DOB: [birth year]  PCP: Clemens Curt, MD ZOX:[id number]  DOA: 05/07/2023  LOS: 2 days  Hospital Day: 4  Brief narrative: [name] is a 89 y.o. female with PMH significant for HTN, HLD, CAD, CHF, GERD, chronic pain, anxiety/depression 4/20, patient was brought to the ED after a mechanical fall landing on her left side, sustained severe left hip pain Imaging in the ED showed intertrochanteric femur fracture Orthopedics was consulted Admitted to [facility] 4/22 -underwent intramedullary rod placement in left femur by Dr. Bernard Brick  Subjective: Patient was seen and examined this morning. Reports a lot of pain last night because she had to be cleaned multiple times because of malfunctioning pure wick. Daughter at bedside this morning Labs this morning with hemoglobin down to 8.5  Assessment and plan: Left intertrochanteric femur fracture S/p IM rod  -4/22 Dr. Bernard Brick Fractures secondary to secondary to mechanical fall Imaging and procedure as above  Pain regimen: Scheduled Tylenol , as needed morphine , as needed oxycodone  Bowel regimen: Scheduled Senokot, as needed MiraLAX  DVT prophylaxis: Currently on SCDs. Plavix  has been resumed. Per orthopedics, patient will maintain partial weightbearing for at least 6 weeks.  Chronic pain Was chronically on gabapentin  3 times daily. While on opioids, currently on gabapentin  nightly only  Acute postop blood loss anemia Hemoglobin down to 8.5 this morning.  Secondary to blood loss from fracture and surgery. No bleeding elsewhere. Repeat labs tomorrow.  Transfuse if less than 7.  Type and screen ordered as well. Recent Labs    06/29/22 1118 05/07/23 1538 05/07/23 1538 05/08/23 0347 05/09/23 0317 05/10/23 0329  HGB 12.7 12.0  --  11.6* 10.5* 8.5*  MCV 88 88.5   < > 91.2 92.2 90.3  VITAMINB12 530  --   --   --   --   --    < > = values in this interval not displayed.   Hypokalemia Potassium level improved  with replacement Recent Labs  Lab 05/07/23 1538 05/08/23 0347 05/09/23 0702 05/10/23 0329  K 3.4* 4.4 4.1 3.7   Chronic diastolic CHF Hypertension Currently compensated CHF PTA meds-Toprol  25 mg daily, Lasix  20 mg daily Continue metoprolol .  Continue to hold Lasix .    CAD, HLD Continue metoprolol , Plavix , Lipitor  GERD Hiatal hernia PPI, Pepcid   Anxiety/depression Zoloft , bupropion     Mobility: PT eval postprocedure  Goals of care   Code Status: Full Code     DVT prophylaxis:  SCDs Start: 05/09/23 1304 Place TED hose Start: 05/09/23 1304 SCDs Start: 05/07/23 1859   Antimicrobials: None Fluid: None Consultants: Orthopedics Family Communication: Daughter at bedside  Status: Observation Level of care:  Med-Surg   Patient is from: Home Needs to continue in-hospital care: POD 1, pending PT eval. Anticipated d/c to: Likely needs SNF      Diet:  Diet Order             Diet regular Room service appropriate? Yes; Fluid consistency: Thin  Diet effective now                   Scheduled Meds:  acetaminophen   1,000 mg Oral Q6H   atorvastatin   10 mg Oral QHS   buPROPion   300 mg Oral Daily   clopidogrel   75 mg Oral QPM   famotidine   40 mg Oral Daily   gabapentin   200 mg Oral QHS   metoprolol  succinate  12.5 mg Oral QHS   [START ON 05/11/2023] pantoprazole   40 mg  Oral Daily   polyethylene glycol  17 g Oral BID   senna  2 tablet Oral QHS   sertraline   100 mg Oral BID   sodium chloride  flush  3-10 mL Intravenous Q12H    PRN meds: alum & mag hydroxide-simeth, bisacodyl , diphenhydrAMINE , HYDROmorphone  (DILAUDID ) injection, loratadine , menthol -cetylpyridinium **OR** phenol, methocarbamol  **OR** methocarbamol  (ROBAXIN ) injection, metoCLOPramide  **OR** metoCLOPramide  (REGLAN ) injection, ondansetron  **OR** ondansetron  (ZOFRAN ) IV, oxyCODONE , oxyCODONE , sodium chloride  flush   Infusions:     Antimicrobials: Anti-infectives (From admission, onward)     Start     Dose/Rate Route Frequency Ordered Stop   05/09/23 1600  ceFAZolin  (ANCEF ) IVPB 2g/100 mL premix        2 g 200 mL/hr over 30 Minutes Intravenous Every 6 hours 05/09/23 1304 05/09/23 2155   05/09/23 0830  ceFAZolin  (ANCEF ) IVPB 2g/100 mL premix        2 g 200 mL/hr over 30 Minutes Intravenous On call to O.R. 05/09/23 0741 05/09/23 1016       Objective: Vitals:   05/10/23 0225 05/10/23 0409  BP: (!) 103/49 (!) 105/49  Pulse: 70 67  Resp: 16 17  Temp:  98.3 F (36.8 C)  SpO2: 100% 99%    Intake/Output Summary (Last 24 hours) at 05/10/2023 1132 Last data filed at 05/10/2023 0854 Gross per 24 hour  Intake 1060 ml  Output 350 ml  Net 710 ml   Filed Weights   05/07/23 2007  Weight: 82.1 kg   Weight change:  Body mass index is 31.07 kg/m.   Physical Exam: General exam: Pleasant, elderly Caucasian female.  Pain controlled this morning.  Had a rough last night Skin: No rashes, lesions or ulcers. HEENT: Atraumatic, normocephalic, no obvious bleeding Lungs: Clear to auscultation bilaterally,  CVS: S1, S2, no murmur,   GI/Abd: Soft, nontender, nondistended, bowel sound present,   CNS: Alert, awake, oriented x 3. Psychiatry: Mood appropriate,  Extremities: No pedal edema, no calf tenderness,   Data Review: I have personally reviewed the laboratory data and studies available.  F/u labs  Unresulted Labs (From admission, onward)     Start     Ordered   05/10/23 0500  CBC  Daily,   R     Question:  Specimen collection method  Answer:  Lab=Lab collect   05/09/23 1304           Total time spent in review of labs and imaging, patient evaluation, formulation of plan, documentation and communication with family: 45 minutes  Signed, Hoyt Macleod, MD [facility] 05/10/2023

## 2023-05-10 NOTE — Evaluation (Signed)
 Physical Therapy Evaluation Patient Details Name: [name] [name] MRN: [id number] DOB: 02-Mar-1934 Today's Date: 05/10/2023  History of Present Illness  89 y.o. female  PMH significant for HTN, HLD, CAD, CHF, GERD, chronic pain, anxiety/depression  4/20, patient was brought to the ED after a mechanical fall landing on her left side, sustained severe left hip pain  Imaging in the ED showed intertrochanteric femur fracture  Orthopedics was consulted. pt is s/p IM  nail on 05/09/23  Clinical Impression  Pt admitted with above diagnosis.  Pt reports mod independent amb at home using rollator. Pt min to mod assist of 2 for bed mobility and transfers, requiring multi-modal cues and incr time throughout session.  Patient will benefit from continued inpatient follow up therapy, <3 hours/day   Pt currently with functional limitations due to the deficits listed below (see PT Problem List). Pt will benefit from acute skilled PT to increase their independence and safety with mobility to allow discharge.           If plan is discharge home, recommend the following: A lot of help with walking and/or transfers;A lot of help with bathing/dressing/bathroom;Assist for transportation;Help with stairs or ramp for entrance   Can travel by private vehicle   No    Equipment Recommendations Rolling walker (2 wheels)  Recommendations for Other Services       Functional Status Assessment Patient has had a recent decline in their functional status and demonstrates the ability to make significant improvements in function in a reasonable and predictable amount of time.     Precautions / Restrictions Precautions Precautions: Fall Restrictions LLE Weight Bearing Per Provider Order: Partial weight bearing LLE Partial Weight Bearing Percentage or Pounds: 50%      Mobility  Bed Mobility Overal bed mobility: Needs Assistance Bed Mobility: Supine to Sit     Supine to sit: Mod assist     General bed  mobility comments: incr time and effort. cues to self assist. bed pad used for lateral scooting, assist to progress LLE off bed and lower to floor    Transfers Overall transfer level: Needs assistance Equipment used: Rolling walker (2 wheels) Transfers: Sit to/from Stand, Bed to chair/wheelchair/BSC Sit to Stand: +2 physical assistance, +2 safety/equipment, Mod assist   Step pivot transfers: Min assist, +2 physical assistance, Mod assist       General transfer comment: cues for hand placement, LLE position, posture and use of UEs to self assist and adhere to [facility]    Ambulation/Gait                  Stairs            Wheelchair Mobility     Tilt Bed    Modified Rankin (Stroke Patients Only)       Balance Overall balance assessment: History of Falls, Needs assistance Sitting-balance support: Single extremity supported, Feet supported, Bilateral upper extremity supported Sitting balance-Leahy Scale: Poor     Standing balance support: During functional activity, Reliant on assistive device for balance Standing balance-Leahy Scale: Poor                               Pertinent Vitals/Pain Pain Assessment Pain Assessment: Faces Faces Pain Scale: Hurts even more Pain Location: left hip Pain Descriptors / Indicators: Aching, Sore Pain Intervention(s): Limited activity within patient's tolerance, Monitored during session, Premedicated before session, Repositioned, Ice applied    Home Living Family/patient  expects to be discharged to:: Skilled nursing facility Living Arrangements: (P) Children Available Help at Discharge: (P) Family Type of Home: (P) House Home Access: (P) Ramped entrance       Home Layout: (P) One level Home Equipment: (P) Rollator (4 wheels)      Prior Function Prior Level of Function : (P) Independent/Modified Independent             Mobility Comments: amb with rollator; O2 dependent at night        Extremity/Trunk Assessment   Upper Extremity Assessment Upper Extremity Assessment: Defer to OT evaluation    Lower Extremity Assessment Lower Extremity Assessment: RLE deficits/detail;LLE deficits/detail RLE Deficits / Details: ankle WFL; resistant to ROM R knee and hip RLE: Unable to fully assess due to pain LLE Deficits / Details: grossly 3+/5       Communication   Communication Communication: No apparent difficulties    Cognition Arousal: Alert Behavior During Therapy: WFL for tasks assessed/performed   PT - Cognitive impairments: No apparent impairments                         Following commands: Intact       Cueing Cueing Techniques: Verbal cues, Tactile cues, Visual cues     General Comments      Exercises General Exercises - Lower Extremity Ankle Circles/Pumps: AROM, Both, 5 reps   Assessment/Plan    PT Assessment Patient needs continued PT services  PT Problem List Decreased strength;Decreased range of motion;Decreased activity tolerance;Decreased balance;Decreased mobility;Decreased cognition;Decreased knowledge of use of DME;Decreased knowledge of precautions       PT Treatment Interventions DME instruction;Gait training;Functional mobility training;Therapeutic activities;Therapeutic exercise;Patient/family education;Balance training    PT Goals (Current goals can be found in the Care Plan section)  Acute Rehab PT Goals PT Goal Formulation: With patient/family Time For Goal Achievement: 05/24/23 Potential to Achieve Goals: Fair    Frequency Min 3X/week     Co-evaluation               AM-PAC PT "6 Clicks" Mobility  Outcome Measure Help needed turning from your back to your side while in a flat bed without using bedrails?: A Lot Help needed moving from lying on your back to sitting on the side of a flat bed without using bedrails?: A Lot Help needed moving to and from a bed to a chair (including a wheelchair)?: A Lot Help  needed standing up from a chair using your arms (e.g., wheelchair or bedside chair)?: A Lot Help needed to walk in hospital room?: Total Help needed climbing 3-5 steps with a railing? : Total 6 Click Score: 10    End of Session Equipment Utilized During Treatment: Gait belt Activity Tolerance: Patient tolerated treatment well Patient left: with call bell/phone within reach;in chair;with chair alarm set;with family/visitor present   PT Visit Diagnosis: Other abnormalities of gait and mobility (R26.89)    Time: 1610-9604 PT Time Calculation (min) (ACUTE ONLY): 35 min   Charges:   PT Evaluation $PT Eval Low Complexity: 1 Low PT Treatments $Therapeutic Activity: 8-22 mins PT General Charges $$ ACUTE PT VISIT: 1 Visit         Alexandria Ida, PT  Acute Rehab Dept [facility]) [phone]  05/10/2023   [facility] 05/10/2023, 3:45 PM

## 2023-05-11 DIAGNOSIS — S72002A Fracture of unspecified part of neck of left femur, initial encounter for closed fracture: Secondary | ICD-10-CM | POA: Diagnosis not present

## 2023-05-11 LAB — CBC
HCT: 26 % — ABNORMAL LOW (ref 36.0–46.0)
Hemoglobin: 8 g/dL — ABNORMAL LOW (ref 12.0–15.0)
MCH: 28.3 pg (ref 26.0–34.0)
MCHC: 30.8 g/dL (ref 30.0–36.0)
MCV: 91.9 fL (ref 80.0–100.0)
Platelets: 218 10*3/uL (ref 150–400)
RBC: 2.83 MIL/uL — ABNORMAL LOW (ref 3.87–5.11)
RDW: 14.3 % (ref 11.5–15.5)
WBC: 6.5 10*3/uL (ref 4.0–10.5)
nRBC: 0 % (ref 0.0–0.2)

## 2023-05-11 MED ORDER — HYDROMORPHONE HCL 1 MG/ML IJ SOLN: 0.5000 mg | INTRAMUSCULAR | Status: DC | PRN

## 2023-05-11 NOTE — Progress Notes (Signed)
 Patient ID: [name] [name], female   DOB: [birth year], 89 y.o.   MRN: [id number] Subjective: 2 Days Post-Op Procedure(s) (LRB): FIXATION, FRACTURE, INTERTROCHANTERIC, WITH INTRAMEDULLARY ROD (Left)    Patient reports pain as moderate making mobility difficult Patient and family very motivated to get her up and moving as much as possible Still experiencing issues with chest congestion  Objective:   VITALS:   Vitals:   05/10/23 2059 05/11/23 0605  BP: 129/66 (!) 103/49  Pulse: 81 73  Resp: 18 16  Temp: 97.8 F (36.6 C) 98.3 F (36.8 C)  SpO2: 98% 96%    Neurovascular intact Incision: dressing C/D/I  LABS Recent Labs    05/09/23 0317 05/10/23 0329 05/11/23 0334  HGB 10.5* 8.5* 8.0*  HCT 34.4* 26.9* 26.0*  WBC 8.4 6.0 6.5  PLT 217 195 218    Recent Labs    05/09/23 0702 05/10/23 0329  NA 133* 136  K 4.1 3.7  BUN 30* 23  CREATININE 1.06* 0.71  GLUCOSE 135* 129*    No results for input(s): "LABPT", "INR" in the last 72 hours.   Assessment/Plan: 2 Days Post-Op Procedure(s) (LRB): FIXATION, FRACTURE, INTERTROCHANTERIC, WITH INTRAMEDULLARY ROD (Left)   Up with therapy - if possible, have requested PT work today Otherwise reviewed in bed exercises and perhaps trying to work on bed mobility to ger her to a sated position Otherwise working towards SNF placement Rx on chart, signed RTC in 2 weeks

## 2023-05-11 NOTE — Progress Notes (Signed)
 Physical Therapy Treatment Patient Details Name: [name] [name] MRN: [id number] DOB: April 09, 1934 Today's Date: 05/11/2023   History of Present Illness 89 y.o. female  PMH significant for HTN, HLD, CAD, CHF, GERD, chronic pain, anxiety/depression. On 4/20, patient was brought to the ED after a mechanical fall landing on her left side, sustained severe left hip pain. Imaging in the ED showed intertrochanteric femur fracture. Orthopedics was consulted. pt is s/p IM nail on 05/09/23    PT Comments  Pt continues to be limited by pain and anxiousness. Max education and cues for relaxation, safety, and sequencing with transfers. Pt needing mod A+2 for safety with STS and step pivot transfer, assist to maintain RW in correct position, intermittent "stop and breath" moments. Mod A+2 for LLE and trunk assist with return to supine, pt using bedrails to assist with BUE. Pt tolerates passive heelslide, SLR and hip abd/add ~25% ROM. Pt's daughter present providing encouragement throughout session.   If plan is discharge home, recommend the following: A lot of help with walking and/or transfers;A lot of help with bathing/dressing/bathroom;Assist for transportation;Help with stairs or ramp for entrance   Can travel by private vehicle     No  Equipment Recommendations  Rolling walker (2 wheels)    Recommendations for Other Services       Precautions / Restrictions Precautions Precautions: Fall Restrictions Weight Bearing Restrictions Per Provider Order: Yes LLE Weight Bearing Per Provider Order: Partial weight bearing LLE Partial Weight Bearing Percentage or Pounds: 50%     Mobility  Bed Mobility Overal bed mobility: Needs Assistance Bed Mobility: Sit to Supine     Sit to supine: Mod assist, +2 for physical assistance   General bed mobility comments: mod A+2 for return to supine and scooting up in bed with bed pad, 1 person assistin with LLE and other with pt's trunk, significantly increased  time, pt limited by pain and self directed in techniques despite therapist educating on appropriate sequencing    Transfers Overall transfer level: Needs assistance Equipment used: Rolling walker (2 wheels) Transfers: Sit to/from Stand, Bed to chair/wheelchair/BSC Sit to Stand: Mod assist, +2 safety/equipment   Step pivot transfers: Mod assist, +2 safety/equipment       General transfer comment: mod A+2 for safety with powering to stand and step pivot from recliner back to bed; pt reports anxiousness, heavy cues for relaxation, pt self directed in technique needing max education on safety and sequencing with RW, assist to manage RW appropriately, cues for LLE PWB 50% and pt appears to respect    Ambulation/Gait               General Gait Details: pt takes 2 sidesteps with bed behind, able to clear each foot, assist for RW management and appears to respect LLE 50% WB status, unabl to take steps away from bed/recliner   Stairs             Wheelchair Mobility     Tilt Bed    Modified Rankin (Stroke Patients Only)       Balance Overall balance assessment: History of Falls, Needs assistance Sitting-balance support: Feet supported, Bilateral upper extremity supported Sitting balance-Leahy Scale: Poor Sitting balance - Comments: setaed EOB with BUE support   Standing balance support: During functional activity, Reliant on assistive device for balance Standing balance-Leahy Scale: Poor Standing balance comment: mod A with RW  Communication Communication Communication: No apparent difficulties  Cognition Arousal: Alert Behavior During Therapy: Anxious   PT - Cognitive impairments: No apparent impairments                       PT - Cognition Comments: pt needing cues for relaxation and attention to task due to increased anxiousness with movement Following commands: Intact      Cueing Cueing Techniques: Verbal  cues, Tactile cues, Visual cues  Exercises General Exercises - Lower Extremity Ankle Circles/Pumps: AROM, Both, 10 reps Heel Slides: PROM, Left, 5 reps (within pain tolerable range (~25% ROM)) Hip ABduction/ADduction: PROM, Left, 5 reps (within pain tolerable range (~25% ROM)) Straight Leg Raises: PROM, Left, 5 reps (within pain tolerable range (~25% ROM))    General Comments        Pertinent Vitals/Pain Pain Assessment Pain Assessment: 0-10 Pain Score: 10-Worst pain ever Pain Location: "all the way down my left leg" with movement Pain Descriptors / Indicators: Aching, Sore Pain Intervention(s): Limited activity within patient's tolerance, Monitored during session, Premedicated before session, Repositioned, Ice applied    Home Living                          Prior Function            PT Goals (current goals can now be found in the care plan section) Acute Rehab PT Goals PT Goal Formulation: With patient/family Time For Goal Achievement: 05/24/23 Potential to Achieve Goals: Fair Progress towards PT goals: Progressing toward goals    Frequency    Min 3X/week      PT Plan      Co-evaluation              AM-PAC PT "6 Clicks" Mobility   Outcome Measure  Help needed turning from your back to your side while in a flat bed without using bedrails?: A Lot Help needed moving from lying on your back to sitting on the side of a flat bed without using bedrails?: A Lot Help needed moving to and from a bed to a chair (including a wheelchair)?: A Lot Help needed standing up from a chair using your arms (e.g., wheelchair or bedside chair)?: A Lot Help needed to walk in hospital room?: Total Help needed climbing 3-5 steps with a railing? : Total 6 Click Score: 10    End of Session Equipment Utilized During Treatment: Gait belt;Oxygen  Activity Tolerance: Patient limited by pain Patient left: in bed;with call bell/phone within reach;with bed alarm set;with  family/visitor present Nurse Communication: Mobility status PT Visit Diagnosis: Other abnormalities of gait and mobility (R26.89)     Time: 1425-1445 PT Time Calculation (min) (ACUTE ONLY): 20 min  Charges:    $Therapeutic Activity: 8-22 mins PT General Charges $$ ACUTE PT VISIT: 1 Visit                     Tori Saphire Barnhart PT, DPT 05/11/23, 3:09 PM

## 2023-05-11 NOTE — TOC Progression Note (Addendum)
 Transition of Care [facility]) - Progression Note    Patient Details  Name: [name] [name] MRN: [id number] Date of Birth: Jul 26, 1934  [facility]) CM/SW Contact  Amaryllis Junior, [place] Phone Number: 05/11/2023, 9:46 AM  Clinical Narrative:    Pt recommended STR. Pt and family accepting of recommendation. FL2 completed. CSW faxed out SNF referral; awaiting bed offers.   1pm- CSW presented pt and pt dtr with bed offers. [facility] considering [facility]. Dtr planning to tour [place]. CSW will follow up with decision on bed choice.   Expected Discharge Plan:  (TBD) Barriers to Discharge: Continued Medical Work up  Expected Discharge Plan and Services In-house Referral: Clinical Social Work     Living arrangements for the past 2 months: Single Family Home                                       Social Determinants of Health (SDOH) Interventions SDOH Screenings   Food Insecurity: No Food Insecurity (05/07/2023)  Housing: Low Risk  (05/07/2023)  Transportation Needs: No Transportation Needs (05/07/2023)  Utilities: Not At Risk (05/07/2023)  Alcohol Screen: Low Risk  (05/17/2022)  Depression (PHQ2-9): High Risk (08/15/2022)  Financial Resource Strain: Low Risk  (05/17/2022)  Physical Activity: Inactive (05/17/2022)  Social Connections: Patient Declined (05/08/2023)  Stress: Stress Concern Present (05/17/2022)  Tobacco Use: Low Risk  (05/09/2023)    Readmission Risk Interventions    05/11/2023    9:43 AM  Readmission Risk Prevention Plan  Post Dischage Appt Complete  Medication Screening Complete  Transportation Screening Complete

## 2023-05-11 NOTE — TOC CM/SW Note (Signed)
 CMS list of facilities and star ratings provided to pt to review for facility preference.       [facility] [address] [place], [place] [address] [phone] Overall rating ??  Below average  [facility] at the [facility] [address] [place], [place] [address] [phone] Overall rating ? Below average  [facility] [address] [place], [place] [address] [phone] Overall rating? Below average  [facility] [address] [place], [place] [address] [phone] Overall rating ? Much below average  [facility] [address] [place], [place] [address] [phone] Overall rating ???? Average  [facility] [address] [place], [place] [address] [phone] Overall rating ? Much below average   [facility] [address] [place], [place] [address] [phone] Overall rating ?? Much below average  [employer] [facility] [employer] [address] [place], [place] [address] [phone] Overall rating ??? Average  [facility] [address] [place], [place] [address] [phone] Overall rating ? Much below average  [facility] [address] [place], [place] [address] [phone] Overall rating ?? Much below average  [facility] [address] [place], [place] [address] [phone] Overall rating ??? Above average  [facility] [address] [place], [place] [address] [phone] Overall rating ????? Much above average  [facility] [address] [place], [place] [address] [phone] Overall rating ???? Above average  [facility] [address] [place], [place]  [address] [phone] Overall rating ????? Much above average  [facility] [address] [place], [place] [address] [phone] Overall rating ????  [facility] [address] [place], [place] [address] [phone] Overall rating ???? Much above average  [facility] [address] [place], [place] [address] [phone] Overall rating ????? Much above average  [facility] [address] [place], [place] [address] [phone] Overall rating ? Much below average  [facility] [address] [place], [place] [address] [phone] Overall rating ??? Average  [facility] [address] [place], [place] [address] [phone] Overall rating ????? Much above average  [facility] [address] [place], [place] [address] [phone] Overall rating ??? Average  [facility] [address] [place], [place] [address] [phone] Overall rating ?? Below average  [facility] [address] [place], [place] [address] [phone] Overall rating ??? Average  [facility] [address] [place], [place] [address] [phone] Overall rating ????? Much above average  [facility] [address] [place], [place] [address] [phone] Overall rating ? Much below average  [facility]	[place] at [facility], [place] [address] [phone] Overall rating ????? Much above average  [facility] [address] [place], [place] [address] [phone] Overall rating ??? Much below average  [facility] [address] [place], [place] [address] [phone] Overall rating ??? Average  [facility] and  [facility] [address] [place], [place] [address] [phone] Overall rating ??  Below average  [facility] [address] [place], [place] [address] [phone] Overall rating ? Much below average  [facility] [address] [place], [place] [address] [phone] Overall rating ??? Average  [facility] [address] [place], [place] [address] [phone] Overall rating ? Much below average  [facility] [address] [place], [place] [address] [phone] Overall rating ??? Average  [facility] [address] [place], [place] [address] [phone] Overall rating ? Much below average  [employer] [address] [place], [place] [address] [phone] Overall rating ????? Much above average  [facility] [address] [place], [place] [address] [phone] Overall rating ? Much below average  [facility] [address] [place], [place] [address] [phone] Overall rating ???? Above average  [facility] [address] [place], [place] [address] [phone] Overall rating ????? Above average  [facility] [address] [place], [place] [address] [phone] Overall rating ???? Above average  [facility] [address] [place], [place] [address] [phone] Overall rating ????? Much above average  [facility] [address] [place], [place] [address] [phone] Overall rating ? Much below average  [facility] [address] [place], [place] [address] [phone] Overall rating ????? Much above average  [facility] [address] [place], [place] [address] [phone] Overall rating ? Much below  average  [facility] [address] [place], [place] [address] [phone] Overall rating ????? Much above average  [facility] [address] [place], [place] [address] [phone] Overall rating ????? Much above average  [facility] [address] [place], [place] [address] [phone] Overall rating ??? Average  [facility] [address] [place], [place] [address] [phone] Overall rating ??? Average  [facility] [address] [place], [place] [address] [phone] Overall rating ???? Above average  [facility] [address] [place], [place] [address] [phone] Overall rating ?? Below average  [facility] [address] [place], [place] [address] [phone] Overall rating ???? Above average  [facility] [address] [place], [place] [address] [phone] Overall rating ?? Below average  [facility] [address] [place], [place] [address] [phone] Overall rating ??

## 2023-05-11 NOTE — NC FL2 (Signed)
 [place]  MEDICAID FL2 LEVEL OF CARE FORM     IDENTIFICATION  Patient Name: [name] [name] Birthdate: [birth year] Sex: female Admission Date (Current Location): 05/07/2023  [facility] and [place] Number:  Producer, television/film/video and Address:  [facility],  [address] [place], [place] [address]      Provider Number: [id number]  Attending Physician Name and Address:  Hoyt Macleod, MD  Relative Name and Phone Number:  [place], [facility])  [phone] [facility])    Current Level of Care: Hospital Recommended Level of Care: Skilled Nursing Facility Prior Approval Number:    Date Approved/Denied:   PASRR Number: [id number]  Discharge Plan: SNF    Current Diagnoses: Patient Active Problem List   Diagnosis Date Noted   Hip fracture (HCC) 05/07/2023   Acute bronchitis 02/28/2023   Arthritis of both hands 08/15/2022   Cerumen impaction 06/24/2022   Right shoulder pain 12/28/2021   Esophageal dysphagia 12/06/2021   Current use of proton pump inhibitor 05/12/2021   Precordial chest pain 05/07/2021   Aortic atherosclerosis (HCC) 05/07/2021   Coronary artery calcification seen on CT scan 05/07/2021   Myalgia 12/09/2019   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 12/09/2019   Chronic tension-type headache, not intractable 09/06/2019   Ageusia 09/06/2019   Anosmia 09/06/2019   Trigeminal neuralgia 03/15/2019   Mobility impaired 12/11/2018   Poor balance 08/13/2018   Memory loss 06/27/2018   Physical deconditioning 03/19/2018   Chronic heart failure with preserved ejection fraction (HCC) 02/09/2018   Prediabetes 01/26/2017   Hearing loss 01/19/2016   Carpal tunnel syndrome 08/31/2015   Encounter for Medicare annual wellness exam 06/04/2014   Obesity 06/04/2014   Estrogen deficiency 06/04/2014   Schatzki's ring 03/27/2014   OA (osteoarthritis) of knee 08/16/2011   Hemorrhoids 08/10/2011   Other dyspnea and respiratory abnormality 06/21/2010   Mixed  incontinence 05/27/2010   OVERACTIVE BLADDER 03/12/2010   Lumbar degenerative disc disease 04/13/2009   Chronic GERD 10/15/2008   History of cardiovascular disorder 10/15/2008   Arthropathy, multiple sites 06/26/2008   External hemorrhoids 02/01/2008   Diaphragmatic hernia 02/01/2008   Diverticulosis of colon 02/01/2008   GASTRITIS, HX OF 02/01/2008   DYSPNEA 09/19/2007   Vitamin D  deficiency 07/03/2007   Osteoporosis of lumbar spine 06/13/2007   Fatigue 06/13/2007   History of Helicobacter pylori infection 06/06/2006   Hyperlipidemia 06/06/2006   Generalized anxiety disorder 06/06/2006   Adjustment disorder with mixed anxiety and depressed mood 06/06/2006   Macular degeneration (senile) of retina 06/06/2006   H/O: CVA (cerebrovascular accident) 06/06/2006   Osteoarthritis 06/06/2006    Orientation RESPIRATION BLADDER Height & Weight     Self, Time, Situation, Place  O2 (2L O2) Incontinent Weight: 181 lb (82.1 kg) Height:  5\' 4"  (162.6 cm)  BEHAVIORAL SYMPTOMS/MOOD NEUROLOGICAL BOWEL NUTRITION STATUS      Continent Diet (regular)  AMBULATORY STATUS COMMUNICATION OF NEEDS Skin   Limited Assist Verbally Surgical wounds                       Personal Care Assistance Level of Assistance  Bathing, Dressing, Feeding Bathing Assistance: Limited assistance Feeding assistance: Independent Dressing Assistance: Limited assistance     Functional Limitations Info  Speech, Hearing, Sight Sight Info: Impaired Hearing Info: Impaired Speech Info: Adequate    SPECIAL CARE FACTORS FREQUENCY  PT (By licensed PT), OT (By licensed OT)     PT Frequency: 5x/wk OT Frequency: 5x/wk  Contractures Contractures Info: Not present    Additional Factors Info  Code Status, Allergies Code Status Info: full code Allergies Info: Alendronate Sodium  Celecoxib  Chocolate  Omeprazole  Rabeprazole  Latex  Sulfa  Antibiotics           Current Medications (05/11/2023):  This  is the current hospital active medication list Current Facility-Administered Medications  Medication Dose Route Frequency Provider Last Rate Last Admin   acetaminophen  (TYLENOL ) tablet 1,000 mg  1,000 mg Oral Q6H Earnie Gola, PA-C   1,000 mg at 05/11/23 3086   alum & mag hydroxide-simeth (MAALOX/MYLANTA) 200-200-20 MG/5ML suspension 30 mL  30 mL Oral Q4H PRN Earnie Gola, PA-C       atorvastatin  (LIPITOR) tablet 10 mg  10 mg Oral QHS Earnie Gola, PA-C   10 mg at 05/10/23 2125   bisacodyl  (DULCOLAX) suppository 10 mg  10 mg Rectal Daily PRN Earnie Gola, PA-C       buPROPion  (WELLBUTRIN  XL) 24 hr tablet 300 mg  300 mg Oral Daily Donovan, Ashley R, PA-C   300 mg at 05/11/23 5784   clopidogrel  (PLAVIX ) tablet 75 mg  75 mg Oral QPM Earnie Gola, PA-C   75 mg at 05/10/23 1711   diphenhydrAMINE  (BENADRYL ) 12.5 MG/5ML elixir 12.5-25 mg  12.5-25 mg Oral Q4H PRN Earnie Gola, PA-C       famotidine  (PEPCID ) tablet 40 mg  40 mg Oral Daily Earnie Gola, PA-C   40 mg at 05/11/23 0930   gabapentin  (NEURONTIN ) capsule 200 mg  200 mg Oral QHS Donovan, Ashley R, PA-C   200 mg at 05/10/23 2125   HYDROmorphone  (DILAUDID ) injection 0.5-1 mg  0.5-1 mg Intravenous Q4H PRN Earnie Gola, PA-C   1 mg at 05/10/23 1712   loratadine  (CLARITIN ) tablet 10 mg  10 mg Oral Daily PRN Earnie Gola, PA-C   10 mg at 05/07/23 2055   menthol -cetylpyridinium (CEPACOL) lozenge 3 mg  1 lozenge Oral PRN Earnie Gola, PA-C       Or   phenol (CHLORASEPTIC) mouth spray 1 spray  1 spray Mouth/Throat PRN Earnie Gola, PA-C       methocarbamol  (ROBAXIN ) tablet 500 mg  500 mg Oral Q6H PRN Earnie Gola, PA-C       Or   methocarbamol  (ROBAXIN ) injection 500 mg  500 mg Intravenous Q6H PRN Earnie Gola, PA-C       metoCLOPramide  (REGLAN ) tablet 5-10 mg  5-10 mg Oral Q8H PRN Earnie Gola, PA-C       Or   metoCLOPramide  (REGLAN ) injection 5-10 mg  5-10 mg Intravenous Q8H PRN  Earnie Gola, PA-C       metoprolol  succinate (TOPROL -XL) 24 hr tablet 12.5 mg  12.5 mg Oral QHS Donovan, Ashley R, PA-C   12.5 mg at 05/10/23 2125   ondansetron  (ZOFRAN ) tablet 4 mg  4 mg Oral Q6H PRN Earnie Gola, PA-C       Or   ondansetron  (ZOFRAN ) injection 4 mg  4 mg Intravenous Q6H PRN Earnie Gola, PA-C       oxyCODONE  (Oxy IR/ROXICODONE ) immediate release tablet 2.5-5 mg  2.5-5 mg Oral Q4H PRN Kim Pen R, PA-C   5 mg at 05/11/23 0608   oxyCODONE  (Oxy IR/ROXICODONE ) immediate release tablet 5-10 mg  5-10 mg Oral Q4H PRN Earnie Gola, PA-C   5 mg at 05/10/23 2125   pantoprazole  (PROTONIX ) EC tablet 40 mg  40 mg Oral Daily Waylan Haggard, RPH   40 mg at 05/11/23 0930   polyethylene glycol (MIRALAX  / GLYCOLAX ) packet 17 g  17 g Oral BID Earnie Gola, PA-C   17 g at 05/11/23 0930   senna (SENOKOT) tablet 17.2 mg  2 tablet Oral QHS Earnie Gola, PA-C   17.2 mg at 05/10/23 2125   sertraline  (ZOLOFT ) tablet 100 mg  100 mg Oral BID Donovan, Ashley R, PA-C   100 mg at 05/11/23 0930   sodium chloride  flush (NS) 0.9 % injection 3-10 mL  3-10 mL Intravenous Q12H Earnie Gola, PA-C   3 mL at 05/11/23 1610   sodium chloride  flush (NS) 0.9 % injection 3-10 mL  3-10 mL Intravenous PRN Earnie Gola, PA-C         Discharge Medications: Please see discharge summary for a list of discharge medications.  Relevant Imaging Results:  Relevant Lab Results:   Additional Information SSN [id number]  Amaryllis Junior, LCSW

## 2023-05-11 NOTE — Progress Notes (Signed)
 PROGRESS NOTE  [name] [name]  DOB: June 17, 1934  PCP: Clemens Curt, MD ZOX:[id number]  DOA: 05/07/2023  LOS: 3 days  Hospital Day: 5  Brief narrative: [name] [name] is a 89 y.o. female with PMH significant for HTN, HLD, CAD, CHF, GERD, chronic pain, anxiety/depression 4/20, patient was brought to the ED after a mechanical fall landing on her left side, sustained severe left hip pain Imaging in the ED showed intertrochanteric femur fracture Orthopedics was consulted Admitted to [facility] 4/22 -underwent intramedullary rod placement in left femur by Dr. Bernard Brick  Subjective: Patient was seen and examined this afternoon. Sitting up in recliner.  Not in distress.  Complains of pain with movement. Had to be moved multiple times in bed because of bed getting wet from p.o. we can ice packs. Labs this with hemoglobin down to 8.  Assessment and plan: Left intertrochanteric femur fracture S/p IM rod  -4/22 Dr. Bernard Brick Fractures secondary to secondary to mechanical fall Imaging and procedure as above  Pain regimen: Scheduled Tylenol , as needed oxycodone .  Advised to use IV Dilaudid  if needed at night for good sleep. Bowel regimen: Scheduled Senokot, as needed MiraLAX  DVT prophylaxis: Currently on SCDs. Plavix  has been resumed. Per orthopedics, patient will maintain partial weightbearing for at least 6 weeks.  Chronic pain Was chronically on gabapentin  3 times daily. While on opioids, currently on gabapentin  nightly only  Acute postop blood loss anemia Hemoglobin down to 8 this morning.  Secondary to blood loss from fracture and surgery. No bleeding elsewhere. Repeat labs tomorrow.  Transfuse if less than 7.  Type and screen ordered as well. Recent Labs    06/29/22 1118 05/07/23 1538 05/07/23 1538 05/08/23 0347 05/09/23 0317 05/10/23 0329 05/11/23 0334  HGB 12.7 12.0  --  11.6* 10.5* 8.5* 8.0*  MCV 88 88.5   < > 91.2 92.2 90.3 91.9  VITAMINB12 530  --   --   --   --   --   --     < > = values in this interval not displayed.   Hypokalemia Potassium level improved with replacement Recent Labs  Lab 05/07/23 1538 05/08/23 0347 05/09/23 0702 05/10/23 0329  K 3.4* 4.4 4.1 3.7   Chronic diastolic CHF Hypertension Currently compensated CHF PTA meds-Toprol  25 mg daily, Lasix  20 mg daily Continue metoprolol .  Continue to hold Lasix .    CAD, HLD Continue metoprolol , Plavix , Lipitor  GERD Hiatal hernia PPI, Pepcid   Anxiety/depression Zoloft , bupropion     Mobility: PT eval postprocedure  Goals of care   Code Status: Full Code     DVT prophylaxis:  SCDs Start: 05/09/23 1304 Place TED hose Start: 05/09/23 1304 SCDs Start: 05/07/23 1859   Antimicrobials: None Fluid: None Consultants: Orthopedics Family Communication: Daughter at bedside  Status: Observation Level of care:  Med-Surg   Patient is from: Home Needs to continue in-hospital care: POD 1, pending PT eval. Anticipated d/c to: Likely needs SNF      Diet:  Diet Order             Diet regular Room service appropriate? Yes; Fluid consistency: Thin  Diet effective now                   Scheduled Meds:  acetaminophen   1,000 mg Oral Q6H   atorvastatin   10 mg Oral QHS   buPROPion   300 mg Oral Daily   clopidogrel   75 mg Oral QPM   famotidine   40 mg Oral Daily  gabapentin   200 mg Oral QHS   metoprolol  succinate  12.5 mg Oral QHS   pantoprazole   40 mg Oral Daily   polyethylene glycol  17 g Oral BID   senna  2 tablet Oral QHS   sertraline   100 mg Oral BID   sodium chloride  flush  3-10 mL Intravenous Q12H    PRN meds: alum & mag hydroxide-simeth, bisacodyl , diphenhydrAMINE , HYDROmorphone  (DILAUDID ) injection, loratadine , menthol -cetylpyridinium **OR** phenol, methocarbamol  **OR** methocarbamol  (ROBAXIN ) injection, metoCLOPramide  **OR** metoCLOPramide  (REGLAN ) injection, ondansetron  **OR** ondansetron  (ZOFRAN ) IV, oxyCODONE , oxyCODONE , sodium chloride  flush   Infusions:      Antimicrobials: Anti-infectives (From admission, onward)    Start     Dose/Rate Route Frequency Ordered Stop   05/09/23 1600  ceFAZolin  (ANCEF ) IVPB 2g/100 mL premix        2 g 200 mL/hr over 30 Minutes Intravenous Every 6 hours 05/09/23 1304 05/09/23 2155   05/09/23 0830  ceFAZolin  (ANCEF ) IVPB 2g/100 mL premix        2 g 200 mL/hr over 30 Minutes Intravenous On call to O.R. 05/09/23 0741 05/09/23 1016       Objective: Vitals:   05/10/23 2059 05/11/23 0605  BP: 129/66 (!) 103/49  Pulse: 81 73  Resp: 18 16  Temp: 97.8 F (36.6 C) 98.3 F (36.8 C)  SpO2: 98% 96%    Intake/Output Summary (Last 24 hours) at 05/11/2023 1411 Last data filed at 05/11/2023 0941 Gross per 24 hour  Intake 900 ml  Output 900 ml  Net 0 ml   Filed Weights   05/07/23 2007  Weight: 82.1 kg   Weight change:  Body mass index is 31.07 kg/m.   Physical Exam: General exam: Pleasant, elderly Caucasian female.   Skin: No rashes, lesions or ulcers. HEENT: Atraumatic, normocephalic, no obvious bleeding Lungs: Clear to auscultation bilaterally,  CVS: S1, S2, no murmur,   GI/Abd: Soft, nontender, nondistended, bowel sound present,   CNS: Alert, awake, oriented x 3. Psychiatry: Mood appropriate,  Extremities: No pedal edema, no calf tenderness,   Data Review: I have personally reviewed the laboratory data and studies available.  F/u labs  Unresulted Labs (From admission, onward)    None      Total time spent in review of labs and imaging, patient evaluation, formulation of plan, documentation and communication with family: 45 minutes  Signed, Hoyt Macleod, MD [facility] 05/11/2023

## 2023-05-11 NOTE — Progress Notes (Signed)
 Physical Therapy Treatment Patient Details Name: [name] [name] MRN: [id number] DOB: 10/27/1934 Today's Date: 05/11/2023   History of Present Illness 89 y.o. female  PMH significant for HTN, HLD, CAD, CHF, GERD, chronic pain, anxiety/depression. On 4/20, patient was brought to the ED after a mechanical fall landing on her left side, sustained severe left hip pain. Imaging in the ED showed intertrochanteric femur fracture. Orthopedics was consulted. pt is s/p IM nail on 05/09/23    PT Comments  Pt limited by pain, needing significantly increased time and encouragement/soothing to mobilize. Pt comes to sitting EOB with mod A as therapist assists LLE and supports for controlled lowering to floor, pt using heaving BUE on bedrails to assist trunk and scoot forward. Once seated, pt needing mod A+2 for safety to power up, pt verbalizes strong fear of falling and pain requesting 2nd person close by. Step pivot to recliner with mod A+2 for safety, pt needing assistance for maneuvering RW, cues for upright posture, widening BOS, able to clear each foot with steps over to drop arm recliner. Pt attempting to sit prior to in position and verbalizes sensation of falling, heavy education and calming cues to improve standing balance while static standing and then to engage in lowering to sitting to recliner with control.Pt's daughter present throughout offering calming encouragement. Cont to rec inpatient follow up therapy, <3 hours/day.    If plan is discharge home, recommend the following: A lot of help with walking and/or transfers;A lot of help with bathing/dressing/bathroom;Assist for transportation;Help with stairs or ramp for entrance   Can travel by private vehicle     No  Equipment Recommendations  Rolling walker (2 wheels)    Recommendations for Other Services       Precautions / Restrictions Precautions Precautions: Fall Restrictions Weight Bearing Restrictions Per Provider Order: Yes LLE  Weight Bearing Per Provider Order: Partial weight bearing LLE Partial Weight Bearing Percentage or Pounds: 50%     Mobility  Bed Mobility Overal bed mobility: Needs Assistance Bed Mobility: Supine to Sit     Supine to sit: Mod assist, HOB elevated, Used rails     General bed mobility comments: significantly increased time and effort, verbal cues for UE use on bedrails to self assist, therapist supporting and assisting LLE to EOB and gently lowering down to floor    Transfers Overall transfer level: Needs assistance Equipment used: Rolling walker (2 wheels) Transfers: Sit to/from Stand, Bed to chair/wheelchair/BSC Sit to Stand: Mod assist, +2 safety/equipment   Step pivot transfers: Mod assist, +2 safety/equipment       General transfer comment: mod A +2 to power up from EOB, verbal cues for BUE to push from bed, assist to widen BOS; mod A +2 for safety for step pivot to recliner at bedside, increased time, verbal cues, assist to maneuver RW appropriately and intermittent verbal cues for LLE 50% WB with pt appearing to respect    Ambulation/Gait                   Stairs             Wheelchair Mobility     Tilt Bed    Modified Rankin (Stroke Patients Only)       Balance Overall balance assessment: History of Falls, Needs assistance Sitting-balance support: Feet supported, Bilateral upper extremity supported Sitting balance-Leahy Scale: Poor Sitting balance - Comments: setaed EOB with BUE support   Standing balance support: During functional activity, Reliant on assistive device for  balance Standing balance-Leahy Scale: Poor Standing balance comment: mod A with RW                            Communication Communication Communication: No apparent difficulties  Cognition Arousal: Alert Behavior During Therapy: WFL for tasks assessed/performed, Anxious   PT - Cognitive impairments: No apparent impairments                          Following commands: Intact      Cueing Cueing Techniques: Verbal cues, Tactile cues, Visual cues  Exercises General Exercises - Lower Extremity Ankle Circles/Pumps: AROM, Both, 5 reps    General Comments        Pertinent Vitals/Pain Pain Assessment Pain Assessment: 0-10 Pain Score: 10-Worst pain ever Pain Location: left hip, L knee Pain Descriptors / Indicators: Aching, Sore Pain Intervention(s): Limited activity within patient's tolerance, Monitored during session, Premedicated before session, Repositioned, Ice applied    Home Living                          Prior Function            PT Goals (current goals can now be found in the care plan section) Acute Rehab PT Goals PT Goal Formulation: With patient/family Time For Goal Achievement: 05/24/23 Potential to Achieve Goals: Fair Progress towards PT goals: Progressing toward goals    Frequency    Min 3X/week      PT Plan      Co-evaluation              AM-PAC PT "6 Clicks" Mobility   Outcome Measure  Help needed turning from your back to your side while in a flat bed without using bedrails?: A Lot Help needed moving from lying on your back to sitting on the side of a flat bed without using bedrails?: A Lot Help needed moving to and from a bed to a chair (including a wheelchair)?: A Lot Help needed standing up from a chair using your arms (e.g., wheelchair or bedside chair)?: A Lot Help needed to walk in hospital room?: Total Help needed climbing 3-5 steps with a railing? : Total 6 Click Score: 10    End of Session Equipment Utilized During Treatment: Gait belt;Oxygen  Activity Tolerance: Patient limited by pain Patient left: with call bell/phone within reach;in chair;with chair alarm set;with family/visitor present Nurse Communication: Mobility status PT Visit Diagnosis: Other abnormalities of gait and mobility (R26.89)     Time: 1610-9604 PT Time Calculation (min) (ACUTE ONLY): 23  min  Charges:    $Therapeutic Activity: 23-37 mins PT General Charges $$ ACUTE PT VISIT: 1 Visit                     Tori Dezaree Tracey PT, DPT 05/11/23, 1:42 PM

## 2023-05-11 NOTE — Plan of Care (Signed)

## 2023-05-11 NOTE — Plan of Care (Signed)
  Problem: Activity: Goal: Risk for activity intolerance will decrease Outcome: Progressing   Problem: Safety: Goal: Ability to remain free from injury will improve Outcome: Progressing   Problem: Skin Integrity: Goal: Risk for impaired skin integrity will decrease Outcome: Progressing   Problem: Education: Goal: Knowledge of the prescribed therapeutic regimen will improve Outcome: Progressing

## 2023-05-12 ENCOUNTER — Inpatient Hospital Stay (HOSPITAL_COMMUNITY)

## 2023-05-12 DIAGNOSIS — S72002A Fracture of unspecified part of neck of left femur, initial encounter for closed fracture: Secondary | ICD-10-CM | POA: Diagnosis not present

## 2023-05-12 LAB — CBC WITH DIFFERENTIAL/PLATELET
Abs Immature Granulocytes: 0.04 10*3/uL (ref 0.00–0.07)
Basophils Absolute: 0 10*3/uL (ref 0.0–0.1)
Basophils Relative: 1 %
Eosinophils Absolute: 0.2 10*3/uL (ref 0.0–0.5)
Eosinophils Relative: 4 %
HCT: 26.4 % — ABNORMAL LOW (ref 36.0–46.0)
Hemoglobin: 8.2 g/dL — ABNORMAL LOW (ref 12.0–15.0)
Immature Granulocytes: 1 %
Lymphocytes Relative: 28 %
Lymphs Abs: 1.8 10*3/uL (ref 0.7–4.0)
MCH: 28.6 pg (ref 26.0–34.0)
MCHC: 31.1 g/dL (ref 30.0–36.0)
MCV: 92 fL (ref 80.0–100.0)
Monocytes Absolute: 0.8 10*3/uL (ref 0.1–1.0)
Monocytes Relative: 13 %
Neutro Abs: 3.5 10*3/uL (ref 1.7–7.7)
Neutrophils Relative %: 53 %
Platelets: 272 10*3/uL (ref 150–400)
RBC: 2.87 MIL/uL — ABNORMAL LOW (ref 3.87–5.11)
RDW: 14.7 % (ref 11.5–15.5)
WBC: 6.3 10*3/uL (ref 4.0–10.5)
nRBC: 0.3 % — ABNORMAL HIGH (ref 0.0–0.2)

## 2023-05-12 LAB — TYPE AND SCREEN
ABO/RH(D): B POS
Antibody Screen: NEGATIVE

## 2023-05-12 MED ORDER — HYDROMORPHONE HCL 1 MG/ML IJ SOLN
0.5000 mg | INTRAMUSCULAR | Status: DC | PRN
  Administered 2023-05-12 (×3): 0.5 mg via INTRAVENOUS
  Filled 2023-05-12 (×3): qty 0.5

## 2023-05-12 MED ORDER — METHOCARBAMOL 500 MG PO TABS
500.0000 mg | ORAL_TABLET | Freq: Three times a day (TID) | ORAL | Status: DC
  Administered 2023-05-12 (×2): 500 mg via ORAL
  Filled 2023-05-12 (×2): qty 1

## 2023-05-12 MED ORDER — METHOCARBAMOL 500 MG PO TABS: 500.0000 mg | ORAL_TABLET | Freq: Three times a day (TID) | ORAL | Status: DC

## 2023-05-12 MED ORDER — OXYCODONE HCL 5 MG PO TABS
5.0000 mg | ORAL_TABLET | Freq: Four times a day (QID) | ORAL | Status: DC
  Administered 2023-05-12: 5 mg via ORAL
  Filled 2023-05-12: qty 1

## 2023-05-12 MED ORDER — OXYCODONE HCL 5 MG PO TABS
10.0000 mg | ORAL_TABLET | Freq: Four times a day (QID) | ORAL | Status: DC
  Administered 2023-05-12 – 2023-05-13 (×3): 10 mg via ORAL
  Filled 2023-05-12 (×3): qty 2

## 2023-05-12 MED ORDER — METHOCARBAMOL 1000 MG/10ML IJ SOLN
500.0000 mg | Freq: Three times a day (TID) | INTRAMUSCULAR | Status: DC
Start: 2023-05-12 — End: 2023-05-12

## 2023-05-12 NOTE — Progress Notes (Signed)
 PROGRESS NOTE  [name] [name]  DOB: Jan 30, 1934  PCP: Clemens Curt, MD GUR:[id number]  DOA: 05/07/2023  LOS: 4 days  Hospital Day: 6  Brief narrative: [name] [name] is a 89 y.o. female with PMH significant for HTN, HLD, CAD, CHF, GERD, chronic pain, anxiety/depression 4/20, patient was brought to the ED after a mechanical fall landing on her left side, sustained severe left hip pain Imaging in the ED showed intertrochanteric femur fracture Orthopedics was consulted Admitted to [facility] 4/22 -underwent intramedullary rod placement in left femur by Dr. Bernard Brick  Subjective: Patient was seen and examined this morning. Elderly Caucasian female.  Propped up in bed.  Complains of intermittent severe pain on left hip also radiating to left knee.   Daughter at bedside. Use of pain meds reviewed. She is getting scheduled Tylenol  and as needed oxycodone , as needed Robaxin  but did not get any IV Dilaudid  yesterday. CBC pending this morning  Assessment and plan: Left intertrochanteric femur fracture S/p IM rod  -4/22 Dr. Bernard Brick Fractures secondary to secondary to mechanical fall Imaging and procedure as above  Pain regimen revised.  Continue scheduled Tylenol , switch oxycodone  and Robaxin  to scheduled today.  Continue IV Dilaudid  today. Complains of left knee pain today.  Has history of left knee surgery with complication.  Obtain left knee x-ray Bowel regimen: Scheduled Senokot, as needed MiraLAX  DVT prophylaxis: Currently on SCDs. Plavix  has been resumed. Per orthopedics, patient will maintain partial weightbearing for at least 6 weeks.  Chronic pain Was chronically on gabapentin  3 times daily. While on opioids, currently on gabapentin  nightly only  Acute postop blood loss anemia Hemoglobin down to 8 this morning.  Secondary to blood loss from fracture and surgery. No bleeding elsewhere. Repeat CBC this morning.  Transfuse if less than 7.  Recent Labs    06/29/22 1118 05/07/23 1538  05/07/23 1538 05/08/23 0347 05/09/23 0317 05/10/23 0329 05/11/23 0334  HGB 12.7 12.0  --  11.6* 10.5* 8.5* 8.0*  MCV 88 88.5   < > 91.2 92.2 90.3 91.9  VITAMINB12 530  --   --   --   --   --   --    < > = values in this interval not displayed.   Hypokalemia Potassium level improved with replacement Recent Labs  Lab 05/07/23 1538 05/08/23 0347 05/09/23 0702 05/10/23 0329  K 3.4* 4.4 4.1 3.7   Chronic diastolic CHF Hypertension Currently compensated CHF PTA meds-Toprol  25 mg daily, Lasix  20 mg daily Continue metoprolol .  Continue to hold Lasix .    CAD, HLD Continue metoprolol , Plavix , Lipitor  GERD Hiatal hernia PPI, Pepcid   Anxiety/depression Zoloft , bupropion     Mobility: PT eval postprocedure  Goals of care   Code Status: Full Code     DVT prophylaxis:  SCDs Start: 05/09/23 1304 Place TED hose Start: 05/09/23 1304 SCDs Start: 05/07/23 1859   Antimicrobials: None Fluid: None Consultants: Orthopedics Family Communication: Daughter at bedside  Status: Observation Level of care:  Med-Surg   Patient is from: Home Needs to continue in-hospital care: Pain not improving.  Requiring IV Dilaudid . Anticipated d/c to: SNF approved by insurance but unable to discharge to SNF today due to severe pain.  Hopefully over the weekend     Diet:  Diet Order             Diet regular Room service appropriate? Yes; Fluid consistency: Thin  Diet effective now  Scheduled Meds:  acetaminophen   1,000 mg Oral Q6H   atorvastatin   10 mg Oral QHS   buPROPion   300 mg Oral Daily   clopidogrel   75 mg Oral QPM   famotidine   40 mg Oral Daily   gabapentin   200 mg Oral QHS   methocarbamol   500 mg Oral TID   metoprolol  succinate  12.5 mg Oral QHS   oxyCODONE   5 mg Oral Q6H   pantoprazole   40 mg Oral Daily   polyethylene glycol  17 g Oral BID   senna  2 tablet Oral QHS   sertraline   100 mg Oral BID   sodium chloride  flush  3-10 mL Intravenous Q12H     PRN meds: alum & mag hydroxide-simeth, bisacodyl , diphenhydrAMINE , HYDROmorphone  (DILAUDID ) injection, loratadine , menthol -cetylpyridinium **OR** phenol, metoCLOPramide  **OR** metoCLOPramide  (REGLAN ) injection, ondansetron  **OR** ondansetron  (ZOFRAN ) IV, sodium chloride  flush   Infusions:     Antimicrobials: Anti-infectives (From admission, onward)    Start     Dose/Rate Route Frequency Ordered Stop   05/09/23 1600  ceFAZolin  (ANCEF ) IVPB 2g/100 mL premix        2 g 200 mL/hr over 30 Minutes Intravenous Every 6 hours 05/09/23 1304 05/09/23 2155   05/09/23 0830  ceFAZolin  (ANCEF ) IVPB 2g/100 mL premix        2 g 200 mL/hr over 30 Minutes Intravenous On call to O.R. 05/09/23 0741 05/09/23 1016       Objective: Vitals:   05/11/23 2039 05/12/23 0532  BP: (!) 102/50 (!) 125/50  Pulse: 77 81  Resp: 18 18  Temp: 98.5 F (36.9 C) 98 F (36.7 C)  SpO2: 100% 100%    Intake/Output Summary (Last 24 hours) at 05/12/2023 1043 Last data filed at 05/12/2023 0910 Gross per 24 hour  Intake 760 ml  Output 450 ml  Net 310 ml   Filed Weights   05/07/23 2007  Weight: 82.1 kg   Weight change:  Body mass index is 31.07 kg/m.   Physical Exam: General exam: Pleasant, elderly Caucasian female.  In distress.  The left hip pain Skin: No rashes, lesions or ulcers. HEENT: Atraumatic, normocephalic, no obvious bleeding Lungs: Clear to auscultation bilaterally,  CVS: S1, S2, no murmur,   GI/Abd: Soft, nontender, nondistended, bowel sound present,   CNS: Alert, awake, oriented x 3. Psychiatry: Sad affect Extremities: No pedal edema, no calf tenderness, left hip surgical site without evidence of bleeding  Data Review: I have personally reviewed the laboratory data and studies available.  F/u labs  Unresulted Labs (From admission, onward)     Start     Ordered   05/12/23 1006  CBC with Differential/Platelet  ONCE - STAT,   STAT       Question:  Specimen collection method  Answer:   Lab=Lab collect   05/12/23 1005           Total time spent in review of labs and imaging, patient evaluation, formulation of plan, documentation and communication with family: 45 minutes  Signed, Hoyt Macleod, MD [facility] 05/12/2023

## 2023-05-12 NOTE — Plan of Care (Signed)

## 2023-05-12 NOTE — TOC Progression Note (Signed)
 Transition of Care [facility]) - Progression Note    Patient Details  Name: [name] [name] MRN: [id number] Date of Birth: April 15, 1934  [facility] of [facility]) CM/SW Contact  Amaryllis Junior, [place] Phone Number: 05/12/2023, 3:19 PM  Clinical Narrative:    CSW met with pt and family at bedside. [facility] selected as bed choice. CSW confirmed with [place] at [facility] and pt is able to admit over weekend once medically ready.    Expected Discharge Plan:  (TBD) Barriers to Discharge: Continued Medical Work up  Expected Discharge Plan and Services In-house Referral: Clinical Social Work     Living arrangements for the past 2 months: Single Family Home                                       Social Determinants of Health (SDOH) Interventions SDOH Screenings   Food Insecurity: No Food Insecurity (05/07/2023)  Housing: Low Risk  (05/07/2023)  Transportation Needs: No Transportation Needs (05/07/2023)  Utilities: Not At Risk (05/07/2023)  Alcohol Screen: Low Risk  (05/17/2022)  Depression (PHQ2-9): High Risk (08/15/2022)  Financial Resource Strain: Low Risk  (05/17/2022)  Physical Activity: Inactive (05/17/2022)  Social Connections: Patient Declined (05/08/2023)  Stress: Stress Concern Present (05/17/2022)  Tobacco Use: Low Risk  (05/09/2023)    Readmission Risk Interventions    05/11/2023    9:43 AM  Readmission Risk Prevention Plan  Post Dischage Appt Complete  Medication Screening Complete  Transportation Screening Complete

## 2023-05-13 DIAGNOSIS — Z743 Need for continuous supervision: Secondary | ICD-10-CM | POA: Diagnosis not present

## 2023-05-13 DIAGNOSIS — M80052D Age-related osteoporosis with current pathological fracture, left femur, subsequent encounter for fracture with routine healing: Secondary | ICD-10-CM | POA: Diagnosis not present

## 2023-05-13 DIAGNOSIS — G4700 Insomnia, unspecified: Secondary | ICD-10-CM | POA: Diagnosis not present

## 2023-05-13 DIAGNOSIS — S72142D Displaced intertrochanteric fracture of left femur, subsequent encounter for closed fracture with routine healing: Secondary | ICD-10-CM | POA: Diagnosis not present

## 2023-05-13 DIAGNOSIS — Z96652 Presence of left artificial knee joint: Secondary | ICD-10-CM | POA: Diagnosis not present

## 2023-05-13 DIAGNOSIS — I10 Essential (primary) hypertension: Secondary | ICD-10-CM | POA: Diagnosis not present

## 2023-05-13 DIAGNOSIS — S72132D Displaced apophyseal fracture of left femur, subsequent encounter for closed fracture with routine healing: Secondary | ICD-10-CM | POA: Diagnosis not present

## 2023-05-13 DIAGNOSIS — I5032 Chronic diastolic (congestive) heart failure: Secondary | ICD-10-CM | POA: Diagnosis not present

## 2023-05-13 DIAGNOSIS — G500 Trigeminal neuralgia: Secondary | ICD-10-CM | POA: Diagnosis not present

## 2023-05-13 DIAGNOSIS — J301 Allergic rhinitis due to pollen: Secondary | ICD-10-CM | POA: Diagnosis not present

## 2023-05-13 DIAGNOSIS — K219 Gastro-esophageal reflux disease without esophagitis: Secondary | ICD-10-CM | POA: Diagnosis not present

## 2023-05-13 DIAGNOSIS — R2681 Unsteadiness on feet: Secondary | ICD-10-CM | POA: Diagnosis not present

## 2023-05-13 DIAGNOSIS — E785 Hyperlipidemia, unspecified: Secondary | ICD-10-CM | POA: Diagnosis not present

## 2023-05-13 DIAGNOSIS — I5031 Acute diastolic (congestive) heart failure: Secondary | ICD-10-CM | POA: Diagnosis not present

## 2023-05-13 DIAGNOSIS — M6281 Muscle weakness (generalized): Secondary | ICD-10-CM | POA: Diagnosis not present

## 2023-05-13 DIAGNOSIS — F32A Depression, unspecified: Secondary | ICD-10-CM | POA: Diagnosis not present

## 2023-05-13 DIAGNOSIS — F419 Anxiety disorder, unspecified: Secondary | ICD-10-CM | POA: Diagnosis not present

## 2023-05-13 DIAGNOSIS — M25562 Pain in left knee: Secondary | ICD-10-CM | POA: Diagnosis not present

## 2023-05-13 DIAGNOSIS — H6190 Disorder of external ear, unspecified, unspecified ear: Secondary | ICD-10-CM | POA: Diagnosis not present

## 2023-05-13 DIAGNOSIS — F331 Major depressive disorder, recurrent, moderate: Secondary | ICD-10-CM | POA: Diagnosis not present

## 2023-05-13 DIAGNOSIS — H73011 Bullous myringitis, right ear: Secondary | ICD-10-CM | POA: Diagnosis not present

## 2023-05-13 DIAGNOSIS — R350 Frequency of micturition: Secondary | ICD-10-CM | POA: Diagnosis not present

## 2023-05-13 DIAGNOSIS — S72142A Displaced intertrochanteric fracture of left femur, initial encounter for closed fracture: Secondary | ICD-10-CM | POA: Diagnosis not present

## 2023-05-13 DIAGNOSIS — R1314 Dysphagia, pharyngoesophageal phase: Secondary | ICD-10-CM | POA: Diagnosis not present

## 2023-05-13 DIAGNOSIS — H3530 Unspecified macular degeneration: Secondary | ICD-10-CM | POA: Diagnosis not present

## 2023-05-13 DIAGNOSIS — N390 Urinary tract infection, site not specified: Secondary | ICD-10-CM | POA: Diagnosis not present

## 2023-05-13 DIAGNOSIS — M129 Arthropathy, unspecified: Secondary | ICD-10-CM | POA: Diagnosis not present

## 2023-05-13 DIAGNOSIS — R5383 Other fatigue: Secondary | ICD-10-CM | POA: Diagnosis not present

## 2023-05-13 DIAGNOSIS — F411 Generalized anxiety disorder: Secondary | ICD-10-CM | POA: Diagnosis not present

## 2023-05-13 DIAGNOSIS — R0781 Pleurodynia: Secondary | ICD-10-CM | POA: Diagnosis not present

## 2023-05-13 DIAGNOSIS — M25559 Pain in unspecified hip: Secondary | ICD-10-CM | POA: Diagnosis not present

## 2023-05-13 DIAGNOSIS — Z4789 Encounter for other orthopedic aftercare: Secondary | ICD-10-CM | POA: Diagnosis not present

## 2023-05-13 DIAGNOSIS — M810 Age-related osteoporosis without current pathological fracture: Secondary | ICD-10-CM | POA: Diagnosis not present

## 2023-05-13 DIAGNOSIS — S72002A Fracture of unspecified part of neck of left femur, initial encounter for closed fracture: Secondary | ICD-10-CM | POA: Diagnosis not present

## 2023-05-13 DIAGNOSIS — R278 Other lack of coordination: Secondary | ICD-10-CM | POA: Diagnosis not present

## 2023-05-13 DIAGNOSIS — G629 Polyneuropathy, unspecified: Secondary | ICD-10-CM | POA: Diagnosis not present

## 2023-05-13 DIAGNOSIS — H6123 Impacted cerumen, bilateral: Secondary | ICD-10-CM | POA: Diagnosis not present

## 2023-05-13 DIAGNOSIS — I25119 Atherosclerotic heart disease of native coronary artery with unspecified angina pectoris: Secondary | ICD-10-CM | POA: Diagnosis not present

## 2023-05-13 MED ORDER — METHOCARBAMOL 500 MG PO TABS
500.0000 mg | ORAL_TABLET | Freq: Four times a day (QID) | ORAL | Status: DC
  Administered 2023-05-13: 500 mg via ORAL
  Filled 2023-05-13: qty 1

## 2023-05-13 MED ORDER — OXYCODONE HCL 5 MG PO TABS: 5.0000 mg | ORAL_TABLET | Freq: Four times a day (QID) | ORAL | Status: AC

## 2023-05-13 MED ORDER — OXYCODONE HCL 5 MG PO TABS
5.0000 mg | ORAL_TABLET | Freq: Four times a day (QID) | ORAL | Status: DC
  Administered 2023-05-13: 5 mg via ORAL
  Filled 2023-05-13: qty 1

## 2023-05-13 MED ORDER — GABAPENTIN 100 MG PO CAPS: 200.0000 mg | ORAL_CAPSULE | Freq: Every day | ORAL | Status: DC

## 2023-05-13 MED ORDER — ACETAMINOPHEN 500 MG PO TABS
1000.0000 mg | ORAL_TABLET | Freq: Three times a day (TID) | ORAL | Status: DC
  Filled 2023-05-13: qty 2

## 2023-05-13 MED ORDER — SENNA 8.6 MG PO TABS: 2.0000 | ORAL_TABLET | Freq: Every day | ORAL | Status: AC

## 2023-05-13 MED ORDER — POLYETHYLENE GLYCOL 3350 17 G PO PACK: 17.0000 g | PACK | Freq: Every day | ORAL | Status: DC | PRN

## 2023-05-13 MED ORDER — ALUM & MAG HYDROXIDE-SIMETH 200-200-20 MG/5ML PO SUSP: 30.0000 mL | ORAL | Status: DC | PRN

## 2023-05-13 MED ORDER — METHOCARBAMOL 500 MG PO TABS: 500.0000 mg | ORAL_TABLET | Freq: Four times a day (QID) | ORAL | Status: DC

## 2023-05-13 MED ORDER — MELATONIN 3 MG PO TABS: 3.0000 mg | ORAL_TABLET | Freq: Every evening | ORAL | Status: AC | PRN

## 2023-05-13 MED ORDER — OXYCODONE HCL 5 MG PO TABS: 5.0000 mg | ORAL_TABLET | Freq: Four times a day (QID) | ORAL | Status: DC | PRN

## 2023-05-13 MED ORDER — ACETAMINOPHEN 500 MG PO TABS: 1000.0000 mg | ORAL_TABLET | Freq: Three times a day (TID) | ORAL | Status: AC

## 2023-05-13 NOTE — Progress Notes (Signed)
 Patient assisted from bed to chair; required mod to max assist with sit to stand and pivot to chair. Transferred to to chair safely with 2 person assist. Sitting up in chair and currently denies any pain; no complaints. ICE reapplied to left hip. Family in room.

## 2023-05-13 NOTE — TOC Progression Note (Addendum)
 Transition of Care [facility]) - Progression Note    Patient Details  Name: [name] [name] MRN: [id number] Date of Birth: 1934/04/04  Transition of Care [facility]) CM/SW Contact  Katrine Parody, LCSW Phone Number: 05/13/2023, 10:02 AM  Clinical Narrative:    DO signed CSW contacted selected facility, VM left.  CSW will complete Medical Necessity, update pt th DC to her chosen facility may be as soon as today and  for to prepare for DC/Transport. TOC to follow.  CSW visited pt in room she was sitting in her chair, let her know that I am reaching out to [facility] to facilitate her transportation today and the RN would follow up and assits with getting ready once we have confirmation.   CSW received call from [place] at [facility] pt can admit today.  RR info provided, CSW then [facility] to RN with RR info.  DC packet prepared, [facility]AR contacted for transport.  No other TOC needs.   Expected Discharge Plan:  (TBD) Barriers to Discharge: Continued Medical Work up  Expected Discharge Plan and Services In-house Referral: Clinical Social Work     Living arrangements for the past 2 months: Single Family Home Expected Discharge Date: 05/13/23                                     Social Determinants of Health (SDOH) Interventions SDOH Screenings   Food Insecurity: No Food Insecurity (05/07/2023)  Housing: Low Risk  (05/07/2023)  Transportation Needs: No Transportation Needs (05/07/2023)  Utilities: Not At Risk (05/07/2023)  Alcohol Screen: Low Risk  (05/17/2022)  Depression (PHQ2-9): High Risk (08/15/2022)  Financial Resource Strain: Low Risk  (05/17/2022)  Physical Activity: Inactive (05/17/2022)  Social Connections: Patient Declined (05/08/2023)  Stress: Stress Concern Present (05/17/2022)  Tobacco Use: Low Risk  (05/09/2023)    Readmission Risk Interventions    05/11/2023    9:43 AM  Readmission Risk Prevention Plan  Post Dischage Appt Complete  Medication Screening Complete   Transportation Screening Complete

## 2023-05-13 NOTE — Discharge Summary (Signed)
 Physician Discharge Summary  [name] [name] WJX:[id number] DOB: 12-29-1934 DOA: 05/07/2023  PCP: Clemens Curt, MD  Admit date: 05/07/2023 Discharge date: 05/13/2023  Admitted From: Home Discharge disposition: [facility]  Recommendations at discharge:  Cautious use of pain medicines:  At discharge, I would continue scheduled Tylenol  1 g 3 times daily, scheduled Robaxin  500 mg 4 times daily.  For the next few days, patient and family requested scheduled oxycodone  5 mg every 6 hours.  Patient seems to be tolerating it well.  I have advised them to watch her mental status and need to wean down in the next 1 to 2 days to avoid overdose. Previously on gabapentin  100 mg twice daily.  Can continue the same or take 200 mg nightly at patient's preference. Per orthopedics, maintain partial weightbearing for at least 6 weeks.   Follow-up with orthopedics as an outpatient   Brief narrative: [name] [name] is a 89 y.o. female with PMH significant for HTN, HLD, CAD, CHF, GERD, chronic pain, anxiety/depression 4/20, patient was brought to the ED after a mechanical fall landing on her left side, sustained severe left hip pain Imaging in the ED showed intertrochanteric femur fracture Orthopedics was consulted Admitted to [facility] 4/22 -underwent intramedullary rod placement in left femur by Dr. Bernard Brick  Subjective: Patient was seen and examined this morning. Lying down in bed.  Pain and spasms better.  Could not fall asleep last night.  Family at bedside.  Overall improving status.  Patient and family are comfortable with the plan to discharge to SNF today.  Hospital course: Left intertrochanteric femur fracture S/p IM rod  -4/22 Dr. Bernard Brick Fractures secondary to secondary to mechanical fall Imaging and procedure as above. Patient had issues with adequate pain control.  She also has history of chronic pain.  Her pain medicines were revised yesterday.  Also obtained x-ray of left knee with did not  show any new remarkable finding.   Pain and spasm control is better today. At discharge, I would continue scheduled Tylenol  1 g 3 times daily, scheduled Robaxin  500 mg 4 times daily.  For the next few days, patient and family requested scheduled oxycodone  5 mg every 6 hours.  Patient seems to be tolerating it well.  I have advised them to watch her mental status and need to wean down in the next 1 to 2 days to avoid overdose. Previously on gabapentin  100 mg twice daily.  Can continue the same or take 200 mg nightly at patient's preference Bowel regimen: Scheduled Senokot, as needed MiraLAX  DVT prophylaxis: To continue Plavix  as before Per orthopedics, patient will maintain partial weightbearing for at least 6 weeks. Follow-up with orthopedics as an outpatient PT well obtained.  SNF recommended.  Acute postop blood loss anemia Hemoglobin dipped down to the lowest of 8 on 4/24.  Improved on subsequent blood work.  No active bleeding drop in hemoglobin due to blood loss from fracture and surgery. Continue to monitor as an outpatient Recent Labs    06/29/22 1118 05/07/23 1538 05/08/23 0347 05/09/23 0317 05/10/23 0329 05/11/23 0334 05/12/23 0315  HGB 12.7   < > 11.6* 10.5* 8.5* 8.0* 8.2*  MCV 88   < > 91.2 92.2 90.3 91.9 92.0  VITAMINB12 530  --   --   --   --   --   --    < > = values in this interval not displayed.   Hypokalemia Potassium level improved with replacement Recent Labs  Lab 05/07/23  1538 05/08/23 0347 05/09/23 0702 05/10/23 0329  K 3.4* 4.4 4.1 3.7   Chronic diastolic CHF Hypertension Currently compensated CHF Continue Toprol  25 mg daily, Lasix  20 mg daily  CAD, HLD Continue metoprolol , Plavix , Lipitor  GERD Hiatal hernia Continue PPI, Pepcid  and as needed Mylanta  Anxiety/depression Zoloft , bupropion   Insomnia Melatonin as needed  Goals of care   Code Status: Full Code   Diet:  Diet Order             Diet general           Diet regular Room  service appropriate? Yes; Fluid consistency: Thin  Diet effective now                   Nutritional status:  Body mass index is 31.07 kg/m.       Wounds:  - Incision (Closed) 05/09/23 Hip Proximal;Right (Active)  Date First Assessed/Time First Assessed: 05/09/23 1017   Location: Hip  Location Orientation: Proximal;Right    No assessment data to display     No associated orders.     Incision (Closed) 05/09/23 Hip Left (Active)  Date First Assessed/Time First Assessed: 05/09/23 1101   Location: Hip  Location Orientation: Left    Assessments 05/09/2023 11:23 AM 05/12/2023 10:00 PM  Dressing Type Silver hydrofiber Silver hydrofiber  Dressing Clean, Dry, Intact Clean, Dry, Intact  Site / Wound Assessment Dressing in place / Unable to assess Dressing in place / Unable to assess  Drainage Amount None None  Treatment Ice applied --     No associated orders.    Discharge Exam:   Vitals:   05/12/23 0532 05/12/23 1346 05/12/23 2133 05/13/23 0558  BP: (!) 125/50 (!) 116/58 118/68 126/60  Pulse: 81 86 97 82  Resp: 18 17 16 17   Temp: 98 F (36.7 C) 97.9 F (36.6 C) 98.7 F (37.1 C) 98.1 F (36.7 C)  TempSrc: Oral Oral Oral Oral  SpO2: 100% 100% 99% 98%  Weight:      Height:        Body mass index is 31.07 kg/m.  General exam: Pleasant, elderly Caucasian female.  Pain control is better Skin: No rashes, lesions or ulcers. HEENT: Atraumatic, normocephalic, no obvious bleeding Lungs: Clear to auscultation bilaterally,  CVS: S1, S2, no murmur,   GI/Abd: Soft, nontender, nondistended, bowel sound present,   CNS: Alert, awake, oriented x 3. Psychiatry: Mood appropriate Extremities: No pedal edema, no calf tenderness, left hip surgical site without evidence of bleeding  Follow ups:    Contact information for follow-up providers     Claiborne Crew, MD. Schedule an appointment as soon as possible for a visit in 2 week(s).   Specialty: Orthopedic Surgery Contact  information: [address] [place] [address] [phone]         Clemens Curt, MD Follow up.   Specialties: Family Medicine, Radiology Contact information: [address] [place] [address] [phone]              Contact information for after-discharge care     Destination     [facility] .   Service: Skilled Nursing Contact information: [address] [name]  [address] [phone]                     Discharge Instructions:   Discharge Instructions     Call MD for:  difficulty breathing, headache or visual disturbances   Complete  by: As directed    Call MD for:  extreme fatigue   Complete by: As directed    Call MD for:  hives   Complete by: As directed    Call MD for:  persistant dizziness or light-headedness   Complete by: As directed    Call MD for:  persistant nausea and vomiting   Complete by: As directed    Call MD for:  severe uncontrolled pain   Complete by: As directed    Call MD for:  temperature >100.4   Complete by: As directed    Diet general   Complete by: As directed    Discharge instructions   Complete by: As directed    Recommendations at discharge:   Cautious use of pain medicines:  At discharge, I would continue scheduled Tylenol  1 g 3 times daily, scheduled Robaxin  500 mg 4 times daily.  For the next few days, patient and family requested scheduled oxycodone  5 mg every 6 hours.  Patient seems to be tolerating it well.  I have advised them to watch her mental status and need to wean down in the next 1 to 2 days to avoid overdose. Previously on gabapentin  100 mg twice daily.  Can continue the same or take 200 mg nightly at patient's preference.  Per orthopedics, maintain partial weightbearing for at least 6 weeks.    Follow-up with orthopedics as an outpatient  PDMP reviewed this encounter.   Opioid taper instructions: It is important to wean off of  your opioid medication as soon as possible. If you do not need pain medication after your surgery it is ok to stop day one. Opioids include: Codeine, Hydrocodone (Norco, Vicodin), Oxycodone (Percocet, oxycontin ) and hydromorphone  amongst others.  Long term and even short term use of opiods can cause: Increased pain response Dependence Constipation Depression Respiratory depression And more.  Withdrawal symptoms can include Flu like symptoms Nausea, vomiting And more Techniques to manage these symptoms Hydrate well Eat regular healthy meals Stay active Use relaxation techniques(deep breathing, meditating, yoga) Do Not substitute Alcohol to help with tapering If you have been on opioids for less than two weeks and do not have pain than it is ok to stop all together.  Plan to wean off of opioids This plan should start within one week post op of your joint replacement. Maintain the same interval or time between taking each dose and first decrease the dose.  Cut the total daily intake of opioids by one tablet each day Next start to increase the time between doses. The last dose that should be eliminated is the evening dose.        General discharge instructions: Follow with Primary MD Tower, Manley Seeds, MD in 7 days  Please request your PCP  to go over your hospital tests, procedures, radiology results at the follow up. Please get your medicines reviewed and adjusted.  Your PCP may decide to repeat certain labs or tests as needed. Do not drive, operate heavy machinery, perform activities at heights, swimming or participation in water  activities or provide baby sitting services if your were admitted for syncope or siezures until you have seen by Primary MD or a Neurologist and advised to do so again. [place]  Controlled Substance Reporting System database was reviewed. Do not drive, operate heavy machinery, perform activities at heights, swim, participate in water  activities or provide  baby-sitting services while on medications for pain, sleep and mood until your outpatient physician has reevaluated you and advised to do  so again.  You are strongly recommended to comply with the dose, frequency and duration of prescribed medications. Activity: As tolerated with Full fall precautions use walker/cane & assistance as needed Avoid using any recreational substances like cigarette, tobacco, alcohol, or non-prescribed drug. If you experience worsening of your admission symptoms, develop shortness of breath, life threatening emergency, suicidal or homicidal thoughts you must seek medical attention immediately by calling 911 or calling your MD immediately  if symptoms less severe. You must read complete instructions/literature along with all the possible adverse reactions/side effects for all the medicines you take and that have been prescribed to you. Take any new medicine only after you have completely understood and accepted all the possible adverse reactions/side effects.  Wear Seat belts while driving. You were cared for by a hospitalist during your hospital stay. If you have any questions about your discharge medications or the care you received while you were in the hospital after you are discharged, you can call the unit and ask to speak with the hospitalist or the covering physician. Once you are discharged, your primary care physician will handle any further medical issues. Please note that NO REFILLS for any discharge medications will be authorized once you are discharged, as it is imperative that you return to your primary care physician (or establish a relationship with a primary care physician if you do not have one).   Increase activity slowly   Complete by: As directed        Discharge Medications:   Allergies as of 05/13/2023       Reactions   Alendronate Sodium Other (See Comments)   JAW PAIN   Celecoxib Other (See Comments)   GI UPSET   Chocolate Diarrhea    Omeprazole Nausea Only   Rabeprazole Nausea And Vomiting   Latex Itching, Dermatitis, Rash   Sulfa  Antibiotics Rash, Dermatitis        Medication List     STOP taking these medications    doxycycline  100 MG tablet Commonly known as: VIBRA -TABS   HYDROcodone -acetaminophen  5-325 MG tablet Commonly known as: NORCO/VICODIN   predniSONE  10 MG tablet Commonly known as: DELTASONE        TAKE these medications    ABSORBINE ARTHRITIS STRENGTH EX Apply 1 application  topically daily as needed (Back pain).   acetaminophen  500 MG tablet Commonly known as: TYLENOL  Take 2 tablets (1,000 mg total) by mouth every 8 (eight) hours.   alum & mag hydroxide-simeth 200-200-20 MG/5ML suspension Commonly known as: MAALOX/MYLANTA Take 30 mLs by mouth every 4 (four) hours as needed for indigestion.   APPLE CIDER VINEGAR PO Take 2 tablets by mouth See admin instructions. Chew 2 gummies by mouth at bedtime   atorvastatin  10 MG tablet Commonly known as: LIPITOR TAKE 1 TABLET DAILY What changed:  how much to take how to take this when to take this additional instructions   buPROPion  150 MG 24 hr tablet Commonly known as: WELLBUTRIN  XL TAKE 2 TABLETS DAILY What changed:  how much to take when to take this   calcium  carbonate 500 MG chewable tablet Commonly known as: TUMS - dosed in mg elemental calcium  Chew 1 tablet (200 mg of elemental calcium  total) by mouth 4 (four) times daily as needed for indigestion or heartburn.   Claritin  5 MG/5ML syrup Generic drug: loratadine  Take 10 mg by mouth See admin instructions. Take 10 mg by mouth in the morning for itching that spans the length of the back   clopidogrel  75  MG tablet Commonly known as: PLAVIX  Take 1 tablet (75 mg total) by mouth daily. What changed: when to take this   diclofenac sodium 1 % Gel Commonly known as: VOLTAREN Apply 2-4 g topically at bedtime as needed (for pain- affected areas).   estradiol  0.1 MG/GM vaginal  cream Commonly known as: ESTRACE  APPLY A PEA SIZED AMOUNT OF CREAM VAGINALLY TWICE A WEEK   famotidine  40 MG tablet Commonly known as: PEPCID  TAKE 1 TABLET DAILY What changed: when to take this   furosemide  20 MG tablet Commonly known as: LASIX  TAKE 1 TABLET DAILY What changed:  how much to take how to take this when to take this additional instructions   gabapentin  100 MG capsule Commonly known as: NEURONTIN  Take 2 capsules (200 mg total) by mouth at bedtime. What changed: See the new instructions.   Imodium A-D 2 MG tablet Generic drug: loperamide Take 2 mg by mouth 4 (four) times daily as needed for diarrhea or loose stools.   melatonin 3 MG Tabs tablet Take 1 tablet (3 mg total) by mouth at bedtime as needed.   methocarbamol  500 MG tablet Commonly known as: ROBAXIN  Take 1 tablet (500 mg total) by mouth 4 (four) times daily. Take it scheduled for next 3 to 5 days and change to as needed   metoprolol  succinate 25 MG 24 hr tablet Commonly known as: TOPROL -XL TAKE ONE-HALF (1/2) TABLET AT BEDTIME   nitroGLYCERIN  0.4 MG SL tablet Commonly known as: NITROSTAT  Place 1 tablet (0.4 mg total) under the tongue every 5 (five) minutes as needed for chest pain.   oxyCODONE  5 MG immediate release tablet Commonly known as: Oxy IR/ROXICODONE  Take 1 tablet (5 mg total) by mouth every 6 (six) hours for 3 days. Take scheduled for next 3 days and changed to as needed.  Avoid long-term scheduled opioids which could lead to overdose.   pantoprazole  40 MG tablet Commonly known as: PROTONIX  Take 1 tablet (40 mg total) by mouth 2 (two) times daily. What changed:  when to take this reasons to take this   polyethylene glycol 17 g packet Commonly known as: MIRALAX  / GLYCOLAX  Take 17 g by mouth daily as needed.   senna 8.6 MG Tabs tablet Commonly known as: SENOKOT Take 2 tablets (17.2 mg total) by mouth at bedtime.   sertraline  100 MG tablet Commonly known as: ZOLOFT  TAKE 2  TABLETS DAILY What changed:  how much to take when to take this   SYSTANE OP Place 1-2 drops into both eyes 3 (three) times daily as needed (for dryness).         The results of significant diagnostics from this hospitalization (including imaging, microbiology, ancillary and laboratory) are listed below for reference.    Procedures and Diagnostic Studies:   DG FEMUR MIN 2 VIEWS LEFT Result Date: 05/07/2023 CLINICAL DATA:  Pain after fall. EXAM: LEFT FEMUR 2 VIEWS; PELVIS - 1-2 VIEW COMPARISON:  None Available. FINDINGS: Comminuted and displaced intertrochanteric femur fracture. Apex lateral angulation. Proximal migration of the femoral shaft. There is involvement of both lesser and greater trochanters. The distal femur is intact. No additional femur fracture. Pelvis: Intact bony pelvis. Pubic rami are intact. No pelvic fracture. Lumbosacral fusion hardware is intact were visualized. There is mild bilateral hip osteoarthritis IMPRESSION: 1. Comminuted and displaced intertrochanteric left femur fracture. 2. No pelvic fracture. Electronically Signed   By: Chadwick Colonel M.D.   On: 05/07/2023 17:37   DG Pelvis 1-2 Views Result Date: 05/07/2023 CLINICAL  DATA:  Pain after fall. EXAM: LEFT FEMUR 2 VIEWS; PELVIS - 1-2 VIEW COMPARISON:  None Available. FINDINGS: Comminuted and displaced intertrochanteric femur fracture. Apex lateral angulation. Proximal migration of the femoral shaft. There is involvement of both lesser and greater trochanters. The distal femur is intact. No additional femur fracture. Pelvis: Intact bony pelvis. Pubic rami are intact. No pelvic fracture. Lumbosacral fusion hardware is intact were visualized. There is mild bilateral hip osteoarthritis IMPRESSION: 1. Comminuted and displaced intertrochanteric left femur fracture. 2. No pelvic fracture. Electronically Signed   By: Chadwick Colonel M.D.   On: 05/07/2023 17:37   DG Tibia/Fibula Left Result Date: 05/07/2023 CLINICAL DATA:   Pain after fall. EXAM: LEFT TIBIA AND FIBULA - 2 VIEW COMPARISON:  Knee radiograph 07/01/2014. FINDINGS: No evidence of acute fracture or dislocation. Assessment of the upper aspect of the tibia and fibula is limited due to persistent flexion. Knee arthroplasty is intact were visualized. There is a small joint effusion. Patellar Margeret Sheer is a chronic finding when compared to remote prior exam. IMPRESSION: 1. No acute fracture or dislocation of the left tibia or fibula. 2. Knee arthroplasty. Small knee joint effusion. Electronically Signed   By: Chadwick Colonel M.D.   On: 05/07/2023 17:36     Labs:   Basic Metabolic Panel: Recent Labs  Lab 05/07/23 1538 05/08/23 0347 05/09/23 0702 05/10/23 0329  NA 140 140 133* 136  K 3.4* 4.4 4.1 3.7  CL 105 104 99 103  CO2 26 27 27 27   GLUCOSE 118* 148* 135* 129*  BUN 21 22 30* 23  CREATININE 0.97 1.27* 1.06* 0.71  CALCIUM  8.3* 9.1 8.5* 8.6*   GFR Estimated Creatinine Clearance: 52.4 mL/min (by C-G formula based on SCr of 0.71 mg/dL). Liver Function Tests: No results for input(s): "AST", "ALT", "ALKPHOS", "BILITOT", "PROT", "ALBUMIN" in the last 168 hours. No results for input(s): "LIPASE", "AMYLASE" in the last 168 hours. No results for input(s): "AMMONIA" in the last 168 hours. Coagulation profile Recent Labs  Lab 05/07/23 1538  INR 1.0    CBC: Recent Labs  Lab 05/07/23 1538 05/08/23 0347 05/09/23 0317 05/10/23 0329 05/11/23 0334 05/12/23 0315  WBC 8.9 9.9 8.4 6.0 6.5 6.3  NEUTROABS 6.2  --  6.2  --   --  3.5  HGB 12.0 11.6* 10.5* 8.5* 8.0* 8.2*  HCT 37.8 38.2 34.4* 26.9* 26.0* 26.4*  MCV 88.5 91.2 92.2 90.3 91.9 92.0  PLT 257 272 217 195 218 272   Cardiac Enzymes: No results for input(s): "CKTOTAL", "CKMB", "CKMBINDEX", "TROPONINI" in the last 168 hours. BNP: Invalid input(s): "POCBNP" CBG: No results for input(s): "GLUCAP" in the last 168 hours. D-Dimer No results for input(s): "DDIMER" in the last 72 hours. Hgb A1c No  results for input(s): "HGBA1C" in the last 72 hours. Lipid Profile No results for input(s): "CHOL", "HDL", "LDLCALC", "TRIG", "CHOLHDL", "LDLDIRECT" in the last 72 hours. Thyroid  function studies No results for input(s): "TSH", "T4TOTAL", "T3FREE", "THYROIDAB" in the last 72 hours.  Invalid input(s): "FREET3" Anemia work up No results for input(s): "VITAMINB12", "FOLATE", "FERRITIN", "TIBC", "IRON ", "RETICCTPCT" in the last 72 hours. Microbiology Recent Results (from the past 240 hours)  Surgical PCR screen     Status: None   Collection Time: 05/08/23  4:05 AM   Specimen: Nasal Mucosa; Nasal Swab  Result Value Ref Range Status   MRSA, PCR NEGATIVE NEGATIVE Final   Staphylococcus aureus NEGATIVE NEGATIVE Final    Comment: (NOTE) The Xpert SA Assay (FDA approved for  NASAL specimens in patients 26 years of age and older), is one component of a comprehensive surveillance program. It is not intended to diagnose infection nor to guide or monitor treatment. Performed at [facility] [name], [address], [place], [place] [address]     Time coordinating discharge: 45 minutes  Signed: Aminta Baldy Srihitha Tagliaferri  [facility] 05/13/2023, 9:25 AM

## 2023-05-13 NOTE — Plan of Care (Signed)

## 2023-05-13 NOTE — Progress Notes (Signed)
 Report given to Raina LPN a Nursing home: [facility]; Nursing home LPN verbalized understanding of report.

## 2023-05-16 ENCOUNTER — Other Ambulatory Visit: Payer: Self-pay | Admitting: *Deleted

## 2023-05-16 NOTE — Patient Outreach (Signed)
 Mrs. [name] [name] recently admitted to [facility].  Screening for potential complex care management services as benefit of health plan and primary care provider.  Secure communication sent to [facility] social worker to make aware writer is following for transition plans and potential care management needs.   Nolberto Batty, MSN, RN, BSN [place]  [facility] RN Post- Acute Care Manager Direct Dial: [phone]

## 2023-05-18 DIAGNOSIS — I10 Essential (primary) hypertension: Secondary | ICD-10-CM | POA: Diagnosis not present

## 2023-05-18 DIAGNOSIS — S72142D Displaced intertrochanteric fracture of left femur, subsequent encounter for closed fracture with routine healing: Secondary | ICD-10-CM | POA: Diagnosis not present

## 2023-05-18 DIAGNOSIS — G629 Polyneuropathy, unspecified: Secondary | ICD-10-CM | POA: Diagnosis not present

## 2023-05-18 DIAGNOSIS — I5032 Chronic diastolic (congestive) heart failure: Secondary | ICD-10-CM | POA: Diagnosis not present

## 2023-05-18 DIAGNOSIS — M80052D Age-related osteoporosis with current pathological fracture, left femur, subsequent encounter for fracture with routine healing: Secondary | ICD-10-CM | POA: Diagnosis not present

## 2023-05-18 DIAGNOSIS — E785 Hyperlipidemia, unspecified: Secondary | ICD-10-CM | POA: Diagnosis not present

## 2023-05-23 DIAGNOSIS — M810 Age-related osteoporosis without current pathological fracture: Secondary | ICD-10-CM | POA: Diagnosis not present

## 2023-05-23 DIAGNOSIS — G629 Polyneuropathy, unspecified: Secondary | ICD-10-CM | POA: Diagnosis not present

## 2023-05-23 DIAGNOSIS — G500 Trigeminal neuralgia: Secondary | ICD-10-CM | POA: Diagnosis not present

## 2023-05-26 DIAGNOSIS — R0781 Pleurodynia: Secondary | ICD-10-CM | POA: Diagnosis not present

## 2023-05-26 DIAGNOSIS — Z4789 Encounter for other orthopedic aftercare: Secondary | ICD-10-CM | POA: Diagnosis not present

## 2023-05-26 DIAGNOSIS — S72142A Displaced intertrochanteric fracture of left femur, initial encounter for closed fracture: Secondary | ICD-10-CM | POA: Diagnosis not present

## 2023-05-29 DIAGNOSIS — F331 Major depressive disorder, recurrent, moderate: Secondary | ICD-10-CM | POA: Diagnosis not present

## 2023-05-29 DIAGNOSIS — F419 Anxiety disorder, unspecified: Secondary | ICD-10-CM | POA: Diagnosis not present

## 2023-06-01 DIAGNOSIS — I10 Essential (primary) hypertension: Secondary | ICD-10-CM | POA: Diagnosis not present

## 2023-06-01 DIAGNOSIS — M80052D Age-related osteoporosis with current pathological fracture, left femur, subsequent encounter for fracture with routine healing: Secondary | ICD-10-CM | POA: Diagnosis not present

## 2023-06-01 DIAGNOSIS — E785 Hyperlipidemia, unspecified: Secondary | ICD-10-CM | POA: Diagnosis not present

## 2023-06-01 DIAGNOSIS — G629 Polyneuropathy, unspecified: Secondary | ICD-10-CM | POA: Diagnosis not present

## 2023-06-05 DIAGNOSIS — J301 Allergic rhinitis due to pollen: Secondary | ICD-10-CM | POA: Diagnosis not present

## 2023-06-12 DIAGNOSIS — H6123 Impacted cerumen, bilateral: Secondary | ICD-10-CM | POA: Diagnosis not present

## 2023-06-12 DIAGNOSIS — H73011 Bullous myringitis, right ear: Secondary | ICD-10-CM | POA: Diagnosis not present

## 2023-06-19 DIAGNOSIS — N390 Urinary tract infection, site not specified: Secondary | ICD-10-CM | POA: Diagnosis not present

## 2023-06-19 DIAGNOSIS — H6190 Disorder of external ear, unspecified, unspecified ear: Secondary | ICD-10-CM | POA: Diagnosis not present

## 2023-06-19 DIAGNOSIS — R350 Frequency of micturition: Secondary | ICD-10-CM | POA: Diagnosis not present

## 2023-06-21 DIAGNOSIS — Z96652 Presence of left artificial knee joint: Secondary | ICD-10-CM | POA: Diagnosis not present

## 2023-06-21 DIAGNOSIS — M25562 Pain in left knee: Secondary | ICD-10-CM | POA: Diagnosis not present

## 2023-06-21 DIAGNOSIS — S72142D Displaced intertrochanteric fracture of left femur, subsequent encounter for closed fracture with routine healing: Secondary | ICD-10-CM | POA: Diagnosis not present

## 2023-06-26 DIAGNOSIS — G4700 Insomnia, unspecified: Secondary | ICD-10-CM | POA: Diagnosis not present

## 2023-06-26 DIAGNOSIS — F331 Major depressive disorder, recurrent, moderate: Secondary | ICD-10-CM | POA: Diagnosis not present

## 2023-06-26 DIAGNOSIS — F411 Generalized anxiety disorder: Secondary | ICD-10-CM | POA: Diagnosis not present

## 2023-07-01 DIAGNOSIS — F32A Depression, unspecified: Secondary | ICD-10-CM | POA: Diagnosis not present

## 2023-07-01 DIAGNOSIS — H6123 Impacted cerumen, bilateral: Secondary | ICD-10-CM | POA: Diagnosis not present

## 2023-07-01 DIAGNOSIS — G500 Trigeminal neuralgia: Secondary | ICD-10-CM | POA: Diagnosis not present

## 2023-07-01 DIAGNOSIS — I110 Hypertensive heart disease with heart failure: Secondary | ICD-10-CM | POA: Diagnosis not present

## 2023-07-01 DIAGNOSIS — K219 Gastro-esophageal reflux disease without esophagitis: Secondary | ICD-10-CM | POA: Diagnosis not present

## 2023-07-01 DIAGNOSIS — Z7902 Long term (current) use of antithrombotics/antiplatelets: Secondary | ICD-10-CM | POA: Diagnosis not present

## 2023-07-01 DIAGNOSIS — M80052D Age-related osteoporosis with current pathological fracture, left femur, subsequent encounter for fracture with routine healing: Secondary | ICD-10-CM | POA: Diagnosis not present

## 2023-07-01 DIAGNOSIS — D62 Acute posthemorrhagic anemia: Secondary | ICD-10-CM | POA: Diagnosis not present

## 2023-07-01 DIAGNOSIS — Z9981 Dependence on supplemental oxygen: Secondary | ICD-10-CM | POA: Diagnosis not present

## 2023-07-01 DIAGNOSIS — Z9181 History of falling: Secondary | ICD-10-CM | POA: Diagnosis not present

## 2023-07-01 DIAGNOSIS — I2510 Atherosclerotic heart disease of native coronary artery without angina pectoris: Secondary | ICD-10-CM | POA: Diagnosis not present

## 2023-07-01 DIAGNOSIS — I5032 Chronic diastolic (congestive) heart failure: Secondary | ICD-10-CM | POA: Diagnosis not present

## 2023-07-01 DIAGNOSIS — E785 Hyperlipidemia, unspecified: Secondary | ICD-10-CM | POA: Diagnosis not present

## 2023-07-01 DIAGNOSIS — H3530 Unspecified macular degeneration: Secondary | ICD-10-CM | POA: Diagnosis not present

## 2023-07-01 DIAGNOSIS — H73011 Bullous myringitis, right ear: Secondary | ICD-10-CM | POA: Diagnosis not present

## 2023-07-01 DIAGNOSIS — K449 Diaphragmatic hernia without obstruction or gangrene: Secondary | ICD-10-CM | POA: Diagnosis not present

## 2023-07-01 DIAGNOSIS — G629 Polyneuropathy, unspecified: Secondary | ICD-10-CM | POA: Diagnosis not present

## 2023-07-01 DIAGNOSIS — F411 Generalized anxiety disorder: Secondary | ICD-10-CM | POA: Diagnosis not present

## 2023-07-01 DIAGNOSIS — G8929 Other chronic pain: Secondary | ICD-10-CM | POA: Diagnosis not present

## 2023-07-03 ENCOUNTER — Telehealth: Payer: Self-pay

## 2023-07-03 NOTE — Transitions of Care (Post Inpatient/ED Visit) (Signed)
   07/03/2023  Name: [name] [name] MRN: [id number] DOB: Mar 25, 1934  Today's TOC FU Call Status: Today's TOC FU Call Status:: Successful TOC FU Call Completed TOC FU Call Complete Date: 07/03/23 Patient's Name and Date of Birth confirmed.  Transition Care Management Follow-up Telephone Call Date of Discharge: 06/30/23 Discharge Facility: Other [facility]) Name of Other ([facility]) Discharge Facility: [facility] Type of Discharge: Inpatient Admission Primary Inpatient Discharge Diagnosis:: neuralgia How have you been since you were released from the hospital?: Better Any questions or concerns?: No  Items Reviewed: Did you receive and understand the discharge instructions provided?: Yes Medications obtained,verified, and reconciled?: Yes (Medications Reviewed) Any new allergies since your discharge?: No Dietary orders reviewed?: Yes Do you have support at home?: Yes People in Home [RPT]: child(ren), adult  Medications Reviewed Today: Medications Reviewed Today   Medications were not reviewed in this encounter     Home Care and Equipment/Supplies: Were Home Health Services Ordered?: Yes [facility] of [facility] Health Agency[facility] Has Agency set up a time to come to your home?: No Any new equipment or medical supplies ordered?: Yes Name of Medical supply agency?: unknown Were you able to get the equipment/medical supplies?: Yes Do you have any questions related to the use of the equipment/supplies?: No  Functional Questionnaire: Do you need assistance with bathing/showering or dressing?: Yes Do you need assistance with meal preparation?: Yes Do you need assistance with eating?: No Do you have difficulty maintaining continence: Yes Do you need assistance with getting out of bed/getting out of a chair/moving?: No Do you have difficulty managing or taking your medications?: Yes  Follow up appointments reviewed: PCP Follow-up appointment confirmed?: Yes Date of PCP  follow-up appointment?: 07/07/23 Follow-up Provider: [facility] Follow-up appointment confirmed?: Yes Date of Specialist follow-up appointment?: 08/16/23 Follow-Up Specialty Provider:: uro Do you need transportation to your follow-up appointment?: No Do you understand care options if your condition(s) worsen?: Yes-patient verbalized understanding    SIGNATURE Darrall Ellison, LPN [facility] Nurse Health Advisor Direct Dial [phone]

## 2023-07-04 ENCOUNTER — Ambulatory Visit: Admitting: Family Medicine

## 2023-07-04 DIAGNOSIS — G500 Trigeminal neuralgia: Secondary | ICD-10-CM | POA: Diagnosis not present

## 2023-07-04 DIAGNOSIS — M80052D Age-related osteoporosis with current pathological fracture, left femur, subsequent encounter for fracture with routine healing: Secondary | ICD-10-CM | POA: Diagnosis not present

## 2023-07-04 DIAGNOSIS — F32A Depression, unspecified: Secondary | ICD-10-CM | POA: Diagnosis not present

## 2023-07-04 DIAGNOSIS — I110 Hypertensive heart disease with heart failure: Secondary | ICD-10-CM | POA: Diagnosis not present

## 2023-07-04 DIAGNOSIS — I5032 Chronic diastolic (congestive) heart failure: Secondary | ICD-10-CM | POA: Diagnosis not present

## 2023-07-04 DIAGNOSIS — K219 Gastro-esophageal reflux disease without esophagitis: Secondary | ICD-10-CM | POA: Diagnosis not present

## 2023-07-05 DIAGNOSIS — G500 Trigeminal neuralgia: Secondary | ICD-10-CM | POA: Diagnosis not present

## 2023-07-05 DIAGNOSIS — K219 Gastro-esophageal reflux disease without esophagitis: Secondary | ICD-10-CM | POA: Diagnosis not present

## 2023-07-05 DIAGNOSIS — M80052D Age-related osteoporosis with current pathological fracture, left femur, subsequent encounter for fracture with routine healing: Secondary | ICD-10-CM | POA: Diagnosis not present

## 2023-07-05 DIAGNOSIS — I5032 Chronic diastolic (congestive) heart failure: Secondary | ICD-10-CM | POA: Diagnosis not present

## 2023-07-05 DIAGNOSIS — F32A Depression, unspecified: Secondary | ICD-10-CM | POA: Diagnosis not present

## 2023-07-05 DIAGNOSIS — I110 Hypertensive heart disease with heart failure: Secondary | ICD-10-CM | POA: Diagnosis not present

## 2023-07-07 ENCOUNTER — Ambulatory Visit: Payer: Self-pay | Admitting: Family Medicine

## 2023-07-07 ENCOUNTER — Encounter: Payer: Self-pay | Admitting: Family Medicine

## 2023-07-07 ENCOUNTER — Ambulatory Visit: Admitting: Family Medicine

## 2023-07-07 VITALS — BP 128/66 | HR 72 | Temp 97.5°F | Ht 64.0 in | Wt 180.5 lb

## 2023-07-07 DIAGNOSIS — Z7409 Other reduced mobility: Secondary | ICD-10-CM

## 2023-07-07 DIAGNOSIS — E782 Mixed hyperlipidemia: Secondary | ICD-10-CM

## 2023-07-07 DIAGNOSIS — E66811 Obesity, class 1: Secondary | ICD-10-CM

## 2023-07-07 DIAGNOSIS — I5032 Chronic diastolic (congestive) heart failure: Secondary | ICD-10-CM

## 2023-07-07 DIAGNOSIS — K219 Gastro-esophageal reflux disease without esophagitis: Secondary | ICD-10-CM

## 2023-07-07 DIAGNOSIS — Z6830 Body mass index (BMI) 30.0-30.9, adult: Secondary | ICD-10-CM

## 2023-07-07 DIAGNOSIS — G894 Chronic pain syndrome: Secondary | ICD-10-CM

## 2023-07-07 DIAGNOSIS — E559 Vitamin D deficiency, unspecified: Secondary | ICD-10-CM

## 2023-07-07 DIAGNOSIS — E6609 Other obesity due to excess calories: Secondary | ICD-10-CM | POA: Diagnosis not present

## 2023-07-07 DIAGNOSIS — F411 Generalized anxiety disorder: Secondary | ICD-10-CM

## 2023-07-07 DIAGNOSIS — M810 Age-related osteoporosis without current pathological fracture: Secondary | ICD-10-CM

## 2023-07-07 DIAGNOSIS — F4323 Adjustment disorder with mixed anxiety and depressed mood: Secondary | ICD-10-CM | POA: Diagnosis not present

## 2023-07-07 DIAGNOSIS — Z79899 Other long term (current) drug therapy: Secondary | ICD-10-CM

## 2023-07-07 DIAGNOSIS — G8929 Other chronic pain: Secondary | ICD-10-CM | POA: Insufficient documentation

## 2023-07-07 DIAGNOSIS — R7303 Prediabetes: Secondary | ICD-10-CM | POA: Diagnosis not present

## 2023-07-07 LAB — TSH: TSH: 2.69 u[IU]/mL (ref 0.35–5.50)

## 2023-07-07 LAB — LIPID PANEL
Cholesterol: 173 mg/dL (ref 0–200)
HDL: 71.4 mg/dL (ref >39.00)
LDL Cholesterol: 81 mg/dL (ref 0–99)
NonHDL: 101.48
Total CHOL/HDL Ratio: 2
Triglycerides: 102 mg/dL (ref 0.0–149.0)
VLDL: 20.4 mg/dL (ref 0.0–40.0)

## 2023-07-07 LAB — CBC WITH DIFFERENTIAL/PLATELET
Basophils Absolute: 0.1 10*3/uL (ref 0.0–0.1)
Basophils Relative: 1.1 % (ref 0.0–3.0)
Eosinophils Absolute: 0.5 10*3/uL (ref 0.0–0.7)
Eosinophils Relative: 7.5 % — ABNORMAL HIGH (ref 0.0–5.0)
HCT: 38.8 % (ref 36.0–46.0)
Hemoglobin: 12.4 g/dL (ref 12.0–15.0)
Lymphocytes Relative: 28.4 % (ref 12.0–46.0)
Lymphs Abs: 1.8 10*3/uL (ref 0.7–4.0)
MCHC: 31.9 g/dL (ref 30.0–36.0)
MCV: 81.8 fl (ref 78.0–100.0)
Monocytes Absolute: 0.6 10*3/uL (ref 0.1–1.0)
Monocytes Relative: 10 % (ref 3.0–12.0)
Neutro Abs: 3.4 10*3/uL (ref 1.4–7.7)
Neutrophils Relative %: 53 % (ref 43.0–77.0)
Platelets: 352 10*3/uL (ref 150.0–400.0)
RBC: 4.74 Mil/uL (ref 3.87–5.11)
RDW: 15.3 % (ref 11.5–15.5)
WBC: 6.3 10*3/uL (ref 4.0–10.5)

## 2023-07-07 LAB — VITAMIN B12: Vitamin B-12: 1177 pg/mL — ABNORMAL HIGH (ref 211–911)

## 2023-07-07 LAB — COMPREHENSIVE METABOLIC PANEL WITH GFR
ALT: 15 U/L (ref 0–35)
AST: 20 U/L (ref 0–37)
Albumin: 4.2 g/dL (ref 3.5–5.2)
Alkaline Phosphatase: 150 U/L — ABNORMAL HIGH (ref 39–117)
BUN: 16 mg/dL (ref 6–23)
CO2: 31 meq/L (ref 19–32)
Calcium: 9.7 mg/dL (ref 8.4–10.5)
Chloride: 102 meq/L (ref 96–112)
Creatinine, Ser: 0.85 mg/dL (ref 0.40–1.20)
GFR: 62.01 mL/min (ref >60.00)
Glucose, Bld: 103 mg/dL — ABNORMAL HIGH (ref 70–99)
Potassium: 4.3 meq/L (ref 3.5–5.1)
Sodium: 142 meq/L (ref 135–145)
Total Bilirubin: 0.4 mg/dL (ref 0.2–1.2)
Total Protein: 6.8 g/dL (ref 6.0–8.3)

## 2023-07-07 LAB — VITAMIN D 25 HYDROXY (VIT D DEFICIENCY, FRACTURES): VITD: 63.45 ng/mL (ref 30.00–100.00)

## 2023-07-07 LAB — HEMOGLOBIN A1C: Hgb A1c MFr Bld: 5.7 % (ref 4.6–6.5)

## 2023-07-07 MED ORDER — METHOCARBAMOL 500 MG PO TABS: 500.0000 mg | ORAL_TABLET | Freq: Three times a day (TID) | ORAL | 0 refills | Status: AC | PRN

## 2023-07-07 MED ORDER — TRAMADOL HCL 50 MG PO TABS: 50.0000 mg | ORAL_TABLET | Freq: Two times a day (BID) | ORAL | 0 refills | Status: DC | PRN

## 2023-07-07 NOTE — Progress Notes (Signed)
 Subjective:    Patient ID: [name] [name], female    DOB: 1934/03/29, 89 y.o.   MRN: [id number]  HPI Here for annual follow up of chronic health problems   Wt Readings from Last 3 Encounters:  07/07/23 180 lb 8 oz (81.9 kg)  05/07/23 181 lb (82.1 kg)  02/28/23 189 lb 6 oz (85.9 kg)   30.98 kg/m  Vitals:   07/07/23 1057  BP: 128/66  Pulse: 72  Temp: (!) 97.5 F (36.4 C)  SpO2: 96%    Immunization History  Administered Date(s) Administered   Influenza Split 10/17/2011   Influenza Whole 10/24/2003, 10/19/2006, 10/15/2007, 11/15/2008, 10/17/2009   Influenza, High Dose Seasonal PF 10/25/2016, 09/28/2017, 12/24/2021, 12/30/2022   Influenza, Seasonal, Injecte, Preservative Fre 09/26/2018   Influenza,inj,Quad PF,6+ Mos 09/19/2012, 11/26/2013   Influenza-Unspecified 11/10/2014, 09/18/2015   PFIZER Comirnaty(Gray Top)Covid-19 Tri-Sucrose Vaccine 06/19/2020   PFIZER(Purple Top)SARS-COV-2 Vaccination 12/17/2019, 01/07/2020   Pneumococcal Conjugate-13 05/07/2013   Pneumococcal Polysaccharide-23 01/03/2002, 06/13/2007   Td 10/09/1996, 06/13/2007   Zoster Recombinant(Shingrix) 09/26/2018, 12/19/2018   Zoster, Live 03/21/2013    Health Maintenance Due  Topic Date Due   Medicare Annual Wellness (AWV)  05/17/2023   Due for amw visit   Tetanus shot  - may get at pharmacist    Mammogram 09/2019- declines any more  Self breast exam-no lumps  Declines exam   Gyn health Vaginal dryness  Estrace  cream - needs to get back to it    Colon cancer screening -out aged   Bone health  Dexa  05/2022  low osteopenia range  In past took evista  Intol of alendronate  Falls- multiple  Fractures- hip in April with surgery / left  Supplements  Last vitamin D  Lab Results  Component Value Date   VD25OH 63.45 07/07/2023    Exercise  Home PT      Mood    07/07/2023   11:06 AM 08/15/2022   12:40 PM 06/24/2022    2:28 PM 05/17/2022    1:45 PM 05/17/2022    1:21 PM  Depression  screen PHQ 2/9  Decreased Interest 3 0 2 3 3   Down, Depressed, Hopeless 3 0 3 1 3   PHQ - 2 Score 6 0 5 4 6   Altered sleeping 0 3 1 0 0  Tired, decreased energy 3 3 3 2  0  Change in appetite 0  2 2 0  Feeling bad or failure about yourself  1 0 3 0 0  Trouble concentrating 3 3 0 2 0  Moving slowly or fidgety/restless 3 3 2  0 0  Suicidal thoughts 3 0 0 0 0  PHQ-9 Score 19 12 16 10 6   Difficult doing work/chores Extremely dIfficult Extremely dIfficult Somewhat difficult Somewhat difficult Not difficult at all   Has thought about suicide but would never never do      07/07/2023   11:06 AM 08/15/2022   12:40 PM 06/24/2022    2:28 PM  GAD 7 : Generalized Anxiety Score  Nervous, Anxious, on Edge 3 3 3   Control/stop worrying 3 3 2   Worry too much - different things 3 3 3   Trouble relaxing 3 3 3   Restless 3 3 2   Easily annoyed or irritable 3 3 3   Afraid - awful might happen 3 3 3   Total GAD 7 Score 21 21 19   Anxiety Difficulty Extremely difficult Extremely difficult Somewhat difficult       History of GAD and depression  Sertraline  100 mg bid  Wellbutrin   xl 150 mg bid (prefers to 300 once daily)  Pain management Sees Dr Rexanne Catalina  Last refill of tramadol  was on 6/13 -to get by until  Needs muscle relaxer also refilled    GERD Protonix  40 mg bid  Pepcid  40 mg daily in am   Takes plavix  History of vascular dz and cva in past    Hyperlipidemia Lab Results  Component Value Date   CHOL 173 07/07/2023   HDL 71.40 07/07/2023   LDLCALC 81 07/07/2023   TRIG 102.0 07/07/2023   CHOLHDL 2 07/07/2023   Atorvastatin  10 mg daily   Prediabetes Lab Results  Component Value Date   HGBA1C 5.7 07/07/2023   HGBA1C 6.1 (H) 06/29/2022   HGBA1C 6.2 05/12/2021    Had anemia s/p her hip surgery Lab Results  Component Value Date   WBC 6.3 07/07/2023   HGB 12.4 07/07/2023   HCT 38.8 07/07/2023   MCV 81.8 07/07/2023   PLT 352.0 07/07/2023     Patient Active Problem List    Diagnosis Date Noted   Chronic pain 07/07/2023   Hip fracture (HCC) 05/07/2023   Arthritis of both hands 08/15/2022   Cerumen impaction 06/24/2022   Right shoulder pain 12/28/2021   Esophageal dysphagia 12/06/2021   Current use of proton pump inhibitor 05/12/2021   Precordial chest pain 05/07/2021   Aortic atherosclerosis (HCC) 05/07/2021   Coronary artery calcification seen on CT scan 05/07/2021   Myalgia 12/09/2019   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 12/09/2019   Chronic tension-type headache, not intractable 09/06/2019   Ageusia 09/06/2019   Anosmia 09/06/2019   Trigeminal neuralgia 03/15/2019   Mobility impaired 12/11/2018   Poor balance 08/13/2018   Memory loss 06/27/2018   Physical deconditioning 03/19/2018   Chronic heart failure with preserved ejection fraction (HCC) 02/09/2018   Prediabetes 01/26/2017   Hearing loss 01/19/2016   Carpal tunnel syndrome 08/31/2015   Encounter for Medicare annual wellness exam 06/04/2014   Obesity 06/04/2014   Estrogen deficiency 06/04/2014   Schatzki's ring 03/27/2014   OA (osteoarthritis) of knee 08/16/2011   Hemorrhoids 08/10/2011   Other dyspnea and respiratory abnormality 06/21/2010   Mixed incontinence 05/27/2010   OVERACTIVE BLADDER 03/12/2010   Lumbar degenerative disc disease 04/13/2009   Chronic GERD 10/15/2008   History of cardiovascular disorder 10/15/2008   Arthropathy, multiple sites 06/26/2008   External hemorrhoids 02/01/2008   Diaphragmatic hernia 02/01/2008   Diverticulosis of colon 02/01/2008   GASTRITIS, HX OF 02/01/2008   DYSPNEA 09/19/2007   Vitamin D  deficiency 07/03/2007   Osteoporosis of lumbar spine 06/13/2007   Fatigue 06/13/2007   History of Helicobacter pylori infection 06/06/2006   Hyperlipidemia 06/06/2006   Generalized anxiety disorder 06/06/2006   Adjustment disorder with mixed anxiety and depressed mood 06/06/2006   Macular degeneration (senile) of retina 06/06/2006   H/O: CVA  (cerebrovascular accident) 06/06/2006   Osteoarthritis 06/06/2006   Past Medical History:  Diagnosis Date   Alternating constipation and diarrhea    Anxiety state, unspecified    CHF (congestive heart failure) (HCC)    Complication of anesthesia    slow to wake up with surgery several years ago    COVID-19 virus infection 12/2018   Depressive disorder, not elsewhere classified    Diaphragmatic hernia without mention of obstruction or gangrene    Difficulty sleeping    Diverticulosis of colon (without mention of hemorrhage)    Esophageal reflux    External hemorrhoids without mention of complication    Family history of  adverse reaction to anesthesia    Dad very slow to wake upi   Fatigue    Frequency of urination    Gallstones    Headache    Helicobacter pylori (H. pylori)    Hiatal hernia    History of nuclear stress test    Myoview  05/2021: EF 70, normal perfusion; low risk   History of transfusion    Lumbar spondylosis    Macular degeneration    Macular degeneration (senile) of retina, unspecified    Osteoarthrosis, unspecified whether generalized or localized, unspecified site 08/11/2011   hx. rhematoid arthritis, osteoarthritis, DDD, bursitis(hip)   Osteoporosis    Palpitations    PONV (postoperative nausea and vomiting)    Schatzki's ring    Shortness of breath 08/11/2011   with exertion only at present   Unspecified cerebral artery occlusion with cerebral infarction 08/11/2011   '97/ '06( TIA)-affected lt. side, slight weakness L side   Unspecified vitamin D  deficiency    Past Surgical History:  Procedure Laterality Date   ABDOMINAL HYSTERECTOMY  08-11-11   APPENDECTOMY     BACK SURGERY  08-11-11   '11-hx. lumbar fusion with retained hardware   CARPAL TUNNEL RELEASE Right 05/02/2022   Procedure: RIGHT CARPAL TUNNEL RELEASE;  Surgeon: Brunilda Capra, MD;  Location: [facility];  Service: Orthopedics;  Laterality: Right;  Bier block   CATARACT  EXTRACTION  08-11-11   Bilateral   ESOPHAGEAL DILATION     ESOPHAGOGASTRODUODENOSCOPY (EGD) WITH PROPOFOL  N/A 12/06/2021   Procedure: ESOPHAGOGASTRODUODENOSCOPY (EGD) WITH PROPOFOL ;  Surgeon: Elois Hair, MD;  Location: [facility];  Service: Gastroenterology;  Laterality: N/A;   INTRAMEDULLARY (IM) NAIL INTERTROCHANTERIC Left 05/09/2023   Procedure: FIXATION, FRACTURE, INTERTROCHANTERIC, WITH INTRAMEDULLARY ROD;  Surgeon: Claiborne Crew, MD;  Location: [facility];  Service: Orthopedics;  Laterality: Left;   KNEE SURGERY  08-11-11   rt. knee scope   PATELLAR TENDON REPAIR Left 02/22/2014   Procedure: PATELLA TENDON REPAIR;  Surgeon: Aurther Blue, MD;  Location: [facility];  Service: Orthopedics;  Laterality: Left;   PATELLAR TENDON REPAIR Left 06/25/2014   Procedure: LEFT PATELLA TENDON REPAIR;  Surgeon: Liliane Rei, MD;  Location: [facility];  Service: Orthopedics;  Laterality: Left;   SAVORY DILATION N/A 12/06/2021   Procedure: SAVORY DILATION;  Surgeon: Elois Hair, MD;  Location: [facility];  Service: Gastroenterology;  Laterality: N/A;   TOTAL KNEE ARTHROPLASTY  08/16/2011   Procedure: TOTAL KNEE ARTHROPLASTY;  Surgeon: Aurther Blue, MD;  Location: [facility];  Service: Orthopedics;  Laterality: Right;   TOTAL KNEE ARTHROPLASTY Left 01/27/2014   Procedure: LEFT TOTAL KNEE ARTHROPLASTY;  Surgeon: Aurther Blue, MD;  Location: [facility];  Service: Orthopedics;  Laterality: Left;   TRIGGER FINGER RELEASE Right 05/02/2022   Procedure: RELEASE TRIGGER FINGER/A-1 PULLEY RIGHT LONG FINGER;  Surgeon: Brunilda Capra, MD;  Location: [facility];  Service: Orthopedics;  Laterality: Right;  Bier block   TUBAL LIGATION     Social History   Tobacco Use   Smoking status: Never   Smokeless tobacco: Never  Vaping Use   Vaping status: Never Used  Substance Use Topics   Alcohol use: No    Alcohol/week: 0.0 standard drinks of alcohol   Drug use: No   Family History  Problem Relation  Age of Onset   Heart disease Father    Coronary artery disease Brother    Diabetes Brother    Heart disease Brother    Pancreatic  cancer Daughter    Diabetes Sister    Colon cancer Neg Hx    Colon polyps Neg Hx    Esophageal cancer Neg Hx    Gallbladder disease Neg Hx    Allergies  Allergen Reactions   Alendronate Sodium Other (See Comments)    JAW PAIN   Celecoxib Other (See Comments)    GI UPSET   Chocolate Diarrhea   Omeprazole Nausea Only   Rabeprazole Nausea And Vomiting   Latex Itching, Dermatitis and Rash   Sulfa  Antibiotics Rash and Dermatitis   Current Outpatient Medications on File Prior to Visit  Medication Sig Dispense Refill   acetaminophen  (TYLENOL ) 500 MG tablet Take 2 tablets (1,000 mg total) by mouth every 8 (eight) hours.     alum & mag hydroxide-simeth (MAALOX/MYLANTA) 200-200-20 MG/5ML suspension Take 30 mLs by mouth every 4 (four) hours as needed for indigestion.     APPLE CIDER VINEGAR PO Take 2 tablets by mouth See admin instructions. Chew 2 gummies by mouth at bedtime     atorvastatin  (LIPITOR) 10 MG tablet TAKE 1 TABLET DAILY (Patient taking differently: Take 10 mg by mouth every evening.) 90 tablet 2   buPROPion  (WELLBUTRIN  XL) 150 MG 24 hr tablet TAKE 2 TABLETS DAILY (Patient taking differently: Take 150 mg by mouth in the morning and at bedtime.) 180 tablet 1   calcium  carbonate (TUMS - DOSED IN MG ELEMENTAL CALCIUM ) 500 MG chewable tablet Chew 1 tablet (200 mg of elemental calcium  total) by mouth 4 (four) times daily as needed for indigestion or heartburn. 10 tablet 0   clopidogrel  (PLAVIX ) 75 MG tablet Take 1 tablet (75 mg total) by mouth daily. (Patient taking differently: Take 75 mg by mouth every evening.) 90 tablet 3   diclofenac sodium (VOLTAREN) 1 % GEL Apply 2-4 g topically at bedtime as needed (for pain- affected areas).     estradiol  (ESTRACE ) 0.1 MG/GM vaginal cream APPLY A PEA SIZED AMOUNT OF CREAM VAGINALLY TWICE A WEEK 42.5 g 2    famotidine  (PEPCID ) 40 MG tablet TAKE 1 TABLET DAILY (Patient taking differently: Take 40 mg by mouth daily before breakfast.) 90 tablet 3   furosemide  (LASIX ) 20 MG tablet TAKE 1 TABLET DAILY (Patient taking differently: Take 20 mg by mouth in the morning.) 90 tablet 3   gabapentin  (NEURONTIN ) 100 MG capsule Take 2 capsules (200 mg total) by mouth at bedtime. (Patient taking differently: Take 100 mg by mouth at bedtime. Take 3 tabs)     IMODIUM A-D 2 MG tablet Take 2 mg by mouth 4 (four) times daily as needed for diarrhea or loose stools.     Liniments (ABSORBINE ARTHRITIS STRENGTH EX) Apply 1 application  topically daily as needed (Back pain).     melatonin 3 MG TABS tablet Take 1 tablet (3 mg total) by mouth at bedtime as needed.     metoprolol  succinate (TOPROL -XL) 25 MG 24 hr tablet TAKE ONE-HALF (1/2) TABLET AT BEDTIME 45 tablet 3   nitroGLYCERIN  (NITROSTAT ) 0.4 MG SL tablet Place 1 tablet (0.4 mg total) under the tongue every 5 (five) minutes as needed for chest pain. 25 tablet 3   pantoprazole  (PROTONIX ) 40 MG tablet Take 1 tablet (40 mg total) by mouth 2 (two) times daily. 180 tablet 3   Polyethyl Glycol-Propyl Glycol (SYSTANE OP) Place 1-2 drops into both eyes 3 (three) times daily as needed (for dryness).     polyethylene glycol (MIRALAX  / GLYCOLAX ) 17 g packet Take 17 g by  mouth daily as needed.     senna (SENOKOT) 8.6 MG TABS tablet Take 2 tablets (17.2 mg total) by mouth at bedtime.     sertraline  (ZOLOFT ) 100 MG tablet TAKE 2 TABLETS DAILY (Patient taking differently: Take 100 mg by mouth in the morning and at bedtime.) 180 tablet 0   No current facility-administered medications on file prior to visit.    Review of Systems  Constitutional:  Positive for fatigue. Negative for activity change, appetite change, fever and unexpected weight change.  HENT:  Negative for congestion, ear pain, rhinorrhea, sinus pressure and sore throat.   Eyes:  Negative for pain, redness and visual  disturbance.  Respiratory:  Negative for cough, shortness of breath and wheezing.        Poor exercise tolerance   Cardiovascular:  Negative for chest pain and palpitations.  Gastrointestinal:  Negative for abdominal pain, blood in stool, constipation and diarrhea.  Endocrine: Negative for polydipsia and polyuria.  Genitourinary:  Negative for dysuria, frequency and urgency.  Musculoskeletal:  Positive for arthralgias, back pain and gait problem. Negative for myalgias.  Skin:  Negative for pallor and rash.  Allergic/Immunologic: Negative for environmental allergies.  Neurological:  Negative for dizziness, syncope and headaches.       Generalized weakness  Hematological:  Negative for adenopathy. Does not bruise/bleed easily.  Psychiatric/Behavioral:  Positive for dysphoric mood and sleep disturbance. Negative for decreased concentration. The patient is nervous/anxious.        Objective:   Physical Exam Constitutional:      General: She is not in acute distress.    Appearance: Normal appearance. She is well-developed. She is obese. She is not ill-appearing or diaphoretic.     Comments: Frail appearing elderly female in wheelchair   HENT:     Head: Normocephalic and atraumatic.     Right Ear: Tympanic membrane, ear canal and external ear normal.     Left Ear: Tympanic membrane, ear canal and external ear normal.     Nose: Nose normal. No congestion.     Mouth/Throat:     Mouth: Mucous membranes are moist.     Pharynx: Oropharynx is clear. No posterior oropharyngeal erythema.   Eyes:     General: No scleral icterus.    Extraocular Movements: Extraocular movements intact.     Conjunctiva/sclera: Conjunctivae normal.     Pupils: Pupils are equal, round, and reactive to light.   Neck:     Thyroid : No thyromegaly.     Vascular: No carotid bruit or JVD.   Cardiovascular:     Rate and Rhythm: Normal rate and regular rhythm.     Pulses: Normal pulses.     Heart sounds: Normal heart  sounds.     No gallop.  Pulmonary:     Effort: Pulmonary effort is normal. No respiratory distress.     Breath sounds: Normal breath sounds. No wheezing.     Comments: Good air exch Chest:     Chest wall: No tenderness.  Abdominal:     General: Bowel sounds are normal. There is no distension or abdominal bruit.     Palpations: Abdomen is soft. There is no mass.     Tenderness: There is no abdominal tenderness.     Hernia: No hernia is present.  Genitourinary:    Comments: Breast exam: No mass, nodules, thickening, tenderness, bulging, retraction, inflamation, nipple discharge or skin changes noted.  No axillary or clavicular LA.      Musculoskeletal:  General: No tenderness. Normal range of motion.     Cervical back: Normal range of motion and neck supple. No rigidity. No muscular tenderness.     Right lower leg: No edema.     Left lower leg: No edema.     Comments: Mild kyphosis  Decreased rom of knees/ spine and hips  In wheelchair   Lymphadenopathy:     Cervical: No cervical adenopathy.   Skin:    General: Skin is warm and dry.     Coloration: Skin is not pale.     Findings: No erythema or rash.   Neurological:     Mental Status: She is alert. Mental status is at baseline.     Cranial Nerves: No cranial nerve deficit.     Motor: No abnormal muscle tone.     Coordination: Coordination normal.     Gait: Gait normal.     Deep Tendon Reflexes: Reflexes are normal and symmetric.   Psychiatric:        Attention and Perception: Attention normal.        Mood and Affect: Mood is anxious and depressed.        Speech: Speech is tangential.        Thought Content: Thought content does not include suicidal plan.        Cognition and Memory: Cognition and memory normal.     Comments: Candidly discusses symptoms and stressors    Talks at length about chronic pain            Assessment & Plan:   Problem List Items Addressed This Visit       Cardiovascular and  Mediastinum   Chronic heart failure with preserved ejection fraction (HCC)   No clinical changes  Continues cardiology care Metoprolol  xl 25 mg daily         Digestive   Chronic GERD   Requires high dose ppi - protonix  40 mg bid  Pepcid  40 mg daily also  Under GI care   Watching vitamin levels       Relevant Orders   CBC with Differential/Platelet (Completed)     Musculoskeletal and Integument   Osteoporosis of lumbar spine   Last dexa 05/2022 in low osteopenia range  Recent hip fracture noted  In past took evista  -no longer a candidate  Intolerant of alendronate  Given info on prolia - pt is interested in this if we can get if covered   Will start process   Discussed fall prevention, supplements and exercise for bone density          Other   Vitamin D  deficiency   D level today  On ppi Stressed importance of this for bone and overall health       Relevant Orders   VITAMIN D  25 Hydroxy (Vit-D Deficiency, Fractures) (Completed)   Prediabetes   A1c ordered  disc imp of low glycemic diet and wt loss to prevent DM2       Relevant Orders   Comprehensive metabolic panel with GFR (Completed)   Hemoglobin A1c (Completed)   Obesity   Discussed how this problem influences overall health and the risks it imposes  Reviewed plan for weight loss with lower calorie diet (via better food choices (lower glycemic and portion control) along with exercise building up to or more than 30 minutes 5 days per week including some aerobic activity and strength training   Exercise is limited due to lack of mobility  Mobility impaired   Wheelchair  Worse since her hip fracture  In PT         Hyperlipidemia - Primary   Disc goals for lipids and reasons to control them Rev last labs with pt Rev low sat fat diet in detail Labs today  Continues atorvastatin  10 mg daily        Relevant Orders   TSH (Completed)   Lipid Panel (Completed)   Comprehensive metabolic panel  with GFR (Completed)   Generalized anxiety disorder   Both this and depression seem worse since recent hip fracture adding to her chronic pain  Per our past conversation I feel help from psychiatry is warranted  Sertraline  continues 100 mg bid  Addition of wellbutrin  150 xl bid helped initially but not now since last fall  GAD 7 score is 21 PHQ is up to 19 with mention of suicidal thoughts  Upon further questioning pt declines plan for suicide now or ever (family present, in agreement no potential for self harm)   Has done counseling in the past  Needs comprehensive pain control   Referral done for psychiatry        Relevant Orders   TSH (Completed)   Current use of proton pump inhibitor   B12 and D levels today  Protonix  40 mg bid  Requires high dose  May contribute to her osteoporosis       Relevant Orders   CBC with Differential/Platelet (Completed)   VITAMIN D  25 Hydroxy (Vit-D Deficiency, Fractures) (Completed)   Vitamin B12 (Completed)   Chronic pain   Spine Knees  Now hip s/p fracture   Sees Dr Rexanne Catalina for pain control  Refilled short prescription of tramadol  and robaxin  to get by until she can get an appointment there        Relevant Medications   traMADol  (ULTRAM ) 50 MG tablet   methocarbamol  (ROBAXIN ) 500 MG tablet   Adjustment disorder with mixed anxiety and depressed mood   Worsened by recent hip fracture  Screening scores are up  See a/p for GAD  Wellbutrin  xl 150 mg bid no longer helping much  Sertraline  100 mg bid   Plan referral to psychiatry

## 2023-07-07 NOTE — Assessment & Plan Note (Signed)
Disc goals for lipids and reasons to control them Rev last labs with pt Rev low sat fat diet in detail Labs today  Continues atorvastatin 10 mg daily

## 2023-07-07 NOTE — Patient Instructions (Addendum)
 Next you are at the pharmacy get the tetanus shot  Or the health dept   Give your pain management doctor a call  I can refill tramadol  and muscle relaxer until you get in with Dr Rexanne Catalina   I want to look into prolia for osteoporosis  Read about it / see what you think    Labs today   I put the referral in for psychiatry to help me with treatment of depression  Please let us  know if you don't hear in 1-2 weeks to set that up  Please schedule your phone medicare visit at check out

## 2023-07-07 NOTE — Assessment & Plan Note (Signed)
 Spine Knees  Now hip s/p fracture   Sees Dr Rexanne Catalina for pain control  Refilled short prescription of tramadol  and robaxin  to get by until she can get an appointment there

## 2023-07-07 NOTE — Assessment & Plan Note (Signed)
 Requires high dose ppi - protonix  40 mg bid  Pepcid  40 mg daily also  Under GI care   Watching vitamin levels

## 2023-07-07 NOTE — Assessment & Plan Note (Signed)
 A1c ordered  disc imp of low glycemic diet and wt loss to prevent DM2

## 2023-07-07 NOTE — Assessment & Plan Note (Addendum)
 Both this and depression seem worse since recent hip fracture adding to her chronic pain  Per our past conversation I feel help from psychiatry is warranted  Sertraline  continues 100 mg bid  Addition of wellbutrin  150 xl bid helped initially but not now since last fall  GAD 7 score is 21 PHQ is up to 19 with mention of suicidal thoughts  Upon further questioning pt declines plan for suicide now or ever (family present, in agreement no potential for self harm)   Has done counseling in the past  Needs comprehensive pain control   Referral done for psychiatry

## 2023-07-07 NOTE — Assessment & Plan Note (Signed)
 D level today  On ppi Stressed importance of this for bone and overall health

## 2023-07-07 NOTE — Assessment & Plan Note (Signed)
 Discussed how this problem influences overall health and the risks it imposes  Reviewed plan for weight loss with lower calorie diet (via better food choices (lower glycemic and portion control) along with exercise building up to or more than 30 minutes 5 days per week including some aerobic activity and strength training   Exercise is limited due to lack of mobility

## 2023-07-07 NOTE — Assessment & Plan Note (Signed)
 No clinical changes  Continues cardiology care Metoprolol  xl 25 mg daily

## 2023-07-07 NOTE — Assessment & Plan Note (Signed)
 Last dexa 05/2022 in low osteopenia range  Recent hip fracture noted  In past took evista  -no longer a candidate  Intolerant of alendronate  Given info on prolia - pt is interested in this if we can get if covered   Will start process   Discussed fall prevention, supplements and exercise for bone density

## 2023-07-07 NOTE — Assessment & Plan Note (Signed)
 Worsened by recent hip fracture  Screening scores are up  See a/p for GAD  Wellbutrin  xl 150 mg bid no longer helping much  Sertraline  100 mg bid   Plan referral to psychiatry

## 2023-07-07 NOTE — Assessment & Plan Note (Signed)
 B12 and D levels today  Protonix  40 mg bid  Requires high dose  May contribute to her osteoporosis

## 2023-07-07 NOTE — Assessment & Plan Note (Signed)
 Wheelchair  Worse since her hip fracture  In PT

## 2023-07-10 ENCOUNTER — Telehealth: Payer: Self-pay

## 2023-07-10 DIAGNOSIS — L509 Urticaria, unspecified: Secondary | ICD-10-CM | POA: Diagnosis not present

## 2023-07-10 DIAGNOSIS — L821 Other seborrheic keratosis: Secondary | ICD-10-CM | POA: Diagnosis not present

## 2023-07-10 DIAGNOSIS — L570 Actinic keratosis: Secondary | ICD-10-CM | POA: Diagnosis not present

## 2023-07-10 DIAGNOSIS — D692 Other nonthrombocytopenic purpura: Secondary | ICD-10-CM | POA: Diagnosis not present

## 2023-07-10 MED ORDER — DENOSUMAB 60 MG/ML ~~LOC~~ SOSY: 60.0000 mg | PREFILLED_SYRINGE | Freq: Once | SUBCUTANEOUS | Status: AC

## 2023-07-10 NOTE — Telephone Encounter (Signed)
 [name] [name]

## 2023-07-10 NOTE — Addendum Note (Signed)
 Addended by: SEBASTIAN DANNA GRADE on: 07/10/2023 10:17 AM   Modules accepted: Orders

## 2023-07-10 NOTE — Telephone Encounter (Signed)
 Prolia VOB initiated via [email or web address]  Next Prolia inj DUE: NEW START

## 2023-07-10 NOTE — Telephone Encounter (Signed)
 Pt ready for scheduling for PROLIA on or after : 07/10/23  Option# 1: Buy/Bill (Office supplied medication)  Out-of-pocket cost due at time of clinic visit: $0  Number of injection/visits approved: ---  Primary: MEDICARE Prolia co-insurance: 0% Admin fee co-insurance: 0%  Secondary: TRICARE FOR LIFE Prolia co-insurance:  Admin fee co-insurance:   Medical Benefit Details: Date Benefits were checked: 07/10/23 Deductible: $257 Met of $257 Required/ Coinsurance: 0%/ Admin Fee: 0%  Prior Auth: N/A PA# Expiration Date:   # of doses approved: ----------------------------------------------------------------------- Option# 2- Med Obtained from pharmacy:  Pharmacy benefit: Copay $--- (Paid to pharmacy) Admin Fee: --- (Pay at clinic)  Prior Auth: --- PA# Expiration Date:   # of doses approved:   If patient wants fill through the pharmacy benefit please send prescription to: ---, and include estimated need by date in rx notes. Pharmacy will ship medication directly to the office.  Patient NOT eligible for Prolia Copay Card. Copay Card can make patient's cost as little as $25. Link to apply: [email or web address]://www.amgensupportplus.[email or web address]/copay  ** This summary of benefits is an estimation of the patient's out-of-pocket cost. Exact cost may very based on individual plan coverage.

## 2023-07-12 DIAGNOSIS — I2510 Atherosclerotic heart disease of native coronary artery without angina pectoris: Secondary | ICD-10-CM | POA: Diagnosis not present

## 2023-07-12 DIAGNOSIS — M80052D Age-related osteoporosis with current pathological fracture, left femur, subsequent encounter for fracture with routine healing: Secondary | ICD-10-CM | POA: Diagnosis not present

## 2023-07-12 DIAGNOSIS — G500 Trigeminal neuralgia: Secondary | ICD-10-CM | POA: Diagnosis not present

## 2023-07-12 DIAGNOSIS — I5032 Chronic diastolic (congestive) heart failure: Secondary | ICD-10-CM | POA: Diagnosis not present

## 2023-07-12 DIAGNOSIS — E785 Hyperlipidemia, unspecified: Secondary | ICD-10-CM | POA: Diagnosis not present

## 2023-07-12 DIAGNOSIS — K219 Gastro-esophageal reflux disease without esophagitis: Secondary | ICD-10-CM | POA: Diagnosis not present

## 2023-07-12 DIAGNOSIS — F32A Depression, unspecified: Secondary | ICD-10-CM | POA: Diagnosis not present

## 2023-07-12 DIAGNOSIS — G629 Polyneuropathy, unspecified: Secondary | ICD-10-CM | POA: Diagnosis not present

## 2023-07-12 DIAGNOSIS — F411 Generalized anxiety disorder: Secondary | ICD-10-CM | POA: Diagnosis not present

## 2023-07-12 DIAGNOSIS — D62 Acute posthemorrhagic anemia: Secondary | ICD-10-CM | POA: Diagnosis not present

## 2023-07-12 DIAGNOSIS — G8929 Other chronic pain: Secondary | ICD-10-CM | POA: Diagnosis not present

## 2023-07-12 DIAGNOSIS — I110 Hypertensive heart disease with heart failure: Secondary | ICD-10-CM | POA: Diagnosis not present

## 2023-07-13 DIAGNOSIS — I5032 Chronic diastolic (congestive) heart failure: Secondary | ICD-10-CM | POA: Diagnosis not present

## 2023-07-13 DIAGNOSIS — I110 Hypertensive heart disease with heart failure: Secondary | ICD-10-CM | POA: Diagnosis not present

## 2023-07-13 DIAGNOSIS — M80052D Age-related osteoporosis with current pathological fracture, left femur, subsequent encounter for fracture with routine healing: Secondary | ICD-10-CM | POA: Diagnosis not present

## 2023-07-13 DIAGNOSIS — K219 Gastro-esophageal reflux disease without esophagitis: Secondary | ICD-10-CM | POA: Diagnosis not present

## 2023-07-13 DIAGNOSIS — F32A Depression, unspecified: Secondary | ICD-10-CM | POA: Diagnosis not present

## 2023-07-13 DIAGNOSIS — G500 Trigeminal neuralgia: Secondary | ICD-10-CM | POA: Diagnosis not present

## 2023-07-17 DIAGNOSIS — I110 Hypertensive heart disease with heart failure: Secondary | ICD-10-CM | POA: Diagnosis not present

## 2023-07-17 DIAGNOSIS — F32A Depression, unspecified: Secondary | ICD-10-CM | POA: Diagnosis not present

## 2023-07-17 DIAGNOSIS — I5032 Chronic diastolic (congestive) heart failure: Secondary | ICD-10-CM | POA: Diagnosis not present

## 2023-07-17 DIAGNOSIS — K219 Gastro-esophageal reflux disease without esophagitis: Secondary | ICD-10-CM | POA: Diagnosis not present

## 2023-07-17 DIAGNOSIS — G500 Trigeminal neuralgia: Secondary | ICD-10-CM | POA: Diagnosis not present

## 2023-07-17 DIAGNOSIS — M80052D Age-related osteoporosis with current pathological fracture, left femur, subsequent encounter for fracture with routine healing: Secondary | ICD-10-CM | POA: Diagnosis not present

## 2023-07-18 DIAGNOSIS — I5032 Chronic diastolic (congestive) heart failure: Secondary | ICD-10-CM | POA: Diagnosis not present

## 2023-07-18 DIAGNOSIS — I110 Hypertensive heart disease with heart failure: Secondary | ICD-10-CM | POA: Diagnosis not present

## 2023-07-18 DIAGNOSIS — F32A Depression, unspecified: Secondary | ICD-10-CM | POA: Diagnosis not present

## 2023-07-18 DIAGNOSIS — K219 Gastro-esophageal reflux disease without esophagitis: Secondary | ICD-10-CM | POA: Diagnosis not present

## 2023-07-18 DIAGNOSIS — G500 Trigeminal neuralgia: Secondary | ICD-10-CM | POA: Diagnosis not present

## 2023-07-18 DIAGNOSIS — M80052D Age-related osteoporosis with current pathological fracture, left femur, subsequent encounter for fracture with routine healing: Secondary | ICD-10-CM | POA: Diagnosis not present

## 2023-07-20 DIAGNOSIS — F32A Depression, unspecified: Secondary | ICD-10-CM | POA: Diagnosis not present

## 2023-07-20 DIAGNOSIS — M80052D Age-related osteoporosis with current pathological fracture, left femur, subsequent encounter for fracture with routine healing: Secondary | ICD-10-CM | POA: Diagnosis not present

## 2023-07-20 DIAGNOSIS — I5032 Chronic diastolic (congestive) heart failure: Secondary | ICD-10-CM | POA: Diagnosis not present

## 2023-07-20 DIAGNOSIS — K219 Gastro-esophageal reflux disease without esophagitis: Secondary | ICD-10-CM | POA: Diagnosis not present

## 2023-07-20 DIAGNOSIS — I110 Hypertensive heart disease with heart failure: Secondary | ICD-10-CM | POA: Diagnosis not present

## 2023-07-20 DIAGNOSIS — G500 Trigeminal neuralgia: Secondary | ICD-10-CM | POA: Diagnosis not present

## 2023-07-24 ENCOUNTER — Ambulatory Visit: Payer: Self-pay | Admitting: *Deleted

## 2023-07-24 DIAGNOSIS — M80052D Age-related osteoporosis with current pathological fracture, left femur, subsequent encounter for fracture with routine healing: Secondary | ICD-10-CM | POA: Diagnosis not present

## 2023-07-24 DIAGNOSIS — I110 Hypertensive heart disease with heart failure: Secondary | ICD-10-CM | POA: Diagnosis not present

## 2023-07-24 DIAGNOSIS — I5032 Chronic diastolic (congestive) heart failure: Secondary | ICD-10-CM | POA: Diagnosis not present

## 2023-07-24 DIAGNOSIS — K219 Gastro-esophageal reflux disease without esophagitis: Secondary | ICD-10-CM | POA: Diagnosis not present

## 2023-07-24 DIAGNOSIS — G500 Trigeminal neuralgia: Secondary | ICD-10-CM | POA: Diagnosis not present

## 2023-07-24 DIAGNOSIS — F32A Depression, unspecified: Secondary | ICD-10-CM | POA: Diagnosis not present

## 2023-07-24 NOTE — Telephone Encounter (Signed)
 FYI Only or Action Required?: Action required by provider: medication refill request and medication PCP has not prescribed.  Patient was last seen in primary care on 07/07/2023 by Randeen Laine LABOR, MD. Called Nurse Triage reporting Medication Problem. Symptoms began several days ago. Interventions attempted: Prescription medications: cream prescribed by dermatology-name not given . Symptoms are: gradually worsening.  Triage Disposition: See PCP When Office is Open (Within 3 Days)  Patient/caregiver understands and will follow disposition?: No, wishes to speak with PCP. Contact patient [phone]  Reason for Disposition  Prescription request for new medicine (not a refill)  Answer Assessment - Initial Assessment Questions 1. NAME of MEDICINE: What medicine(s) are you calling about?     Hydroxyzine 25mg  every 8 hours as needed 2. QUESTION: What is your question? (e.g., double dose of medicine, side effect)     Patient was taking at [facility]- PCP has never prescribed 3. PRESCRIBER: Who prescribed the medicine? Reason: if prescribed by specialist, call should be referred to that group.     PCP 4. SYMPTOMS: Do you have any symptoms? If Yes, ask: What symptoms are you having?  How bad are the symptoms (e.g., mild, moderate, severe)     Itching- patient has started itching on back- she is scratching.  Patient advised she may need appointment since this ia a new medication request- daughter and patient would prefer a virtual visit if possible- it is difficult for patient to come to office. I advised I would send request to see if that is an option.  Protocols used: Medication Question Call-A-AH   Copied from CRM [id number]. Topic: Clinical - Medical Advice >> Jul 24, 2023 12:15 PM Cleave MATSU wrote: Reason for CRM: she's been having itching and anxiety. She was previously taking a medication that was helping she wants to know if Dr.tower would be okay with her continuing the  medication and can she prescribe it to her . Please follow up

## 2023-07-24 NOTE — Telephone Encounter (Signed)
 I am out of town/out of office this week  A virtual visit may not be optimal due to fact this is a dermatologic condition

## 2023-07-25 NOTE — Telephone Encounter (Signed)
 See Dr. Graham comments. Pt needs an in person appt with an available provider or if she wants to wait to see Dr. Randeen please schedule next week when she is back. (In person)

## 2023-07-26 DIAGNOSIS — M545 Low back pain, unspecified: Secondary | ICD-10-CM | POA: Diagnosis not present

## 2023-07-26 DIAGNOSIS — F411 Generalized anxiety disorder: Secondary | ICD-10-CM | POA: Diagnosis not present

## 2023-07-26 DIAGNOSIS — Z79899 Other long term (current) drug therapy: Secondary | ICD-10-CM | POA: Diagnosis not present

## 2023-07-26 DIAGNOSIS — M4856XD Collapsed vertebra, not elsewhere classified, lumbar region, subsequent encounter for fracture with routine healing: Secondary | ICD-10-CM | POA: Diagnosis not present

## 2023-07-28 DIAGNOSIS — I5032 Chronic diastolic (congestive) heart failure: Secondary | ICD-10-CM | POA: Diagnosis not present

## 2023-07-28 DIAGNOSIS — K219 Gastro-esophageal reflux disease without esophagitis: Secondary | ICD-10-CM | POA: Diagnosis not present

## 2023-07-28 DIAGNOSIS — F32A Depression, unspecified: Secondary | ICD-10-CM | POA: Diagnosis not present

## 2023-07-28 DIAGNOSIS — I110 Hypertensive heart disease with heart failure: Secondary | ICD-10-CM | POA: Diagnosis not present

## 2023-07-28 DIAGNOSIS — M80052D Age-related osteoporosis with current pathological fracture, left femur, subsequent encounter for fracture with routine healing: Secondary | ICD-10-CM | POA: Diagnosis not present

## 2023-07-28 DIAGNOSIS — G500 Trigeminal neuralgia: Secondary | ICD-10-CM | POA: Diagnosis not present

## 2023-07-31 ENCOUNTER — Other Ambulatory Visit: Payer: Self-pay | Admitting: *Deleted

## 2023-07-31 DIAGNOSIS — G8929 Other chronic pain: Secondary | ICD-10-CM | POA: Diagnosis not present

## 2023-07-31 DIAGNOSIS — Z7902 Long term (current) use of antithrombotics/antiplatelets: Secondary | ICD-10-CM | POA: Diagnosis not present

## 2023-07-31 DIAGNOSIS — I2510 Atherosclerotic heart disease of native coronary artery without angina pectoris: Secondary | ICD-10-CM | POA: Diagnosis not present

## 2023-07-31 DIAGNOSIS — Z9181 History of falling: Secondary | ICD-10-CM | POA: Diagnosis not present

## 2023-07-31 DIAGNOSIS — H6123 Impacted cerumen, bilateral: Secondary | ICD-10-CM | POA: Diagnosis not present

## 2023-07-31 DIAGNOSIS — Z9981 Dependence on supplemental oxygen: Secondary | ICD-10-CM | POA: Diagnosis not present

## 2023-07-31 DIAGNOSIS — I5032 Chronic diastolic (congestive) heart failure: Secondary | ICD-10-CM | POA: Diagnosis not present

## 2023-07-31 DIAGNOSIS — I110 Hypertensive heart disease with heart failure: Secondary | ICD-10-CM | POA: Diagnosis not present

## 2023-07-31 DIAGNOSIS — H3530 Unspecified macular degeneration: Secondary | ICD-10-CM | POA: Diagnosis not present

## 2023-07-31 DIAGNOSIS — D62 Acute posthemorrhagic anemia: Secondary | ICD-10-CM | POA: Diagnosis not present

## 2023-07-31 DIAGNOSIS — G629 Polyneuropathy, unspecified: Secondary | ICD-10-CM | POA: Diagnosis not present

## 2023-07-31 DIAGNOSIS — F411 Generalized anxiety disorder: Secondary | ICD-10-CM | POA: Diagnosis not present

## 2023-07-31 DIAGNOSIS — M80052D Age-related osteoporosis with current pathological fracture, left femur, subsequent encounter for fracture with routine healing: Secondary | ICD-10-CM | POA: Diagnosis not present

## 2023-07-31 DIAGNOSIS — E785 Hyperlipidemia, unspecified: Secondary | ICD-10-CM | POA: Diagnosis not present

## 2023-07-31 DIAGNOSIS — G500 Trigeminal neuralgia: Secondary | ICD-10-CM | POA: Diagnosis not present

## 2023-07-31 DIAGNOSIS — K449 Diaphragmatic hernia without obstruction or gangrene: Secondary | ICD-10-CM | POA: Diagnosis not present

## 2023-07-31 DIAGNOSIS — H73011 Bullous myringitis, right ear: Secondary | ICD-10-CM | POA: Diagnosis not present

## 2023-07-31 DIAGNOSIS — F32A Depression, unspecified: Secondary | ICD-10-CM | POA: Diagnosis not present

## 2023-07-31 DIAGNOSIS — K219 Gastro-esophageal reflux disease without esophagitis: Secondary | ICD-10-CM | POA: Diagnosis not present

## 2023-07-31 MED ORDER — ATORVASTATIN CALCIUM 10 MG PO TABS: ORAL_TABLET | ORAL | 2 refills | Status: AC

## 2023-08-01 ENCOUNTER — Ambulatory Visit: Admitting: Family Medicine

## 2023-08-01 ENCOUNTER — Ambulatory Visit

## 2023-08-01 ENCOUNTER — Other Ambulatory Visit: Payer: Self-pay

## 2023-08-01 MED ORDER — FUROSEMIDE 20 MG PO TABS: ORAL_TABLET | ORAL | 1 refills | Status: AC

## 2023-08-02 DIAGNOSIS — F32A Depression, unspecified: Secondary | ICD-10-CM | POA: Diagnosis not present

## 2023-08-02 DIAGNOSIS — M80052D Age-related osteoporosis with current pathological fracture, left femur, subsequent encounter for fracture with routine healing: Secondary | ICD-10-CM | POA: Diagnosis not present

## 2023-08-02 DIAGNOSIS — I110 Hypertensive heart disease with heart failure: Secondary | ICD-10-CM | POA: Diagnosis not present

## 2023-08-02 DIAGNOSIS — G500 Trigeminal neuralgia: Secondary | ICD-10-CM | POA: Diagnosis not present

## 2023-08-02 DIAGNOSIS — I5032 Chronic diastolic (congestive) heart failure: Secondary | ICD-10-CM | POA: Diagnosis not present

## 2023-08-02 DIAGNOSIS — K219 Gastro-esophageal reflux disease without esophagitis: Secondary | ICD-10-CM | POA: Diagnosis not present

## 2023-08-03 DIAGNOSIS — Z96652 Presence of left artificial knee joint: Secondary | ICD-10-CM | POA: Diagnosis not present

## 2023-08-03 DIAGNOSIS — S72142D Displaced intertrochanteric fracture of left femur, subsequent encounter for closed fracture with routine healing: Secondary | ICD-10-CM | POA: Diagnosis not present

## 2023-08-03 DIAGNOSIS — M25562 Pain in left knee: Secondary | ICD-10-CM | POA: Diagnosis not present

## 2023-08-04 ENCOUNTER — Other Ambulatory Visit: Payer: Self-pay | Admitting: *Deleted

## 2023-08-04 DIAGNOSIS — F4323 Adjustment disorder with mixed anxiety and depressed mood: Secondary | ICD-10-CM

## 2023-08-04 MED ORDER — PANTOPRAZOLE SODIUM 40 MG PO TBEC: 40.0000 mg | DELAYED_RELEASE_TABLET | Freq: Two times a day (BID) | ORAL | 1 refills | Status: DC

## 2023-08-04 MED ORDER — BUPROPION HCL ER (XL) 150 MG PO TB24: 300.0000 mg | ORAL_TABLET | Freq: Every day | ORAL | 1 refills | Status: DC

## 2023-08-04 MED ORDER — METOPROLOL SUCCINATE ER 25 MG PO TB24: ORAL_TABLET | ORAL | 1 refills | Status: DC

## 2023-08-04 MED ORDER — SERTRALINE HCL 100 MG PO TABS: 200.0000 mg | ORAL_TABLET | Freq: Every day | ORAL | 1 refills | Status: DC

## 2023-08-08 DIAGNOSIS — G500 Trigeminal neuralgia: Secondary | ICD-10-CM | POA: Diagnosis not present

## 2023-08-08 DIAGNOSIS — K219 Gastro-esophageal reflux disease without esophagitis: Secondary | ICD-10-CM | POA: Diagnosis not present

## 2023-08-08 DIAGNOSIS — F32A Depression, unspecified: Secondary | ICD-10-CM | POA: Diagnosis not present

## 2023-08-08 DIAGNOSIS — I5032 Chronic diastolic (congestive) heart failure: Secondary | ICD-10-CM | POA: Diagnosis not present

## 2023-08-08 DIAGNOSIS — I110 Hypertensive heart disease with heart failure: Secondary | ICD-10-CM | POA: Diagnosis not present

## 2023-08-08 DIAGNOSIS — M80052D Age-related osteoporosis with current pathological fracture, left femur, subsequent encounter for fracture with routine healing: Secondary | ICD-10-CM | POA: Diagnosis not present

## 2023-08-09 DIAGNOSIS — M80052D Age-related osteoporosis with current pathological fracture, left femur, subsequent encounter for fracture with routine healing: Secondary | ICD-10-CM | POA: Diagnosis not present

## 2023-08-09 DIAGNOSIS — G500 Trigeminal neuralgia: Secondary | ICD-10-CM | POA: Diagnosis not present

## 2023-08-09 DIAGNOSIS — I110 Hypertensive heart disease with heart failure: Secondary | ICD-10-CM | POA: Diagnosis not present

## 2023-08-09 DIAGNOSIS — F32A Depression, unspecified: Secondary | ICD-10-CM | POA: Diagnosis not present

## 2023-08-09 DIAGNOSIS — K219 Gastro-esophageal reflux disease without esophagitis: Secondary | ICD-10-CM | POA: Diagnosis not present

## 2023-08-09 DIAGNOSIS — I5032 Chronic diastolic (congestive) heart failure: Secondary | ICD-10-CM | POA: Diagnosis not present

## 2023-08-10 ENCOUNTER — Ambulatory Visit

## 2023-08-10 VITALS — BP 128/66 | Ht 64.0 in | Wt 160.0 lb

## 2023-08-10 DIAGNOSIS — F32A Depression, unspecified: Secondary | ICD-10-CM | POA: Diagnosis not present

## 2023-08-10 DIAGNOSIS — Z0000 Encounter for general adult medical examination without abnormal findings: Secondary | ICD-10-CM

## 2023-08-10 DIAGNOSIS — K219 Gastro-esophageal reflux disease without esophagitis: Secondary | ICD-10-CM | POA: Diagnosis not present

## 2023-08-10 DIAGNOSIS — I110 Hypertensive heart disease with heart failure: Secondary | ICD-10-CM | POA: Diagnosis not present

## 2023-08-10 DIAGNOSIS — G500 Trigeminal neuralgia: Secondary | ICD-10-CM | POA: Diagnosis not present

## 2023-08-10 DIAGNOSIS — M80052D Age-related osteoporosis with current pathological fracture, left femur, subsequent encounter for fracture with routine healing: Secondary | ICD-10-CM | POA: Diagnosis not present

## 2023-08-10 DIAGNOSIS — I5032 Chronic diastolic (congestive) heart failure: Secondary | ICD-10-CM | POA: Diagnosis not present

## 2023-08-10 NOTE — Progress Notes (Signed)
 Because this visit was a virtual/telehealth visit,  certain criteria was not obtained, such a blood pressure, CBG if applicable, and timed get up and go. Any medications not marked as taking were not mentioned during the medication reconciliation part of the visit. Any vitals not documented were not able to be obtained due to this being a telehealth visit or patient was unable to self-report a recent blood pressure reading due to a lack of equipment at home via telehealth. Vitals that have been documented are verbally provided by the patient.  This visit was performed by a medical professional under my direct supervision. I was immediately available for consultation/collaboration. I have reviewed and agree with the Annual Wellness Visit documentation.  Subjective:   [name] [name] is a 89 y.o. who presents for a Medicare Wellness preventive visit.  As a reminder, Annual Wellness Visits don't include a physical exam, and some assessments may be limited, especially if this visit is performed virtually. We may recommend an in-person follow-up visit with your provider if needed.  Visit Complete: Virtual I connected with  [name] on 08/10/23 by a audio enabled telemedicine application and verified that I am speaking with the correct person using two identifiers.  Patient Location: Home  Provider Location: Home Office  I discussed the limitations of evaluation and management by telemedicine. The patient expressed understanding and agreed to proceed.  Vital Signs: Because this visit was a virtual/telehealth visit, some criteria may be missing or patient reported. Any vitals not documented were not able to be obtained and vitals that have been documented are patient reported.  VideoDeclined- This patient declined Librarian, academic. Therefore the visit was completed with audio only.  Persons Participating in Visit: Patient.  AWV Questionnaire: No: Patient  Medicare AWV questionnaire was not completed prior to this visit.  Cardiac Risk Factors include: advanced age (>34men, >105 women)     Objective:    Today's Vitals   08/10/23 1518  BP: 128/66  Weight: 160 lb (72.6 kg)  Height: 5' 4 (1.626 m)  PainSc: 8    Body mass index is 27.46 kg/m.     08/10/2023    3:17 PM 05/07/2023    8:00 PM 05/07/2023    3:04 PM 05/17/2022    1:23 PM 05/02/2022   11:40 AM 02/28/2022    3:32 PM 12/06/2021   10:00 AM  Advanced Directives  Does Patient Have a Medical Advance Directive? Yes Yes Yes Yes Yes Yes Yes  Type of Estate agent of [place];Living will Healthcare Power of [place];Living will  Healthcare Power of [place];Living will Healthcare Power of [place];Living will Living will;Healthcare Power of [employer] Power of [place];Living will  Does patient want to make changes to medical advance directive?  No - Patient declined --      Copy of Healthcare Power of Attorney in Chart? No - copy requested No - copy requested     No - copy requested  Would patient like information on creating a medical advance directive?     No - Patient declined      Current Medications (verified) Outpatient Encounter Medications as of 08/10/2023  Medication Sig   acetaminophen  (TYLENOL ) 500 MG tablet Take 2 tablets (1,000 mg total) by mouth every 8 (eight) hours.   alum & mag hydroxide-simeth (MAALOX/MYLANTA) 200-200-20 MG/5ML suspension Take 30 mLs by mouth every 4 (four) hours as needed for indigestion.   APPLE CIDER VINEGAR PO Take 2 tablets by mouth See  admin instructions. Chew 2 gummies by mouth at bedtime   atorvastatin  (LIPITOR) 10 MG tablet TAKE 1 TABLET DAILY   buPROPion  (WELLBUTRIN  XL) 150 MG 24 hr tablet Take 2 tablets (300 mg total) by mouth daily.   calcium  carbonate (TUMS - DOSED IN MG ELEMENTAL CALCIUM ) 500 MG chewable tablet Chew 1 tablet (200 mg of elemental calcium  total) by mouth 4 (four) times daily as needed for  indigestion or heartburn.   clopidogrel  (PLAVIX ) 75 MG tablet Take 1 tablet (75 mg total) by mouth daily. (Patient taking differently: Take 75 mg by mouth every evening.)   diclofenac sodium (VOLTAREN) 1 % GEL Apply 2-4 g topically at bedtime as needed (for pain- affected areas).   estradiol  (ESTRACE ) 0.1 MG/GM vaginal cream APPLY A PEA SIZED AMOUNT OF CREAM VAGINALLY TWICE A WEEK   famotidine  (PEPCID ) 40 MG tablet TAKE 1 TABLET DAILY (Patient taking differently: Take 40 mg by mouth daily before breakfast.)   furosemide  (LASIX ) 20 MG tablet TAKE 1 TABLET DAILY   gabapentin  (NEURONTIN ) 100 MG capsule Take 2 capsules (200 mg total) by mouth at bedtime. (Patient taking differently: Take 100 mg by mouth at bedtime. Take 3 tabs)   IMODIUM A-D 2 MG tablet Take 2 mg by mouth 4 (four) times daily as needed for diarrhea or loose stools.   Liniments (ABSORBINE ARTHRITIS STRENGTH EX) Apply 1 application  topically daily as needed (Back pain).   melatonin 3 MG TABS tablet Take 1 tablet (3 mg total) by mouth at bedtime as needed.   methocarbamol  (ROBAXIN ) 500 MG tablet Take 1 tablet (500 mg total) by mouth every 8 (eight) hours as needed for muscle spasms. Caution of sedation   metoprolol  succinate (TOPROL -XL) 25 MG 24 hr tablet TAKE ONE-HALF (1/2) TABLET AT BEDTIME   nitroGLYCERIN  (NITROSTAT ) 0.4 MG SL tablet Place 1 tablet (0.4 mg total) under the tongue every 5 (five) minutes as needed for chest pain.   pantoprazole  (PROTONIX ) 40 MG tablet Take 1 tablet (40 mg total) by mouth 2 (two) times daily.   Polyethyl Glycol-Propyl Glycol (SYSTANE OP) Place 1-2 drops into both eyes 3 (three) times daily as needed (for dryness).   polyethylene glycol (MIRALAX  / GLYCOLAX ) 17 g packet Take 17 g by mouth daily as needed.   senna (SENOKOT) 8.6 MG TABS tablet Take 2 tablets (17.2 mg total) by mouth at bedtime.   sertraline  (ZOLOFT ) 100 MG tablet Take 2 tablets (200 mg total) by mouth daily.   traMADol  (ULTRAM ) 50 MG tablet  Take 1 tablet (50 mg total) by mouth every 12 (twelve) hours as needed for severe pain (pain score 7-10). Caution of sedation   Facility-Administered Encounter Medications as of 08/10/2023  Medication   denosumab  (PROLIA ) injection 60 mg    Allergies (verified) Alendronate sodium, Celecoxib, Chocolate, Omeprazole, Rabeprazole, Latex, and Sulfa  antibiotics   History: Past Medical History:  Diagnosis Date   Alternating constipation and diarrhea    Anxiety state, unspecified    CHF (congestive heart failure) (HCC)    Complication of anesthesia    slow to wake up with surgery several years ago    COVID-19 virus infection 12/2018   Depressive disorder, not elsewhere classified    Diaphragmatic hernia without mention of obstruction or gangrene    Difficulty sleeping    Diverticulosis of colon (without mention of hemorrhage)    Esophageal reflux    External hemorrhoids without mention of complication    Family history of adverse reaction to anesthesia  Dad very slow to wake upi   Fatigue    Frequency of urination    Gallstones    Headache    Helicobacter pylori (H. pylori)    Hiatal hernia    History of nuclear stress test    Myoview  05/2021: EF 70, normal perfusion; low risk   History of transfusion    Lumbar spondylosis    Macular degeneration    Macular degeneration (senile) of retina, unspecified    Osteoarthrosis, unspecified whether generalized or localized, unspecified site 08/11/2011   hx. rhematoid arthritis, osteoarthritis, DDD, bursitis(hip)   Osteoporosis    Palpitations    PONV (postoperative nausea and vomiting)    Schatzki's ring    Shortness of breath 08/11/2011   with exertion only at present   Unspecified cerebral artery occlusion with cerebral infarction 08/11/2011   '97/ '06( TIA)-affected lt. side, slight weakness L side   Unspecified vitamin D  deficiency    Past Surgical History:  Procedure Laterality Date   ABDOMINAL HYSTERECTOMY  08-11-11    APPENDECTOMY     BACK SURGERY  08-11-11   '11-hx. lumbar fusion with retained hardware   CARPAL TUNNEL RELEASE Right 05/02/2022   Procedure: RIGHT CARPAL TUNNEL RELEASE;  Surgeon: Murrell Drivers, MD;  Location: [facility];  Service: Orthopedics;  Laterality: Right;  Bier block   CATARACT EXTRACTION  08-11-11   Bilateral   ESOPHAGEAL DILATION     ESOPHAGOGASTRODUODENOSCOPY (EGD) WITH PROPOFOL  N/A 12/06/2021   Procedure: ESOPHAGOGASTRODUODENOSCOPY (EGD) WITH PROPOFOL ;  Surgeon: Stacia Glendia BRAVO, MD;  Location: [facility];  Service: Gastroenterology;  Laterality: N/A;   INTRAMEDULLARY (IM) NAIL INTERTROCHANTERIC Left 05/09/2023   Procedure: FIXATION, FRACTURE, INTERTROCHANTERIC, WITH INTRAMEDULLARY ROD;  Surgeon: Ernie Cough, MD;  Location: [facility];  Service: Orthopedics;  Laterality: Left;   KNEE SURGERY  08-11-11   rt. knee scope   PATELLAR TENDON REPAIR Left 02/22/2014   Procedure: PATELLA TENDON REPAIR;  Surgeon: Dempsey Melodi GAILS, MD;  Location: [facility];  Service: Orthopedics;  Laterality: Left;   PATELLAR TENDON REPAIR Left 06/25/2014   Procedure: LEFT PATELLA TENDON REPAIR;  Surgeon: Dempsey Melodi, MD;  Location: [facility];  Service: Orthopedics;  Laterality: Left;   SAVORY DILATION N/A 12/06/2021   Procedure: SAVORY DILATION;  Surgeon: Stacia Glendia BRAVO, MD;  Location: [facility];  Service: Gastroenterology;  Laterality: N/A;   TOTAL KNEE ARTHROPLASTY  08/16/2011   Procedure: TOTAL KNEE ARTHROPLASTY;  Surgeon: Dempsey GAILS Melodi, MD;  Location: [facility];  Service: Orthopedics;  Laterality: Right;   TOTAL KNEE ARTHROPLASTY Left 01/27/2014   Procedure: LEFT TOTAL KNEE ARTHROPLASTY;  Surgeon: Dempsey Melodi GAILS, MD;  Location: [facility];  Service: Orthopedics;  Laterality: Left;   TRIGGER FINGER RELEASE Right 05/02/2022   Procedure: RELEASE TRIGGER FINGER/A-1 PULLEY RIGHT LONG FINGER;  Surgeon: Murrell Drivers, MD;  Location: [facility];  Service: Orthopedics;  Laterality: Right;   Bier block   TUBAL LIGATION     Family History  Problem Relation Age of Onset   Heart disease Father    Coronary artery disease Brother    Diabetes Brother    Heart disease Brother    Pancreatic cancer Daughter    Diabetes Sister    Colon cancer Neg Hx    Colon polyps Neg Hx    Esophageal cancer Neg Hx    Gallbladder disease Neg Hx    Social History   Socioeconomic History   Marital status: Widowed    Spouse name: Not on file  Number of children: 3   Years of education: Not on file   Highest education level: Not on file  Occupational History   Occupation: disabled    Employer: RETIRED  Tobacco Use   Smoking status: Never   Smokeless tobacco: Never  Vaping Use   Vaping status: Never Used  Substance and Sexual Activity   Alcohol use: No    Alcohol/week: 0.0 standard drinks of alcohol   Drug use: No   Sexual activity: Not Currently  Other Topics Concern   Not on file  Social History Narrative   Not on file   Social Drivers of Health   Financial Resource Strain: Low Risk  (08/10/2023)   Overall Financial Resource Strain (CARDIA)    Difficulty of Paying Living Expenses: Not hard at all  Food Insecurity: No Food Insecurity (08/10/2023)   Hunger Vital Sign    Worried About Running Out of Food in the Last Year: Never true    Ran Out of Food in the Last Year: Never true  Transportation Needs: No Transportation Needs (08/10/2023)   PRAPARE - Administrator, Civil Service (Medical): No    Lack of Transportation (Non-Medical): No  Physical Activity: Inactive (05/17/2022)   Exercise Vital Sign    Days of Exercise per Week: 0 days    Minutes of Exercise per Session: 0 min  Stress: No Stress Concern Present (08/10/2023)   [employer] of Occupational Health - Occupational Stress Questionnaire    Feeling of Stress: Not at all  Social Connections: Patient Declined (08/10/2023)   Social Connection and Isolation Panel    Frequency of Communication with Friends  and Family: Patient declined    Frequency of Social Gatherings with Friends and Family: Patient declined    Attends Religious Services: Patient declined    Database administrator or Organizations: Patient declined    Attends Engineer, structural: Patient declined    Marital Status: Patient declined    Tobacco Counseling Counseling given: Not Answered    Clinical Intake:  Pre-visit preparation completed: Yes  Pain : 0-10 Pain Score: 8  Pain Type: Chronic pain Pain Location: Knee Pain Orientation: Left Pain Descriptors / Indicators: Constant, Aching Pain Onset: Today Pain Frequency: Constant     BMI - recorded: 27.46 Nutritional Status: BMI 25 -29 Overweight Nutritional Risks: None Diabetes: No  Lab Results  Component Value Date   HGBA1C 5.7 07/07/2023   HGBA1C 6.1 (H) 06/29/2022   HGBA1C 6.2 05/12/2021     How often do you need to have someone help you when you read instructions, pamphlets, or other written materials from your doctor or pharmacy?: 1 - Never  Interpreter Needed?: No  Information entered by :: Jonnette Nuon [email or web address]   Activities of Daily Living     08/10/2023    3:23 PM 05/07/2023    8:00 PM  In your present state of health, do you have any difficulty performing the following activities:  Hearing? 1 1  Vision? 0 0  Difficulty concentrating or making decisions? 0 0  Walking or climbing stairs? 0   Dressing or bathing? 0   Doing errands, shopping? 1 0  Preparing Food and eating ? N   Using the Toilet? N   In the past six months, have you accidently leaked urine? N   Do you have problems with loss of bowel control? N   Managing your Medications? N   Managing your Finances? N   Housekeeping or managing your Housekeeping? N  Patient Care Team: Tower, Laine LABOR, MD as PCP - Diedre Wonda Sharper, MD as PCP - Cardiology (Cardiology) Camillo Golas, MD as Consulting Physician (Ophthalmology) Gaither Anes, MD as Attending Physician  (Neurosurgery) Murrell Kuba, MD as Consulting Physician (Orthopedic Surgery) Melodi Lerner, MD as Consulting Physician (Orthopedic Surgery) Geronimo Amel, MD as Consulting Physician (Pulmonary Disease) Voncille Soulier, DDS as Referring Physician (Dentistry) Fate Morna SAILOR, [facility] (Inactive) as Pharmacist (Pharmacist) Caleen Dirks, MD as Consulting Physician (Internal Medicine)  I have updated your Care Teams any recent Medical Services you may have received from other providers in the past year.     Assessment:   This is a routine wellness examination for Aarionna.  Hearing/Vision screen Hearing Screening - Comments:: Patient wears hearing aids Vision Screening - Comments:: Patient wears glasses   Goals Addressed               This Visit's Progress     Patient Stated (pt-stated)   On track     Get lower back to get better       Depression Screen     08/10/2023    3:26 PM 07/07/2023   11:06 AM 08/15/2022   12:40 PM 06/24/2022    2:28 PM 05/17/2022    1:45 PM 05/17/2022    1:21 PM 05/12/2021   11:34 AM  PHQ 2/9 Scores  PHQ - 2 Score 2 6 0 5 4 6  0  PHQ- 9 Score 2 19 12 16 10 6      Fall Risk     08/10/2023    3:22 PM 07/07/2023   11:05 AM 08/15/2022   12:40 PM 06/24/2022    2:28 PM 05/17/2022    1:26 PM  Fall Risk   Falls in the past year? 1 1 1 1 1   Number falls in past yr: 1 1 0 1 0  Injury with Fall? 1 1 1  0 1  Comment     cracked stenum had x ray  Risk for fall due to : Impaired mobility;History of fall(s) History of fall(s) Impaired balance/gait History of fall(s) No Fall Risks  Follow up Falls evaluation completed Falls evaluation completed Falls evaluation completed Falls evaluation completed Falls prevention discussed;Education provided    MEDICARE RISK AT HOME:  Medicare Risk at Home Any stairs in or around the home?: Yes If so, are there any without handrails?: No Home free of loose throw rugs in walkways, pet beds, electrical cords, etc?: Yes Adequate  lighting in your home to reduce risk of falls?: Yes Life alert?: No Use of a cane, walker or w/c?: No Grab bars in the bathroom?: Yes Shower chair or bench in shower?: Yes Elevated toilet seat or a handicapped toilet?: Yes  TIMED UP AND GO:  Was the test performed?  No  Cognitive Function: 6CIT completed    02/09/2018    1:39 PM 02/08/2017   12:53 PM 12/14/2015   11:09 AM  MMSE - Mini Mental State Exam  Orientation to time 5 5  5    Orientation to Place 5 5  5    Registration 3 3  3    Attention/ Calculation 0 0  0   Recall 3 3  3    Language- name 2 objects 0 0  0   Language- repeat 1 1 1   Language- follow 3 step command 3 3  3    Language- read & follow direction 0 0  0   Write a sentence 0 0  0   Copy design 0  0  0   Total score 20 20  20       Data saved with a previous flowsheet row definition        08/10/2023    3:27 PM 05/17/2022    1:31 PM  6CIT Screen  What Year? 0 points 0 points  What month? 0 points 0 points  What time? 0 points 0 points  Count back from 20 0 points 0 points  Months in reverse 0 points 4 points  Repeat phrase 0 points 0 points  Total Score 0 points 4 points    Immunizations Immunization History  Administered Date(s) Administered   Influenza Split 10/17/2011   Influenza Whole 10/24/2003, 10/19/2006, 10/15/2007, 11/15/2008, 10/17/2009   Influenza, High Dose Seasonal PF 10/25/2016, 09/28/2017, 12/24/2021, 12/30/2022   Influenza, Seasonal, Injecte, Preservative Fre 09/26/2018   Influenza,inj,Quad PF,6+ Mos 09/19/2012, 11/26/2013   Influenza-Unspecified 11/10/2014, 09/18/2015   PFIZER Comirnaty(Gray Top)Covid-19 Tri-Sucrose Vaccine 06/19/2020   PFIZER(Purple Top)SARS-COV-2 Vaccination 12/17/2019, 01/07/2020   Pneumococcal Conjugate-13 05/07/2013   Pneumococcal Polysaccharide-23 01/03/2002, 06/13/2007   Td 10/09/1996, 06/13/2007   Zoster Recombinant(Shingrix) 09/26/2018, 12/19/2018   Zoster, Live 03/21/2013    Screening Tests Health  Maintenance  Topic Date Due   COVID-19 Vaccine (4 - 2024-25 season) 03/15/2024 (Originally 09/18/2022)   DTaP/Tdap/Td (3 - Tdap) 07/06/2024 (Originally 06/12/2017)   MAMMOGRAM  05/13/2031 (Originally 10/10/2020)   INFLUENZA VACCINE  08/18/2023   Medicare Annual Wellness (AWV)  08/09/2024   Pneumococcal Vaccine: 50+ Years  Completed   DEXA SCAN  Completed   Zoster Vaccines- Shingrix  Completed   Hepatitis B Vaccines  Aged Out   HPV VACCINES  Aged Out   Meningococcal B Vaccine  Aged Out    Health Maintenance  There are no preventive care reminders to display for this patient. Health Maintenance Items Addressed:   Additional Screening:  Vision Screening: Recommended annual ophthalmology exams for early detection of glaucoma and other disorders of the eye. Would you like a referral to an eye doctor? No    Dental Screening: Recommended annual dental exams for proper oral hygiene  Community Resource Referral / Chronic Care Management: CRR required this visit?  No   CCM required this visit?  No   Plan:    I have personally reviewed and noted the following in the patient's chart:   Medical and social history Use of alcohol, tobacco or illicit drugs  Current medications and supplements including opioid prescriptions. Patient is not currently taking opioid prescriptions. Functional ability and status Nutritional status Physical activity Advanced directives List of other physicians Hospitalizations, surgeries, and ER visits in previous 12 months Vitals Screenings to include cognitive, depression, and falls Referrals and appointments  In addition, I have reviewed and discussed with patient certain preventive protocols, quality metrics, and best practice recommendations. A written personalized care plan for preventive services as well as general preventive health recommendations were provided to patient.   Lyle MARLA Right, [place]   08/10/2023   After Visit Summary: (MyChart)  Due to this being a telephonic visit, the after visit summary with patients personalized plan was offered to patient via MyChart   Notes: Nothing significant to report at this time.

## 2023-08-10 NOTE — Patient Instructions (Signed)
 [name] [name] , Thank you for taking time out of your busy schedule to complete your Annual Wellness Visit with me. I enjoyed our conversation and look forward to speaking with you again next year. I, as well as your care team,  appreciate your ongoing commitment to your health goals. Please review the following plan we discussed and let me know if I can assist you in the future. Your Game plan/ To Do List    Referrals: If you haven't heard from the office you've been referred to, please reach out to them at the phone provided.  none Follow up Visits: Next Medicare AWV with our clinical staff: 08/13/2024   Have you seen your provider in the last 6 months (3 months if uncontrolled diabetes)? No Next Office Visit with your provider: none at this time   Clinician Recommendations:  Aim for 30 minutes of exercise or brisk walking, 6-8 glasses of water , and 5 servings of fruits and vegetables each day.       This is a list of the screening recommended for you and due dates:  Health Maintenance  Topic Date Due   COVID-19 Vaccine (4 - 2024-25 season) 03/15/2024*   DTaP/Tdap/Td vaccine (3 - Tdap) 07/06/2024*   Mammogram  05/13/2031*   Flu Shot  08/18/2023   Medicare Annual Wellness Visit  08/09/2024   Pneumococcal Vaccine for age over 66  Completed   DEXA scan (bone density measurement)  Completed   Zoster (Shingles) Vaccine  Completed   Hepatitis B Vaccine  Aged Out   HPV Vaccine  Aged Out   Meningitis B Vaccine  Aged Out  *Topic was postponed. The date shown is not the original due date.    Advanced directives: (Declined) Advance directive discussed with you today. Even though you declined this today, please call our office should you change your mind, and we can give you the proper paperwork for you to fill out. Advance Care Planning is important because it:  [x]  Makes sure you receive the medical care that is consistent with your values, goals, and preferences  [x]  It provides guidance  to your family and loved ones and reduces their decisional burden about whether or not they are making the right decisions based on your wishes.  Follow the link provided in your after visit summary or read over the paperwork we have mailed to you to help you started getting your Advance Directives in place. If you need assistance in completing these, please reach out to us  so that we can help you!  See attachments for Preventive Care and Fall Prevention Tips.

## 2023-08-15 DIAGNOSIS — M80052D Age-related osteoporosis with current pathological fracture, left femur, subsequent encounter for fracture with routine healing: Secondary | ICD-10-CM | POA: Diagnosis not present

## 2023-08-15 DIAGNOSIS — F32A Depression, unspecified: Secondary | ICD-10-CM | POA: Diagnosis not present

## 2023-08-15 DIAGNOSIS — I110 Hypertensive heart disease with heart failure: Secondary | ICD-10-CM | POA: Diagnosis not present

## 2023-08-15 DIAGNOSIS — G500 Trigeminal neuralgia: Secondary | ICD-10-CM | POA: Diagnosis not present

## 2023-08-15 DIAGNOSIS — K219 Gastro-esophageal reflux disease without esophagitis: Secondary | ICD-10-CM | POA: Diagnosis not present

## 2023-08-15 DIAGNOSIS — I5032 Chronic diastolic (congestive) heart failure: Secondary | ICD-10-CM | POA: Diagnosis not present

## 2023-08-17 DIAGNOSIS — I5032 Chronic diastolic (congestive) heart failure: Secondary | ICD-10-CM | POA: Diagnosis not present

## 2023-08-17 DIAGNOSIS — M80052D Age-related osteoporosis with current pathological fracture, left femur, subsequent encounter for fracture with routine healing: Secondary | ICD-10-CM | POA: Diagnosis not present

## 2023-08-17 DIAGNOSIS — F32A Depression, unspecified: Secondary | ICD-10-CM | POA: Diagnosis not present

## 2023-08-17 DIAGNOSIS — K219 Gastro-esophageal reflux disease without esophagitis: Secondary | ICD-10-CM | POA: Diagnosis not present

## 2023-08-17 DIAGNOSIS — I110 Hypertensive heart disease with heart failure: Secondary | ICD-10-CM | POA: Diagnosis not present

## 2023-08-17 DIAGNOSIS — G500 Trigeminal neuralgia: Secondary | ICD-10-CM | POA: Diagnosis not present

## 2023-08-21 DIAGNOSIS — M80052D Age-related osteoporosis with current pathological fracture, left femur, subsequent encounter for fracture with routine healing: Secondary | ICD-10-CM | POA: Diagnosis not present

## 2023-08-21 DIAGNOSIS — F32A Depression, unspecified: Secondary | ICD-10-CM | POA: Diagnosis not present

## 2023-08-21 DIAGNOSIS — K219 Gastro-esophageal reflux disease without esophagitis: Secondary | ICD-10-CM | POA: Diagnosis not present

## 2023-08-21 DIAGNOSIS — I5032 Chronic diastolic (congestive) heart failure: Secondary | ICD-10-CM | POA: Diagnosis not present

## 2023-08-21 DIAGNOSIS — I110 Hypertensive heart disease with heart failure: Secondary | ICD-10-CM | POA: Diagnosis not present

## 2023-08-21 DIAGNOSIS — G500 Trigeminal neuralgia: Secondary | ICD-10-CM | POA: Diagnosis not present

## 2023-08-22 DIAGNOSIS — M80052D Age-related osteoporosis with current pathological fracture, left femur, subsequent encounter for fracture with routine healing: Secondary | ICD-10-CM | POA: Diagnosis not present

## 2023-08-22 DIAGNOSIS — H353131 Nonexudative age-related macular degeneration, bilateral, early dry stage: Secondary | ICD-10-CM | POA: Diagnosis not present

## 2023-08-22 DIAGNOSIS — I110 Hypertensive heart disease with heart failure: Secondary | ICD-10-CM | POA: Diagnosis not present

## 2023-08-22 DIAGNOSIS — G500 Trigeminal neuralgia: Secondary | ICD-10-CM | POA: Diagnosis not present

## 2023-08-22 DIAGNOSIS — F32A Depression, unspecified: Secondary | ICD-10-CM | POA: Diagnosis not present

## 2023-08-22 DIAGNOSIS — H5201 Hypermetropia, right eye: Secondary | ICD-10-CM | POA: Diagnosis not present

## 2023-08-22 DIAGNOSIS — H52201 Unspecified astigmatism, right eye: Secondary | ICD-10-CM | POA: Diagnosis not present

## 2023-08-22 DIAGNOSIS — K219 Gastro-esophageal reflux disease without esophagitis: Secondary | ICD-10-CM | POA: Diagnosis not present

## 2023-08-22 DIAGNOSIS — I5032 Chronic diastolic (congestive) heart failure: Secondary | ICD-10-CM | POA: Diagnosis not present

## 2023-08-28 ENCOUNTER — Other Ambulatory Visit (INDEPENDENT_AMBULATORY_CARE_PROVIDER_SITE_OTHER): Payer: Self-pay

## 2023-08-28 ENCOUNTER — Encounter: Payer: Self-pay | Admitting: Orthopedic Surgery

## 2023-08-28 ENCOUNTER — Ambulatory Visit (INDEPENDENT_AMBULATORY_CARE_PROVIDER_SITE_OTHER): Admitting: Orthopedic Surgery

## 2023-08-28 DIAGNOSIS — M25562 Pain in left knee: Secondary | ICD-10-CM

## 2023-08-28 DIAGNOSIS — G8929 Other chronic pain: Secondary | ICD-10-CM

## 2023-08-28 MED ORDER — PREDNISONE 10 MG PO TABS
20.0000 mg | ORAL_TABLET | Freq: Every day | ORAL | 3 refills | Status: AC
  Filled 2023-12-11 – 2023-12-21 (×3): qty 60, 30d supply, fill #0

## 2023-08-28 NOTE — Progress Notes (Signed)
 Office Visit Note   Patient: [name] [name]           Date of Birth: 06/02/1934           MRN: [id number] Visit Date: 08/28/2023              Requested by: Tower, Laine LABOR, MD [address] [place],  [place] [address] PCP: Randeen, Laine LABOR, MD  Chief Complaint  Patient presents with   Left Knee - Pain      HPI: Patient is an 89 year old woman who is seen for initial evaluation for her left knee.  She is status post left total knee arthroplasty January 2016.  Patient recently has fallen Easter Sunday and has persistent pain in the left knee.  Patient states that after the initial total knee arthroplasty in 2016 with Dr. Hiram she had an acute patella tendon rupture during therapy.  She states that after patella tendon reconstruction she had further rerupture of the tendon.  Patient states she has pain all the time with the left knee she states she is in pain management for her back with multiple back surgeries and retained hardware.  Assessment & Plan: Visit Diagnoses:  1. Chronic pain of left knee     Plan: Discussed that there are several options.  1 would be to proceed with a fusion discussed this would be difficult for her to get in and out of the car and this would be a large operation with difficulty healing.  Second option would be to consider a above-knee amputation discussed that this will also be difficult because patient most likely would not have sufficient strength to be able to ambulate with a prosthesis.  The third option is to continue conservative therapy recommended a knee brace and a prescription is provided for prednisone  20 mg with breakfast.  Follow-Up Instructions: Return if symptoms worsen or fail to improve.   Ortho Exam  Patient is alert, oriented, no adenopathy, well-dressed, normal affect, normal respiratory effort. Examination patient has no active extension of the left knee.  She has patella alta.  There is a large soft tissue defect from the  patella to the tibial tubercle.  Patient does have venous swelling.  Recommended using a brace with compression socks to help decrease the venous swelling.  She has a good dorsalis pedis pulse.    Imaging: XR Knee 1-2 Views Left Result Date: 08/28/2023 2 view radiographs of the left knee shows total knee arthroplasty with patella Ulta with a large defect of the anterior distal femur.  No images are attached to the encounter.  Labs: Lab Results  Component Value Date   HGBA1C 5.7 07/07/2023   HGBA1C 6.1 (H) 06/29/2022   HGBA1C 6.2 05/12/2021   ESRSEDRATE 12 06/27/2018   ESRSEDRATE 9 02/18/2015   ESRSEDRATE 10 06/26/2008     Lab Results  Component Value Date   ALBUMIN 4.2 07/07/2023   ALBUMIN 4.5 06/29/2022   ALBUMIN 4.0 05/05/2022    Lab Results  Component Value Date   MG 2.1 08/02/2006   Lab Results  Component Value Date   VD25OH 63.45 07/07/2023   VD25OH 37.8 06/29/2022   VD25OH 45.37 05/12/2021    No results found for: PREALBUMIN    Latest Ref Rng & Units 07/07/2023   11:42 AM 05/12/2023    3:15 AM 05/11/2023    3:34 AM  CBC EXTENDED  WBC 4.0 - 10.5 K/uL 6.3  6.3  6.5   RBC 3.87 -  5.11 Mil/uL 4.74  2.87  2.83   Hemoglobin 12.0 - 15.0 g/dL 87.5  8.2  8.0   HCT 63.9 - 46.0 % 38.8  26.4  26.0   Platelets 150.0 - 400.0 K/uL 352.0  272  218   NEUT# 1.4 - 7.7 K/uL 3.4  3.5    Lymph# 0.7 - 4.0 K/uL 1.8  1.8       There is no height or weight on file to calculate BMI.  Orders:  Orders Placed This Encounter  Procedures   XR Knee 1-2 Views Left   Meds ordered this encounter  Medications   predniSONE  (DELTASONE ) 10 MG tablet    Sig: Take 2 tablets (20 mg total) by mouth daily with breakfast.    Dispense:  60 tablet    Refill:  3     Procedures: No procedures performed  Clinical Data: No additional findings.  ROS:  All other systems negative, except as noted in the HPI. Review of Systems  Objective: Vital Signs: There were no vitals taken for  this visit.  Specialty Comments:  No specialty comments available.  PMFS History: Patient Active Problem List   Diagnosis Date Noted   Chronic pain 07/07/2023   Hip fracture (HCC) 05/07/2023   Arthritis of both hands 08/15/2022   Cerumen impaction 06/24/2022   Right shoulder pain 12/28/2021   Esophageal dysphagia 12/06/2021   Current use of proton pump inhibitor 05/12/2021   Precordial chest pain 05/07/2021   Aortic atherosclerosis (HCC) 05/07/2021   Coronary artery calcification seen on CT scan 05/07/2021   Myalgia 12/09/2019   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 12/09/2019   Chronic tension-type headache, not intractable 09/06/2019   Ageusia 09/06/2019   Anosmia 09/06/2019   Trigeminal neuralgia 03/15/2019   Mobility impaired 12/11/2018   Poor balance 08/13/2018   Memory loss 06/27/2018   Physical deconditioning 03/19/2018   Chronic heart failure with preserved ejection fraction (HCC) 02/09/2018   Prediabetes 01/26/2017   Hearing loss 01/19/2016   Carpal tunnel syndrome 08/31/2015   Encounter for Medicare annual wellness exam 06/04/2014   Obesity 06/04/2014   Estrogen deficiency 06/04/2014   Schatzki's ring 03/27/2014   OA (osteoarthritis) of knee 08/16/2011   Hemorrhoids 08/10/2011   Other dyspnea and respiratory abnormality 06/21/2010   Mixed incontinence 05/27/2010   OVERACTIVE BLADDER 03/12/2010   Lumbar degenerative disc disease 04/13/2009   Chronic GERD 10/15/2008   History of cardiovascular disorder 10/15/2008   Arthropathy, multiple sites 06/26/2008   External hemorrhoids 02/01/2008   Diaphragmatic hernia 02/01/2008   Diverticulosis of colon 02/01/2008   GASTRITIS, HX OF 02/01/2008   DYSPNEA 09/19/2007   Vitamin D  deficiency 07/03/2007   Osteoporosis of lumbar spine 06/13/2007   Fatigue 06/13/2007   History of Helicobacter pylori infection 06/06/2006   Hyperlipidemia 06/06/2006   Generalized anxiety disorder 06/06/2006   Adjustment  disorder with mixed anxiety and depressed mood 06/06/2006   Macular degeneration (senile) of retina 06/06/2006   H/O: CVA (cerebrovascular accident) 06/06/2006   Osteoarthritis 06/06/2006   Past Medical History:  Diagnosis Date   Alternating constipation and diarrhea    Anxiety state, unspecified    CHF (congestive heart failure) (HCC)    Complication of anesthesia    slow to wake up with surgery several years ago    COVID-19 virus infection 12/2018   Depressive disorder, not elsewhere classified    Diaphragmatic hernia without mention of obstruction or gangrene    Difficulty sleeping    Diverticulosis of colon (without  mention of hemorrhage)    Esophageal reflux    External hemorrhoids without mention of complication    Family history of adverse reaction to anesthesia    Dad very slow to wake upi   Fatigue    Frequency of urination    Gallstones    Headache    Helicobacter pylori (H. pylori)    Hiatal hernia    History of nuclear stress test    Myoview  05/2021: EF 70, normal perfusion; low risk   History of transfusion    Lumbar spondylosis    Macular degeneration    Macular degeneration (senile) of retina, unspecified    Osteoarthrosis, unspecified whether generalized or localized, unspecified site 08/11/2011   hx. rhematoid arthritis, osteoarthritis, DDD, bursitis(hip)   Osteoporosis    Palpitations    PONV (postoperative nausea and vomiting)    Schatzki's ring    Shortness of breath 08/11/2011   with exertion only at present   Unspecified cerebral artery occlusion with cerebral infarction 08/11/2011   '97/ '06( TIA)-affected lt. side, slight weakness L side   Unspecified vitamin D  deficiency     Family History  Problem Relation Age of Onset   Heart disease Father    Coronary artery disease Brother    Diabetes Brother    Heart disease Brother    Pancreatic cancer Daughter    Diabetes Sister    Colon cancer Neg Hx    Colon polyps Neg Hx    Esophageal cancer  Neg Hx    Gallbladder disease Neg Hx     Past Surgical History:  Procedure Laterality Date   ABDOMINAL HYSTERECTOMY  08-11-11   APPENDECTOMY     BACK SURGERY  08-11-11   '11-hx. lumbar fusion with retained hardware   CARPAL TUNNEL RELEASE Right 05/02/2022   Procedure: RIGHT CARPAL TUNNEL RELEASE;  Surgeon: Murrell Drivers, MD;  Location: [facility];  Service: Orthopedics;  Laterality: Right;  Bier block   CATARACT EXTRACTION  08-11-11   Bilateral   ESOPHAGEAL DILATION     ESOPHAGOGASTRODUODENOSCOPY (EGD) WITH PROPOFOL  N/A 12/06/2021   Procedure: ESOPHAGOGASTRODUODENOSCOPY (EGD) WITH PROPOFOL ;  Surgeon: Stacia Glendia BRAVO, MD;  Location: [facility];  Service: Gastroenterology;  Laterality: N/A;   INTRAMEDULLARY (IM) NAIL INTERTROCHANTERIC Left 05/09/2023   Procedure: FIXATION, FRACTURE, INTERTROCHANTERIC, WITH INTRAMEDULLARY ROD;  Surgeon: Ernie Cough, MD;  Location: [facility];  Service: Orthopedics;  Laterality: Left;   KNEE SURGERY  08-11-11   rt. knee scope   PATELLAR TENDON REPAIR Left 02/22/2014   Procedure: PATELLA TENDON REPAIR;  Surgeon: Dempsey Melodi GAILS, MD;  Location: [facility];  Service: Orthopedics;  Laterality: Left;   PATELLAR TENDON REPAIR Left 06/25/2014   Procedure: LEFT PATELLA TENDON REPAIR;  Surgeon: Dempsey Melodi, MD;  Location: [facility];  Service: Orthopedics;  Laterality: Left;   SAVORY DILATION N/A 12/06/2021   Procedure: SAVORY DILATION;  Surgeon: Stacia Glendia BRAVO, MD;  Location: [facility];  Service: Gastroenterology;  Laterality: N/A;   TOTAL KNEE ARTHROPLASTY  08/16/2011   Procedure: TOTAL KNEE ARTHROPLASTY;  Surgeon: Dempsey GAILS Melodi, MD;  Location: [facility];  Service: Orthopedics;  Laterality: Right;   TOTAL KNEE ARTHROPLASTY Left 01/27/2014   Procedure: LEFT TOTAL KNEE ARTHROPLASTY;  Surgeon: Dempsey Melodi GAILS, MD;  Location: [facility];  Service: Orthopedics;  Laterality: Left;   TRIGGER FINGER RELEASE Right 05/02/2022   Procedure: RELEASE TRIGGER FINGER/A-1  PULLEY RIGHT LONG FINGER;  Surgeon: Murrell Drivers, MD;  Location: [facility];  Service: Orthopedics;  Laterality: Right;  Bier block   TUBAL LIGATION     Social History   Occupational History   Occupation: disabled    Employer: RETIRED  Tobacco Use   Smoking status: Never   Smokeless tobacco: Never  Vaping Use   Vaping status: Never Used  Substance and Sexual Activity   Alcohol use: No    Alcohol/week: 0.0 standard drinks of alcohol   Drug use: No   Sexual activity: Not Currently

## 2023-08-29 DIAGNOSIS — M80052D Age-related osteoporosis with current pathological fracture, left femur, subsequent encounter for fracture with routine healing: Secondary | ICD-10-CM | POA: Diagnosis not present

## 2023-08-29 DIAGNOSIS — G500 Trigeminal neuralgia: Secondary | ICD-10-CM | POA: Diagnosis not present

## 2023-08-29 DIAGNOSIS — F32A Depression, unspecified: Secondary | ICD-10-CM | POA: Diagnosis not present

## 2023-08-29 DIAGNOSIS — I5032 Chronic diastolic (congestive) heart failure: Secondary | ICD-10-CM | POA: Diagnosis not present

## 2023-08-29 DIAGNOSIS — K219 Gastro-esophageal reflux disease without esophagitis: Secondary | ICD-10-CM | POA: Diagnosis not present

## 2023-08-29 DIAGNOSIS — I110 Hypertensive heart disease with heart failure: Secondary | ICD-10-CM | POA: Diagnosis not present

## 2023-08-30 ENCOUNTER — Encounter (HOSPITAL_COMMUNITY): Payer: Self-pay | Admitting: Psychiatry

## 2023-08-30 ENCOUNTER — Other Ambulatory Visit: Payer: Self-pay

## 2023-08-30 ENCOUNTER — Ambulatory Visit (HOSPITAL_BASED_OUTPATIENT_CLINIC_OR_DEPARTMENT_OTHER): Admitting: Psychiatry

## 2023-08-30 VITALS — BP 119/69 | HR 72 | Ht 64.0 in | Wt 160.0 lb

## 2023-08-30 DIAGNOSIS — K449 Diaphragmatic hernia without obstruction or gangrene: Secondary | ICD-10-CM | POA: Diagnosis not present

## 2023-08-30 DIAGNOSIS — I110 Hypertensive heart disease with heart failure: Secondary | ICD-10-CM | POA: Diagnosis not present

## 2023-08-30 DIAGNOSIS — M80052D Age-related osteoporosis with current pathological fracture, left femur, subsequent encounter for fracture with routine healing: Secondary | ICD-10-CM | POA: Diagnosis not present

## 2023-08-30 DIAGNOSIS — K219 Gastro-esophageal reflux disease without esophagitis: Secondary | ICD-10-CM | POA: Diagnosis not present

## 2023-08-30 DIAGNOSIS — E785 Hyperlipidemia, unspecified: Secondary | ICD-10-CM | POA: Diagnosis not present

## 2023-08-30 DIAGNOSIS — G629 Polyneuropathy, unspecified: Secondary | ICD-10-CM | POA: Diagnosis not present

## 2023-08-30 DIAGNOSIS — F4323 Adjustment disorder with mixed anxiety and depressed mood: Secondary | ICD-10-CM | POA: Diagnosis not present

## 2023-08-30 DIAGNOSIS — H3530 Unspecified macular degeneration: Secondary | ICD-10-CM | POA: Diagnosis not present

## 2023-08-30 DIAGNOSIS — Z7952 Long term (current) use of systemic steroids: Secondary | ICD-10-CM | POA: Diagnosis not present

## 2023-08-30 DIAGNOSIS — I2510 Atherosclerotic heart disease of native coronary artery without angina pectoris: Secondary | ICD-10-CM | POA: Diagnosis not present

## 2023-08-30 DIAGNOSIS — F320 Major depressive disorder, single episode, mild: Secondary | ICD-10-CM

## 2023-08-30 DIAGNOSIS — Z7902 Long term (current) use of antithrombotics/antiplatelets: Secondary | ICD-10-CM | POA: Diagnosis not present

## 2023-08-30 DIAGNOSIS — G8929 Other chronic pain: Secondary | ICD-10-CM | POA: Diagnosis not present

## 2023-08-30 DIAGNOSIS — Z79891 Long term (current) use of opiate analgesic: Secondary | ICD-10-CM | POA: Diagnosis not present

## 2023-08-30 DIAGNOSIS — Z9981 Dependence on supplemental oxygen: Secondary | ICD-10-CM | POA: Diagnosis not present

## 2023-08-30 DIAGNOSIS — H6123 Impacted cerumen, bilateral: Secondary | ICD-10-CM | POA: Diagnosis not present

## 2023-08-30 DIAGNOSIS — H73011 Bullous myringitis, right ear: Secondary | ICD-10-CM | POA: Diagnosis not present

## 2023-08-30 DIAGNOSIS — Z9181 History of falling: Secondary | ICD-10-CM | POA: Diagnosis not present

## 2023-08-30 DIAGNOSIS — F411 Generalized anxiety disorder: Secondary | ICD-10-CM | POA: Diagnosis not present

## 2023-08-30 DIAGNOSIS — I5032 Chronic diastolic (congestive) heart failure: Secondary | ICD-10-CM | POA: Diagnosis not present

## 2023-08-30 DIAGNOSIS — F32A Depression, unspecified: Secondary | ICD-10-CM | POA: Diagnosis not present

## 2023-08-30 DIAGNOSIS — G500 Trigeminal neuralgia: Secondary | ICD-10-CM | POA: Diagnosis not present

## 2023-08-30 MED ORDER — BUPROPION HCL ER (XL) 150 MG PO TB24: 150.0000 mg | ORAL_TABLET | Freq: Every day | ORAL | 4 refills | Status: DC

## 2023-08-30 NOTE — Progress Notes (Signed)
 Psychiatric Initial Adult Assessment   Patient Identification: [name] [name] MRN:  [id number] Date of Evaluation:  08/30/2023 Referral Source: Dr Laine Balls Chief Complaint: Depression  This patient is an 89 year old white mother who is a widow for the last 4 to 5 years.  In the last 2 to 3 years she has been experiencing increasing depression on a persistent daily basis.  She has been on Zoloft  maximum dose for years and in the last year started Wellbutrin  now taking 300 mg.  The patient says in general her sleep has not changed.  Her appetite now is stable.  Over the last year or so she has lost a great deal of weight but it stabilized.  Her energy level was reasonably normal.  She still has an ability to think and concentrate.  For enjoyment he likes to read but little else.  This questions about her level of worth.  Her psychomotor functioning is normal.  The patient is not suicidal now and never has been.  The patient is very involved with her sister who is 60 years of age who she talks to every day.  She has another good friend named Administrator, sports.  2 months ago things seem to gotten worse when she fell and broke her hip.  She is now dependent upon a wheelchair.  Generally this patient is very active and independent and she has herself being overly dependent on people around her.  She has chronic pain and takes tramadol  twice daily.  Recently she had her Neurontin  increased for pain.  The patient denies use of alcohol or drugs.  She has never had any psychosis.  There are no specific symptoms consistent with generalized anxiety disorder, panic disorder or OCD.  She has a questionable history of major depression.  Her primary care doctor started her on Zoloft  after she had a stroke in 1998.  At that time another relative it died.  Today the patient complains of multiple deaths over the years which she thinks contributes to her sadness.  Today she is seen with her daughter [name] [name].  The patient lives with her  son.  The patient is just learned there there is no significant surgical intervention for very damaged knee. Her past psychiatric history is the fact that she has been on Zoloft  and now Wellbutrin  for well over a year.  She is never been in a psychiatric hospital.  There was a period for 6 months where she was not talking therapy. Visit Diagnosis:    ICD-10-CM   1. Adjustment disorder with mixed anxiety and depressed mood  F43.23 buPROPion  (WELLBUTRIN  XL) 150 MG 24 hr tablet      History of Present Illness:    Associated Signs/Symptoms: Depression Symptoms:  depressed mood, (Hypo) Manic Symptoms:   Anxiety Symptoms:   Psychotic Symptoms:   PTSD Symptoms: NA  Past Psychiatric History:   Previous Psychotropic Medications: Yes   Substance Abuse History in te last 12 months:  No.  Consequences of Substance Abuse: NA  Past Medical History:  Past Medical History:  Diagnosis Date   Alternating constipation and diarrhea    Anxiety state, unspecified    CHF (congestive heart failure) (HCC)    Complication of anesthesia    slow to wake up with surgery several years ago    COVID-19 virus infection 12/2018   Depressive disorder, not elsewhere classified    Diaphragmatic hernia without mention of obstruction or gangrene    Difficulty sleeping    Diverticulosis of colon (  without mention of hemorrhage)    Esophageal reflux    External hemorrhoids without mention of complication    Family history of adverse reaction to anesthesia    Dad very slow to wake upi   Fatigue    Frequency of urination    Gallstones    Headache    Helicobacter pylori (H. pylori)    Hiatal hernia    History of nuclear stress test    Myoview  05/2021: EF 70, normal perfusion; low risk   History of transfusion    Lumbar spondylosis    Macular degeneration    Macular degeneration (senile) of retina, unspecified    Osteoarthrosis, unspecified whether generalized or localized, unspecified site 08/11/2011    hx. rhematoid arthritis, osteoarthritis, DDD, bursitis(hip)   Osteoporosis    Palpitations    PONV (postoperative nausea and vomiting)    Schatzki's ring    Shortness of breath 08/11/2011   with exertion only at present   Unspecified cerebral artery occlusion with cerebral infarction 08/11/2011   '97/ '06( TIA)-affected lt. side, slight weakness L side   Unspecified vitamin D  deficiency     Past Surgical History:  Procedure Laterality Date   ABDOMINAL HYSTERECTOMY  08-11-11   APPENDECTOMY     BACK SURGERY  08-11-11   '11-hx. lumbar fusion with retained hardware   CARPAL TUNNEL RELEASE Right 05/02/2022   Procedure: RIGHT CARPAL TUNNEL RELEASE;  Surgeon: Murrell Drivers, MD;  Location: [facility];  Service: Orthopedics;  Laterality: Right;  Bier block   CATARACT EXTRACTION  08-11-11   Bilateral   ESOPHAGEAL DILATION     ESOPHAGOGASTRODUODENOSCOPY (EGD) WITH PROPOFOL  N/A 12/06/2021   Procedure: ESOPHAGOGASTRODUODENOSCOPY (EGD) WITH PROPOFOL ;  Surgeon: Stacia Glendia BRAVO, MD;  Location: [facility];  Service: Gastroenterology;  Laterality: N/A;   INTRAMEDULLARY (IM) NAIL INTERTROCHANTERIC Left 05/09/2023   Procedure: FIXATION, FRACTURE, INTERTROCHANTERIC, WITH INTRAMEDULLARY ROD;  Surgeon: Ernie Cough, MD;  Location: [facility];  Service: Orthopedics;  Laterality: Left;   KNEE SURGERY  08-11-11   rt. knee scope   PATELLAR TENDON REPAIR Left 02/22/2014   Procedure: PATELLA TENDON REPAIR;  Surgeon: Dempsey Melodi GAILS, MD;  Location: [facility];  Service: Orthopedics;  Laterality: Left;   PATELLAR TENDON REPAIR Left 06/25/2014   Procedure: LEFT PATELLA TENDON REPAIR;  Surgeon: Dempsey Melodi, MD;  Location: [facility];  Service: Orthopedics;  Laterality: Left;   SAVORY DILATION N/A 12/06/2021   Procedure: SAVORY DILATION;  Surgeon: Stacia Glendia BRAVO, MD;  Location: [facility];  Service: Gastroenterology;  Laterality: N/A;   TOTAL KNEE ARTHROPLASTY  08/16/2011   Procedure: TOTAL KNEE  ARTHROPLASTY;  Surgeon: Dempsey GAILS Melodi, MD;  Location: [facility];  Service: Orthopedics;  Laterality: Right;   TOTAL KNEE ARTHROPLASTY Left 01/27/2014   Procedure: LEFT TOTAL KNEE ARTHROPLASTY;  Surgeon: Dempsey Melodi GAILS, MD;  Location: [facility];  Service: Orthopedics;  Laterality: Left;   TRIGGER FINGER RELEASE Right 05/02/2022   Procedure: RELEASE TRIGGER FINGER/A-1 PULLEY RIGHT LONG FINGER;  Surgeon: Murrell Drivers, MD;  Location: [facility];  Service: Orthopedics;  Laterality: Right;  Bier block   TUBAL LIGATION      Family Psychiatric History:   Family History:  Family History  Problem Relation Age of Onset   Heart disease Father    Coronary artery disease Brother    Diabetes Brother    Heart disease Brother    Pancreatic cancer Daughter    Diabetes Sister    Colon cancer Neg Hx  Colon polyps Neg Hx    Esophageal cancer Neg Hx    Gallbladder disease Neg Hx     Social History:   Social History   Socioeconomic History   Marital status: Widowed    Spouse name: Not on file   Number of children: 3   Years of education: Not on file   Highest education level: Not on file  Occupational History   Occupation: disabled    Employer: RETIRED  Tobacco Use   Smoking status: Never   Smokeless tobacco: Never  Vaping Use   Vaping status: Never Used  Substance and Sexual Activity   Alcohol use: No    Alcohol/week: 0.0 standard drinks of alcohol   Drug use: No   Sexual activity: Not Currently  Other Topics Concern   Not on file  Social History Narrative   Not on file   Social Drivers of Health   Financial Resource Strain: Low Risk  (08/10/2023)   Overall Financial Resource Strain (CARDIA)    Difficulty of Paying Living Expenses: Not hard at all  Food Insecurity: No Food Insecurity (08/10/2023)   Hunger Vital Sign    Worried About Running Out of Food in the Last Year: Never true    Ran Out of Food in the Last Year: Never true  Transportation Needs: No Transportation  Needs (08/10/2023)   PRAPARE - Administrator, Civil Service (Medical): No    Lack of Transportation (Non-Medical): No  Physical Activity: Inactive (05/17/2022)   Exercise Vital Sign    Days of Exercise per Week: 0 days    Minutes of Exercise per Session: 0 min  Stress: No Stress Concern Present (08/10/2023)   [employer] of Occupational Health - Occupational Stress Questionnaire    Feeling of Stress: Not at all  Social Connections: Patient Declined (08/10/2023)   Social Connection and Isolation Panel    Frequency of Communication with Friends and Family: Patient declined    Frequency of Social Gatherings with Friends and Family: Patient declined    Attends Religious Services: Patient declined    Database administrator or Organizations: Patient declined    Attends Banker Meetings: Patient declined    Marital Status: Patient declined    Additional Social History:   Allergies:   Allergies  Allergen Reactions   Alendronate Sodium Other (See Comments)    JAW PAIN   Celecoxib Other (See Comments)    GI UPSET   Chocolate Diarrhea   Omeprazole Nausea Only   Rabeprazole Nausea And Vomiting   Latex Itching, Dermatitis and Rash   Sulfa  Antibiotics Rash and Dermatitis    Metabolic Disorder Labs: Lab Results  Component Value Date   HGBA1C 5.7 07/07/2023   MPG 133 04/25/2007   No results found for: PROLACTIN Lab Results  Component Value Date   CHOL 173 07/07/2023   TRIG 102.0 07/07/2023   HDL 71.40 07/07/2023   CHOLHDL 2 07/07/2023   VLDL 20.4 07/07/2023   LDLCALC 81 07/07/2023   LDLCALC 90 06/29/2022   Lab Results  Component Value Date   TSH 2.69 07/07/2023    Therapeutic Level Labs: No results found for: LITHIUM No results found for: CBMZ No results found for: VALPROATE  Current Medications: Current Outpatient Medications  Medication Sig Dispense Refill   acetaminophen  (TYLENOL ) 500 MG tablet Take 2 tablets (1,000 mg total) by  mouth every 8 (eight) hours.     alum & mag hydroxide-simeth (MAALOX/MYLANTA) 200-200-20 MG/5ML suspension Take 30 mLs  by mouth every 4 (four) hours as needed for indigestion.     atorvastatin  (LIPITOR) 10 MG tablet TAKE 1 TABLET DAILY 90 tablet 2   calcium  carbonate (TUMS - DOSED IN MG ELEMENTAL CALCIUM ) 500 MG chewable tablet Chew 1 tablet (200 mg of elemental calcium  total) by mouth 4 (four) times daily as needed for indigestion or heartburn. 10 tablet 0   clopidogrel  (PLAVIX ) 75 MG tablet Take 1 tablet (75 mg total) by mouth daily. 90 tablet 3   diclofenac sodium (VOLTAREN) 1 % GEL Apply 2-4 g topically at bedtime as needed (for pain- affected areas).     estradiol  (ESTRACE ) 0.1 MG/GM vaginal cream APPLY A PEA SIZED AMOUNT OF CREAM VAGINALLY TWICE A WEEK 42.5 g 2   famotidine  (PEPCID ) 40 MG tablet TAKE 1 TABLET DAILY 90 tablet 3   furosemide  (LASIX ) 20 MG tablet TAKE 1 TABLET DAILY 90 tablet 1   gabapentin  (NEURONTIN ) 100 MG capsule Take 2 capsules (200 mg total) by mouth at bedtime.     IMODIUM A-D 2 MG tablet Take 2 mg by mouth 4 (four) times daily as needed for diarrhea or loose stools.     Liniments (ABSORBINE ARTHRITIS STRENGTH EX) Apply 1 application  topically daily as needed (Back pain).     melatonin 3 MG TABS tablet Take 1 tablet (3 mg total) by mouth at bedtime as needed.     methocarbamol  (ROBAXIN ) 500 MG tablet Take 1 tablet (500 mg total) by mouth every 8 (eight) hours as needed for muscle spasms. Caution of sedation 30 tablet 0   metoprolol  succinate (TOPROL -XL) 25 MG 24 hr tablet TAKE ONE-HALF (1/2) TABLET AT BEDTIME 45 tablet 1   nitroGLYCERIN  (NITROSTAT ) 0.4 MG SL tablet Place 1 tablet (0.4 mg total) under the tongue every 5 (five) minutes as needed for chest pain. 25 tablet 3   pantoprazole  (PROTONIX ) 40 MG tablet Take 1 tablet (40 mg total) by mouth 2 (two) times daily. 180 tablet 1   Polyethyl Glycol-Propyl Glycol (SYSTANE OP) Place 1-2 drops into both eyes 3 (three) times  daily as needed (for dryness).     polyethylene glycol (MIRALAX  / GLYCOLAX ) 17 g packet Take 17 g by mouth daily as needed.     predniSONE  (DELTASONE ) 10 MG tablet Take 2 tablets (20 mg total) by mouth daily with breakfast. 60 tablet 3   senna (SENOKOT) 8.6 MG TABS tablet Take 2 tablets (17.2 mg total) by mouth at bedtime.     sertraline  (ZOLOFT ) 100 MG tablet Take 2 tablets (200 mg total) by mouth daily. 180 tablet 1   traMADol  (ULTRAM ) 50 MG tablet Take 1 tablet (50 mg total) by mouth every 12 (twelve) hours as needed for severe pain (pain score 7-10). Caution of sedation 15 tablet 0   APPLE CIDER VINEGAR PO Take 2 tablets by mouth See admin instructions. Chew 2 gummies by mouth at bedtime (Patient not taking: Reported on 08/30/2023)     buPROPion  (WELLBUTRIN  XL) 150 MG 24 hr tablet Take 1 tablet (150 mg total) by mouth daily. 3 qam 90 tablet 4   Current Facility-Administered Medications  Medication Dose Route Frequency Provider Last Rate Last Admin   denosumab  (PROLIA ) injection 60 mg  60 mg Subcutaneous Once Tower, Laine LABOR, MD        Musculoskeletal: Strength & Muscle Tone: decreased Gait & Station: unsteady Patient leans: Right  Psychiatric Specialty Exam: Review of Systems  Blood pressure 119/69, pulse 72, height 5' 4 (1.626 m), weight  160 lb (72.6 kg).Body mass index is 27.46 kg/m.  General Appearance: Casual  Eye Contact:  Good  Speech:  Clear and Coherent  Volume:  Normal  Mood:  Anxious  Affect:  Appropriate  Thought Process:  Coherent  Orientation:  Full (Time, Place, and Person)  Thought Content:  WDL  Suicidal Thoughts:  No  Homicidal Thoughts:  No  Memory:  NA  Judgement:  Good  Insight:  Good  Psychomotor Activity:  Decreased  Concentration:    Recall:  Good  Fund of Knowledge:Good  Language: Good  Akathisia:  NA  Handed:  Right  AIMS (if indicated):  not done  Assets:  Desire for Improvement  ADL's:  Intact  Cognition: WNL  Sleep:  Good    Screenings: GAD-7    Flowsheet Row Office Visit from [facility]/07/2023 in [facility] Office Visit from 08/15/2022 in [facility] [place] Office Visit from 06/24/2022 in [facility] [place] [facility]  Total GAD-7 Score 21 21 19    Mini-Mental    Flowsheet Row Clinical Support from 02/09/2018 in [facility] Clinical Support from 02[facility]08/2017 in [facility] [place] [facility] Clinical Support from 12/14/2015 in [facility] [place] [facility]  Total Score (max 30 points ) 20 20 20    PHQ2-9    Flowsheet Row Clinical Support from 08/10/2023 in [facility] [place] Office Visit from 07/07/2023 in [facility] [place] Office Visit from 08/15/2022 in [facility] Office Visit from 06/24/2022 in [facility] [place] [facility] Clinical Support from 05/17/2022 in [facility] [place] [facility] [place]  PHQ-2 Total Score 2 6 0 5 4  PHQ-9 Total Score 2 19 12 16 10    Flowsheet Row ED to Hosp-Admission (Discharged) from 05/07/2023 in [facility] ED from 05/05/2022 in [facility] Emergency Department at [facility] Admission (Discharged) from 05/02/2022 in [facility]  C-SSRS RISK CATEGORY No Risk No Risk No Risk    Assessment and Plan:   At this time this patient is most likely diagnosis is major depression.  Most likely a persistent depression disorder.  We will make an attempt to better define an episode of major depression in the past.  For now she has been taking 200 mg of Zoloft  which we will continue.  Today we will increase her Wellbutrin  to the maximum dose of 450 mg.  The most important intervention also is to get her pain under better control.  We have asked her to contact Dr. Bonner to evaluate her pain to look for a better more effective  intervention.  She takes tramadol  100 mg twice daily.  According to the family since she has been on muscle relaxers cognitively she has not been assured.  She apparently is more fatigued and energyless.  Recently she just been started on prednisone  in the last 48 hours.  I believe the combination of prednisone , muscle relaxers Neurontin  can all affect her emotional energy.  I think reducing some of these medicines might be important.  Pain control I think is essential in this case.  The patient continues in physical therapy.  The possibility on her next visit of discontinuing her Zoloft  and replacing with Cymbalta might be the best intervention given that she has chronic pain.  The patient will return to see me in 7 weeks.  Collaboration of Care:   Patient/Guardian was advised Release of Information must be obtained prior to any record release in order to collaborate their care with an outside provider. Patient/Guardian was advised if they have not already done so to contact the registration department to sign all necessary forms in order for us  to release information regarding their care.   Consent: Patient/Guardian gives verbal consent for treatment and assignment of benefits for services provided during this visit. Patient/Guardian expressed understanding and agreed to proceed.   Elna LILLETTE Lo, MD 8/13/20252:43 PM

## 2023-09-01 ENCOUNTER — Encounter (HOSPITAL_COMMUNITY): Payer: Self-pay

## 2023-09-04 ENCOUNTER — Other Ambulatory Visit (HOSPITAL_COMMUNITY): Payer: Self-pay | Admitting: *Deleted

## 2023-09-04 DIAGNOSIS — I110 Hypertensive heart disease with heart failure: Secondary | ICD-10-CM | POA: Diagnosis not present

## 2023-09-04 DIAGNOSIS — I5032 Chronic diastolic (congestive) heart failure: Secondary | ICD-10-CM | POA: Diagnosis not present

## 2023-09-04 DIAGNOSIS — F411 Generalized anxiety disorder: Secondary | ICD-10-CM | POA: Diagnosis not present

## 2023-09-04 DIAGNOSIS — E785 Hyperlipidemia, unspecified: Secondary | ICD-10-CM | POA: Diagnosis not present

## 2023-09-04 DIAGNOSIS — K449 Diaphragmatic hernia without obstruction or gangrene: Secondary | ICD-10-CM | POA: Diagnosis not present

## 2023-09-04 DIAGNOSIS — M80052D Age-related osteoporosis with current pathological fracture, left femur, subsequent encounter for fracture with routine healing: Secondary | ICD-10-CM | POA: Diagnosis not present

## 2023-09-04 DIAGNOSIS — G500 Trigeminal neuralgia: Secondary | ICD-10-CM | POA: Diagnosis not present

## 2023-09-04 DIAGNOSIS — F32A Depression, unspecified: Secondary | ICD-10-CM | POA: Diagnosis not present

## 2023-09-04 DIAGNOSIS — K219 Gastro-esophageal reflux disease without esophagitis: Secondary | ICD-10-CM | POA: Diagnosis not present

## 2023-09-04 DIAGNOSIS — F4323 Adjustment disorder with mixed anxiety and depressed mood: Secondary | ICD-10-CM

## 2023-09-04 DIAGNOSIS — G629 Polyneuropathy, unspecified: Secondary | ICD-10-CM | POA: Diagnosis not present

## 2023-09-04 DIAGNOSIS — I2510 Atherosclerotic heart disease of native coronary artery without angina pectoris: Secondary | ICD-10-CM | POA: Diagnosis not present

## 2023-09-04 DIAGNOSIS — G8929 Other chronic pain: Secondary | ICD-10-CM | POA: Diagnosis not present

## 2023-09-04 MED ORDER — BUPROPION HCL ER (XL) 150 MG PO TB24: ORAL_TABLET | ORAL | 4 refills | Status: DC

## 2023-09-05 DIAGNOSIS — I5032 Chronic diastolic (congestive) heart failure: Secondary | ICD-10-CM | POA: Diagnosis not present

## 2023-09-05 DIAGNOSIS — M80052D Age-related osteoporosis with current pathological fracture, left femur, subsequent encounter for fracture with routine healing: Secondary | ICD-10-CM | POA: Diagnosis not present

## 2023-09-05 DIAGNOSIS — G500 Trigeminal neuralgia: Secondary | ICD-10-CM | POA: Diagnosis not present

## 2023-09-05 DIAGNOSIS — K219 Gastro-esophageal reflux disease without esophagitis: Secondary | ICD-10-CM | POA: Diagnosis not present

## 2023-09-05 DIAGNOSIS — I110 Hypertensive heart disease with heart failure: Secondary | ICD-10-CM | POA: Diagnosis not present

## 2023-09-05 DIAGNOSIS — F32A Depression, unspecified: Secondary | ICD-10-CM | POA: Diagnosis not present

## 2023-09-12 DIAGNOSIS — F32A Depression, unspecified: Secondary | ICD-10-CM | POA: Diagnosis not present

## 2023-09-12 DIAGNOSIS — K219 Gastro-esophageal reflux disease without esophagitis: Secondary | ICD-10-CM | POA: Diagnosis not present

## 2023-09-12 DIAGNOSIS — G500 Trigeminal neuralgia: Secondary | ICD-10-CM | POA: Diagnosis not present

## 2023-09-12 DIAGNOSIS — I5032 Chronic diastolic (congestive) heart failure: Secondary | ICD-10-CM | POA: Diagnosis not present

## 2023-09-12 DIAGNOSIS — M80052D Age-related osteoporosis with current pathological fracture, left femur, subsequent encounter for fracture with routine healing: Secondary | ICD-10-CM | POA: Diagnosis not present

## 2023-09-12 DIAGNOSIS — I110 Hypertensive heart disease with heart failure: Secondary | ICD-10-CM | POA: Diagnosis not present

## 2023-09-19 DIAGNOSIS — I5032 Chronic diastolic (congestive) heart failure: Secondary | ICD-10-CM | POA: Diagnosis not present

## 2023-09-19 DIAGNOSIS — I110 Hypertensive heart disease with heart failure: Secondary | ICD-10-CM | POA: Diagnosis not present

## 2023-09-19 DIAGNOSIS — G500 Trigeminal neuralgia: Secondary | ICD-10-CM | POA: Diagnosis not present

## 2023-09-19 DIAGNOSIS — M80052D Age-related osteoporosis with current pathological fracture, left femur, subsequent encounter for fracture with routine healing: Secondary | ICD-10-CM | POA: Diagnosis not present

## 2023-09-19 DIAGNOSIS — F32A Depression, unspecified: Secondary | ICD-10-CM | POA: Diagnosis not present

## 2023-09-19 DIAGNOSIS — K219 Gastro-esophageal reflux disease without esophagitis: Secondary | ICD-10-CM | POA: Diagnosis not present

## 2023-09-25 ENCOUNTER — Other Ambulatory Visit: Payer: Self-pay | Admitting: *Deleted

## 2023-09-25 MED ORDER — ESTRADIOL 0.1 MG/GM VA CREA: TOPICAL_CREAM | VAGINAL | 0 refills | Status: AC

## 2023-09-25 NOTE — Telephone Encounter (Signed)
 Last filled on 03/10/23 # 42.5 g/ 2 refills   Yearly Exam was on 07/07/23  [facility] [address]

## 2023-09-28 DIAGNOSIS — F32A Depression, unspecified: Secondary | ICD-10-CM | POA: Diagnosis not present

## 2023-09-28 DIAGNOSIS — I5032 Chronic diastolic (congestive) heart failure: Secondary | ICD-10-CM | POA: Diagnosis not present

## 2023-09-28 DIAGNOSIS — I110 Hypertensive heart disease with heart failure: Secondary | ICD-10-CM | POA: Diagnosis not present

## 2023-09-28 DIAGNOSIS — G500 Trigeminal neuralgia: Secondary | ICD-10-CM | POA: Diagnosis not present

## 2023-09-28 DIAGNOSIS — K219 Gastro-esophageal reflux disease without esophagitis: Secondary | ICD-10-CM | POA: Diagnosis not present

## 2023-09-28 DIAGNOSIS — M80052D Age-related osteoporosis with current pathological fracture, left femur, subsequent encounter for fracture with routine healing: Secondary | ICD-10-CM | POA: Diagnosis not present

## 2023-10-02 DIAGNOSIS — Z96652 Presence of left artificial knee joint: Secondary | ICD-10-CM | POA: Diagnosis not present

## 2023-10-17 ENCOUNTER — Other Ambulatory Visit (HOSPITAL_COMMUNITY): Payer: Self-pay | Admitting: *Deleted

## 2023-10-17 ENCOUNTER — Telehealth (HOSPITAL_COMMUNITY): Payer: Self-pay | Admitting: *Deleted

## 2023-10-17 ENCOUNTER — Encounter: Payer: Self-pay | Admitting: Family

## 2023-10-17 ENCOUNTER — Ambulatory Visit (INDEPENDENT_AMBULATORY_CARE_PROVIDER_SITE_OTHER): Admitting: Family

## 2023-10-17 VITALS — BP 122/74 | HR 83 | Temp 98.7°F | Wt 183.6 lb

## 2023-10-17 DIAGNOSIS — R309 Painful micturition, unspecified: Secondary | ICD-10-CM | POA: Diagnosis not present

## 2023-10-17 DIAGNOSIS — R82998 Other abnormal findings in urine: Secondary | ICD-10-CM | POA: Diagnosis not present

## 2023-10-17 DIAGNOSIS — F4323 Adjustment disorder with mixed anxiety and depressed mood: Secondary | ICD-10-CM

## 2023-10-17 DIAGNOSIS — N3000 Acute cystitis without hematuria: Secondary | ICD-10-CM | POA: Diagnosis not present

## 2023-10-17 LAB — POCT URINE DIPSTICK
Blood, UA: NEGATIVE
Glucose, UA: 100 mg/dL — AB
Ketones, POC UA: NEGATIVE mg/dL
Nitrite, UA: POSITIVE — AB
POC PROTEIN,UA: NEGATIVE
Spec Grav, UA: 1.01 (ref 1.010–1.025)
Urobilinogen, UA: 0.2 U/dL
pH, UA: 7.5 (ref 5.0–8.0)

## 2023-10-17 MED ORDER — BUPROPION HCL ER (XL) 300 MG PO TB24: ORAL_TABLET | ORAL | 0 refills | Status: DC

## 2023-10-17 MED ORDER — AMOXICILLIN-POT CLAVULANATE 875-125 MG PO TABS: 1.0000 | ORAL_TABLET | Freq: Two times a day (BID) | ORAL | 0 refills | Status: AC

## 2023-10-17 NOTE — Progress Notes (Signed)
 2  Established Patient Office Visit  Subjective:      CC:  Chief Complaint  Patient presents with   Acute Visit    UTI sxs and right ear pain     HPI: [name] [name] is a 89 y.o. female presenting on 10/17/2023 for Acute Visit (UTI sxs and right ear pain ) .  Discussed the use of AI scribe software for clinical note transcription with the patient, who gave verbal consent to proceed.  History of Present Illness [name] [name] is an 89 year old female who presents with right ear popping and cracking. She is accompanied by her aide.  She has been experiencing popping and cracking in her right ear for the past couple of weeks, particularly when yawning, along with a sensation of fullness. Her hearing aids were recently checked, and earwax was ruled out as a cause. No sharp pain similar to her trigeminal neuralgia is present.  She has been experiencing urinary symptoms since last Saturday, including dysuria and increased frequency, with an episode of urinating approximately fifty times in one day. She has been taking Azo since Saturday, which has altered her urine color to orange. No back pain, fever, or lower abdominal pain is reported.  Her Wellbutrin  dosage was increased to 450 mg about a month and a half ago. Since then, she has experienced dizziness and off-balance sensations. She also reports a hallucination of seeing a tree fall on a neighbor's house last week. She has not yet informed her psychiatrist about these symptoms.         Social history:  Relevant past medical, surgical, family and social history reviewed and updated as indicated. Interim medical history since our last visit reviewed.  Allergies and medications reviewed and updated.  DATA REVIEWED: CHART IN EPIC     ROS: Negative unless specifically indicated above in HPI.    Current Outpatient Medications:    acetaminophen  (TYLENOL ) 500 MG tablet, Take 2 tablets (1,000 mg total) by mouth every 8  (eight) hours., Disp: , Rfl:    alum & mag hydroxide-simeth (MAALOX/MYLANTA) 200-200-20 MG/5ML suspension, Take 30 mLs by mouth every 4 (four) hours as needed for indigestion., Disp: , Rfl:    amoxicillin -clavulanate (AUGMENTIN ) 875-125 MG tablet, Take 1 tablet by mouth 2 (two) times daily., Disp: 20 tablet, Rfl: 0   atorvastatin  (LIPITOR) 10 MG tablet, TAKE 1 TABLET DAILY, Disp: 90 tablet, Rfl: 2   buPROPion  (WELLBUTRIN  XL) 150 MG 24 hr tablet, Take 3 tablets (total dose 450 mg) by mouth qam., Disp: 90 tablet, Rfl: 4   calcium  carbonate (TUMS - DOSED IN MG ELEMENTAL CALCIUM ) 500 MG chewable tablet, Chew 1 tablet (200 mg of elemental calcium  total) by mouth 4 (four) times daily as needed for indigestion or heartburn., Disp: 10 tablet, Rfl: 0   clopidogrel  (PLAVIX ) 75 MG tablet, Take 1 tablet (75 mg total) by mouth daily., Disp: 90 tablet, Rfl: 3   diclofenac sodium (VOLTAREN) 1 % GEL, Apply 2-4 g topically at bedtime as needed (for pain- affected areas)., Disp: , Rfl:    estradiol  (ESTRACE ) 0.1 MG/GM vaginal cream, APPLY A PEA SIZED AMOUNT OF CREAM VAGINALLY TWICE A WEEK, Disp: 42.5 g, Rfl: 0   famotidine  (PEPCID ) 40 MG tablet, TAKE 1 TABLET DAILY, Disp: 90 tablet, Rfl: 3   furosemide  (LASIX ) 20 MG tablet, TAKE 1 TABLET DAILY, Disp: 90 tablet, Rfl: 1   gabapentin  (NEURONTIN ) 100 MG capsule, Take 2 capsules (200 mg total) by mouth at bedtime., Disp: ,  Rfl:    IMODIUM A-D 2 MG tablet, Take 2 mg by mouth 4 (four) times daily as needed for diarrhea or loose stools., Disp: , Rfl:    Liniments (ABSORBINE ARTHRITIS STRENGTH EX), Apply 1 application  topically daily as needed (Back pain)., Disp: , Rfl:    melatonin 3 MG TABS tablet, Take 1 tablet (3 mg total) by mouth at bedtime as needed., Disp: , Rfl:    methocarbamol  (ROBAXIN ) 500 MG tablet, Take 1 tablet (500 mg total) by mouth every 8 (eight) hours as needed for muscle spasms. Caution of sedation, Disp: 30 tablet, Rfl: 0   metoprolol  succinate  (TOPROL -XL) 25 MG 24 hr tablet, TAKE ONE-HALF (1/2) TABLET AT BEDTIME, Disp: 45 tablet, Rfl: 1   nitroGLYCERIN  (NITROSTAT ) 0.4 MG SL tablet, Place 1 tablet (0.4 mg total) under the tongue every 5 (five) minutes as needed for chest pain., Disp: 25 tablet, Rfl: 3   pantoprazole  (PROTONIX ) 40 MG tablet, Take 1 tablet (40 mg total) by mouth 2 (two) times daily., Disp: 180 tablet, Rfl: 1   Polyethyl Glycol-Propyl Glycol (SYSTANE OP), Place 1-2 drops into both eyes 3 (three) times daily as needed (for dryness)., Disp: , Rfl:    polyethylene glycol (MIRALAX  / GLYCOLAX ) 17 g packet, Take 17 g by mouth daily as needed., Disp: , Rfl:    predniSONE  (DELTASONE ) 10 MG tablet, Take 2 tablets (20 mg total) by mouth daily with breakfast., Disp: 60 tablet, Rfl: 3   senna (SENOKOT) 8.6 MG TABS tablet, Take 2 tablets (17.2 mg total) by mouth at bedtime., Disp: , Rfl:    sertraline  (ZOLOFT ) 100 MG tablet, Take 2 tablets (200 mg total) by mouth daily., Disp: 180 tablet, Rfl: 1   traMADol  (ULTRAM ) 50 MG tablet, Take 1 tablet (50 mg total) by mouth every 12 (twelve) hours as needed for severe pain (pain score 7-10). Caution of sedation, Disp: 15 tablet, Rfl: 0   APPLE CIDER VINEGAR PO, Take 2 tablets by mouth See admin instructions. Chew 2 gummies by mouth at bedtime (Patient not taking: Reported on 10/17/2023), Disp: , Rfl:   Current Facility-Administered Medications:    denosumab  (PROLIA ) injection 60 mg, 60 mg, Subcutaneous, Once, Tower, Marne A, MD        Objective:        BP 122/74 (BP Location: Left Arm, Patient Position: Sitting, Cuff Size: Normal)   Pulse 83   Temp 98.7 F (37.1 C) (Temporal)   Wt 183 lb 9.6 oz (83.3 kg)   SpO2 98%   BMI 31.51 kg/m   Physical Exam HEENT: Fluid behind right eardrum, no infection in right ear.  Wt Readings from Last 3 Encounters:  10/17/23 183 lb 9.6 oz (83.3 kg)  08/10/23 160 lb (72.6 kg)  07/07/23 180 lb 8 oz (81.9 kg)    Physical Exam Constitutional:       General: She is not in acute distress.    Appearance: Normal appearance. She is normal weight. She is not ill-appearing, toxic-appearing or diaphoretic.  HENT:     Right Ear: A middle ear effusion (clear) is present.  Cardiovascular:     Rate and Rhythm: Normal rate.  Pulmonary:     Effort: Pulmonary effort is normal.  Abdominal:     General: Abdomen is flat.     Tenderness: There is no abdominal tenderness. There is no right CVA tenderness or left CVA tenderness.  Neurological:     General: No focal deficit present.     Mental  Status: She is alert and oriented to person, place, and time. Mental status is at baseline.     Cranial Nerves: No cranial nerve deficit or facial asymmetry.     Motor: No weakness.  Psychiatric:        Mood and Affect: Mood normal.        Behavior: Behavior normal.        Thought Content: Thought content normal.        Judgment: Judgment normal.          Results   Assessment & Plan:   Assessment and Plan Assessment & Plan Right ear popping and cracking Intermittent popping and cracking in the right ear for the past couple of weeks. Fluid behind the eardrum likely contributes to symptoms. No signs of infection. Possible relation to allergy symptoms. - Prescribe Augmentin  to address potential ear infection and UTI.  Major depressive disorder Current medication regimen includes Wellbutrin , recently increased to 450 mg, and transitioning from Zoloft  to Cymbalta. Reports of dizziness since the increase in Wellbutrin  dosage, likely side effects. - Advise her to contact psychiatrist to discuss hallucinations and dizziness since Wellbutrin  dosage increase. However hallucinations also to be considered as a symptom of UTI  Recording duration: 12 minutes      Return for f/u PCP if no improvement in symptoms.     Ginger Patrick, MSN, APRN, FNP-C [facility]

## 2023-10-17 NOTE — Telephone Encounter (Signed)
 Pt of Dr. Tasia who's daughter called with concerns that pt just told her that she's been having VH since Wellbutrin  was increased to 450 mg every day, started on 09/04/23. Pt was seen this morning for probable UTI but final results not available yet in EPIC. PCP advised them to contact us  in regards to the dizziness and hallucinations she's been experiencing since increase Wellbutrin . Please review and advise.

## 2023-10-17 NOTE — Telephone Encounter (Signed)
 If Wellbutrin  causing the visual hallucinations then patient need to cut down the dose to maximum 300.  Please inform Dr.Plovsky.

## 2023-10-18 ENCOUNTER — Ambulatory Visit (HOSPITAL_COMMUNITY): Admitting: Psychiatry

## 2023-10-18 LAB — URINALYSIS W MICROSCOPIC + REFLEX CULTURE
Bilirubin Urine: NEGATIVE
Glucose, UA: NEGATIVE
Hgb urine dipstick: NEGATIVE
Hyaline Cast: NONE SEEN /LPF
Ketones, ur: NEGATIVE
Leukocyte Esterase: NEGATIVE
Nitrites, Initial: POSITIVE — AB
Protein, ur: NEGATIVE
RBC / HPF: NONE SEEN /HPF (ref 0–2)
Specific Gravity, Urine: 1.009 (ref 1.001–1.035)
Squamous Epithelial / HPF: NONE SEEN /HPF (ref <=5)
WBC, UA: NONE SEEN /HPF (ref 0–5)
pH: 7.5 (ref 5.0–8.0)

## 2023-10-18 LAB — NO CULTURE INDICATED

## 2023-10-20 ENCOUNTER — Ambulatory Visit (HOSPITAL_COMMUNITY): Admitting: Psychiatry

## 2023-10-24 ENCOUNTER — Ambulatory Visit: Payer: Self-pay | Admitting: Family

## 2023-10-25 ENCOUNTER — Other Ambulatory Visit: Payer: Self-pay

## 2023-10-25 ENCOUNTER — Ambulatory Visit (HOSPITAL_COMMUNITY): Admitting: Psychiatry

## 2023-10-25 ENCOUNTER — Encounter (HOSPITAL_COMMUNITY): Payer: Self-pay | Admitting: Psychiatry

## 2023-10-25 VITALS — BP 103/63 | HR 67 | Ht 65.0 in | Wt 183.0 lb

## 2023-10-25 DIAGNOSIS — F329 Major depressive disorder, single episode, unspecified: Secondary | ICD-10-CM

## 2023-10-25 NOTE — Progress Notes (Signed)
 Psychiatric Initial Adult Assessment   Patient Identification: [name] [name] MRN:  [id number] Date of Evaluation:  10/25/2023 Referral Source: Dr Laine Balls Chief Complaint: Depression    Today the patient is seen with her sister apparently there was some problem with the higher dose Wellbutrin  and was reduced back to 300 mg.  There was a question about hallucinations.  Overall patient still feels moderately depressed.  But her biggest complaint is that of her knee pain.  The patient has persistent depression disorder.  Fortunately now she is off the trazodone but she still takes prednisone  30 mg a day.  Patient is able to read watch TV and functions fairly well.  Her biggest complaint however is her pain in her knee which limits his function. Visit Diagnosis:  No diagnosis found.   History of Present Illness:    Associated Signs/Symptoms: Depression Symptoms:  depressed mood, (Hypo) Manic Symptoms:   Anxiety Symptoms:   Psychotic Symptoms:   PTSD Symptoms: NA  Past Psychiatric History:   Previous Psychotropic Medications: Yes   Substance Abuse History in te last 12 months:  No.  Consequences of Substance Abuse: NA  Past Medical History:  Past Medical History:  Diagnosis Date   Alternating constipation and diarrhea    Anxiety state, unspecified    CHF (congestive heart failure) (HCC)    Complication of anesthesia    slow to wake up with surgery several years ago    COVID-19 virus infection 12/2018   Depressive disorder, not elsewhere classified    Diaphragmatic hernia without mention of obstruction or gangrene    Difficulty sleeping    Diverticulosis of colon (without mention of hemorrhage)    Esophageal reflux    External hemorrhoids without mention of complication    Family history of adverse reaction to anesthesia    Dad very slow to wake upi   Fatigue    Frequency of urination    Gallstones    Headache    Helicobacter pylori (H. pylori)    Hiatal hernia     History of nuclear stress test    Myoview  05/2021: EF 70, normal perfusion; low risk   History of transfusion    Lumbar spondylosis    Macular degeneration    Macular degeneration (senile) of retina, unspecified    Osteoarthrosis, unspecified whether generalized or localized, unspecified site 08/11/2011   hx. rhematoid arthritis, osteoarthritis, DDD, bursitis(hip)   Osteoporosis    Palpitations    PONV (postoperative nausea and vomiting)    Schatzki's ring    Shortness of breath 08/11/2011   with exertion only at present   Unspecified cerebral artery occlusion with cerebral infarction 08/11/2011   '97/ '06( TIA)-affected lt. side, slight weakness L side   Unspecified vitamin D  deficiency     Past Surgical History:  Procedure Laterality Date   ABDOMINAL HYSTERECTOMY  08-11-11   APPENDECTOMY     BACK SURGERY  08-11-11   '11-hx. lumbar fusion with retained hardware   CARPAL TUNNEL RELEASE Right 05/02/2022   Procedure: RIGHT CARPAL TUNNEL RELEASE;  Surgeon: Murrell Drivers, MD;  Location: [facility];  Service: Orthopedics;  Laterality: Right;  Bier block   CATARACT EXTRACTION  08-11-11   Bilateral   ESOPHAGEAL DILATION     ESOPHAGOGASTRODUODENOSCOPY (EGD) WITH PROPOFOL  N/A 12/06/2021   Procedure: ESOPHAGOGASTRODUODENOSCOPY (EGD) WITH PROPOFOL ;  Surgeon: Stacia Glendia BRAVO, MD;  Location: [facility];  Service: Gastroenterology;  Laterality: N/A;   INTRAMEDULLARY (IM) NAIL INTERTROCHANTERIC Left 05/09/2023   Procedure:  FIXATION, FRACTURE, INTERTROCHANTERIC, WITH INTRAMEDULLARY ROD;  Surgeon: Ernie Cough, MD;  Location: [facility];  Service: Orthopedics;  Laterality: Left;   KNEE SURGERY  08-11-11   rt. knee scope   PATELLAR TENDON REPAIR Left 02/22/2014   Procedure: PATELLA TENDON REPAIR;  Surgeon: Dempsey Melodi GAILS, MD;  Location: [facility];  Service: Orthopedics;  Laterality: Left;   PATELLAR TENDON REPAIR Left 06/25/2014   Procedure: LEFT PATELLA TENDON REPAIR;  Surgeon: Dempsey Melodi, MD;  Location: [facility];  Service: Orthopedics;  Laterality: Left;   SAVORY DILATION N/A 12/06/2021   Procedure: SAVORY DILATION;  Surgeon: Stacia Glendia BRAVO, MD;  Location: [facility];  Service: Gastroenterology;  Laterality: N/A;   TOTAL KNEE ARTHROPLASTY  08/16/2011   Procedure: TOTAL KNEE ARTHROPLASTY;  Surgeon: Dempsey GAILS Melodi, MD;  Location: [facility];  Service: Orthopedics;  Laterality: Right;   TOTAL KNEE ARTHROPLASTY Left 01/27/2014   Procedure: LEFT TOTAL KNEE ARTHROPLASTY;  Surgeon: Dempsey Melodi GAILS, MD;  Location: [facility];  Service: Orthopedics;  Laterality: Left;   TRIGGER FINGER RELEASE Right 05/02/2022   Procedure: RELEASE TRIGGER FINGER/A-1 PULLEY RIGHT LONG FINGER;  Surgeon: Murrell Drivers, MD;  Location: [facility];  Service: Orthopedics;  Laterality: Right;  Bier block   TUBAL LIGATION      Family Psychiatric History:   Family History:  Family History  Problem Relation Age of Onset   Heart disease Father    Coronary artery disease Brother    Diabetes Brother    Heart disease Brother    Pancreatic cancer Daughter    Diabetes Sister    Colon cancer Neg Hx    Colon polyps Neg Hx    Esophageal cancer Neg Hx    Gallbladder disease Neg Hx     Social History:   Social History   Socioeconomic History   Marital status: Widowed    Spouse name: Not on file   Number of children: 3   Years of education: Not on file   Highest education level: Not on file  Occupational History   Occupation: disabled    Employer: RETIRED  Tobacco Use   Smoking status: Never   Smokeless tobacco: Never  Vaping Use   Vaping status: Never Used  Substance and Sexual Activity   Alcohol use: No    Alcohol/week: 0.0 standard drinks of alcohol   Drug use: No   Sexual activity: Not Currently  Other Topics Concern   Not on file  Social History Narrative   Not on file   Social Drivers of Health   Financial Resource Strain: Low Risk  (08/10/2023)   Overall Financial  Resource Strain (CARDIA)    Difficulty of Paying Living Expenses: Not hard at all  Food Insecurity: No Food Insecurity (08/10/2023)   Hunger Vital Sign    Worried About Running Out of Food in the Last Year: Never true    Ran Out of Food in the Last Year: Never true  Transportation Needs: No Transportation Needs (08/10/2023)   PRAPARE - Administrator, Civil Service (Medical): No    Lack of Transportation (Non-Medical): No  Physical Activity: Inactive (05/17/2022)   Exercise Vital Sign    Days of Exercise per Week: 0 days    Minutes of Exercise per Session: 0 min  Stress: No Stress Concern Present (08/10/2023)   [employer] of Occupational Health - Occupational Stress Questionnaire    Feeling of Stress: Not at all  Social Connections: Patient Declined (08/10/2023)  Social Advertising account executive    Frequency of Communication with Friends and Family: Patient declined    Frequency of Social Gatherings with Friends and Family: Patient declined    Attends Religious Services: Patient declined    Database administrator or Organizations: Patient declined    Attends Banker Meetings: Patient declined    Marital Status: Patient declined    Additional Social History:   Allergies:   Allergies  Allergen Reactions   Alendronate Sodium Other (See Comments)    JAW PAIN   Celecoxib Other (See Comments)    GI UPSET   Chocolate Diarrhea   Omeprazole Nausea Only   Rabeprazole Nausea And Vomiting   Latex Itching, Dermatitis and Rash   Sulfa  Antibiotics Rash and Dermatitis    Metabolic Disorder Labs: Lab Results  Component Value Date   HGBA1C 5.7 07/07/2023   MPG 133 04/25/2007   No results found for: PROLACTIN Lab Results  Component Value Date   CHOL 173 07/07/2023   TRIG 102.0 07/07/2023   HDL 71.40 07/07/2023   CHOLHDL 2 07/07/2023   VLDL 20.4 07/07/2023   LDLCALC 81 07/07/2023   LDLCALC 90 06/29/2022   Lab Results  Component Value Date    TSH 2.69 07/07/2023    Therapeutic Level Labs: No results found for: LITHIUM No results found for: CBMZ No results found for: VALPROATE  Current Medications: Current Outpatient Medications  Medication Sig Dispense Refill   acetaminophen  (TYLENOL ) 500 MG tablet Take 2 tablets (1,000 mg total) by mouth every 8 (eight) hours.     alum & mag hydroxide-simeth (MAALOX/MYLANTA) 200-200-20 MG/5ML suspension Take 30 mLs by mouth every 4 (four) hours as needed for indigestion.     amoxicillin -clavulanate (AUGMENTIN ) 875-125 MG tablet Take 1 tablet by mouth 2 (two) times daily. 20 tablet 0   APPLE CIDER VINEGAR PO Take 2 tablets by mouth See admin instructions. Chew 2 gummies by mouth at bedtime     atorvastatin  (LIPITOR) 10 MG tablet TAKE 1 TABLET DAILY 90 tablet 2   buPROPion  (WELLBUTRIN  XL) 300 MG 24 hr tablet Take 1 tablet (300 mg total) daily. 30 tablet 0   calcium  carbonate (TUMS - DOSED IN MG ELEMENTAL CALCIUM ) 500 MG chewable tablet Chew 1 tablet (200 mg of elemental calcium  total) by mouth 4 (four) times daily as needed for indigestion or heartburn. 10 tablet 0   clopidogrel  (PLAVIX ) 75 MG tablet Take 1 tablet (75 mg total) by mouth daily. 90 tablet 3   diclofenac sodium (VOLTAREN) 1 % GEL Apply 2-4 g topically at bedtime as needed (for pain- affected areas).     estradiol  (ESTRACE ) 0.1 MG/GM vaginal cream APPLY A PEA SIZED AMOUNT OF CREAM VAGINALLY TWICE A WEEK 42.5 g 0   famotidine  (PEPCID ) 40 MG tablet TAKE 1 TABLET DAILY 90 tablet 3   furosemide  (LASIX ) 20 MG tablet TAKE 1 TABLET DAILY 90 tablet 1   gabapentin  (NEURONTIN ) 100 MG capsule Take 2 capsules (200 mg total) by mouth at bedtime.     IMODIUM A-D 2 MG tablet Take 2 mg by mouth 4 (four) times daily as needed for diarrhea or loose stools.     Liniments (ABSORBINE ARTHRITIS STRENGTH EX) Apply 1 application  topically daily as needed (Back pain).     melatonin 3 MG TABS tablet Take 1 tablet (3 mg total) by mouth at bedtime as  needed.     methocarbamol  (ROBAXIN ) 500 MG tablet Take 1 tablet (500 mg total) by  mouth every 8 (eight) hours as needed for muscle spasms. Caution of sedation 30 tablet 0   metoprolol  succinate (TOPROL -XL) 25 MG 24 hr tablet TAKE ONE-HALF (1/2) TABLET AT BEDTIME 45 tablet 1   nitroGLYCERIN  (NITROSTAT ) 0.4 MG SL tablet Place 1 tablet (0.4 mg total) under the tongue every 5 (five) minutes as needed for chest pain. 25 tablet 3   pantoprazole  (PROTONIX ) 40 MG tablet Take 1 tablet (40 mg total) by mouth 2 (two) times daily. 180 tablet 1   Polyethyl Glycol-Propyl Glycol (SYSTANE OP) Place 1-2 drops into both eyes 3 (three) times daily as needed (for dryness).     polyethylene glycol (MIRALAX  / GLYCOLAX ) 17 g packet Take 17 g by mouth daily as needed.     predniSONE  (DELTASONE ) 10 MG tablet Take 2 tablets (20 mg total) by mouth daily with breakfast. 60 tablet 3   senna (SENOKOT) 8.6 MG TABS tablet Take 2 tablets (17.2 mg total) by mouth at bedtime.     sertraline  (ZOLOFT ) 100 MG tablet Take 2 tablets (200 mg total) by mouth daily. 180 tablet 1   traMADol  (ULTRAM ) 50 MG tablet Take 1 tablet (50 mg total) by mouth every 12 (twelve) hours as needed for severe pain (pain score 7-10). Caution of sedation 15 tablet 0   Current Facility-Administered Medications  Medication Dose Route Frequency Provider Last Rate Last Admin   denosumab  (PROLIA ) injection 60 mg  60 mg Subcutaneous Once Tower, Laine LABOR, MD        Musculoskeletal: Strength & Muscle Tone: decreased Gait & Station: unsteady Patient leans: Right  Psychiatric Specialty Exam: Review of Systems  Blood pressure 103/63, pulse 67, height 5' 5 (1.651 m), weight 183 lb (83 kg).Body mass index is 30.45 kg/m.  General Appearance: Casual  Eye Contact:  Good  Speech:  Clear and Coherent  Volume:  Normal  Mood:  Anxious  Affect:  Appropriate  Thought Process:  Coherent  Orientation:  Full (Time, Place, and Person)  Thought Content:  WDL  Suicidal  Thoughts:  No  Homicidal Thoughts:  No  Memory:  NA  Judgement:  Good  Insight:  Good  Psychomotor Activity:  Decreased  Concentration:    Recall:  Good  Fund of Knowledge:Good  Language: Good  Akathisia:  NA  Handed:  Right  AIMS (if indicated):  not done  Assets:  Desire for Improvement  ADL's:  Intact  Cognition: WNL  Sleep:  Good   Screenings: GAD-7    Flowsheet Row Office Visit from [facility]/07/2023 in [facility] Office Visit from 08/15/2022 in [facility] Office Visit from 06/24/2022 in [facility]  Total GAD-7 Score 21 21 19    Mini-Mental    Flowsheet Row Clinical Support from 02/09/2018 in [facility] Clinical Support from 02/08/2017 in [facility] Clinical Support from 12/14/2015 in [facility]  Total Score (max 30 points ) 20 20 20    PHQ2-9    Flowsheet Row Clinical Support from 08/10/2023 in [facility] Office Visit from 07/07/2023 in [facility] Office Visit from 08/15/2022 in [facility] Office Visit from 06/24/2022 in [facility] Clinical Support from 05/17/2022 in [facility] Total Score 2  6 0 5 4  PHQ-9 Total Score 2 19 12 16 10    Flowsheet Row ED to Hosp-Admission (Discharged) from 05/07/2023 in [facility] ED from 05/05/2022 in [facility] Department at [facility] Admission (Discharged) from 05/02/2022 in [facility]  C-SSRS RISK CATEGORY No Risk No Risk No Risk    Assessment and Plan:    This patient's diagnosis is persistent depression disorder.  Today we are going to switch her off of Zoloft  onto Cymbalta 60 mg.  She will continue taking 300 mg of  Wellbutrin .  The patient will be seen again in about 2-1/2 months.  Hopefully with less pain her mood will be better.  Collaboration of Care:   Patient/Guardian was advised Release of Information must be obtained prior to any record release in order to collaborate their care with an outside provider. Patient/Guardian was advised if they have not already done so to contact the registration department to sign all necessary forms in order for us  to release information regarding their care.   Consent: Patient/Guardian gives verbal consent for treatment and assignment of benefits for services provided during this visit. Patient/Guardian expressed understanding and agreed to proceed.   Elna LILLETTE Lo, MD 10/8/20252:40 PM

## 2023-10-26 ENCOUNTER — Other Ambulatory Visit (HOSPITAL_COMMUNITY): Payer: Self-pay | Admitting: *Deleted

## 2023-10-26 ENCOUNTER — Encounter (HOSPITAL_COMMUNITY): Payer: Self-pay

## 2023-10-26 DIAGNOSIS — Z96652 Presence of left artificial knee joint: Secondary | ICD-10-CM | POA: Diagnosis not present

## 2023-10-26 MED ORDER — DULOXETINE HCL 20 MG PO CPEP: 60.0000 mg | ORAL_CAPSULE | Freq: Every day | ORAL | 2 refills | Status: DC

## 2023-10-26 MED ORDER — DULOXETINE HCL 30 MG PO CPEP: 30.0000 mg | ORAL_CAPSULE | Freq: Every day | ORAL | 0 refills | Status: DC

## 2023-10-27 ENCOUNTER — Other Ambulatory Visit (HOSPITAL_COMMUNITY): Payer: Self-pay | Admitting: *Deleted

## 2023-10-27 MED ORDER — DULOXETINE HCL 60 MG PO CPEP
60.0000 mg | ORAL_CAPSULE | Freq: Every day | ORAL | 2 refills | Status: DC
  Filled 2023-12-11 – 2023-12-21 (×3): qty 30, 30d supply, fill #0

## 2023-11-10 DIAGNOSIS — H6991 Unspecified Eustachian tube disorder, right ear: Secondary | ICD-10-CM | POA: Diagnosis not present

## 2023-11-10 DIAGNOSIS — Z974 Presence of external hearing-aid: Secondary | ICD-10-CM | POA: Diagnosis not present

## 2023-11-10 DIAGNOSIS — H6123 Impacted cerumen, bilateral: Secondary | ICD-10-CM | POA: Diagnosis not present

## 2023-11-20 ENCOUNTER — Encounter: Payer: Self-pay | Admitting: Radiology

## 2023-11-22 ENCOUNTER — Other Ambulatory Visit (HOSPITAL_COMMUNITY): Payer: Self-pay | Admitting: Psychiatry

## 2023-11-22 DIAGNOSIS — F4323 Adjustment disorder with mixed anxiety and depressed mood: Secondary | ICD-10-CM

## 2023-11-24 ENCOUNTER — Other Ambulatory Visit (HOSPITAL_COMMUNITY): Payer: Self-pay | Admitting: *Deleted

## 2023-11-24 DIAGNOSIS — F4323 Adjustment disorder with mixed anxiety and depressed mood: Secondary | ICD-10-CM

## 2023-11-24 MED ORDER — BUPROPION HCL ER (XL) 300 MG PO TB24
ORAL_TABLET | ORAL | 1 refills | Status: AC
  Filled 2023-12-11 – 2023-12-21 (×3): qty 30, 30d supply, fill #0

## 2023-11-24 MED ORDER — BUPROPION HCL ER (XL) 300 MG PO TB24: ORAL_TABLET | ORAL | 0 refills | Status: DC

## 2023-11-27 DIAGNOSIS — M5416 Radiculopathy, lumbar region: Secondary | ICD-10-CM | POA: Diagnosis not present

## 2023-11-27 DIAGNOSIS — M961 Postlaminectomy syndrome, not elsewhere classified: Secondary | ICD-10-CM | POA: Diagnosis not present

## 2023-11-28 ENCOUNTER — Other Ambulatory Visit (HOSPITAL_COMMUNITY): Payer: Self-pay

## 2023-12-02 ENCOUNTER — Other Ambulatory Visit (HOSPITAL_COMMUNITY): Payer: Self-pay

## 2023-12-07 ENCOUNTER — Other Ambulatory Visit (HOSPITAL_COMMUNITY): Payer: Self-pay

## 2023-12-07 DIAGNOSIS — M65331 Trigger finger, right middle finger: Secondary | ICD-10-CM | POA: Diagnosis not present

## 2023-12-07 DIAGNOSIS — M19041 Primary osteoarthritis, right hand: Secondary | ICD-10-CM | POA: Diagnosis not present

## 2023-12-07 MED ORDER — METHYLPREDNISOLONE 4 MG PO TBPK
ORAL_TABLET | ORAL | 0 refills | Status: AC
  Filled 2023-12-07: qty 21, 6d supply, fill #0

## 2023-12-07 NOTE — Telephone Encounter (Signed)
 Patient called and wants to see Dr Murrell today  Patient stated that the middle finger on her right hand that she had surgery on is swollen and red and she can not move it without pain  Patient can be reached at [phone]

## 2023-12-11 ENCOUNTER — Other Ambulatory Visit (HOSPITAL_COMMUNITY): Payer: Self-pay

## 2023-12-11 ENCOUNTER — Other Ambulatory Visit: Payer: Self-pay

## 2023-12-11 MED ORDER — FUROSEMIDE 20 MG PO TABS
20.0000 mg | ORAL_TABLET | Freq: Every morning | ORAL | 0 refills | Status: AC
  Filled 2023-12-11: qty 30, 30d supply, fill #0

## 2023-12-11 MED ORDER — CLOPIDOGREL BISULFATE 75 MG PO TABS
75.0000 mg | ORAL_TABLET | Freq: Every day | ORAL | 3 refills | Status: AC
  Filled 2023-12-11: qty 90, 90d supply, fill #0

## 2023-12-11 MED ORDER — FLUOCINONIDE 0.05 % EX SOLN
CUTANEOUS | 2 refills | Status: DC
  Filled 2023-12-11: qty 60, 30d supply, fill #0

## 2023-12-11 MED ORDER — BUPROPION HCL ER (XL) 150 MG PO TB24
450.0000 mg | ORAL_TABLET | Freq: Every morning | ORAL | 4 refills | Status: AC
  Filled 2023-12-11: qty 90, 30d supply, fill #0

## 2023-12-12 ENCOUNTER — Other Ambulatory Visit (HOSPITAL_COMMUNITY): Payer: Self-pay

## 2023-12-12 ENCOUNTER — Other Ambulatory Visit: Payer: Self-pay

## 2023-12-13 ENCOUNTER — Other Ambulatory Visit: Payer: Self-pay

## 2023-12-20 DIAGNOSIS — Z96652 Presence of left artificial knee joint: Secondary | ICD-10-CM | POA: Diagnosis not present

## 2023-12-21 ENCOUNTER — Other Ambulatory Visit (HOSPITAL_COMMUNITY): Payer: Self-pay

## 2023-12-21 ENCOUNTER — Other Ambulatory Visit: Payer: Self-pay

## 2023-12-22 ENCOUNTER — Encounter: Payer: Self-pay | Admitting: Pharmacist

## 2023-12-22 ENCOUNTER — Other Ambulatory Visit: Payer: Self-pay

## 2023-12-27 DIAGNOSIS — M66241 Spontaneous rupture of extensor tendons, right hand: Secondary | ICD-10-CM | POA: Diagnosis not present

## 2023-12-29 ENCOUNTER — Other Ambulatory Visit: Payer: Self-pay | Admitting: Family Medicine

## 2023-12-29 NOTE — Telephone Encounter (Signed)
 Gabapentin  on med list as a historical entry but last filled by PCP was on 03/28/23 #540/ 1 refill Metoprolol  also due  CPE was on 07/07/23

## 2024-01-03 ENCOUNTER — Other Ambulatory Visit: Payer: Self-pay

## 2024-01-03 ENCOUNTER — Ambulatory Visit (HOSPITAL_COMMUNITY): Admitting: Psychiatry

## 2024-01-03 ENCOUNTER — Other Ambulatory Visit: Payer: Self-pay | Admitting: Family Medicine

## 2024-01-03 VITALS — BP 113/65 | HR 80 | Ht 64.0 in | Wt 181.0 lb

## 2024-01-03 DIAGNOSIS — F320 Major depressive disorder, single episode, mild: Secondary | ICD-10-CM

## 2024-01-03 MED ORDER — DULOXETINE HCL 60 MG PO CPEP: ORAL_CAPSULE | ORAL | 5 refills | Status: DC

## 2024-01-03 NOTE — Progress Notes (Signed)
 Psychiatric Initial Adult Assessment   Patient Identification: [name] [name] MRN:  [id number] Date of Evaluation:  01/03/2024 Referral Source: Dr Laine Balls Chief Complaint: Depression     Today the patient is seen with her aide Shona.  The patient is not doing all that well.  She feels moderate depression and feels a lot of anxiety.  She is very physically limited.  She is having a lot of pain in her left leg and her knee.  She is having an x-ray coming up soon.  She sees Dr. Yasmin but she does not have a return appointment.  She takes some hydrocodone  on on a rare basis.  Her sleep is poor.  She falls asleep but she does not stay asleep.  Her appetite is reasonably good.  She is in a great deal of pain.  She has a conflictual relationship with her daughter.  She lives in her son's home.  Last Easter she had fallen and injured herself. Visit Diagnosis:  No diagnosis found.   History of Present Illness:    Associated Signs/Symptoms: Depression Symptoms:  depressed mood, (Hypo) Manic Symptoms:   Anxiety Symptoms:   Psychotic Symptoms:   PTSD Symptoms: NA  Past Psychiatric History:   Previous Psychotropic Medications: Yes   Substance Abuse History in te last 12 months:  No.  Consequences of Substance Abuse: NA  Past Medical History:  Past Medical History:  Diagnosis Date   Alternating constipation and diarrhea    Anxiety state, unspecified    CHF (congestive heart failure) (HCC)    Complication of anesthesia    slow to wake up with surgery several years ago    COVID-19 virus infection 12/2018   Depressive disorder, not elsewhere classified    Diaphragmatic hernia without mention of obstruction or gangrene    Difficulty sleeping    Diverticulosis of colon (without mention of hemorrhage)    Esophageal reflux    External hemorrhoids without mention of complication    Family history of adverse reaction to anesthesia    Dad very slow to wake upi   Fatigue     Frequency of urination    Gallstones    Headache    Helicobacter pylori (H. pylori)    Hiatal hernia    History of nuclear stress test    Myoview  05/2021: EF 70, normal perfusion; low risk   History of transfusion    Lumbar spondylosis    Macular degeneration    Macular degeneration (senile) of retina, unspecified    Osteoarthrosis, unspecified whether generalized or localized, unspecified site 08/11/2011   hx. rhematoid arthritis, osteoarthritis, DDD, bursitis(hip)   Osteoporosis    Palpitations    PONV (postoperative nausea and vomiting)    Schatzki's ring    Shortness of breath 08/11/2011   with exertion only at present   Unspecified cerebral artery occlusion with cerebral infarction 08/11/2011   '97/ '06( TIA)-affected lt. side, slight weakness L side   Unspecified vitamin D  deficiency     Past Surgical History:  Procedure Laterality Date   ABDOMINAL HYSTERECTOMY  08-11-11   APPENDECTOMY     BACK SURGERY  08-11-11   '11-hx. lumbar fusion with retained hardware   CARPAL TUNNEL RELEASE Right 05/02/2022   Procedure: RIGHT CARPAL TUNNEL RELEASE;  Surgeon: Murrell Drivers, MD;  Location: [facility];  Service: Orthopedics;  Laterality: Right;  Bier block   CATARACT EXTRACTION  08-11-11   Bilateral   ESOPHAGEAL DILATION     ESOPHAGOGASTRODUODENOSCOPY (EGD) WITH  PROPOFOL  N/A 12/06/2021   Procedure: ESOPHAGOGASTRODUODENOSCOPY (EGD) WITH PROPOFOL ;  Surgeon: Stacia Glendia BRAVO, MD;  Location: [facility];  Service: Gastroenterology;  Laterality: N/A;   INTRAMEDULLARY (IM) NAIL INTERTROCHANTERIC Left 05/09/2023   Procedure: FIXATION, FRACTURE, INTERTROCHANTERIC, WITH INTRAMEDULLARY ROD;  Surgeon: Ernie Cough, MD;  Location: [facility];  Service: Orthopedics;  Laterality: Left;   KNEE SURGERY  08-11-11   rt. knee scope   PATELLAR TENDON REPAIR Left 02/22/2014   Procedure: PATELLA TENDON REPAIR;  Surgeon: Dempsey Melodi GAILS, MD;  Location: [facility];  Service: Orthopedics;  Laterality:  Left;   PATELLAR TENDON REPAIR Left 06/25/2014   Procedure: LEFT PATELLA TENDON REPAIR;  Surgeon: Dempsey Melodi, MD;  Location: [facility];  Service: Orthopedics;  Laterality: Left;   SAVORY DILATION N/A 12/06/2021   Procedure: SAVORY DILATION;  Surgeon: Stacia Glendia BRAVO, MD;  Location: [facility];  Service: Gastroenterology;  Laterality: N/A;   TOTAL KNEE ARTHROPLASTY  08/16/2011   Procedure: TOTAL KNEE ARTHROPLASTY;  Surgeon: Dempsey GAILS Melodi, MD;  Location: [facility];  Service: Orthopedics;  Laterality: Right;   TOTAL KNEE ARTHROPLASTY Left 01/27/2014   Procedure: LEFT TOTAL KNEE ARTHROPLASTY;  Surgeon: Dempsey Melodi GAILS, MD;  Location: [facility];  Service: Orthopedics;  Laterality: Left;   TRIGGER FINGER RELEASE Right 05/02/2022   Procedure: RELEASE TRIGGER FINGER/A-1 PULLEY RIGHT LONG FINGER;  Surgeon: Murrell Drivers, MD;  Location: [facility];  Service: Orthopedics;  Laterality: Right;  Bier block   TUBAL LIGATION      Family Psychiatric History:   Family History:  Family History  Problem Relation Age of Onset   Heart disease Father    Coronary artery disease Brother    Diabetes Brother    Heart disease Brother    Pancreatic cancer Daughter    Diabetes Sister    Colon cancer Neg Hx    Colon polyps Neg Hx    Esophageal cancer Neg Hx    Gallbladder disease Neg Hx     Social History:   Social History   Socioeconomic History   Marital status: Widowed    Spouse name: Not on file   Number of children: 3   Years of education: Not on file   Highest education level: Not on file  Occupational History   Occupation: disabled    Employer: RETIRED  Tobacco Use   Smoking status: Never   Smokeless tobacco: Never  Vaping Use   Vaping status: Never Used  Substance and Sexual Activity   Alcohol use: No    Alcohol/week: 0.0 standard drinks of alcohol   Drug use: No   Sexual activity: Not Currently  Other Topics Concern   Not on file  Social History Narrative   Not on file    Social Drivers of Health   Tobacco Use: Low Risk (12/07/2023)   Received from [facility]   Patient History    Smoking Tobacco Use: Never    Smokeless Tobacco Use: Never    Passive Exposure: Not on file  Financial Resource Strain: Low Risk (08/10/2023)   Overall Financial Resource Strain (CARDIA)    Difficulty of Paying Living Expenses: Not hard at all  Food Insecurity: Low Risk (11/10/2023)   Received from [facility]   Epic    Within the past 12 months, you worried that your food would run out before you got money to buy more: Never true    Within the past 12 months, the food you bought just didn't last and you didn't have  money to get more. : Never true  Transportation Needs: No Transportation Needs (11/10/2023)   Received from [facility]    In the past 12 months, has lack of reliable transportation kept you from medical appointments, meetings, work or from getting things needed for daily living? : No  Physical Activity: Inactive (05/17/2022)   Exercise Vital Sign    Days of Exercise per Week: 0 days    Minutes of Exercise per Session: 0 min  Stress: No Stress Concern Present (08/10/2023)   [employer] of Occupational Health - Occupational Stress Questionnaire    Feeling of Stress: Not at all  Social Connections: Patient Declined (08/10/2023)   Social Connection and Isolation Panel    Frequency of Communication with Friends and Family: Patient declined    Frequency of Social Gatherings with Friends and Family: Patient declined    Attends Religious Services: Patient declined    Active Member of Clubs or Organizations: Patient declined    Attends Banker Meetings: Patient declined    Marital Status: Patient declined  Depression (PHQ2-9): Low Risk (08/10/2023)   Depression (PHQ2-9)    PHQ-2 Score: 2  Recent Concern: Depression (PHQ2-9) - High Risk (07/07/2023)   Depression (PHQ2-9)    PHQ-2 Score: 19  Alcohol Screen: Low Risk  (08/10/2023)   Alcohol Screen    Last Alcohol Screening Score (AUDIT): 0  Housing: Low Risk (11/10/2023)   Received from [facility]   Epic    What is your living situation today?: I have a steady place to live    Think about the place you live. Do you have problems with any of the following? Choose all that apply:: None/None on this list  Utilities: Low Risk (11/10/2023)   Received from [facility]   Utilities    In the past 12 months has the electric, gas, oil, or water  company threatened to shut off services in your home? : No  Health Literacy: Adequate Health Literacy (08/10/2023)   B1300 Health Literacy    Frequency of need for help with medical instructions: Never    Additional Social History:   Allergies:   Allergies  Allergen Reactions   Alendronate Sodium Other (See Comments)    JAW PAIN   Celecoxib Other (See Comments)    GI UPSET   Chocolate Diarrhea   Omeprazole Nausea Only   Rabeprazole Nausea And Vomiting   Latex Itching, Dermatitis and Rash   Sulfa  Antibiotics Rash and Dermatitis    Metabolic Disorder Labs: Lab Results  Component Value Date   HGBA1C 5.7 07/07/2023   MPG 133 04/25/2007   No results found for: PROLACTIN Lab Results  Component Value Date   CHOL 173 07/07/2023   TRIG 102.0 07/07/2023   HDL 71.40 07/07/2023   CHOLHDL 2 07/07/2023   VLDL 20.4 07/07/2023   LDLCALC 81 07/07/2023   LDLCALC 90 06/29/2022   Lab Results  Component Value Date   TSH 2.69 07/07/2023    Therapeutic Level Labs: No results found for: LITHIUM No results found for: CBMZ No results found for: VALPROATE  Current Medications: Current Outpatient Medications  Medication Sig Dispense Refill   DULoxetine  (CYMBALTA ) 30 MG capsule Take 1 capsule (30 mg total) by mouth daily. 7 capsule 0   acetaminophen  (TYLENOL ) 500 MG tablet Take 2 tablets (1,000 mg total) by mouth every 8 (eight) hours.     alum & mag hydroxide-simeth (MAALOX/MYLANTA) 200-200-20 MG/5ML  suspension Take 30 mLs by mouth every 4 (four)  hours as needed for indigestion.     amoxicillin -clavulanate (AUGMENTIN ) 875-125 MG tablet Take 1 tablet by mouth 2 (two) times daily. 20 tablet 0   APPLE CIDER VINEGAR PO Take 2 tablets by mouth See admin instructions. Chew 2 gummies by mouth at bedtime     atorvastatin  (LIPITOR) 10 MG tablet TAKE 1 TABLET DAILY 90 tablet 2   buPROPion  (WELLBUTRIN  XL) 150 MG 24 hr tablet Take 3 tablets (450 mg total) by mouth in the morning. 90 tablet 4   buPROPion  (WELLBUTRIN  XL) 300 MG 24 hr tablet Take 1 tablet (300 mg total) by mouth daily. 30 tablet 1   calcium  carbonate (TUMS - DOSED IN MG ELEMENTAL CALCIUM ) 500 MG chewable tablet Chew 1 tablet (200 mg of elemental calcium  total) by mouth 4 (four) times daily as needed for indigestion or heartburn. 10 tablet 0   clopidogrel  (PLAVIX ) 75 MG tablet Take 1 tablet (75 mg total) by mouth daily. 90 tablet 3   clopidogrel  (PLAVIX ) 75 MG tablet Take 1 tablet (75 mg total) by mouth daily. 90 tablet 3   diclofenac sodium (VOLTAREN) 1 % GEL Apply 2-4 g topically at bedtime as needed (for pain- affected areas).     DULoxetine  (CYMBALTA ) 60 MG capsule Take 1 capsule (60 mg total) by mouth daily. 30 capsule 2   estradiol  (ESTRACE ) 0.1 MG/GM vaginal cream APPLY A PEA SIZED AMOUNT OF CREAM VAGINALLY TWICE A WEEK 42.5 g 0   famotidine  (PEPCID ) 40 MG tablet TAKE 1 TABLET DAILY 90 tablet 3   fluocinonide  (LIDEX ) 0.05 % external solution Apply small amount topically to scalp twice daily as needed. 60 mL 2   furosemide  (LASIX ) 20 MG tablet TAKE 1 TABLET DAILY 90 tablet 1   furosemide  (LASIX ) 20 MG tablet Take 1 tablet (20 mg total) by mouth in the morning for edema. 30 tablet 0   gabapentin  (NEURONTIN ) 100 MG capsule TAKE 2 CAPSULES THREE TIMES A DAY 540 capsule 0   IMODIUM A-D 2 MG tablet Take 2 mg by mouth 4 (four) times daily as needed for diarrhea or loose stools.     Liniments (ABSORBINE ARTHRITIS STRENGTH EX) Apply 1  application  topically daily as needed (Back pain).     melatonin 3 MG TABS tablet Take 1 tablet (3 mg total) by mouth at bedtime as needed.     methocarbamol  (ROBAXIN ) 500 MG tablet Take 1 tablet (500 mg total) by mouth every 8 (eight) hours as needed for muscle spasms. Caution of sedation 30 tablet 0   methylPREDNISolone  (MEDROL  DOSEPAK) 4 MG TBPK tablet Take As Directed On Package 21 tablet 0   metoprolol  succinate (TOPROL -XL) 25 MG 24 hr tablet TAKE ONE-HALF (1/2) TABLET AT BEDTIME 45 tablet 1   nitroGLYCERIN  (NITROSTAT ) 0.4 MG SL tablet Place 1 tablet (0.4 mg total) under the tongue every 5 (five) minutes as needed for chest pain. 25 tablet 3   pantoprazole  (PROTONIX ) 40 MG tablet Take 1 tablet (40 mg total) by mouth 2 (two) times daily. 180 tablet 1   Polyethyl Glycol-Propyl Glycol (SYSTANE OP) Place 1-2 drops into both eyes 3 (three) times daily as needed (for dryness).     polyethylene glycol (MIRALAX  / GLYCOLAX ) 17 g packet Take 17 g by mouth daily as needed.     predniSONE  (DELTASONE ) 10 MG tablet Take 2 tablets (20 mg total) by mouth daily with breakfast. 60 tablet 3   senna (SENOKOT) 8.6 MG TABS tablet Take 2 tablets (17.2 mg total) by  mouth at bedtime.     traMADol  (ULTRAM ) 50 MG tablet Take 1 tablet (50 mg total) by mouth every 12 (twelve) hours as needed for severe pain (pain score 7-10). Caution of sedation 15 tablet 0   Current Facility-Administered Medications  Medication Dose Route Frequency Provider Last Rate Last Admin   denosumab  (PROLIA ) injection 60 mg  60 mg Subcutaneous Once Tower, Laine LABOR, MD        Musculoskeletal: Strength & Muscle Tone: decreased Gait & Station: unsteady Patient leans: Right  Psychiatric Specialty Exam: Review of Systems  Blood pressure 113/65, pulse 80, height 5' 4 (1.626 m), weight 181 lb (82.1 kg).Body mass index is 31.07 kg/m.  General Appearance: Casual  Eye Contact:  Good  Speech:  Clear and Coherent  Volume:  Normal  Mood:  Anxious   Affect:  Appropriate  Thought Process:  Coherent  Orientation:  Full (Time, Place, and Person)  Thought Content:  WDL  Suicidal Thoughts:  No  Homicidal Thoughts:  No  Memory:  NA  Judgement:  Good  Insight:  Good  Psychomotor Activity:  Decreased  Concentration:    Recall:  Good  Fund of Knowledge:Good  Language: Good  Akathisia:  NA  Handed:  Right  AIMS (if indicated):  not done  Assets:  Desire for Improvement  ADL's:  Intact  Cognition: WNL  Sleep:  Good   Screenings: GAD-7    Flowsheet Row Office Visit from 07/07/2023 in [facility] Office Visit from 08/15/2022 in [facility] [place] [facility] [place] Office Visit from 06/24/2022 in [name]  Total GAD-7 Score 21 21 19    Mini-Mental    Flowsheet Row Clinical Support from 02/09/2018 in [facility] [place] [facility] Clinical Support from 02/08/2017 in [facility] Clinical Support from 12/14/2015 in [facility]  Total Score (max 30 points ) 20 20 20    PHQ2-9    Flowsheet Row Clinical Support from 08/10/2023 in [facility] [place] Office Visit from 07/07/2023 in [facility] [place] Office Visit from 08/15/2022 in [facility] Office Visit from 06/24/2022 in [facility] Clinical Support from 05/17/2022 in [facility] [place]  PHQ-2 Total Score 2 6 0 5 4  PHQ-9 Total Score 2 19 12 16 10    Flowsheet Row ED to Hosp-Admission (Discharged) from 05/07/2023 in [facility] from 05/05/2022 in [facility] Admission (Discharged) from 05/02/2022 in [facility]  C-SSRS RISK CATEGORY No Risk No Risk No Risk    Assessment and Plan:     Today the patient said that she  switched to Cymbalta  and had no problems at all.  Today we will get an increase it from 60 mg to 120 mg for treatment both of her depression and for pain.  Her diagnosis is major depression.  Her second problem is insomnia.  She takes 10 mg of melatonin but organ I also had Atarax 10 mg to her sleep pattern.  Fortunately she is off the prednisone .  She herself will make contact with Dr. Ramo's to try to get better pain control.  It is my hope with better pain control better night of sleep but her mood will be better.  The possibility of talk therapy should  be strongly encouraged on her next visit.  Collaboration of Care:   Patient/Guardian was advised Release of Information must be obtained prior to any record release in order to collaborate their care with an outside provider. Patient/Guardian was advised if they have not already done so to contact the registration department to sign all necessary forms in order for us  to release information regarding their care.   Consent: Patient/Guardian gives verbal consent for treatment and assignment of benefits for services provided during this visit. Patient/Guardian expressed understanding and agreed to proceed.   Elna LILLETTE Lo, MD 12/17/20252:11 PM

## 2024-01-24 ENCOUNTER — Ambulatory Visit: Payer: Self-pay | Admitting: Internal Medicine

## 2024-01-24 NOTE — Telephone Encounter (Signed)
 Copied from CRM [id number]. Topic: Clinical - Red Word Triage >> Jan 24, 2024  3:01 PM Rilla B wrote: Red Word that prompted transfer to Nurse Triage: Shortness of breath   ----------------------------------------------------------------------- From previous Reason for Contact - Scheduling: Patient/patient representative is calling to schedule an appointment. Refer to attachments for appointment information. Reason for Disposition  [1] MODERATE difficulty breathing (e.g., speaks in phrases, SOB at rest, pulse 100 - 120) AND [2] new-onset or worse than normal  Answer Assessment - Initial Assessment Questions Patient's main concern and reason for call today: Need an updated prescription for oxygen  from Dr Geronimo for [employer] (oxygen  company).  She told PAS she is having worsening SOB so she was transferred to nurse triage. Patient states she was prescribed 2L [place] O2 to wear at night by pulmonologist but as of Easter Sunday 2025 she has been having worsening SOB and increased oxygen  needs. She states her O2 will drop into the 80s sometimes and she puts her oxygen  on during the daytime now as well. She states she feels SOB talking and with activity. She states she feel and had a hip fracture back in Easter 2025 and had to have surgery and since then symptoms worsened. She states she was discharged to a rehab facility and was never told or given a prescription/order to wear oxygen  during the day but she just decides to use it more based on her SOB and O2 readings.  No current SpO2 reading but states she checked yesterday morning and it was 90% on room air and that was what she runs at baseline.    RN advised ED due to increasing SOB and O2 demands. Patient declines. Sent call back to PAS for scheduling.  Protocols used: Oxygen  Monitoring and Hypoxia-A-AH

## 2024-02-01 ENCOUNTER — Other Ambulatory Visit (HOSPITAL_COMMUNITY): Payer: Self-pay | Admitting: Psychiatry

## 2024-02-01 ENCOUNTER — Other Ambulatory Visit (HOSPITAL_COMMUNITY): Payer: Self-pay

## 2024-02-02 ENCOUNTER — Other Ambulatory Visit (HOSPITAL_COMMUNITY): Payer: Self-pay | Admitting: Psychiatry

## 2024-02-02 ENCOUNTER — Other Ambulatory Visit (HOSPITAL_COMMUNITY): Payer: Self-pay

## 2024-02-07 ENCOUNTER — Other Ambulatory Visit (HOSPITAL_COMMUNITY): Payer: Self-pay

## 2024-02-07 ENCOUNTER — Other Ambulatory Visit (HOSPITAL_COMMUNITY): Payer: Self-pay | Admitting: *Deleted

## 2024-02-07 MED ORDER — DULOXETINE HCL 60 MG PO CPEP
120.0000 mg | ORAL_CAPSULE | Freq: Every morning | ORAL | 1 refills | Status: AC
  Filled 2024-02-07: qty 60, 30d supply, fill #0

## 2024-02-13 ENCOUNTER — Ambulatory Visit: Admitting: Internal Medicine

## 2024-02-16 ENCOUNTER — Encounter: Payer: Self-pay | Admitting: Cardiovascular Disease

## 2024-02-16 ENCOUNTER — Ambulatory Visit: Attending: Cardiovascular Disease | Admitting: Cardiovascular Disease

## 2024-02-16 VITALS — BP 140/80 | HR 68 | Ht 64.0 in | Wt 183.4 lb

## 2024-02-16 DIAGNOSIS — I5032 Chronic diastolic (congestive) heart failure: Secondary | ICD-10-CM | POA: Insufficient documentation

## 2024-02-16 DIAGNOSIS — R079 Chest pain, unspecified: Secondary | ICD-10-CM | POA: Insufficient documentation

## 2024-02-16 DIAGNOSIS — I700 Atherosclerosis of aorta: Secondary | ICD-10-CM | POA: Diagnosis present

## 2024-02-16 DIAGNOSIS — Z01810 Encounter for preprocedural cardiovascular examination: Secondary | ICD-10-CM | POA: Insufficient documentation

## 2024-02-16 NOTE — Patient Instructions (Signed)
 Medication Instructions:  No medication changes were made at this visit. Continue current regimen.   *If you need a refill on your cardiac medications before your next appointment, please call your pharmacy*  Lab Work: None ordered today. If you have labs (blood work) drawn today and your tests are completely normal, you will receive your results only by: MyChart Message (if you have MyChart) OR A paper copy in the mail If you have any lab test that is abnormal or we need to change your treatment, we will call you to review the results.  Testing/Procedures: Your physician has requested that you have an echocardiogram. Echocardiography is a painless test that uses sound waves to create images of your heart. It provides your doctor with information about the size and shape of your heart and how well your heart's chambers and valves are working. This procedure takes approximately one hour. There are no restrictions for this procedure. Please do NOT wear cologne, perfume, aftershave, or lotions (deodorant is allowed). Please arrive 15 minutes prior to your appointment time.  Please note: We ask at that you not bring children with you during ultrasound (echo/ vascular) testing. Due to room size and safety concerns, children are not allowed in the ultrasound rooms during exams. Our front office staff cannot provide observation of children in our lobby area while testing is being conducted. An adult accompanying a patient to their appointment will only be allowed in the ultrasound room at the discretion of the ultrasound technician under special circumstances. We apologize for any inconvenience.   Follow-Up: At [facility], you and your health needs are our priority.  As part of our continuing mission to provide you with exceptional heart care, our providers are all part of one team.  This team includes your primary Cardiologist (physician) and Advanced Practice Providers or APPs (Physician  Assistants and Nurse Practitioners) who all work together to provide you with the care you need, when you need it.  Your next appointment:   1 year(s)  Provider:   Ozell Fell, MD

## 2024-02-16 NOTE — Assessment & Plan Note (Addendum)
 Treated with statin drug and clopidogrel .

## 2024-02-16 NOTE — Progress Notes (Signed)
 " Cardiology Office Note:    Date:  02/16/2024   ID:  [name] [name], DOB 1934-01-23, MRN [id number]  PCP:  Randeen Laine LABOR, MD   [facility] Providers Cardiologist:  Ozell Fell, MD     Referring MD: Randeen Laine LABOR, MD   Chief Complaint  Patient presents with   Shortness of Breath    History of Present Illness:    [name] [name] is a 89 y.o. female with a hx of:  Palpitations  Hx of TIA Chronic shortness of breath  Myoview  02/2018: low risk  Echocardiogram 02/2018: EF 60-65, Gr 2 DD Echocardiogram 02/2021: EF 60-65, Gr 2 DD, mild to mod TR Heart failure with preserved ejection fraction  Coronary Ca2+ on Chest CT Aortic atherosclerosis  Hyperlipidemia  Hx of CVA MRI 11/21: remote R MCA area infarct DJD  Anxiety  GERD  Macular degeneration  Osteoporosis  Hx of COVID-19 in 12/2018 Long COVID  The patient is here with her daughter and a caregiver today.  She has really been struggling with anxiety and panic attacks.  Last year she had a mechanical fall and hip fracture requiring surgery.  She now is having problems with her vision and is going to require eye surgery to repair a lens.  She continues to complain of shortness of breath with activity.  When she arrived, she was complaining of left-sided chest pain and she was evaluated acutely.  After discussing this with her and examining her, the pain is really around the left lateral chest and it involves the chest wall.  She is tender to palpation and this reproduces her pain.  She has no centralized chest pain or pressure.  She denies edema, orthopnea, or PND.  She remains quite limited by her shortness of breath.  Current Medications: Active Medications[1]   Allergies:   Alendronate sodium, Celecoxib, Chocolate, Omeprazole, Rabeprazole, Latex, and Sulfa  antibiotics   ROS:   Please see the history of present illness.    All other systems reviewed and are negative.  EKGs/Labs/Other Studies Reviewed:     The following studies were reviewed today: Cardiac Studies & Procedures   ______________________________________________________________________________________________   STRESS TESTS  MYOCARDIAL PERFUSION IMAGING 05/20/2021  Interpretation Summary   The study is normal. The study is low risk.   No ST deviation was noted.   Left ventricular function is normal. Nuclear stress EF: 70 %. The left ventricular ejection fraction is hyperdynamic (>65%). End diastolic cavity size is normal. End systolic cavity size is normal.   Prior study available for comparison from 06/24/2015. No changes compared to prior study. Low risk, normal perfusion, LVEF 69%  Normal perfusion. LVEF 70% with normal wall motion. This is a low risk study. No change compared to prior study in 2017.   ECHOCARDIOGRAM  ECHOCARDIOGRAM COMPLETE 03/05/2021  Narrative ECHOCARDIOGRAM REPORT    Patient Name:   [name] [name] Date of Exam: 03/05/2021 Medical Rec #:  [id number]         Height:       65.0 in Accession #:    [id number]        Weight:       199.0 lb Date of Birth:  September 21, 1934        BSA:          1.974 m Patient Age:    86 years          BP:           116/64 mmHg Patient Gender:  F                 HR:           60 bpm. Exam Location:  [address]  Procedure: 2D Echo, Cardiac Doppler and Color Doppler  Indications:    I50.32 CHF  History:        Patient has prior history of Echocardiogram examinations, most recent 01/27/2020. CHF, Signs/Symptoms:Shortness of Breath and Palpitations; Risk Factors:Dyslipidemia and Obesity.  Sonographer:    Elsie Bohr RDCS Referring Phys: [id number] TESSA N CONTE  IMPRESSIONS   1. Left ventricular ejection fraction, by estimation, is 60 to 65%. The left ventricle has normal function. The left ventricle has no regional wall motion abnormalities. There is mild left ventricular hypertrophy. Left ventricular diastolic parameters are consistent with Grade II diastolic  dysfunction (pseudonormalization). 2. Right ventricular systolic function is normal. The right ventricular size is normal. 3. Left atrial size was mildly dilated. 4. The mitral valve is grossly normal. No evidence of mitral valve regurgitation. 5. Tricuspid valve regurgitation is mild to moderate. 6. Aortic valve regurgitation is mild. Aortic valve sclerosis/calcification is present, without any evidence of aortic stenosis. 7. The inferior vena cava is normal in size with greater than 50% respiratory variability, suggesting right atrial pressure of 3 mmHg. 8. Cannot exclude a small PFO.  Comparison(s): No significant change from prior study.  FINDINGS Left Ventricle: Left ventricular ejection fraction, by estimation, is 60 to 65%. The left ventricle has normal function. The left ventricle has no regional wall motion abnormalities. The left ventricular internal cavity size was normal in size. There is mild left ventricular hypertrophy. Left ventricular diastolic parameters are consistent with Grade II diastolic dysfunction (pseudonormalization).  Right Ventricle: The right ventricular size is normal. No increase in right ventricular wall thickness. Right ventricular systolic function is normal.  Left Atrium: Left atrial size was mildly dilated.  Right Atrium: Right atrial size was normal in size.  Pericardium: There is no evidence of pericardial effusion.  Mitral Valve: The mitral valve is grossly normal. No evidence of mitral valve regurgitation.  Tricuspid Valve: The tricuspid valve is grossly normal. Tricuspid valve regurgitation is mild to moderate.  Aortic Valve: Aortic valve regurgitation is mild. Aortic regurgitation PHT measures 503 msec. Aortic valve sclerosis/calcification is present, without any evidence of aortic stenosis.  Pulmonic Valve: The pulmonic valve was not well visualized. Pulmonic valve regurgitation is trivial.  Aorta: The aortic root and ascending aorta are  structurally normal, with no evidence of dilitation.  Venous: The inferior vena cava is normal in size with greater than 50% respiratory variability, suggesting right atrial pressure of 3 mmHg.  IAS/Shunts: Cannot exclude a small PFO.   LEFT VENTRICLE PLAX 2D LVIDd:         4.00 cm   Diastology LVIDs:         2.90 cm   LV e' medial:    7.94 cm/s LV PW:         1.20 cm   LV E/e' medial:  11.8 LV IVS:        1.30 cm   LV e' lateral:   9.68 cm/s LVOT diam:     1.60 cm   LV E/e' lateral: 9.7 LVOT Area:     2.01 cm   RIGHT VENTRICLE             IVC RV S prime:     14.80 cm/s  IVC diam: 1.20 cm TAPSE (M-mode): 2.4 cm RVSP:  33.0 mmHg  LEFT ATRIUM             Index        RIGHT ATRIUM           Index LA diam:        4.50 cm 2.28 cm/m   RA Pressure: 3.00 mmHg LA Vol (A2C):   59.2 ml 29.99 ml/m  RA Area:     13.00 cm LA Vol (A4C):   66.7 ml 33.79 ml/m  RA Volume:   31.00 ml  15.70 ml/m LA Biplane Vol: 66.4 ml 33.64 ml/m AORTIC VALVE AI PHT:      503 msec  AORTA Ao Root diam: 3.70 cm Ao Asc diam:  3.50 cm  MITRAL VALVE               TRICUSPID VALVE MV Area (PHT): 3.27 cm    TR Peak grad:   30.0 mmHg MV Decel Time: 232 msec    TR Vmax:        274.00 cm/s MV E velocity: 93.80 cm/s  Estimated RAP:  3.00 mmHg MV A velocity: 67.10 cm/s  RVSP:           33.0 mmHg MV E/A ratio:  1.40 SHUNTS Systemic Diam: 1.60 cm  [name] [name] Electronically signed by [name] [name] Signature Date/Time: 03/05/2021/12:50:59 PM    Final          ______________________________________________________________________________________________      EKG:   EKG Interpretation Date/Time:  Friday February 16 2024 11:17:13 EST Ventricular Rate:  68 PR Interval:  176 QRS Duration:  84 QT Interval:  394 QTC Calculation: 418 R Axis:   100  Text Interpretation: Normal sinus rhythm Possible Lateral infarct , age undetermined When compared with ECG of 07-May-2023 15:27, PREVIOUS ECG IS  PRESENT No significant change was found Confirmed by Wonda Sharper [phone]) on 02/16/2024 11:27:26 AM    Recent Labs: 07/07/2023: ALT 15; BUN 16; Creatinine, Ser 0.85; Hemoglobin 12.4; Platelets 352.0; Potassium 4.3; Sodium 142; TSH 2.69  Recent Lipid Panel    Component Value Date/Time   CHOL 173 07/07/2023 1142   CHOL 189 06/29/2022 1118   TRIG 102.0 07/07/2023 1142   HDL 71.40 07/07/2023 1142   HDL 79 06/29/2022 1118   CHOLHDL 2 07/07/2023 1142   VLDL 20.4 07/07/2023 1142   LDLCALC 81 07/07/2023 1142   LDLCALC 90 06/29/2022 1118            Physical Exam:    VS:  BP (!) 140/80 (BP Location: Left Arm, Patient Position: Sitting, Cuff Size: Large)   Pulse 68   SpO2 95%     Wt Readings from Last 3 Encounters:  10/17/23 183 lb 9.6 oz (83.3 kg)  08/10/23 160 lb (72.6 kg)  07/07/23 180 lb 8 oz (81.9 kg)     GEN:  Well nourished, well developed in no acute distress but tearful at times during the encounter HEENT: Normal NECK: No JVD; No carotid bruits LYMPHATICS: No lymphadenopathy CARDIAC: RRR, systolic ejection murmur noted at the right upper sternal border RESPIRATORY:  Clear to auscultation without rales, wheezing or rhonchi  ABDOMEN: Soft, non-tender, non-distended MUSCULOSKELETAL:  No edema; No deformity  SKIN: Warm and dry NEUROLOGIC:  Alert and oriented x 3 PSYCHIATRIC:  Normal affect   Assessment & Plan Chronic heart failure with preserved ejection fraction (HCC) Patient with chronic dyspnea, NYHA functional class 3 limitation.  Grade 2 diastolic dysfunction noted on her most recent echo from 2023.  Treated  with losartan and furosemide .  Recommend continue current medical program.  She appears euvolemic on exam.  She is reassured about her cardiac condition.  I do think we should repeat an echocardiogram to reassess LV and RV function as well as the presence of diastolic dysfunction.  Based on her exam, I do not think she has developed any significant valvular  disease. Aortic atherosclerosis Treated with statin drug and clopidogrel . Chest pain at rest The patient's pain is reproducible on exam.  Her EKG shows no acute changes.  Pain is highly atypical for ischemic chest pain.  No further evaluation is indicated at this time. Preop cardiovascular exam I think the patient is at low cardiac risk of surgery.  Her eye surgery overall would be a low risk procedure and the patient does not have any unstable cardiac symptoms.  She can proceed without further cardiac testing.  Overall I think [name] [name] is stable from a cardiac perspective.  She is struggling with anxiety and this seems to be much worse over the past year and related to some of the medical issues she has had to deal with.  I will update an echocardiogram to make sure there is no major changes in her cardiac function and plan to see her back in 1 year for follow-up.      Medication Adjustments/Labs and Tests Ordered: Current medicines are reviewed at length with the patient today.  Concerns regarding medicines are outlined above.  Orders Placed This Encounter  Procedures   EKG 12-Lead   No orders of the defined types were placed in this encounter.   There are no Patient Instructions on file for this visit.   Signed, Ozell Fell, MD  02/16/2024 11:52 AM    [facility]     [1]  Current Meds  Medication Sig   acetaminophen  (TYLENOL ) 500 MG tablet Take 2 tablets (1,000 mg total) by mouth every 8 (eight) hours.   alum & mag hydroxide-simeth (MAALOX/MYLANTA) 200-200-20 MG/5ML suspension Take by mouth every 6 (six) hours as needed for indigestion or heartburn.   atorvastatin  (LIPITOR) 10 MG tablet TAKE 1 TABLET DAILY   buPROPion  (WELLBUTRIN  XL) 300 MG 24 hr tablet Take 1 tablet (300 mg total) by mouth daily.   buPROPion  (WELLBUTRIN  XL) 300 MG 24 hr tablet Take 300 mg by mouth daily.   calcium  carbonate (TUMS - DOSED IN MG ELEMENTAL CALCIUM ) 500 MG chewable tablet Chew  1 tablet (200 mg of elemental calcium  total) by mouth 4 (four) times daily as needed for indigestion or heartburn.   clopidogrel  (PLAVIX ) 75 MG tablet Take 1 tablet (75 mg total) by mouth daily.   diclofenac sodium (VOLTAREN) 1 % GEL Apply 2-4 g topically at bedtime as needed (for pain- affected areas).   DULoxetine  (CYMBALTA ) 60 MG capsule Take 2 capsules (120 mg total) by mouth in the morning. (Patient taking differently: Take 60 mg by mouth in the morning.)   estradiol  (ESTRACE ) 0.1 MG/GM vaginal cream APPLY A PEA SIZED AMOUNT OF CREAM VAGINALLY TWICE A WEEK (Patient taking differently: as needed. APPLY A PEA SIZED AMOUNT OF CREAM VAGINALLY TWICE A WEEK)   famotidine  (PEPCID ) 40 MG tablet TAKE 1 TABLET DAILY   furosemide  (LASIX ) 20 MG tablet Take 1 tablet (20 mg total) by mouth in the morning for edema.   gabapentin  (NEURONTIN ) 100 MG capsule TAKE 2 CAPSULES THREE TIMES A DAY   hydrOXYzine (ATARAX) 10 MG tablet 1  qhs   IMODIUM A-D 2 MG tablet  Take 2 mg by mouth 4 (four) times daily as needed for diarrhea or loose stools.   Liniments (ABSORBINE ARTHRITIS STRENGTH EX) Apply 1 application  topically daily as needed (Back pain).   melatonin 3 MG TABS tablet Take 1 tablet (3 mg total) by mouth at bedtime as needed.   methocarbamol  (ROBAXIN ) 500 MG tablet Take 1 tablet (500 mg total) by mouth every 8 (eight) hours as needed for muscle spasms. Caution of sedation   metoprolol  succinate (TOPROL -XL) 25 MG 24 hr tablet TAKE ONE-HALF (1/2) TABLET AT BEDTIME   nitroGLYCERIN  (NITROSTAT ) 0.4 MG SL tablet Place 1 tablet (0.4 mg total) under the tongue every 5 (five) minutes as needed for chest pain.   pantoprazole  (PROTONIX ) 40 MG tablet Take 1 tablet (40 mg total) by mouth 2 (two) times daily.   Polyethyl Glycol-Propyl Glycol (SYSTANE OP) Place 1-2 drops into both eyes 3 (three) times daily as needed (for dryness).   Current Facility-Administered Medications for the 02/16/24 encounter (Office Visit) with  Wonda Sharper, MD  Medication   denosumab  (PROLIA ) injection 60 mg   "

## 2024-02-22 ENCOUNTER — Other Ambulatory Visit: Payer: Self-pay | Admitting: *Deleted

## 2024-02-22 MED ORDER — PANTOPRAZOLE SODIUM 40 MG PO TBEC: 40.0000 mg | DELAYED_RELEASE_TABLET | Freq: Two times a day (BID) | ORAL | 0 refills | Status: AC

## 2024-02-23 ENCOUNTER — Other Ambulatory Visit (HOSPITAL_COMMUNITY): Payer: Self-pay

## 2024-02-23 MED ORDER — OFLOXACIN 0.3 % OP SOLN
1.0000 [drp] | Freq: Four times a day (QID) | OPHTHALMIC | 3 refills | Status: AC
  Filled 2024-02-23: qty 10, 50d supply, fill #0

## 2024-02-23 MED ORDER — PREDNISOLONE ACETATE 1 % OP SUSP
1.0000 [drp] | Freq: Four times a day (QID) | OPHTHALMIC | 3 refills | Status: AC
  Filled 2024-02-23: qty 15, 75d supply, fill #0

## 2024-02-27 ENCOUNTER — Ambulatory Visit (HOSPITAL_COMMUNITY): Admitting: Psychiatry

## 2024-03-04 ENCOUNTER — Ambulatory Visit (HOSPITAL_COMMUNITY)

## 2024-03-05 ENCOUNTER — Ambulatory Visit: Admitting: Internal Medicine

## 2024-03-12 ENCOUNTER — Ambulatory Visit (HOSPITAL_COMMUNITY): Admitting: Psychiatry

## 2024-04-01 ENCOUNTER — Ambulatory Visit (HOSPITAL_COMMUNITY)

## 2024-08-13 ENCOUNTER — Ambulatory Visit

## 2024-08-14 ENCOUNTER — Ambulatory Visit
